# Patient Record
Sex: Female | Born: 1945
Health system: Southern US, Community
[De-identification: ages and names within clinical notes are randomized; demographics above are authoritative.]

## PROBLEM LIST (undated history)

## (undated) DIAGNOSIS — M109 Gout, unspecified: Secondary | ICD-10-CM

## (undated) DIAGNOSIS — G4730 Sleep apnea, unspecified: Secondary | ICD-10-CM

## (undated) DIAGNOSIS — R0600 Dyspnea, unspecified: Secondary | ICD-10-CM

## (undated) DIAGNOSIS — K5900 Constipation, unspecified: Secondary | ICD-10-CM

## (undated) DIAGNOSIS — M1990 Unspecified osteoarthritis, unspecified site: Secondary | ICD-10-CM

## (undated) DIAGNOSIS — I10 Essential (primary) hypertension: Secondary | ICD-10-CM

## (undated) DIAGNOSIS — K219 Gastro-esophageal reflux disease without esophagitis: Secondary | ICD-10-CM

## (undated) DIAGNOSIS — J3489 Other specified disorders of nose and nasal sinuses: Secondary | ICD-10-CM

## (undated) DIAGNOSIS — G709 Myoneural disorder, unspecified: Secondary | ICD-10-CM

## (undated) DIAGNOSIS — N189 Chronic kidney disease, unspecified: Secondary | ICD-10-CM

## (undated) HISTORY — DX: Chronic kidney disease, unspecified: N18.9

## (undated) HISTORY — PX: UVULOPALATOPHARYNGOPLASTY: SHX827

## (undated) HISTORY — PX: TONSILLECTOMY: SUR1361

## (undated) HISTORY — DX: Gastro-esophageal reflux disease without esophagitis: K21.9

## (undated) HISTORY — PX: CARPAL TUNNEL RELEASE: SHX101

## (undated) HISTORY — DX: Constipation, unspecified: K59.00

## (undated) HISTORY — PX: KNEE SURGERY: SHX244

## (undated) HISTORY — PX: ROTATOR CUFF REPAIR: SHX139

## (undated) HISTORY — PX: BACK SURGERY: SHX140

## (undated) HISTORY — PX: TRIGGER FINGER RELEASE: SHX641

---

## 1993-12-20 HISTORY — PX: ESOPHAGOGASTRODUODENOSCOPY: SHX1529

## 1993-12-20 HISTORY — PX: COLONOSCOPY: SHX174

## 2001-10-06 ENCOUNTER — Encounter: Payer: Self-pay | Admitting: Otolaryngology

## 2001-10-06 ENCOUNTER — Encounter (INDEPENDENT_AMBULATORY_CARE_PROVIDER_SITE_OTHER): Payer: Self-pay | Admitting: *Deleted

## 2001-10-06 ENCOUNTER — Ambulatory Visit (HOSPITAL_COMMUNITY): Admission: RE | Admit: 2001-10-06 | Discharge: 2001-10-07 | Payer: Self-pay | Admitting: Otolaryngology

## 2002-01-05 ENCOUNTER — Ambulatory Visit (HOSPITAL_BASED_OUTPATIENT_CLINIC_OR_DEPARTMENT_OTHER): Admission: RE | Admit: 2002-01-05 | Discharge: 2002-01-05 | Payer: Self-pay | Admitting: Otolaryngology

## 2002-06-11 ENCOUNTER — Other Ambulatory Visit: Admission: RE | Admit: 2002-06-11 | Discharge: 2002-06-11 | Payer: Self-pay | Admitting: *Deleted

## 2002-06-21 ENCOUNTER — Other Ambulatory Visit: Admission: RE | Admit: 2002-06-21 | Discharge: 2002-06-21 | Payer: Self-pay | Admitting: *Deleted

## 2002-10-24 ENCOUNTER — Other Ambulatory Visit: Admission: RE | Admit: 2002-10-24 | Discharge: 2002-10-24 | Payer: Self-pay | Admitting: *Deleted

## 2002-12-14 ENCOUNTER — Ambulatory Visit (HOSPITAL_COMMUNITY): Admission: RE | Admit: 2002-12-14 | Discharge: 2002-12-14 | Payer: Self-pay | Admitting: *Deleted

## 2002-12-14 ENCOUNTER — Encounter (INDEPENDENT_AMBULATORY_CARE_PROVIDER_SITE_OTHER): Payer: Self-pay

## 2003-06-14 ENCOUNTER — Other Ambulatory Visit: Admission: RE | Admit: 2003-06-14 | Discharge: 2003-06-14 | Payer: Self-pay | Admitting: *Deleted

## 2005-02-15 ENCOUNTER — Ambulatory Visit: Payer: Self-pay | Admitting: Internal Medicine

## 2005-02-15 ENCOUNTER — Ambulatory Visit (HOSPITAL_COMMUNITY): Admission: RE | Admit: 2005-02-15 | Discharge: 2005-02-15 | Payer: Self-pay | Admitting: Internal Medicine

## 2005-02-15 HISTORY — PX: COLONOSCOPY: SHX174

## 2005-11-10 ENCOUNTER — Other Ambulatory Visit: Admission: RE | Admit: 2005-11-10 | Discharge: 2005-11-10 | Payer: Self-pay | Admitting: Family Medicine

## 2006-02-27 ENCOUNTER — Emergency Department (HOSPITAL_COMMUNITY): Admission: EM | Admit: 2006-02-27 | Discharge: 2006-02-27 | Payer: Self-pay | Admitting: Family Medicine

## 2007-07-09 ENCOUNTER — Encounter: Admission: RE | Admit: 2007-07-09 | Discharge: 2007-07-09 | Payer: Self-pay | Admitting: Orthopaedic Surgery

## 2008-01-19 ENCOUNTER — Emergency Department (HOSPITAL_COMMUNITY): Admission: EM | Admit: 2008-01-19 | Discharge: 2008-01-19 | Payer: Self-pay | Admitting: Family Medicine

## 2008-07-23 ENCOUNTER — Ambulatory Visit (HOSPITAL_COMMUNITY): Admission: RE | Admit: 2008-07-23 | Discharge: 2008-07-23 | Payer: Self-pay | Admitting: Family Medicine

## 2008-09-10 ENCOUNTER — Encounter: Admission: RE | Admit: 2008-09-10 | Discharge: 2008-09-10 | Payer: Self-pay | Admitting: Orthopaedic Surgery

## 2009-07-02 ENCOUNTER — Ambulatory Visit (HOSPITAL_BASED_OUTPATIENT_CLINIC_OR_DEPARTMENT_OTHER): Admission: RE | Admit: 2009-07-02 | Discharge: 2009-07-02 | Payer: Self-pay | Admitting: *Deleted

## 2010-01-10 ENCOUNTER — Encounter: Admission: RE | Admit: 2010-01-10 | Discharge: 2010-01-10 | Payer: Self-pay | Admitting: Family Medicine

## 2010-01-29 ENCOUNTER — Encounter: Admission: RE | Admit: 2010-01-29 | Discharge: 2010-02-26 | Payer: Self-pay | Admitting: Neurosurgery

## 2010-06-24 DIAGNOSIS — D649 Anemia, unspecified: Secondary | ICD-10-CM | POA: Insufficient documentation

## 2010-06-25 ENCOUNTER — Ambulatory Visit: Payer: Self-pay | Admitting: Internal Medicine

## 2010-06-25 DIAGNOSIS — Z860100 Personal history of colon polyps, unspecified: Secondary | ICD-10-CM | POA: Insufficient documentation

## 2010-06-29 ENCOUNTER — Ambulatory Visit: Payer: Self-pay | Admitting: Internal Medicine

## 2010-06-29 ENCOUNTER — Encounter: Payer: Self-pay | Admitting: Gastroenterology

## 2010-07-08 ENCOUNTER — Encounter: Payer: Self-pay | Admitting: Internal Medicine

## 2010-07-10 ENCOUNTER — Telehealth (INDEPENDENT_AMBULATORY_CARE_PROVIDER_SITE_OTHER): Payer: Self-pay | Admitting: *Deleted

## 2010-07-10 ENCOUNTER — Ambulatory Visit: Payer: Self-pay | Admitting: Internal Medicine

## 2010-07-10 ENCOUNTER — Ambulatory Visit (HOSPITAL_COMMUNITY): Admission: RE | Admit: 2010-07-10 | Discharge: 2010-07-10 | Payer: Self-pay | Admitting: Internal Medicine

## 2010-07-10 HISTORY — PX: COLONOSCOPY: SHX174

## 2010-07-14 ENCOUNTER — Encounter: Payer: Self-pay | Admitting: Internal Medicine

## 2010-11-02 ENCOUNTER — Ambulatory Visit (HOSPITAL_COMMUNITY): Admission: RE | Admit: 2010-11-02 | Discharge: 2010-11-02 | Payer: Self-pay | Admitting: Family Medicine

## 2010-11-11 ENCOUNTER — Ambulatory Visit: Payer: Self-pay | Admitting: Internal Medicine

## 2010-11-11 ENCOUNTER — Encounter: Payer: Self-pay | Admitting: Gastroenterology

## 2010-11-11 DIAGNOSIS — R740 Nonspecific elevation of levels of transaminase and lactic acid dehydrogenase [LDH]: Secondary | ICD-10-CM

## 2010-11-11 DIAGNOSIS — R1011 Right upper quadrant pain: Secondary | ICD-10-CM | POA: Insufficient documentation

## 2010-11-11 DIAGNOSIS — K760 Fatty (change of) liver, not elsewhere classified: Secondary | ICD-10-CM | POA: Insufficient documentation

## 2010-11-11 DIAGNOSIS — R7401 Elevation of levels of liver transaminase levels: Secondary | ICD-10-CM | POA: Insufficient documentation

## 2010-11-11 DIAGNOSIS — R109 Unspecified abdominal pain: Secondary | ICD-10-CM | POA: Insufficient documentation

## 2010-11-13 ENCOUNTER — Ambulatory Visit (HOSPITAL_COMMUNITY): Admission: RE | Admit: 2010-11-13 | Discharge: 2010-11-13 | Payer: Self-pay | Admitting: Internal Medicine

## 2010-11-16 ENCOUNTER — Encounter: Payer: Self-pay | Admitting: Internal Medicine

## 2010-11-17 ENCOUNTER — Encounter: Payer: Self-pay | Admitting: Gastroenterology

## 2010-11-17 LAB — CONVERTED CEMR LAB
AST: 54 units/L — ABNORMAL HIGH (ref 0–37)
Albumin: 4.2 g/dL (ref 3.5–5.2)
Alkaline Phosphatase: 65 units/L (ref 39–117)
Eosinophils Relative: 4 % (ref 0–5)
HCT: 38.8 % (ref 36.0–46.0)
Hemoglobin: 12.6 g/dL (ref 12.0–15.0)
Indirect Bilirubin: 0.2 mg/dL (ref 0.0–0.9)
Lymphocytes Relative: 31 % (ref 12–46)
Lymphs Abs: 2 10*3/uL (ref 0.7–4.0)
Platelets: 397 10*3/uL (ref 150–400)
Saturation Ratios: 30 % (ref 20–55)
Total Bilirubin: 0.3 mg/dL (ref 0.3–1.2)
WBC: 6.4 10*3/uL (ref 4.0–10.5)

## 2010-11-18 ENCOUNTER — Encounter: Payer: Self-pay | Admitting: Internal Medicine

## 2010-11-24 ENCOUNTER — Encounter (HOSPITAL_COMMUNITY)
Admission: RE | Admit: 2010-11-24 | Discharge: 2010-12-24 | Payer: Self-pay | Source: Home / Self Care | Attending: Internal Medicine | Admitting: Internal Medicine

## 2011-01-08 ENCOUNTER — Encounter
Admission: RE | Admit: 2011-01-08 | Discharge: 2011-01-08 | Payer: Self-pay | Source: Home / Self Care | Attending: Orthopaedic Surgery | Admitting: Orthopaedic Surgery

## 2011-01-19 NOTE — Progress Notes (Signed)
Summary: call cell number  Phone Note From Other Clinic   Summary of Call: Pamala Hurry from Short Stay called to let us know to call pt on her cell number [phone] when her results come back. Initial call taken by: Zeb Comfort,  July 10, 2010 3:25 PM

## 2011-01-19 NOTE — Letter (Signed)
Summary: Patient Notice, Colon Biopsy Results  [facility]  [address]   [place], [place] [address]   Phone: [phone]  Fax: [phone]       July 14, 2010   [name] [id number] [name] [place], [place]  [address] [birth year]    Dear Ms. Cheryl [name],  I am pleased to inform you that the biopsies taken during your recent colonoscopy did not show any evidence of cancer upon pathologic examination.  Additional information/recommendations:  No further action is needed at this time.  Please follow-up with your primary care physician for your other healthcare needs.  You should have a repeat colonoscopy examination  in 5 years.  Please call us if you are having persistent problems or have questions about your condition that have not been fully answered at this time.  Sincerely,    R. Garfield Cornea MD, [facility] Ph: [phone]    Fax: [phone]   Appended Document: Patient Notice, Colon Biopsy Results Letter mailed to pt.  Appended Document: Patient Notice, Colon Biopsy Results reminder in computer

## 2011-01-19 NOTE — Letter (Signed)
Summary: RAD REPORT [place] ABD COM  RAD REPORT [place] ABD COM   Imported By: Hoy Morn 11/16/2010 16:03:10  _____________________________________________________________________  External Attachment:    Type:   Image     Comment:   External Document

## 2011-01-19 NOTE — Miscellaneous (Signed)
Summary: Orders Update  Clinical Lists Changes  Orders: Added new Test order of T-Hepatic Function ([id number]) - Signed 

## 2011-01-19 NOTE — Assessment & Plan Note (Signed)
Summary: DROPPED OFF STOOL/SS  pt returned ifobt, it was positive. She stated that she has not had a good bowel movement in a couple of days and her hemorroids are bleeding right now.  Allergies: 1)  ! Asa 2)  ! Celebrex 3)  ! * Prednisone /for Extended Period of Time  Appended Document: Orders Update    Clinical Lists Changes  Orders: Added new Service order of Immuno-chemical Fecal Occult [id number]) - Signed      Appended Document: DROPPED OFF STOOL/SS schedule tcs/hemocult positive  Appended Document: DROPPED OFF STOOL/SS TCS scheduled for 07/10/10- pt aware - cdg

## 2011-01-19 NOTE — Letter (Signed)
Summary: TCS Orders   TCS Orders   Imported By: Sherry Ruffing 07/08/2010 11:58:12  _____________________________________________________________________  External Attachment:    Type:   Image     Comment:   External Document

## 2011-01-19 NOTE — Letter (Signed)
Summary: referral from dr Orson Ape  referral from dr Orson Ape   Imported By: Orma Flaming 06/29/2010 15:23:35  _____________________________________________________________________  External Attachment:    Type:   Image     Comment:   External Document

## 2011-01-19 NOTE — Assessment & Plan Note (Signed)
Summary: CONSULT FOR TCS,HX OF POLYPS,HEMORRHOIDS/SS   Visit Type:  Initial Consult Referring Provider:  Smitty Knudsen Primary Care Provider:  Smitty Knudsen  Chief Complaint:  hemmorhoids/ TCS.  History of Present Illness: Cheryl [name] is a pleasant 65 y/o AA female, patient of [employer], who presents for further f/u TCS. She was advised by PCP that it was time for f/u TCS due to her h/o polyps. Dr. Laural Golden performed last TCS in 2006. Patient had several hyperplastic polyps. F/U planned for 2016 based on pathology of the polyps.   Patient denies constipation, melena, brbpr, abd pain, n/v, weight loss, dysphagia. Heartburn well-controlled.   Recent Hgb 11.6. Heme negative X 3.   Current Medications (verified): 1)  Calcium .... Take 1 Tablet By Mouth Once A Day 2)  Evista .... Take 1 Tablet By Mouth Once A Day 3)  Omeprazole .... Take 1 Tablet By Mouth Once A Day 4)  Biotin .... Take 1 Tablet By Mouth Once A Day 5)  Hydrocodone .... As Needed  Allergies (verified): 1)  ! Asa 2)  ! Celebrex 3)  ! * Prednisone /for Extended Period of Time  Past History:  Past Medical History: H/O colonic polyps. TCS 3/06, Dr. Laural Golden small ext hemorrhoids, several hyperplastic polyps. Advised for TCS 2016. EGD/TCS 1995--> Three tiny polyps, path showed chronic colitis, gastritis Anemia, mild  Hypothyroidism GERD Hepatitis, in seventh grade. Subsequent test "okay" per patient.  Past Surgical History: Right carpel tunnel release and trigger finger.  Trigger finger, thumb Rotator cuff  Two knee surgeries Tubal Ligation  Family History: No FH of CRC, colon polyps.  Social History: Married. Two children. 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   Review of Systems General:  Denies fever, chills, sweats, anorexia, fatigue, weakness, and weight loss. Eyes:  Denies vision loss. ENT:  Denies nasal congestion, sore throat, hoarseness, and difficulty swallowing. CV:  Denies  chest pains, angina, palpitations, dyspnea on exertion, and peripheral edema. Resp:  Denies dyspnea at rest, dyspnea with exercise, cough, sputum, and wheezing. GI:  See HPI. GU:  Denies urinary burning and blood in urine. MS:  right trigger finger and carpel tunnel surgery on tuesday. Derm:  Denies rash and itching. Neuro:  Denies weakness, frequent headaches, memory loss, and confusion. Psych:  Denies depression and anxiety. Endo:  Denies unusual weight change. Heme:  Denies bruising and bleeding. Allergy:  Denies hives and rash.  Vital Signs:  Patient profile:   65 year old female Height:      59 inches Weight:      238 pounds BMI:     48.24 Temp:     98.3 degrees F oral Pulse rate:   80 / minute BP sitting:   148 / 82  (left arm) Cuff size:   large  Vitals Entered By: Cheryl Merl LPN (July  7, 624THL QA348G PM)  Physical Exam  General:  Well developed, well nourished, no acute distress.obese.   Head:  Normocephalic and atraumatic. Eyes:  sclera nonicteric Mouth:  Oropharyngeal mucosa moist, pink.  No lesions, erythema or exudate.    Lungs:  Clear throughout to auscultation. Heart:  Regular rate and rhythm; no murmurs, rubs,  or bruits. Abdomen:  Bowel sounds normal.  Abdomen is soft, nontender, nondistended.  No rebound or guarding.  No hepatosplenomegaly, masses or hernias.  No abdominal bruits. obese.   Extremities:  No LE edema. Neurologic:  Alert and  oriented x4;  grossly normal neurologically. Skin:  Intact without  significant lesions or rashes. Cervical Nodes:  No significant cervical adenopathy. Psych:  Alert and cooperative. Normal mood and affect.  Impression & Recommendations:  Problem # 1:  COLONIC POLYPS, HYPERPLASTIC, HX OF (ICD-V12.72)  No need for five year f/u colonoscopy for hyperplastic polyps. No FH of CRC or colon polyps. She has mild anemia. Heme neg X 3. Patient in agreement if no colonoscopy needed right now.   Recommend ifobt. If positive, then  TCS now.  Will discuss above with Dr. Gala Romney.  Orders: Consultation Level III [id number]) I would like to thank [facility] for allowing [place] to take part in the care of this nice patient.

## 2011-01-19 NOTE — Letter (Signed)
Summary: RAD ORDER CT W/IV & ORAL CM  RAD ORDER CT W/IV & ORAL CM   Imported By: Hoy Morn 11/11/2010 11:41:03  _____________________________________________________________________  External Attachment:    Type:   Image     Comment:   External Document  Appended Document: RAD ORDER CT W/IV & ORAL CM [id number]

## 2011-01-19 NOTE — Letter (Signed)
Summary: REFERRAL FROM [place] FROM [facility]   Imported By: Hoy Morn 11/16/2010 16:01:43  _____________________________________________________________________  External Attachment:    Type:   Image     Comment:   External Document

## 2011-01-19 NOTE — Letter (Signed)
Summary: HIDA SCAN ORDER  HIDA SCAN ORDER   Imported By: Sofie Rower 11/18/2010 09:17:37  _____________________________________________________________________  External Attachment:    Type:   Image     Comment:   External Document

## 2011-01-20 ENCOUNTER — Encounter (INDEPENDENT_AMBULATORY_CARE_PROVIDER_SITE_OTHER): Payer: Self-pay | Admitting: *Deleted

## 2011-01-21 NOTE — Assessment & Plan Note (Signed)
Summary: abd pain,fatty liver/enlarged liver/ss   Visit Type:  Initial Consult Referring Provider:  Delman [name] Primary Care Provider:  Delman Cheadle, [name]  CC:  abd pain, fatty liver, and enlarged liver.  History of Present Illness: Cheryl [name] is here for further evaluation of RUQ pain, enlarged liver/fatty liver, abnormal LFTs at request of her PCP, Cheryl [name].   She was last seen over the summer for TCS. She had heme positive stool and mild anemia. See PMH for details of TCS.  C/O RUQ pain since 8/11 but worse in the last month. Pain over RUQ with U/S. Intermittent sharp pain and also dull pain at times. Not related to meals. Sometimes wakes up at night. BM regular. No melena, brbpr. No heartburn, n/v. Takes Hydrocodone once every two to three weeks. Takes for carpel tunnel. Gives h/o bulging disc in mid to low back.  Labs 10/24/10: BUN 14, Cre 0.92, T bili 0.3, AP 69, AST 49H, ALT 29, alb 4.6, lipase 27 Abd U/S 01/02/10: normal gb, CBD 29mm, Hepatomegaly at 19.9cm, increased echogenicity of the liver. Fatty liver.   Current Medications (verified): 1)  Calcium .... Take 1 Tablet By Mouth Once A Day 2)  Evista .... Take 1 Tablet By Mouth Once A Day 3)  Omeprazole .... Take 1 Tablet By Mouth Once A Day 4)  Biotin .... Take 1 Tablet By Mouth Once A Day 5)  Hydrocodone .... As Needed  Allergies (verified): 1)  ! Asa 2)  ! Celebrex 3)  ! * Prednisone /for Extended Period of Time  Past History:  Past Surgical History: Last updated: 06/25/2010 Right carpel tunnel release and trigger finger.  Trigger finger, thumb Rotator cuff  Two knee surgeries Tubal Ligation  Social History: Last updated: 06/25/2010 Married. Two children. 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   Past Medical History: H/O colonic polyps. TCS 3/06, Cheryl [name] small ext hemorrhoids, several hyperplastic polyps. Advised for TCS 2016. EGD/TCS 1995--> Three tiny polyps, path showed  chronic colitis, gastritis Anemia, mild  Hypothyroidism GERD Hepatitis, in seventh grade. Subsequent test "okay" per patient. TCS 7/11-->Normal rectum. Long redundant colon, polyps in the sigmoid, descending and hepatic flexure segments ablated, biopsied (adenomatous)/ removed or snare removed. Next TCS due 06/2015.   Family History: No FH of CRC, colon polyps. 2 sisters with DM No FH of liver disease.   Review of Systems General:  Denies fever, chills, sweats, anorexia, fatigue, weakness, and weight loss. Eyes:  Denies vision loss. ENT:  Denies nasal congestion, sore throat, hoarseness, and difficulty swallowing. CV:  Denies chest pains, angina, palpitations, dyspnea on exertion, and peripheral edema. Resp:  Denies dyspnea at rest, dyspnea with exercise, cough, and sputum. GI:  See HPI. GU:  Denies urinary burning and blood in urine. MS:  Complains of low back pain; denies joint pain / LOM. Derm:  Denies rash and itching. Neuro:  Denies weakness, frequent headaches, memory loss, and confusion. Psych:  Denies depression and anxiety. Endo:  Denies unusual weight change. Heme:  Denies bruising and bleeding. Allergy:  Denies hives and rash.  Vital Signs:  Patient profile:   65 year old female Height:      59 inches Weight:      239 pounds BMI:     48.45 Temp:     98.8 degrees F oral Pulse rate:   64 / minute BP sitting:   138 / 90  (left arm) Cuff size:   large  Vitals Entered By: Cheryl Merl  LPN (November 23, 624THL 10:44 AM)  Physical Exam  General:  Well developed, well nourished, no acute distress.obese.   Head:  Normocephalic and atraumatic. Eyes:  sclera nonicteric Mouth:  op moist Neck:  Supple; no masses or thyromegaly. Lungs:  Clear throughout to auscultation. Heart:  Regular rate and rhythm; no murmurs, rubs,  or bruits. Abdomen:  Obese. Soft. Positive BS. Mild tenderness in RUQ extending to right mid-abd and right flank. Also with pain on palpation of right  rib cage. No CVA tenderness. No rash.  Extremities:  No clubbing, cyanosis, edema or deformities noted. Neurologic:  Alert and  oriented x4;  grossly normal neurologically. Skin:  Intact without significant lesions or rashes. Cervical Nodes:  No significant cervical adenopathy. Psych:  Alert and cooperative. Normal mood and affect.  Impression & Recommendations:  Problem # 1:  RUQ PAIN (ICD-789.01) RUQ pain unlikely related to hepatomegaly or elevated LFTs. Really do not get postprandial component making biliary etiology unlikely as well. I suspect this will be more musculoskeletal in nature. Having said that, will check labs and CT A/P with IV/oral contrast. If CT neg, consider HIDA prior to w/u of musculoskeletal cause.  Orders: T-CBC w/Diff [id number]) T-Hepatic Function [phone]) T-Ferritin [phone]) T-Hepatitis B Surface Antigen [id number]) T-Hepatitis C Antibody [id number]) Consultation Level III [id number])  Problem # 2:  FATTY LIVER DISEASE (ICD-571.8)  Fatty liver and hepatomegaly with mildly elevated AST. Discussed need for slow gradual weight loss and anaerobic exercise. Further w/u as in #1. If all negative, then will provide patient with more information regarding fatty liver and recheck her lfts in 6 months.  Orders: Consultation Level III [phone])  Other Orders: T-Iron [phone]) T-Iron Binding Capacity (TIBC) ([id number]) I would like to thank Dr. Hilma Favors for allowing [place] to take part in the care of this nice patient.

## 2011-01-27 ENCOUNTER — Ambulatory Visit: Payer: BC Managed Care – PPO | Attending: Orthopaedic Surgery | Admitting: Physical Therapy

## 2011-01-27 DIAGNOSIS — M25519 Pain in unspecified shoulder: Secondary | ICD-10-CM | POA: Insufficient documentation

## 2011-01-27 DIAGNOSIS — M25619 Stiffness of unspecified shoulder, not elsewhere classified: Secondary | ICD-10-CM | POA: Insufficient documentation

## 2011-01-27 DIAGNOSIS — IMO0001 Reserved for inherently not codable concepts without codable children: Secondary | ICD-10-CM | POA: Insufficient documentation

## 2011-01-27 NOTE — Letter (Signed)
Summary: Recall Office Visit  [facility]  [address]   [place], [place] [address]   Phone: [phone]  Fax: [phone]      January 20, 2011   Cheryl [name] [id number] [name] [place], [place]  [address] Jan 24, 1946   Dear [name],   According to our records, it is time for you to schedule a follow-up office visit with [place].   At your convenience, please call [phone] to schedule an office visit. If you have any questions, concerns, or feel that this letter is in error, we would appreciate your call.   Sincerely,    [facility] Ph: [phone]   Fax: [phone]

## 2011-02-01 ENCOUNTER — Ambulatory Visit: Payer: BC Managed Care – PPO | Admitting: Physical Therapy

## 2011-02-03 ENCOUNTER — Ambulatory Visit: Payer: BC Managed Care – PPO | Admitting: Physical Therapy

## 2011-02-08 ENCOUNTER — Ambulatory Visit: Payer: BC Managed Care – PPO | Admitting: Physical Therapy

## 2011-02-10 ENCOUNTER — Ambulatory Visit: Payer: BC Managed Care – PPO | Admitting: Physical Therapy

## 2011-02-15 ENCOUNTER — Ambulatory Visit: Payer: BC Managed Care – PPO | Admitting: Physical Therapy

## 2011-02-17 ENCOUNTER — Ambulatory Visit: Payer: BC Managed Care – PPO | Admitting: Physical Therapy

## 2011-02-22 ENCOUNTER — Ambulatory Visit: Payer: BC Managed Care – PPO | Attending: Family Medicine | Admitting: Physical Therapy

## 2011-02-22 DIAGNOSIS — IMO0001 Reserved for inherently not codable concepts without codable children: Secondary | ICD-10-CM | POA: Insufficient documentation

## 2011-02-22 DIAGNOSIS — M25619 Stiffness of unspecified shoulder, not elsewhere classified: Secondary | ICD-10-CM | POA: Insufficient documentation

## 2011-02-22 DIAGNOSIS — M25519 Pain in unspecified shoulder: Secondary | ICD-10-CM | POA: Insufficient documentation

## 2011-02-25 ENCOUNTER — Ambulatory Visit: Payer: BC Managed Care – PPO | Admitting: Physical Therapy

## 2011-03-01 ENCOUNTER — Ambulatory Visit: Payer: BC Managed Care – PPO | Admitting: Physical Therapy

## 2011-03-03 ENCOUNTER — Ambulatory Visit: Payer: BC Managed Care – PPO | Admitting: Physical Therapy

## 2011-03-08 ENCOUNTER — Encounter: Payer: BC Managed Care – PPO | Admitting: Physical Therapy

## 2011-03-08 ENCOUNTER — Ambulatory Visit: Payer: BC Managed Care – PPO | Admitting: Physical Therapy

## 2011-03-10 ENCOUNTER — Ambulatory Visit: Payer: BC Managed Care – PPO | Admitting: Physical Therapy

## 2011-03-15 ENCOUNTER — Encounter: Payer: BC Managed Care – PPO | Admitting: Physical Therapy

## 2011-03-15 ENCOUNTER — Ambulatory Visit: Payer: BC Managed Care – PPO | Admitting: Physical Therapy

## 2011-03-17 ENCOUNTER — Ambulatory Visit: Payer: BC Managed Care – PPO | Admitting: Physical Therapy

## 2011-03-22 ENCOUNTER — Ambulatory Visit: Payer: BC Managed Care – PPO | Attending: Orthopaedic Surgery | Admitting: Physical Therapy

## 2011-03-22 DIAGNOSIS — M25619 Stiffness of unspecified shoulder, not elsewhere classified: Secondary | ICD-10-CM | POA: Insufficient documentation

## 2011-03-22 DIAGNOSIS — IMO0001 Reserved for inherently not codable concepts without codable children: Secondary | ICD-10-CM | POA: Insufficient documentation

## 2011-03-22 DIAGNOSIS — M25519 Pain in unspecified shoulder: Secondary | ICD-10-CM | POA: Insufficient documentation

## 2011-03-24 ENCOUNTER — Ambulatory Visit: Payer: BC Managed Care – PPO | Admitting: Physical Therapy

## 2011-03-28 LAB — POCT HEMOGLOBIN-HEMACUE: Hemoglobin: 12.6 g/dL (ref 12.0–15.0)

## 2011-03-29 ENCOUNTER — Ambulatory Visit: Payer: BC Managed Care – PPO | Admitting: Physical Therapy

## 2011-03-31 ENCOUNTER — Ambulatory Visit: Payer: BC Managed Care – PPO | Admitting: Physical Therapy

## 2011-04-05 ENCOUNTER — Encounter: Payer: BC Managed Care – PPO | Admitting: Physical Therapy

## 2011-04-05 ENCOUNTER — Ambulatory Visit: Payer: BC Managed Care – PPO | Admitting: Physical Therapy

## 2011-04-07 ENCOUNTER — Ambulatory Visit: Payer: BC Managed Care – PPO | Admitting: Physical Therapy

## 2011-04-12 ENCOUNTER — Encounter: Payer: BC Managed Care – PPO | Admitting: Physical Therapy

## 2011-04-13 ENCOUNTER — Ambulatory Visit: Payer: BC Managed Care – PPO | Admitting: Physical Therapy

## 2011-04-14 ENCOUNTER — Encounter: Payer: BC Managed Care – PPO | Admitting: Physical Therapy

## 2011-04-14 ENCOUNTER — Ambulatory Visit: Payer: BC Managed Care – PPO | Admitting: Physical Therapy

## 2011-04-20 ENCOUNTER — Ambulatory Visit: Payer: BC Managed Care – PPO | Attending: Orthopaedic Surgery | Admitting: Physical Therapy

## 2011-04-20 DIAGNOSIS — M25619 Stiffness of unspecified shoulder, not elsewhere classified: Secondary | ICD-10-CM | POA: Insufficient documentation

## 2011-04-20 DIAGNOSIS — IMO0001 Reserved for inherently not codable concepts without codable children: Secondary | ICD-10-CM | POA: Insufficient documentation

## 2011-04-20 DIAGNOSIS — M25519 Pain in unspecified shoulder: Secondary | ICD-10-CM | POA: Insufficient documentation

## 2011-04-21 ENCOUNTER — Ambulatory Visit: Payer: BC Managed Care – PPO | Admitting: Physical Therapy

## 2011-04-26 ENCOUNTER — Ambulatory Visit: Payer: BC Managed Care – PPO | Admitting: Physical Therapy

## 2011-04-28 ENCOUNTER — Ambulatory Visit: Payer: BC Managed Care – PPO | Admitting: Physical Therapy

## 2011-05-03 ENCOUNTER — Ambulatory Visit: Payer: BC Managed Care – PPO | Admitting: Physical Therapy

## 2011-05-04 NOTE — Op Note (Signed)
NAMEHASET, MONACHINO NO.:  [id number]   MEDICAL RECORD NO.:  [id number]          PATIENT TYPE:  AMB   LOCATION:  [place]                          FACILITY:  [facility]   PHYSICIAN:  Daryl Eastern, M.D.DATE OF BIRTH:  1946-08-27   DATE OF PROCEDURE:  07/02/2009  DATE OF DISCHARGE:                               OPERATIVE REPORT   PREOPERATIVE DIAGNOSIS:  Right trigger thumb.   POSTOPERATIVE DIAGNOSIS:  Right trigger thumb.   PROCEDURE:  Release of A1 pulley, right thumb.   SURGEON:  Daryl Eastern, MD   ANESTHESIA:  Marcaine 0.5% local with sedation.   OPERATIVE FINDINGS:  The patient had a nodule in the FPL tendon beneath  the A1 pulley.   PROCEDURE IN DETAIL:  Under 0.5% Marcaine local anesthesia with the  tourniquet on the right arm, the right hand was prepped and draped in  usual fashion.  After exsanguinating the limb, the tourniquet was  inflated to 250 mmHg.  A transverse incision was made over the volar  aspect of the MP joint of the right thumb and carried down through the  subcutaneous tissues.  Blunt dissection was carried down in the midline  to the flexor tendon sheath.  Care was taken to protect the digital  nerves and they were retracted radially and ulnarly.  The A1 pulley was  then incised sharply with a knife and completely released with the  scissors.  The thumb was flexed and there was no further triggering.  There did appear to be nodular formation in the tendon.  The wound was  irrigated copiously with saline.  The skin was closed with 4-0 nylon  sutures.  Sterile dressings were applied.  The patient had the  tourniquet released with good circulation of the hand.  She went to the  recovery room awake in stable and good condition.      Daryl Eastern, M.D.  Electronically Signed     EMM/MEDQ  D:  07/02/2009  T:  07/03/2009  Job:  [id number]

## 2011-05-05 ENCOUNTER — Ambulatory Visit: Payer: BC Managed Care – PPO | Admitting: Physical Therapy

## 2011-05-07 NOTE — Op Note (Signed)
[place]  Patient:    GEOFFREY, NEDVED Visit Number: [id number] MRN: [id number]          Service Type: DSU Location: [place] [facility] Attending Physician:  Silvestre Moment Dictated by:   Silvestre Moment, M.D. Proc. Date: 10/06/01 Admit Date:  10/06/2001   CC:         Zella Richer. Alva Garnet, M.D. [facility]   Operative Report  PREOPERATIVE DIAGNOSIS:  Obstructive sleep apnea.  POSTOPERATIVE DIAGNOSIS:  Obstructive sleep apnea.  OPERATION PERFORMED:  Tonsillectomy, uvulopalatopharyngoplasty.  SURGEON:  Silvestre Moment, M.D.  ANESTHESIA:  General endotracheal.  ESTIMATED BLOOD LOSS:  20 cc.  SPECIMENS:  None.  COMPLICATIONS:  None.  INDICATIONS FOR PROCEDURE:  This patient is a 65 year old female who has obstructive sleep apnea with an RDI of 24.3.  She has had difficulty tolerating C-PAP machine.  For this reason, the initial approach to treatment is performed today.  The patient has been noted to have a prominent tongue base which will be addressed at another time.  FINDINGS:  The patient was noted to have redundant soft palate with 2+ bilateral palatine tonsils.  DESCRIPTION OF PROCEDURE:  The patient was taken to the operating room on the table in supine position.  She was then placed under general endotracheal anesthesia and the table rotated counterclockwise 90 degrees.  Bacitracin ointment was placed on the lips.  Eyes were taped shut and head and body draped in the usual fashion.  A Crowe-Davis mouth gag with a #4 tongue blade was then placed intraorally, opened and suspended on the Mayo stand. Palpation in the soft palate revealed the area of the levator dimple and a mark was made using a Harmonic scalpel.  Both the palatine tonsils were then removed using a Harmonic scalpel in a variable mode on a setting of level 3/5. Point hemostasis was achieved in the right tonsillar fossa with suction cautery.  At this point the notches were made in the anterior  tonsillar pillars, laterally.  The uvula was grasped with forceps and superior to the base of the insertion of the uvula approximately 5 mm, this portion of the soft palate and uvula were then resected using the Harmonic scalpel.  The anterior and posterior mucosal surfaces were then reapproximated in a simple interrupted fashion using 4-0 Vicryl.  Then the anterior and posterior tonsillar pillars were also approximated in an identical fashion.  An NG tube was placed down the esophagus for suctioning of the gastric contents.  The mouth gag was removed noting no damage to the teeth or soft tissues.  The patient was awakened from anesthesia and taken to the post anesthesia care unit in stable condition.  No complications. Dictated by:   Silvestre Moment, M.D. Attending Physician:  Silvestre Moment DD:  10/06/01 TD:  10/09/01 Job: [id number]

## 2011-05-07 NOTE — Op Note (Signed)
NAMEJASA, BUCEK                 ACCOUNT NO.:  [id number]   MEDICAL RECORD NO.:  [id number]          PATIENT TYPE:  AMB   LOCATION:  [place]                           FACILITY:  [facility]   PHYSICIAN:  Hildred Laser, M.D.    DATE OF BIRTH:  [birth year]   DATE OF PROCEDURE:  02/15/2005  DATE OF DISCHARGE:                                 OPERATIVE REPORT   PROCEDURE:  Colonoscopy.   INDICATIONS:  Cheryl [name] is a 65 year old Afro-American female who is here for  screening colonoscopy.  Her family history is negative for colorectal  carcinoma.  Procedure risks were reviewed with the patient and informed  consent was obtained.   PREMEDICATION:  Demerol 25 mg IV, Versed 4 mg IV.   FINDINGS:  Procedure formed in endoscopy suite.  The patient's vital signs  and O2 saturation were monitored during procedure and remained stable.  The  patient was placed in the left lateral recumbent position.  Rectal examination performed.  No abnormality noted, external or digital  exam.  Olympus video scope was placed in the rectum and advanced under  vision into sigmoid colon and beyond.  Preparation was satisfactory.  Redundant colon.  Scope was passed cecum, which was identified by  appendiceal orifice and ileocecal valve.  Pictures taken for the record.  As  the scope was withdrawn, colonic mucosa was carefully examined.  There was a  small polyp at distal transverse colon, which was ablated via cold biopsy.  Mucosa of the rest of the colon was normal.  There were two small polyps at  rectum that were ablated via cold biopsy.  Scope was retroflexed to examine  anorectal junction and small hemorrhoids were noted below the dentate line.  Endoscope was straightened and withdrawn.  The patient tolerated the  procedure well.   FINAL DIAGNOSES:  1.  Examination performed to cecum.  2.  Three small polyps ablated via cold biopsy, one from transverse colon      and two from the rectum.  3.  Small external  hemorrhoids.   RECOMMENDATIONS:  Standard instructions given. I will be contacting the  patient with biopsy results and further recommendations.      NR/MEDQ  D:  02/15/2005  T:  02/15/2005  Job:  [id number]   cc:   Halford Chessman, M.D.  Fax: [phone]

## 2011-05-07 NOTE — H&P (Signed)
NAME:  Cheryl [name], Cheryl [name]                           ACCOUNT NO.:  [id number]   MEDICAL RECORD NO.:  [id number]                   PATIENT TYPE:  AMB   LOCATION:  [place]                                  FACILITY:  [facility]   PHYSICIAN:  [place] B. Rosana Hoes, M.D.               DATE OF BIRTH:  1946/09/11   DATE OF ADMISSION:  12/14/2002  DATE OF DISCHARGE:                                HISTORY & PHYSICAL   PREOPERATIVE DIAGNOSES:  Cervical dysplasia, CIN III on endocervical  curettage.   INTENDED PROCEDURE:  Cold knife conization of the cervix.   HISTORY OF PRESENT ILLNESS:  A 65 year old African-American female gravida  2, para 2 with a history of abnormal Pap smear.  This began in June 2003  with low grade SIL Pap.  Subsequent colposcopically directed biopsies showed  condyloma and CIN I.  The patient returned in November 2003 for repeat Pap  which again showed low grade SIL.  Repeat colposcopy and biopsy showed high  grade squamous dysplasia on the endocervical curettage and cervical biopsy  showing CIN I.  The patient therefore presents for cold knife conization.   PAST MEDICAL HISTORY:  1. Reflux.  2. Arthritis.   PAST SURGICAL HISTORY:  1. Knee surgery.  2. Right ankle surgery.   MEDICATIONS:  1. Xenical.  2. Nexium.  3. Prilosec.  4. Multivitamin.   ALLERGIES:  ASPIRIN causes GI upset.   SOCIAL HISTORY:  Five to six cigarettes per day smoker.  No alcohol or other  drugs.   FAMILY HISTORY:  Diabetes.   REVIEW OF SYSTEMS:  Otherwise noncontributory.   PHYSICAL EXAMINATION:  VITAL SIGNS:  Blood pressure 118/74, pulse 72, weight  188.  GENERAL:  Alert, oriented, in no acute distress.  SKIN:  Warm without lesions.  HEART:  Regular rate and rhythm.  LUNGS:  Clear to auscultation.  ABDOMEN:  Liver, spleen normal.  No hernia.  PELVIC:  Normal external genitalia.  Vagina normal.  Cervix has multiple  nabothian cysts and previous colposcopic findings as outlined above.  Uterus  is  normal size, nontender.  No adnexal masses or tenderness.  Also noted to  have perianal condyloma.  Rectal examination shows no abnormalities.   ASSESSMENT:  Cervical dysplasia, CIN I on colposcopically directed biopsy.  However, high grade dysplasia on endocervical curettage.  Therefore,  presents for cold knife conization of the cervix.  Operative risks discussed  including infection and bleeding, damage to surrounding organs.  All  questions answered.  The patient wished to proceed.                                               [place] B. Rosana Hoes, M.D.    WBD/MEDQ  D:  12/11/2002  T:  12/11/2002  Job:  [id number]  

## 2011-05-07 NOTE — Op Note (Signed)
NAME:  Cheryl [name], Cheryl [name]                           ACCOUNT NO.:  [id number]   MEDICAL RECORD NO.:  [id number]                   PATIENT TYPE:  AMB   LOCATION:  [facility]                                  FACILITY:  [facility]   PHYSICIAN:  [place] B. Rosana Hoes, M.D.               DATE OF BIRTH:  May 27, 1946   DATE OF PROCEDURE:  12/14/2002  DATE OF DISCHARGE:                                 OPERATIVE REPORT   PREOPERATIVE DIAGNOSES:  1. Severe dysplasia.  2. Vulvar lesion.   POSTOPERATIVE DIAGNOSES:  1. Severe dysplasia.  2. Vulvar lesion.   PROCEDURE:  1. Cold knife conization of the cervix.  2. Endocervical curettage.  3. Vulvar biopsy.   SURGEON:  Blair Dolphin. Rosana Hoes, M.D.   ANESTHESIA:  MAC and 20 cc 1% lidocaine with epinephrine paracervical block,  5 cc 1% lidocaine local in the perineum for vulvar biopsy.   ESTIMATED BLOOD LOSS:  50 cc.   SPECIMENS:  Cervical cone biopsy, endocervical curettage, vulvar biopsy.   COMPLICATIONS:  None.   INDICATIONS:  The patient with history of low grade Pap, CIN I on biopsy,  high grade SIL on endocervical curettage, pruritic vulvar lesion.   PROCEDURE:  The patient taken to the operating room and MAC anesthesia  obtained.  She was placed in the dorsal lithotomy position and the perineum  prepped and draped in a standard fashion.  The vagina was gently prepped  with Betadine.  The bladder was emptied with a red rubber catheter.  A  weighted speculum was inserted and Deaver retractor placed for the bladder.  Cervix was painted with Lugol solution and washed with acetic acid.  An area  in the central portion previously noted on colposcopy did not pick up the  Lugol stain.  Paracervical block placed in a standard fashion.  Angle  sutures placed at 3 and 9 o'clock at the lateral aspect of the cervix on  each side.  A cone shaped specimen was then excised from the cervix with the  number 11 blade, the base transected with heavy scissors.  Endocervical  curettage done above the cone bed.  Hemostasis obtained with ball cautery,  Monsel's, and Gelfoam insert.   Attention was then turned to the perineum.  A 4 mm lesion was noted in the  midline of the perineum which patient had described as an itchy area.  This  was infiltrated with 5 cc 1% lidocaine and excised sharply with the knife.  The skin was reapproximated with running 4-0 Vicryl.   The patient tolerated procedure well.  There were no complications.  She was  taken to recovery room awake, alert, in stable condition.  [place] B. Rosana Hoes, M.D.    WBD/MEDQ  D:  12/14/2002  T:  12/14/2002  Job:  [id number]

## 2011-05-10 ENCOUNTER — Ambulatory Visit: Payer: BC Managed Care – PPO | Admitting: Physical Therapy

## 2011-05-11 ENCOUNTER — Encounter: Payer: BC Managed Care – PPO | Admitting: Physical Therapy

## 2011-05-12 ENCOUNTER — Encounter: Payer: BC Managed Care – PPO | Admitting: Physical Therapy

## 2011-05-12 ENCOUNTER — Ambulatory Visit: Payer: BC Managed Care – PPO | Admitting: Physical Therapy

## 2012-02-18 ENCOUNTER — Emergency Department (HOSPITAL_COMMUNITY)
Admission: EM | Admit: 2012-02-18 | Discharge: 2012-02-19 | Disposition: A | Discharge status: Home / Self Care | Payer: BC Managed Care – PPO | Attending: Emergency Medicine | Admitting: Emergency Medicine

## 2012-02-18 ENCOUNTER — Encounter (HOSPITAL_COMMUNITY): Payer: Self-pay | Admitting: *Deleted

## 2012-02-18 DIAGNOSIS — M549 Dorsalgia, unspecified: Secondary | ICD-10-CM | POA: Insufficient documentation

## 2012-02-18 DIAGNOSIS — R109 Unspecified abdominal pain: Secondary | ICD-10-CM | POA: Insufficient documentation

## 2012-02-18 DIAGNOSIS — R350 Frequency of micturition: Secondary | ICD-10-CM | POA: Insufficient documentation

## 2012-02-18 DIAGNOSIS — K219 Gastro-esophageal reflux disease without esophagitis: Secondary | ICD-10-CM | POA: Insufficient documentation

## 2012-02-18 HISTORY — DX: Gastro-esophageal reflux disease without esophagitis: K21.9

## 2012-02-18 LAB — URINE MICROSCOPIC-ADD ON

## 2012-02-18 LAB — URINALYSIS, ROUTINE W REFLEX MICROSCOPIC
Bilirubin Urine: NEGATIVE
Nitrite: NEGATIVE
Specific Gravity, Urine: 1.022 (ref 1.005–1.030)
pH: 5 (ref 5.0–8.0)

## 2012-02-18 NOTE — ED Notes (Signed)
C/o abd pain, RLQ radiating to R lower back, states, "cannot urinate", "last void a few minutes ago, scant return", (denies: nvd, fever, bleeding, vaginal sx, dysuria or other urinary sx), "just retention & pain".

## 2012-02-18 NOTE — ED Notes (Signed)
Pt reports having a hard time urinating today.  Reports that she just remembered that she did have slight nausea last night.  Reports (R) flank and back pain that just started.  Pt ambulatory without difficulty.  Skin warm, dry and intact.  Neuro intact.

## 2012-02-19 ENCOUNTER — Emergency Department (HOSPITAL_COMMUNITY): Payer: BC Managed Care – PPO

## 2012-02-19 LAB — DIFFERENTIAL
Basophils Absolute: 0 10*3/uL (ref 0.0–0.1)
Basophils Relative: 0 % (ref 0–1)
Eosinophils Absolute: 0.3 10*3/uL (ref 0.0–0.7)
Eosinophils Relative: 3 % (ref 0–5)
Lymphocytes Relative: 23 % (ref 12–46)
Lymphs Abs: 2.2 10*3/uL (ref 0.7–4.0)
Monocytes Absolute: 0.7 10*3/uL (ref 0.1–1.0)
Monocytes Relative: 7 % (ref 3–12)
Neutro Abs: 6.4 10*3/uL (ref 1.7–7.7)
Neutrophils Relative %: 66 % (ref 43–77)

## 2012-02-19 LAB — CBC
HCT: 38.9 % (ref 36.0–46.0)
Hemoglobin: 12.6 g/dL (ref 12.0–15.0)
MCH: 29.9 pg (ref 26.0–34.0)
MCHC: 32.4 g/dL (ref 30.0–36.0)
MCV: 92.2 fL (ref 78.0–100.0)
Platelets: 375 10*3/uL (ref 150–400)
RBC: 4.22 MIL/uL (ref 3.87–5.11)
RDW: 13.8 % (ref 11.5–15.5)
WBC: 9.6 10*3/uL (ref 4.0–10.5)

## 2012-02-19 LAB — BASIC METABOLIC PANEL
BUN: 18 mg/dL (ref 6–23)
CO2: 27 mEq/L (ref 19–32)
Calcium: 10 mg/dL (ref 8.4–10.5)
Chloride: 104 mEq/L (ref 96–112)
Creatinine, Ser: 0.96 mg/dL (ref 0.50–1.10)
GFR calc Af Amer: 71 mL/min — ABNORMAL LOW (ref >90)
GFR calc non Af Amer: 62 mL/min — ABNORMAL LOW (ref >90)
Glucose, Bld: 103 mg/dL — ABNORMAL HIGH (ref 70–99)
Potassium: 3.8 mEq/L (ref 3.5–5.1)
Sodium: 141 mEq/L (ref 135–145)

## 2012-02-19 MED ORDER — HYDROCODONE-ACETAMINOPHEN 5-325 MG PO TABS: 1.0000 | ORAL_TABLET | ORAL | Status: AC | PRN

## 2012-02-19 NOTE — ED Provider Notes (Signed)
Medical screening examination/treatment/procedure(s) were performed by non-physician practitioner and as supervising physician I was immediately available for consultation/collaboration.  Threasa Beards, MD 02/19/12 [phone]

## 2012-02-19 NOTE — ED Notes (Signed)
Pt upset about the wait time.  Reassured.  No distress noted.

## 2012-02-19 NOTE — Discharge Instructions (Signed)
Please review the instructions below. As discussed, the CT of your abdomen and pelvis tonight was without acute findings. Your urine shows no infection however there was a small amount of blood noted. Your blood count and chemistry panel were completely normal. We have prescribed a short course of medication for pain. Arrange follow up with your primary care physician for sometime this week for recheck and to have your urine rechecked. And if symptoms persist you may want to consider follow up with your gynecologist earlier than usual. Return for worsening symptoms otherwise follow up as discussed.  Abdominal Pain Many things can cause belly (abdominal) pain. Most times, the belly pain is not dangerous. The amount of belly pain does not tell how serious the problem may be. Many cases of belly pain can be watched and treated at home. HOME CARE   Do not take medicines that help you go poop (laxatives) unless told to by your doctor.   Only take medicine as told by your doctor.   Eat or drink as told by your doctor. Your doctor will tell you if you should be on a special diet.  GET HELP RIGHT AWAY IF:   The pain does not go away.   You have a fever.   You keep throwing up (vomiting).   The pain changes and is only in the right or left part of the belly.   You have bloody or tarry looking poop.  MAKE SURE YOU:   Understand these instructions.   Will watch your condition.   Will get help right away if you are not doing well or get worse.  Document Released: 05/24/2008 Document Revised: 08/18/2011 Document Reviewed: 12/22/2009 [facility] Patient Information 2012 [place].  Pain of Unknown Etiology (Pain Without a Known Cause) You have come to your caregiver because of pain. Pain can occur in any part of the body. Often there is not a definite cause. If your laboratory (blood or urine) work was normal and x-rays or other studies were normal, your caregiver may treat you without knowing  the cause of the pain. An example of this is the headache. Most headaches are diagnosed by taking a history. This means your caregiver asks you questions about your headaches. Your caregiver determines a treatment based on your answers. Usually testing done for headaches is normal. Often testing is not done unless there is no response to medications. Regardless of where your pain is located today, you can be given medications to make you comfortable. If no physical cause of pain can be found, most cases of pain will gradually leave as suddenly as they came.  If you have a painful condition and no reason can be found for the pain, It is importantthat you follow up with your caregiver. If the pain becomes worse or does not go away, it may be necessary to repeat tests and look further for a possible cause.  Only take over-the-counter or prescription medicines for pain, discomfort, or fever as directed by your caregiver.   For the protection of your privacy, test results can not be given over the phone. Make sure you receive the results of your test. Ask as to how these results are to be obtained if you have not been informed. It is your responsibility to obtain your test results.   You may continue all activities unless the activities cause more pain. When the pain lessens, it is important to gradually resume normal activities. Resume activities by beginning slowly and gradually increasing  the intensity and duration of the activities or exercise. During periods of severe pain, bed-rest may be helpful. Lay or sit in any position that is comfortable.   Ice used for acute (sudden) conditions may be effective. Use a large plastic bag filled with ice and wrapped in a towel. This may provide pain relief.   See your caregiver for continued problems. They can help or refer you for exercises or physical therapy if necessary.  If you were given medications for your condition, do not drive, operate machinery or power  tools, or sign legal documents for 24 hours. Do not drink alcohol, take sleeping pills, or take other medications that may interfere with treatment. See your caregiver immediately if you have pain that is becoming worse and not relieved by medications. Document Released: 08/31/2001 Document Revised: 08/18/2011 Document Reviewed: 12/06/2005 [facility] Patient Information 2012 [place].  Urinary Frequency The number of times a normal person urinates depends upon how much liquid they take in and how much liquid they are losing. If the temperature is hot and there is high humidity then the person will sweat more and usually breathe a little more frequently. These factors decrease the amount of frequency of urination that would be considered normal. The amount you drink is easily determined, but the amount of fluid lost is sometimes more difficult to calculate.  Fluid is lost in two ways:  Sensible fluid loss is usually measured by the amount of urine that you get rid of. Losses of fluid can also occur with diarrhea.   Insensible fluid loss is more difficult to measure. It is caused by evaporation. Insensible loss of fluid occurs through breathing and sweating. It usually ranges from a little less than a quart to a little more than a quart of fluid a day.  In normal temperatures and activity levels the average person may urinate 4 to 7 times in a 24-hour period. Needing to urinate more often than that could indicate a problem. If one urinates 4 to 7 times in 24 hours and has large volumes each time, that could indicate a different problem from one who urinates 4 to 7 times a day and has small volumes. The time of urinating is also an important. Most urinating should be done during the waking hours. Getting up at night to urinate frequently can indicate some problems. CAUSES  The bladder is the organ in your lower abdomen that holds urine. Like a balloon, it swells some as it fills up. Your nerves  sense this and tell you it is time to head for the bathroom. There are a number of reasons that you might feel the need to urinate more often than usual. They include:  Urinary tract infection. This is usually associated with other signs such as burning when you urinate.   In men, problems with the prostate (a walnut-size gland that is located near the tube that carries urine out of your body). There are two reasons why the prostate can cause an increased frequency of urination:   An enlarged prostate that does not let the bladder empty well. If the bladder only half empties when you urinate then it only has half the capacity to fill before you have to urinate again.   The nerves in the bladder become more hypersensitive with an increased size of the prostate even if the bladder empties completely.   Pregnancy.   Obesity. Excess weight is more likely to cause a problem for women more than for men.  Bladder stones or other bladder problems.   Caffeine.   Alcohol.   Medications. For example, drugs that help the body get rid of extra fluid (diuretics) increase urine production. Some other medicines must be taken with lots of fluids.   Muscle or nerve weakness. This might be the result of a spinal cord injury, a stroke, multiple sclerosis or Parkinson's disease.   Long-standing diabetes can decrease the sensation of the bladder. This loss of sensation makes it harder to sense the bladder needs to be emptied. Over a period of years the bladder is stretched out by constant overfilling. This weakens the bladder muscles so that the bladder does not empty well and has less capacity to fill with new urine.   Interstitial cystitis (also called painful bladder syndrome). This condition develops because the tissues that line the insider of the bladder are inflamed (inflammation is the body's way of reacting to injury or infection). It causes pain and frequent urination. It occurs in women more often  than in men.  DIAGNOSIS   To decide what might be causing your urinary frequency, your healthcare provider will probably:   Ask about symptoms you have noticed.   Ask about your overall health. This will include questions about any medications you are taking.   Do a physical examination.   Order some tests. These might include:   A blood test to check for diabetes or other health issues that could be contributing to the problem.   Urine testing. This could measure the flow of urine and the pressure on the bladder.   A test of your neurological system (the brain, spinal cord and nerves). This is the system that senses the need to urinate.   A bladder test to check whether it is emptying completely when you urinate.   Cytoscopy. This test uses a thin tube with a tiny camera on it. It offers a look inside your urethra and bladder to see if there are problems.   Imaging tests. You might be given a contrast dye and then asked to urinate. X-rays are taken to see how your bladder is working.  TREATMENT  It is important for you to be evaluated to determine if the amount or frequency that you have is unusual or abnormal. If it is found to be abnormal the cause should be determined and this can usually be found out easily. Depending upon the cause treatment could include medication, stimulation of the nerves, or surgery. There are not too many things that you can do as an individual to change your urinary frequency. It is important that you balance the amount of fluid intake needed to compensate for your activity and the temperature. Medical problems will be diagnosed and taken care of by your physician. There is no particular bladder training such as Kegel's exercises that you can do to help urinary frequency. This is an exercise this is usually done for people who have leaking of urine when they laugh cough or sneeze. HOME CARE INSTRUCTIONS   Take any medications your healthcare provider  prescribed or suggested. Follow the directions carefully.   Practice any lifestyle changes that are recommended. These might include:   Drinking less fluid or drinking at different times of the day. If you need to urinate often during the night, for example, you may need to stop drinking fluids early in the evening.   Cutting down on caffeine or alcohol. They both can make you need to urinate more often than normal. Caffeine  is found in coffee, tea and sodas.   Losing weight, if that is recommended.   Keep a journal or a log. You might be asked to record how much you drink and when and when you feel the need to urinate. This will also help evaluate how well the treatment provided by your physician is working.  SEEK MEDICAL CARE IF:   Your need to urinate often gets worse.   You feel increased pain or irritation when you urinate.   You notice blood in your urine.   You have questions about any medications that your healthcare provider recommended.   You notice blood, pus or swelling at the site of any test or treatment procedure.   You develop a fever of more than 100.5 F (38.1 C).  SEEK IMMEDIATE MEDICAL CARE IF:  You develop a fever of more than 102.0 F (38.9 C). Document Released: 10/02/2009 Document Revised: 08/18/2011 Document Reviewed: 10/02/2009 [facility] Patient Information 2012 [place].

## 2012-02-19 NOTE — ED Provider Notes (Signed)
History     CSN: [id number]  Arrival date & time 02/18/12  2031   First MD Initiated Contact with Patient 02/19/12 0023      Chief Complaint  Patient presents with  . Urinary Retention  . Abdominal Pain  . Back Pain    HPI: Patient is a 66 y.o. female presenting with abdominal pain and back pain. The history is provided by the patient.  Abdominal Pain The primary symptoms of the illness include abdominal pain. The primary symptoms of the illness do not include fever, shortness of breath, nausea, vomiting, diarrhea, hematemesis, hematochezia or dysuria. The current episode started 3 to 5 hours ago. The onset of the illness was sudden. The problem has been rapidly improving.  Additional symptoms associated with the illness include back pain.  Back Pain  Associated symptoms include abdominal pain. Pertinent negatives include no fever and no dysuria.  Patient reports that approximately 9 clock tonight she began to have the suprapubic area pain that radiated to her flank and right lower back. She has noted frequency of urination intermittently throughout the day. And just prior to arrival she states she was having difficulty urinating though she states since arriving in the emergency department she has been able to void without difficulty and symptoms have improved. Patient has had similar symptoms in the past and has discussed with her primary care physician. Has had testing done without definitive diagnosis. Admits that she gets her annual GYN screening and has had no GYN-related issues. Denies recent strenuous activity or heavy lifting or injury of any sort.  Past Medical History  Diagnosis Date  . Acid reflux     Past Surgical History  Procedure Date  . Knee surgery     x2  . Rotator cuff repair     x2  . Trigger finger release   . Carpal tunnel release     Family History  Problem Relation Age of Onset  . Cancer Mother   . Cancer Father   . Diabetes Sister     History    Substance Use Topics  . Smoking status: Never Smoker   . Smokeless tobacco: Not on file  . Alcohol Use: No    OB History    Grav Para Term Preterm Abortions TAB SAB Ect Mult Living                  Review of Systems  Constitutional: Negative.  Negative for fever.  HENT: Negative.   Eyes: Negative.   Respiratory: Negative.  Negative for shortness of breath.   Cardiovascular: Negative.   Gastrointestinal: Positive for abdominal pain. Negative for nausea, vomiting, diarrhea, hematochezia and hematemesis.  Genitourinary: Negative.  Negative for dysuria.  Musculoskeletal: Positive for back pain.  Skin: Negative.   Neurological: Negative.   Hematological: Negative.   Psychiatric/Behavioral: Negative.     Allergies  Aspirin and Celecoxib  Home Medications   Current Outpatient Rx  Name Route Sig Dispense Refill  . BIOTIN 1000 MCG PO TABS Oral Take 1,000 mcg by mouth daily.    Marland Kitchen ESOMEPRAZOLE MAGNESIUM 40 MG PO CPDR Oral Take 40 mg by mouth daily before breakfast.    . CHOICE DM FIBER-BURST PO Oral Take 2 tablets by mouth daily.    Marland Kitchen RALOXIFENE HCL 60 MG PO TABS Oral Take 60 mg by mouth daily.      BP 139/83  Pulse 91  Temp 98.1 F (36.7 C)  Resp 20  SpO2 98%  Physical Exam  Constitutional: She is oriented to person, place, and time. She appears well-developed and well-nourished.  HENT:  Head: Normocephalic and atraumatic.  Eyes: Conjunctivae are normal.  Neck: Neck supple.  Cardiovascular: Normal rate and regular rhythm.   Pulmonary/Chest: Effort normal and breath sounds normal.  Abdominal: Soft. Bowel sounds are normal.         Mils SP TTP  Musculoskeletal: Normal range of motion.       Back:       Mild (R) low back TTP  Neurological: She is alert and oriented to person, place, and time.  Skin: Skin is warm and dry. No erythema.  Psychiatric: She has a normal mood and affect.    ED Course  Procedures   Patient reports symptoms have much improved.  Findings and clinical impression discussed with patient. Will plan for discharge home with short course of medication for pain and encourage patient to arrange follow up with her primary care physician to have her urine rechecked in approximately one week. Patient instructed to return for worsening symptoms. Patient agreeable with plan.  Labs Reviewed  URINALYSIS, ROUTINE W REFLEX MICROSCOPIC - Abnormal; Notable for the following:    Hgb urine dipstick LARGE (*)    Leukocytes, UA SMALL (*)    All other components within normal limits  BASIC METABOLIC PANEL - Abnormal; Notable for the following:    Glucose, Bld 103 (*)    GFR calc non Af Amer 62 (*)    GFR calc Af Amer 71 (*)    All other components within normal limits  URINE MICROSCOPIC-ADD ON  CBC  DIFFERENTIAL   Ct Abdomen Pelvis Wo Contrast  02/19/2012  *RADIOLOGY REPORT*  Clinical Data: Right flank pain; difficulty urinating.  Hematuria.  CT ABDOMEN AND PELVIS WITHOUT CONTRAST  Technique:  Multidetector CT imaging of the abdomen and pelvis was performed following the standard protocol without intravenous contrast.  Comparison: CT of the abdomen and pelvis performed 11/13/2010, and abdominal ultrasound performed 11/02/2010  Findings: Minimal left basilar atelectasis is noted.  Scattered small blebs are seen within both lungs.  The liver and spleen are unremarkable in appearance.  The gallbladder is within normal limits.  The pancreas and adrenal glands are unremarkable.  Scattered bilateral renal cysts are again seen, measuring up to 2.2 cm in size.  The kidneys are otherwise unremarkable in appearance. There is no evidence of hydronephrosis.  No renal or ureteral stones are seen.  No perinephric stranding is appreciated.  No free fluid is identified.  The small bowel is unremarkable in appearance.  The stomach is within normal limits.  No acute vascular abnormalities are seen.  Mild scattered calcification is noted along the abdominal aorta and  its branches.  The appendix is normal in caliber and contains air, without evidence for appendicitis.  The colon is partially filled with stool and residual contrast; it is unremarkable in appearance.  The bladder is decompressed and not well assessed.  The uterus is unremarkable in appearance.  The ovaries are relatively symmetric; no suspicious adnexal masses are seen.  No inguinal lymphadenopathy is seen; visualized inguinal nodes are borderline normal in size.  No acute osseous abnormalities are identified.  IMPRESSION:  1.  No evidence of hydronephrosis; no renal or ureteral stones seen. 2.  Scattered bilateral renal cysts seen. 3.  Scattered small blebs noted at both lung bases. 4.  Mild scattered calcification along the abdominal aorta and its branches.  Original Report Authenticated By: Santa Lighter, M.D.  No diagnosis found.    MDM  *HPI/PE and clinical findings c/w 1. Urinary frequency (source unknown, afebrile, U/A unremarkable except for small amount blood, Ct abd pelvis w/o acute findings, CBC and B-Met normal) 2. SP and (R) flank pain (see above) Has had before, PCP aware and has investigated w/o definitive findings.       Jeryl Columbia, NP 02/19/12 [phone]

## 2012-02-20 LAB — URINE CULTURE: Culture  Setup Time: 201303021122

## 2013-03-26 ENCOUNTER — Other Ambulatory Visit (HOSPITAL_COMMUNITY): Payer: Self-pay | Admitting: Internal Medicine

## 2013-03-26 DIAGNOSIS — R945 Abnormal results of liver function studies: Secondary | ICD-10-CM

## 2013-03-26 DIAGNOSIS — R109 Unspecified abdominal pain: Secondary | ICD-10-CM

## 2013-03-27 ENCOUNTER — Other Ambulatory Visit: Payer: Self-pay | Admitting: Internal Medicine

## 2013-03-27 DIAGNOSIS — R945 Abnormal results of liver function studies: Secondary | ICD-10-CM

## 2013-03-30 ENCOUNTER — Ambulatory Visit
Admission: RE | Admit: 2013-03-30 | Discharge: 2013-03-30 | Disposition: A | Discharge status: Home / Self Care | Payer: BC Managed Care – PPO | Source: Ambulatory Visit | Attending: Internal Medicine | Admitting: Internal Medicine

## 2013-03-30 ENCOUNTER — Other Ambulatory Visit (HOSPITAL_COMMUNITY): Payer: BC Managed Care – PPO

## 2013-03-30 DIAGNOSIS — R945 Abnormal results of liver function studies: Secondary | ICD-10-CM

## 2013-04-04 ENCOUNTER — Encounter (HOSPITAL_COMMUNITY): Payer: Self-pay | Admitting: Pharmacy Technician

## 2013-04-04 ENCOUNTER — Encounter: Payer: Self-pay | Admitting: Internal Medicine

## 2013-04-04 ENCOUNTER — Ambulatory Visit (INDEPENDENT_AMBULATORY_CARE_PROVIDER_SITE_OTHER): Payer: BC Managed Care – PPO | Admitting: Gastroenterology

## 2013-04-04 VITALS — BP 132/77 | HR 74 | Temp 97.5°F | Ht 59.0 in | Wt 239.0 lb

## 2013-04-04 DIAGNOSIS — R1011 Right upper quadrant pain: Secondary | ICD-10-CM

## 2013-04-04 DIAGNOSIS — Z860100 Personal history of colon polyps, unspecified: Secondary | ICD-10-CM

## 2013-04-04 DIAGNOSIS — R110 Nausea: Secondary | ICD-10-CM

## 2013-04-04 NOTE — Progress Notes (Signed)
Primary Care Physician:  Purvis Kilts, MD  Primary Gastroenterologist:  Garfield Cornea, MD   Chief Complaint  Patient presents with  . Abdominal Pain  . Nausea    HPI:  Cheryl [name] is a 67 y.o. female here at the request of Dr. Hilma Favors for further evaluation of abdominal pain and nausea. She was last seen in November 2011 for further evaluation right upper quadrant pain, AST of 49 at that time. CT in 2011 showed hepatic steatosis and Riedel's lobe configuration liver. HIDA scan showed a gallbladder function of 29% with CCK just below the normal of 30%. She had a noncontrast CT of the abdomen and pelvis in March of 2013 which showed bilateral renal cyst, mild scattered calcification along the abdominal aorta and its branches. Lliver, pancreas, gallbladder are within normal limits.  RUQ pain subsided after last OV in 2011. Started back 2 weeks ago. Woke up in the middle of night with RUQ pain and nausea. No vomiting. One loose yellow stool. No fever. Constant pain. Not worse with meals. Appetite intermittent. Lost 8 pounds in two weeks. No heartburn on Nexium. No dysphagia. BM increased up to 3 times per day, solid stool. No melena, brbpr. Ibuprofen only rarely. Aleve for arthritis occasionally, several times per week.  Current Outpatient Prescriptions  Medication Sig Dispense Refill  . esomeprazole (NEXIUM) 40 MG capsule Take 40 mg by mouth daily before breakfast.      . naproxen sodium (ANAPROX) 220 MG tablet Take 220 mg by mouth as needed.      . Nutritional Supplements (CHOICE DM FIBER-BURST PO) Take 2 tablets by mouth daily.      . raloxifene (EVISTA) 60 MG tablet Take 60 mg by mouth daily.      . traMADol (ULTRAM) 50 MG tablet Take 50 mg by mouth every 6 (six) hours as needed for pain.      . Biotin 1000 MCG tablet Take 1,000 mcg by mouth daily.       No current facility-administered medications for this visit.    Allergies as of 04/04/2013 - Review Complete 04/04/2013   Allergen Reaction Noted  . Aspirin Nausea And Vomiting   . Celecoxib Rash     Past Medical History  Diagnosis Date  . Acid reflux     Past Surgical History  Procedure Laterality Date  . Knee surgery      x2  . Rotator cuff repair      x2  . Trigger finger release    . Carpal tunnel release    . Colonoscopy  02/15/2005    [id number] small polyps ablated via cold biopsy, one from transverse colon and two from the rectum/Small external hemorrhoids  . Colonoscopy  07/10/2010    [id number] rectum/long redundant colon, polyps in the sigmoid, descending, hepatic flexure/ADENOMATOUS POLYPS. next TCS due 06/2015  . Esophagogastroduodenoscopy  1995    Gastritis  . Colonoscopy  1995    3 polyps, path revealed chronic colitis    Family History  Problem Relation Age of Onset  . Cancer Mother   . Cancer Father   . Diabetes Sister     History   Social History  . Marital Status: Married    Spouse Name: N/A    Number of Children: 2  . Years of Education: N/A   Occupational History  . BUS DRIVER    Social History Main Topics  . Smoking status: Never Smoker   . Smokeless tobacco: Not on file  . Alcohol  Use: No  . Drug Use: No  . Sexually Active:    Other Topics Concern  . Not on file   Social History Narrative  . No narrative on file      ROS:  General: Negative for anorexia, weight loss, fever, chills, fatigue, weakness. Eyes: Negative for vision changes.  ENT: Negative for hoarseness, difficulty swallowing , nasal congestion. CV: Negative for chest pain, angina, palpitations, dyspnea on exertion, peripheral edema.  Respiratory: Negative for dyspnea at rest, dyspnea on exertion, cough, sputum, wheezing.  GI: See history of present illness. GU:  Negative for dysuria, hematuria, urinary incontinence, urinary frequency, nocturnal urination.  MS: Complains of joint pain. Negative for low back pain.  Derm: Negative for rash or itching.  Neuro: Negative for weakness,  abnormal sensation, seizure, frequent headaches, memory loss, confusion.  Psych: Negative for anxiety, depression, suicidal ideation, hallucinations.  Endo: Negative for unusual weight change.  Heme: Negative for bruising or bleeding. Allergy: Negative for rash or hives.    Physical Examination:  BP 132/77  Pulse 74  Temp(Src) 97.5 F (36.4 C) (Oral)  Ht 4\' 11"  (1.499 m)  Wt 239 lb (108.41 kg)  BMI 48.25 kg/m2   General: Well-nourished, well-developed in no acute distress. Obese. Head: Normocephalic, atraumatic.   Eyes: Conjunctiva pink, no icterus. Mouth: Oropharyngeal mucosa moist and pink , no lesions erythema or exudate. Neck: Supple without thyromegaly, masses, or lymphadenopathy.  Lungs: Clear to auscultation bilaterally.  Heart: Regular rate and rhythm, no murmurs rubs or gallops.  Abdomen: Bowel sounds are normal, nondistended, no hepatosplenomegaly or masses, no abdominal bruits or    hernia , no rebound or guarding.  Tender in the right upper quadrant but also over the right rib cage margin. Rectal: Not performed Extremities: No lower extremity edema. No clubbing or deformities.  Neuro: Alert and oriented x 4 , grossly normal neurologically.  Skin: Warm and dry, no rash or jaundice.   Psych: Alert and cooperative, normal mood and affect.    Imaging Studies: US Abdomen Complete  03/30/2013  *RADIOLOGY REPORT*  Clinical Data:  No abnormal liver function studies,  COMPLETE ABDOMINAL ULTRASOUND  Comparison:  CT 02/19/2012  Findings:  Gallbladder:  No gallbladder wall thickening.  No pericholecystic fluid.  No echogenic gallstones.  The patient does have right upper quadrant tenderness but no definitive sonographic Murphy's sign.  Common bile duct:  Normal in diameter at 5 mm.  Liver:  Heterogeneous echotexture.  No ductal dilatation.  IVC:  Appears normal.  Pancreas:  No focal abnormality seen.  Spleen:  Normal in size and echogenicity.  Of  Right Kidney:  Normal size at 10.2  cm.  No hydronephrosis.  Left Kidney:  Normal in size and not benign centers.  No hydronephrosis.  There are small cysts is similar to comparison CT.  Abdominal aorta:  No aneurysm identified.  IMPRESSION:  1.  Patient has right upper quadrant tenderness no sonographic evidence of acute cholecystitis. 2.  Bilateral simple appearing renal cysts.  3.  Echogenic liver commonly represents hepatic steatosis.  Cannot exclude hepatocellular disease.   Original Report Authenticated By: Suzy Bouchard, M.D.

## 2013-04-04 NOTE — Patient Instructions (Addendum)
We have scheduled you for an upper endoscopy with Dr. Gala Romney. Please see separate instructions.  Please avoid Aleve and Ibuprofen until after your procedure.

## 2013-04-05 ENCOUNTER — Encounter: Payer: Self-pay | Admitting: Gastroenterology

## 2013-04-05 NOTE — Progress Notes (Signed)
Cc PCP 

## 2013-04-05 NOTE — Assessment & Plan Note (Signed)
Due for next colonoscopy in July 2016.

## 2013-04-05 NOTE — Assessment & Plan Note (Addendum)
67 year old lady with recurrent right upper quadrant abdominal pain with previous extensive workup back in 2011. Symptoms settled down up until recently. She has associated nausea without vomiting. Increased number of stools daily but no diarrhea. Complains of anorexia and 8 pound weight loss.  Abdominal ultrasound unremarkable. Gallbladder looks normal. She takes NSAIDs for arthritis. On exam, she has evidence of musculoskeletal pain with palpation of the rib cage margin. However, clinically based on her symptoms would also recommend upper endoscopy for further evaluation/rule out peptic ulcer disease.  I have discussed the risks, alternatives, benefits with regards to but not limited to the risk of reaction to medication, bleeding, infection, perforation and the patient is agreeable to proceed. Written consent to be obtained.

## 2013-04-16 ENCOUNTER — Encounter (HOSPITAL_COMMUNITY)
Admission: RE | Disposition: A | Discharge status: Home / Self Care | Payer: Self-pay | Source: Ambulatory Visit | Attending: Internal Medicine

## 2013-04-16 ENCOUNTER — Encounter (HOSPITAL_COMMUNITY): Payer: Self-pay | Admitting: *Deleted

## 2013-04-16 ENCOUNTER — Ambulatory Visit (HOSPITAL_COMMUNITY)
Admission: RE | Admit: 2013-04-16 | Discharge: 2013-04-16 | Disposition: A | Discharge status: Home / Self Care | Payer: BC Managed Care – PPO | Source: Ambulatory Visit | Attending: Internal Medicine | Admitting: Internal Medicine

## 2013-04-16 DIAGNOSIS — K219 Gastro-esophageal reflux disease without esophagitis: Secondary | ICD-10-CM | POA: Insufficient documentation

## 2013-04-16 DIAGNOSIS — M129 Arthropathy, unspecified: Secondary | ICD-10-CM | POA: Insufficient documentation

## 2013-04-16 DIAGNOSIS — R1011 Right upper quadrant pain: Secondary | ICD-10-CM

## 2013-04-16 DIAGNOSIS — Z888 Allergy status to other drugs, medicaments and biological substances status: Secondary | ICD-10-CM | POA: Insufficient documentation

## 2013-04-16 DIAGNOSIS — R110 Nausea: Secondary | ICD-10-CM

## 2013-04-16 DIAGNOSIS — K7689 Other specified diseases of liver: Secondary | ICD-10-CM | POA: Insufficient documentation

## 2013-04-16 DIAGNOSIS — K449 Diaphragmatic hernia without obstruction or gangrene: Secondary | ICD-10-CM

## 2013-04-16 DIAGNOSIS — Z886 Allergy status to analgesic agent status: Secondary | ICD-10-CM | POA: Insufficient documentation

## 2013-04-16 DIAGNOSIS — Z79899 Other long term (current) drug therapy: Secondary | ICD-10-CM | POA: Insufficient documentation

## 2013-04-16 DIAGNOSIS — E669 Obesity, unspecified: Secondary | ICD-10-CM | POA: Insufficient documentation

## 2013-04-16 DIAGNOSIS — Z6841 Body Mass Index (BMI) 40.0 and over, adult: Secondary | ICD-10-CM | POA: Insufficient documentation

## 2013-04-16 HISTORY — PX: ESOPHAGOGASTRODUODENOSCOPY: SHX5428

## 2013-04-16 SURGERY — EGD (ESOPHAGOGASTRODUODENOSCOPY)
Anesthesia: Moderate Sedation

## 2013-04-16 MED ORDER — SODIUM CHLORIDE 0.9 % IV SOLN
INTRAVENOUS | Status: DC
  Administered 2013-04-16: 15:00:00 via INTRAVENOUS

## 2013-04-16 MED ORDER — MIDAZOLAM HCL 5 MG/5ML IJ SOLN
INTRAMUSCULAR | Status: AC
  Filled 2013-04-16: qty 10

## 2013-04-16 MED ORDER — ONDANSETRON HCL 4 MG/2ML IJ SOLN
INTRAMUSCULAR | Status: DC | PRN
  Administered 2013-04-16: 4 mg via INTRAVENOUS

## 2013-04-16 MED ORDER — MEPERIDINE HCL 100 MG/ML IJ SOLN
INTRAMUSCULAR | Status: DC | PRN
  Administered 2013-04-16: 50 mg via INTRAVENOUS
  Administered 2013-04-16: 25 mg via INTRAVENOUS

## 2013-04-16 MED ORDER — BUTAMBEN-TETRACAINE-BENZOCAINE 2-2-14 % EX AERO
INHALATION_SPRAY | CUTANEOUS | Status: DC | PRN
  Administered 2013-04-16: 2 via TOPICAL

## 2013-04-16 MED ORDER — ONDANSETRON HCL 4 MG/2ML IJ SOLN
INTRAMUSCULAR | Status: AC
  Filled 2013-04-16: qty 2

## 2013-04-16 MED ORDER — STERILE WATER FOR IRRIGATION IR SOLN
Status: DC | PRN
  Administered 2013-04-16: 15:00:00

## 2013-04-16 MED ORDER — MEPERIDINE HCL 100 MG/ML IJ SOLN
INTRAMUSCULAR | Status: AC
  Filled 2013-04-16: qty 1

## 2013-04-16 MED ORDER — MIDAZOLAM HCL 5 MG/5ML IJ SOLN
INTRAMUSCULAR | Status: DC | PRN
  Administered 2013-04-16: 1 mg via INTRAVENOUS
  Administered 2013-04-16: 2 mg via INTRAVENOUS

## 2013-04-16 NOTE — Op Note (Signed)
[name], [id number]   ENDOSCOPY PROCEDURE REPORT  PATIENT: Cheryl [name], Cheryl [name].  MR#: [id number] BIRTHDATE: 1946/03/22 , 66  yrs. old GENDER: Female ENDOSCOPIST: R.  Garfield Cornea, MD FACP FACG REFERRED BY:  Sharilyn Sites, M.D. PROCEDURE DATE:  04/16/2013 PROCEDURE:     diagnostic EGD  INDICATIONS:     right upper quadrant abdominal pain  INFORMED CONSENT:   The risks, benefits, limitations, alternatives and imponderables have been discussed.  The potential for biopsy, esophogeal dilation, etc. have also been reviewed.  Questions have been answered.  All parties agreeable.  Please see the history and physical in the medical record for more information.  MEDICATIONS:  Versed 3 mg IV and Demerol 75 mg IV in divided doses.  DESCRIPTION OF PROCEDURE:   The [id number]  endoscope was introduced through the mouth and advanced to the second portion of the duodenum without difficulty or limitations.  The mucosal surfaces were surveyed very carefully during advancement of the scope and upon withdrawal.  Retroflexion view of the proximal stomach and esophagogastric junction was performed.      FINDINGS: Normal-appearing esophagus. Stomach empty. Small hiatal hernia. Normal gastric mucosa. Patent pylorus. Normal first and second portion of the duodenum  THERAPEUTIC / DIAGNOSTIC MANEUVERS PERFORMED:  None   COMPLICATIONS:  None  IMPRESSION:   Hiatal hernia. No endoscopic explanation for patient's abdominal pain. At least  a portion of her pain may be musculoskeletal in origin. Likewise, fatty liver can produce some degree of pain in the right upper quadrant. Nexium may cause abdominal pain in about 5% of patients on this medication.  RECOMMENDATIONS:  Stop Nexium for now. Trial of Protonix 40 mg daily. Follow previous recommendations regarding management of fatty liver including increasing aerobic exercise and weight  loss.    _______________________________ R. Garfield Cornea, MD FACP [facility] eSigned:  R. Garfield Cornea, MD FACP [facility] 04/16/2013 3:59 PM     CC:  PATIENT NAME:  [name] MR#: [id number]

## 2013-04-16 NOTE — Progress Notes (Signed)
Received outside labs done on 03/23/2013. Sodium 142, potassium 4.3, BUN 17, creatinine 0.8, total bilirubin 0.2, alkaline phosphatase 76, AST 57, ALT 44, albumin 4.1, white blood cell count 7300, hemoglobin 12, platelets 402,000, amylase 56, lipase 28, H. pylori IgG less than 0.4 which is negative.  Previously Hep B and C serologies were negative. Recent abd u/s showed hepatic steatosis. BMI 48.25.   Please let patient know she likely has abnormal LFTs related to fatty liver.  Instructions for fatty liver: Recommend 1-2# weight loss per week until ideal body weight through exercise & diet. Low fat/cholesterol diet.   Avoid sweets, sodas, fruit juices, sweetened beverages like tea, etc. Gradually increase exercise from 15 min daily up to 1 hr per day 5 days/week. Limit alcohol use.  Recheck LFTs in 3 months.

## 2013-04-16 NOTE — Interval H&P Note (Signed)
History and Physical Interval Note:  04/16/2013 3:22 PM  Cheryl [name]  has presented today for surgery, with the diagnosis of RUQ Pain and Nausea  The various methods of treatment have been discussed with the patient and family. After consideration of risks, benefits and other options for treatment, the patient has consented to  Procedure(s) with comments: ESOPHAGOGASTRODUODENOSCOPY (EGD) (N/A) - 3:30 as a surgical intervention .  The patient's history has been reviewed, patient examined, no change in status, stable for surgery.  I have reviewed the patient's chart and labs.  Questions were answered to the patient's satisfaction.     EGD per plan.The risks, benefits, limitations, alternatives and imponderables have been reviewed with the patient. Potential for esophageal dilation, biopsy, etc. have also been reviewed.  Questions have been answered. All parties agreeable.   Manus Rudd

## 2013-04-16 NOTE — H&P (View-Only) (Signed)
Primary Care Physician:  Purvis Kilts, MD  Primary Gastroenterologist:  Garfield Cornea, MD   Chief Complaint  Patient presents with  . Abdominal Pain  . Nausea    HPI:  Cheryl [name] is a 67 y.o. female here at the request of Dr. Hilma Favors for further evaluation of abdominal pain and nausea. She was last seen in November 2011 for further evaluation right upper quadrant pain, AST of 49 at that time. CT in 2011 showed hepatic steatosis and Riedel's lobe configuration liver. HIDA scan showed a gallbladder function of 29% with CCK just below the normal of 30%. She had a noncontrast CT of the abdomen and pelvis in March of 2013 which showed bilateral renal cyst, mild scattered calcification along the abdominal aorta and its branches. Lliver, pancreas, gallbladder are within normal limits.  RUQ pain subsided after last OV in 2011. Started back 2 weeks ago. Woke up in the middle of night with RUQ pain and nausea. No vomiting. One loose yellow stool. No fever. Constant pain. Not worse with meals. Appetite intermittent. Lost 8 pounds in two weeks. No heartburn on Nexium. No dysphagia. BM increased up to 3 times per day, solid stool. No melena, brbpr. Ibuprofen only rarely. Aleve for arthritis occasionally, several times per week.  Current Outpatient Prescriptions  Medication Sig Dispense Refill  . esomeprazole (NEXIUM) 40 MG capsule Take 40 mg by mouth daily before breakfast.      . naproxen sodium (ANAPROX) 220 MG tablet Take 220 mg by mouth as needed.      . Nutritional Supplements (CHOICE DM FIBER-BURST PO) Take 2 tablets by mouth daily.      . raloxifene (EVISTA) 60 MG tablet Take 60 mg by mouth daily.      . traMADol (ULTRAM) 50 MG tablet Take 50 mg by mouth every 6 (six) hours as needed for pain.      . Biotin 1000 MCG tablet Take 1,000 mcg by mouth daily.       No current facility-administered medications for this visit.    Allergies as of 04/04/2013 - Review Complete 04/04/2013   Allergen Reaction Noted  . Aspirin Nausea And Vomiting   . Celecoxib Rash     Past Medical History  Diagnosis Date  . Acid reflux     Past Surgical History  Procedure Laterality Date  . Knee surgery      x2  . Rotator cuff repair      x2  . Trigger finger release    . Carpal tunnel release    . Colonoscopy  02/15/2005    [id number] small polyps ablated via cold biopsy, one from transverse colon and two from the rectum/Small external hemorrhoids  . Colonoscopy  07/10/2010    [id number] rectum/long redundant colon, polyps in the sigmoid, descending, hepatic flexure/ADENOMATOUS POLYPS. next TCS due 06/2015  . Esophagogastroduodenoscopy  1995    Gastritis  . Colonoscopy  1995    3 polyps, path revealed chronic colitis    Family History  Problem Relation Age of Onset  . Cancer Mother   . Cancer Father   . Diabetes Sister     History   Social History  . Marital Status: Married    Spouse Name: N/A    Number of Children: 2  . Years of Education: N/A   Occupational History  . BUS DRIVER    Social History Main Topics  . Smoking status: Never Smoker   . Smokeless tobacco: Not on file  . Alcohol  Use: No  . Drug Use: No  . Sexually Active:    Other Topics Concern  . Not on file   Social History Narrative  . No narrative on file      ROS:  General: Negative for anorexia, weight loss, fever, chills, fatigue, weakness. Eyes: Negative for vision changes.  ENT: Negative for hoarseness, difficulty swallowing , nasal congestion. CV: Negative for chest pain, angina, palpitations, dyspnea on exertion, peripheral edema.  Respiratory: Negative for dyspnea at rest, dyspnea on exertion, cough, sputum, wheezing.  GI: See history of present illness. GU:  Negative for dysuria, hematuria, urinary incontinence, urinary frequency, nocturnal urination.  MS: Complains of joint pain. Negative for low back pain.  Derm: Negative for rash or itching.  Neuro: Negative for weakness,  abnormal sensation, seizure, frequent headaches, memory loss, confusion.  Psych: Negative for anxiety, depression, suicidal ideation, hallucinations.  Endo: Negative for unusual weight change.  Heme: Negative for bruising or bleeding. Allergy: Negative for rash or hives.    Physical Examination:  BP 132/77  Pulse 74  Temp(Src) 97.5 F (36.4 C) (Oral)  Ht 4\' 11"  (1.499 m)  Wt 239 lb (108.41 kg)  BMI 48.25 kg/m2   General: Well-nourished, well-developed in no acute distress. Obese. Head: Normocephalic, atraumatic.   Eyes: Conjunctiva pink, no icterus. Mouth: Oropharyngeal mucosa moist and pink , no lesions erythema or exudate. Neck: Supple without thyromegaly, masses, or lymphadenopathy.  Lungs: Clear to auscultation bilaterally.  Heart: Regular rate and rhythm, no murmurs rubs or gallops.  Abdomen: Bowel sounds are normal, nondistended, no hepatosplenomegaly or masses, no abdominal bruits or    hernia , no rebound or guarding.  Tender in the right upper quadrant but also over the right rib cage margin. Rectal: Not performed Extremities: No lower extremity edema. No clubbing or deformities.  Neuro: Alert and oriented x 4 , grossly normal neurologically.  Skin: Warm and dry, no rash or jaundice.   Psych: Alert and cooperative, normal mood and affect.    Imaging Studies: US Abdomen Complete  03/30/2013  *RADIOLOGY REPORT*  Clinical Data:  No abnormal liver function studies,  COMPLETE ABDOMINAL ULTRASOUND  Comparison:  CT 02/19/2012  Findings:  Gallbladder:  No gallbladder wall thickening.  No pericholecystic fluid.  No echogenic gallstones.  The patient does have right upper quadrant tenderness but no definitive sonographic Murphy's sign.  Common bile duct:  Normal in diameter at 5 mm.  Liver:  Heterogeneous echotexture.  No ductal dilatation.  IVC:  Appears normal.  Pancreas:  No focal abnormality seen.  Spleen:  Normal in size and echogenicity.  Of  Right Kidney:  Normal size at 10.2  cm.  No hydronephrosis.  Left Kidney:  Normal in size and not benign centers.  No hydronephrosis.  There are small cysts is similar to comparison CT.  Abdominal aorta:  No aneurysm identified.  IMPRESSION:  1.  Patient has right upper quadrant tenderness no sonographic evidence of acute cholecystitis. 2.  Bilateral simple appearing renal cysts.  3.  Echogenic liver commonly represents hepatic steatosis.  Cannot exclude hepatocellular disease.   Original Report Authenticated By: Suzy Bouchard, M.D.

## 2013-04-17 LAB — COMPREHENSIVE METABOLIC PANEL
ALT: 44 U/L — AB (ref 7–35)
AST: 57 U/L
Alkaline Phosphatase: 76 U/L
BUN: 17 mg/dL (ref 4–21)
Creat: 0.98

## 2013-04-17 LAB — CBC
Amylase: 56 units/L (ref 25–110)
Lipase: 28 units/L (ref 0–53)
MCV: 91.1 fL
platelet count: 402

## 2013-04-18 ENCOUNTER — Other Ambulatory Visit: Payer: Self-pay | Admitting: Internal Medicine

## 2013-04-18 DIAGNOSIS — K7689 Other specified diseases of liver: Secondary | ICD-10-CM

## 2013-04-18 NOTE — Progress Notes (Signed)
Pt is aware. Lab order on file.

## 2013-04-19 ENCOUNTER — Encounter (HOSPITAL_COMMUNITY): Payer: Self-pay | Admitting: Internal Medicine

## 2013-06-12 ENCOUNTER — Other Ambulatory Visit: Payer: Self-pay

## 2013-06-12 DIAGNOSIS — K7689 Other specified diseases of liver: Secondary | ICD-10-CM

## 2013-06-21 LAB — HEPATIC FUNCTION PANEL
Albumin: 4 g/dL (ref 3.5–5.2)
Bilirubin, Direct: <0.1 mg/dL (ref 0.0–0.3)
Total Bilirubin: 0.3 mg/dL (ref 0.3–1.2)

## 2013-06-28 NOTE — Progress Notes (Signed)
Quick Note:  LFTs are better! Continue instructions for NASH as below. Instructions for fatty liver: Recommend 1-2# weight loss per week until ideal body weight through exercise & diet. Low fat/cholesterol diet.  Avoid sweets, sodas, fruit juices, sweetened beverages like tea, etc. Gradually increase exercise from 15 min daily up to 1 hr per day 5 days/week. Limit alcohol use.  OV in one year.  ______

## 2013-07-04 NOTE — Progress Notes (Signed)
REVIEWED.  

## 2013-07-04 NOTE — Progress Notes (Signed)
Quick Note:  1 YEAR OV REMINDER MADE ______

## 2013-08-15 ENCOUNTER — Other Ambulatory Visit: Payer: Self-pay | Admitting: Internal Medicine

## 2013-09-21 DIAGNOSIS — Z23 Encounter for immunization: Secondary | ICD-10-CM | POA: Diagnosis not present

## 2013-10-03 DIAGNOSIS — Z6841 Body Mass Index (BMI) 40.0 and over, adult: Secondary | ICD-10-CM | POA: Diagnosis not present

## 2013-10-03 DIAGNOSIS — M25519 Pain in unspecified shoulder: Secondary | ICD-10-CM | POA: Diagnosis not present

## 2013-10-03 DIAGNOSIS — Z23 Encounter for immunization: Secondary | ICD-10-CM | POA: Diagnosis not present

## 2013-10-13 ENCOUNTER — Emergency Department (HOSPITAL_COMMUNITY): Payer: BC Managed Care – PPO

## 2013-10-13 ENCOUNTER — Emergency Department (HOSPITAL_COMMUNITY)
Admission: EM | Admit: 2013-10-13 | Discharge: 2013-10-13 | Disposition: A | Discharge status: Home / Self Care | Payer: BC Managed Care – PPO | Attending: Emergency Medicine | Admitting: Emergency Medicine

## 2013-10-13 DIAGNOSIS — S8000XA Contusion of unspecified knee, initial encounter: Secondary | ICD-10-CM | POA: Insufficient documentation

## 2013-10-13 DIAGNOSIS — R0789 Other chest pain: Secondary | ICD-10-CM

## 2013-10-13 DIAGNOSIS — R079 Chest pain, unspecified: Secondary | ICD-10-CM | POA: Diagnosis not present

## 2013-10-13 DIAGNOSIS — R071 Chest pain on breathing: Secondary | ICD-10-CM | POA: Insufficient documentation

## 2013-10-13 DIAGNOSIS — S99929A Unspecified injury of unspecified foot, initial encounter: Secondary | ICD-10-CM | POA: Diagnosis not present

## 2013-10-13 DIAGNOSIS — S8002XA Contusion of left knee, initial encounter: Secondary | ICD-10-CM

## 2013-10-13 DIAGNOSIS — Y92410 Unspecified street and highway as the place of occurrence of the external cause: Secondary | ICD-10-CM | POA: Insufficient documentation

## 2013-10-13 DIAGNOSIS — Y9389 Activity, other specified: Secondary | ICD-10-CM | POA: Insufficient documentation

## 2013-10-13 DIAGNOSIS — S8990XA Unspecified injury of unspecified lower leg, initial encounter: Secondary | ICD-10-CM | POA: Diagnosis not present

## 2013-10-13 DIAGNOSIS — Z87891 Personal history of nicotine dependence: Secondary | ICD-10-CM | POA: Insufficient documentation

## 2013-10-13 DIAGNOSIS — K219 Gastro-esophageal reflux disease without esophagitis: Secondary | ICD-10-CM | POA: Insufficient documentation

## 2013-10-13 DIAGNOSIS — M25569 Pain in unspecified knee: Secondary | ICD-10-CM | POA: Diagnosis not present

## 2013-10-13 DIAGNOSIS — Z79899 Other long term (current) drug therapy: Secondary | ICD-10-CM | POA: Insufficient documentation

## 2013-10-13 DIAGNOSIS — S298XXA Other specified injuries of thorax, initial encounter: Secondary | ICD-10-CM | POA: Diagnosis not present

## 2013-10-13 MED ORDER — HYDROCODONE-ACETAMINOPHEN 5-325 MG PO TABS
1.0000 | ORAL_TABLET | Freq: Once | ORAL | Status: AC
  Administered 2013-10-13: 1 via ORAL
  Filled 2013-10-13: qty 1

## 2013-10-13 MED ORDER — HYDROCODONE-ACETAMINOPHEN 5-325 MG PO TABS: 1.0000 | ORAL_TABLET | ORAL | Status: DC | PRN

## 2013-10-13 NOTE — ED Provider Notes (Signed)
Medical screening examination/treatment/procedure(s) were performed by non-physician practitioner and as supervising physician I was immediately available for consultation/collaboration.      Blanchie Dessert, MD 10/13/13 2209
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
## 2013-10-13 NOTE — ED Notes (Signed)
Patient transported to X-ray 
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
## 2013-10-29 DIAGNOSIS — Z1231 Encounter for screening mammogram for malignant neoplasm of breast: Secondary | ICD-10-CM | POA: Diagnosis not present

## 2013-11-19 DIAGNOSIS — Z124 Encounter for screening for malignant neoplasm of cervix: Secondary | ICD-10-CM | POA: Diagnosis not present

## 2013-11-19 DIAGNOSIS — Z01419 Encounter for gynecological examination (general) (routine) without abnormal findings: Secondary | ICD-10-CM | POA: Diagnosis not present

## 2014-01-23 DIAGNOSIS — L940 Localized scleroderma [morphea]: Secondary | ICD-10-CM | POA: Diagnosis not present

## 2014-03-06 ENCOUNTER — Other Ambulatory Visit: Payer: Self-pay | Admitting: Internal Medicine

## 2014-03-06 ENCOUNTER — Other Ambulatory Visit: Payer: Self-pay | Admitting: Gastroenterology

## 2014-03-20 DIAGNOSIS — M545 Low back pain, unspecified: Secondary | ICD-10-CM | POA: Diagnosis not present

## 2014-03-20 DIAGNOSIS — M47817 Spondylosis without myelopathy or radiculopathy, lumbosacral region: Secondary | ICD-10-CM | POA: Diagnosis not present

## 2014-03-20 DIAGNOSIS — G894 Chronic pain syndrome: Secondary | ICD-10-CM | POA: Diagnosis not present

## 2014-04-04 DIAGNOSIS — M545 Low back pain, unspecified: Secondary | ICD-10-CM | POA: Diagnosis not present

## 2014-04-04 DIAGNOSIS — M47817 Spondylosis without myelopathy or radiculopathy, lumbosacral region: Secondary | ICD-10-CM | POA: Diagnosis not present

## 2014-04-04 DIAGNOSIS — G894 Chronic pain syndrome: Secondary | ICD-10-CM | POA: Diagnosis not present

## 2014-05-06 DIAGNOSIS — Z6841 Body Mass Index (BMI) 40.0 and over, adult: Secondary | ICD-10-CM | POA: Diagnosis not present

## 2014-05-06 DIAGNOSIS — R252 Cramp and spasm: Secondary | ICD-10-CM | POA: Diagnosis not present

## 2014-05-06 DIAGNOSIS — L659 Nonscarring hair loss, unspecified: Secondary | ICD-10-CM | POA: Diagnosis not present

## 2014-05-06 DIAGNOSIS — E782 Mixed hyperlipidemia: Secondary | ICD-10-CM | POA: Diagnosis not present

## 2014-05-06 DIAGNOSIS — E559 Vitamin D deficiency, unspecified: Secondary | ICD-10-CM | POA: Diagnosis not present

## 2014-06-26 ENCOUNTER — Encounter: Payer: Self-pay | Admitting: Internal Medicine

## 2014-07-01 ENCOUNTER — Telehealth: Payer: Self-pay | Admitting: *Deleted

## 2014-07-01 NOTE — Telephone Encounter (Signed)
Pt called not understanding why she got a follow up letter in the mail, pt said she is not due for for a colonoscopy I made pt aware that she will need one in 2016 and per leslie lewis this was a one year follow up, pt wanted me to ask Dr. Roseanne Kaufman nurse about it, pt said she is not having any problems and she is not sure why she needs to come in. Please advise

## 2014-07-03 NOTE — Telephone Encounter (Signed)
Its just a follow up visit to make sure she is ok and to refill any prescriptions that we have her on. If pt does not want to come and doesn't need any refills then that is her choice. Please let her know.

## 2014-07-04 NOTE — Telephone Encounter (Signed)
I called patient and made her aware and she does not have any Rx and she doing fine, and she will call if she needs [place]

## 2014-07-26 DIAGNOSIS — Z6841 Body Mass Index (BMI) 40.0 and over, adult: Secondary | ICD-10-CM | POA: Diagnosis not present

## 2014-07-26 DIAGNOSIS — J309 Allergic rhinitis, unspecified: Secondary | ICD-10-CM | POA: Diagnosis not present

## 2014-07-26 DIAGNOSIS — R05 Cough: Secondary | ICD-10-CM | POA: Diagnosis not present

## 2014-07-26 DIAGNOSIS — R059 Cough, unspecified: Secondary | ICD-10-CM | POA: Diagnosis not present

## 2014-09-20 DIAGNOSIS — Z23 Encounter for immunization: Secondary | ICD-10-CM | POA: Diagnosis not present

## 2014-09-20 DIAGNOSIS — B369 Superficial mycosis, unspecified: Secondary | ICD-10-CM | POA: Diagnosis not present

## 2014-09-20 DIAGNOSIS — R7989 Other specified abnormal findings of blood chemistry: Secondary | ICD-10-CM | POA: Diagnosis not present

## 2014-09-20 DIAGNOSIS — Z6841 Body Mass Index (BMI) 40.0 and over, adult: Secondary | ICD-10-CM | POA: Diagnosis not present

## 2014-09-20 DIAGNOSIS — L918 Other hypertrophic disorders of the skin: Secondary | ICD-10-CM | POA: Diagnosis not present

## 2014-09-25 DIAGNOSIS — L659 Nonscarring hair loss, unspecified: Secondary | ICD-10-CM | POA: Diagnosis not present

## 2014-09-25 DIAGNOSIS — R21 Rash and other nonspecific skin eruption: Secondary | ICD-10-CM | POA: Diagnosis not present

## 2014-09-25 DIAGNOSIS — B372 Candidiasis of skin and nail: Secondary | ICD-10-CM | POA: Diagnosis not present

## 2014-10-16 DIAGNOSIS — H2513 Age-related nuclear cataract, bilateral: Secondary | ICD-10-CM | POA: Diagnosis not present

## 2014-10-16 DIAGNOSIS — H40013 Open angle with borderline findings, low risk, bilateral: Secondary | ICD-10-CM | POA: Diagnosis not present

## 2014-10-31 DIAGNOSIS — Z803 Family history of malignant neoplasm of breast: Secondary | ICD-10-CM | POA: Diagnosis not present

## 2014-10-31 DIAGNOSIS — Z1231 Encounter for screening mammogram for malignant neoplasm of breast: Secondary | ICD-10-CM | POA: Diagnosis not present

## 2014-10-31 DIAGNOSIS — M8580 Other specified disorders of bone density and structure, unspecified site: Secondary | ICD-10-CM | POA: Diagnosis not present

## 2014-11-07 DIAGNOSIS — M25511 Pain in right shoulder: Secondary | ICD-10-CM | POA: Diagnosis not present

## 2014-11-25 DIAGNOSIS — Z124 Encounter for screening for malignant neoplasm of cervix: Secondary | ICD-10-CM | POA: Diagnosis not present

## 2014-11-25 DIAGNOSIS — R87610 Atypical squamous cells of undetermined significance on cytologic smear of cervix (ASC-US): Secondary | ICD-10-CM | POA: Diagnosis not present

## 2014-11-25 DIAGNOSIS — Z01419 Encounter for gynecological examination (general) (routine) without abnormal findings: Secondary | ICD-10-CM | POA: Diagnosis not present

## 2014-12-18 ENCOUNTER — Other Ambulatory Visit: Payer: Self-pay | Admitting: Orthopaedic Surgery

## 2014-12-18 DIAGNOSIS — M25511 Pain in right shoulder: Secondary | ICD-10-CM

## 2014-12-31 ENCOUNTER — Ambulatory Visit
Admission: RE | Admit: 2014-12-31 | Discharge: 2014-12-31 | Disposition: A | Discharge status: Home / Self Care | Payer: Medicare Other | Source: Ambulatory Visit | Attending: Orthopaedic Surgery | Admitting: Orthopaedic Surgery

## 2014-12-31 DIAGNOSIS — M25511 Pain in right shoulder: Secondary | ICD-10-CM

## 2015-01-01 DIAGNOSIS — L820 Inflamed seborrheic keratosis: Secondary | ICD-10-CM | POA: Diagnosis not present

## 2015-01-01 DIAGNOSIS — L668 Other cicatricial alopecia: Secondary | ICD-10-CM | POA: Diagnosis not present

## 2015-01-03 DIAGNOSIS — M7551 Bursitis of right shoulder: Secondary | ICD-10-CM | POA: Diagnosis not present

## 2015-01-03 DIAGNOSIS — M19011 Primary osteoarthritis, right shoulder: Secondary | ICD-10-CM | POA: Diagnosis not present

## 2015-01-03 DIAGNOSIS — M25511 Pain in right shoulder: Secondary | ICD-10-CM | POA: Diagnosis not present

## 2015-01-03 DIAGNOSIS — M75121 Complete rotator cuff tear or rupture of right shoulder, not specified as traumatic: Secondary | ICD-10-CM | POA: Diagnosis not present

## 2015-01-06 DIAGNOSIS — Z79899 Other long term (current) drug therapy: Secondary | ICD-10-CM | POA: Diagnosis not present

## 2015-01-06 DIAGNOSIS — E782 Mixed hyperlipidemia: Secondary | ICD-10-CM | POA: Diagnosis not present

## 2015-01-07 NOTE — H&P (Signed)
Joni Fears, MD   Biagio Borg, PA-C [address], [place], [place]  [address]                             [phone]   ORTHOPAEDIC HISTORY & PHYSICAL  NARUMI FEWELL MRN:  [id number] DOB/SEX:  Aug 16, 1946/female  CHIEF COMPLAINT:  Painful right shoulder  HISTORY: Ms. Magana is 69 years old and is here for evaluation of right shoulder pain.  4 years ago she had a distal clavicle resection and arthroscopic SAD and then a mini-open rotator cuff tear repair.  She notes that she did well up until about a year ago and had a few aches and pains without a history of injury or trauma, but in the last month or 2 she has had pretty much "persistent pain."  Again, no history of injury or trauma.  The pain is along the anterior and lateral subacromial region.  She had some trouble raising her arm overhead and sleeping on her shoulder.  She has tried some meloxicam from her primary physician and notes it has made a little bit of a difference.  The MRI scan reveals that she has a new 3 mm tear of the supraspinatus.  It is somewhat thin.  She has some thinning and a little bit of atrophy of the subscapularis but the biceps tendon is fine.  She does have some chondromalacia of the glenoid.    PAST MEDICAL HISTORY: Patient Active Problem List   Diagnosis Date Noted  . Nausea alone 04/04/2013  . H/O adenomatous polyp of colon 04/04/2013  . FATTY LIVER DISEASE 11/11/2010  . RUQ PAIN 11/11/2010  . NONSPEC ELEVATION OF LEVELS OF TRANSAMINASE/LDH 11/11/2010  . COLONIC POLYPS, HYPERPLASTIC, HX OF 06/25/2010  . HYPOTHYROIDISM 06/24/2010  . ANEMIA-NOS 06/24/2010   Past Medical History  Diagnosis Date  . Acid reflux    Past Surgical History  Procedure Laterality Date  . Knee surgery      x2  . Rotator cuff repair      x2  . Trigger finger release    . Carpal tunnel release    . Colonoscopy  02/15/2005    [id number] small polyps ablated via cold biopsy, one from transverse colon and two  from the rectum/Small external hemorrhoids  . Colonoscopy  07/10/2010    [id number] rectum/long redundant colon, polyps in the sigmoid, descending, hepatic flexure/ADENOMATOUS POLYPS. next TCS due 06/2015  . Esophagogastroduodenoscopy  1995    Gastritis  . Colonoscopy  1995    3 polyps, path revealed chronic colitis  . Esophagogastroduodenoscopy N/A 04/16/2013    Procedure: ESOPHAGOGASTRODUODENOSCOPY (EGD);  Surgeon: Daneil Dolin, MD;  Location: [facility];  Service: Endoscopy;  Laterality: N/A;  3:30     MEDICATIONS:   No prescriptions prior to admission    ALLERGIES:   Allergies  Allergen Reactions  . Prednisone Other (See Comments)    Hallucinations   . Aspirin Nausea And Vomiting  . Celecoxib Rash    REVIEW OF SYSTEMS:  Gastrointestinal: positive for fatty liver   FAMILY HISTORY:   Family History  Problem Relation Age of Onset  . Cancer Mother   . Cancer Father   . Diabetes Sister   . Colon cancer Neg Hx   . Liver disease Neg Hx     SOCIAL HISTORY:   History  Substance Use Topics  . Smoking status: Former Smoker -- 56 years  Types: Cigarettes  . Smokeless tobacco: Former Systems developer    Quit date: 12/20/2004     Comment: 1.5 pack of cigarettes weekly  . Alcohol Use: No      EXAMINATION: Vital signs in last 24 hours:    Head is normocephalic.   Eyes:  Pupils equal, round and reactive to light and accommodation.  Extraocular intact. ENT: Ears, nose, and throat were benign.   Neck: supple, no bruits were noted.   Chest: good expansion.   Lungs: essentially clear.   Cardiac: regular rhythm and rate, normal S1, S2.  No murmurs appreciated. Pulses :  2+ bilateral and symmetric in upper extremities. Abdomen is scaphoid, soft, nontender, no masses palpable, normal bowel sounds  present. CNS:  He is oriented x3 and cranial nerves II-XII grossly intact. Breast, rectal, and genital exams: not performed and not indicated for an orthopedic  evaluation. Musculoskeletal:positive impingement.  She could raise her arm overhead but had a circuitous position.    Did not really have any local tenderness at the [facility] joint.Positive impingement with pain along the anterior and lateral subacromial region.  There is no evidence of instability.  No evidence of adhesive capsulitis.  She did have some areas of tenderness in the subacromial region both laterally as well as anteriorly.  There is no pain at the [facility] joint.    Imaging Review The MRI scan reveals that she has a new 3 mm tear of the supraspinatus.  It is somewhat thin.  She has some thinning and a little bit of atrophy of the subscapularis but the biceps tendon is fine.  She does have some chondromalacia of the glenoid.    ASSESSMENT: left recurrent rotator cuff tear  Past Medical History  Diagnosis Date  . Acid reflux     PLAN: Plan for left shoulder arthroscopy repeat the SAD, evaluate the joint, consider debriding any synovitis or any loose articular cartilage from either the humeral head or the glenoid, and then doing a mini-open rotator cuff tear repair.  She probably would require a patch   The procedure,  risks, and benefits of surgery were presented and reviewed. The risks including but not limited to infection, blood clots, vascular and nerve injury, stiffness,  among others were discussed. The patient acknowledged the explanation, agreed to proceed.   Mike Craze [place], [place] [phone]  01/07/2015 1:53 PM

## 2015-01-13 NOTE — Pre-Procedure Instructions (Signed)
AMILLIANA PASCHE  01/13/2015   Your procedure is scheduled on:  Tuesday, February 2nd  Report to [facility] ([place] Admitting at 530 AM.  Call this number if you have problems the morning of surgery: [phone]   Remember:   Do not eat food or drink liquids after midnight.   Take these medicines the morning of surgery with A SIP OF WATER: protonix, flonase if needed  Stop taking aspirin, OTC vitamins/herbal medications, NSAIDS (ibuprofen, advil, motrin) 7 days prior to surgery.    Do not wear jewelry, make-up or nail polish.  Do not wear lotions, powders, or perfume,deodorant.  Do not shave 48 hours prior to surgery. Men may shave face and neck.  Do not bring valuables to the hospital.  [facility] is not responsible  for any belongings or valuables.               Contacts, dentures or bridgework may not be worn into surgery.  Leave suitcase in the car. After surgery it may be brought to your room.  For patients admitted to the hospital, discharge time is determined by your treatment team.               Patients discharged the day of surgery will not be allowed to drive home.  Please read over the following fact sheets that you were given: Pain Booklet, Coughing and Deep Breathing and Surgical Site Infection Prevention  [place] - Preparing for Surgery  Before surgery, you can play an important role.  Because skin is not sterile, your skin needs to be as free of germs as possible.  You can reduce the number of germs on you skin by washing with CHG (chlorahexidine gluconate) soap before surgery.  CHG is an antiseptic cleaner which kills germs and bonds with the skin to continue killing germs even after washing.  Please DO NOT use if you have an allergy to CHG or antibacterial soaps.  If your skin becomes reddened/irritated stop using the CHG and inform your nurse when you arrive at Short Stay.  Do not shave (including legs and underarms) for at least 48 hours prior to the first  CHG shower.  You may shave your face.  Please follow these instructions carefully:   1.  Shower with CHG Soap the night before surgery and the morning of Surgery.  2.  If you choose to wash your hair, wash your hair first as usual with your normal shampoo.  3.  After you shampoo, rinse your hair and body thoroughly to remove the shampoo.  4.  Use CHG as you would any other liquid soap.  You can apply CHG directly to the skin and wash gently with scrungie or a clean washcloth.  5.  Apply the CHG Soap to your body ONLY FROM THE NECK DOWN.  Do not use on open wounds or open sores.  Avoid contact with your eyes, ears, mouth and genitals (private parts).  Wash genitals (private parts) with your normal soap.  6.  Wash thoroughly, paying special attention to the area where your surgery will be performed.  7.  Thoroughly rinse your body with warm water from the neck down.  8.  DO NOT shower/wash with your normal soap after using and rinsing off the CHG Soap.  9.  Pat yourself dry with a clean towel.            10.  Wear clean pajamas.  11.  Place clean sheets on your bed the night of your first shower and do not sleep with pets.  Day of Surgery  Do not apply any lotions/deoderants the morning of surgery.  Please wear clean clothes to the hospital/surgery center.

## 2015-01-14 ENCOUNTER — Encounter (HOSPITAL_COMMUNITY)
Admission: RE | Admit: 2015-01-14 | Discharge: 2015-01-14 | Disposition: A | Discharge status: Home / Self Care | Payer: Medicare Other | Source: Ambulatory Visit | Attending: Orthopaedic Surgery | Admitting: Orthopaedic Surgery

## 2015-01-14 ENCOUNTER — Encounter (HOSPITAL_COMMUNITY): Payer: Self-pay

## 2015-01-14 DIAGNOSIS — M75102 Unspecified rotator cuff tear or rupture of left shoulder, not specified as traumatic: Secondary | ICD-10-CM | POA: Diagnosis not present

## 2015-01-14 DIAGNOSIS — E039 Hypothyroidism, unspecified: Secondary | ICD-10-CM | POA: Insufficient documentation

## 2015-01-14 DIAGNOSIS — K760 Fatty (change of) liver, not elsewhere classified: Secondary | ICD-10-CM | POA: Insufficient documentation

## 2015-01-14 DIAGNOSIS — Z01812 Encounter for preprocedural laboratory examination: Secondary | ICD-10-CM | POA: Diagnosis not present

## 2015-01-14 DIAGNOSIS — Z87891 Personal history of nicotine dependence: Secondary | ICD-10-CM | POA: Diagnosis not present

## 2015-01-14 HISTORY — DX: Other specified disorders of nose and nasal sinuses: J34.89

## 2015-01-14 HISTORY — DX: Sleep apnea, unspecified: G47.30

## 2015-01-14 HISTORY — DX: Unspecified osteoarthritis, unspecified site: M19.90

## 2015-01-14 LAB — COMPREHENSIVE METABOLIC PANEL
ALBUMIN: 3.8 g/dL (ref 3.5–5.2)
ALK PHOS: 73 U/L (ref 39–117)
ALT: 21 U/L (ref 0–35)
AST: 32 U/L (ref 0–37)
Anion gap: 9 (ref 5–15)
BILIRUBIN TOTAL: 0.5 mg/dL (ref 0.3–1.2)
BUN: 13 mg/dL (ref 6–23)
CO2: 24 mmol/L (ref 19–32)
Calcium: 9.3 mg/dL (ref 8.4–10.5)
Chloride: 108 mmol/L (ref 96–112)
Creatinine, Ser: 1.18 mg/dL — ABNORMAL HIGH (ref 0.50–1.10)
GFR calc Af Amer: 54 mL/min — ABNORMAL LOW (ref >90)
GFR, EST NON AFRICAN AMERICAN: 47 mL/min — AB (ref >90)
Glucose, Bld: 89 mg/dL (ref 70–99)
Potassium: 3.9 mmol/L (ref 3.5–5.1)
SODIUM: 141 mmol/L (ref 135–145)
TOTAL PROTEIN: 7.5 g/dL (ref 6.0–8.3)

## 2015-01-14 LAB — CBC
HEMATOCRIT: 40.2 % (ref 36.0–46.0)
Hemoglobin: 12.6 g/dL (ref 12.0–15.0)
MCH: 30.1 pg (ref 26.0–34.0)
MCHC: 31.3 g/dL (ref 30.0–36.0)
MCV: 95.9 fL (ref 78.0–100.0)
PLATELETS: 370 10*3/uL (ref 150–400)
RBC: 4.19 MIL/uL (ref 3.87–5.11)
RDW: 13.8 % (ref 11.5–15.5)
WBC: 8.5 10*3/uL (ref 4.0–10.5)

## 2015-01-14 NOTE — Progress Notes (Signed)
Primary - [facility] - [place] No cardiologist No recent ekg or cardiac testing

## 2015-01-16 DIAGNOSIS — R739 Hyperglycemia, unspecified: Secondary | ICD-10-CM | POA: Diagnosis not present

## 2015-01-20 ENCOUNTER — Other Ambulatory Visit: Payer: Self-pay

## 2015-01-20 MED ORDER — CEFAZOLIN SODIUM-DEXTROSE 2-3 GM-% IV SOLR
2.0000 g | INTRAVENOUS | Status: AC
  Administered 2015-01-21: 2 g via INTRAVENOUS
  Filled 2015-01-20: qty 50

## 2015-01-20 MED ORDER — ACETAMINOPHEN 10 MG/ML IV SOLN
1000.0000 mg | Freq: Four times a day (QID) | INTRAVENOUS | Status: DC
  Administered 2015-01-21: 1000 mg via INTRAVENOUS

## 2015-01-20 MED ORDER — SODIUM CHLORIDE 0.9 % IV SOLN: INTRAVENOUS | Status: DC

## 2015-01-21 ENCOUNTER — Ambulatory Visit (HOSPITAL_COMMUNITY): Payer: Medicare Other | Admitting: Certified Registered Nurse Anesthetist

## 2015-01-21 ENCOUNTER — Encounter (HOSPITAL_COMMUNITY)
Admission: RE | Disposition: A | Discharge status: Home / Self Care | Payer: Self-pay | Source: Ambulatory Visit | Attending: Orthopaedic Surgery

## 2015-01-21 ENCOUNTER — Ambulatory Visit (HOSPITAL_COMMUNITY)
Admission: RE | Admit: 2015-01-21 | Discharge: 2015-01-21 | Disposition: A | Discharge status: Home / Self Care | Payer: Medicare Other | Source: Ambulatory Visit | Attending: Orthopaedic Surgery | Admitting: Orthopaedic Surgery

## 2015-01-21 DIAGNOSIS — M7541 Impingement syndrome of right shoulder: Secondary | ICD-10-CM | POA: Diagnosis not present

## 2015-01-21 DIAGNOSIS — G4730 Sleep apnea, unspecified: Secondary | ICD-10-CM | POA: Diagnosis not present

## 2015-01-21 DIAGNOSIS — K760 Fatty (change of) liver, not elsewhere classified: Secondary | ICD-10-CM | POA: Insufficient documentation

## 2015-01-21 DIAGNOSIS — Z87891 Personal history of nicotine dependence: Secondary | ICD-10-CM | POA: Insufficient documentation

## 2015-01-21 DIAGNOSIS — E039 Hypothyroidism, unspecified: Secondary | ICD-10-CM | POA: Diagnosis not present

## 2015-01-21 DIAGNOSIS — M1990 Unspecified osteoarthritis, unspecified site: Secondary | ICD-10-CM | POA: Diagnosis not present

## 2015-01-21 DIAGNOSIS — R110 Nausea: Secondary | ICD-10-CM | POA: Insufficient documentation

## 2015-01-21 DIAGNOSIS — K219 Gastro-esophageal reflux disease without esophagitis: Secondary | ICD-10-CM | POA: Insufficient documentation

## 2015-01-21 DIAGNOSIS — M75101 Unspecified rotator cuff tear or rupture of right shoulder, not specified as traumatic: Secondary | ICD-10-CM | POA: Diagnosis not present

## 2015-01-21 DIAGNOSIS — M24011 Loose body in right shoulder: Secondary | ICD-10-CM | POA: Diagnosis not present

## 2015-01-21 DIAGNOSIS — M75121 Complete rotator cuff tear or rupture of right shoulder, not specified as traumatic: Secondary | ICD-10-CM | POA: Diagnosis present

## 2015-01-21 HISTORY — PX: SHOULDER ARTHROSCOPY WITH ROTATOR CUFF REPAIR AND SUBACROMIAL DECOMPRESSION: SHX5686

## 2015-01-21 SURGERY — SHOULDER ARTHROSCOPY WITH ROTATOR CUFF REPAIR AND SUBACROMIAL DECOMPRESSION
Anesthesia: General | Site: Shoulder | Laterality: Right

## 2015-01-21 MED ORDER — PHENYLEPHRINE HCL 10 MG/ML IJ SOLN
10.0000 mg | INTRAVENOUS | Status: DC | PRN
  Administered 2015-01-21: 20 ug/min via INTRAVENOUS

## 2015-01-21 MED ORDER — PROPOFOL 10 MG/ML IV BOLUS
INTRAVENOUS | Status: AC
  Filled 2015-01-21: qty 20

## 2015-01-21 MED ORDER — LACTATED RINGERS IV SOLN
INTRAVENOUS | Status: DC | PRN
  Administered 2015-01-21 (×2): via INTRAVENOUS

## 2015-01-21 MED ORDER — LIDOCAINE-EPINEPHRINE (PF) 1 %-1:200000 IJ SOLN
INTRAMUSCULAR | Status: AC
  Filled 2015-01-21: qty 10

## 2015-01-21 MED ORDER — ONDANSETRON HCL 4 MG/2ML IJ SOLN
INTRAMUSCULAR | Status: DC | PRN
  Administered 2015-01-21: 4 mg via INTRAVENOUS

## 2015-01-21 MED ORDER — SODIUM CHLORIDE 0.9 % IJ SOLN
INTRAMUSCULAR | Status: AC
  Filled 2015-01-21: qty 10

## 2015-01-21 MED ORDER — OXYCODONE HCL 5 MG PO TABS: 5.0000 mg | ORAL_TABLET | Freq: Once | ORAL | Status: DC | PRN

## 2015-01-21 MED ORDER — GLYCOPYRROLATE 0.2 MG/ML IJ SOLN
INTRAMUSCULAR | Status: DC | PRN
  Administered 2015-01-21: 0.4 mg via INTRAVENOUS

## 2015-01-21 MED ORDER — FENTANYL CITRATE 0.05 MG/ML IJ SOLN
INTRAMUSCULAR | Status: AC
  Filled 2015-01-21: qty 5

## 2015-01-21 MED ORDER — MIDAZOLAM HCL 2 MG/2ML IJ SOLN
INTRAMUSCULAR | Status: DC | PRN
  Administered 2015-01-21: 2 mg via INTRAVENOUS

## 2015-01-21 MED ORDER — BUPIVACAINE HCL (PF) 0.25 % IJ SOLN
INTRAMUSCULAR | Status: AC
  Filled 2015-01-21: qty 30

## 2015-01-21 MED ORDER — FENTANYL CITRATE 0.05 MG/ML IJ SOLN
INTRAMUSCULAR | Status: DC | PRN
  Administered 2015-01-21: 100 ug via INTRAVENOUS
  Administered 2015-01-21: 50 ug via INTRAVENOUS

## 2015-01-21 MED ORDER — DIPHENHYDRAMINE HCL 50 MG/ML IJ SOLN
INTRAMUSCULAR | Status: AC
  Filled 2015-01-21: qty 1

## 2015-01-21 MED ORDER — PROMETHAZINE HCL 25 MG/ML IJ SOLN: 6.2500 mg | INTRAMUSCULAR | Status: DC | PRN

## 2015-01-21 MED ORDER — SODIUM CHLORIDE 0.9 % IR SOLN
Status: DC | PRN
  Administered 2015-01-21: 3000 mL

## 2015-01-21 MED ORDER — ACETAMINOPHEN 10 MG/ML IV SOLN
INTRAVENOUS | Status: AC
  Filled 2015-01-21: qty 100

## 2015-01-21 MED ORDER — NEOSTIGMINE METHYLSULFATE 10 MG/10ML IV SOLN
INTRAVENOUS | Status: DC | PRN
  Administered 2015-01-21: 3 mg via INTRAVENOUS

## 2015-01-21 MED ORDER — OXYCODONE HCL 5 MG/5ML PO SOLN: 5.0000 mg | Freq: Once | ORAL | Status: DC | PRN

## 2015-01-21 MED ORDER — MIDAZOLAM HCL 2 MG/2ML IJ SOLN
INTRAMUSCULAR | Status: AC
  Filled 2015-01-21: qty 2

## 2015-01-21 MED ORDER — EPHEDRINE SULFATE 50 MG/ML IJ SOLN
INTRAMUSCULAR | Status: AC
  Filled 2015-01-21: qty 1

## 2015-01-21 MED ORDER — ROCURONIUM BROMIDE 100 MG/10ML IV SOLN
INTRAVENOUS | Status: DC | PRN
Start: 2015-01-21 — End: 2015-01-21
  Administered 2015-01-21: 30 mg via INTRAVENOUS

## 2015-01-21 MED ORDER — DEXAMETHASONE SODIUM PHOSPHATE 4 MG/ML IJ SOLN
INTRAMUSCULAR | Status: DC | PRN
  Administered 2015-01-21: 4 mg via INTRAVENOUS

## 2015-01-21 MED ORDER — SUCCINYLCHOLINE CHLORIDE 20 MG/ML IJ SOLN
INTRAMUSCULAR | Status: AC
  Filled 2015-01-21: qty 1

## 2015-01-21 MED ORDER — ROCURONIUM BROMIDE 50 MG/5ML IV SOLN
INTRAVENOUS | Status: AC
  Filled 2015-01-21: qty 1

## 2015-01-21 MED ORDER — OXYCODONE-ACETAMINOPHEN 5-325 MG PO TABS: 1.0000 | ORAL_TABLET | ORAL | Status: DC | PRN

## 2015-01-21 MED ORDER — DIPHENHYDRAMINE HCL 50 MG/ML IJ SOLN
INTRAMUSCULAR | Status: DC | PRN
  Administered 2015-01-21: 6 mg via INTRAVENOUS

## 2015-01-21 MED ORDER — HYDROMORPHONE HCL 1 MG/ML IJ SOLN: 0.2500 mg | INTRAMUSCULAR | Status: DC | PRN

## 2015-01-21 MED ORDER — SODIUM CHLORIDE 0.9 % IR SOLN
Status: DC | PRN
  Administered 2015-01-21: 1000 mL

## 2015-01-21 MED ORDER — PROPOFOL 10 MG/ML IV BOLUS
INTRAVENOUS | Status: DC | PRN
  Administered 2015-01-21: 150 mg via INTRAVENOUS

## 2015-01-21 MED ORDER — SUCCINYLCHOLINE CHLORIDE 20 MG/ML IJ SOLN
INTRAMUSCULAR | Status: DC | PRN
  Administered 2015-01-21: 100 mg via INTRAVENOUS

## 2015-01-21 MED ORDER — SCOPOLAMINE 1 MG/3DAYS TD PT72
MEDICATED_PATCH | TRANSDERMAL | Status: DC | PRN
  Administered 2015-01-21: 1 via TRANSDERMAL

## 2015-01-21 MED ORDER — CHLORHEXIDINE GLUCONATE 4 % EX LIQD
60.0000 mL | Freq: Once | CUTANEOUS | Status: DC
  Filled 2015-01-21: qty 60

## 2015-01-21 SURGICAL SUPPLY — 50 items
ANCHOR PEEK ALL THREAD (Anchor) ×2 IMPLANT
BENZOIN TINCTURE PRP APPL 2/3 (GAUZE/BANDAGES/DRESSINGS) ×4 IMPLANT
BLADE GREAT WHITE 4.2 (BLADE) ×2 IMPLANT
BLADE SURG 11 STRL SS (BLADE) ×2 IMPLANT
BNDG COHESIVE 4X5 TAN STRL (GAUZE/BANDAGES/DRESSINGS) ×2 IMPLANT
BUR OVAL 6.0 (BURR) ×2 IMPLANT
CANNULA ACUFLEX KIT 5X76 (CANNULA) ×2 IMPLANT
COVER SURGICAL LIGHT HANDLE (MISCELLANEOUS) ×2 IMPLANT
DERMASPAN .5-.9MM 4X4CM SHOU (Miscellaneous) ×2 IMPLANT
DRAPE SHOULDER BEACH CHAIR (DRAPES) ×2 IMPLANT
DRSG PAD ABDOMINAL 8X10 ST (GAUZE/BANDAGES/DRESSINGS) ×4 IMPLANT
DURAPREP 26ML APPLICATOR (WOUND CARE) ×2 IMPLANT
ELECT NEEDLE TIP 2.8 STRL (NEEDLE) ×2 IMPLANT
ELECT REM PT RETURN 9FT ADLT (ELECTROSURGICAL) ×2
ELECTRODE REM PT RTRN 9FT ADLT (ELECTROSURGICAL) ×1 IMPLANT
GAUZE SPONGE 4X4 12PLY STRL (GAUZE/BANDAGES/DRESSINGS) ×2 IMPLANT
GLOVE BIOGEL PI IND STRL 8 (GLOVE) ×1 IMPLANT
GLOVE BIOGEL PI IND STRL 8.5 (GLOVE) ×1 IMPLANT
GLOVE BIOGEL PI INDICATOR 8 (GLOVE) ×1
GLOVE BIOGEL PI INDICATOR 8.5 (GLOVE) ×1
GLOVE ECLIPSE 8.0 STRL XLNG CF (GLOVE) ×2 IMPLANT
GLOVE SURG ORTHO 8.5 STRL (GLOVE) ×4 IMPLANT
GOWN STRL REUS W/ TWL LRG LVL3 (GOWN DISPOSABLE) ×2 IMPLANT
GOWN STRL REUS W/TWL 2XL LVL3 (GOWN DISPOSABLE) ×2 IMPLANT
GOWN STRL REUS W/TWL LRG LVL3 (GOWN DISPOSABLE) ×2
KIT ROOM TURNOVER OR (KITS) ×2 IMPLANT
MANIFOLD NEPTUNE II (INSTRUMENTS) ×2 IMPLANT
NEEDLE 22X1 1/2 (OR ONLY) (NEEDLE) IMPLANT
NEEDLE SPNL 18GX3.5 QUINCKE PK (NEEDLE) ×2 IMPLANT
NS IRRIG 1000ML POUR BTL (IV SOLUTION) ×2 IMPLANT
PACK SHOULDER (CUSTOM PROCEDURE TRAY) ×2 IMPLANT
PAD ARMBOARD 7.5X6 YLW CONV (MISCELLANEOUS) ×4 IMPLANT
SET ARTHROSCOPY TUBING (MISCELLANEOUS) ×1
SET ARTHROSCOPY TUBING LN (MISCELLANEOUS) ×1 IMPLANT
SLING ARM IMMOBILIZER LRG (SOFTGOODS) ×2 IMPLANT
STRIP CLOSURE SKIN 1/2X4 (GAUZE/BANDAGES/DRESSINGS) ×2 IMPLANT
SUT ETHIBOND 2 0 SH (SUTURE) IMPLANT
SUT ETHIBOND 2 OS 4 DA (SUTURE) ×2 IMPLANT
SUT ETHIBOND X763 2 0 SH 1 (SUTURE) ×4 IMPLANT
SUT ETHILON 4 0 PS 2 18 (SUTURE) IMPLANT
SUT MNCRL AB 3-0 PS2 18 (SUTURE) ×2 IMPLANT
SUT VIC AB 0 CT1 27 (SUTURE) ×1
SUT VIC AB 0 CT1 27XBRD ANBCTR (SUTURE) ×1 IMPLANT
SUT VICRYL 0 UR6 27IN ABS (SUTURE) ×2 IMPLANT
SYR CONTROL 10ML LL (SYRINGE) IMPLANT
TOWEL OR 17X24 6PK STRL BLUE (TOWEL DISPOSABLE) ×2 IMPLANT
TOWEL OR 17X26 10 PK STRL BLUE (TOWEL DISPOSABLE) ×2 IMPLANT
TUBE CONNECTING 12X1/4 (SUCTIONS) ×4 IMPLANT
WAND HAND CNTRL MULTIVAC 90 (MISCELLANEOUS) ×2 IMPLANT
WATER STERILE IRR 1000ML POUR (IV SOLUTION) ×2 IMPLANT

## 2015-01-21 NOTE — Addendum Note (Signed)
Addendum  created 01/21/15 1229 by Leonor Liv, CRNA   Modules edited: Charges VN

## 2015-01-21 NOTE — Discharge Instructions (Signed)
General Anesthesia, Adult, Care After  Refer to this sheet in the next few weeks. These instructions provide you with information on caring for yourself after your procedure. Your health care provider may also give you more specific instructions. Your treatment has been planned according to current medical practices, but problems sometimes occur. Call your health care provider if you have any problems or questions after your procedure.  WHAT TO EXPECT AFTER THE PROCEDURE  After the procedure, it is typical to experience:  Sleepiness.  Nausea and vomiting. HOME CARE INSTRUCTIONS  For the first 24 hours after general anesthesia:  Have a responsible person with you.  Do not drive a car. If you are alone, do not take public transportation.  Do not drink alcohol.  Do not take medicine that has not been prescribed by your health care provider.  Do not sign important papers or make important decisions.  You may resume a normal diet and activities as directed by your health care provider.  Change bandages (dressings) as directed.  If you have questions or problems that seem related to general anesthesia, call the hospital and ask for the anesthetist or anesthesiologist on call. SEEK MEDICAL CARE IF:  You have nausea and vomiting that continue the day after anesthesia.  You develop a rash. SEEK IMMEDIATE MEDICAL CARE IF:  You have difficulty breathing.  You have chest pain.  You have any allergic problems. Document Released: 03/14/2001 Document Revised: 08/08/2013 Document Reviewed: 06/21/2013  [facility] Patient Information 2014 [place], [place].   What to eat:  For your first meals, you should eat lightly; only small meals initially.  If you do not have nausea, you may eat larger meals.  Avoid spicy, greasy and heavy food.

## 2015-01-21 NOTE — Transfer of Care (Signed)
Immediate Anesthesia Transfer of Care Note  Patient: Cheryl [name]  Procedure(s) Performed: Procedure(s): RIGHT SHOULDER ARTHROSCOPIC DEBRIDEMNT OF G-H JOINT AND REMOVAL OF LOOSE BODIES,ARTHROSCOPIC SUBACROMIAL DECOMPRESSION,MINI OPEN RCT REPAIR WITH SUPPLEMENTAL DERMASPAN PATCH (Right)  Patient Location: PACU  Anesthesia Type:General  Level of Consciousness: sedated  Airway & Oxygen Therapy: Patient Spontanous Breathing and Patient connected to nasal cannula oxygen  Post-op Assessment: Report given to RN, Post -op Vital signs reviewed and stable and Patient moving all extremities  Post vital signs: Reviewed and stable  Last Vitals:  Filed Vitals:   01/21/15 0927  BP:   Pulse: 74  Temp: 36.7 C  Resp: 11    Complications: No apparent anesthesia complications

## 2015-01-21 NOTE — Anesthesia Postprocedure Evaluation (Signed)
Anesthesia Post Note  Patient: Cheryl [name]  Procedure(s) Performed: Procedure(s) (LRB): RIGHT SHOULDER ARTHROSCOPIC DEBRIDEMNT OF G-H JOINT AND REMOVAL OF LOOSE BODIES,ARTHROSCOPIC SUBACROMIAL DECOMPRESSION,MINI OPEN RCT REPAIR WITH SUPPLEMENTAL DERMASPAN PATCH (Right)  Anesthesia type: general  Patient location: PACU  Post pain: Pain level controlled  Post assessment: Patient's Cardiovascular Status Stable  Last Vitals:  Filed Vitals:   01/21/15 1015  BP: 143/82  Pulse: 70  Temp:   Resp: 15    Post vital signs: Reviewed and stable  Level of consciousness: sedated  Complications: No apparent anesthesia complications

## 2015-01-21 NOTE — Anesthesia Preprocedure Evaluation (Addendum)
Anesthesia Evaluation  Patient identified by MRN, date of birth, ID band Patient awake    Reviewed: Allergy & Precautions, NPO status , Patient's Chart, lab work & pertinent test results, Unable to perform ROS - Chart review only  History of Anesthesia Complications Negative for: history of anesthetic complications  Airway Mallampati: III  TM Distance: >3 FB Neck ROM: Full    Dental  (+) Teeth Intact, Dental Advisory Given   Pulmonary sleep apnea , former smoker,    Pulmonary exam normal       Cardiovascular negative cardio ROS      Neuro/Psych negative neurological ROS  negative psych ROS   GI/Hepatic Neg liver ROS, GERD-  Medicated,  Endo/Other  Hypothyroidism   Renal/GU negative Renal ROS     Musculoskeletal  (+) Arthritis -,   Abdominal   Peds  Hematology   Anesthesia Other Findings   Reproductive/Obstetrics                            Anesthesia Physical Anesthesia Plan  ASA: III  Anesthesia Plan: General   Post-op Pain Management:    Induction: Intravenous and Rapid sequence  Airway Management Planned: Oral ETT  Additional Equipment:   Intra-op Plan:   Post-operative Plan: Extubation in OR  Informed Consent: I have reviewed the patients History and Physical, chart, labs and discussed the procedure including the risks, benefits and alternatives for the proposed anesthesia with the patient or authorized representative who has indicated his/her understanding and acceptance.   Dental advisory given  Plan Discussed with: CRNA, Anesthesiologist and Surgeon  Anesthesia Plan Comments: (Potential for difficult intubation discussed and video laryngoscopy available if needed.)       Anesthesia Quick Evaluation

## 2015-01-21 NOTE — Op Note (Signed)
PATIENT ID:      DARLISA WALKENHORST  MRN:     [id number] DOB/AGE:    February 22, 1946 / 69 y.o.       OPERATIVE REPORT    DATE OF PROCEDURE:  01/21/2015       PREOPERATIVE DIAGNOSIS:   RIGHT SHOULDER RECURRENT ROTATOR CUFF TEAR WITH IMPINGEMENT,DJD G-H JOINT                                                       Estimated body mass index is 48.45 kg/(m^2) as calculated from the following:   Height as of 01/14/15: 4\' 11"  (1.499 m).   Weight as of 10/13/13: 108.863 kg (240 lb).     POSTOPERATIVE DIAGNOSIS:   RIGHT SHOULDER ROTATOR CUFF TEAR WITH IMPINGEMENT,DJD G-H JOINT WITH NUMEROUS CARTILAGINOUS LOOSE BODIES ,ADHESIVE CAPSULITIS                                                                    Estimated body mass index is 48.45 kg/(m^2) as calculated from the following:   Height as of 01/14/15: 4\' 11"  (1.499 m).   Weight as of 10/13/13: 108.863 kg (240 lb).     PROCEDURE EUA RIGHT SHOULDER WITH MANIPULATION,ARTHROSCOPIC DEBRIDEMENT OF, SAD G-H JOINT AND REMOVAL OF LOOSE BODIES, SAD,MINI OPEN RCT REPAIR WITH SUPPLEMENTAL DERMASPAN PATCH     SURGEON:  Joni Fears, MD    ASSISTANT:   Biagio Borg, PA-C   (Present and scrubbed throughout the case, critical for assistance with exposure, retraction, instrumentation, and closure.)          ANESTHESIA: regional and general     DRAINS: none :      TOURNIQUET TIME: * No tourniquets in log *    COMPLICATIONS:  None   CONDITION:  stable  PROCEDURE IN DETAIL: [id number]   Sonoita, Coralynn Gaona W 01/21/2015, 9:05 AM

## 2015-01-21 NOTE — Anesthesia Procedure Notes (Signed)
Procedure Name: Intubation Date/Time: 01/21/2015 7:27 AM Performed by: Trixie Deis A Pre-anesthesia Checklist: Patient identified, Timeout performed, Emergency Drugs available, Suction available and Patient being monitored Patient Re-evaluated:Patient Re-evaluated prior to inductionOxygen Delivery Method: Circle system utilized Preoxygenation: Pre-oxygenation with 100% oxygen Intubation Type: IV induction Ventilation: Mask ventilation without difficulty and Oral airway inserted - appropriate to patient size Grade View: Grade II Tube type: Oral Tube size: 7.0 mm Number of attempts: 3 Airway Equipment and Method: Rigid stylet and Video-laryngoscopy Placement Confirmation: ETT inserted through vocal cords under direct vision,  breath sounds checked- equal and bilateral and positive ETCO2 Secured at: 21 cm Tube secured with: Tape Dental Injury: Teeth and Oropharynx as per pre-operative assessment  Difficulty Due To: Difficulty was anticipated and Difficult Airway- due to anterior larynx Comments: Difficult to intubate due to small neck, anterior airway, obesity. DLx1 MAC 3 by CRNA revealed arytenoids but unable to pass OETT. DLx1 by MDA Mil 2 revealed grade II view unable to pass OETT. Glidescope used x1 grade I view, noted small glottic opening. Able to pass 7.0 OETT without issue. Recommend Glidescope in the future.

## 2015-01-21 NOTE — H&P (Signed)
  The recent History & Physical has been reviewed. I have personally examined the patient today. There is no interval change to the documented History & Physical. The patient would like to proceed with the procedure.  Cheryl [name] W 01/21/2015,  7:05 AM

## 2015-01-22 MED ORDER — PANTOPRAZOLE SODIUM 40 MG PO TBEC: 40.0000 mg | DELAYED_RELEASE_TABLET | Freq: Every day | ORAL | Status: DC

## 2015-01-22 NOTE — Op Note (Signed)
NAMEARLETHA, MARSCHKE Cheryl.:  [id number]  MEDICAL RECORD Cheryl.:  [id number]  LOCATION:  [facility]                         FACILITY:  [facility]  PHYSICIAN:  Vonna Kotyk. Whitfield, M.D.DATE OF BIRTH:  Aug 09, 1946  DATE OF PROCEDURE:  01/21/2015 DATE OF DISCHARGE:  01/21/2015                              OPERATIVE REPORT   PREOPERATIVE DIAGNOSES: 1. Recurrent rotator cuff tear, right shoulder with impingement. 2. Degenerative joint disease, glenohumeral joint.  POSTOPERATIVE DIAGNOSES: 1. Recurrent rotator cuff tear, right shoulder with impingement. 2. Degenerative joint disease, glenohumeral joint. 3. Adhesive capsulitis.  PROCEDURES: 1. Examination of right shoulder under anesthesia with manipulation. 2. Diagnostic arthroscopy, glenohumeral joint with debridement of     synovitis and removal of numerous loose bodies. 3. Arthroscopic subacromial decompression. 4. Mini open rotator cuff tear repair with supplemental DermaSpan     patch.  SURGEON:  Vonna Kotyk. Durward Fortes, M.D.  ASSISTANT:  Avelina Laine, P.A.-C.  ANESTHESIA:  General with supplemental interscalene nerve block.  COMPLICATIONS:  None.  INDICATIONS:  A 69 year old female is approximately 4 years status post rotator cuff tear repair of right shoulder with an arthroscopic distal clavicle resection and subacromial decompression.  She actually has done well up until the last several months when she has had some recurrent discomfort despite conservative treatment and cortisone injection, she has not responded.  Accordingly, she had an MRI scan revealing a recurrent rotator cuff tear.  She also has evidence of degenerative changes within the glenohumeral joint, which certainly at least partly responsible for pain, but she is now to have a repeat arthroscopic evaluation and mini open rotator cuff tear repair with probable application of DermaSpan patch.  DESCRIPTION OF PROCEDURE:  Cheryl [name] was met in the holding  area, identified the right shoulder as appropriate operative site and marked it accordingly.  Anesthesia performed an interscalene nerve block.  The patient was then transported to room #14 and placed under general anesthesia.  She was then placed in a semi-sitting position with the shoulder frame.  Time-out was called.  Examination of the right shoulder was then performed with evidence of adhesive capsulitis and gentle manipulation was performed with complete overhead, flexion and abduction.  The right shoulder was then prepped with DuraPrep from the base of the neck circumferentially to below the elbow.  Sterile draping was performed.  Marking pen was used to outline the [facility] joint, the coracoid and the acromion, at a point, a fingerbreadth posterior and medial to the posterior angle acromion and in the same area as previously formed a small puncture, wound was then obtained with an 11-blade knife. Arthroscope was then easily placed into the shoulder joint.  There was a hemarthrosis from the manipulation.  This was evacuated with a cannula placed along the anterior aspect of the joint through the old arthroscopic portal.  The joint was then explored.  There was considerable degenerative change particularly along the glenoid where there were areas of grade 4 changes in numerous small articular loose bodies.  The Cuda shaver was introduced and the loose bodies were removed.  There was considerable loose cartilage along and fraying of articular cartilage along the humeral head representing what  appeared to be at least grade 4 changes on the glenoid.  There was abundant synovitis, which I debrided and there was an obvious recurrent rotator cuff tear as I could visualize the subacromial space.  The biceps tendon appeared to be intact as did the subscapularis.  The arthroscope was then placed to subacromial space posteriorly, the cannula in the subacromial space anteriorly and a third  portal was established in the lateral subacromial space.  An arthroscopic subacromial decompression was performed.  There was a moderate amount of inflammatory bursal tissue that was resected with the ArthroCare wand. There was some recurrent spur formation beneath the acromion and this was shaved with a 6-mm bur, noted nice resection.  AC joint had been adequately resected from previous surgery and was left alone and I could visualize the cuff tear through the bursal surface.  A mini open rotator cuff tear repair was then performed.  The old incision was then utilized, size down to subcutaneous tissue with an 11- blade knife.  By further blunt dissection, the raphe and the deltoid were identified and incised.  The subacromial space was entered.  A Chung retractor was inserted.  The cuff tear was identified.  There was some old FiberWire that was removed.  The edges were debrided.  I obtained nice bleeding bone along the leading edge and then I sutured the cuff tear from its apex to the roughened area over the humeral head. I inserted a single Biomet PEEK anchor with attached FiberWire to further secure the cuff tear.  Because it looked atrophic and because it was a recurrent tear, I elected to apply a DermaSpan patch.  This was reconstituted in saline for 10 minutes and then sutured in place with 2-0 Ethibond suture under tension.  Had very nice repair, covered the entire tear and prior defect.  Wound was irrigated with saline solution.  The deltoid fascia was closed with a running 0 Vicryl, subcu with 3-0 Monocryl.  Skin was closed with Steri-Strips over benzoin.  Sterile bulky dressing was applied by sling.  PLAN:  Hydrocodone for pain and office in 1 week.     Vonna Kotyk. Durward Fortes, M.D.    PWW/MEDQ  D:  01/21/2015  T:  01/22/2015  Job:  [id number]

## 2015-01-23 ENCOUNTER — Encounter (HOSPITAL_COMMUNITY): Payer: Self-pay | Admitting: Orthopaedic Surgery

## 2015-02-20 ENCOUNTER — Other Ambulatory Visit: Payer: Self-pay

## 2015-02-20 ENCOUNTER — Ambulatory Visit: Payer: Medicare Other | Attending: Orthopaedic Surgery | Admitting: Physical Therapy

## 2015-02-20 DIAGNOSIS — R531 Weakness: Secondary | ICD-10-CM

## 2015-02-20 DIAGNOSIS — M6281 Muscle weakness (generalized): Secondary | ICD-10-CM | POA: Diagnosis not present

## 2015-02-20 DIAGNOSIS — Z9889 Other specified postprocedural states: Secondary | ICD-10-CM | POA: Diagnosis not present

## 2015-02-20 DIAGNOSIS — M25511 Pain in right shoulder: Secondary | ICD-10-CM | POA: Diagnosis not present

## 2015-02-20 DIAGNOSIS — M25611 Stiffness of right shoulder, not elsewhere classified: Secondary | ICD-10-CM

## 2015-02-20 NOTE — Therapy (Addendum)
[place], [place], [address] Phone: [phone]   Fax:  [phone]  Physical Therapy Evaluation  Patient Details  Name: Cheryl [name] MRN: [id number] Date of Birth: 1946-11-14 Referring Provider:  Garald Balding, MD  Encounter Date: 02/20/2015      PT End of Session - 02/20/15 1128    Visit Number 1   Number of Visits 16   Date for PT Re-Evaluation 04/17/15   PT Start Time 1016   PT Stop Time 1110   PT Time Calculation (min) 54 min   Activity Tolerance Patient tolerated treatment well   Behavior During Therapy [facility] for tasks assessed/performed      Past Medical History  Diagnosis Date  . Acid reflux   . Sleep apnea     had surgery to correct  . Sinus drainage   . Arthritis     Past Surgical History  Procedure Laterality Date  . Knee surgery      x2  . Rotator cuff repair      x2  . Trigger finger release    . Carpal tunnel release    . Colonoscopy  02/15/2005    [id number] small polyps ablated via cold biopsy, one from transverse colon and two from the rectum/Small external hemorrhoids  . Colonoscopy  07/10/2010    [id number] rectum/long redundant colon, polyps in the sigmoid, descending, hepatic flexure/ADENOMATOUS POLYPS. next TCS due 06/2015  . Esophagogastroduodenoscopy  1995    Gastritis  . Colonoscopy  1995    3 polyps, path revealed chronic colitis  . Esophagogastroduodenoscopy N/A 04/16/2013    Procedure: ESOPHAGOGASTRODUODENOSCOPY (EGD);  Surgeon: Daneil Dolin, MD;  [facility];  Service: Endoscopy;  Laterality: N/A;  3:30  . Uvulopalatopharyngoplasty    . Shoulder arthroscopy with rotator cuff repair and subacromial decompression Right 01/21/2015    Procedure: RIGHT SHOULDER ARTHROSCOPIC DEBRIDEMNT OF G-H JOINT AND REMOVAL OF LOOSE BODIES,ARTHROSCOPIC SUBACROMIAL DECOMPRESSION,MINI OPEN RCT REPAIR WITH SUPPLEMENTAL [facility] PATCH;  Surgeon: Garald Balding, MD;   Location: [facility];  Service: Orthopedics;  Laterality: Right;    There were no vitals taken for this visit.  Visit Diagnosis:  Pain in joint, shoulder region, right - Plan: PT plan of care cert/re-cert  Decreased ROM of right shoulder - Plan: PT plan of care cert/re-cert  Decreased strength - Plan: PT plan of care cert/re-cert  S/P arthroscopy of shoulder - Plan: PT plan of care cert/re-cert  S/P rotator cuff repair - Plan: PT plan of care cert/re-cert      Subjective Assessment - 02/20/15 1024    Symptoms Pt having discomfort in Right shoulder.  Pt wearing sling and accompanied by goddaughter to be instructed with PROM for Right Arthroscopy with miniRCT/SAD with supplemental dermascan patch   Pertinent History Pt has sleep apnea, acid reflux    How long can you sit comfortably? unlimited   Patient Stated Goals be able to use my arm for ADLs and household chores   Currently in Pain? Yes   Pain Score 5    Pain Location Shoulder   Pain Orientation Right   Pain Descriptors / Indicators Aching;Sore   Pain Type Surgical pain;Acute pain   Pain Onset 1 to 4 weeks ago   Aggravating Factors  sleeping, coldness, unable to move because of sling   Pain Relieving Factors ice          [facility] PT Assessment - 02/20/15 0001    Assessment  Medical Diagnosis Right RTC with SAD and complete RCT   Onset Date 01/21/15   Next MD Visit 02-24-15   Prior Therapy yes   1 on Left,  2 on the Right   Precautions   Precautions Shoulder   Type of Shoulder Precautions only PROM   Precaution Comments No internal rotation until cleared by MD   Required Braces or Orthoses Sling   Balance Screen   Has the patient fallen in the past 6 months Yes  Pt slipped on water on board ,1 x missing step into house   How many times? 2   Has the patient had a decrease in activity level because of a fear of falling?  No   Is the patient reluctant to leave their home because of a fear of falling?  No   Home Environment    Living Enviornment Private residence   Living Arrangements Spouse/significant other   Home Access Stairs to enter   Entrance Stairs-Number of Steps 2   Entrance Stairs-Rails None   Home Layout One level   Prior Function   Level of Independence Independent with basic ADLs;Independent with homemaking with ambulation;Independent with gait;Independent with homemaking with wheelchair   Vocation Retired   Associate Professor   Overall Cognitive Status Within Functional Limits for tasks assessed   Observation/Other Assessments   Observations Pt sleeping on couch with R arm supported by pillow   Focus on Therapeutic Outcomes (FOTO)  intake 45%  liimitation 55% predicted 37%   Posture/Postural Control   Posture/Postural Control Postural limitations   Postural Limitations Rounded Shoulders;Forward head  obese   PROM   Right Shoulder Flexion 165 Degrees   Right Shoulder ABduction 158 Degrees   Right Shoulder Internal Rotation 45 Degrees   Right Shoulder External Rotation 90 Degrees   Left Shoulder Flexion 125 Degrees  ERP   Left Shoulder ABduction 120115 Degrees  ERP   Left Shoulder Internal Rotation 10 Degrees   Left Shoulder External Rotation 45 Degrees   Strength   Overall Strength Unable to assess   Overall Strength Comments Left shouder grossly 4to 4+/5                  [facility] Adult PT Treatment/Exercise - 02/20/15 0001    Posture/Postural Control   Posture Comments Pt educated on sleeping and sitting support for comfort   Manual Therapy   Manual Therapy Passive ROM   Passive ROM flexion/abduction/ER with elbow extended and flexed.    Caregiver instructed and return demo                PT Education - 02/20/15 1125    Education provided Yes   Education Details Pt and caregiver instructed in how to perform PROM in Right shoulder flex, abduction and ER with elbow bend and straight.  Kim( caregiver) able to return demo.  Pt also instructed in positioning for comfort for  sleeping and sitting up.  Pt using ice at home but given additional information for Cryotherapy to reinforce from surgery instructions   Person(s) Educated Patient;Caregiver(s)   Methods Explanation;Demonstration;Tactile cues;Verbal cues;Handout   Comprehension Verbalized understanding;Returned demonstration;Need further instruction;Verbal cues required          PT Short Term Goals - 02/20/15 1356    PT SHORT TERM GOAL #1   Title "Independent with initial HEP   Time 4   Period Weeks   Status New   PT SHORT TERM GOAL #2   Title "Report pain decrease from  5  /  10 to   3 /10.   Time 4   Period Weeks   Status New   PT SHORT TERM GOAL #3   Title "Demonstrate and verbalize understanding of condition management including RICE, positioning, HEP.    Time 4   Period Weeks   Status New   PT SHORT TERM GOAL #4   Title Pt caregiver will be able to correctly execute PROM for Right shoulder for pt    Time 2   Period Weeks   Status New           PT Long Term Goals - 03/18/15 1359    PT LONG TERM GOAL #1   Title "Pt will be independent with advanced HEP.    Time 8   Period Weeks   Status New   PT LONG TERM GOAL #2   Title "Pain will decrease to 1/10 with all functional activities   Time 8   Period Weeks   Status New   PT LONG TERM GOAL #3   Title "R shoulder AROM scaption will improve to 0-150 degrees for improved overhead reaching.    Time 8   Period Weeks   Status New   PT LONG TERM GOAL #4   Title "R shoulder AROM will return to [facility] to return to pain-free ADLs such as dressing and grooming   Time 8   Period Weeks   Status New   PT LONG TERM GOAL #5   Title "FOTO will improve from  55%  to  37%   indicating improved functional mobility .                Plan - 2015-03-18 1138    Clinical Impression Statement Pt accompanied by goddaughter as caregiver underwent on 2-216 RIGHT SHOULDER ARTHROSCOPIC DEBRIDEMNT OF G-H JOINT AND REMOVAL OF LOOSE BODIES,ARTHROSCOPIC  SUBACROMIAL [place] OPEN RCT REPAIR WITH SUPPLEMENTAL [place] by Dr. Joni Fears.  PA was called and pt will continue PROM for 3 weeks until  MD progresses after next MD visit.  Pt  with 1/10 pain at rest and 5/10 pain at night.  Pt currently sleeping on couch in upright postiion for comfort.   Pt  PROM is limited to 110 in Abd and Flexion.  Pt caregiver demonstrated good technique and will perform PROM 3 times a day for comfort.   Pt will benefit from skilled PT  to maximize function of R shoulder.   Pt will benefit from skilled therapeutic intervention in order to improve on the following deficits Decreased activity tolerance;Decreased scar mobility;Decreased range of motion;Increased edema;Increased fascial restricitons;Obesity;Pain;Postural dysfunction;Improper body mechanics   Rehab Potential Good   PT Frequency 2x / week  1-2 x a week for first weeks   PT Duration 8 weeks   PT Treatment/Interventions ADLs/Self Care Home Management;Cryotherapy;Electrical Stimulation;Ultrasound;Moist Heat;Functional mobility training;Neuromuscular re-education;Therapeutic exercise;Therapeutic activities;Patient/family education;Manual techniques;Dry needling;Passive range of motion;Scar mobilization  iontophoresis   PT Next Visit Plan  Pt continue PROM only until MD clears for Active assist /Active ROM/ strengthening.   Dr. Durward Fortes  PA wishes to go very slowly since pt is using Dermaspan patch and does not want to overstretch during initial healing.  PROM for comfort initially and will progress as Dr Durward Fortes  advises.   Consulted and Agree with Plan of Care Patient;Family member/caregiver          G-Codes - 18-Mar-2015 1136    Functional Assessment Tool Used FOTO   Functional Limitation Carrying, moving and handling objects  Carrying, Moving and Handling Objects Current Status [phone]) At least 40 percent but less than 60 percent impaired, limited or restricted   Carrying, Moving and  Handling Objects Goal Status [id number]) At least 20 percent but less than 40 percent impaired, limited or restricted       Problem List Patient Active Problem List   Diagnosis Date Noted  . Complete tear of right rotator cuff 01/21/2015  . Severe obesity (BMI >= 40) 01/21/2015  . Nausea alone 04/04/2013  . H/O adenomatous polyp of colon 04/04/2013  . FATTY LIVER DISEASE 11/11/2010  . RUQ PAIN 11/11/2010  . NONSPEC ELEVATION OF LEVELS OF TRANSAMINASE/LDH 11/11/2010  . COLONIC POLYPS, HYPERPLASTIC, HX OF 06/25/2010  . HYPOTHYROIDISM 06/24/2010  . ANEMIA-NOS 06/24/2010   Voncille Lo, PT 02/20/2015 2:43 PM Phone: [phone] Fax: [phone]  By signing I understand that I am ordering/authorizing the use of Iontophoresis using 4 mg/mL of dexamethasone as a component of this plan of care.  [place], [place], [address] Phone: [phone]   Fax:  [phone]

## 2015-02-20 NOTE — Patient Instructions (Addendum)
Cryotherapy Cryotherapy means treatment with cold. Ice or gel packs can be used to reduce both pain and swelling. Ice is the most helpful within the first 24 to 48 hours after an injury or flare-up from overusing a muscle or joint. Sprains, strains, spasms, burning pain, shooting pain, and aches can all be eased with ice. Ice can also be used when recovering from surgery. Ice is effective, has very few side effects, and is safe for most people to use. PRECAUTIONS  Ice is not a safe treatment option for people with:  Raynaud phenomenon. This is a condition affecting small blood vessels in the extremities. Exposure to cold may cause your problems to return.  Cold hypersensitivity. There are many forms of cold hypersensitivity, including:  Cold urticaria. Red, itchy hives appear on the skin when the tissues begin to warm after being iced.  Cold erythema. This is a red, itchy rash caused by exposure to cold.  Cold hemoglobinuria. Red blood cells break down when the tissues begin to warm after being iced. The hemoglobin that carry oxygen are passed into the urine because they cannot combine with blood proteins fast enough.  Numbness or altered sensitivity in the area being iced. If you have any of the following conditions, do not use ice until you have discussed cryotherapy with your caregiver:  Heart conditions, such as arrhythmia, angina, or chronic heart disease.  High blood pressure.  Healing wounds or open skin in the area being iced.  Current infections.  Rheumatoid arthritis.  Poor circulation.  Diabetes. Ice slows the blood flow in the region it is applied. This is beneficial when trying to stop inflamed tissues from spreading irritating chemicals to surrounding tissues. However, if you expose your skin to cold temperatures for too long or without the proper protection, you can damage your skin or nerves. Watch for signs of skin damage due to cold. HOME CARE INSTRUCTIONS Follow  these tips to use ice and cold packs safely.  Place a dry or damp towel between the ice and skin. A damp towel will cool the skin more quickly, so you may need to shorten the time that the ice is used.  For a more rapid response, add gentle compression to the ice.  Ice for no more than 10 to 20 minutes at a time. The bonier the area you are icing, the less time it will take to get the benefits of ice.  Check your skin after 5 minutes to make sure there are no signs of a poor response to cold or skin damage.  Rest 20 minutes or more between uses.  Once your skin is numb, you can end your treatment. You can test numbness by very lightly touching your skin. The touch should be so light that you do not see the skin dimple from the pressure of your fingertip. When using ice, most people will feel these normal sensations in this order: cold, burning, aching, and numbness.  Do not use ice on someone who cannot communicate their responses to pain, such as small children or people with dementia. HOW TO MAKE AN ICE PACK Ice packs are the most common way to use ice therapy. Other methods include ice massage, ice baths, and cryosprays. Muscle creams that cause a cold, tingly feeling do not offer the same benefits that ice offers and should not be used as a substitute unless recommended by your caregiver. To make an ice pack, do one of the following:  Place crushed ice or a  bag of frozen vegetables in a sealable plastic bag. Squeeze out the excess air. Place this bag inside another plastic bag. Slide the bag into a pillowcase or place a damp towel between your skin and the bag.  Mix 3 parts water with 1 part rubbing alcohol. Freeze the mixture in a sealable plastic bag. When you remove the mixture from the freezer, it will be slushy. Squeeze out the excess air. Place this bag inside another plastic bag. Slide the bag into a pillowcase or place a damp towel between your skin and the bag. SEEK MEDICAL CARE  IF:  You develop white spots on your skin. This may give the skin a blotchy (mottled) appearance.  Your skin turns blue or pale.  Your skin becomes waxy or hard.  Your swelling gets worse. MAKE SURE YOU:   Understand these instructions.  Will watch your condition.  Will get help right away if you are not doing well or get worse. Document Released: 08/02/2011 Document Revised: 04/22/2014 Document Reviewed: 08/02/2011 [facility] Patient Information 2015 [place], [place]. This information is not intended to replace advice given to you by your health care provider. Make sure you discuss any questions you have with your health care provider   Pt given  Drawn handout for initial Passive ROM of Right shoulder flex, abduction and External rotation with elbow bend and elbow straight.  Kim, caregiver demonstrated proper ability to perform PROM for patient.  Pt will be sent HEP 2 go exercise at home later today to reinforce proper technique  Please ask Dr. Kem Kays at next visit for another prescription for Active Assist Exercise or AROM exercise and send  protocol so we can progress you as he would like for you to progress.   You are now 4 weeks post op and doing PROM exercise.  Thanks   Voncille Lo, PT 02/20/2015 11:12 AM Phone: [phone] Fax: [phone] Flexion: ROM (Supine)   Position (A) Helper: Hold left arm close to side of trunk. Motion (B) - Lift arm over head in line with trunk, palm turned inward. CAUTION: Do not push into shoulder joint. Do not force movement if painful. Repeat __10_ times. Repeat with other arm. Do __3_ sessions per day.  Patient is passive. Do not force joint take to comfortable pain free Range of motion.   Copyright  VHI. All rights reserved.  Abduction: ROM (Supine)   Position (A) Helper: Hold left arm at elbow and wrist. Elbow may be bent or straight. Motion (B) -Glide arm out to side. -Do not allow arm to go beyond shoulder level. CAUTION: Do  not push into shoulder joint. Repeat 10___ times. Repeat with other arm. Do _3__ sessions per day.  Patient is passive.  Pain free motion only.    Copyright  VHI. All rights reserved.  External Rotation: ROM (Supine / Side-Lying)   Position (A) Patient: Rest left arm along side of trunk, elbow bent to 90. Helper: Stabilize at elbow, support arm at wrist. Motion (B) - Move hand up and away from stomach. -Elbow remains at side, wrist straight. CAUTION: Do not force movement. Repeat _10__ times.  Do __3_ sessions per day. Do only pain free motion.  Remember to have thumb pointed up and out.   When lying on your back, have pt arm straight by her side. At elbow rotate outward 10 times.  This should feel good.  As shown in clinic.   Pt is already doing Pendulum exercises given by MD after surgery.  Copyright  VHI. All rights reserved.

## 2015-02-21 ENCOUNTER — Ambulatory Visit: Payer: Medicare Other

## 2015-02-21 DIAGNOSIS — M25611 Stiffness of right shoulder, not elsewhere classified: Secondary | ICD-10-CM

## 2015-02-21 DIAGNOSIS — M25511 Pain in right shoulder: Secondary | ICD-10-CM | POA: Diagnosis not present

## 2015-02-21 DIAGNOSIS — M6281 Muscle weakness (generalized): Secondary | ICD-10-CM | POA: Diagnosis not present

## 2015-02-21 DIAGNOSIS — Z9889 Other specified postprocedural states: Secondary | ICD-10-CM | POA: Diagnosis not present

## 2015-02-21 MED ORDER — PANTOPRAZOLE SODIUM 40 MG PO TBEC
40.0000 mg | DELAYED_RELEASE_TABLET | Freq: Every day | ORAL | Status: DC
Start: 2015-02-21 — End: 2015-07-09

## 2015-02-21 NOTE — Therapy (Signed)
[place], [place], [address] Phone: [phone]   Fax:  [phone]  Physical Therapy Treatment  Patient Details  Name: Cheryl [name] MRN: [id number] Date of Birth: August 01, 1946 Referring Provider:  Sharilyn Sites, MD  Encounter Date: 02/21/2015      PT End of Session - 02/21/15 1045    Visit Number 2   Number of Visits 16   Date for PT Re-Evaluation 04/17/15   PT Start Time 1017   PT Stop Time 1045   PT Time Calculation (min) 28 min   Activity Tolerance Patient tolerated treatment well   Behavior During Therapy [facility] for tasks assessed/performed      Past Medical History  Diagnosis Date  . Acid reflux   . Sleep apnea     had surgery to correct  . Sinus drainage   . Arthritis     Past Surgical History  Procedure Laterality Date  . Knee surgery      x2  . Rotator cuff repair      x2  . Trigger finger release    . Carpal tunnel release    . Colonoscopy  02/15/2005    [id number] small polyps ablated via cold biopsy, one from transverse colon and two from the rectum/Small external hemorrhoids  . Colonoscopy  07/10/2010    [id number] rectum/long redundant colon, polyps in the sigmoid, descending, hepatic flexure/ADENOMATOUS POLYPS. next TCS due 06/2015  . Esophagogastroduodenoscopy  1995    Gastritis  . Colonoscopy  1995    3 polyps, path revealed chronic colitis  . Esophagogastroduodenoscopy N/A 04/16/2013    Procedure: ESOPHAGOGASTRODUODENOSCOPY (EGD);  Surgeon: Daneil Dolin, MD;  [facility];  Service: Endoscopy;  Laterality: N/A;  3:30  . Uvulopalatopharyngoplasty    . Shoulder arthroscopy with rotator cuff repair and subacromial decompression Right 01/21/2015    Procedure: RIGHT SHOULDER ARTHROSCOPIC DEBRIDEMNT OF G-H JOINT AND REMOVAL OF LOOSE BODIES,ARTHROSCOPIC SUBACROMIAL DECOMPRESSION,MINI OPEN RCT REPAIR WITH SUPPLEMENTAL [facility] PATCH;  Surgeon: Garald Balding, MD;   Location: [place];  Service: Orthopedics;  Laterality: Right;    There were no vitals taken for this visit.  Visit Diagnosis:  Decreased ROM of right shoulder      Subjective Assessment - 02/21/15 1018    Symptoms Continue with discomfort.    Currently in Pain? Yes   Pain Score 5    Multiple Pain Sites No          [facility] PT Assessment - 02/21/15 1031    PROM   Right Shoulder Flexion 150 Degrees  supine   Right Shoulder ABduction 150 Degrees  supine   Right Shoulder Internal Rotation 60 Degrees  supine   Right Shoulder External Rotation 63 Degrees  supine                  [facility] Adult PT Treatment/Exercise - 02/21/15 1030    Manual Therapy   Manual Therapy Passive ROM   Passive ROM flexion/abduction/ER with elbow extended and flexed.                  PT Education - 02/20/15 1125    Education provided Yes   Education Details Pt and caregiver instructed in how to perform PROM in Right shoulder flex, abduction and ER with elbow bend and straight.  Kim( caregiver) able to return demo.  Pt also instructed in positioning for comfort for sleeping and sitting up.  Pt using ice at home but given  additional information for Cryotherapy to reinforce from surgery instructions   Person(s) Educated Patient;Caregiver(s)   Methods Explanation;Demonstration;Tactile cues;Verbal cues;Handout   Comprehension Verbalized understanding;Returned demonstration;Need further instruction;Verbal cues required          PT Short Term Goals - 02-25-2015 1356    PT SHORT TERM GOAL #1   Title "Independent with initial HEP   Time 4   Period Weeks   Status New   PT SHORT TERM GOAL #2   Title "Report pain decrease from  5  /10 to   3 /10.   Time 4   Period Weeks   Status New   PT SHORT TERM GOAL #3   Title "Demonstrate and verbalize understanding of condition management including RICE, positioning, HEP.    Time 4   Period Weeks   Status New   PT SHORT TERM GOAL #4   Title Pt  caregiver will be able to correctly execute PROM for Right shoulder for pt    Time 2   Period Weeks   Status New           PT Long Term Goals - February 25, 2015 1359    PT LONG TERM GOAL #1   Title "Pt will be independent with advanced HEP.    Time 8   Period Weeks   Status New   PT LONG TERM GOAL #2   Title "Pain will decrease to 1/10 with all functional activities   Time 8   Period Weeks   Status New   PT LONG TERM GOAL #3   Title "R shoulder AROM scaption will improve to 0-150 degrees for improved overhead reaching.    Time 8   Period Weeks   Status New   PT LONG TERM GOAL #4   Title "R shoulder AROM will return to [facility] to return to pain-free ADLs such as dressing and grooming   Time 8   Period Weeks   Status New   PT LONG TERM GOAL #5   Title "FOTO will improve from  55%  to  37%   indicating improved functional mobility .                Plan - 02/21/15 1046    Clinical Impression Statement Continue with PROM only until cleared by MD to advance. Range increasecd today   PT Next Visit Plan PROM   Consulted and Agree with Plan of Care Patient          G-Codes - 02-25-15 1136    Functional Assessment Tool Used FOTO   Functional Limitation Carrying, moving and handling objects   Carrying, Moving and Handling Objects Current Status [id number]) At least 40 percent but less than 60 percent impaired, limited or restricted   Carrying, Moving and Handling Objects Goal Status [id number]) At least 20 percent but less than 40 percent impaired, limited or restricted      Problem List Patient Active Problem List   Diagnosis Date Noted  . Complete tear of right rotator cuff 01/21/2015  . Severe obesity (BMI >= 40) 01/21/2015  . Nausea alone 04/04/2013  . H/O adenomatous polyp of colon 04/04/2013  . FATTY LIVER DISEASE 11/11/2010  . RUQ PAIN 11/11/2010  . NONSPEC ELEVATION OF LEVELS OF TRANSAMINASE/LDH 11/11/2010  . COLONIC POLYPS, HYPERPLASTIC, HX OF 06/25/2010  .  HYPOTHYROIDISM 06/24/2010  . ANEMIA-NOS 06/24/2010    Darrel Hoover PT 02/21/2015, 10:48 AM  [facility] [address] [place], [place], [address] Phone:  [phone]   Fax:  [phone]

## 2015-02-25 ENCOUNTER — Ambulatory Visit: Payer: Medicare Other | Admitting: Physical Therapy

## 2015-02-25 DIAGNOSIS — M6281 Muscle weakness (generalized): Secondary | ICD-10-CM | POA: Diagnosis not present

## 2015-02-25 DIAGNOSIS — M25611 Stiffness of right shoulder, not elsewhere classified: Secondary | ICD-10-CM

## 2015-02-25 DIAGNOSIS — M25511 Pain in right shoulder: Secondary | ICD-10-CM | POA: Diagnosis not present

## 2015-02-25 DIAGNOSIS — Z9889 Other specified postprocedural states: Secondary | ICD-10-CM | POA: Diagnosis not present

## 2015-02-25 NOTE — Therapy (Signed)
[place], [place], [address] Phone: [phone]   Fax:  [phone]  Physical Therapy Treatment  Patient Details  Name: Cheryl [name] MRN: [id number] Date of Birth: 01-11-1946 Referring Provider:  Sharilyn Sites, MD  Encounter Date: 02/25/2015      PT End of Session - 02/25/15 1210    Visit Number 3   Number of Visits 16   Date for PT Re-Evaluation 04/17/15   PT Start Time 0935   PT Stop Time 1025   PT Time Calculation (min) 50 min   Activity Tolerance Patient tolerated treatment well;Patient limited by pain      Past Medical History  Diagnosis Date  . Acid reflux   . Sleep apnea     had surgery to correct  . Sinus drainage   . Arthritis     Past Surgical History  Procedure Laterality Date  . Knee surgery      x2  . Rotator cuff repair      x2  . Trigger finger release    . Carpal tunnel release    . Colonoscopy  02/15/2005    [id number] small polyps ablated via cold biopsy, one from transverse colon and two from the rectum/Small external hemorrhoids  . Colonoscopy  07/10/2010    [id number] rectum/long redundant colon, polyps in the sigmoid, descending, hepatic flexure/ADENOMATOUS POLYPS. next TCS due 06/2015  . Esophagogastroduodenoscopy  1995    Gastritis  . Colonoscopy  1995    3 polyps, path revealed chronic colitis  . Esophagogastroduodenoscopy N/A 04/16/2013    Procedure: ESOPHAGOGASTRODUODENOSCOPY (EGD);  Surgeon: Daneil Dolin, MD;  [facility];  Service: Endoscopy;  Laterality: N/A;  3:30  . Uvulopalatopharyngoplasty    . Shoulder arthroscopy with rotator cuff repair and subacromial decompression Right 01/21/2015    Procedure: RIGHT SHOULDER ARTHROSCOPIC DEBRIDEMNT OF G-H JOINT AND REMOVAL OF LOOSE BODIES,ARTHROSCOPIC SUBACROMIAL DECOMPRESSION,MINI OPEN RCT REPAIR WITH SUPPLEMENTAL [facility] PATCH;  Surgeon: Garald Balding, MD;  Location: [place];  Service: Orthopedics;   Laterality: Right;    There were no vitals taken for this visit.  Visit Diagnosis:  Decreased ROM of right shoulder  Pain in joint, shoulder region, right      Subjective Assessment - 02/25/15 0936    Symptoms Wearing sling, Sleeping OK.  Cannot do exercises regularyl , no help at home.  No pain feels stiff.     Pain Score 7    Pain Location Shoulder   Pain Orientation Right   Pain Descriptors / Indicators Aching  stiff   Aggravating Factors  coldness,   Pain Relieving Factors ice   Multiple Pain Sites No                    [facility] Adult PT Treatment/Exercise - 02/25/15 1002    Cryotherapy   Number Minutes Cryotherapy 10 Minutes   Cryotherapy Location Shoulder   Type of Cryotherapy --  cold pack   Manual Therapy   Manual Therapy --  65 ER,, 132 abduction, flexion 150                  PT Short Term Goals - 02/25/15 1213    PT SHORT TERM GOAL #1   Title "Independent with initial HEP   Baseline family inconsistant with being able to help   Time 4   Period Weeks   Status On-going   PT SHORT TERM GOAL #2   Title "Report pain  decrease from  5  /10 to   3 /10.   Time 4   Period Weeks   Status On-going   PT SHORT TERM GOAL #3   Title "Demonstrate and verbalize understanding of condition management including RICE, positioning, HEP.    Time 4   Period Weeks   Status On-going   PT SHORT TERM GOAL #4   Title Pt caregiver will be able to correctly execute PROM for Right shoulder for pt    Baseline not here today   Time 2   Period Weeks   Status On-going           PT Long Term Goals - 02/20/15 1359    PT LONG TERM GOAL #1   Title "Pt will be independent with advanced HEP.    Time 8   Period Weeks   Status New   PT LONG TERM GOAL #2   Title "Pain will decrease to 1/10 with all functional activities   Time 8   Period Weeks   Status New   PT LONG TERM GOAL #3   Title "R shoulder AROM scaption will improve to 0-150 degrees for improved  overhead reaching.    Time 8   Period Weeks   Status New   PT LONG TERM GOAL #4   Title "R shoulder AROM will return to [facility] to return to pain-free ADLs such as dressing and grooming   Time 8   Period Weeks   Status New   PT LONG TERM GOAL #5   Title "FOTO will improve from  55%  to  37%   indicating improved functional mobility .                Plan - 02/25/15 1211    Clinical Impression Statement Soft tissue work needed to relax patient enough for PROM.  Pain 5-6/10 after manual today, prior to cold pack.   PT Next Visit Plan PROM        Problem List Patient Active Problem List   Diagnosis Date Noted  . Complete tear of right rotator cuff 01/21/2015  . Severe obesity (BMI >= 40) 01/21/2015  . Nausea alone 04/04/2013  . H/O adenomatous polyp of colon 04/04/2013  . FATTY LIVER DISEASE 11/11/2010  . RUQ PAIN 11/11/2010  . NONSPEC ELEVATION OF LEVELS OF TRANSAMINASE/LDH 11/11/2010  . COLONIC POLYPS, HYPERPLASTIC, HX OF 06/25/2010  . HYPOTHYROIDISM 06/24/2010  . ANEMIA-NOS 06/24/2010   Melvenia Needles, PTA 02/25/2015 12:18 PM Phone: [phone] Fax: [phone]   [facility] 02/25/2015, 12:18 PM  [facility] [address] [place], [place], [address] Phone: [phone]   Fax:  [phone]

## 2015-03-04 ENCOUNTER — Ambulatory Visit: Payer: Medicare Other | Admitting: Physical Therapy

## 2015-03-04 DIAGNOSIS — M25611 Stiffness of right shoulder, not elsewhere classified: Secondary | ICD-10-CM

## 2015-03-04 DIAGNOSIS — M25511 Pain in right shoulder: Secondary | ICD-10-CM | POA: Diagnosis not present

## 2015-03-04 DIAGNOSIS — Z9889 Other specified postprocedural states: Secondary | ICD-10-CM | POA: Diagnosis not present

## 2015-03-04 DIAGNOSIS — M6281 Muscle weakness (generalized): Secondary | ICD-10-CM | POA: Diagnosis not present

## 2015-03-04 NOTE — Therapy (Signed)
[place], [place], [address] Phone: [phone]   Fax:  [phone]  Physical Therapy Treatment  Patient Details  Name: Cheryl [name] MRN: [id number] Date of Birth: August 22, 1946 Referring Provider:  Sharilyn Sites, MD  Encounter Date: 03/04/2015      PT End of Session - 03/04/15 1245    Visit Number 4   Number of Visits 16   Date for PT Re-Evaluation 04/17/15   PT Start Time [id number]   PT Stop Time 1130   PT Time Calculation (min) 45 min   Activity Tolerance Patient tolerated treatment well      Past Medical History  Diagnosis Date  . Acid reflux   . Sleep apnea     had surgery to correct  . Sinus drainage   . Arthritis     Past Surgical History  Procedure Laterality Date  . Knee surgery      x2  . Rotator cuff repair      x2  . Trigger finger release    . Carpal tunnel release    . Colonoscopy  02/15/2005    [id number] small polyps ablated via cold biopsy, one from transverse colon and two from the rectum/Small external hemorrhoids  . Colonoscopy  07/10/2010    [id number] rectum/long redundant colon, polyps in the sigmoid, descending, hepatic flexure/ADENOMATOUS POLYPS. next TCS due 06/2015  . Esophagogastroduodenoscopy  1995    Gastritis  . Colonoscopy  1995    3 polyps, path revealed chronic colitis  . Esophagogastroduodenoscopy N/A 04/16/2013    Procedure: ESOPHAGOGASTRODUODENOSCOPY (EGD);  Surgeon: Daneil Dolin, MD;  [facility];  Service: Endoscopy;  Laterality: N/A;  3:30  . Uvulopalatopharyngoplasty    . Shoulder arthroscopy with rotator cuff repair and subacromial decompression Right 01/21/2015    Procedure: RIGHT SHOULDER ARTHROSCOPIC DEBRIDEMNT OF G-H JOINT AND REMOVAL OF LOOSE BODIES,ARTHROSCOPIC SUBACROMIAL DECOMPRESSION,MINI OPEN RCT REPAIR WITH SUPPLEMENTAL [facility] PATCH;  Surgeon: Garald Balding, MD;  Location: [place];  Service: Orthopedics;  Laterality: Right;     There were no vitals filed for this visit.  Visit Diagnosis:  Decreased ROM of right shoulder  Pain in joint, shoulder region, right      Subjective Assessment - 03/04/15 1052    Symptoms Doing exercises, last pain Sunday.  got cold and has a stabbing pain.  Better with getting  warm.  Still has to do do home exercises by herself.                       [facility] Adult PT Treatment/Exercise - 03/04/15 1110    Cryotherapy   Number Minutes Cryotherapy 10 Minutes   Cryotherapy Location Shoulder   Type of Cryotherapy --  cold pack   Manual Therapy   Manual Therapy --  PROM Flexion, abduction, ER  Also soft tissue work prior to    Passive ROM 150 flex, 120 abd, er 50                  PT Short Term Goals - 03/04/15 1253    PT SHORT TERM GOAL #1   Title "Independent with initial HEP   Baseline family inconsistant with being able to help   Time 4   Status On-going   PT SHORT TERM GOAL #2   Title "Report pain decrease from  5  /10 to   3 /10.   Time 4   Period Weeks   Status On-going  PT SHORT TERM GOAL #3   Title "Demonstrate and verbalize understanding of condition management including RICE, positioning, HEP.    Time 4   Period Weeks   Status Achieved   PT SHORT TERM GOAL #4   Title Pt caregiver will be able to correctly execute PROM for Right shoulder for pt    Baseline not here today   Time 2   Period Weeks   Status On-going           PT Long Term Goals - 02/20/15 1359    PT LONG TERM GOAL #1   Title "Pt will be independent with advanced HEP.    Time 8   Period Weeks   Status New   PT LONG TERM GOAL #2   Title "Pain will decrease to 1/10 with all functional activities   Time 8   Period Weeks   Status New   PT LONG TERM GOAL #3   Title "R shoulder AROM scaption will improve to 0-150 degrees for improved overhead reaching.    Time 8   Period Weeks   Status New   PT LONG TERM GOAL #4   Title "R shoulder AROM will return to [facility]  to return to pain-free ADLs such as dressing and grooming   Time 8   Period Weeks   Status New   PT LONG TERM GOAL #5   Title "FOTO will improve from  55%  to  37%   indicating improved functional mobility .                Plan - 03/04/15 1249    Clinical Impression Statement Range similar to last visit   PT Next Visit Plan protocol, PROM   Consulted and Agree with Plan of Care Patient        Problem List Patient Active Problem List   Diagnosis Date Noted  . Complete tear of right rotator cuff 01/21/2015  . Severe obesity (BMI >= 40) 01/21/2015  . Nausea alone 04/04/2013  . H/O adenomatous polyp of colon 04/04/2013  . FATTY LIVER DISEASE 11/11/2010  . RUQ PAIN 11/11/2010  . NONSPEC ELEVATION OF LEVELS OF TRANSAMINASE/LDH 11/11/2010  . COLONIC POLYPS, HYPERPLASTIC, HX OF 06/25/2010  . HYPOTHYROIDISM 06/24/2010  . ANEMIA-NOS 06/24/2010   Melvenia Needles, PTA 03/04/2015 12:55 PM Phone: [phone] Fax: [phone]  [facility] 03/04/2015, 12:55 PM  [facility] [address] [place], [place], [address] Phone: [phone]   Fax:  [phone]

## 2015-03-06 ENCOUNTER — Ambulatory Visit: Payer: Medicare Other | Admitting: Physical Therapy

## 2015-03-06 DIAGNOSIS — M6281 Muscle weakness (generalized): Secondary | ICD-10-CM | POA: Diagnosis not present

## 2015-03-06 DIAGNOSIS — M25611 Stiffness of right shoulder, not elsewhere classified: Secondary | ICD-10-CM

## 2015-03-06 DIAGNOSIS — M25511 Pain in right shoulder: Secondary | ICD-10-CM

## 2015-03-06 DIAGNOSIS — Z9889 Other specified postprocedural states: Secondary | ICD-10-CM | POA: Diagnosis not present

## 2015-03-06 NOTE — Therapy (Signed)
[place], [place], [address] Phone: [phone]   Fax:  [phone]  Physical Therapy Treatment  Patient Details  Name: Cheryl [name] MRN: [id number] Date of Birth: 01/19/1946 Referring Provider:  Sharilyn Sites, MD  Encounter Date: 03/06/2015      PT End of Session - 03/06/15 1243    Visit Number 5   Number of Visits 16   Date for PT Re-Evaluation 04/17/15   PT Start Time 1140   PT Stop Time 1244   PT Time Calculation (min) 64 min   Activity Tolerance Patient tolerated treatment well      Past Medical History  Diagnosis Date  . Acid reflux   . Sleep apnea     had surgery to correct  . Sinus drainage   . Arthritis     Past Surgical History  Procedure Laterality Date  . Knee surgery      x2  . Rotator cuff repair      x2  . Trigger finger release    . Carpal tunnel release    . Colonoscopy  02/15/2005    [id number] small polyps ablated via cold biopsy, one from transverse colon and two from the rectum/Small external hemorrhoids  . Colonoscopy  07/10/2010    [id number] rectum/long redundant colon, polyps in the sigmoid, descending, hepatic flexure/ADENOMATOUS POLYPS. next TCS due 06/2015  . Esophagogastroduodenoscopy  1995    Gastritis  . Colonoscopy  1995    3 polyps, path revealed chronic colitis  . Esophagogastroduodenoscopy N/A 04/16/2013    Procedure: ESOPHAGOGASTRODUODENOSCOPY (EGD);  Surgeon: Daneil Dolin, MD;  [facility];  Service: Endoscopy;  Laterality: N/A;  3:30  . Uvulopalatopharyngoplasty    . Shoulder arthroscopy with rotator cuff repair and subacromial decompression Right 01/21/2015    Procedure: RIGHT SHOULDER ARTHROSCOPIC DEBRIDEMNT OF G-H JOINT AND REMOVAL OF LOOSE BODIES,ARTHROSCOPIC SUBACROMIAL DECOMPRESSION,MINI OPEN RCT REPAIR WITH SUPPLEMENTAL [facility] PATCH;  Surgeon: Garald Balding, MD;  Location: [place];  Service: Orthopedics;  Laterality: Right;     There were no vitals filed for this visit.  Visit Diagnosis:  Decreased ROM of right shoulder  Pain in joint, shoulder region, right      Subjective Assessment - 03/06/15 1241    Symptoms Family has not been able to help with Range.   Hard to not to use arm.                       [place] Adult PT Treatment/Exercise - 03/06/15 1150    Cryotherapy   Number Minutes Cryotherapy 10 Minutes   Cryotherapy Location Shoulder   Type of Cryotherapy --  cold pack   Manual Therapy   Manual Therapy --  Passive flexion, abduction, ER, soft tissue work    Passive ROM --  Rt Shoulder                  PT Short Term Goals - 03/06/15 1248    PT SHORT TERM GOAL #1   Title "Independent with initial HEP   Baseline patient doing pendulums   Time 4   Period Weeks   Status Achieved   PT SHORT TERM GOAL #2   Title "Report pain decrease from  5  /10 to   3 /10.   Time 4   Period Weeks   Status On-going   PT SHORT TERM GOAL #4   Title Pt caregiver will be able to correctly execute  PROM for Right shoulder for pt    Baseline Patient confedded person who drives her  does not work with her shoulder   Time 2   Period Weeks   Status On-going           PT Long Term Goals - 03/06/15 1251    PT LONG TERM GOAL #1   Title "Pt will be independent with advanced HEP.    Status On-going   PT LONG TERM GOAL #2   Title "Pain will decrease to 1/10 with all functional activities   Status Unable to assess   PT LONG TERM GOAL #3   Title "R shoulder AROM scaption will improve to 0-150 degrees for improved overhead reaching.    Baseline limited by protocol   Status Unable to assess   PT LONG TERM GOAL #4   Baseline limited by protocol.   Status Unable to assess               Plan - 03/06/15 1244    Clinical Impression Statement Doing ok despite no one to range her at home.  Precautions rewiewed.  Protocol limited due to protocol.   PT Next Visit Plan protocol,  PROM   Consulted and Agree with Plan of Care Patient        Problem List Patient Active Problem List   Diagnosis Date Noted  . Complete tear of right rotator cuff 01/21/2015  . Severe obesity (BMI >= 40) 01/21/2015  . Nausea alone 04/04/2013  . H/O adenomatous polyp of colon 04/04/2013  . FATTY LIVER DISEASE 11/11/2010  . RUQ PAIN 11/11/2010  . NONSPEC ELEVATION OF LEVELS OF TRANSAMINASE/LDH 11/11/2010  . COLONIC POLYPS, HYPERPLASTIC, HX OF 06/25/2010  . HYPOTHYROIDISM 06/24/2010  . ANEMIA-NOS 06/24/2010    HARRIS,KAREN 03/06/2015, 12:58 PM  [facility] [address] [place], [place], [address] Phone: [phone]   Fax:  [phone]

## 2015-03-06 NOTE — Patient Instructions (Signed)
Do not use her arm.

## 2015-03-11 ENCOUNTER — Ambulatory Visit: Payer: Medicare Other | Admitting: Physical Therapy

## 2015-03-11 DIAGNOSIS — M25511 Pain in right shoulder: Secondary | ICD-10-CM | POA: Diagnosis not present

## 2015-03-11 DIAGNOSIS — M25611 Stiffness of right shoulder, not elsewhere classified: Secondary | ICD-10-CM

## 2015-03-11 DIAGNOSIS — M6281 Muscle weakness (generalized): Secondary | ICD-10-CM | POA: Diagnosis not present

## 2015-03-11 DIAGNOSIS — Z9889 Other specified postprocedural states: Secondary | ICD-10-CM | POA: Diagnosis not present

## 2015-03-11 NOTE — Patient Instructions (Signed)
Precautions again reviewed.

## 2015-03-11 NOTE — Therapy (Signed)
[place], [place], [address] Phone: [phone]   Fax:  [phone]  Physical Therapy Treatment  Patient Details  Name: Cheryl [name] MRN: [id number] Date of Birth: 22-Apr-1946 Referring Provider:  Sharilyn Sites, MD  Encounter Date: 03/11/2015      PT End of Session - 03/11/15 1305    Visit Number 6   Number of Visits 16   Date for PT Re-Evaluation 04/17/15   PT Start Time 1100   PT Stop Time 1155   PT Time Calculation (min) 55 min   Activity Tolerance Patient tolerated treatment well      Past Medical History  Diagnosis Date  . Acid reflux   . Sleep apnea     had surgery to correct  . Sinus drainage   . Arthritis     Past Surgical History  Procedure Laterality Date  . Knee surgery      x2  . Rotator cuff repair      x2  . Trigger finger release    . Carpal tunnel release    . Colonoscopy  02/15/2005    [id number] small polyps ablated via cold biopsy, one from transverse colon and two from the rectum/Small external hemorrhoids  . Colonoscopy  07/10/2010    [id number] rectum/long redundant colon, polyps in the sigmoid, descending, hepatic flexure/ADENOMATOUS POLYPS. next TCS due 06/2015  . Esophagogastroduodenoscopy  1995    Gastritis  . Colonoscopy  1995    3 polyps, path revealed chronic colitis  . Esophagogastroduodenoscopy N/A 04/16/2013    Procedure: ESOPHAGOGASTRODUODENOSCOPY (EGD);  Surgeon: Daneil Dolin, MD;  [facility];  Service: Endoscopy;  Laterality: N/A;  3:30  . Uvulopalatopharyngoplasty    . Shoulder arthroscopy with rotator cuff repair and subacromial decompression Right 01/21/2015    Procedure: RIGHT SHOULDER ARTHROSCOPIC DEBRIDEMNT OF G-H JOINT AND REMOVAL OF LOOSE BODIES,ARTHROSCOPIC SUBACROMIAL DECOMPRESSION,MINI OPEN RCT REPAIR WITH SUPPLEMENTAL [facility] PATCH;  Surgeon: Garald Balding, MD;  Location: [place];  Service: Orthopedics;  Laterality: Right;     There were no vitals filed for this visit.  Visit Diagnosis:  Decreased ROM of right shoulder      Subjective Assessment - 03/11/15 1106    Symptoms Saw MD, back in i month.  Good motion.  Not yet driving, no pain, No sling, no lifting.   Currently in Pain? No/denies                       [facility] Adult PT Treatment/Exercise - 03/11/15 1125    Shoulder Exercises: Supine   External Rotation --  ER 10 reps passive   Other Supine Exercises Isometric ER, extension, flexion, abduction,10 reps, 5 second holds   Shoulder Exercises: Seated   External Rotation Limitations Cane ER 10 reps, cued for technique   Other Seated Exercises isometric extension 5 reps 5 second holds for posture correction   Cryotherapy   Number Minutes Cryotherapy 10 Minutes   Cryotherapy Location Shoulder   Type of Cryotherapy --  cold pack   Manual Therapy   Passive ROM Flexion  Also ER, abduction, multiple reps                  PT Short Term Goals - 03/11/15 1307    PT SHORT TERM GOAL #1   Title "Independent with initial HEP   Status Achieved   PT SHORT TERM GOAL #2   Title "Report pain decrease from  5  /  10 to   3 /10.   Time 4   Status Achieved   PT SHORT TERM GOAL #3   Title "Demonstrate and verbalize understanding of condition management including RICE, positioning, HEP.    Time 4   Period Weeks   Status Achieved   PT SHORT TERM GOAL #4   Title Pt caregiver will be able to correctly execute PROM for Right shoulder for pt    Period Weeks   Status On-going           PT Long Term Goals - 03/11/15 1307    PT LONG TERM GOAL #1   Title "Pt will be independent with advanced HEP.    Status On-going   PT LONG TERM GOAL #2   Status On-going   PT LONG TERM GOAL #3   Title "R shoulder AROM scaption will improve to 0-150 degrees for improved overhead reaching.    Status On-going   PT LONG TERM GOAL #4   Title "R shoulder AROM will return to [facility] to return to pain-free  ADLs such as dressing and grooming   Status On-going               Plan - 03/11/15 1306    Clinical Impression Statement Able to wean from sling, Md happy with Range.  Protocol.   PT Next Visit Plan protocol, PROM        Problem List Patient Active Problem List   Diagnosis Date Noted  . Complete tear of right rotator cuff 01/21/2015  . Severe obesity (BMI >= 40) 01/21/2015  . Nausea alone 04/04/2013  . H/O adenomatous polyp of colon 04/04/2013  . FATTY LIVER DISEASE 11/11/2010  . RUQ PAIN 11/11/2010  . NONSPEC ELEVATION OF LEVELS OF TRANSAMINASE/LDH 11/11/2010  . COLONIC POLYPS, HYPERPLASTIC, HX OF 06/25/2010  . HYPOTHYROIDISM 06/24/2010  . ANEMIA-NOS 06/24/2010   Melvenia Needles, PTA 03/11/2015 1:24 PM Phone: [phone] Fax: [phone]  Melvenia Needles 03/11/2015, 1:24 PM  [facility] [address] [place], [place], [address] Phone: [phone]   Fax:  [phone]

## 2015-03-13 ENCOUNTER — Ambulatory Visit: Payer: Medicare Other | Admitting: Physical Therapy

## 2015-03-13 DIAGNOSIS — M6281 Muscle weakness (generalized): Secondary | ICD-10-CM | POA: Diagnosis not present

## 2015-03-13 DIAGNOSIS — Z9889 Other specified postprocedural states: Secondary | ICD-10-CM | POA: Diagnosis not present

## 2015-03-13 DIAGNOSIS — R531 Weakness: Secondary | ICD-10-CM

## 2015-03-13 DIAGNOSIS — M25611 Stiffness of right shoulder, not elsewhere classified: Secondary | ICD-10-CM

## 2015-03-13 DIAGNOSIS — M25511 Pain in right shoulder: Secondary | ICD-10-CM | POA: Diagnosis not present

## 2015-03-13 NOTE — Therapy (Signed)
[place], [place], [address] Phone: [phone]   Fax:  [phone]  Physical Therapy Treatment  Patient Details  Name: Cheryl [name] MRN: [id number] Date of Birth: [birth year] Referring Provider:  Sharilyn Sites, MD  Encounter Date: 03/13/2015      PT End of Session - 03/13/15 1200    Visit Number 7   Number of Visits 16   Date for PT Re-Evaluation 04/17/15   PT Start Time 1109   PT Stop Time 1208   PT Time Calculation (min) 59 min   Activity Tolerance Patient tolerated treatment well      Past Medical History  Diagnosis Date  . Acid reflux   . Sleep apnea     had surgery to correct  . Sinus drainage   . Arthritis     Past Surgical History  Procedure Laterality Date  . Knee surgery      x2  . Rotator cuff repair      x2  . Trigger finger release    . Carpal tunnel release    . Colonoscopy  02/15/2005    [id number] small polyps ablated via cold biopsy, one from transverse colon and two from the rectum/Small external hemorrhoids  . Colonoscopy  07/10/2010    [id number] rectum/long redundant colon, polyps in the sigmoid, descending, hepatic flexure/ADENOMATOUS POLYPS. next TCS due 06/2015  . Esophagogastroduodenoscopy  1995    Gastritis  . Colonoscopy  1995    3 polyps, path revealed chronic colitis  . Esophagogastroduodenoscopy N/A 04/16/2013    Procedure: ESOPHAGOGASTRODUODENOSCOPY (EGD);  Surgeon: Daneil Dolin, MD;  [facility];  Service: Endoscopy;  Laterality: N/A;  3:30  . Uvulopalatopharyngoplasty    . Shoulder arthroscopy with rotator cuff repair and subacromial decompression Right 01/21/2015    Procedure: RIGHT SHOULDER ARTHROSCOPIC DEBRIDEMNT OF G-H JOINT AND REMOVAL OF LOOSE BODIES,ARTHROSCOPIC SUBACROMIAL DECOMPRESSION,MINI OPEN RCT REPAIR WITH SUPPLEMENTAL [facility] PATCH;  Surgeon: Garald Balding, MD;  Location: [place];  Service: Orthopedics;  Laterality: Right;     There were no vitals filed for this visit.  Visit Diagnosis:  Decreased ROM of right shoulder  Decreased strength      Subjective Assessment - 03/13/15 1113    Symptoms No pain at rest .. It feels less stiff.  Cold weather makes it feel like it is tingling.     Pain Score 0-No pain   Pain Descriptors / Indicators Tingling   Aggravating Factors  coldness   Pain Relieving Factors ice   Multiple Pain Sites No                       [facility] Adult PT Treatment/Exercise - 03/13/15 1121    Shoulder Exercises: Supine   Other Supine Exercises serratus punch 10 reps   Wrist Exercises   Wrist Flexion Limitations --  2 Lbs !0 reps   Wrist Extension --  2 Lbs 10 reps   Wrist Radial Deviation --  2 LBS 10 reps   Cryotherapy   Number Minutes Cryotherapy 10 Minutes   Cryotherapy Location Shoulder   Type of Cryotherapy --  cold pack   Manual Therapy   Manual Therapy Passive ROM   Passive ROM --  Rt shoulder flexion, abduction, ER,  soft tissue workto shou    Grip Rt 35,31,30 LBS,  Lt 39, 34, 35 LBS              PT Short Term  Goals - 03/11/15 1307    PT SHORT TERM GOAL #1   Title "Independent with initial HEP   Status Achieved   PT SHORT TERM GOAL #2   Title "Report pain decrease from  5  /10 to   3 /10.   Time 4   Status Achieved   PT SHORT TERM GOAL #3   Title "Demonstrate and verbalize understanding of condition management including RICE, positioning, HEP.    Time 4   Period Weeks   Status Achieved   PT SHORT TERM GOAL #4   Title Pt caregiver will be able to correctly execute PROM for Right shoulder for pt    Period Weeks   Status On-going           PT Long Term Goals - 03/11/15 1307    PT LONG TERM GOAL #1   Title "Pt will be independent with advanced HEP.    Status On-going   PT LONG TERM GOAL #2   Status On-going   PT LONG TERM GOAL #3   Title "R shoulder AROM scaption will improve to 0-150 degrees for improved overhead reaching.     Status On-going   PT LONG TERM GOAL #4   Title "R shoulder AROM will return to [facility] to return to pain-free ADLs such as dressing and grooming   Status On-going               Plan - 03/13/15 1201    Clinical Impression Statement Able to progress protocol to pulleys and light weights for wrist, elbow  .  &  weeks post OP  may be ready for more exercises.   PT Next Visit Plan bands for IR/ER, rows        Problem List Patient Active Problem List   Diagnosis Date Noted  . Complete tear of right rotator cuff 01/21/2015  . Severe obesity (BMI >= 40) 01/21/2015  . Nausea alone 04/04/2013  . H/O adenomatous polyp of colon 04/04/2013  . FATTY LIVER DISEASE 11/11/2010  . RUQ PAIN 11/11/2010  . NONSPEC ELEVATION OF LEVELS OF TRANSAMINASE/LDH 11/11/2010  . COLONIC POLYPS, HYPERPLASTIC, HX OF 06/25/2010  . HYPOTHYROIDISM 06/24/2010  . ANEMIA-NOS 06/24/2010    HARRIS,KAREN 03/13/2015, 12:08 PM  [facility] [address] [place], [place], [address] Phone: [phone]   Fax:  [phone]

## 2015-03-18 ENCOUNTER — Ambulatory Visit: Payer: Medicare Other | Admitting: Physical Therapy

## 2015-03-18 DIAGNOSIS — M25511 Pain in right shoulder: Secondary | ICD-10-CM | POA: Diagnosis not present

## 2015-03-18 DIAGNOSIS — M25611 Stiffness of right shoulder, not elsewhere classified: Secondary | ICD-10-CM

## 2015-03-18 DIAGNOSIS — Z9889 Other specified postprocedural states: Secondary | ICD-10-CM | POA: Diagnosis not present

## 2015-03-18 DIAGNOSIS — M6281 Muscle weakness (generalized): Secondary | ICD-10-CM | POA: Diagnosis not present

## 2015-03-18 NOTE — Therapy (Signed)
[place], [place], [address] Phone: [phone]   Fax:  [phone]  Physical Therapy Treatment  Patient Details  Name: Cheryl [name] MRN: [id number] Date of Birth: 12/12/1946 Referring Provider:  Sharilyn Sites, MD  Encounter Date: 03/18/2015      PT End of Session - 03/18/15 1337    Visit Number 8   Number of Visits 16   Date for PT Re-Evaluation 04/17/15   PT Start Time 1101   PT Stop Time 1155   PT Time Calculation (min) 54 min   Activity Tolerance Patient tolerated treatment well      Past Medical History  Diagnosis Date  . Acid reflux   . Sleep apnea     had surgery to correct  . Sinus drainage   . Arthritis     Past Surgical History  Procedure Laterality Date  . Knee surgery      x2  . Rotator cuff repair      x2  . Trigger finger release    . Carpal tunnel release    . Colonoscopy  02/15/2005    [id number] small polyps ablated via cold biopsy, one from transverse colon and two from the rectum/Small external hemorrhoids  . Colonoscopy  07/10/2010    [id number] rectum/long redundant colon, polyps in the sigmoid, descending, hepatic flexure/ADENOMATOUS POLYPS. next TCS due 06/2015  . Esophagogastroduodenoscopy  1995    Gastritis  . Colonoscopy  1995    3 polyps, path revealed chronic colitis  . Esophagogastroduodenoscopy N/A 04/16/2013    Procedure: ESOPHAGOGASTRODUODENOSCOPY (EGD);  Surgeon: Daneil Dolin, MD;  [facility];  Service: Endoscopy;  Laterality: N/A;  3:30  . Uvulopalatopharyngoplasty    . Shoulder arthroscopy with rotator cuff repair and subacromial decompression Right 01/21/2015    Procedure: RIGHT SHOULDER ARTHROSCOPIC DEBRIDEMNT OF G-H JOINT AND REMOVAL OF LOOSE BODIES,ARTHROSCOPIC SUBACROMIAL DECOMPRESSION,MINI OPEN RCT REPAIR WITH SUPPLEMENTAL [facility] PATCH;  Surgeon: Garald Balding, MD;  Location: [place];  Service: Orthopedics;  Laterality: Right;     There were no vitals filed for this visit.  Visit Diagnosis:  Decreased ROM of right shoulder      Subjective Assessment - 03/18/15 1327    Symptoms no pain, found myself scratching my back with my arm, then I stopped.  Neck uncomfortable at night.   Currently in Pain? No/denies   Pain Score 0-No pain   Pain Location Neck   Pain Descriptors / Indicators Tightness  upper  trap Rt, intermittant at night   Aggravating Factors  being cold   Pain Relieving Factors resting, keeping warm   Multiple Pain Sites No                       [facility] Adult PT Treatment/Exercise - 03/18/15 1110    Cryotherapy   Number Minutes Cryotherapy 10 Minutes   Cryotherapy Location Shoulder   Type of Cryotherapy --  cold pack   Manual Therapy   Manual Therapy Passive ROM  flexion, abduction, ER   Passive ROM --  Rt shoulder, gentle soft tissue work upper trap, neck roll                   PT Short Term Goals - 03/18/15 1344    PT SHORT TERM GOAL #4   Title Pt caregiver will be able to correctly execute PROM for Right shoulder for pt    Time 2   Period Weeks  Status On-going           PT Long Term Goals - 03/18/15 1345    PT LONG TERM GOAL #1   Title "Pt will be independent with advanced HEP.    Time 8   Period Weeks   Status On-going   PT LONG TERM GOAL #2   Title "Pain will decrease to 1/10 with all functional activities   Baseline limited by protocol   Time 8   Period Weeks   Status On-going   PT LONG TERM GOAL #3   Title "R shoulder AROM scaption will improve to 0-150 degrees for improved overhead reaching.    Baseline limited by protocol   Time 8   Period Weeks   Status On-going   PT LONG TERM GOAL #4   Title "R shoulder AROM will return to [facility] to return to pain-free ADLs such as dressing and grooming   Baseline limited by protocol   Time 8   Period Weeks   Status On-going   PT LONG TERM GOAL #5   Title "FOTO will improve from  55%  to  37%    indicating improved functional mobility .    Time 8   Period Weeks   Status Unable to assess               Plan - 03/18/15 1340    Clinical Impression Statement Md notes from last appointment said to continue with PT, no new details.  Passive ROM continued   PT Next Visit Plan Passive until MD says different.   Consulted and Agree with Plan of Care Patient        Problem List Patient Active Problem List   Diagnosis Date Noted  . Complete tear of right rotator cuff 01/21/2015  . Severe obesity (BMI >= 40) 01/21/2015  . Nausea alone 04/04/2013  . H/O adenomatous polyp of colon 04/04/2013  . FATTY LIVER DISEASE 11/11/2010  . RUQ PAIN 11/11/2010  . NONSPEC ELEVATION OF LEVELS OF TRANSAMINASE/LDH 11/11/2010  . COLONIC POLYPS, HYPERPLASTIC, HX OF 06/25/2010  . HYPOTHYROIDISM 06/24/2010  . ANEMIA-NOS 06/24/2010   Melvenia Needles, PTA 03/18/2015 1:48 PM Phone: [phone] Fax: [phone]  [facility] 03/18/2015, 1:48 PM  [place], [place], [address] Phone: [phone]   Fax:  [phone]

## 2015-03-20 ENCOUNTER — Ambulatory Visit: Payer: Medicare Other | Admitting: Physical Therapy

## 2015-03-20 ENCOUNTER — Telehealth: Payer: Self-pay | Admitting: Physical Therapy

## 2015-03-20 DIAGNOSIS — M25611 Stiffness of right shoulder, not elsewhere classified: Secondary | ICD-10-CM

## 2015-03-20 DIAGNOSIS — Z9889 Other specified postprocedural states: Secondary | ICD-10-CM

## 2015-03-20 DIAGNOSIS — M25511 Pain in right shoulder: Secondary | ICD-10-CM | POA: Diagnosis not present

## 2015-03-20 DIAGNOSIS — M6281 Muscle weakness (generalized): Secondary | ICD-10-CM | POA: Diagnosis not present

## 2015-03-20 DIAGNOSIS — R531 Weakness: Secondary | ICD-10-CM

## 2015-03-20 NOTE — Therapy (Signed)
[place], [place], [address] Phone: [phone]   Fax:  [phone]  Physical Therapy Treatment  Patient Details  Name: Cheryl [name] MRN: [id number] Date of Birth: [birth year] Referring Provider:  Sharilyn Sites, MD  Encounter Date: 03/20/2015      PT End of Session - 03/20/15 1227    PT Start Time 0847   PT Stop Time 0945   PT Time Calculation (min) 58 min   Activity Tolerance Patient tolerated treatment well   Behavior During Therapy [facility] for tasks assessed/performed      Past Medical History  Diagnosis Date  . Acid reflux   . Sleep apnea     had surgery to correct  . Sinus drainage   . Arthritis     Past Surgical History  Procedure Laterality Date  . Knee surgery      x2  . Rotator cuff repair      x2  . Trigger finger release    . Carpal tunnel release    . Colonoscopy  02/15/2005    [id number] small polyps ablated via cold biopsy, one from transverse colon and two from the rectum/Small external hemorrhoids  . Colonoscopy  07/10/2010    [id number] rectum/long redundant colon, polyps in the sigmoid, descending, hepatic flexure/ADENOMATOUS POLYPS. next TCS due 06/2015  . Esophagogastroduodenoscopy  1995    Gastritis  . Colonoscopy  1995    3 polyps, path revealed chronic colitis  . Esophagogastroduodenoscopy N/A 04/16/2013    Procedure: ESOPHAGOGASTRODUODENOSCOPY (EGD);  Surgeon: Daneil Dolin, MD;  [facility];  Service: Endoscopy;  Laterality: N/A;  3:30  . Uvulopalatopharyngoplasty    . Shoulder arthroscopy with rotator cuff repair and subacromial decompression Right 01/21/2015    Procedure: RIGHT SHOULDER ARTHROSCOPIC DEBRIDEMNT OF G-H JOINT AND REMOVAL OF LOOSE BODIES,ARTHROSCOPIC SUBACROMIAL DECOMPRESSION,MINI OPEN RCT REPAIR WITH SUPPLEMENTAL [facility] PATCH;  Surgeon: Garald Balding, MD;  Location: [place];  Service: Orthopedics;  Laterality: Right;    There were no  vitals filed for this visit.  Visit Diagnosis:  Decreased strength  S/P arthroscopy of shoulder  S/P rotator cuff repair  Decreased ROM of right shoulder  Pain in joint, shoulder region, right      Subjective Assessment - 03/20/15 0851    Symptoms no pain except for end ranges of motion that my PT helps with.  I can touch my shoulder with my hand but i dont try any further   Pertinent History Pt has sleep apnea, acid reflux    How long can you sit comfortably? unlimited   Patient Stated Goals be able to use my arm for ADLs and household chores   Currently in Pain? No/denies            [facility] PT Assessment - 03/20/15 0001    PROM   Right Shoulder Flexion 150 Degrees  ERP all measurements in supine     Right Shoulder ABduction 132 Degrees  to tightness and discomfort   Right Shoulder Internal Rotation 40 Degrees  to tightnes   Right Shoulder External Rotation 45 Degrees   Left Shoulder Flexion 160 Degrees   Left Shoulder ABduction 160 Degrees   Left Shoulder Internal Rotation 80 Degrees   Left Shoulder External Rotation 58 Degrees   Strength   Overall Strength Unable to assess                   [facility] Adult PT Treatment/Exercise - 03/20/15 [id number]  Cryotherapy   Cryotherapy Location Shoulder   Type of Cryotherapy Ice pack   Manual Therapy   Manual Therapy Passive ROM  flexion, abduction, ER   Scapular Mobilization Left sidelying, scapular mobilzation and soft tissue sub scapularis   Passive ROM --  Rt shoulder, gentle soft tissue work upper trap/levator/supr                  PT Short Term Goals - 03/20/15 0936    PT SHORT TERM GOAL #1   Title "Independent with initial HEP   Baseline patient doing pendulums   Time 4   Period Weeks   Status Achieved   PT SHORT TERM GOAL #2   Title "Report pain decrease from  5  /10 to   3 /10.   Time 4   Period Weeks   Status Achieved   PT SHORT TERM GOAL #3   Title "Demonstrate and verbalize  understanding of condition management including RICE, positioning, HEP.    Time 4   Period Weeks   Status Achieved   PT SHORT TERM GOAL #4   Title Pt caregiver will be able to correctly execute PROM for Right shoulder for pt    Time 2   Period Weeks   Status On-going           PT Long Term Goals - 03/20/15 0936    PT LONG TERM GOAL #1   Title "Pt will be independent with advanced HEP.    Time 8   Period Weeks   Status On-going   PT LONG TERM GOAL #2   Title "Pain will decrease to 1/10 with all functional activities   Baseline limited by protocol   Time 8   Period Weeks   Status On-going   PT LONG TERM GOAL #3   Title "R shoulder AROM scaption will improve to 0-150 degrees for improved overhead reaching.    Baseline limited by protocol   Time 8   Period Weeks   PT LONG TERM GOAL #4   Title "R shoulder AROM will return to [place] to return to pain-free ADLs such as dressing and grooming   Baseline limited by protocol   Time 8   Period Weeks   Status On-going   PT LONG TERM GOAL #5   Title "FOTO will improve from  55%  to  37%   indicating improved functional mobility .    Time 8   Period Weeks   Status Unable to assess               Plan - 03/20/15 0935    Clinical Impression Statement Pt doing well with no pain.   Remeasured for PROM and called Dr. Durward Fortes office to progress R shoulder to AAROM/AROM is MD deems appropriate. Dr. Durward Fortes to send via fax.  Pt with increased PROM and has end range discomfort and  is conscientious about not overdoing.  Pt is 8 weeks post surgery and  needs MD approval before progressing to AAROM    Pt will benefit from skilled therapeutic intervention in order to improve on the following deficits Decreased activity tolerance;Decreased scar mobility;Decreased range of motion;Increased edema;Increased fascial restricitons;Obesity;Pain;Postural dysfunction;Improper body mechanics   Rehab Potential Good   PT Frequency 2x / week  seeing  1 x a week for PROM and will progress when MD approves   PT Treatment/Interventions ADLs/Self Care Home Management;Cryotherapy;Electrical Stimulation;Ultrasound;Moist Heat;Functional mobility training;Neuromuscular re-education;Therapeutic exercise;Therapeutic activities;Patient/family education;Manual techniques;Dry needling;Passive range of motion;Scar mobilization  PT Next Visit Plan FOTO and assess goals. check for FAX for progressing to AAROM/AROM   Consulted and Agree with Plan of Care Patient        Problem List Patient Active Problem List   Diagnosis Date Noted  . Complete tear of right rotator cuff 01/21/2015  . Severe obesity (BMI >= 40) 01/21/2015  . Nausea alone 04/04/2013  . H/O adenomatous polyp of colon 04/04/2013  . FATTY LIVER DISEASE 11/11/2010  . RUQ PAIN 11/11/2010  . NONSPEC ELEVATION OF LEVELS OF TRANSAMINASE/LDH 11/11/2010  . COLONIC POLYPS, HYPERPLASTIC, HX OF 06/25/2010  . HYPOTHYROIDISM 06/24/2010  . ANEMIA-NOS 06/24/2010    Voncille Lo, PT 03/20/2015 12:39 PM Phone: [phone] Fax: [facility] [address] [place], [place], [address] Phone: [phone]   Fax:  [phone]

## 2015-03-25 ENCOUNTER — Ambulatory Visit: Payer: Medicare Other | Attending: Orthopaedic Surgery | Admitting: Physical Therapy

## 2015-03-25 DIAGNOSIS — Z9889 Other specified postprocedural states: Secondary | ICD-10-CM | POA: Insufficient documentation

## 2015-03-25 DIAGNOSIS — M25611 Stiffness of right shoulder, not elsewhere classified: Secondary | ICD-10-CM

## 2015-03-25 DIAGNOSIS — M25511 Pain in right shoulder: Secondary | ICD-10-CM | POA: Diagnosis not present

## 2015-03-25 DIAGNOSIS — M6281 Muscle weakness (generalized): Secondary | ICD-10-CM | POA: Insufficient documentation

## 2015-03-25 DIAGNOSIS — R531 Weakness: Secondary | ICD-10-CM

## 2015-03-25 NOTE — Patient Instructions (Signed)
SHOULDER: External Rotation - Supine (Cane)   Hold cane with both hands. Rotate arm away from body. Keep elbow on floor and next to body. 10___ reps per set, _1-2__ sets per day, _7__ days per week Add towel to keep elbow at side.  Copyright  VHI. All rights reserved.  SUPINE: Shoulder Flexion Bilateral (Cane)   Lie on back with knees bent. Hold cane with both hands. Raise both arms overhead, keep elbows straight. 10___ reps per set, _1-2__ sets per day, _7_ days per week   Copyright  VHI. All rights reserved.  Cane Horizontal - Supine   With straight arms holding cane above shoulders, bring cane out to right, center, out to left, and back to above head. Repeat _10__ times. Do _1-2__ times per day.  Also cane at chest , press cane toward ceiling until elbows are straight. 10 reps 1-2 times a day. Copyright  VHI. All rights reserved.

## 2015-03-25 NOTE — Therapy (Signed)
[place], [place], [address] Phone: [phone]   Fax:  [phone]  Physical Therapy Treatment  Patient Details  Name: Cheryl [name] MRN: [id number] Date of Birth: 05/16/1946 Referring Provider:  Sharilyn Sites, MD  Encounter Date: 03/25/2015      PT End of Session - 03/25/15 1213    Visit Number 10   Number of Visits 16   Date for PT Re-Evaluation 04/17/15   PT Start Time 1112   PT Stop Time [id number]   PT Time Calculation (min) 52 min   Activity Tolerance Patient tolerated treatment well;No increased pain      Past Medical History  Diagnosis Date  . Acid reflux   . Sleep apnea     had surgery to correct  . Sinus drainage   . Arthritis     Past Surgical History  Procedure Laterality Date  . Knee surgery      x2  . Rotator cuff repair      x2  . Trigger finger release    . Carpal tunnel release    . Colonoscopy  02/15/2005    [id number] small polyps ablated via cold biopsy, one from transverse colon and two from the rectum/Small external hemorrhoids  . Colonoscopy  07/10/2010    [id number] rectum/long redundant colon, polyps in the sigmoid, descending, hepatic flexure/ADENOMATOUS POLYPS. next TCS due 06/2015  . Esophagogastroduodenoscopy  1995    Gastritis  . Colonoscopy  1995    3 polyps, path revealed chronic colitis  . Esophagogastroduodenoscopy N/A 04/16/2013    Procedure: ESOPHAGOGASTRODUODENOSCOPY (EGD);  Surgeon: Daneil Dolin, MD;  [facility];  Service: Endoscopy;  Laterality: N/A;  3:30  . Uvulopalatopharyngoplasty    . Shoulder arthroscopy with rotator cuff repair and subacromial decompression Right 01/21/2015    Procedure: RIGHT SHOULDER ARTHROSCOPIC DEBRIDEMNT OF G-H JOINT AND REMOVAL OF LOOSE BODIES,ARTHROSCOPIC SUBACROMIAL DECOMPRESSION,MINI OPEN RCT REPAIR WITH SUPPLEMENTAL [facility] PATCH;  Surgeon: Garald Balding, MD;  Location: [place];  Service: Orthopedics;   Laterality: Right;    There were no vitals filed for this visit.  Visit Diagnosis:  Decreased strength  Decreased ROM of right shoulder  Pain in joint, shoulder region, right      Subjective Assessment - 03/25/15 1110    Subjective Feels better than it di yeaterday.  Upper trap area wanted to hike  up to her ear all day woke up that way.0 pain now,  Sore after last visit.  Not painful.  New order for AA ROM   Currently in Pain? No/denies   Aggravating Factors  cold weather    Pain Relieving Factors resting   Multiple Pain Sites No                       [facility] Adult PT Treatment/Exercise - 03/25/15 1130    Shoulder Exercises: Supine   Flexion Limitations 10 cane   Shoulder Exercises: Standing   Flexion 10 reps   Flexion Limitations table slide standing   Other Standing Exercises wall ladder 4 reps   Cryotherapy   Number Minutes Cryotherapy 10 Minutes   Cryotherapy Location Shoulder   Type of Cryotherapy --  cold pack   Manual Therapy   Manual Therapy --  AA ROM, Rt shoulder Flexion, abduction, ER 10 reps, supine                PT Education - 03/25/15 1210    Education provided  Yes   Education Details home supine cane.  What AA exercises are and how to progress.   Person(s) Educated Patient   Methods Explanation;Demonstration;Tactile cues;Verbal cues;Handout   Comprehension Verbalized understanding;Returned demonstration          PT Short Term Goals - 03/20/15 0936    PT SHORT TERM GOAL #1   Title "Independent with initial HEP   Baseline patient doing pendulums   Time 4   Period Weeks   Status Achieved   PT SHORT TERM GOAL #2   Title "Report pain decrease from  5  /10 to   3 /10.   Time 4   Period Weeks   Status Achieved   PT SHORT TERM GOAL #3   Title "Demonstrate and verbalize understanding of condition management including RICE, positioning, HEP.    Time 4   Period Weeks   Status Achieved   PT SHORT TERM GOAL #4   Title Pt  caregiver will be able to correctly execute PROM for Right shoulder for pt    Time 2   Period Weeks   Status On-going           PT Long Term Goals - 03/25/15 1221    PT LONG TERM GOAL #1   Title "Pt will be independent with advanced HEP.    Time 8   Period Weeks   Status On-going   PT LONG TERM GOAL #2   Title "Pain will decrease to 1/10 with all functional activities   Baseline limited by protocol   Time 8   Period Weeks   Status On-going   PT LONG TERM GOAL #3   Title "R shoulder AROM scaption will improve to 0-150 degrees for improved overhead reaching.    Baseline limited by protocol   Time 8   Period Weeks   Status On-going   PT LONG TERM GOAL #4   Baseline limited by protocol   Time 8   Period Weeks   Status On-going   PT LONG TERM GOAL #5   Title "FOTO will improve from  55%  to  37%   indicating improved functional mobility .    Baseline 51% limitation   Time 8   Period Weeks   Status On-going               Plan - 03/25/15 1220    PT Next Visit Plan review supine cane exercises.  Continue AA.  AA also cleared with PT Voncille Lo.   Consulted and Agree with Plan of Care Patient        Problem List Patient Active Problem List   Diagnosis Date Noted  . Complete tear of right rotator cuff 01/21/2015  . Severe obesity (BMI >= 40) 01/21/2015  . Nausea alone 04/04/2013  . H/O adenomatous polyp of colon 04/04/2013  . FATTY LIVER DISEASE 11/11/2010  . RUQ PAIN 11/11/2010  . NONSPEC ELEVATION OF LEVELS OF TRANSAMINASE/LDH 11/11/2010  . COLONIC POLYPS, HYPERPLASTIC, HX OF 06/25/2010  . HYPOTHYROIDISM 06/24/2010  . ANEMIA-NOS 06/24/2010    HARRIS,KAREN 03/25/2015, 12:28 PM  [facility] [address] [place], [place], [address] Phone: [phone]   Fax:  [phone]

## 2015-03-27 ENCOUNTER — Ambulatory Visit: Payer: Medicare Other | Admitting: Physical Therapy

## 2015-03-27 DIAGNOSIS — Z9889 Other specified postprocedural states: Secondary | ICD-10-CM | POA: Diagnosis not present

## 2015-03-27 DIAGNOSIS — M25511 Pain in right shoulder: Secondary | ICD-10-CM

## 2015-03-27 DIAGNOSIS — M25611 Stiffness of right shoulder, not elsewhere classified: Secondary | ICD-10-CM

## 2015-03-27 DIAGNOSIS — M6281 Muscle weakness (generalized): Secondary | ICD-10-CM | POA: Diagnosis not present

## 2015-03-27 DIAGNOSIS — R531 Weakness: Secondary | ICD-10-CM

## 2015-03-27 NOTE — Therapy (Signed)
[place], [place], [address] Phone: [phone]   Fax:  [phone]  Physical Therapy Treatment  Patient Details  Name: Cheryl [name] MRN: [id number] Date of Birth: [birth year] Referring Provider:  Sharilyn Sites, MD  Encounter Date: 03/27/2015      PT End of Session - 03/27/15 1313    Visit Number 11   Number of Visits 16   Date for PT Re-Evaluation 04/17/15   PT Start Time 1102   PT Stop Time 1203   PT Time Calculation (min) 61 min   Activity Tolerance Patient tolerated treatment well      Past Medical History  Diagnosis Date  . Acid reflux   . Sleep apnea     had surgery to correct  . Sinus drainage   . Arthritis     Past Surgical History  Procedure Laterality Date  . Knee surgery      x2  . Rotator cuff repair      x2  . Trigger finger release    . Carpal tunnel release    . Colonoscopy  02/15/2005    [id number] small polyps ablated via cold biopsy, one from transverse colon and two from the rectum/Small external hemorrhoids  . Colonoscopy  07/10/2010    [id number] rectum/long redundant colon, polyps in the sigmoid, descending, hepatic flexure/ADENOMATOUS POLYPS. next TCS due 06/2015  . Esophagogastroduodenoscopy  1995    Gastritis  . Colonoscopy  1995    3 polyps, path revealed chronic colitis  . Esophagogastroduodenoscopy N/A 04/16/2013    Procedure: ESOPHAGOGASTRODUODENOSCOPY (EGD);  Surgeon: Daneil Dolin, MD;  [facility];  Service: Endoscopy;  Laterality: N/A;  3:30  . Uvulopalatopharyngoplasty    . Shoulder arthroscopy with rotator cuff repair and subacromial decompression Right 01/21/2015    Procedure: RIGHT SHOULDER ARTHROSCOPIC DEBRIDEMNT OF G-H JOINT AND REMOVAL OF LOOSE BODIES,ARTHROSCOPIC SUBACROMIAL DECOMPRESSION,MINI OPEN RCT REPAIR WITH SUPPLEMENTAL [facility] PATCH;  Surgeon: Garald Balding, MD;  Location: [place];  Service: Orthopedics;  Laterality: Right;     There were no vitals filed for this visit.  Visit Diagnosis:  Decreased strength  Decreased ROM of right shoulder  Pain in joint, shoulder region, right      Subjective Assessment - 03/27/15 1108    Subjective Did her home exercises yesterday.  No shoulder pain, just stiff   Pain Score 0-No pain   Multiple Pain Sites No                       [facility] Adult PT Treatment/Exercise - 03/27/15 1118    Shoulder Exercises: Supine   External Rotation AAROM;Both;10 reps;Other (comment)   Theraband Level (Shoulder External Rotation) --  cane   Flexion Limitations 10 cane   ABduction AAROM;Both;10 reps;Other (comment)   Theraband Level (Shoulder ABduction) --  cane   Other Supine Exercises supine cane, flex, ER, abd horizontal 10   Cryotherapy   Number Minutes Cryotherapy 10 Minutes   Cryotherapy Location Shoulder   Type of Cryotherapy --  cold pack, sitting   Manual Therapy   Manual Therapy --  153degrees, Flexion, ABD 127 degrees   Passive ROM Rt shoulder flexion, abduction, ER                  PT Short Term Goals - 03/27/15 1315    PT SHORT TERM GOAL #1   Title "Independent with initial HEP   Time 4   Period Weeks  Status Achieved   PT SHORT TERM GOAL #2   Title "Report pain decrease from  5  /10 to   3 /10.   Time 4   Period Weeks   Status Achieved   PT SHORT TERM GOAL #3   Title "Demonstrate and verbalize understanding of condition management including RICE, positioning, HEP.    Time 4   Period Weeks   Status Achieved   PT SHORT TERM GOAL #4   Title Pt caregiver will be able to correctly execute PROM for Right shoulder for pt    Baseline Patient confided person who drives her  does not work with her shoulder   Time 2   Period Weeks   Status Deferred           PT Long Term Goals - 03/27/15 1316    PT LONG TERM GOAL #1   Title "Pt will be independent with advanced HEP.    Time 8   Period Weeks   Status On-going   PT LONG  TERM GOAL #2   Title "Pain will decrease to 1/10 with all functional activities   Baseline limited by protocol   Time 8   Period Weeks   Status On-going   PT LONG TERM GOAL #3   Title "R shoulder AROM scaption will improve to 0-150 degrees for improved overhead reaching.    Baseline limited by protocol   Time 8   Period Weeks   Status On-going   PT LONG TERM GOAL #4   Title "R shoulder AROM will return to [facility] to return to pain-free ADLs such as dressing and grooming   Time 8   Period Weeks   Status On-going   PT LONG TERM GOAL #5   Title "FOTO will improve from  55%  to  37%   indicating improved functional mobility .    Time 8   Period Weeks   Status Unable to assess               Plan - 03/27/15 1314    Clinical Impression Statement continue with AA PROM.  Rom gradually improving   PT Next Visit Plan continue AA/PROM for now.        Problem List Patient Active Problem List   Diagnosis Date Noted  . Complete tear of right rotator cuff 01/21/2015  . Severe obesity (BMI >= 40) 01/21/2015  . Nausea alone 04/04/2013  . H/O adenomatous polyp of colon 04/04/2013  . FATTY LIVER DISEASE 11/11/2010  . RUQ PAIN 11/11/2010  . NONSPEC ELEVATION OF LEVELS OF TRANSAMINASE/LDH 11/11/2010  . COLONIC POLYPS, HYPERPLASTIC, HX OF 06/25/2010  . HYPOTHYROIDISM 06/24/2010  . ANEMIA-NOS 06/24/2010    HARRIS,KAREN 03/27/2015, 1:18 PM Melvenia Needles, PTA 03/27/2015 1:18 PM Phone: [phone] Fax: [facility] [address] [place], [place], [address] Phone: [phone]   Fax:  [phone]

## 2015-03-31 ENCOUNTER — Ambulatory Visit: Payer: Medicare Other | Admitting: Physical Therapy

## 2015-03-31 DIAGNOSIS — M25611 Stiffness of right shoulder, not elsewhere classified: Secondary | ICD-10-CM

## 2015-03-31 DIAGNOSIS — Z9889 Other specified postprocedural states: Secondary | ICD-10-CM | POA: Diagnosis not present

## 2015-03-31 DIAGNOSIS — M6281 Muscle weakness (generalized): Secondary | ICD-10-CM | POA: Diagnosis not present

## 2015-03-31 DIAGNOSIS — M25511 Pain in right shoulder: Secondary | ICD-10-CM | POA: Diagnosis not present

## 2015-03-31 NOTE — Therapy (Signed)
[place], [place], [address] Phone: [phone]   Fax:  [phone]  Physical Therapy Treatment  Patient Details  Name: Cheryl [name] MRN: [id number] Date of Birth: 09/16/1946 Referring Provider:  Sharilyn Sites, MD  Encounter Date: 03/31/2015      PT End of Session - 03/31/15 1727    Visit Number 12   Number of Visits 16   Date for PT Re-Evaluation 04/17/15   PT Start Time 1632   PT Stop Time 1710   PT Time Calculation (min) 38 min   Activity Tolerance Patient tolerated treatment well      Past Medical History  Diagnosis Date  . Acid reflux   . Sleep apnea     had surgery to correct  . Sinus drainage   . Arthritis     Past Surgical History  Procedure Laterality Date  . Knee surgery      x2  . Rotator cuff repair      x2  . Trigger finger release    . Carpal tunnel release    . Colonoscopy  02/15/2005    [id number] small polyps ablated via cold biopsy, one from transverse colon and two from the rectum/Small external hemorrhoids  . Colonoscopy  07/10/2010    [id number] rectum/long redundant colon, polyps in the sigmoid, descending, hepatic flexure/ADENOMATOUS POLYPS. next TCS due 06/2015  . Esophagogastroduodenoscopy  1995    Gastritis  . Colonoscopy  1995    3 polyps, path revealed chronic colitis  . Esophagogastroduodenoscopy N/A 04/16/2013    Procedure: ESOPHAGOGASTRODUODENOSCOPY (EGD);  Surgeon: Daneil Dolin, MD;  [facility];  Service: Endoscopy;  Laterality: N/A;  3:30  . Uvulopalatopharyngoplasty    . Shoulder arthroscopy with rotator cuff repair and subacromial decompression Right 01/21/2015    Procedure: RIGHT SHOULDER ARTHROSCOPIC DEBRIDEMNT OF G-H JOINT AND REMOVAL OF LOOSE BODIES,ARTHROSCOPIC SUBACROMIAL DECOMPRESSION,MINI OPEN RCT REPAIR WITH SUPPLEMENTAL [facility] PATCH;  Surgeon: Garald Balding, MD;  Location: [place];  Service: Orthopedics;  Laterality: Right;     There were no vitals filed for this visit.  Visit Diagnosis:  Decreased ROM of right shoulder      Subjective Assessment - 03/31/15 1630    Subjective Getting more motion with cane exercises at home.   Pain Score 0-No pain   Multiple Pain Sites No                       [facility] Adult PT Treatment/Exercise - 03/31/15 1632    Shoulder Exercises: Supine   External Rotation AAROM   Flexion Limitations 165 degrees with cane   Shoulder Exercises: Seated   External Rotation AAROM  using cane and using PTA multiple sets of reps.   External Rotation Limitations 55 degrees   Flexion --  AA Flexion, multiple sets of reps with PTA. Tried cane, but    Other Seated Exercises rolling ball on thigh   Other Seated Exercises Elbow flexion/extension AA with PTA   Shoulder Exercises: Standing   Flexion 5 reps  rolling yellow ball up wall.  compensation even with cues so   Wrist Exercises   Other wrist exercises AA wrist flexion/extension  2 sets of 10   Manual Therapy   Manual Therapy Passive ROM  stronger                  PT Short Term Goals - 03/27/15 1315    PT SHORT TERM GOAL #1  Title "Independent with initial HEP   Time 4   Period Weeks   Status Achieved   PT SHORT TERM GOAL #2   Title "Report pain decrease from  5  /10 to   3 /10.   Time 4   Period Weeks   Status Achieved   PT SHORT TERM GOAL #3   Title "Demonstrate and verbalize understanding of condition management including RICE, positioning, HEP.    Time 4   Period Weeks   Status Achieved   PT SHORT TERM GOAL #4   Title Pt caregiver will be able to correctly execute PROM for Right shoulder for pt    Baseline Patient confided person who drives her  does not work with her shoulder   Time 2   Period Weeks   Status Deferred           PT Long Term Goals - 03/31/15 1730    PT LONG TERM GOAL #1   Title "Pt will be independent with advanced HEP.    Time 8   Period Weeks   Status  On-going   PT LONG TERM GOAL #2   Title "Pain will decrease to 1/10 with all functional activities   Baseline limited by protocol   Time 8   Period Weeks   Status On-going   PT LONG TERM GOAL #3   Title "R shoulder AROM scaption will improve to 0-150 degrees for improved overhead reaching.    Baseline limited by protocol   Time 8   Period Weeks   Status On-going   PT LONG TERM GOAL #4   Title "R shoulder AROM will return to [facility] to return to pain-free ADLs such as dressing and grooming   Baseline limited by protocol   Time 8   Period Weeks   Status On-going   PT LONG TERM GOAL #5   Title "FOTO will improve from  55%  to  37%   indicating improved functional mobility .    Time 8   Period Weeks   Status Unable to assess               Plan - 03/31/15 1729    Clinical Impression Statement ROM Greatly improved flexion supine.  Still limited by MD orders AA for now.   PT Next Visit Plan AA send MD note.        Problem List Patient Active Problem List   Diagnosis Date Noted  . Complete tear of right rotator cuff 01/21/2015  . Severe obesity (BMI >= 40) 01/21/2015  . Nausea alone 04/04/2013  . H/O adenomatous polyp of colon 04/04/2013  . FATTY LIVER DISEASE 11/11/2010  . RUQ PAIN 11/11/2010  . NONSPEC ELEVATION OF LEVELS OF TRANSAMINASE/LDH 11/11/2010  . COLONIC POLYPS, HYPERPLASTIC, HX OF 06/25/2010  . HYPOTHYROIDISM 06/24/2010  . ANEMIA-NOS 06/24/2010    Alene Bergerson 03/31/2015, 5:32 PM Melvenia Needles, PTA 03/31/2015 5:32 PM Phone: [phone] Fax: [facility] [address] [place], [place], [address] Phone: [phone]   Fax:  [phone]

## 2015-04-01 ENCOUNTER — Ambulatory Visit: Payer: Medicare Other | Admitting: Physical Therapy

## 2015-04-01 DIAGNOSIS — R531 Weakness: Secondary | ICD-10-CM

## 2015-04-01 DIAGNOSIS — Z9889 Other specified postprocedural states: Secondary | ICD-10-CM | POA: Diagnosis not present

## 2015-04-01 DIAGNOSIS — M6281 Muscle weakness (generalized): Secondary | ICD-10-CM | POA: Diagnosis not present

## 2015-04-01 DIAGNOSIS — M25611 Stiffness of right shoulder, not elsewhere classified: Secondary | ICD-10-CM

## 2015-04-01 DIAGNOSIS — M25511 Pain in right shoulder: Secondary | ICD-10-CM | POA: Diagnosis not present

## 2015-04-01 NOTE — Therapy (Signed)
[place], [place], [address] Phone: [phone]   Fax:  [phone]  Physical Therapy Treatment  Patient Details  Name: Cheryl [name] MRN: [id number] Date of Birth: 1946-05-06 Referring Provider:  Sharilyn Sites, MD  Encounter Date: 04/01/2015      PT End of Session - 04/01/15 1638    Activity Tolerance Patient tolerated treatment well;Other (comment)  2/10      Past Medical History  Diagnosis Date  . Acid reflux   . Sleep apnea     had surgery to correct  . Sinus drainage   . Arthritis     Past Surgical History  Procedure Laterality Date  . Knee surgery      x2  . Rotator cuff repair      x2  . Trigger finger release    . Carpal tunnel release    . Colonoscopy  02/15/2005    GYK:[id number] small polyps ablated via cold biopsy, one from transverse colon and two from the rectum/Small external hemorrhoids  . Colonoscopy  07/10/2010    [id number] rectum/long redundant colon, polyps in the sigmoid, descending, hepatic flexure/ADENOMATOUS POLYPS. next TCS due 06/2015  . Esophagogastroduodenoscopy  1995    Gastritis  . Colonoscopy  1995    3 polyps, path revealed chronic colitis  . Esophagogastroduodenoscopy N/A 04/16/2013    Procedure: ESOPHAGOGASTRODUODENOSCOPY (EGD);  Surgeon: Daneil Dolin, MD;  [facility];  Service: Endoscopy;  Laterality: N/A;  3:30  . Uvulopalatopharyngoplasty    . Shoulder arthroscopy with rotator cuff repair and subacromial decompression Right 01/21/2015    Procedure: RIGHT SHOULDER ARTHROSCOPIC DEBRIDEMNT OF G-H JOINT AND REMOVAL OF LOOSE BODIES,ARTHROSCOPIC SUBACROMIAL DECOMPRESSION,MINI OPEN RCT REPAIR WITH SUPPLEMENTAL [facility] PATCH;  Surgeon: Garald Balding, MD;  Location: [place];  Service: Orthopedics;  Laterality: Right;    There were no vitals filed for this visit.  Visit Diagnosis:  No diagnosis found.      Subjective Assessment - 04/01/15 0851    Subjective (p) About the same as yesterday.   Currently in Pain? (p) No/denies   Pain Score (p) 0-No pain                       [facility] Adult PT Treatment/Exercise - 04/01/15 0917    Shoulder Exercises: Supine   External Rotation AAROM  cane   Theraband Level (Shoulder Flexion) --  AAROM, cane   ABduction AAROM   Cryotherapy   Number Minutes Cryotherapy 10 Minutes   Cryotherapy Location Shoulder   Manual Therapy   Manual Therapy --  AA and passive Range, Rt soft tissue Rt neck and upper trap.   Passive ROM Rt Shoulder                  PT Short Term Goals - 04/01/15 1028    PT SHORT TERM GOAL #1   Title "Independent with initial HEP   Time 4   Period Weeks   Status Achieved   PT SHORT TERM GOAL #2   Title "Report pain decrease from  5  /10 to   3 /10.   Time 4   Period Weeks   Status Achieved   PT SHORT TERM GOAL #3   Title "Demonstrate and verbalize understanding of condition management including RICE, positioning, HEP.    Time 4   Period Weeks   Status Achieved   PT SHORT TERM GOAL #4   Title Pt caregiver will  be able to correctly execute PROM for Right shoulder for pt    Status Deferred           PT Long Term Goals - 03/31/15 1730    PT LONG TERM GOAL #1   Title "Pt will be independent with advanced HEP.    Time 8   Period Weeks   Status On-going   PT LONG TERM GOAL #2   Title "Pain will decrease to 1/10 with all functional activities   Baseline limited by protocol   Time 8   Period Weeks   Status On-going   PT LONG TERM GOAL #3   Title "R shoulder AROM scaption will improve to 0-150 degrees for improved overhead reaching.    Baseline limited by protocol   Time 8   Period Weeks   Status On-going   PT LONG TERM GOAL #4   Title "R shoulder AROM will return to [facility] to return to pain-free ADLs such as dressing and grooming   Baseline limited by protocol   Time 8   Period Weeks   Status On-going   PT LONG TERM GOAL #5   Title  "FOTO will improve from  55%  to  37%   indicating improved functional mobility .    Time 8   Period Weeks   Status Unable to assess               Plan - 04/01/15 0930    Clinical Impression Statement Continued AAROM and passive stretchs. Npo new goals met.   PT Next Visit Plan AA,         Problem List Patient Active Problem List   Diagnosis Date Noted  . Complete tear of right rotator cuff 01/21/2015  . Severe obesity (BMI >= 40) 01/21/2015  . Nausea alone 04/04/2013  . H/O adenomatous polyp of colon 04/04/2013  . FATTY LIVER DISEASE 11/11/2010  . RUQ PAIN 11/11/2010  . NONSPEC ELEVATION OF LEVELS OF TRANSAMINASE/LDH 11/11/2010  . COLONIC POLYPS, HYPERPLASTIC, HX OF 06/25/2010  . HYPOTHYROIDISM 06/24/2010  . ANEMIA-NOS 06/24/2010    HARRIS,KAREN 04/01/2015, 10:31 AM  [facility] [address], [place], [address] Phone: [phone]   Fax:  [phone]

## 2015-04-07 ENCOUNTER — Ambulatory Visit: Payer: Medicare Other | Admitting: Physical Therapy

## 2015-04-07 DIAGNOSIS — M25611 Stiffness of right shoulder, not elsewhere classified: Secondary | ICD-10-CM

## 2015-04-07 DIAGNOSIS — M6281 Muscle weakness (generalized): Secondary | ICD-10-CM | POA: Diagnosis not present

## 2015-04-07 DIAGNOSIS — M25511 Pain in right shoulder: Secondary | ICD-10-CM | POA: Diagnosis not present

## 2015-04-07 DIAGNOSIS — R531 Weakness: Secondary | ICD-10-CM

## 2015-04-07 DIAGNOSIS — Z9889 Other specified postprocedural states: Secondary | ICD-10-CM | POA: Diagnosis not present

## 2015-04-07 NOTE — Therapy (Signed)
[place], [place], [address] Phone: [phone]   Fax:  [phone]  Physical Therapy Treatment  Patient Details  Name: Cheryl [name] MRN: [id number] Date of Birth: Dec 11, 1946 Referring Provider:  Sharilyn Sites, MD  Encounter Date: 04/07/2015      PT End of Session - 04/07/15 1733    Visit Number 14   Number of Visits 16   Date for PT Re-Evaluation 04/17/15   PT Start Time 8676   PT Stop Time 1720   PT Time Calculation (min) 45 min   Activity Tolerance Patient tolerated treatment well      Past Medical History  Diagnosis Date  . Acid reflux   . Sleep apnea     had surgery to correct  . Sinus drainage   . Arthritis     Past Surgical History  Procedure Laterality Date  . Knee surgery      x2  . Rotator cuff repair      x2  . Trigger finger release    . Carpal tunnel release    . Colonoscopy  02/15/2005    [id number]:[id number] small polyps ablated via cold biopsy, one from transverse colon and two from the rectum/Small external hemorrhoids  . Colonoscopy  07/10/2010    [id number] rectum/long redundant colon, polyps in the sigmoid, descending, hepatic flexure/ADENOMATOUS POLYPS. next TCS due 06/2015  . Esophagogastroduodenoscopy  1995    Gastritis  . Colonoscopy  1995    3 polyps, path revealed chronic colitis  . Esophagogastroduodenoscopy N/A 04/16/2013    Procedure: ESOPHAGOGASTRODUODENOSCOPY (EGD);  Surgeon: Daneil Dolin, MD;  [facility];  Service: Endoscopy;  Laterality: N/A;  3:30  . Uvulopalatopharyngoplasty    . Shoulder arthroscopy with rotator cuff repair and subacromial decompression Right 01/21/2015    Procedure: RIGHT SHOULDER ARTHROSCOPIC DEBRIDEMNT OF G-H JOINT AND REMOVAL OF LOOSE BODIES,ARTHROSCOPIC SUBACROMIAL DECOMPRESSION,MINI OPEN RCT REPAIR WITH SUPPLEMENTAL [facility] PATCH;  Surgeon: Garald Balding, MD;  Location: [place];  Service: Orthopedics;  Laterality: Right;     There were no vitals filed for this visit.  Visit Diagnosis:  Decreased ROM of right shoulder  Decreased strength      Subjective Assessment - 04/07/15 1640    Subjective doing her home exercises   Currently in Pain? No/denies   Multiple Pain Sites No                         [facility] Adult PT Treatment/Exercise - 04/07/15 1645    Shoulder Exercises: Seated   External Rotation --  Cane flexion, abduction, ER seated   Shoulder Exercises: Standing   Other Standing Exercises wall ladder flexion 5 reps, SBA   Other Standing Exercises ball rolling on wall 5 reps 2 sets   Cryotherapy   Number Minutes Cryotherapy 7 Minutes   Cryotherapy Location Shoulder   Manual Therapy   Manual Therapy --  AA, Passive Rt shoulder, Flexion, abduction, ER Multiple X                  PT Short Term Goals - 04/01/15 1028    PT SHORT TERM GOAL #1   Title "Independent with initial HEP   Time 4   Period Weeks   Status Achieved   PT SHORT TERM GOAL #2   Title "Report pain decrease from  5  /10 to   3 /10.   Time 4   Period Weeks  Status Achieved   PT SHORT TERM GOAL #3   Title "Demonstrate and verbalize understanding of condition management including RICE, positioning, HEP.    Time 4   Period Weeks   Status Achieved   PT SHORT TERM GOAL #4   Title Pt caregiver will be able to correctly execute PROM for Right shoulder for pt    Status Deferred           PT Long Term Goals - 04/07/15 1737    PT LONG TERM GOAL #1   Title "Pt will be independent with advanced HEP.    Time 8   Status On-going   PT LONG TERM GOAL #2   Title "Pain will decrease to 1/10 with all functional activities   Baseline limited by protocol   Time 8   Period Weeks   Status On-going   PT LONG TERM GOAL #3   Title "R shoulder AROM scaption will improve to 0-150 degrees for improved overhead reaching.    Baseline limited by protocol   Time 8   Period Weeks   Status On-going   PT LONG  TERM GOAL #4   Title "R shoulder AROM will return to [facility] to return to pain-free ADLs such as dressing and grooming   Baseline limited by protocol   Time 8   Status On-going   PT LONG TERM GOAL #5   Title "FOTO will improve from  55%  to  37%   indicating improved functional mobility .    Time 8   Period Weeks   Status Unable to assess               Plan - 04/07/15 1736    Clinical Impression Statement Able to do cane sitting , flexion vs supine.  AA allowed, no new goals met.   PT Next Visit Plan AA, measure   Consulted and Agree with Plan of Care Patient        Problem List Patient Active Problem List   Diagnosis Date Noted  . Complete tear of right rotator cuff 01/21/2015  . Severe obesity (BMI >= 40) 01/21/2015  . Nausea alone 04/04/2013  . H/O adenomatous polyp of colon 04/04/2013  . FATTY LIVER DISEASE 11/11/2010  . RUQ PAIN 11/11/2010  . NONSPEC ELEVATION OF LEVELS OF TRANSAMINASE/LDH 11/11/2010  . COLONIC POLYPS, HYPERPLASTIC, HX OF 06/25/2010  . HYPOTHYROIDISM 06/24/2010  . ANEMIA-NOS 06/24/2010    HARRIS,KAREN 04/07/2015, 5:40 PM  [facility] [address] [place], [place], [address] Phone: [phone]   Fax:  [phone]    Melvenia Needles, PTA 04/07/2015 5:40 PM Phone: [phone] Fax: [phone]

## 2015-04-08 ENCOUNTER — Ambulatory Visit: Payer: Medicare Other | Admitting: Physical Therapy

## 2015-04-08 DIAGNOSIS — M6281 Muscle weakness (generalized): Secondary | ICD-10-CM | POA: Diagnosis not present

## 2015-04-08 DIAGNOSIS — M25511 Pain in right shoulder: Secondary | ICD-10-CM | POA: Diagnosis not present

## 2015-04-08 DIAGNOSIS — M25611 Stiffness of right shoulder, not elsewhere classified: Secondary | ICD-10-CM

## 2015-04-08 DIAGNOSIS — Z9889 Other specified postprocedural states: Secondary | ICD-10-CM | POA: Diagnosis not present

## 2015-04-08 DIAGNOSIS — R531 Weakness: Secondary | ICD-10-CM

## 2015-04-08 NOTE — Therapy (Signed)
[place], [place], [address] Phone: [phone]   Fax:  [phone]  Physical Therapy Treatment  Patient Details  Name: Cheryl [name] MRN: [id number] Date of Birth: 07/08/1946 Referring Provider:  Sharilyn Sites, MD  Encounter Date: 04/08/2015      PT End of Session - 04/08/15 1025    Visit Number 15   Number of Visits 16   Date for PT Re-Evaluation 04/17/15   PT Start Time 0850   PT Stop Time 0945   PT Time Calculation (min) 55 min   Activity Tolerance Patient tolerated treatment well      Past Medical History  Diagnosis Date  . Acid reflux   . Sleep apnea     had surgery to correct  . Sinus drainage   . Arthritis     Past Surgical History  Procedure Laterality Date  . Knee surgery      x2  . Rotator cuff repair      x2  . Trigger finger release    . Carpal tunnel release    . Colonoscopy  02/15/2005    [id number] small polyps ablated via cold biopsy, one from transverse colon and two from the rectum/Small external hemorrhoids  . Colonoscopy  07/10/2010    [id number] rectum/long redundant colon, polyps in the sigmoid, descending, hepatic flexure/ADENOMATOUS POLYPS. next TCS due 06/2015  . Esophagogastroduodenoscopy  1995    Gastritis  . Colonoscopy  1995    3 polyps, path revealed chronic colitis  . Esophagogastroduodenoscopy N/A 04/16/2013    Procedure: ESOPHAGOGASTRODUODENOSCOPY (EGD);  Surgeon: Daneil Dolin, MD;  [facility];  Service: Endoscopy;  Laterality: N/A;  3:30  . Uvulopalatopharyngoplasty    . Shoulder arthroscopy with rotator cuff repair and subacromial decompression Right 01/21/2015    Procedure: RIGHT SHOULDER ARTHROSCOPIC DEBRIDEMNT OF G-H JOINT AND REMOVAL OF LOOSE BODIES,ARTHROSCOPIC SUBACROMIAL DECOMPRESSION,MINI OPEN RCT REPAIR WITH SUPPLEMENTAL [facility] PATCH;  Surgeon: Garald Balding, MD;  Location: [facility];  Service: Orthopedics;  Laterality: Right;     There were no vitals filed for this visit.  Visit Diagnosis:  Decreased ROM of right shoulder  Decreased strength      Subjective Assessment - 04/07/15 1640    Subjective doing her home exercises   Currently in Pain? No/denies   Multiple Pain Sites No                         [facility] Adult PT Treatment/Exercise - 04/08/15 0917    Shoulder Exercises: Supine   ABduction --  165 PROM   Shoulder Exercises: Seated   External Rotation Limitations AA ROM 55 degrees   Flexion Limitations 147 AA, Passive152   ABduction Limitations AAROM 76 degrees,, PROM 76  Lt   Other Seated Exercises cane, flexion, abduction ER multiple reps.   Cryotherapy   Number Minutes Cryotherapy 10 Minutes   Cryotherapy Location Shoulder   Type of Cryotherapy --  cold pack   Manual Therapy   Manual Therapy Passive ROM  Also AA ROM RT   Passive ROM Rt shoulder                  PT Short Term Goals - 04/01/15 1028    PT SHORT TERM GOAL #1   Title "Independent with initial HEP   Time 4   Period Weeks   Status Achieved   PT SHORT TERM GOAL #2   Title "Report pain  decrease from  5  /10 to   3 /10.   Time 4   Period Weeks   Status Achieved   PT SHORT TERM GOAL #3   Title "Demonstrate and verbalize understanding of condition management including RICE, positioning, HEP.    Time 4   Period Weeks   Status Achieved   PT SHORT TERM GOAL #4   Title Pt caregiver will be able to correctly execute PROM for Right shoulder for pt    Status Deferred           PT Long Term Goals - 04/07/15 1737    PT LONG TERM GOAL #1   Title "Pt will be independent with advanced HEP.    Time 8   Status On-going   PT LONG TERM GOAL #2   Title "Pain will decrease to 1/10 with all functional activities   Baseline limited by protocol   Time 8   Period Weeks   Status On-going   PT LONG TERM GOAL #3   Title "R shoulder AROM scaption will improve to 0-150 degrees for improved overhead  reaching.    Baseline limited by protocol   Time 8   Period Weeks   Status On-going   PT LONG TERM GOAL #4   Title "R shoulder AROM will return to [facility] to return to pain-free ADLs such as dressing and grooming   Baseline limited by protocol   Time 8   Status On-going   PT LONG TERM GOAL #5   Title "FOTO will improve from  55%  to  37%   indicating improved functional mobility .    Time 8   Period Weeks   Status Unable to assess               Plan - 04/08/15 1026    Clinical Impression Statement AA and passive stretches continue.  Goals limited by protocol.   PT Next Visit Plan AA ROM   Consulted and Agree with Plan of Care Patient        Problem List Patient Active Problem List   Diagnosis Date Noted  . Complete tear of right rotator cuff 01/21/2015  . Severe obesity (BMI >= 40) 01/21/2015  . Nausea alone 04/04/2013  . H/O adenomatous polyp of colon 04/04/2013  . FATTY LIVER DISEASE 11/11/2010  . RUQ PAIN 11/11/2010  . NONSPEC ELEVATION OF LEVELS OF TRANSAMINASE/LDH 11/11/2010  . COLONIC POLYPS, HYPERPLASTIC, HX OF 06/25/2010  . HYPOTHYROIDISM 06/24/2010  . ANEMIA-NOS 06/24/2010    Gennette Shadix 04/08/2015, 10:32 AM  [facility] [address] [place], [place], [address] Phone: [phone]   Fax:  [phone]    Melvenia Needles, PTA 04/08/2015 10:32 AM Phone: [phone] Fax: [phone]

## 2015-04-15 ENCOUNTER — Ambulatory Visit: Payer: Medicare Other | Admitting: Physical Therapy

## 2015-04-17 ENCOUNTER — Ambulatory Visit: Payer: Medicare Other | Admitting: Physical Therapy

## 2015-04-17 DIAGNOSIS — R531 Weakness: Secondary | ICD-10-CM

## 2015-04-17 DIAGNOSIS — Z9889 Other specified postprocedural states: Secondary | ICD-10-CM | POA: Diagnosis not present

## 2015-04-17 DIAGNOSIS — M25511 Pain in right shoulder: Secondary | ICD-10-CM | POA: Diagnosis not present

## 2015-04-17 DIAGNOSIS — M6281 Muscle weakness (generalized): Secondary | ICD-10-CM | POA: Diagnosis not present

## 2015-04-17 NOTE — Therapy (Signed)
[place], [place], [address] Phone: [phone]   Fax:  [phone]  Physical Therapy Treatment  Patient Details  Name: Cheryl [name] MRN: [id number] Date of Birth: 03/21/1946 Referring Provider:  Sharilyn Sites, MD  Encounter Date: 04/17/2015      PT End of Session - 04/17/15 1326    PT Start Time 1145   PT Stop Time 1240   PT Time Calculation (min) 55 min   Activity Tolerance Patient tolerated treatment well      Past Medical History  Diagnosis Date  . Acid reflux   . Sleep apnea     had surgery to correct  . Sinus drainage   . Arthritis     Past Surgical History  Procedure Laterality Date  . Knee surgery      x2  . Rotator cuff repair      x2  . Trigger finger release    . Carpal tunnel release    . Colonoscopy  02/15/2005    [id number] small polyps ablated via cold biopsy, one from transverse colon and two from the rectum/Small external hemorrhoids  . Colonoscopy  07/10/2010    [id number] rectum/long redundant colon, polyps in the sigmoid, descending, hepatic flexure/ADENOMATOUS POLYPS. next TCS due 06/2015  . Esophagogastroduodenoscopy  1995    Gastritis  . Colonoscopy  1995    3 polyps, path revealed chronic colitis  . Esophagogastroduodenoscopy N/A 04/16/2013    Procedure: ESOPHAGOGASTRODUODENOSCOPY (EGD);  Surgeon: Daneil Dolin, MD;  [facility];  Service: Endoscopy;  Laterality: N/A;  3:30  . Uvulopalatopharyngoplasty    . Shoulder arthroscopy with rotator cuff repair and subacromial decompression Right 01/21/2015    Procedure: RIGHT SHOULDER ARTHROSCOPIC DEBRIDEMNT OF G-H JOINT AND REMOVAL OF LOOSE BODIES,ARTHROSCOPIC SUBACROMIAL DECOMPRESSION,MINI OPEN RCT REPAIR WITH SUPPLEMENTAL [facility] PATCH;  Surgeon: Garald Balding, MD;  Location: [place];  Service: Orthopedics;  Laterality: Right;    There were no vitals filed for this visit.  Visit Diagnosis:  Decreased  strength  Pain in joint, shoulder region, right      Subjective Assessment - 04/17/15 1148    Subjective Doing too much at home.  Last week   Kevin Fenton MD he was happy.  New instructions for aggrressive strengthening Rt shoulder as tolerated . Needs HEP with with bands, light weights.   Currently in Pain? No/denies   Multiple Pain Sites No                         [facility] Adult PT Treatment/Exercise - 04/17/15 1151    Shoulder Exercises: Supine   Theraband Level (Shoulder External Rotation) Level 1 (Yellow)   Flexion 10 reps   Theraband Level (Shoulder Flexion) Level 1 (Yellow)  10, 2 arms pullin apart    Other Supine Exercises scaption 10 reps   Other Supine Exercises D1 active 6/10 pain   Shoulder Exercises: Seated   Flexion --  2 reps sitting very difficult   Other Seated Exercises Nu strp Level 5 6 minutes   Shoulder Exercises: Standing   Theraband Level (Shoulder External Rotation) Level 1 (Yellow)   Theraband Level (Shoulder Internal Rotation) Level 1 (Yellow)   Flexion --  flexion 7 reps, abduction 3 reps wall ladder, 120 degrees [place]   Flexion Limitations --  punch, 10 reps yellow band   Theraband Level (Shoulder Extension) Level 1 (Yellow)  row and elbows straight 10 reps each added to  home exercises   Cryotherapy   Number Minutes Cryotherapy 10 Minutes   Cryotherapy Location Shoulder   Type of Cryotherapy --  cold pack                PT Education - 04/17/15 1239    Education provided Yes   Education Details shoulder bands   Person(s) Educated Patient   Methods Explanation;Demonstration;Tactile cues;Verbal cues;Handout   Comprehension Verbalized understanding;Returned demonstration;Need further instruction          PT Short Term Goals - 04/01/15 1028    PT SHORT TERM GOAL #1   Title "Independent with initial HEP   Time 4   Period Weeks   Status Achieved   PT SHORT TERM GOAL #2   Title "Report pain decrease from  5  /10 to   3  /10.   Time 4   Period Weeks   Status Achieved   PT SHORT TERM GOAL #3   Title "Demonstrate and verbalize understanding of condition management including RICE, positioning, HEP.    Time 4   Period Weeks   Status Achieved   PT SHORT TERM GOAL #4   Title Pt caregiver will be able to correctly execute PROM for Right shoulder for pt    Status Deferred           PT Long Term Goals - 04/17/15 1329    PT LONG TERM GOAL #1   Title "Pt will be independent with advanced HEP.    Time 8   Period Weeks   Status On-going   PT LONG TERM GOAL #2   Title "Pain will decrease to 1/10 with all functional activities   Time 8   Period Weeks   Status On-going   PT LONG TERM GOAL #3   Title "R shoulder AROM scaption will improve to 0-150 degrees for improved overhead reaching.    Time 8   Status On-going   PT LONG TERM GOAL #4   Title "R shoulder AROM will return to [facility] to return to pain-free ADLs such as dressing and grooming   Time 8   Period Weeks   Status On-going   PT LONG TERM GOAL #5   Title "FOTO will improve from  55%  to  37%   indicating improved functional mobility .    Time 8   Status Unable to assess               Plan - 04/17/15 1326    Clinical Impression Statement OK to work on strengthening aggressive.  Able to begin bands for home.  2/10 pain post exercise prior to cold pack Rt shoulder.   PT Next Visit Plan review bands, gradually increase strengthening.  PT Voncille Lo instructed verbally plan for strengthening.   Consulted and Agree with Plan of Care Patient        Problem List Patient Active Problem List   Diagnosis Date Noted  . Complete tear of right rotator cuff 01/21/2015  . Severe obesity (BMI >= 40) 01/21/2015  . Nausea alone 04/04/2013  . H/O adenomatous polyp of colon 04/04/2013  . FATTY LIVER DISEASE 11/11/2010  . RUQ PAIN 11/11/2010  . NONSPEC ELEVATION OF LEVELS OF TRANSAMINASE/LDH 11/11/2010  . COLONIC POLYPS, HYPERPLASTIC, HX OF  06/25/2010  . HYPOTHYROIDISM 06/24/2010  . ANEMIA-NOS 06/24/2010    HARRIS,KAREN 04/17/2015, 1:30 PM  [facility] [address] [place], [place], [address] Phone: [phone]   Fax:  [phone]  Melvenia Needles, PTA 04/17/2015 1:30 PM Phone: [phone] Fax: [phone]

## 2015-04-17 NOTE — Patient Instructions (Signed)
Strengthening: Resisted Internal Rotation   Hold tubing in left hand, elbow at side and forearm out. Rotate forearm in across body. Repeat __101__ times per set. Do 1-2____ sets per session. Do 1____ sessions per day.  Use a small roll under arm.  http://orth.exer.us/830   Copyright  VHI. All rights reserved.  Strengthening: Resisted External Rotation   Hold tubing in right hand, elbow at side and forearm across body. Rotate forearm out. Repeat _10___ times per set. Do _1-2___ sets per session. Do _1___ sessions per day. Use a small roll under arm. http://orth.exer.us/828   Copyright  VHI. All rights reserved.  Hold tubing with Rightt arm at side.bow bentEl Punch forward . Move shoulder through pain-free range of motion. Repeat _10___ times per set. Do _1-2___ sets per session. Do _1___ sessions per day.  http://orth.exer.us/824   Copyright  VHI. All rights reserved.  Strengthening: Resisted Extension   Hold tubing in right  And Left hands, arm forward. Pull arm back, elbow straight.  Squeeze shoulder blades together.  Keep shoulders down. Repeat __10__ times per set. Do __1-2__ sets per session. Do 1____ sessions per day.  http://orth.exer.us/832   Copyright  VHI. All rights reserved.

## 2015-04-22 ENCOUNTER — Ambulatory Visit: Payer: Medicare Other | Attending: Orthopaedic Surgery | Admitting: Physical Therapy

## 2015-04-22 DIAGNOSIS — Z9889 Other specified postprocedural states: Secondary | ICD-10-CM | POA: Insufficient documentation

## 2015-04-22 DIAGNOSIS — M25511 Pain in right shoulder: Secondary | ICD-10-CM | POA: Diagnosis not present

## 2015-04-22 DIAGNOSIS — M25611 Stiffness of right shoulder, not elsewhere classified: Secondary | ICD-10-CM

## 2015-04-22 DIAGNOSIS — R531 Weakness: Secondary | ICD-10-CM

## 2015-04-22 DIAGNOSIS — M6281 Muscle weakness (generalized): Secondary | ICD-10-CM | POA: Diagnosis not present

## 2015-04-22 NOTE — Therapy (Signed)
[place], [place], [address] Phone: [phone]   Fax:  [phone]  Physical Therapy Treatment  Patient Details  Name: Cheryl [name] MRN: [id number] Date of Birth: 08/09/1946 Referring Provider:  Sharilyn Sites, MD  Encounter Date: 04/22/2015      PT End of Session - 04/22/15 1357    Visit Number 17   Date for PT Re-Evaluation 04/17/15   PT Start Time 1107   PT Stop Time 1202   PT Time Calculation (min) 55 min   Activity Tolerance Patient tolerated treatment well;Patient limited by fatigue      Past Medical History  Diagnosis Date  . Acid reflux   . Sleep apnea     had surgery to correct  . Sinus drainage   . Arthritis     Past Surgical History  Procedure Laterality Date  . Knee surgery      x2  . Rotator cuff repair      x2  . Trigger finger release    . Carpal tunnel release    . Colonoscopy  02/15/2005    [id number] small polyps ablated via cold biopsy, one from transverse colon and two from the rectum/Small external hemorrhoids  . Colonoscopy  07/10/2010    [id number] rectum/long redundant colon, polyps in the sigmoid, descending, hepatic flexure/ADENOMATOUS POLYPS. next TCS due 06/2015  . Esophagogastroduodenoscopy  1995    Gastritis  . Colonoscopy  1995    3 polyps, path revealed chronic colitis  . Esophagogastroduodenoscopy N/A 04/16/2013    Procedure: ESOPHAGOGASTRODUODENOSCOPY (EGD);  Surgeon: Daneil Dolin, MD;  [facility];  Service: Endoscopy;  Laterality: N/A;  3:30  . Uvulopalatopharyngoplasty    . Shoulder arthroscopy with rotator cuff repair and subacromial decompression Right 01/21/2015    Procedure: RIGHT SHOULDER ARTHROSCOPIC DEBRIDEMNT OF G-H JOINT AND REMOVAL OF LOOSE BODIES,ARTHROSCOPIC SUBACROMIAL DECOMPRESSION,MINI OPEN RCT REPAIR WITH SUPPLEMENTAL [facility] PATCH;  Surgeon: Garald Balding, MD;  Location: [facility];  Service: Orthopedics;  Laterality: Right;     There were no vitals filed for this visit.  Visit Diagnosis:  Decreased strength  Pain in joint, shoulder region, right  Decreased ROM of right shoulder      Subjective Assessment - 04/22/15 1132    Subjective Did things around the house vs exercise.   Currently in Pain? No/denies                         [facility] Adult PT Treatment/Exercise - 04/22/15 1133    Shoulder Exercises: Supine   Flexion 10 reps   Other Supine Exercises active bilateral arm circles both ways, horizontal adduction/abduction and flexion extension alternating.  10 reps each, some required brief rests   Shoulder Exercises: Seated   External Rotation 10 reps  yellow band small motions   Flexion --  , substitution mirror used for education   Other Seated Exercises isometric series 10 reps 5 second holds 5 directions.   Shoulder Exercises: Sidelying   External Rotation --  10 reps, ) LBS.   Shoulder Exercises: Standing   Flexion --  AROM, AAROM     Flexion Limitations 70 degrees AROM   Other Standing Exercises wall push ups 10 reps    Other Standing Exercises reach to shelf, try to lower shoulder, tandem tried, but did not decrease compensation.   Cryotherapy   Number Minutes Cryotherapy 10 Minutes   Cryotherapy Location Shoulder   Type of Cryotherapy --  cold pack                  PT Short Term Goals - 04/01/15 1028    PT SHORT TERM GOAL #1   Title "Independent with initial HEP   Time 4   Period Weeks   Status Achieved   PT SHORT TERM GOAL #2   Title "Report pain decrease from  5  /10 to   3 /10.   Time 4   Period Weeks   Status Achieved   PT SHORT TERM GOAL #3   Title "Demonstrate and verbalize understanding of condition management including RICE, positioning, HEP.    Time 4   Period Weeks   Status Achieved   PT SHORT TERM GOAL #4   Title Pt caregiver will be able to correctly execute PROM for Right shoulder for pt    Status Deferred           PT  Long Term Goals - 04/22/15 1401    PT LONG TERM GOAL #1   Title "Pt will be independent with advanced HEP.    Time 8   Period Weeks   Status On-going   PT LONG TERM GOAL #2   Title "Pain will decrease to 1/10 with all functional activities   Time 8   Period Weeks   Status On-going   PT LONG TERM GOAL #3   Title "R shoulder AROM scaption will improve to 0-150 degrees for improved overhead reaching.    Baseline 70 degrees   Time 8   Period Weeks   Status On-going   PT LONG TERM GOAL #4   Title "R shoulder AROM will return to [facility] to return to pain-free ADLs such as dressing and grooming   Baseline very limited AROM   Time 8   Period Weeks   Status On-going   PT LONG TERM GOAL #5   Title "FOTO will improve from  55%  to  37%   indicating improved functional mobility .    Time 8   Period Weeks   Status Unable to assess               Plan - 04/22/15 1358    Clinical Impression Statement Pain post exercise relieves with trigger point manual, brief to levator scap.  using compensation to move shoulder, not altered with instructions yet.   PT Next Visit Plan gradually strengthen, review bands.        Problem List Patient Active Problem List   Diagnosis Date Noted  . Complete tear of right rotator cuff 01/21/2015  . Severe obesity (BMI >= 40) 01/21/2015  . Nausea alone 04/04/2013  . H/O adenomatous polyp of colon 04/04/2013  . FATTY LIVER DISEASE 11/11/2010  . RUQ PAIN 11/11/2010  . NONSPEC ELEVATION OF LEVELS OF TRANSAMINASE/LDH 11/11/2010  . COLONIC POLYPS, HYPERPLASTIC, HX OF 06/25/2010  . HYPOTHYROIDISM 06/24/2010  . ANEMIA-NOS 06/24/2010    Shylie Polo 04/22/2015, 2:03 PM  [facility] [address] [place], [place], [address] Phone: [phone]   Fax:  [phone]  Melvenia Needles, PTA 04/22/2015 2:03 PM Phone: [phone] Fax: [phone]

## 2015-04-24 ENCOUNTER — Ambulatory Visit: Payer: Medicare Other | Admitting: Physical Therapy

## 2015-04-24 DIAGNOSIS — M25511 Pain in right shoulder: Secondary | ICD-10-CM

## 2015-04-24 DIAGNOSIS — Z9889 Other specified postprocedural states: Secondary | ICD-10-CM | POA: Diagnosis not present

## 2015-04-24 DIAGNOSIS — R531 Weakness: Secondary | ICD-10-CM

## 2015-04-24 DIAGNOSIS — M25611 Stiffness of right shoulder, not elsewhere classified: Secondary | ICD-10-CM

## 2015-04-24 DIAGNOSIS — M6281 Muscle weakness (generalized): Secondary | ICD-10-CM | POA: Diagnosis not present

## 2015-04-24 NOTE — Therapy (Signed)
[place], [place], [address] Phone: [phone]   Fax:  [phone]  Physical Therapy Treatment/Reevaluation  Patient Details  Name: Cheryl [name] MRN: [id number] Date of Birth: [birth year] Referring Provider:  Sharilyn Sites, MD  Encounter Date: 04/24/2015      PT End of Session - 04/24/15 1215    Visit Number 18   Number of Visits 32   Date for PT Re-Evaluation 06/19/15   PT Start Time 1016   PT Stop Time 1120   PT Time Calculation (min) 64 min   Activity Tolerance Patient tolerated treatment well   Behavior During Therapy [facility] for tasks assessed/performed      Past Medical History  Diagnosis Date  . Acid reflux   . Sleep apnea     had surgery to correct  . Sinus drainage   . Arthritis     Past Surgical History  Procedure Laterality Date  . Knee surgery      x2  . Rotator cuff repair      x2  . Trigger finger release    . Carpal tunnel release    . Colonoscopy  02/15/2005    [id number] small polyps ablated via cold biopsy, one from transverse colon and two from the rectum/Small external hemorrhoids  . Colonoscopy  07/10/2010    [id number] rectum/long redundant colon, polyps in the sigmoid, descending, hepatic flexure/ADENOMATOUS POLYPS. next TCS due 06/2015  . Esophagogastroduodenoscopy  1995    Gastritis  . Colonoscopy  1995    3 polyps, path revealed chronic colitis  . Esophagogastroduodenoscopy N/A 04/16/2013    Procedure: ESOPHAGOGASTRODUODENOSCOPY (EGD);  Surgeon: Daneil Dolin, MD;  [facility];  Service: Endoscopy;  Laterality: N/A;  3:30  . Uvulopalatopharyngoplasty    . Shoulder arthroscopy with rotator cuff repair and subacromial decompression Right 01/21/2015    Procedure: RIGHT SHOULDER ARTHROSCOPIC DEBRIDEMNT OF G-H JOINT AND REMOVAL OF LOOSE BODIES,ARTHROSCOPIC SUBACROMIAL DECOMPRESSION,MINI OPEN RCT REPAIR WITH SUPPLEMENTAL [facility] PATCH;  Surgeon: Garald Balding, MD;  Location: [facility];  Service: Orthopedics;  Laterality: Right;    There were no vitals filed for this visit.  Visit Diagnosis:  Decreased strength  Pain in joint, shoulder region, right  Decreased ROM of right shoulder  S/P arthroscopy of shoulder  S/P rotator cuff repair      Subjective Assessment - 04/24/15 1217    Subjective I am about a 2 /10 today.  I am able to move my arm better, but I am hurting today.  Maybe the weather   Pertinent History Pt has sleep apnea, acid reflux    How long can you sit comfortably? unlimited   Patient Stated Goals be able to use my arm for ADLs and household chores   Currently in Pain? Yes   Pain Score 2    Pain Location Neck   Pain Orientation Right   Pain Descriptors / Indicators Sore;Spasm;Tightness   Pain Type Chronic pain   Pain Onset More than a month ago   Aggravating Factors  cold weather   Multiple Pain Sites Yes   Pain Score 2   Pain Type Chronic pain   Pain Location Shoulder   Pain Orientation Right   Pain Descriptors / Indicators Aching;Sore;Tightness   Pain Frequency Intermittent            [facility] PT Assessment - 04/24/15 1022    Observation/Other Assessments   Focus on Therapeutic Outcomes (FOTO)  intake 52%, limitation 48%  predicted 37%   AROM   Right Shoulder Flexion 85 Degrees  pt starts compensating with elevated trap   Right Shoulder ABduction 68 Degrees  max AROM without upper trap elevation   Right Shoulder Internal Rotation 46 Degrees  end range discomfort    Right Shoulder External Rotation 50 Degrees  end range discomfort.   PROM   Right Shoulder Flexion 150 Degrees  ERP all measurements in supine     Right Shoulder ABduction 132 Degrees   Right Shoulder Internal Rotation 55 Degrees   Right Shoulder External Rotation 60 Degrees   Left Shoulder Flexion 160 Degrees   Left Shoulder ABduction 160 Degrees   Left Shoulder Internal Rotation 80 Degrees   Left Shoulder External Rotation 58  Degrees   Strength   Right Shoulder Flexion 3-/5   Right Shoulder ABduction 3-/5   Right Shoulder Internal Rotation 3-/5   Right Shoulder External Rotation 3-/5   Palpation   Palpation Pt with stiff Right scapular/decreased mobiltity and tenderness over Right upper trap/levator and supraspinatus                     [facility] Adult PT Treatment/Exercise - 04/24/15 1015    Shoulder Exercises: Supine   External Rotation Both;15 reps;10 reps  x2 first rep 15   Theraband Level (Shoulder External Rotation) Level 1 (Yellow)   External Rotation Limitations pt given towel roll for more effective exercise   Flexion 10 reps   Other Supine Exercises active bilateral arm circles both ways, horizontal adduction/abduction and flexion extension alternating.  10 reps each, some required brief rests   Shoulder Exercises: Seated   External Rotation 10 reps  1st 15 x    Flexion --  , substitution mirror used for education   Other Seated Exercises isometric series 10 reps 5 second holds 5 directions.   Shoulder Exercises: Sidelying   External Rotation --  10 reps, ) LBS.   Shoulder Exercises: Standing   Horizontal ABduction Strengthening;10 reps;Theraband  x2 with VC   Theraband Level (Shoulder Horizontal ABduction) Level 1 (Yellow)   External Rotation 10 reps  x2   Theraband Level (Shoulder External Rotation) Level 1 (Yellow)   Internal Rotation Strengthening;10 reps  x2 with complete rest between   Theraband Level (Shoulder Internal Rotation) Level 1 (Yellow)   Flexion --  AROM, AAROM     Theraband Level (Shoulder Flexion) Level 1 (Yellow)   Flexion Limitations 70 degrees AROM   Row Both;10 reps  x2   Other Standing Exercises D1/D2 wit yellow tband and PT holding and VC 10 x   Other Standing Exercises --   Cryotherapy   Number Minutes Cryotherapy 12 Minutes   Cryotherapy Location Shoulder   Type of Cryotherapy --  cold pack   Manual Therapy   Manual Therapy Scapular  mobilization;Joint mobilization   Joint Mobilization Grade 2 oscillation for IR and long arm distraction   Scapular Mobilization Grade 2 left sidelying, scapular mob   Other Manual Therapy isometric for shoulder ext, flex abd and add in various ranges                  PT Short Term Goals - 04/01/15 1028    PT SHORT TERM GOAL #1   Title "Independent with initial HEP   Time 4   Period Weeks   Status Achieved   PT SHORT TERM GOAL #2   Title "Report pain decrease from  5  /10 to   3 /  10.   Time 4   Period Weeks   Status Achieved   PT SHORT TERM GOAL #3   Title "Demonstrate and verbalize understanding of condition management including RICE, positioning, HEP.    Time 4   Period Weeks   Status Achieved   PT SHORT TERM GOAL #4   Title Pt caregiver will be able to correctly execute PROM for Right shoulder for pt    Status Deferred           PT Long Term Goals - 04/24/15 1020    PT LONG TERM GOAL #1   Title "Pt will be independent with advanced HEP.    Time 16   Period Weeks   Status On-going   PT LONG TERM GOAL #2   Title "Pain will decrease to 1/10 with all functional activities  pt now able to do active exercise per protocol   Time 16   Period Weeks   Status On-going   PT LONG TERM GOAL #3   Title "R shoulder AROM scaption will improve to 0-120  degrees for improved overhead reaching to reach dishes and retrieve items from freezer.   Time 16   Period Weeks   Status Revised   PT LONG TERM GOAL #4   Title "R shoulder AROM will return to [facility] to return to pain-free ADLs such as dressing and grooming  Pt able to dress self and decreased pain, some difficulty with bathing in shower and reaching under opposite arm   Time 16   Period Weeks   Status On-going   PT LONG TERM GOAL #5   Title "FOTO will improve from  55%  to  37%   indicating improved functional mobility .    Baseline limitation 48%   Time 16   Period Weeks   Status On-going   Additional Long Term  Goals   Additional Long Term Goals Yes   PT LONG TERM GOAL #6   Title Pt will be able to drive without exacerbating pain and turn steering wheel for turns   Time 16   Period Weeks   Status Revised   PT LONG TERM GOAL #7   Title Pt will increase AROM of Right shoulder in order to style hair independently   Time 16   Period Weeks   Status New               Plan - 04/24/15 1216    Clinical Impression Statement Pt with extensive RTC on 02-04-15 with PROM/AAROMfor most of treatment due to protectiion of surgery.  Pt now able to participate with AROM exercise.  Pt with 2/10 pain today after beginning AROM.  Pt now assessed for AROM measurements.  See AROM assessment in note.  Pt  with 3-/5  strength in R shoulder and tends to compensate with upper trap/levator involvement.  Will continue protocol  towards maximizing function.  Pt still not able to reach for opposite underarm for ADL's and not able to style hair nor reach above shoulder level to retrieve kitchen items.  Pt will beniefit from continued skilled PT to maximized function.  Pt may also benefit from trigger point dry needling to upper trap and levator for muscle lengthening and relaxation as well as decreased in pain..   Pt will benefit from skilled therapeutic intervention in order to improve on the following deficits Decreased activity tolerance;Decreased scar mobility;Decreased range of motion;Increased edema;Increased fascial restricitons;Obesity;Pain;Postural dysfunction;Improper body mechanics;Impaired flexibility   Rehab Potential Good   PT  Frequency 2x / week   PT Duration 8 weeks   PT Treatment/Interventions ADLs/Self Care Home Management;Cryotherapy;Electrical Stimulation;Ultrasound;Moist Heat;Functional mobility training;Neuromuscular re-education;Therapeutic exercise;Therapeutic activities;Patient/family education;Manual techniques;Dry needling;Passive range of motion;Scar mobilization   PT Next Visit Plan continue  protocol for maximizing function, scapular mobiilzation and RANGER    Consulted and Agree with Plan of Care Patient          G-Codes - 2015/05/05 03-24-28    Functional Assessment Tool Used FOTO   Functional Limitation Carrying, moving and handling objects   Carrying, Moving and Handling Objects Current Status [phone]) At least 40 percent but less than 60 percent impaired, limited or restricted  48%   Carrying, Moving and Handling Objects Goal Status [id number]) At least 20 percent but less than 40 percent impaired, limited or restricted      Problem List Patient Active Problem List   Diagnosis Date Noted  . Complete tear of right rotator cuff 01/21/2015  . Severe obesity (BMI >= 40) 01/21/2015  . Nausea alone 04/04/2013  . H/O adenomatous polyp of colon 04/04/2013  . FATTY LIVER DISEASE 11/11/2010  . RUQ PAIN 11/11/2010  . NONSPEC ELEVATION OF LEVELS OF TRANSAMINASE/LDH 11/11/2010  . COLONIC POLYPS, HYPERPLASTIC, HX OF 06/25/2010  . HYPOTHYROIDISM 06/24/2010  . ANEMIA-NOS 06/24/2010   Voncille Lo, PT May 05, 2015 12:35 PM Phone: [phone] Fax: [facility] [place], [place], [address] Phone: [phone]   Fax:  [phone]

## 2015-04-28 ENCOUNTER — Ambulatory Visit: Payer: Medicare Other | Admitting: Physical Therapy

## 2015-04-28 DIAGNOSIS — M25611 Stiffness of right shoulder, not elsewhere classified: Secondary | ICD-10-CM

## 2015-04-28 DIAGNOSIS — M6281 Muscle weakness (generalized): Secondary | ICD-10-CM | POA: Diagnosis not present

## 2015-04-28 DIAGNOSIS — R531 Weakness: Secondary | ICD-10-CM

## 2015-04-28 DIAGNOSIS — M25511 Pain in right shoulder: Secondary | ICD-10-CM | POA: Diagnosis not present

## 2015-04-28 DIAGNOSIS — Z9889 Other specified postprocedural states: Secondary | ICD-10-CM | POA: Diagnosis not present

## 2015-04-28 NOTE — Therapy (Signed)
[place], [place], [address] Phone: [phone]   Fax:  [phone]  Physical Therapy Treatment  Patient Details  Name: Cheryl [name] MRN: [id number] Date of Birth: 06-06-1946 Referring Provider:  Sharilyn Sites, MD  Encounter Date: 04/28/2015      PT End of Session - 04/28/15 1215    PT Start Time 0732   PT Stop Time 0814   PT Time Calculation (min) 42 min   Activity Tolerance Patient tolerated treatment well      Past Medical History  Diagnosis Date  . Acid reflux   . Sleep apnea     had surgery to correct  . Sinus drainage   . Arthritis     Past Surgical History  Procedure Laterality Date  . Knee surgery      x2  . Rotator cuff repair      x2  . Trigger finger release    . Carpal tunnel release    . Colonoscopy  02/15/2005    [id number] small polyps ablated via cold biopsy, one from transverse colon and two from the rectum/Small external hemorrhoids  . Colonoscopy  07/10/2010    [id number] rectum/long redundant colon, polyps in the sigmoid, descending, hepatic flexure/ADENOMATOUS POLYPS. next TCS due 06/2015  . Esophagogastroduodenoscopy  1995    Gastritis  . Colonoscopy  1995    3 polyps, path revealed chronic colitis  . Esophagogastroduodenoscopy N/A 04/16/2013    Procedure: ESOPHAGOGASTRODUODENOSCOPY (EGD);  Surgeon: Daneil Dolin, MD;  [facility];  Service: Endoscopy;  Laterality: N/A;  3:30  . Uvulopalatopharyngoplasty    . Shoulder arthroscopy with rotator cuff repair and subacromial decompression Right 01/21/2015    Procedure: RIGHT SHOULDER ARTHROSCOPIC DEBRIDEMNT OF G-H JOINT AND REMOVAL OF LOOSE BODIES,ARTHROSCOPIC SUBACROMIAL DECOMPRESSION,MINI OPEN RCT REPAIR WITH SUPPLEMENTAL [facility] PATCH;  Surgeon: Garald Balding, MD;  Location: [facility];  Service: Orthopedics;  Laterality: Right;    There were no vitals filed for this visit.  Visit Diagnosis:  Decreased  strength  Pain in joint, shoulder region, right  Decreased ROM of right shoulder      Subjective Assessment - 04/28/15 0744    Subjective not pain  just discomfort   Currently in Pain? No/denies   Pain Location Shoulder   Pain Orientation Right   Pain Descriptors / Indicators --  Feels like it needs WD40   Aggravating Factors  cold   Multiple Pain Sites No                         [facility] Adult PT Treatment/Exercise - 04/28/15 0745    Shoulder Exercises: Supine   Theraband Level (Shoulder Flexion) Level 1 (Yellow)  both pulling band 10 reps   Flexion Limitations 150 degrees   Shoulder Exercises: Standing   Horizontal ABduction --  RANGER.10 reps horizontal, cued for technique   Flexion --  RANGER used helpful, manual during motion   Row Both;10 reps  x2   Other Standing Exercises Many exercises UE Ranger   Cryotherapy   Number Minutes Cryotherapy 10 Minutes   Cryotherapy Location Shoulder   Type of Cryotherapy --  cold pack   Manual Therapy   Manual Therapy Scapular mobilization  soft tissue peri scapula and upper trap, tender, softened.   Other Manual Therapy --  soft tissue, upper trap                PT Education - 04/28/15  1214    Education provided Yes   Education Details DN info   Person(s) Educated Patient   Methods Handout;Explanation          PT Short Term Goals - 04/28/15 1218    PT SHORT TERM GOAL #1   Title "Independent with initial HEP   Status Achieved   PT SHORT TERM GOAL #2   Title "Report pain decrease from  5  /10 to   3 /10.   Status Achieved   PT SHORT TERM GOAL #3   Title "Demonstrate and verbalize understanding of condition management including RICE, positioning, HEP.    Status Achieved   PT SHORT TERM GOAL #4   Status Deferred           PT Long Term Goals - 04/28/15 1218    PT LONG TERM GOAL #1   Title "Pt will be independent with advanced HEP.    Time 16   Period Weeks   Status On-going   PT  LONG TERM GOAL #2   Title "Pain will decrease to 1/10 with all functional activities   Time 16   Period Weeks   Status On-going   PT LONG TERM GOAL #3   Time 16   Period Weeks   Status On-going   PT LONG TERM GOAL #4   Title "R shoulder AROM will return to [facility] to return to pain-free ADLs such as dressing and grooming   Time 16   Status On-going   PT LONG TERM GOAL #5   Title "FOTO will improve from  55%  to  37%   indicating improved functional mobility .    Baseline limitation 48%   Time 16   Period Weeks   Status On-going   PT LONG TERM GOAL #6   Time 16   Period Weeks   Status On-going   PT LONG TERM GOAL #7   Title Pt will increase AROM of Right shoulder in order to style hair independently   Time 16   Period Weeks   Status On-going               Plan - 04/28/15 1216    Clinical Impression Statement Mauual helpful, RANGER helpful for supported exercise with less substitution.  Strengthening continued.   PT Next Visit Plan continue protocol for maximizing function, scapular mobiilzation and RANGER    Consulted and Agree with Plan of Care Patient        Problem List Patient Active Problem List   Diagnosis Date Noted  . Complete tear of right rotator cuff 01/21/2015  . Severe obesity (BMI >= 40) 01/21/2015  . Nausea alone 04/04/2013  . H/O adenomatous polyp of colon 04/04/2013  . FATTY LIVER DISEASE 11/11/2010  . RUQ PAIN 11/11/2010  . NONSPEC ELEVATION OF LEVELS OF TRANSAMINASE/LDH 11/11/2010  . COLONIC POLYPS, HYPERPLASTIC, HX OF 06/25/2010  . HYPOTHYROIDISM 06/24/2010  . ANEMIA-NOS 06/24/2010    HARRIS,KAREN 04/28/2015, 12:21 PM  [facility] [address] [place], [place], [address] Phone: [phone]   Fax:  [phone]  Melvenia Needles, PTA 04/28/2015 12:21 PM Phone: [phone] Fax: [phone]

## 2015-04-29 ENCOUNTER — Ambulatory Visit: Payer: Medicare Other | Admitting: Physical Therapy

## 2015-04-29 DIAGNOSIS — Z9889 Other specified postprocedural states: Secondary | ICD-10-CM | POA: Diagnosis not present

## 2015-04-29 DIAGNOSIS — M25511 Pain in right shoulder: Secondary | ICD-10-CM

## 2015-04-29 DIAGNOSIS — M6281 Muscle weakness (generalized): Secondary | ICD-10-CM | POA: Diagnosis not present

## 2015-04-29 DIAGNOSIS — M25611 Stiffness of right shoulder, not elsewhere classified: Secondary | ICD-10-CM

## 2015-04-29 DIAGNOSIS — R531 Weakness: Secondary | ICD-10-CM

## 2015-04-29 NOTE — Therapy (Signed)
[place], [place], [address] Phone: [phone]   Fax:  [phone]  Physical Therapy Treatment  Patient Details  Name: Cheryl [name] MRN: [id number] Date of Birth: 13-Apr-1946 Referring Provider:  Sharilyn Sites, MD  Encounter Date: 04/29/2015      PT End of Session - 04/29/15 1820    Visit Number 20   Number of Visits 32   Date for PT Re-Evaluation 06/19/15   PT Start Time 0731   PT Stop Time 0817   PT Time Calculation (min) 46 min   Activity Tolerance Patient tolerated treatment well      Past Medical History  Diagnosis Date  . Acid reflux   . Sleep apnea     had surgery to correct  . Sinus drainage   . Arthritis     Past Surgical History  Procedure Laterality Date  . Knee surgery      x2  . Rotator cuff repair      x2  . Trigger finger release    . Carpal tunnel release    . Colonoscopy  02/15/2005    [id number] small polyps ablated via cold biopsy, one from transverse colon and two from the rectum/Small external hemorrhoids  . Colonoscopy  07/10/2010    [id number] rectum/long redundant colon, polyps in the sigmoid, descending, hepatic flexure/ADENOMATOUS POLYPS. next TCS due 06/2015  . Esophagogastroduodenoscopy  1995    Gastritis  . Colonoscopy  1995    3 polyps, path revealed chronic colitis  . Esophagogastroduodenoscopy N/A 04/16/2013    Procedure: ESOPHAGOGASTRODUODENOSCOPY (EGD);  Surgeon: Daneil Dolin, MD;  [facility];  Service: Endoscopy;  Laterality: N/A;  3:30  . Uvulopalatopharyngoplasty    . Shoulder arthroscopy with rotator cuff repair and subacromial decompression Right 01/21/2015    Procedure: RIGHT SHOULDER ARTHROSCOPIC DEBRIDEMNT OF G-H JOINT AND REMOVAL OF LOOSE BODIES,ARTHROSCOPIC SUBACROMIAL DECOMPRESSION,MINI OPEN RCT REPAIR WITH SUPPLEMENTAL [facility] PATCH;  Surgeon: Garald Balding, MD;  Location: [place];  Service: Orthopedics;  Laterality: Right;     There were no vitals filed for this visit.  Visit Diagnosis:  Decreased strength  Pain in joint, shoulder region, right  Decreased ROM of right shoulder                       [facility] Adult PT Treatment/Exercise - 04/29/15 0735    Shoulder Exercises: Standing   External Rotation 10 reps  both   Theraband Level (Shoulder External Rotation) Level 2 (Red)   Flexion --  RANGER used helpful, manual during motion   Row Both;10 reps  x2   Other Standing Exercises Many exercises UE Ranger   Cryotherapy   Number Minutes Cryotherapy 20 Minutes   Cryotherapy Location Shoulder   Type of Cryotherapy --  cold pack   Electrical Stimulation   Electrical Stimulation Location neck, upper trap   Electrical Stimulation Action IFC   Electrical Stimulation Parameters to tolerance   Electrical Stimulation Goals Pain   Manual Therapy   Manual Therapy Scapular mobilization  soft tissue peri scapula and upper trap, tender, softened.                PT Education - 04/28/15 1214    Education provided Yes   Education Details DN info   Person(s) Educated Patient   Methods Handout;Explanation          PT Short Term Goals - 04/28/15 1218    PT SHORT TERM  GOAL #1   Title "Independent with initial HEP   Status Achieved   PT SHORT TERM GOAL #2   Title "Report pain decrease from  5  /10 to   3 /10.   Status Achieved   PT SHORT TERM GOAL #3   Title "Demonstrate and verbalize understanding of condition management including RICE, positioning, HEP.    Status Achieved   PT SHORT TERM GOAL #4   Status Deferred           PT Long Term Goals - 04/28/15 1218    PT LONG TERM GOAL #1   Title "Pt will be independent with advanced HEP.    Time 16   Period Weeks   Status On-going   PT LONG TERM GOAL #2   Title "Pain will decrease to 1/10 with all functional activities   Time 16   Period Weeks   Status On-going   PT LONG TERM GOAL #3   Time 16   Period Weeks    Status On-going   PT LONG TERM GOAL #4   Title "R shoulder AROM will return to [facility] to return to pain-free ADLs such as dressing and grooming   Time 16   Status On-going   PT LONG TERM GOAL #5   Title "FOTO will improve from  55%  to  37%   indicating improved functional mobility .    Baseline limitation 48%   Time 16   Period Weeks   Status On-going   PT LONG TERM GOAL #6   Time 16   Period Weeks   Status On-going   PT LONG TERM GOAL #7   Title Pt will increase AROM of Right shoulder in order to style hair independently   Time 16   Period Weeks   Status On-going               Plan - 04/29/15 1821    Clinical Impression Statement Continue scapular stabilization for increased function,  Upper trap . levator areas sore tight, modalities helpful.  Patient has decided DN will be helpful.   PT Next Visit Plan continue protocol for maximizing function, scapular mobiilzation and RANGER , DN soon as can be set up.   Consulted and Agree with Plan of Care Patient        Problem List Patient Active Problem List   Diagnosis Date Noted  . Complete tear of right rotator cuff 01/21/2015  . Severe obesity (BMI >= 40) 01/21/2015  . Nausea alone 04/04/2013  . H/O adenomatous polyp of colon 04/04/2013  . FATTY LIVER DISEASE 11/11/2010  . RUQ PAIN 11/11/2010  . NONSPEC ELEVATION OF LEVELS OF TRANSAMINASE/LDH 11/11/2010  . COLONIC POLYPS, HYPERPLASTIC, HX OF 06/25/2010  . HYPOTHYROIDISM 06/24/2010  . ANEMIA-NOS 06/24/2010    HARRIS,KAREN 04/29/2015, 6:24 PM  [facility] [address] [place], [place], [address] Phone: [phone]   Fax:  [phone]  Melvenia Needles, PTA 04/29/2015 6:24 PM Phone: [phone] Fax: [phone]

## 2015-05-04 ENCOUNTER — Emergency Department (HOSPITAL_COMMUNITY)
Admission: EM | Admit: 2015-05-04 | Discharge: 2015-05-04 | Disposition: A | Discharge status: Home / Self Care | Payer: Medicare Other | Attending: Emergency Medicine | Admitting: Emergency Medicine

## 2015-05-04 ENCOUNTER — Emergency Department (HOSPITAL_COMMUNITY): Payer: Medicare Other

## 2015-05-04 ENCOUNTER — Encounter (HOSPITAL_COMMUNITY): Payer: Self-pay | Admitting: Emergency Medicine

## 2015-05-04 DIAGNOSIS — Z8669 Personal history of other diseases of the nervous system and sense organs: Secondary | ICD-10-CM | POA: Diagnosis not present

## 2015-05-04 DIAGNOSIS — Z79899 Other long term (current) drug therapy: Secondary | ICD-10-CM | POA: Insufficient documentation

## 2015-05-04 DIAGNOSIS — K219 Gastro-esophageal reflux disease without esophagitis: Secondary | ICD-10-CM | POA: Insufficient documentation

## 2015-05-04 DIAGNOSIS — M25474 Effusion, right foot: Secondary | ICD-10-CM | POA: Diagnosis not present

## 2015-05-04 DIAGNOSIS — M1990 Unspecified osteoarthritis, unspecified site: Secondary | ICD-10-CM | POA: Insufficient documentation

## 2015-05-04 DIAGNOSIS — M79671 Pain in right foot: Secondary | ICD-10-CM | POA: Diagnosis not present

## 2015-05-04 DIAGNOSIS — Z87891 Personal history of nicotine dependence: Secondary | ICD-10-CM | POA: Insufficient documentation

## 2015-05-04 DIAGNOSIS — R609 Edema, unspecified: Secondary | ICD-10-CM

## 2015-05-04 DIAGNOSIS — M7989 Other specified soft tissue disorders: Secondary | ICD-10-CM | POA: Diagnosis not present

## 2015-05-04 DIAGNOSIS — M25571 Pain in right ankle and joints of right foot: Secondary | ICD-10-CM | POA: Diagnosis not present

## 2015-05-04 DIAGNOSIS — Z8709 Personal history of other diseases of the respiratory system: Secondary | ICD-10-CM | POA: Insufficient documentation

## 2015-05-04 DIAGNOSIS — Z7951 Long term (current) use of inhaled steroids: Secondary | ICD-10-CM | POA: Diagnosis not present

## 2015-05-04 MED ORDER — TRAMADOL HCL 50 MG PO TABS: 50.0000 mg | ORAL_TABLET | Freq: Four times a day (QID) | ORAL | Status: DC | PRN

## 2015-05-04 MED ORDER — ONDANSETRON 4 MG PO TBDP: 4.0000 mg | ORAL_TABLET | Freq: Three times a day (TID) | ORAL | Status: DC | PRN

## 2015-05-04 MED ORDER — OXYCODONE-ACETAMINOPHEN 5-325 MG PO TABS: 1.0000 | ORAL_TABLET | ORAL | Status: DC | PRN

## 2015-05-04 MED ORDER — AMOXICILLIN-POT CLAVULANATE 875-125 MG PO TABS: 1.0000 | ORAL_TABLET | Freq: Two times a day (BID) | ORAL | Status: DC

## 2015-05-04 NOTE — Discharge Instructions (Signed)
Your xray is normal - this may be an early infection in your foot - please take Augmentin twice daily   Please call your doctor for a followup appointment within 24-48 hours. When you talk to your doctor please let them know that you were seen in the emergency department and have them acquire all of your records so that they can discuss the findings with you and formulate a treatment plan to fully care for your new and ongoing problems.

## 2015-05-04 NOTE — ED Notes (Signed)
Pt. Stated, Foot pain since yesterday, no injury

## 2015-05-04 NOTE — ED Provider Notes (Signed)
CSN: [id number]     Arrival date & time 05/04/15  0750 History   First MD Initiated Contact with Patient 05/04/15 [phone]     Chief Complaint  Patient presents with  . Foot Pain     (Consider location/radiation/quality/duration/timing/severity/associated sxs/prior Treatment) HPI Comments: The pt has had pain in the R foot that started yesterday - awoke with pain at that time - this has been constant, she has mild swelling with it but no fevers, no vomiting and no SOB / CP / HA or weakness.  The pain is constant, worse with walking, no known injuries, no hx of gout or diabetes.  No meds pta for pain.  Patient is a 69 y.o. female presenting with lower extremity pain. The history is provided by the patient.  Foot Pain    Past Medical History  Diagnosis Date  . Acid reflux   . Sleep apnea     had surgery to correct  . Sinus drainage   . Arthritis    Past Surgical History  Procedure Laterality Date  . Knee surgery      x2  . Rotator cuff repair      x2  . Trigger finger release    . Carpal tunnel release    . Colonoscopy  02/15/2005    [id number] small polyps ablated via cold biopsy, one from transverse colon and two from the rectum/Small external hemorrhoids  . Colonoscopy  07/10/2010    [id number] rectum/long redundant colon, polyps in the sigmoid, descending, hepatic flexure/ADENOMATOUS POLYPS. next TCS due 06/2015  . Esophagogastroduodenoscopy  1995    Gastritis  . Colonoscopy  1995    3 polyps, path revealed chronic colitis  . Esophagogastroduodenoscopy N/A 04/16/2013    Procedure: ESOPHAGOGASTRODUODENOSCOPY (EGD);  Surgeon: Daneil Dolin, MD;  [facility];  Service: Endoscopy;  Laterality: N/A;  3:30  . Uvulopalatopharyngoplasty    . Shoulder arthroscopy with rotator cuff repair and subacromial decompression Right 01/21/2015    Procedure: RIGHT SHOULDER ARTHROSCOPIC DEBRIDEMNT OF G-H JOINT AND REMOVAL OF LOOSE BODIES,ARTHROSCOPIC SUBACROMIAL DECOMPRESSION,MINI OPEN  RCT REPAIR WITH SUPPLEMENTAL [facility] PATCH;  Surgeon: Garald Balding, MD;  Location: [place];  Service: Orthopedics;  Laterality: Right;   Family History  Problem Relation Age of Onset  . Cancer Mother   . Cancer Father   . Diabetes Sister   . Colon cancer Neg Hx   . Liver disease Neg Hx    History  Substance Use Topics  . Smoking status: Former Smoker -- 48 years    Types: Cigarettes  . Smokeless tobacco: Former Systems developer    Quit date: 12/20/2004  . Alcohol Use: No   OB History    No data available     Review of Systems  Constitutional: Negative for fever.  Musculoskeletal: Positive for joint swelling.      Allergies  Prednisone; Tramadol; Aspirin; and Celecoxib  Home Medications   Prior to Admission medications   Medication Sig Start Date End Date Taking? Authorizing Provider  albuterol (PROVENTIL HFA;VENTOLIN HFA) 108 (90 BASE) MCG/ACT inhaler Inhale 1 puff into the lungs every 6 (six) hours as needed for wheezing or shortness of breath.   Yes Historical Provider, MD  Biotin 1000 MCG tablet Take 1,000 mcg by mouth every morning.    Yes Historical Provider, MD  CALCIUM-VITAMIN D PO Take 1 tablet by mouth every morning.   Yes Historical Provider, MD  Cyanocobalamin (VITAMIN B-12 PO) Take 1 tablet by mouth every morning.  Yes Historical Provider, MD  fluticasone (FLONASE) 50 MCG/ACT nasal spray Place 1 spray into both nostrils daily as needed for allergies (sinuses).   Yes Historical Provider, MD  montelukast (SINGULAIR) 10 MG tablet Take 10 mg by mouth every morning.   Yes Historical Provider, MD  naproxen sodium (ALEVE) 220 MG tablet Take 440 mg by mouth 2 (two) times daily as needed (pain).    Yes Historical Provider, MD  oxyCODONE-acetaminophen (ROXICET) 5-325 MG per tablet Take 1-2 tablets by mouth every 4 (four) hours as needed for moderate pain or severe pain. 01/21/15  Yes Mike Craze Petrarca, PA-C  pantoprazole (PROTONIX) 40 MG tablet Take 1 tablet (40 mg total) by  mouth daily. 01/22/15  Yes Carlis Stable, NP  raloxifene (EVISTA) 60 MG tablet Take 60 mg by mouth daily.   Yes Historical Provider, MD  amoxicillin-clavulanate (AUGMENTIN) 875-125 MG per tablet Take 1 tablet by mouth every 12 (twelve) hours. 05/04/15   Noemi Chapel, MD  ondansetron (ZOFRAN ODT) 4 MG disintegrating tablet Take 1 tablet (4 mg total) by mouth every 8 (eight) hours as needed for nausea. 05/04/15   Noemi Chapel, MD  pantoprazole (PROTONIX) 40 MG tablet Take 1 tablet (40 mg total) by mouth daily. 02/21/15   Orvil Feil, NP  traMADol (ULTRAM) 50 MG tablet Take 1 tablet (50 mg total) by mouth every 6 (six) hours as needed. 05/04/15   Noemi Chapel, MD   BP 117/51 mmHg  Pulse 75  Temp(Src) 98 F (36.7 C) (Oral)  Resp 18  Ht 4\' 11"  (1.499 m)  Wt 235 lb (106.595 kg)  BMI 47.44 kg/m2  SpO2 95% Physical Exam  Constitutional: She appears well-developed and well-nourished.  HENT:  Head: Normocephalic and atraumatic.  Eyes: Conjunctivae are normal. Right eye exhibits no discharge. Left eye exhibits no discharge.  Pulmonary/Chest: Effort normal. No respiratory distress.  Musculoskeletal: She exhibits tenderness ( mild ttp over the plantar aspect of hte R foot laterally - mild redness, no ttp over the dorsum of the foot, normal ROM of the ankle.).  Neurological: She is alert. Coordination normal.  Skin: Skin is warm and dry. No rash noted. She is not diaphoretic. No erythema.  Psychiatric: She has a normal mood and affect.  Nursing note and vitals reviewed.   ED Course  Procedures (including critical care time) Labs Review Labs Reviewed - No data to display  Imaging Review Dg Foot Complete Right  05/04/2015   CLINICAL DATA:  Right foot swelling for 2 days  EXAM: RIGHT FOOT COMPLETE - 3+ VIEW  COMPARISON:  09/10/2008  FINDINGS: No acute fracture. No dislocation. Soft tissue swelling about the foot is noted.  IMPRESSION: No acute bony pathology.   Electronically Signed   By: Marybelle Killings M.D.    On: 05/04/2015 08:50      MDM   Final diagnoses:  Swelling  Foot pain, right    Mild redness, and ttp on the bottom of the R foot - possible early infection / FB / frx, xray.  Meds given in ED:  Medications - No data to display  New Prescriptions   AMOXICILLIN-CLAVULANATE (AUGMENTIN) 875-125 MG PER TABLET    Take 1 tablet by mouth every 12 (twelve) hours.   ONDANSETRON (ZOFRAN ODT) 4 MG DISINTEGRATING TABLET    Take 1 tablet (4 mg total) by mouth every 8 (eight) hours as needed for nausea.   TRAMADOL (ULTRAM) 50 MG TABLET    Take 1 tablet (50 mg total) by  mouth every 6 (six) hours as needed.      Noemi Chapel, MD 05/04/15 [phone]

## 2015-05-06 ENCOUNTER — Ambulatory Visit: Payer: Medicare Other | Admitting: Physical Therapy

## 2015-05-06 DIAGNOSIS — Z9889 Other specified postprocedural states: Secondary | ICD-10-CM

## 2015-05-06 DIAGNOSIS — M25511 Pain in right shoulder: Secondary | ICD-10-CM | POA: Diagnosis not present

## 2015-05-06 DIAGNOSIS — R531 Weakness: Secondary | ICD-10-CM

## 2015-05-06 DIAGNOSIS — M25611 Stiffness of right shoulder, not elsewhere classified: Secondary | ICD-10-CM

## 2015-05-06 DIAGNOSIS — M6281 Muscle weakness (generalized): Secondary | ICD-10-CM | POA: Diagnosis not present

## 2015-05-06 NOTE — Patient Instructions (Signed)

## 2015-05-06 NOTE — Therapy (Signed)
[place], [place], [address] Phone: [phone]   Fax:  [phone]  Physical Therapy Treatment  Patient Details  Name: MALAY DAVIDOV MRN: [id number] Date of Birth: [birth year] Referring Provider:  Sharilyn Sites, MD  Encounter Date: 05/06/2015      PT End of Session - 05/06/15 1524    Visit Number 21   Number of Visits 32   Date for PT Re-Evaluation 06/19/15   PT Start Time [id number]   PT Stop Time [id number]   PT Time Calculation (min) 53 min   Activity Tolerance Patient tolerated treatment well      Past Medical History  Diagnosis Date  . Acid reflux   . Sleep apnea     had surgery to correct  . Sinus drainage   . Arthritis     Past Surgical History  Procedure Laterality Date  . Knee surgery      x2  . Rotator cuff repair      x2  . Trigger finger release    . Carpal tunnel release    . Colonoscopy  02/15/2005    [id number] small polyps ablated via cold biopsy, one from transverse colon and two from the rectum/Small external hemorrhoids  . Colonoscopy  07/10/2010    [id number] rectum/long redundant colon, polyps in the sigmoid, descending, hepatic flexure/ADENOMATOUS POLYPS. next TCS due 06/2015  . Esophagogastroduodenoscopy  1995    Gastritis  . Colonoscopy  1995    3 polyps, path revealed chronic colitis  . Esophagogastroduodenoscopy N/A 04/16/2013    Procedure: ESOPHAGOGASTRODUODENOSCOPY (EGD);  Surgeon: Daneil Dolin, MD;  [facility];  Service: Endoscopy;  Laterality: N/A;  3:30  . Uvulopalatopharyngoplasty    . Shoulder arthroscopy with rotator cuff repair and subacromial decompression Right 01/21/2015    Procedure: RIGHT SHOULDER ARTHROSCOPIC DEBRIDEMNT OF G-H JOINT AND REMOVAL OF LOOSE BODIES,ARTHROSCOPIC SUBACROMIAL DECOMPRESSION,MINI OPEN RCT REPAIR WITH SUPPLEMENTAL [facility] PATCH;  Surgeon: Garald Balding, MD;  Location: [place];  Service: Orthopedics;  Laterality: Right;     There were no vitals filed for this visit.  Visit Diagnosis:  Decreased strength  Pain in joint, shoulder region, right  Decreased ROM of right shoulder  S/P arthroscopy of shoulder  S/P rotator cuff repair      Subjective Assessment - 05/06/15 1420    Subjective My foot is flared up today and went to the ER on Sunday.  Had to stop exercises for shoulder because it hurt to stand up on it.   Points to pain in right upper trap muscle and tired feeling in right shoulder blade.     Currently in Pain? No/denies   Pain Score 0-No pain   Pain Location Shoulder   Pain Orientation Right   Aggravating Factors  reaching overhead weakness and pain   Pain Relieving Factors let it relax and massaging                         [facility] Adult PT Treatment/Exercise - 05/06/15 1518    Exercises   Exercises Shoulder   Shoulder Exercises: Supine   Flexion 10 reps   Shoulder Exercises: Seated   Other Seated Exercises upper trap right stretch 2x 30 sec with left hand overpressure   Other Seated Exercises scapular retraction/depresssion 10x   Shoulder Exercises: Prone   Extension AROM;Right;10 reps   Shoulder Exercises: Sidelying   Other Sidelying Exercises scapular retraction /depression 8x   Modalities  Modalities Moist Heat   Moist Heat Therapy   Number Minutes Moist Heat 10 Minutes   Moist Heat Location Shoulder  seated   Manual Therapy   Manual Therapy Myofascial release   Myofascial Release right upper trap and levator scap soft tissue work   Scapular Mobilization Grade 2 left sidelying, scapular mob   Passive ROM upper trap stretch          Trigger Point Dry Needling - 05/06/15 1521    Consent Given? Yes   Education Handout Provided Yes  including risk of pneumothorax signs/symptoms   Muscles Treated Upper Body Upper trapezius;Levator scapulae   Upper Trapezius Response Twitch reponse elicited;Palpable increased muscle length   Levator Scapulae Response  Twitch response elicited;Palpable increased muscle length              PT Education - 05/06/15 1523    Education provided Yes   Education Details dry needling info including signs and symptoms of pneumothorax; upper trap stretch; scapular retraction/depression; prone shoulder extension   Person(s) Educated Patient   Methods Explanation;Demonstration   Comprehension Verbalized understanding;Returned demonstration          PT Short Term Goals - 05/06/15 1533    PT SHORT TERM GOAL #1   Title "Independent with initial HEP   Status Achieved   PT SHORT TERM GOAL #2   Title "Report pain decrease from  5  /10 to   3 /10.   Status Achieved   PT SHORT TERM GOAL #3   Title "Demonstrate and verbalize understanding of condition management including RICE, positioning, HEP.    Status Achieved   PT SHORT TERM GOAL #4   Title Pt caregiver will be able to correctly execute PROM for Right shoulder for pt    Status Deferred           PT Long Term Goals - 05/06/15 1533    PT LONG TERM GOAL #1   Title "Pt will be independent with advanced HEP.    Time 16   Period Weeks   Status On-going   PT LONG TERM GOAL #2   Title "Pain will decrease to 1/10 with all functional activities   Time 16   Period Weeks   Status On-going   PT LONG TERM GOAL #3   Title "R shoulder AROM scaption will improve to 0-120  degrees for improved overhead reaching to reach dishes and retrieve items from freezer.   Time 16   Period Weeks   Status On-going   PT LONG TERM GOAL #4   Title "R shoulder AROM will return to [facility] to return to pain-free ADLs such as dressing and grooming   Time 16   Period Weeks   Status On-going   PT LONG TERM GOAL #5   Title "FOTO will improve from  55%  to  37%   indicating improved functional mobility .    Time 16   Period Weeks   Status On-going   PT LONG TERM GOAL #6   Title Pt will be able to drive without exacerbating pain and turn steering wheel for turns   Time 16    Period Weeks   Status On-going   PT LONG TERM GOAL #7   Title Pt will increase AROM of Right shoulder in order to style hair independently   Time 16   Period Weeks   Status On-going               Plan - 05/06/15 1525  Clinical Impression Statement Patient with significant shoulder hike with lifting right arm against gravity.  Multiple trigger points in right upper trap at insertion as well as origin.  Improved muscle length following manual interventions and dry needling.  Therapist closely monitoring response throughout treatment session.  Recommend continuation of periscapular and [place] strengthening with cueing to decrease shoulder hike.  Additionial dry needling as needed.     PT Next Visit Plan assess response to dry needling; strengthen in range of motion up to point of shoulder hike; scapular strengthening;  assess progress toward goals        Problem List Patient Active Problem List   Diagnosis Date Noted  . Complete tear of right rotator cuff 01/21/2015  . Severe obesity (BMI >= 40) 01/21/2015  . Nausea alone 04/04/2013  . H/O adenomatous polyp of colon 04/04/2013  . FATTY LIVER DISEASE 11/11/2010  . RUQ PAIN 11/11/2010  . NONSPEC ELEVATION OF LEVELS OF TRANSAMINASE/LDH 11/11/2010  . COLONIC POLYPS, HYPERPLASTIC, HX OF 06/25/2010  . HYPOTHYROIDISM 06/24/2010  . ANEMIA-NOS 06/24/2010    Alvera Singh 05/06/2015, 3:37 PM  [facility] [address] [place], [place], [address] Phone: [phone]   Fax:  [phone]  Ruben Im, PT 05/06/2015 3:37 PM Phone: [phone] Fax: [phone]

## 2015-05-07 DIAGNOSIS — L669 Cicatricial alopecia, unspecified: Secondary | ICD-10-CM | POA: Diagnosis not present

## 2015-05-08 ENCOUNTER — Ambulatory Visit: Payer: Medicare Other | Admitting: Physical Therapy

## 2015-05-08 DIAGNOSIS — R531 Weakness: Secondary | ICD-10-CM

## 2015-05-08 DIAGNOSIS — Z9889 Other specified postprocedural states: Secondary | ICD-10-CM | POA: Diagnosis not present

## 2015-05-08 DIAGNOSIS — M6281 Muscle weakness (generalized): Secondary | ICD-10-CM | POA: Diagnosis not present

## 2015-05-08 DIAGNOSIS — M25511 Pain in right shoulder: Secondary | ICD-10-CM | POA: Diagnosis not present

## 2015-05-08 DIAGNOSIS — M25611 Stiffness of right shoulder, not elsewhere classified: Secondary | ICD-10-CM

## 2015-05-08 NOTE — Therapy (Signed)
[place], [place], [address] Phone: [phone]   Fax:  [phone]  Physical Therapy Treatment  Patient Details  Name: Cheryl [name] MRN: [id number] Date of Birth: 05/24/1946 Referring Provider:  Sharilyn Sites, MD  Encounter Date: 05/08/2015      PT End of Session - 05/08/15 1336    Visit Number 22   Number of Visits 32   Date for PT Re-Evaluation 06/19/15   PT Start Time 1150   PT Stop Time 1240   PT Time Calculation (min) 50 min   Activity Tolerance Patient tolerated treatment well   Behavior During Therapy [facility] for tasks assessed/performed      Past Medical History  Diagnosis Date  . Acid reflux   . Sleep apnea     had surgery to correct  . Sinus drainage   . Arthritis     Past Surgical History  Procedure Laterality Date  . Knee surgery      x2  . Rotator cuff repair      x2  . Trigger finger release    . Carpal tunnel release    . Colonoscopy  02/15/2005    [id number] small polyps ablated via cold biopsy, one from transverse colon and two from the rectum/Small external hemorrhoids  . Colonoscopy  07/10/2010    [id number] rectum/long redundant colon, polyps in the sigmoid, descending, hepatic flexure/ADENOMATOUS POLYPS. next TCS due 06/2015  . Esophagogastroduodenoscopy  1995    Gastritis  . Colonoscopy  1995    3 polyps, path revealed chronic colitis  . Esophagogastroduodenoscopy N/A 04/16/2013    Procedure: ESOPHAGOGASTRODUODENOSCOPY (EGD);  Surgeon: Daneil Dolin, MD;  [facility];  Service: Endoscopy;  Laterality: N/A;  3:30  . Uvulopalatopharyngoplasty    . Shoulder arthroscopy with rotator cuff repair and subacromial decompression Right 01/21/2015    Procedure: RIGHT SHOULDER ARTHROSCOPIC DEBRIDEMNT OF G-H JOINT AND REMOVAL OF LOOSE BODIES,ARTHROSCOPIC SUBACROMIAL DECOMPRESSION,MINI OPEN RCT REPAIR WITH SUPPLEMENTAL [facility] PATCH;  Surgeon: Garald Balding, MD;   Location: [place];  Service: Orthopedics;  Laterality: Right;    There were no vitals filed for this visit.  Visit Diagnosis:  Decreased strength  Decreased ROM of right shoulder      Subjective Assessment - 05/08/15 1214    Subjective DN                          [facility] Adult PT Treatment/Exercise - 05/08/15 1150    Shoulder Exercises: Seated   Other Seated Exercises NUSTEP 7 minutes L5 pushes, pulls no problem.     Shoulder Exercises: Standing   Flexion Limitations Active assist, arm heavy, compensation if not assisted. 5 reps.   ABduction Limitations active at wall to support scapular.   Other Standing Exercises Ranger flexion, circles tried 1 LB weight but removed due to increased muscural compensation.   Other Standing Exercises arm slide on counter.  flexion, 1, 3 lbs ,, also circles simulation wipes counter, no pain , good shoulder position.     Shoulder Exercises: Isometric Strengthening   Flexion --  10 reps 5 second   Extension Limitations --  10 reps, 5 second   External Rotation Limitations --  10 reps, 5 seconds   ABduction --  10 seconds, 5 second holds.   Shoulder Exercises: Stretch   Corner Stretch --  doorway stretch, 5 reps 10 second holds, cued gentle. felt g   Cryotherapy  Number Minutes Cryotherapy 10 Minutes   Cryotherapy Location Shoulder   Type of Cryotherapy --  cold pack                PT Education - 05/08/15 1245    Education provided Yes   Education Details isometric shoulder   Person(s) Educated Patient   Methods Explanation;Demonstration;Verbal cues;Handout   Comprehension Verbalized understanding;Returned demonstration          PT Short Term Goals - 05/06/15 1533    PT SHORT TERM GOAL #1   Title "Independent with initial HEP   Status Achieved   PT SHORT TERM GOAL #2   Title "Report pain decrease from  5  /10 to   3 /10.   Status Achieved   PT SHORT TERM GOAL #3   Title "Demonstrate and verbalize  understanding of condition management including RICE, positioning, HEP.    Status Achieved   PT SHORT TERM GOAL #4   Title Pt caregiver will be able to correctly execute PROM for Right shoulder for pt    Status Deferred           PT Long Term Goals - 05/08/15 1338    PT LONG TERM GOAL #1   Title "Pt will be independent with advanced HEP.    Baseline independent with exercises given so far.   Time 16   Period Weeks   Status On-going   PT LONG TERM GOAL #2   Title "Pain will decrease to 1/10 with all functional activities   Baseline little pain with her limited use   Time 16   Period Weeks   Status On-going   PT LONG TERM GOAL #3   Title "R shoulder AROM scaption will improve to 0-120  degrees for improved overhead reaching to reach dishes and retrieve items from freezer.   Baseline 70 degrees   Time 16   Period Weeks   Status On-going   PT LONG TERM GOAL #4   Title "R shoulder AROM will return to [facility] to return to pain-free ADLs such as dressing and grooming   Baseline very limited AROM   Time 16   Period Weeks   Status On-going   PT LONG TERM GOAL #5   Title "FOTO will improve from  55%  to  37%   indicating improved functional mobility .    Baseline limitation 48%   Time 16   Period Weeks   Status On-going   PT LONG TERM GOAL #6   Title Pt will be able to drive without exacerbating pain and turn steering wheel for turns   Baseline not yet driving   Time 16   Period Weeks   Status On-going   PT LONG TERM GOAL #7   Title Pt will increase AROM of Right shoulder in order to style hair independently   Baseline unable to style   Time 16   Period Weeks   Status On-going               Plan - 05/08/15 1337    Clinical Impression Statement DN helpful.  Arm range active continue to be limited.  Compensation continues.     No pain at end of session.     Problem List Patient Active Problem List   Diagnosis Date Noted  . Complete tear of right rotator cuff  01/21/2015  . Severe obesity (BMI >= 40) 01/21/2015  . Nausea alone 04/04/2013  . H/O adenomatous polyp of colon 04/04/2013  .  FATTY LIVER DISEASE 11/11/2010  . RUQ PAIN 11/11/2010  . NONSPEC ELEVATION OF LEVELS OF TRANSAMINASE/LDH 11/11/2010  . COLONIC POLYPS, HYPERPLASTIC, HX OF 06/25/2010  . HYPOTHYROIDISM 06/24/2010  . ANEMIA-NOS 06/24/2010    HARRIS,KAREN 05/08/2015, 1:43 PM  [facility] [address] [place], [place], [address] Phone: [phone]   Fax:  [phone]  Melvenia Needles, PTA 05/08/2015 1:43 PM Phone: [phone] Fax: [phone]

## 2015-05-08 NOTE — Patient Instructions (Signed)
External Rotation (Isometric)   With elbow bent and held at side, use other hand to apply resistance to outward motion of arm. Hold 5-10__ seconds. Repeat _10__ times. Do __1__ sessions per day.  Copyright  VHI. All rights reserved.  Extension (Isometric)   Press elbow into the padded back of seat. Hold 5-10____ seconds. Relax. Repeat 10____ times. Do _1___ sessions per day.  Copyright  VHI. All rights reserved.  Abduction (Isometric)   Resist upward motion to the side with other hand on upper arm. Hold _5-10___ seconds. Relax. Repeat __10__ times. Do __Strengthening: Isometric Flexion   Using wall for resistance, press right fist into ball using light pressure. Hold __5-10__ seconds. Repeat _10___ times per set. Do 1-2____ sets per session. Do 1____ sessions per day.  http://orth.exer.us/801   Copyright  VHI. All rights reserved.  __ sessions per day. Activity: Push arm out to side against padded furniture.*  Copyright  VHI. All rights reserved.

## 2015-05-12 DIAGNOSIS — M24011 Loose body in right shoulder: Secondary | ICD-10-CM | POA: Diagnosis not present

## 2015-05-12 DIAGNOSIS — M19011 Primary osteoarthritis, right shoulder: Secondary | ICD-10-CM | POA: Diagnosis not present

## 2015-05-12 DIAGNOSIS — M7541 Impingement syndrome of right shoulder: Secondary | ICD-10-CM | POA: Diagnosis not present

## 2015-05-12 DIAGNOSIS — M75121 Complete rotator cuff tear or rupture of right shoulder, not specified as traumatic: Secondary | ICD-10-CM | POA: Diagnosis not present

## 2015-05-13 ENCOUNTER — Ambulatory Visit: Payer: Medicare Other | Admitting: Physical Therapy

## 2015-05-13 DIAGNOSIS — M6281 Muscle weakness (generalized): Secondary | ICD-10-CM | POA: Diagnosis not present

## 2015-05-13 DIAGNOSIS — M25511 Pain in right shoulder: Secondary | ICD-10-CM | POA: Diagnosis not present

## 2015-05-13 DIAGNOSIS — Z9889 Other specified postprocedural states: Secondary | ICD-10-CM | POA: Diagnosis not present

## 2015-05-13 DIAGNOSIS — R531 Weakness: Secondary | ICD-10-CM

## 2015-05-13 DIAGNOSIS — M25611 Stiffness of right shoulder, not elsewhere classified: Secondary | ICD-10-CM

## 2015-05-13 NOTE — Therapy (Signed)
[place], [place], [address] Phone: [phone]   Fax:  [phone]  Physical Therapy Treatment  Patient Details  Name: Cheryl [name] MRN: [id number] Date of Birth: March 27, 1946 Referring Provider:  Sharilyn Sites, MD  Encounter Date: 05/13/2015      PT End of Session - 05/13/15 1239    Visit Number 23   Number of Visits 32   Date for PT Re-Evaluation 06/19/15   PT Start Time 1146   PT Stop Time 1245   PT Time Calculation (min) 59 min   Activity Tolerance Patient tolerated treatment well      Past Medical History  Diagnosis Date  . Acid reflux   . Sleep apnea     had surgery to correct  . Sinus drainage   . Arthritis     Past Surgical History  Procedure Laterality Date  . Knee surgery      x2  . Rotator cuff repair      x2  . Trigger finger release    . Carpal tunnel release    . Colonoscopy  02/15/2005    [id number] small polyps ablated via cold biopsy, one from transverse colon and two from the rectum/Small external hemorrhoids  . Colonoscopy  07/10/2010    [id number] rectum/long redundant colon, polyps in the sigmoid, descending, hepatic flexure/ADENOMATOUS POLYPS. next TCS due 06/2015  . Esophagogastroduodenoscopy  1995    Gastritis  . Colonoscopy  1995    3 polyps, path revealed chronic colitis  . Esophagogastroduodenoscopy N/A 04/16/2013    Procedure: ESOPHAGOGASTRODUODENOSCOPY (EGD);  Surgeon: Daneil Dolin, MD;  [facility];  Service: Endoscopy;  Laterality: N/A;  3:30  . Uvulopalatopharyngoplasty    . Shoulder arthroscopy with rotator cuff repair and subacromial decompression Right 01/21/2015    Procedure: RIGHT SHOULDER ARTHROSCOPIC DEBRIDEMNT OF G-H JOINT AND REMOVAL OF LOOSE BODIES,ARTHROSCOPIC SUBACROMIAL DECOMPRESSION,MINI OPEN RCT REPAIR WITH SUPPLEMENTAL [facility] PATCH;  Surgeon: Garald Balding, MD;  Location: [place];  Service: Orthopedics;  Laterality: Right;     There were no vitals filed for this visit.  Visit Diagnosis:  Decreased strength  Decreased ROM of right shoulder  Pain in joint, shoulder region, right      Subjective Assessment - 05/13/15 1152    Subjective New order for aggressive strengthening and ROM,  Back to MD in 1 month.  No pain now, has some yesterday,  7/10   Currently in Pain? No/denies                         [facility] Adult PT Treatment/Exercise - 05/13/15 1153    Bed Mobility   Bed Mobility --  practiced pushing up from sidelying using RT shoulder , no p   Shoulder Exercises: Seated   Flexion 5 reps  3 sets, AA with brief hold at the top.   Other Seated Exercises arm bike forward 0 resistance  5 minutes   Other Seated Exercises Isometrics, 4 way 10 reps    Shoulder Exercises: Standing   ABduction Limitations Leaning over table 10 reps to 90 degrees   Theraband Level (Shoulder Extension) Level 3 (Green)  10 reps, tactile   Extension Weight (lbs) 2 LBS   Extension Limitations 10 reps leaning over bed   Theraband Level (Shoulder Row) Level 3 (Green)  10 reps good technique   Row Weight (lbs) 2 LBS   Row Limitations Leaning over table, cues guided scapula initially  Other Standing Exercises Lat pull down 1.5 plates   Shoulder Exercises: Pulleys   Flexion --  20 reps 5 second holds flexion RT   Shoulder Exercises: ROM/Strengthening   Other ROM/Strengthening Exercises wall ladder   Cryotherapy   Number Minutes Cryotherapy 10 Minutes   Cryotherapy Location Shoulder   Type of Cryotherapy --  cold pack                  PT Short Term Goals - 05/06/15 1533    PT SHORT TERM GOAL #1   Title "Independent with initial HEP   Status Achieved   PT SHORT TERM GOAL #2   Title "Report pain decrease from  5  /10 to   3 /10.   Status Achieved   PT SHORT TERM GOAL #3   Title "Demonstrate and verbalize understanding of condition management including RICE, positioning, HEP.    Status  Achieved   PT SHORT TERM GOAL #4   Title Pt caregiver will be able to correctly execute PROM for Right shoulder for pt    Status Deferred           PT Long Term Goals - 05/13/15 1252    PT LONG TERM GOAL #1   Title "Pt will be independent with advanced HEP.    Status On-going   PT LONG TERM GOAL #2   Title "Pain will decrease to 1/10 with all functional activities   Baseline 7/10 post MD apointment   Time 16   Period Weeks   Status On-going   PT LONG TERM GOAL #3   Title "R shoulder AROM scaption will improve to 0-120  degrees for improved overhead reaching to reach dishes and retrieve items from freezer.   Baseline 75 degrees   Time 16   Period Weeks   Status On-going   PT LONG TERM GOAL #4   Title "R shoulder AROM will return to [facility] to return to pain-free ADLs such as dressing and grooming   Time 16   Period Weeks   Status On-going   PT LONG TERM GOAL #5   Title "FOTO will improve from  55%  to  37%   indicating improved functional mobility .    Time 16   Period Weeks   Status Unable to assess   PT LONG TERM GOAL #6   Baseline not yet driving   Time 16   Period Weeks   Status On-going   PT LONG TERM GOAL #7   Title Pt will increase AROM of Right shoulder in order to style hair independently   Baseline unable to style   Time 16   Period Weeks   Status On-going               Plan - 05/13/15 1240    Clinical Impression Statement Patient can hold arm into flexion briefly.  Able to tolerate more exercise today,  No new goals met.  No pain post session.   PT Next Visit Plan continue strengthening, try body blade.   PT Home Exercise Plan continue   Consulted and Agree with Plan of Care Patient     Simulate driving. Reformer    Problem List Patient Active Problem List   Diagnosis Date Noted  . Complete tear of right rotator cuff 01/21/2015  . Severe obesity (BMI >= 40) 01/21/2015  . Nausea alone 04/04/2013  . H/O adenomatous polyp of colon  04/04/2013  . FATTY LIVER DISEASE 11/11/2010  . RUQ PAIN 11/11/2010  .  NONSPEC ELEVATION OF LEVELS OF TRANSAMINASE/LDH 11/11/2010  . COLONIC POLYPS, HYPERPLASTIC, HX OF 06/25/2010  . HYPOTHYROIDISM 06/24/2010  . ANEMIA-NOS 06/24/2010    Lanore Renderos 05/13/2015, 12:57 PM  [facility] [address] [place], [place], [address] Phone: [phone]   Fax:  [phone]  Melvenia Needles, PTA 05/13/2015 12:57 PM Phone: [phone] Fax: [phone]

## 2015-05-15 ENCOUNTER — Ambulatory Visit: Payer: Medicare Other | Admitting: Physical Therapy

## 2015-05-15 DIAGNOSIS — R531 Weakness: Secondary | ICD-10-CM

## 2015-05-15 DIAGNOSIS — M25611 Stiffness of right shoulder, not elsewhere classified: Secondary | ICD-10-CM

## 2015-05-15 DIAGNOSIS — M25511 Pain in right shoulder: Secondary | ICD-10-CM | POA: Diagnosis not present

## 2015-05-15 DIAGNOSIS — Z9889 Other specified postprocedural states: Secondary | ICD-10-CM | POA: Diagnosis not present

## 2015-05-15 DIAGNOSIS — M6281 Muscle weakness (generalized): Secondary | ICD-10-CM | POA: Diagnosis not present

## 2015-05-15 NOTE — Therapy (Signed)
[place], [place], [address] Phone: [phone]   Fax:  [phone]  Physical Therapy Treatment  Patient Details  Name: Cheryl [name] MRN: [id number] Date of Birth: 21-Oct-1946 Referring Provider:  Garald Balding, MD  Encounter Date: 05/15/2015      PT End of Session - 05/15/15 1304    Visit Number 24   Number of Visits 32   Date for PT Re-Evaluation 06/19/15   PT Start Time 1146   PT Stop Time 1245   PT Time Calculation (min) 59 min   Activity Tolerance Patient tolerated treatment well   Behavior During Therapy [facility] for tasks assessed/performed      Past Medical History  Diagnosis Date  . Acid reflux   . Sleep apnea     had surgery to correct  . Sinus drainage   . Arthritis     Past Surgical History  Procedure Laterality Date  . Knee surgery      x2  . Rotator cuff repair      x2  . Trigger finger release    . Carpal tunnel release    . Colonoscopy  02/15/2005    [id number] small polyps ablated via cold biopsy, one from transverse colon and two from the rectum/Small external hemorrhoids  . Colonoscopy  07/10/2010    [id number] rectum/long redundant colon, polyps in the sigmoid, descending, hepatic flexure/ADENOMATOUS POLYPS. next TCS due 06/2015  . Esophagogastroduodenoscopy  1995    Gastritis  . Colonoscopy  1995    3 polyps, path revealed chronic colitis  . Esophagogastroduodenoscopy N/A 04/16/2013    Procedure: ESOPHAGOGASTRODUODENOSCOPY (EGD);  Surgeon: Daneil Dolin, MD;  [facility];  Service: Endoscopy;  Laterality: N/A;  3:30  . Uvulopalatopharyngoplasty    . Shoulder arthroscopy with rotator cuff repair and subacromial decompression Right 01/21/2015    Procedure: RIGHT SHOULDER ARTHROSCOPIC DEBRIDEMNT OF G-H JOINT AND REMOVAL OF LOOSE BODIES,ARTHROSCOPIC SUBACROMIAL DECOMPRESSION,MINI OPEN RCT REPAIR WITH SUPPLEMENTAL [facility] PATCH;  Surgeon: Garald Balding, MD;   Location: [place];  Service: Orthopedics;  Laterality: Right;    There were no vitals filed for this visit.  Visit Diagnosis:  Decreased ROM of right shoulder  Decreased strength      Subjective Assessment - 05/15/15 1152    Subjective A littile sore after last session the next day.   No pain right now.  Lifting arm  still hard.                         [facility] Adult PT Treatment/Exercise - 05/15/15 1154    Shoulder Exercises: Seated   Other Seated Exercises arm bike forward 0 resistance  5 minutes  also standing to simulate driving 2 minutes each direction,    Shoulder Exercises: Prone   Flexion 10 reps  Y, single arm   Extension Weight (lbs) 3,4 LBS 10 reps each   Horizontal ABduction 1 10 reps   Shoulder Exercises: Standing   Theraband Level (Shoulder External Rotation) Level 4 (Blue)  very hard   Theraband Level (Shoulder Internal Rotation) Level 4 (Blue)   Flexion AROM;AAROM;Right;10 reps   ABduction Limitations Leaning over table 10 reps to 90 degrees   Extension Weight (lbs) 3LBS, 4LBS   10 reps each.   Row Right;10 reps;Theraband   Row Weight (lbs) 3LBS, 4 LBS 10 reps each   Row Limitations Leaning over table, cues guided scapula initially   Other Standing  Exercises Lat pull down   Other Standing Exercises wall push up   Shoulder Exercises: Pulleys   Flexion --  20 reps 5 second holds flexion RT   Shoulder Exercises: Stretch   Other Shoulder Stretches wall pushup with stretch , arms fully extended.   Shoulder Exercises: Body Blade   Other Body Blade Exercises push/pull/presses cues small oscillations   Cryotherapy   Number Minutes Cryotherapy 10 Minutes   Cryotherapy Location Shoulder   Type of Cryotherapy --  cold pack   Manual Therapy   Other Manual Therapy triggerpoint release upper trap                  PT Short Term Goals - 05/06/15 1533    PT SHORT TERM GOAL #1   Title "Independent with initial HEP   Status Achieved   PT  SHORT TERM GOAL #2   Title "Report pain decrease from  5  /10 to   3 /10.   Status Achieved   PT SHORT TERM GOAL #3   Title "Demonstrate and verbalize understanding of condition management including RICE, positioning, HEP.    Status Achieved   PT SHORT TERM GOAL #4   Title Pt caregiver will be able to correctly execute PROM for Right shoulder for pt    Status Deferred           PT Long Term Goals - 05/13/15 1252    PT LONG TERM GOAL #1   Title "Pt will be independent with advanced HEP.    Status On-going   PT LONG TERM GOAL #2   Title "Pain will decrease to 1/10 with all functional activities   Baseline 7/10 post MD apointment   Time 16   Period Weeks   Status On-going   PT LONG TERM GOAL #3   Title "R shoulder AROM scaption will improve to 0-120  degrees for improved overhead reaching to reach dishes and retrieve items from freezer.   Baseline 75 degrees   Time 16   Period Weeks   Status On-going   PT LONG TERM GOAL #4   Title "R shoulder AROM will return to [facility] to return to pain-free ADLs such as dressing and grooming   Time 16   Period Weeks   Status On-going   PT LONG TERM GOAL #5   Title "FOTO will improve from  55%  to  37%   indicating improved functional mobility .    Time 16   Period Weeks   Status Unable to assess   PT LONG TERM GOAL #6   Baseline not yet driving   Time 16   Period Weeks   Status On-going   PT LONG TERM GOAL #7   Title Pt will increase AROM of Right shoulder in order to style hair independently   Baseline unable to style   Time 16   Period Weeks   Status On-going               Plan - 05/15/15 1305    Clinical Impression Statement Strengthening. no new goals met   PT Next Visit Plan strengthening   Consulted and Agree with Plan of Care Patient        Problem List Patient Active Problem List   Diagnosis Date Noted  . Complete tear of right rotator cuff 01/21/2015  . Severe obesity (BMI >= 40) 01/21/2015  . Nausea  alone 04/04/2013  . H/O adenomatous polyp of colon 04/04/2013  . FATTY LIVER DISEASE  11/11/2010  . RUQ PAIN 11/11/2010  . NONSPEC ELEVATION OF LEVELS OF TRANSAMINASE/LDH 11/11/2010  . COLONIC POLYPS, HYPERPLASTIC, HX OF 06/25/2010  . HYPOTHYROIDISM 06/24/2010  . ANEMIA-NOS 06/24/2010    HARRIS,KAREN 05/15/2015, 1:07 PM  [facility] [address] [place], [place], [address] Phone: [phone]   Fax:  [phone] Melvenia Needles, PTA 05/15/2015 1:07 PM Phone: [phone] Fax: [phone]

## 2015-05-20 ENCOUNTER — Ambulatory Visit: Payer: Medicare Other | Admitting: Physical Therapy

## 2015-05-20 DIAGNOSIS — R531 Weakness: Secondary | ICD-10-CM

## 2015-05-20 DIAGNOSIS — M6281 Muscle weakness (generalized): Secondary | ICD-10-CM | POA: Diagnosis not present

## 2015-05-20 DIAGNOSIS — Z9889 Other specified postprocedural states: Secondary | ICD-10-CM | POA: Diagnosis not present

## 2015-05-20 DIAGNOSIS — M25611 Stiffness of right shoulder, not elsewhere classified: Secondary | ICD-10-CM

## 2015-05-20 DIAGNOSIS — M25511 Pain in right shoulder: Secondary | ICD-10-CM | POA: Diagnosis not present

## 2015-05-20 NOTE — Therapy (Signed)
[place], [place], [address] Phone: [phone]   Fax:  [phone]  Physical Therapy Treatment  Patient Details  Name: Cheryl [name] MRN: [id number] Date of Birth: [birth year] Referring Provider:  Sharilyn Sites, MD  Encounter Date: 05/20/2015      PT End of Session - 05/20/15 1307    Visit Number 25   Number of Visits 32   Date for PT Re-Evaluation 06/19/15   PT Start Time 1147   PT Stop Time 1245   PT Time Calculation (min) 58 min   Activity Tolerance Patient tolerated treatment well      Past Medical History  Diagnosis Date  . Acid reflux   . Sleep apnea     had surgery to correct  . Sinus drainage   . Arthritis     Past Surgical History  Procedure Laterality Date  . Knee surgery      x2  . Rotator cuff repair      x2  . Trigger finger release    . Carpal tunnel release    . Colonoscopy  02/15/2005    [id number] small polyps ablated via cold biopsy, one from transverse colon and two from the rectum/Small external hemorrhoids  . Colonoscopy  07/10/2010    [id number] rectum/long redundant colon, polyps in the sigmoid, descending, hepatic flexure/ADENOMATOUS POLYPS. next TCS due 06/2015  . Esophagogastroduodenoscopy  1995    Gastritis  . Colonoscopy  1995    3 polyps, path revealed chronic colitis  . Esophagogastroduodenoscopy N/A 04/16/2013    Procedure: ESOPHAGOGASTRODUODENOSCOPY (EGD);  Surgeon: Daneil Dolin, MD;  [facility];  Service: Endoscopy;  Laterality: N/A;  3:30  . Uvulopalatopharyngoplasty    . Shoulder arthroscopy with rotator cuff repair and subacromial decompression Right 01/21/2015    Procedure: RIGHT SHOULDER ARTHROSCOPIC DEBRIDEMNT OF G-H JOINT AND REMOVAL OF LOOSE BODIES,ARTHROSCOPIC SUBACROMIAL DECOMPRESSION,MINI OPEN RCT REPAIR WITH SUPPLEMENTAL [facility] PATCH;  Surgeon: Garald Balding, MD;  Location: [place];  Service: Orthopedics;  Laterality: Right;     There were no vitals filed for this visit.  Visit Diagnosis:  Decreased ROM of right shoulder  Decreased strength      Subjective Assessment - 05/20/15 1153    Subjective Sore Friday.   After work out Thursday.  Thursday afternoon she felt good.    Able to lift arm more.   Currently in Pain? No/denies   Multiple Pain Sites No   Pain Score 0                         [facility] Adult PT Treatment/Exercise - 05/20/15 1204    Shoulder Exercises: Prone   Flexion 10 reps  AROM   Extension Weight (lbs) 3,4,5 LBS 10 reps   Horizontal ABduction 1 10 reps   Shoulder Exercises: Pulleys   Flexion --  20 reps   Shoulder Exercises: ROM/Strengthening   UBE (Upper Arm Bike) L2 6 minutes  Also arm bike , L6 8 minutes   Other ROM/Strengthening Exercises wall ladder 5 reps, 2 reps Trigger point to supraspinatus tightnes.    SBA    Shoulder Exercises: Power Development worker, community Limitations 2 sets red cued   Retraction Limitations 2 sets, red cued   Cryotherapy   Number Minutes Cryotherapy 10 Minutes   Cryotherapy Location Shoulder   Type of Cryotherapy --  cold pack  PT Short Term Goals - 05/06/15 1533    PT SHORT TERM GOAL #1   Title "Independent with initial HEP   Status Achieved   PT SHORT TERM GOAL #2   Title "Report pain decrease from  5  /10 to   3 /10.   Status Achieved   PT SHORT TERM GOAL #3   Title "Demonstrate and verbalize understanding of condition management including RICE, positioning, HEP.    Status Achieved   PT SHORT TERM GOAL #4   Title Pt caregiver will be able to correctly execute PROM for Right shoulder for pt    Status Deferred           PT Long Term Goals - 05/20/15 1311    PT LONG TERM GOAL #1   Time 16   Period Weeks   Status On-going   PT LONG TERM GOAL #2   Title "Pain will decrease to 1/10 with all functional activities   Time 16   Period Weeks   Status On-going   PT LONG TERM GOAL #3   Title "R  shoulder AROM scaption will improve to 0-120  degrees for improved overhead reaching to reach dishes and retrieve items from freezer.   Baseline 115   Time 16   Period Weeks   Status On-going   PT LONG TERM GOAL #4   Title "R shoulder AROM will return to [facility] to return to pain-free ADLs such as dressing and grooming   Time 16   Period Weeks   Status On-going   PT LONG TERM GOAL #5   Title "FOTO will improve from  55%  to  37%   indicating improved functional mobility .    Time 16   Period Weeks   Status Unable to assess   PT LONG TERM GOAL #6   Title Pt will be able to drive without exacerbating pain and turn steering wheel for turns   Baseline 1/10 of a mile.  pain increased.     Time 16   Period Weeks   Status On-going   PT LONG TERM GOAL #7   Title Pt will increase AROM of Right shoulder in order to style hair independently   Time 16   Period Weeks   Status On-going               Plan - 05/20/15 1308    Clinical Impression Statement 115 AROM.  Function, strength improving.  She did not like the reformer For shoulder extension, rows today.   PT Next Visit Plan Strengthening   Consulted and Agree with Plan of Care Patient        Problem List Patient Active Problem List   Diagnosis Date Noted  . Complete tear of right rotator cuff 01/21/2015  . Severe obesity (BMI >= 40) 01/21/2015  . Nausea alone 04/04/2013  . H/O adenomatous polyp of colon 04/04/2013  . FATTY LIVER DISEASE 11/11/2010  . RUQ PAIN 11/11/2010  . NONSPEC ELEVATION OF LEVELS OF TRANSAMINASE/LDH 11/11/2010  . COLONIC POLYPS, HYPERPLASTIC, HX OF 06/25/2010  . HYPOTHYROIDISM 06/24/2010  . ANEMIA-NOS 06/24/2010    HARRIS,KAREN 05/20/2015, 1:15 PM  [facility] [address] [place], [place], [address] Phone: [phone]   Fax:  [phone] Melvenia Needles, PTA 05/20/2015 1:15 PM Phone: [phone] Fax: [phone]

## 2015-05-22 ENCOUNTER — Ambulatory Visit: Payer: Medicare Other | Attending: Orthopaedic Surgery | Admitting: Physical Therapy

## 2015-05-22 DIAGNOSIS — M25511 Pain in right shoulder: Secondary | ICD-10-CM | POA: Insufficient documentation

## 2015-05-22 DIAGNOSIS — M25611 Stiffness of right shoulder, not elsewhere classified: Secondary | ICD-10-CM

## 2015-05-22 DIAGNOSIS — Z9889 Other specified postprocedural states: Secondary | ICD-10-CM | POA: Diagnosis not present

## 2015-05-22 DIAGNOSIS — M6281 Muscle weakness (generalized): Secondary | ICD-10-CM | POA: Insufficient documentation

## 2015-05-22 DIAGNOSIS — R531 Weakness: Secondary | ICD-10-CM

## 2015-05-22 NOTE — Therapy (Signed)
[place], [place], [address] Phone: [phone]   Fax:  [phone]  Physical Therapy Treatment  Patient Details  Name: Cheryl [name] MRN: [id number] Date of Birth: 1946-01-27 Referring Provider:  Garald Balding, MD  Encounter Date: 05/22/2015      PT End of Session - 05/22/15 1322    Visit Number 26   Number of Visits 32   Date for PT Re-Evaluation 06/19/15   PT Start Time 1101   PT Stop Time 1157   PT Time Calculation (min) 56 min   Activity Tolerance Patient tolerated treatment well   Behavior During Therapy [facility] for tasks assessed/performed      Past Medical History  Diagnosis Date  . Acid reflux   . Sleep apnea     had surgery to correct  . Sinus drainage   . Arthritis     Past Surgical History  Procedure Laterality Date  . Knee surgery      x2  . Rotator cuff repair      x2  . Trigger finger release    . Carpal tunnel release    . Colonoscopy  02/15/2005    [id number] small polyps ablated via cold biopsy, one from transverse colon and two from the rectum/Small external hemorrhoids  . Colonoscopy  07/10/2010    [id number] rectum/long redundant colon, polyps in the sigmoid, descending, hepatic flexure/ADENOMATOUS POLYPS. next TCS due 06/2015  . Esophagogastroduodenoscopy  1995    Gastritis  . Colonoscopy  1995    3 polyps, path revealed chronic colitis  . Esophagogastroduodenoscopy N/A 04/16/2013    Procedure: ESOPHAGOGASTRODUODENOSCOPY (EGD);  Surgeon: Daneil Dolin, MD;  [facility];  Service: Endoscopy;  Laterality: N/A;  3:30  . Uvulopalatopharyngoplasty    . Shoulder arthroscopy with rotator cuff repair and subacromial decompression Right 01/21/2015    Procedure: RIGHT SHOULDER ARTHROSCOPIC DEBRIDEMNT OF G-H JOINT AND REMOVAL OF LOOSE BODIES,ARTHROSCOPIC SUBACROMIAL DECOMPRESSION,MINI OPEN RCT REPAIR WITH SUPPLEMENTAL [facility] PATCH;  Surgeon: Garald Balding, MD;   Location: [place];  Service: Orthopedics;  Laterality: Right;    There were no vitals filed for this visit.  Visit Diagnosis:  Decreased ROM of right shoulder  Decreased strength      Subjective Assessment - 05/22/15 1103    Subjective Stiff only.  Able to vacume her living room for the first time.   Currently in Pain? No/denies   Multiple Pain Sites No                         [facility] Adult PT Treatment/Exercise - 05/22/15 1104    Shoulder Exercises: Seated   Theraband Level (Shoulder External Rotation) Level 1 (Yellow)   Flexion 10 reps  narrowed grip 10 reps   Theraband Level (Shoulder Flexion) Level 1 (Yellow)   Shoulder Exercises: Prone   Extension Weight (lbs) 4 LBS 10 reps   Shoulder Exercises: Standing   Internal Rotation Weight (lbs) 2 LBS  10 reps, weight on cane, both    Flexion 10 reps  2 LBS on cane, both steps   Extension Weight (lbs) 2 LBS  10 reps, weight on cane, both   Other Standing Exercises soft balls thrown 9 reps, no pain   Shoulder Exercises: Pulleys   Flexion --  30 reps   Shoulder Exercises: ROM/Strengthening   UBE (Upper Arm Bike) Nustep L6, 15 minutes   Other ROM/Strengthening Exercises Lat pull down, progressed to  3 sets 2 plates.   Wrist Exercises   Bar Weights/Barbell (Forearm Supination) 3 lbs  10   Bar Weights/Barbell (Forearm Pronation) 3 lbs  10   Other wrist exercises 4 LBS wrist curls 3 way  Biceps 4 LBS, 10   Cryotherapy   Number Minutes Cryotherapy 10 Minutes   Cryotherapy Location Shoulder   Type of Cryotherapy --  cold pack                  PT Short Term Goals - 05/06/15 1533    PT SHORT TERM GOAL #1   Title "Independent with initial HEP   Status Achieved   PT SHORT TERM GOAL #2   Title "Report pain decrease from  5  /10 to   3 /10.   Status Achieved   PT SHORT TERM GOAL #3   Title "Demonstrate and verbalize understanding of condition management including RICE, positioning, HEP.    Status  Achieved   PT SHORT TERM GOAL #4   Title Pt caregiver will be able to correctly execute PROM for Right shoulder for pt    Status Deferred           PT Long Term Goals - 05/22/15 1123    PT LONG TERM GOAL #1   Title "Pt will be independent with advanced HEP.    Time 16   Period Weeks   Status On-going   PT LONG TERM GOAL #2   Title "Pain will decrease to 1/10 with all functional activities   Baseline No pain with cooking and vacume.  No yet back to all ADL's   Time 16   Period Weeks   Status On-going   PT LONG TERM GOAL #3   Title "R shoulder AROM scaption will improve to 0-120  degrees for improved overhead reaching to reach dishes and retrieve items from freezer.   Baseline 115   Time 16   Period Weeks   Status On-going   PT LONG TERM GOAL #4   Title "R shoulder AROM will return to [facility] to return to pain-free ADLs such as dressing and grooming   Baseline limited a little   Time 16   Period Weeks   Status On-going   PT LONG TERM GOAL #5   Title "FOTO will improve from  55%  to  37%   indicating improved functional mobility .    Time 16   Period Weeks   Status Unable to assess   PT LONG TERM GOAL #6   Title Pt will be able to drive without exacerbating pain and turn steering wheel for turns   Time 16   Period Weeks   Status On-going   PT LONG TERM GOAL #7   Title Pt will increase AROM of Right shoulder in order to style hair independently   Baseline limitede some   Time 16   Period Weeks   Status On-going               Plan - 05/22/15 1322    Clinical Impression Statement Function improving,  Pain goals partially met.   PT Next Visit Plan Strengthening   Consulted and Agree with Plan of Care Patient        Problem List Patient Active Problem List   Diagnosis Date Noted  . Complete tear of right rotator cuff 01/21/2015  . Severe obesity (BMI >= 40) 01/21/2015  . Nausea alone 04/04/2013  . H/O adenomatous polyp of colon 04/04/2013  . FATTY  LIVER  DISEASE 11/11/2010  . RUQ PAIN 11/11/2010  . NONSPEC ELEVATION OF LEVELS OF TRANSAMINASE/LDH 11/11/2010  . COLONIC POLYPS, HYPERPLASTIC, HX OF 06/25/2010  . HYPOTHYROIDISM 06/24/2010  . ANEMIA-NOS 06/24/2010    Maris Bena 05/22/2015, 1:24 PM  [place], [place], [address] Phone: [phone]   Fax:  [phone]  Melvenia Needles, PTA 05/22/2015 1:24 PM Phone: [phone] Fax: [phone]

## 2015-05-27 ENCOUNTER — Ambulatory Visit: Payer: Medicare Other | Admitting: Physical Therapy

## 2015-05-27 DIAGNOSIS — M25511 Pain in right shoulder: Secondary | ICD-10-CM

## 2015-05-27 DIAGNOSIS — Z9889 Other specified postprocedural states: Secondary | ICD-10-CM | POA: Diagnosis not present

## 2015-05-27 DIAGNOSIS — R531 Weakness: Secondary | ICD-10-CM

## 2015-05-27 DIAGNOSIS — M6281 Muscle weakness (generalized): Secondary | ICD-10-CM | POA: Diagnosis not present

## 2015-05-27 DIAGNOSIS — M25611 Stiffness of right shoulder, not elsewhere classified: Secondary | ICD-10-CM

## 2015-05-27 NOTE — Therapy (Signed)
[place], [place], [address] Phone: [phone]   Fax:  [phone]  Physical Therapy Treatment  Patient Details  Name: Cheryl [name] MRN: [id number] Date of Birth: 14-Mar-1946 Referring Provider:  Sharilyn Sites, MD  Encounter Date: 05/27/2015      PT End of Session - 05/27/15 1308    Visit Number 27   Number of Visits 32   Date for PT Re-Evaluation 06/19/15   PT Start Time 1100   PT Stop Time 1257   PT Time Calculation (min) 117 min      Past Medical History  Diagnosis Date  . Acid reflux   . Sleep apnea     had surgery to correct  . Sinus drainage   . Arthritis     Past Surgical History  Procedure Laterality Date  . Knee surgery      x2  . Rotator cuff repair      x2  . Trigger finger release    . Carpal tunnel release    . Colonoscopy  02/15/2005    [id number] small polyps ablated via cold biopsy, one from transverse colon and two from the rectum/Small external hemorrhoids  . Colonoscopy  07/10/2010    [id number] rectum/long redundant colon, polyps in the sigmoid, descending, hepatic flexure/ADENOMATOUS POLYPS. next TCS due 06/2015  . Esophagogastroduodenoscopy  1995    Gastritis  . Colonoscopy  1995    3 polyps, path revealed chronic colitis  . Esophagogastroduodenoscopy N/A 04/16/2013    Procedure: ESOPHAGOGASTRODUODENOSCOPY (EGD);  Surgeon: Daneil Dolin, MD;  [facility];  Service: Endoscopy;  Laterality: N/A;  3:30  . Uvulopalatopharyngoplasty    . Shoulder arthroscopy with rotator cuff repair and subacromial decompression Right 01/21/2015    Procedure: RIGHT SHOULDER ARTHROSCOPIC DEBRIDEMNT OF G-H JOINT AND REMOVAL OF LOOSE BODIES,ARTHROSCOPIC SUBACROMIAL DECOMPRESSION,MINI OPEN RCT REPAIR WITH SUPPLEMENTAL [facility] PATCH;  Surgeon: Garald Balding, MD;  Location: [place];  Service: Orthopedics;  Laterality: Right;    There were no vitals filed for this visit.  Visit  Diagnosis:  Decreased ROM of right shoulder  Decreased strength  Pain in joint, shoulder region, right      Subjective Assessment - 05/27/15 1300    Subjective No pain today, able to pull a chain overhead with are without thinking,  Has pain flare Sun and Monday better pain meds and scapular squeeses.  Pain peri scapular, not sure what increased pain,  spasms.    Currently in Pain? No/denies  see comments above                         [facility] Adult PT Treatment/Exercise - 05/27/15 1118    Shoulder Exercises: Supine   Theraband Level (Shoulder ABduction) Level 2 (Red)   ABduction Limitations 10 reps   Other Supine Exercises Red band Diagonals 10 reps each PTA guiding initially, cued intermittantly.     Shoulder Exercises: Seated   Flexion --  PROM, 5 reps   Abduction --  PROM, 5 reps   Other Seated Exercises scaption AROM 5 reps, cues   Shoulder Exercises: Prone   Extension Weight (lbs) 4 LBS  ,2 sets  Also row 5 LBS 10 reps 2 sets   Shoulder Exercises: Standing   Horizontal ABduction 10 reps  red band, Shoulder hike, unable to correct so progressed sup   Flexion 5 reps   Shoulder Exercises: ROM/Strengthening   UBE (Upper Arm Bike) Arm  Bile L3, !0 minutes  L6  Nu step   Other ROM/Strengthening Exercises Lat pull down, progressed to 3 sets 2 plates.   Other ROM/Strengthening Exercises wall press up, 10 reps   Cryotherapy   Number Minutes Cryotherapy 10 Minutes   Cryotherapy Location Shoulder   Type of Cryotherapy --  cold pack                  PT Short Term Goals - 05/06/15 1533    PT SHORT TERM GOAL #1   Title "Independent with initial HEP   Status Achieved   PT SHORT TERM GOAL #2   Title "Report pain decrease from  5  /10 to   3 /10.   Status Achieved   PT SHORT TERM GOAL #3   Title "Demonstrate and verbalize understanding of condition management including RICE, positioning, HEP.    Status Achieved   PT SHORT TERM GOAL #4   Title Pt  caregiver will be able to correctly execute PROM for Right shoulder for pt    Status Deferred           PT Long Term Goals - 05/22/15 1123    PT LONG TERM GOAL #1   Title "Pt will be independent with advanced HEP.    Time 16   Period Weeks   Status On-going   PT LONG TERM GOAL #2   Title "Pain will decrease to 1/10 with all functional activities   Baseline No pain with cooking and vacume.  No yet back to all ADL's   Time 16   Period Weeks   Status On-going   PT LONG TERM GOAL #3   Title "R shoulder AROM scaption will improve to 0-120  degrees for improved overhead reaching to reach dishes and retrieve items from freezer.   Baseline 115   Time 16   Period Weeks   Status On-going   PT LONG TERM GOAL #4   Title "R shoulder AROM will return to [facility] to return to pain-free ADLs such as dressing and grooming   Baseline limited a little   Time 16   Period Weeks   Status On-going   PT LONG TERM GOAL #5   Title "FOTO will improve from  55%  to  37%   indicating improved functional mobility .    Time 16   Period Weeks   Status Unable to assess   PT LONG TERM GOAL #6   Title Pt will be able to drive without exacerbating pain and turn steering wheel for turns   Time 16   Period Weeks   Status On-going   PT LONG TERM GOAL #7   Title Pt will increase AROM of Right shoulder in order to style hair independently   Baseline limitede some   Time 16   Period Weeks   Status On-going               Plan - 05/27/15 1309    Clinical Impression Statement function continues to improve, endurance improving.     PT Next Visit Plan strength and improving   Consulted and Agree with Plan of Care Patient        Problem List Patient Active Problem List   Diagnosis Date Noted  . Complete tear of right rotator cuff 01/21/2015  . Severe obesity (BMI >= 40) 01/21/2015  . Nausea alone 04/04/2013  . H/O adenomatous polyp of colon 04/04/2013  . FATTY LIVER DISEASE 11/11/2010  . RUQ  PAIN  11/11/2010  . NONSPEC ELEVATION OF LEVELS OF TRANSAMINASE/LDH 11/11/2010  . COLONIC POLYPS, HYPERPLASTIC, HX OF 06/25/2010  . HYPOTHYROIDISM 06/24/2010  . ANEMIA-NOS 06/24/2010    Arlynn Stare 05/27/2015, 1:12 PM  [facility] [address] [place], [place], [address] Phone: [phone]   Fax:  [phone]  Melvenia Needles, PTA 05/27/2015 1:12 PM Phone: [phone] Fax: [phone]

## 2015-05-29 ENCOUNTER — Ambulatory Visit: Payer: Medicare Other | Admitting: Physical Therapy

## 2015-05-29 DIAGNOSIS — E782 Mixed hyperlipidemia: Secondary | ICD-10-CM | POA: Diagnosis not present

## 2015-05-29 DIAGNOSIS — M25611 Stiffness of right shoulder, not elsewhere classified: Secondary | ICD-10-CM

## 2015-05-29 DIAGNOSIS — M79671 Pain in right foot: Secondary | ICD-10-CM | POA: Diagnosis not present

## 2015-05-29 DIAGNOSIS — Z6841 Body Mass Index (BMI) 40.0 and over, adult: Secondary | ICD-10-CM | POA: Diagnosis not present

## 2015-05-29 DIAGNOSIS — M25511 Pain in right shoulder: Secondary | ICD-10-CM | POA: Diagnosis not present

## 2015-05-29 DIAGNOSIS — M6281 Muscle weakness (generalized): Secondary | ICD-10-CM | POA: Diagnosis not present

## 2015-05-29 DIAGNOSIS — R531 Weakness: Secondary | ICD-10-CM

## 2015-05-29 DIAGNOSIS — Z9889 Other specified postprocedural states: Secondary | ICD-10-CM | POA: Diagnosis not present

## 2015-05-29 DIAGNOSIS — Z0000 Encounter for general adult medical examination without abnormal findings: Secondary | ICD-10-CM | POA: Diagnosis not present

## 2015-05-29 DIAGNOSIS — R739 Hyperglycemia, unspecified: Secondary | ICD-10-CM | POA: Diagnosis not present

## 2015-05-29 NOTE — Therapy (Signed)
[place], [place], [address] Phone: [phone]   Fax:  [phone]  Physical Therapy Treatment  Patient Details  Name: Cheryl [name] MRN: [id number] Date of Birth: February 26, 1946 Referring Provider:  Sharilyn Sites, MD  Encounter Date: 05/29/2015      PT End of Session - 05/29/15 1348    Visit Number 28   Number of Visits 32   Date for PT Re-Evaluation 06/19/15   PT Start Time 1104   PT Stop Time 1200   PT Time Calculation (min) 56 min   Activity Tolerance Patient tolerated treatment well      Past Medical History  Diagnosis Date  . Acid reflux   . Sleep apnea     had surgery to correct  . Sinus drainage   . Arthritis     Past Surgical History  Procedure Laterality Date  . Knee surgery      x2  . Rotator cuff repair      x2  . Trigger finger release    . Carpal tunnel release    . Colonoscopy  02/15/2005    [id number] small polyps ablated via cold biopsy, one from transverse colon and two from the rectum/Small external hemorrhoids  . Colonoscopy  07/10/2010    [id number] rectum/long redundant colon, polyps in the sigmoid, descending, hepatic flexure/ADENOMATOUS POLYPS. next TCS due 06/2015  . Esophagogastroduodenoscopy  1995    Gastritis  . Colonoscopy  1995    3 polyps, path revealed chronic colitis  . Esophagogastroduodenoscopy N/A 04/16/2013    Procedure: ESOPHAGOGASTRODUODENOSCOPY (EGD);  Surgeon: Daneil Dolin, MD;  [facility];  Service: Endoscopy;  Laterality: N/A;  3:30  . Uvulopalatopharyngoplasty    . Shoulder arthroscopy with rotator cuff repair and subacromial decompression Right 01/21/2015    Procedure: RIGHT SHOULDER ARTHROSCOPIC DEBRIDEMNT OF G-H JOINT AND REMOVAL OF LOOSE BODIES,ARTHROSCOPIC SUBACROMIAL DECOMPRESSION,MINI OPEN RCT REPAIR WITH SUPPLEMENTAL [facility]: [facility] Rhonda W PATCH;  Surgeon: Garald Balding, MD;  Location: [place];  Service: Orthopedics;  Laterality: Right;     There were no vitals filed for this visit.  Visit Diagnosis:  Decreased ROM of right shoulder  Decreased strength      Subjective Assessment - 05/29/15 1122    Subjective No pain .  Doing her home exercise.  was sore but was able to work it out, deltiod.   No questions   Currently in Pain? No/denies                         [facility] Adult PT Treatment/Exercise - 05/29/15 1105    Elbow Exercises   Elbow Flexion 10 reps  5 lbs.    Shoulder Exercises: Supine   Horizontal ABduction 10 reps  yellow band noted improved arm control   Theraband Level (Shoulder External Rotation) Level 1 (Yellow)   Theraband Level (Shoulder Flexion) Level 1 (Yellow)  scaption,improved arm control   Other Supine Exercises triceps 3 LBS 2 sets   Other Supine Exercises yellow bands d1-2 guided arm initially   Shoulder Exercises: Seated   Theraband Level (Shoulder External Rotation) Level 1 (Yellow)  10 reps   ABduction Limitations AROM 10 reps   Shoulder Exercises: Prone   Extension --  4 ,5 lbs 10 reps each, guidance of scapula   Other Prone Exercises 5 LBS row leaning over patient.     Shoulder Exercises: Standing   Flexion 10 reps  yellow band , improved technique  Flexion Limitations 120 active, 140 passive   Shoulder Exercises: Pulleys   Flexion --  20 reps   Shoulder Exercises: ROM/Strengthening   UBE (Upper Arm Bike) Arm bike, L3, ,3 minutes each direction   Other ROM/Strengthening Exercises Lat pull down, progressed to 3 sets 2 plates.   Other ROM/Strengthening Exercises abd to 90, then add to clap 5 reps, 2 sets   Cryotherapy   Number Minutes Cryotherapy 10 Minutes   Cryotherapy Location Shoulder   Type of Cryotherapy --  cold pack                  PT Short Term Goals - 05/06/15 1533    PT SHORT TERM GOAL #1   Title "Independent with initial HEP   Status Achieved   PT SHORT TERM GOAL #2   Title "Report pain decrease from  5  /10 to   3 /10.   Status  Achieved   PT SHORT TERM GOAL #3   Title "Demonstrate and verbalize understanding of condition management including RICE, positioning, HEP.    Status Achieved   PT SHORT TERM GOAL #4   Title Pt caregiver will be able to correctly execute PROM for Right shoulder for pt    Status Deferred           PT Long Term Goals - 05/29/15 1351    PT LONG TERM GOAL #1   Title "Pt will be independent with advanced HEP.    Time 16   Period Weeks   Status On-going   PT LONG TERM GOAL #2   Title "Pain will decrease to 1/10 with all functional activities   Baseline pain varies    Time 16   Period Weeks   Status On-going   PT LONG TERM GOAL #3   Title "R shoulder AROM scaption will improve to 0-120  degrees for improved overhead reaching to reach dishes and retrieve items from freezer.   Baseline 120   Time 16   Period Weeks   Status Achieved   PT LONG TERM GOAL #4   Title "R shoulder AROM will return to [facility] to return to pain-free ADLs such as dressing and grooming   Baseline limited a little   Time 16   Status On-going   PT LONG TERM GOAL #5   Title "FOTO will improve from  55%  to  37%   indicating improved functional mobility .    Time 16   Period Weeks   Status Unable to assess   PT LONG TERM GOAL #6   Title Pt will be able to drive without exacerbating pain and turn steering wheel for turns   Baseline not driving   Time 16   Status On-going   PT LONG TERM GOAL #7   Title Pt will increase AROM of Right shoulder in order to style hair independently   Baseline 30 degress less flexion active.  Passive flexion equal   Time 16   Period Weeks   Status On-going               Plan - 05/29/15 1349    Clinical Impression Statement Compensations noted with fatigue.  Diagonals supine required less guidance.cues.   PT Next Visit Plan strength,endurance, supraspinatus UE ranger        Problem List Patient Active Problem List   Diagnosis Date Noted  . Complete tear of right  rotator cuff 01/21/2015  . Severe obesity (BMI >= 40) 01/21/2015  . Nausea  alone 04/04/2013  . H/O adenomatous polyp of colon 04/04/2013  . FATTY LIVER DISEASE 11/11/2010  . RUQ PAIN 11/11/2010  . NONSPEC ELEVATION OF LEVELS OF TRANSAMINASE/LDH 11/11/2010  . COLONIC POLYPS, HYPERPLASTIC, HX OF 06/25/2010  . HYPOTHYROIDISM 06/24/2010  . ANEMIA-NOS 06/24/2010    Jailan Trimm 05/29/2015, 1:54 PM  [facility] [address] [place], [place], [address] Phone: [phone]   Fax:  [phone]  Melvenia Needles, PTA 05/29/2015 1:54 PM Phone: [phone] Fax: [phone]

## 2015-06-03 ENCOUNTER — Ambulatory Visit: Payer: Medicare Other | Admitting: Physical Therapy

## 2015-06-03 ENCOUNTER — Encounter: Payer: Self-pay | Admitting: Internal Medicine

## 2015-06-03 DIAGNOSIS — M25511 Pain in right shoulder: Secondary | ICD-10-CM

## 2015-06-03 DIAGNOSIS — M25611 Stiffness of right shoulder, not elsewhere classified: Secondary | ICD-10-CM

## 2015-06-03 DIAGNOSIS — Z9889 Other specified postprocedural states: Secondary | ICD-10-CM | POA: Diagnosis not present

## 2015-06-03 DIAGNOSIS — R531 Weakness: Secondary | ICD-10-CM

## 2015-06-03 DIAGNOSIS — M6281 Muscle weakness (generalized): Secondary | ICD-10-CM | POA: Diagnosis not present

## 2015-06-03 NOTE — Therapy (Signed)
[place], [place], [address] Phone: [phone]   Fax:  [phone]  Physical Therapy Treatment  Patient Details  Name: Cheryl [name] MRN: [id number] Date of Birth: [birth year] Referring Provider:  Sharilyn Sites, MD  Encounter Date: 06/03/2015      PT End of Session - 06/03/15 1158    Activity Tolerance Patient tolerated treatment well   Behavior During Therapy [facility] for tasks assessed/performed      Past Medical History  Diagnosis Date  . Acid reflux   . Sleep apnea     had surgery to correct  . Sinus drainage   . Arthritis     Past Surgical History  Procedure Laterality Date  . Knee surgery      x2  . Rotator cuff repair      x2  . Trigger finger release    . Carpal tunnel release    . Colonoscopy  02/15/2005    [id number] small polyps ablated via cold biopsy, one from transverse colon and two from the rectum/Small external hemorrhoids  . Colonoscopy  07/10/2010    [id number] rectum/long redundant colon, polyps in the sigmoid, descending, hepatic flexure/ADENOMATOUS POLYPS. next TCS due 06/2015  . Esophagogastroduodenoscopy  1995    Gastritis  . Colonoscopy  1995    3 polyps, path revealed chronic colitis  . Esophagogastroduodenoscopy N/A 04/16/2013    Procedure: ESOPHAGOGASTRODUODENOSCOPY (EGD);  Surgeon: Daneil Dolin, MD;  [facility];  Service: Endoscopy;  Laterality: N/A;  3:30  . Uvulopalatopharyngoplasty    . Shoulder arthroscopy with rotator cuff repair and subacromial decompression Right 01/21/2015    Procedure: RIGHT SHOULDER ARTHROSCOPIC DEBRIDEMNT OF G-H JOINT AND REMOVAL OF LOOSE BODIES,ARTHROSCOPIC SUBACROMIAL DECOMPRESSION,MINI OPEN RCT REPAIR WITH SUPPLEMENTAL [facility] PATCH;  Surgeon: Garald Balding, MD;  Location: [place];  Service: Orthopedics;  Laterality: Right;    There were no vitals filed for this visit.  Visit Diagnosis:  Decreased ROM of right  shoulder  Decreased strength  Pain in joint, shoulder region, right      Subjective Assessment - 06/03/15 1112    Subjective pain with ADL's 3/10 on a day to day basis.     Currently in Pain? Yes   Pain Score 3   5 at the most   Pain Location Shoulder   Pain Orientation Right   Pain Descriptors / Indicators Aching   Pain Radiating Towards deltiod    Pain Frequency Intermittent   Aggravating Factors  sleeping on shoulder   Pain Relieving Factors rubbing   Multiple Pain Sites No                         [facility] Adult PT Treatment/Exercise - 06/03/15 1116    Shoulder Exercises: Standing   Theraband Level (Shoulder Horizontal ABduction) Level 1 (Yellow)  20 reps, back to wall   Theraband Level (Shoulder External Rotation) Level 1 (Yellow)  20 reps   Flexion 10 reps  to 90 degrees,back at wall, compensation, hiking continue   ABduction 10 reps  to 90   Other Standing Exercises resisted reach to eyelevel 10 reps 2/10 pain   Shoulder Exercises: Pulleys   Flexion --  20   Shoulder Exercises: ROM/Strengthening   Other ROM/Strengthening Exercises Lat pull down, progressed to 3 sets 2 plates.   Shoulder Exercises: Stretch   Other Shoulder Stretches supination and elbow extension stretches  stiff initially   Cryotherapy   Number  Minutes Cryotherapy 10 Minutes   Cryotherapy Location Shoulder   Type of Cryotherapy --  cold pack   Manual Therapy   Other Manual Therapy soft tisssue bork with ROC blade. neck clavicle portion of pecs scaleens deltoid                  PT Short Term Goals - 05/06/15 1533    PT SHORT TERM GOAL #1   Title "Independent with initial HEP   Status Achieved   PT SHORT TERM GOAL #2   Title "Report pain decrease from  5  /10 to   3 /10.   Status Achieved   PT SHORT TERM GOAL #3   Title "Demonstrate and verbalize understanding of condition management including RICE, positioning, HEP.    Status Achieved   PT SHORT TERM GOAL #4    Title Pt caregiver will be able to correctly execute PROM for Right shoulder for pt    Status Deferred           PT Long Term Goals - 06/03/15 1201    PT LONG TERM GOAL #1   Title "Pt will be independent with advanced HEP.    Time 16   Period Weeks   Status On-going   PT LONG TERM GOAL #2   Title "Pain will decrease to 1/10 with all functional activities   Baseline 3/10   Time 16   Period Weeks   Status On-going   PT LONG TERM GOAL #3   Title "R shoulder AROM scaption will improve to 0-120  degrees for improved overhead reaching to reach dishes and retrieve items from freezer.   Status Achieved   PT LONG TERM GOAL #4   Title "R shoulder AROM will return to [facility] to return to pain-free ADLs such as dressing and grooming   Baseline limited a little   Time 16   Period Weeks   Status On-going   PT LONG TERM GOAL #5   Title "FOTO will improve from  55%  to  37%   indicating improved functional mobility .    Time 16   Period Weeks   Status Unable to assess   PT LONG TERM GOAL #6   Title Pt will be able to drive without exacerbating pain and turn steering wheel for turns   Baseline has started driving uses Lt arm more   Time 16   Period Weeks   Status On-going   PT LONG TERM GOAL #7   Title Pt will increase AROM of Right shoulder in order to style hair independently   Time 16   Period Weeks   Status On-going               Plan - 06/03/15 1159    Clinical Impression Statement 2/10 pain post resisted reach, compensation frequent.  Able to use ranger for supra supinatus correctly.     PT Next Visit Plan strength,endurance, supraspinatus UE ranger, try sidelying   Consulted and Agree with Plan of Care Patient    FOTO next visit    Problem List Patient Active Problem List   Diagnosis Date Noted  . Complete tear of right rotator cuff 01/21/2015  . Severe obesity (BMI >= 40) 01/21/2015  . Nausea alone 04/04/2013  . H/O adenomatous polyp of colon 04/04/2013  .  FATTY LIVER DISEASE 11/11/2010  . RUQ PAIN 11/11/2010  . NONSPEC ELEVATION OF LEVELS OF TRANSAMINASE/LDH 11/11/2010  . COLONIC POLYPS, HYPERPLASTIC, HX OF 06/25/2010  . HYPOTHYROIDISM  06/24/2010  . ANEMIA-NOS 06/24/2010    HARRIS,KAREN 06/03/2015, 12:04 PM  [facility] [address] [place], [place], [address] Phone: [phone]   Fax:  [phone]  Melvenia Needles, PTA 06/03/2015 12:04 PM Phone: [phone] Fax: [phone]

## 2015-06-05 ENCOUNTER — Ambulatory Visit: Payer: Medicare Other | Admitting: Physical Therapy

## 2015-06-05 DIAGNOSIS — M25511 Pain in right shoulder: Secondary | ICD-10-CM

## 2015-06-05 DIAGNOSIS — M6281 Muscle weakness (generalized): Secondary | ICD-10-CM | POA: Diagnosis not present

## 2015-06-05 DIAGNOSIS — R531 Weakness: Secondary | ICD-10-CM

## 2015-06-05 DIAGNOSIS — M25611 Stiffness of right shoulder, not elsewhere classified: Secondary | ICD-10-CM

## 2015-06-05 DIAGNOSIS — Z9889 Other specified postprocedural states: Secondary | ICD-10-CM | POA: Diagnosis not present

## 2015-06-05 NOTE — Therapy (Signed)
[place], [place], [address] Phone: [phone]   Fax:  [phone]  Physical Therapy Treatment  Patient Details  Name: Cheryl [name] MRN: [id number] Date of Birth: 1946/09/06 Referring Provider:  Sharilyn Sites, MD  Encounter Date: 06/05/2015      PT End of Session - 06/05/15 1311    Visit Number 30   Number of Visits 32   Date for PT Re-Evaluation 06/19/15   PT Start Time 1105   PT Stop Time 1204   PT Time Calculation (min) 59 min   Activity Tolerance Patient tolerated treatment well   Behavior During Therapy [facility] for tasks assessed/performed      Past Medical History  Diagnosis Date  . Acid reflux   . Sleep apnea     had surgery to correct  . Sinus drainage   . Arthritis     Past Surgical History  Procedure Laterality Date  . Knee surgery      x2  . Rotator cuff repair      x2  . Trigger finger release    . Carpal tunnel release    . Colonoscopy  02/15/2005    [id number] small polyps ablated via cold biopsy, one from transverse colon and two from the rectum/Small external hemorrhoids  . Colonoscopy  07/10/2010    [id number] rectum/long redundant colon, polyps in the sigmoid, descending, hepatic flexure/ADENOMATOUS POLYPS. next TCS due 06/2015  . Esophagogastroduodenoscopy  1995    Gastritis  . Colonoscopy  1995    3 polyps, path revealed chronic colitis  . Esophagogastroduodenoscopy N/A 04/16/2013    Procedure: ESOPHAGOGASTRODUODENOSCOPY (EGD);  Surgeon: Daneil Dolin, MD;  [facility];  Service: Endoscopy;  Laterality: N/A;  3:30  . Uvulopalatopharyngoplasty    . Shoulder arthroscopy with rotator cuff repair and subacromial decompression Right 01/21/2015    Procedure: RIGHT SHOULDER ARTHROSCOPIC DEBRIDEMNT OF G-H JOINT AND REMOVAL OF LOOSE BODIES,ARTHROSCOPIC SUBACROMIAL DECOMPRESSION,MINI OPEN RCT REPAIR WITH SUPPLEMENTAL [facility] PATCH;  Surgeon: Garald Balding, MD;   Location: [place];  Service: Orthopedics;  Laterality: Right;    There were no vitals filed for this visit.  Visit Diagnosis:  Decreased ROM of right shoulder  Decreased strength  Pain in joint, shoulder region, right      Subjective Assessment - 06/05/15 1119    Subjective feels tired no pain   Pain Relieving Factors stretching with laying down                         [facility] Adult PT Treatment/Exercise - 06/05/15 1120    Shoulder Exercises: Supine   Horizontal ABduction 10 reps  2 sets L!   Theraband Level (Shoulder External Rotation) Level 1 (Yellow)  2 sets   Theraband Level (Shoulder Flexion) Level 1 (Yellow)  narrow grip L1  10 reps , wide wide grip 10 reps   Other Supine Exercises triceps 4 lbs 2 sets   Other Supine Exercises serratus punch 4 LBS 2 sets 10 X   Shoulder Exercises: Sidelying   External Rotation --  2 LBS 10 reps , 2 sets   Theraband Level (Shoulder External Rotation) --   ABduction 10 reps  2 sets with 2LBS   Other Sidelying Exercises ranger several circles reaching with supervision   Shoulder Exercises: Standing   Other Standing Exercises ranger serratus anterior work 20 reps guided initially.   Shoulder Exercises: Pulleys   Flexion --  20 minutes  to stretch into flexion   Cryotherapy   Number Minutes Cryotherapy 10 Minutes   Cryotherapy Location Shoulder   Type of Cryotherapy --  cold pack   Manual Therapy   Other Manual Therapy brief to supraspinatus/ upper trap                  PT Short Term Goals - 05/06/15 1533    PT SHORT TERM GOAL #1   Title "Independent with initial HEP   Status Achieved   PT SHORT TERM GOAL #2   Title "Report pain decrease from  5  /10 to   3 /10.   Status Achieved   PT SHORT TERM GOAL #3   Title "Demonstrate and verbalize understanding of condition management including RICE, positioning, HEP.    Status Achieved   PT SHORT TERM GOAL #4   Title Pt caregiver will be able to correctly  execute PROM for Right shoulder for pt    Status Deferred           PT Long Term Goals - 06/05/15 1718    PT LONG TERM GOAL #1   Title "Pt will be independent with advanced HEP.    Baseline independent with exercises given so far.   Period Weeks   Status On-going   PT LONG TERM GOAL #2   Title "Pain will decrease to 1/10 with all functional activities   Baseline 3/10   Time 16   Period Weeks   Status On-going   PT LONG TERM GOAL #3   Title "R shoulder AROM scaption will improve to 0-120  degrees for improved overhead reaching to reach dishes and retrieve items from freezer.   Baseline 110-120, varies   Time 16   Period Weeks   Status Achieved   PT LONG TERM GOAL #4   Title "R shoulder AROM will return to [facility] to return to pain-free ADLs such as dressing and grooming   Baseline limited a little   Time 16   Period Weeks   Status On-going   PT LONG TERM GOAL #5   Title "FOTO will improve from  55%  to  37%   indicating improved functional mobility .    Baseline 31% limitation   Time 16   Period Weeks   Status Achieved   PT LONG TERM GOAL #6   Title Pt will be able to drive without exacerbating pain and turn steering wheel for turns   Baseline fatigue vs pain   Time 16   Period Weeks   Status Achieved   PT LONG TERM GOAL #7   Title Pt will increase AROM of Right shoulder in order to style hair independently   Baseline 30 degress less flexion active.  Passive flexion equal   Period Weeks   Status On-going               Plan - 06/05/15 1721    Clinical Impression Statement --  FOTO redone with correct responce and score improved to 31% limitation        Problem List Patient Active Problem List   Diagnosis Date Noted  . Complete tear of right rotator cuff 01/21/2015  . Severe obesity (BMI >= 40) 01/21/2015  . Nausea alone 04/04/2013  . H/O adenomatous polyp of colon 04/04/2013  . FATTY LIVER DISEASE 11/11/2010  . RUQ PAIN 11/11/2010  . NONSPEC  ELEVATION OF LEVELS OF TRANSAMINASE/LDH 11/11/2010  . COLONIC POLYPS, HYPERPLASTIC, HX OF 06/25/2010  . HYPOTHYROIDISM 06/24/2010  .  ANEMIA-NOS 06/24/2010    Neli Fofana 06/05/2015, 5:23 PM  [facility] [address] [place], [place], [address] Phone: [phone]   Fax:  [phone]   Melvenia Needles, PTA 06/05/2015 5:23 PM Phone: [phone] Fax: [phone]

## 2015-06-09 NOTE — Telephone Encounter (Signed)
ERror

## 2015-06-10 ENCOUNTER — Ambulatory Visit: Payer: Medicare Other | Admitting: Physical Therapy

## 2015-06-10 DIAGNOSIS — M6281 Muscle weakness (generalized): Secondary | ICD-10-CM | POA: Diagnosis not present

## 2015-06-10 DIAGNOSIS — R531 Weakness: Secondary | ICD-10-CM

## 2015-06-10 DIAGNOSIS — Z9889 Other specified postprocedural states: Secondary | ICD-10-CM | POA: Diagnosis not present

## 2015-06-10 DIAGNOSIS — M25511 Pain in right shoulder: Secondary | ICD-10-CM

## 2015-06-10 DIAGNOSIS — M25611 Stiffness of right shoulder, not elsewhere classified: Secondary | ICD-10-CM

## 2015-06-10 NOTE — Therapy (Signed)
[place], [place], [address] Phone: [phone]   Fax:  [phone]  Physical Therapy Treatment  Patient Details  Name: Cheryl [name] MRN: [id number] Date of Birth: August 17, 1946 Referring Provider:  Sharilyn Sites, MD  Encounter Date: 06/10/2015  Visit number 31 Number of visits 32 Date of re-evaluation 06/19/2015      PT End of Session - 06/10/15 1324    PT Start Time 3435   PT Stop Time 1205   PT Time Calculation (min) 60 min   Activity Tolerance Patient tolerated treatment well      Past Medical History  Diagnosis Date  . Acid reflux   . Sleep apnea     had surgery to correct  . Sinus drainage   . Arthritis     Past Surgical History  Procedure Laterality Date  . Knee surgery      x2  . Rotator cuff repair      x2  . Trigger finger release    . Carpal tunnel release    . Colonoscopy  02/15/2005    WYS:HUOHF small polyps ablated via cold biopsy, one from transverse colon and two from the rectum/Small external hemorrhoids  . Colonoscopy  07/10/2010    GBM:SXJDBZ rectum/long redundant colon, polyps in the sigmoid, descending, hepatic flexure/ADENOMATOUS POLYPS. next TCS due 06/2015  . Esophagogastroduodenoscopy  1995    Gastritis  . Colonoscopy  1995    3 polyps, path revealed chronic colitis  . Esophagogastroduodenoscopy N/A 04/16/2013    Procedure: ESOPHAGOGASTRODUODENOSCOPY (EGD);  Surgeon: Daneil Dolin, MD;  [facility];  Service: Endoscopy;  Laterality: N/A;  3:30  . Uvulopalatopharyngoplasty    . Shoulder arthroscopy with rotator cuff repair and subacromial decompression Right 01/21/2015    Procedure: RIGHT SHOULDER ARTHROSCOPIC DEBRIDEMNT OF G-H JOINT AND REMOVAL OF LOOSE BODIES,ARTHROSCOPIC SUBACROMIAL DECOMPRESSION,MINI OPEN RCT REPAIR WITH SUPPLEMENTAL [facility] PATCH;  Surgeon: Garald Balding, MD;  Location: [place];  Service: Orthopedics;  Laterality: Right;    There  were no vitals filed for this visit.  Visit Diagnosis:  Decreased ROM of right shoulder  Decreased strength  Pain in joint, shoulder region, right      Subjective Assessment - 06/10/15 1105    Subjective Ached all day yesterday.   Into arm.  Not pain. Woke up that way. Today is ok. Drove here today.   Currently in Pain? No/denies                         [facility] Adult PT Treatment/Exercise - 06/10/15 1107    Shoulder Exercises: Standing   External Rotation 10 reps   Internal Rotation Weight (lbs) 4   Flexion --  1o nreps   Theraband Level (Shoulder Extension) --  4 LBS with cane 10 reps   Shoulder Exercises: Pulleys   Flexion --  20 reps   Shoulder Exercises: ROM/Strengthening   UBE (Upper Arm Bike) Arm Bike L4 3 minutes each way   Other ROM/Strengthening Exercises Lat pull down 2 plates 20 reps, SBA   Other ROM/Strengthening Exercises reach behind to take and give ball. 5 reps difficult   Cryotherapy   Number Minutes Cryotherapy 10 Minutes   Cryotherapy Location Shoulder   Type of Cryotherapy --  cold pack   Manual Therapy   Other Manual Therapy trigger point release supraspinatus, upper trap tissue softened.  PT Short Term Goals - 05/06/15 1533    PT SHORT TERM GOAL #1   Title "Independent with initial HEP   Status Achieved   PT SHORT TERM GOAL #2   Title "Report pain decrease from  5  /10 to   3 /10.   Status Achieved   PT SHORT TERM GOAL #3   Title "Demonstrate and verbalize understanding of condition management including RICE, positioning, HEP.    Status Achieved   PT SHORT TERM GOAL #4   Title Pt caregiver will be able to correctly execute PROM for Right shoulder for pt    Status Deferred           PT Long Term Goals - 06/10/15 1327    PT LONG TERM GOAL #1   Title "Pt will be independent with advanced HEP.    Baseline independent with exercises given so far.   Time 16   Period Weeks   Status On-going   PT  LONG TERM GOAL #2   Title "Pain will decrease to 1/10 with all functional activities   Baseline 3-4/10   Time 16   Status On-going   PT LONG TERM GOAL #3   Title "R shoulder AROM scaption will improve to 0-120  degrees for improved overhead reaching to reach dishes and retrieve items from freezer.   Baseline 110-120, varies   Time 16   Period Weeks   Status Achieved   PT LONG TERM GOAL #4   Title "R shoulder AROM will return to [facility] to return to pain-free ADLs such as dressing and grooming   Baseline No dressing issues , no grooming issues. cannot put barrett in back of hair   Time 16   Period Weeks   Status Partially Met   PT LONG TERM GOAL #5   Title "FOTO will improve from  55%  to  37%   indicating improved functional mobility .    Baseline 31% limitation   Status Achieved   PT LONG TERM GOAL #6   Baseline fatigue vs pain   Time 16   Period Weeks   Status Achieved   PT LONG TERM GOAL #7   Title Pt will increase AROM of Right shoulder in order to style hair independently   Baseline Cannot style hair independently, hard to place a barrett in back of head.    Time 16   Period Weeks   Status On-going               Plan - 06/10/15 1325    Clinical Impression Statement Patient wants another DN, She see's MD next week and she has a complete home exercise program.  Limited with hair care behind her head, and has pain intermittant, 3 to 4/10 with ADL.  She will continue to strengthen overtime.    PT Next Visit Plan ERO.  Finalize home exercise?  1 more visit in POC        Problem List Patient Active Problem List   Diagnosis Date Noted  . Complete tear of right rotator cuff 01/21/2015  . Severe obesity (BMI >= 40) 01/21/2015  . Nausea alone 04/04/2013  . H/O adenomatous polyp of colon 04/04/2013  . FATTY LIVER DISEASE 11/11/2010  . RUQ PAIN 11/11/2010  . NONSPEC ELEVATION OF LEVELS OF TRANSAMINASE/LDH 11/11/2010  . COLONIC POLYPS, HYPERPLASTIC, HX OF 06/25/2010   . HYPOTHYROIDISM 06/24/2010  . ANEMIA-NOS 06/24/2010    HARRIS,KAREN 06/10/2015, 1:31 PM  [place] [facility]  [place], [place], [address] Phone: [phone]   Fax:  [phone]    Melvenia Needles, PTA 06/10/2015 1:31 PM Phone: [phone] Fax: [phone]

## 2015-06-12 ENCOUNTER — Ambulatory Visit: Payer: Medicare Other | Admitting: Physical Therapy

## 2015-06-12 DIAGNOSIS — Z9889 Other specified postprocedural states: Secondary | ICD-10-CM

## 2015-06-12 DIAGNOSIS — M25511 Pain in right shoulder: Secondary | ICD-10-CM

## 2015-06-12 DIAGNOSIS — M6281 Muscle weakness (generalized): Secondary | ICD-10-CM | POA: Diagnosis not present

## 2015-06-12 DIAGNOSIS — M25611 Stiffness of right shoulder, not elsewhere classified: Secondary | ICD-10-CM

## 2015-06-12 DIAGNOSIS — R531 Weakness: Secondary | ICD-10-CM

## 2015-06-12 NOTE — Therapy (Signed)
[place], [place], [address] Phone: [phone]   Fax:  [phone]  Physical Therapy Treatment/Recertification  Patient Details  Name: DELENE MORAIS MRN: [id number] Date of Birth: 09-04-1946 Referring Provider:  Sharilyn Sites, MD  Encounter Date: 06/12/2015      PT End of Session - 06/12/15 1230    Visit Number 32   Number of Visits 42   Date for PT Re-Evaluation 07/17/15   Authorization Type Medicare G codes-needs KX modifiers   PT Start Time 1100   PT Stop Time 1205   PT Time Calculation (min) 65 min   Activity Tolerance Patient tolerated treatment well      Past Medical History  Diagnosis Date  . Acid reflux   . Sleep apnea     had surgery to correct  . Sinus drainage   . Arthritis     Past Surgical History  Procedure Laterality Date  . Knee surgery      x2  . Rotator cuff repair      x2  . Trigger finger release    . Carpal tunnel release    . Colonoscopy  02/15/2005    GGE:ZMOQH small polyps ablated via cold biopsy, one from transverse colon and two from the rectum/Small external hemorrhoids  . Colonoscopy  07/10/2010    UTM:LYYTKP rectum/long redundant colon, polyps in the sigmoid, descending, hepatic flexure/ADENOMATOUS POLYPS. next TCS due 06/2015  . Esophagogastroduodenoscopy  1995    Gastritis  . Colonoscopy  1995    3 polyps, path revealed chronic colitis  . Esophagogastroduodenoscopy N/A 04/16/2013    Procedure: ESOPHAGOGASTRODUODENOSCOPY (EGD);  Surgeon: Daneil Dolin, MD;  [facility];  Service: Endoscopy;  Laterality: N/A;  3:30  . Uvulopalatopharyngoplasty    . Shoulder arthroscopy with rotator cuff repair and subacromial decompression Right 01/21/2015    Procedure: RIGHT SHOULDER ARTHROSCOPIC DEBRIDEMNT OF G-H JOINT AND REMOVAL OF LOOSE BODIES,ARTHROSCOPIC SUBACROMIAL DECOMPRESSION,MINI OPEN RCT REPAIR WITH SUPPLEMENTAL [facility] PATCH;  Surgeon: Garald Balding, MD;  Location: [place];  Service: Orthopedics;  Laterality: Right;    There were no vitals filed for this visit.  Visit Diagnosis:  Decreased ROM of right shoulder - Plan: PT plan of care cert/re-cert  Decreased strength - Plan: PT plan of care cert/re-cert  Pain in joint, shoulder region, right - Plan: PT plan of care cert/re-cert  S/P arthroscopy of shoulder - Plan: PT plan of care cert/re-cert  S/P rotator cuff repair - Plan: PT plan of care cert/re-cert      Subjective Assessment - 06/12/15 1104    Subjective Going to see Dr. Durward Fortes on Monday.  It's been a month since he saw me.  My goal is to finish up with PT this month.  I still make gains every day.  Difficulty overhead and behind the head.  I can't wash my head yet.     Currently in Pain? No/denies   Pain Location --  right upper trap and shoulder blade   Pain Orientation Right            [facility] PT Assessment - 06/12/15 0001    AROM   Right Shoulder Flexion 144 Degrees  with some compensation   Right Shoulder ABduction 106 Degrees   Right Shoulder Internal Rotation 54 Degrees   Right Shoulder External Rotation --  right iliac crest   Strength   Right Shoulder Flexion 3/5   Right Shoulder ABduction 3/5   Right Shoulder  Internal Rotation 3+/5   Right Shoulder External Rotation 3+/5                     [facility] Adult PT Treatment/Exercise - 06/12/15 0001    Shoulder Exercises: Standing   Flexion AROM  right UE on high surface small arc lifts (place/holds) 5x 5    Other Standing Exercises UE Ranger on floor and wall mount flexion L12,14,16 10x each and combo IR/scaption 10x on L16;  IR with UE Ranger 10x   Other Standing Exercises UE Ranger supraspinatus neuromuscular 10x   Moist Heat Therapy   Number Minutes Moist Heat 10 Minutes   Moist Heat Location Shoulder   Manual Therapy   Manual Therapy Soft tissue mobilization   Soft tissue mobilization right upper traps, levator scap,  infraspinatus, rhomboids          Trigger Point Dry Needling - 06/12/15 1229    Consent Given? Yes   Education Handout Provided --  reviewed risk of pneumothorax, signs/symptoms   Muscles Treated Upper Body Upper trapezius;Levator scapulae;Rhomboids;Infraspinatus   Upper Trapezius Response Twitch reponse elicited;Palpable increased muscle length   Levator Scapulae Response Twitch response elicited;Palpable increased muscle length   Rhomboids Response Palpable increased muscle length   Infraspinatus Response Palpable increased muscle length     Right side only.           PT Short Term Goals - 06/12/15 1250    PT SHORT TERM GOAL #1   Title "Independent with initial HEP   Status Achieved   PT SHORT TERM GOAL #2   Title "Report pain decrease from  5  /10 to   3 /10.   Status Achieved   PT SHORT TERM GOAL #3   Title "Demonstrate and verbalize understanding of condition management including RICE, positioning, HEP.    Status Achieved   PT SHORT TERM GOAL #4   Title Pt caregiver will be able to correctly execute PROM for Right shoulder for pt    Status Deferred           PT Long Term Goals - 06/12/15 1250    PT LONG TERM GOAL #1   Title "Pt will be independent with advanced HEP.    Time 6   Period Weeks   Status On-going   PT LONG TERM GOAL #2   Title "Pain will decrease to 1/10 with all functional activities   Time 6   Period Weeks   Status Partially Met   PT LONG TERM GOAL #3   Title "R shoulder AROM scaption will improve to 0-120  degrees for improved overhead reaching to reach dishes and retrieve items from freezer.   Status Achieved   PT LONG TERM GOAL #4   Title "R shoulder AROM will return to [facility] to return to pain-free ADLs such as dressing and grooming   Time 6   Period Weeks   Status Partially Met   PT LONG TERM GOAL #5   Title "FOTO will improve from  55%  to  37%   indicating improved functional mobility .    Status Achieved   PT LONG TERM GOAL #6    Title Pt will be able to drive without exacerbating pain and turn steering wheel for turns   Status Achieved   PT LONG TERM GOAL #7   Title Pt will increase AROM of Right shoulder in order to style hair independently   Time 6   Period Weeks   Status On-going  Plan - 06/12/15 1231    Clinical Impression Statement The patient has made good improvements in shoulder AROM.  In supine with gravity assisting, she has full flexion.  Against gravity she has improved to:  flex 144 degrees, abd 106, ER to 54, IR to right iliac crest with some compensatory upper trap hike with overhead reaching.  She is still considerably weak, with shoulder flex and abd strength 3/5, IR/ER 3+/5.  Although she has functional improvements with driving and many other ADLs, she cannot wash her head or put in a hair clip yet.  She would benefit from PT with focus on strengthening .  She reports benefit from dry needling for myofascial tender points in upper traps, levators, rhomboids and infraspinatus.  May decrease frequency to 1x/week in July as Ms. Hy becomes more independent with HEP and safe self progression.     Pt will benefit from skilled therapeutic intervention in order to improve on the following deficits Decreased activity tolerance;Decreased range of motion;Decreased strength;Pain;Increased muscle spasms   Rehab Potential Good   PT Frequency 2x / week   PT Duration 6 weeks  5 weeks   PT Treatment/Interventions ADLs/Self Care Home Management;Cryotherapy;Electrical Stimulation;Ultrasound;Moist Heat;Functional mobility training;Neuromuscular re-education;Therapeutic exercise;Therapeutic activities;Patient/family education;Manual techniques;Dry needling;Passive range of motion;Scar mobilization   PT Next Visit Plan Continue with strengthening > 90 degrees, functional strengthening, dry needling PRN; promote independence with HEP with planned discharge in 4-6 visits.  KX modifiers needed.         Problem List Patient Active Problem List   Diagnosis Date Noted  . Complete tear of right rotator cuff 01/21/2015  . Severe obesity (BMI >= 40) 01/21/2015  . Nausea alone 04/04/2013  . H/O adenomatous polyp of colon 04/04/2013  . FATTY LIVER DISEASE 11/11/2010  . RUQ PAIN 11/11/2010  . NONSPEC ELEVATION OF LEVELS OF TRANSAMINASE/LDH 11/11/2010  . COLONIC POLYPS, HYPERPLASTIC, HX OF 06/25/2010  . HYPOTHYROIDISM 06/24/2010  . ANEMIA-NOS 06/24/2010    Alvera Singh 06/12/2015, 12:58 PM  [facility] [address] [place], [place], [address] Phone: [phone]   Fax:  [phone]   Ruben Im, PT 06/12/2015 12:59 PM Phone: [phone] Fax: [phone]

## 2015-06-16 DIAGNOSIS — M75121 Complete rotator cuff tear or rupture of right shoulder, not specified as traumatic: Secondary | ICD-10-CM | POA: Diagnosis not present

## 2015-06-16 DIAGNOSIS — M24011 Loose body in right shoulder: Secondary | ICD-10-CM | POA: Diagnosis not present

## 2015-06-16 DIAGNOSIS — M19011 Primary osteoarthritis, right shoulder: Secondary | ICD-10-CM | POA: Diagnosis not present

## 2015-06-16 DIAGNOSIS — M7541 Impingement syndrome of right shoulder: Secondary | ICD-10-CM | POA: Diagnosis not present

## 2015-06-17 ENCOUNTER — Ambulatory Visit: Payer: Medicare Other | Admitting: Physical Therapy

## 2015-06-17 DIAGNOSIS — M6281 Muscle weakness (generalized): Secondary | ICD-10-CM | POA: Diagnosis not present

## 2015-06-17 DIAGNOSIS — M25511 Pain in right shoulder: Secondary | ICD-10-CM | POA: Diagnosis not present

## 2015-06-17 DIAGNOSIS — M25611 Stiffness of right shoulder, not elsewhere classified: Secondary | ICD-10-CM

## 2015-06-17 DIAGNOSIS — Z9889 Other specified postprocedural states: Secondary | ICD-10-CM | POA: Diagnosis not present

## 2015-06-17 DIAGNOSIS — R531 Weakness: Secondary | ICD-10-CM

## 2015-06-17 NOTE — Therapy (Signed)
[place], [place], [address] Phone: [phone]   Fax:  [phone]  Physical Therapy Treatment  Patient Details  Name: Cheryl [name] MRN: [id number] Date of Birth: 05-Sep-1946 Referring Provider:  Sharilyn Sites, MD  Encounter Date: 06/17/2015      PT End of Session - 06/17/15 1537    Visit Number 33   Number of Visits 42   Date for PT Re-Evaluation 07/17/15   Authorization Type Medicare G codes-needs KX modifiers   PT Start Time 1100   PT Stop Time 1200   PT Time Calculation (min) 60 min   Activity Tolerance Patient tolerated treatment well      Past Medical History  Diagnosis Date  . Acid reflux   . Sleep apnea     had surgery to correct  . Sinus drainage   . Arthritis     Past Surgical History  Procedure Laterality Date  . Knee surgery      x2  . Rotator cuff repair      x2  . Trigger finger release    . Carpal tunnel release    . Colonoscopy  02/15/2005    QKM:MNOTR small polyps ablated via cold biopsy, one from transverse colon and two from the rectum/Small external hemorrhoids  . Colonoscopy  07/10/2010    RNH:AFBXUX rectum/long redundant colon, polyps in the sigmoid, descending, hepatic flexure/ADENOMATOUS POLYPS. next TCS due 06/2015  . Esophagogastroduodenoscopy  1995    Gastritis  . Colonoscopy  1995    3 polyps, path revealed chronic colitis  . Esophagogastroduodenoscopy N/A 04/16/2013    Procedure: ESOPHAGOGASTRODUODENOSCOPY (EGD);  Surgeon: Daneil Dolin, MD;  [facility];  Service: Endoscopy;  Laterality: N/A;  3:30  . Uvulopalatopharyngoplasty    . Shoulder arthroscopy with rotator cuff repair and subacromial decompression Right 01/21/2015    Procedure: RIGHT SHOULDER ARTHROSCOPIC DEBRIDEMNT OF G-H JOINT AND REMOVAL OF LOOSE BODIES,ARTHROSCOPIC SUBACROMIAL DECOMPRESSION,MINI OPEN RCT REPAIR WITH SUPPLEMENTAL [facility] PATCH;  Surgeon: Garald Balding, MD;   Location: [place];  Service: Orthopedics;  Laterality: Right;    There were no vitals filed for this visit.  Visit Diagnosis:  Decreased ROM of right shoulder  Decreased strength  Pain in joint, shoulder region, right  S/P arthroscopy of shoulder  S/P rotator cuff repair      Subjective Assessment - 06/17/15 1111    Subjective Reports the doctor was pleased.  He was able to lift it and move it all the way back.  He approved finishing up in July and follow up with him in 2 months.  Able to drive now.  Had some spasms up my neck for 1 day after following last treatment.     Currently in Pain? No/denies   Pain Score 0-No pain   Pain Orientation Right                         [facility] Adult PT Treatment/Exercise - 06/17/15 1131    Shoulder Exercises: Supine   Flexion Right;10 reps;Weights  Alphabet writing on ceiling; small arc flex and HABD/HADD   Shoulder Flexion Weight (lbs) 2   ABduction Strengthening;Right;10 reps;Weights   Shoulder ABduction Weight (lbs) 2   Shoulder Exercises: Sidelying   External Rotation Right;Strengthening;15 reps;Weights   External Rotation Weight (lbs) 2   Shoulder Exercises: Standing   Protraction Strengthening;Right;15 reps;Weights  scaption on wall 1# weight   Flexion AAROM;Right;10 reps;Other (comment)  Other Standing Exercises UE Ranger on floor and wall mount flexion L12,14,16 10x each and combo IR/scaption 10x on L16;  IR with UE Ranger 10x   Other Standing Exercises UE Ranger supraspinatus neuromuscular 10x   Moist Heat Therapy   Number Minutes Moist Heat 10 Minutes  seated   Moist Heat Location Shoulder   Manual Therapy   Soft tissue mobilization right upper traps, levator scap, infraspinatus, rhomboids          Trigger Point Dry Needling - 06/17/15 1536    Consent Given? Yes   Muscles Treated Upper Body Upper trapezius;Levator scapulae;Rhomboids;Subscapularis;Infraspinatus   Upper Trapezius Response Twitch reponse  elicited;Palpable increased muscle length   Levator Scapulae Response Twitch response elicited;Palpable increased muscle length   Rhomboids Response Palpable increased muscle length   Infraspinatus Response Palpable increased muscle length   Subscapularis Response Palpable increased muscle length       Right side only           PT Short Term Goals - 06/17/15 1540    PT SHORT TERM GOAL #1   Title "Independent with initial HEP   Status Achieved   PT SHORT TERM GOAL #2   Title "Report pain decrease from  5  /10 to   3 /10.   Status Achieved   PT SHORT TERM GOAL #3   Title "Demonstrate and verbalize understanding of condition management including RICE, positioning, HEP.    Status Achieved   PT SHORT TERM GOAL #4   Title Pt caregiver will be able to correctly execute PROM for Right shoulder for pt    Status Deferred           PT Long Term Goals - 06/17/15 1540    PT LONG TERM GOAL #1   Title "Pt will be independent with advanced HEP.    Time 6   Period Weeks   Status On-going   PT LONG TERM GOAL #2   Title "Pain will decrease to 1/10 with all functional activities   Time 6   Period Weeks   Status Partially Met   PT LONG TERM GOAL #3   Title "R shoulder AROM scaption will improve to 0-120  degrees for improved overhead reaching to reach dishes and retrieve items from freezer.   Status Achieved   PT LONG TERM GOAL #4   Title "R shoulder AROM will return to [facility] to return to pain-free ADLs such as dressing and grooming   Time 6   Period Weeks   Status Partially Met   PT LONG TERM GOAL #5   Title "FOTO will improve from  55%  to  37%   indicating improved functional mobility .    Status Achieved   PT LONG TERM GOAL #6   Title Pt will be able to drive without exacerbating pain and turn steering wheel for turns   Status Achieved   PT LONG TERM GOAL #7   Title Pt will increase AROM of Right shoulder in order to style hair independently   Time 6   Period Weeks    Status On-going               Plan - 06/17/15 1537    Clinical Impression Statement The patient reports Dr. Durward Fortes was pleased with her progress.  She reports she is doing better functionally and is able to drive herself to church but lacks strength to fix her hair and with overhead activities.  Decreased trigger point size and number, patient  states , "I always feel better after the needling."  Continue with strengthening especially > 90 degrees.  Verbal and tactile cues to decrease upper trap compensation.    PT Next Visit Plan Continue with strengthening > 90 degrees, functional strengthening, dry needling PRN; promote independence with HEP with planned discharge in 4-6 visits.  KX modifiers needed.        Problem List Patient Active Problem List   Diagnosis Date Noted  . Complete tear of right rotator cuff 01/21/2015  . Severe obesity (BMI >= 40) 01/21/2015  . Nausea alone 04/04/2013  . H/O adenomatous polyp of colon 04/04/2013  . FATTY LIVER DISEASE 11/11/2010  . RUQ PAIN 11/11/2010  . NONSPEC ELEVATION OF LEVELS OF TRANSAMINASE/LDH 11/11/2010  . COLONIC POLYPS, HYPERPLASTIC, HX OF 06/25/2010  . HYPOTHYROIDISM 06/24/2010  . ANEMIA-NOS 06/24/2010    Alvera Singh 06/17/2015, 3:43 PM  [facility] [address] [place], [place], [address] Phone: [phone]   Fax:  [phone]   Ruben Im, PT 06/17/2015 3:44 PM Phone: [phone] Fax: [phone]

## 2015-06-18 DIAGNOSIS — M84374A Stress fracture, right foot, initial encounter for fracture: Secondary | ICD-10-CM | POA: Diagnosis not present

## 2015-06-19 ENCOUNTER — Ambulatory Visit: Payer: Medicare Other | Admitting: Physical Therapy

## 2015-06-19 DIAGNOSIS — M25511 Pain in right shoulder: Secondary | ICD-10-CM | POA: Diagnosis not present

## 2015-06-19 DIAGNOSIS — Z9889 Other specified postprocedural states: Secondary | ICD-10-CM | POA: Diagnosis not present

## 2015-06-19 DIAGNOSIS — M6281 Muscle weakness (generalized): Secondary | ICD-10-CM | POA: Diagnosis not present

## 2015-06-19 DIAGNOSIS — M25611 Stiffness of right shoulder, not elsewhere classified: Secondary | ICD-10-CM

## 2015-06-19 NOTE — Therapy (Signed)
[place], [place], [address] Phone: [phone]   Fax:  [phone]  Physical Therapy Treatment  Patient Details  Name: Cheryl [name] MRN: [id number] Date of Birth: 1946-08-30 Referring Provider:  Sharilyn Sites, MD  Encounter Date: 06/19/2015      PT End of Session - 06/19/15 1309    Visit Number 34   Number of Visits 42   Date for PT Re-Evaluation 07/17/15   PT Start Time 1103   PT Stop Time 1200   PT Time Calculation (min) 57 min   Activity Tolerance Patient tolerated treatment well   Behavior During Therapy [facility] for tasks assessed/performed      Past Medical History  Diagnosis Date  . Acid reflux   . Sleep apnea     had surgery to correct  . Sinus drainage   . Arthritis     Past Surgical History  Procedure Laterality Date  . Knee surgery      x2  . Rotator cuff repair      x2  . Trigger finger release    . Carpal tunnel release    . Colonoscopy  02/15/2005    TXH:FSFSE small polyps ablated via cold biopsy, one from transverse colon and two from the rectum/Small external hemorrhoids  . Colonoscopy  07/10/2010    LTR:VUYEBX rectum/long redundant colon, polyps in the sigmoid, descending, hepatic flexure/ADENOMATOUS POLYPS. next TCS due 06/2015  . Esophagogastroduodenoscopy  1995    Gastritis  . Colonoscopy  1995    3 polyps, path revealed chronic colitis  . Esophagogastroduodenoscopy N/A 04/16/2013    Procedure: ESOPHAGOGASTRODUODENOSCOPY (EGD);  Surgeon: Daneil Dolin, MD;  [facility];  Service: Endoscopy;  Laterality: N/A;  3:30  . Uvulopalatopharyngoplasty    . Shoulder arthroscopy with rotator cuff repair and subacromial decompression Right 01/21/2015    Procedure: RIGHT SHOULDER ARTHROSCOPIC DEBRIDEMNT OF G-H JOINT AND REMOVAL OF LOOSE BODIES,ARTHROSCOPIC SUBACROMIAL DECOMPRESSION,MINI OPEN RCT REPAIR WITH SUPPLEMENTAL [facility] PATCH;  Surgeon: Garald Balding, MD;   Location: [place];  Service: Orthopedics;  Laterality: Right;    There were no vitals filed for this visit.  Visit Diagnosis:  Decreased ROM of right shoulder  Pain in joint, shoulder region, right      Subjective Assessment - 06/19/15 1258    Subjective Shoulder painful this morning, less now.  Wearing a boot due to having a "Stressed bone" in her foot .  If it breaks she will need surgery.  Shoulder ER reaching behind her head still difficult  DN last visit helped a lot.     Pain Score --  mild, no number given   Pain Location Shoulder   Pain Orientation Right   Pain Descriptors / Indicators Aching;Sore   Pain Radiating Towards neck   Aggravating Factors  reaching behind head   Pain Relieving Factors cold, stretching   Multiple Pain Sites No                         [facility] Adult PT Treatment/Exercise - 06/19/15 1120    Shoulder Exercises: Seated   Theraband Level (Shoulder Extension) --  black band 10 X   Row 10 reps  black band 1 arm   Theraband Level (Shoulder Protraction) --  black band 10 X   External Rotation --  black band 10 X   Internal Rotation --  black band 10 X   Flexion Right;10 reps  Abduction AAROM;Right;10 reps   Other Seated Exercises red band pull down from overhead 10 reps   Shoulder Exercises: Standing   Theraband Level (Shoulder Internal Rotation) --  10   Internal Rotation Weight (lbs) 5   Extension 10 reps   Extension Weight (lbs) 5   Shoulder Exercises: ROM/Strengthening   UBE (Upper Arm Bike) 3 minutes each  L 4   Cryotherapy   Number Minutes Cryotherapy 10 Minutes   Cryotherapy Location Shoulder   Type of Cryotherapy --  cold pack   Manual Therapy   Manual Therapy Joint mobilization;Soft tissue mobilization;Passive ROM   Passive ROM ER   Other Manual Therapy soft tissue work Rt shoulder musculature                  PT Short Term Goals - 06/17/15 1540    PT SHORT TERM GOAL #1   Title "Independent with  initial HEP   Status Achieved   PT SHORT TERM GOAL #2   Title "Report pain decrease from  5  /10 to   3 /10.   Status Achieved   PT SHORT TERM GOAL #3   Title "Demonstrate and verbalize understanding of condition management including RICE, positioning, HEP.    Status Achieved   PT SHORT TERM GOAL #4   Title Pt caregiver will be able to correctly execute PROM for Right shoulder for pt    Status Deferred           PT Long Term Goals - 06/17/15 1540    PT LONG TERM GOAL #1   Title "Pt will be independent with advanced HEP.    Time 6   Period Weeks   Status On-going   PT LONG TERM GOAL #2   Title "Pain will decrease to 1/10 with all functional activities   Time 6   Period Weeks   Status Partially Met   PT LONG TERM GOAL #3   Title "R shoulder AROM scaption will improve to 0-120  degrees for improved overhead reaching to reach dishes and retrieve items from freezer.   Status Achieved   PT LONG TERM GOAL #4   Title "R shoulder AROM will return to [facility] to return to pain-free ADLs such as dressing and grooming   Time 6   Period Weeks   Status Partially Met   PT LONG TERM GOAL #5   Title "FOTO will improve from  55%  to  37%   indicating improved functional mobility .    Status Achieved   PT LONG TERM GOAL #6   Title Pt will be able to drive without exacerbating pain and turn steering wheel for turns   Status Achieved   PT LONG TERM GOAL #7   Title Pt will increase AROM of Right shoulder in order to style hair independently   Time 6   Period Weeks   Status On-going               Plan - 06/19/15 1309    Clinical Impression Statement limited exercise standing due to new foot issues.  Will continue to strengthen >90 . 120 degrees flexion with less hike, AA.   PT Next Visit Plan Continue with strengthening > 90 degrees, functional strengthening, dry needling PRN; promote independence with HEP with planned discharge in 4-6 visits.  KX modifiers needed.   Consulted and  Agree with Plan of Care Patient        Problem List Patient Active Problem List  Diagnosis Date Noted  . Complete tear of right rotator cuff 01/21/2015  . Severe obesity (BMI >= 40) 01/21/2015  . Nausea alone 04/04/2013  . H/O adenomatous polyp of colon 04/04/2013  . FATTY LIVER DISEASE 11/11/2010  . RUQ PAIN 11/11/2010  . NONSPEC ELEVATION OF LEVELS OF TRANSAMINASE/LDH 11/11/2010  . COLONIC POLYPS, HYPERPLASTIC, HX OF 06/25/2010  . HYPOTHYROIDISM 06/24/2010  . ANEMIA-NOS 06/24/2010    Deshon Hsiao 06/19/2015, 1:21 PM  [facility] [address] [place], [place], [address] Phone: [phone]   Fax:  [phone]     Melvenia Needles, PTA 06/19/2015 1:21 PM Phone: [phone] Fax: [phone]

## 2015-06-25 ENCOUNTER — Ambulatory Visit: Payer: Medicare Other | Attending: Orthopaedic Surgery | Admitting: Physical Therapy

## 2015-06-25 DIAGNOSIS — M25511 Pain in right shoulder: Secondary | ICD-10-CM | POA: Insufficient documentation

## 2015-06-25 DIAGNOSIS — M6281 Muscle weakness (generalized): Secondary | ICD-10-CM | POA: Insufficient documentation

## 2015-07-01 ENCOUNTER — Ambulatory Visit: Payer: Medicare Other | Admitting: Physical Therapy

## 2015-07-01 DIAGNOSIS — R531 Weakness: Secondary | ICD-10-CM

## 2015-07-01 DIAGNOSIS — M25511 Pain in right shoulder: Secondary | ICD-10-CM | POA: Diagnosis not present

## 2015-07-01 DIAGNOSIS — M25611 Stiffness of right shoulder, not elsewhere classified: Secondary | ICD-10-CM

## 2015-07-01 DIAGNOSIS — M6281 Muscle weakness (generalized): Secondary | ICD-10-CM | POA: Diagnosis not present

## 2015-07-01 NOTE — Therapy (Signed)
[place], [place], [address] Phone: [phone]   Fax:  [phone]  Physical Therapy Treatment  Patient Details  Name: Cheryl [name] MRN: [id number] Date of Birth: 01/30/1946 Referring Provider:  Sharilyn Sites, MD  Encounter Date: 07/01/2015      PT End of Session - 07/01/15 1145    Visit Number 35   Number of Visits 42   Date for PT Re-Evaluation 07/17/15   PT Start Time 1110   PT Stop Time 1155   PT Time Calculation (min) 45 min   Activity Tolerance Patient tolerated treatment well   Behavior During Therapy [facility] for tasks assessed/performed      Past Medical History  Diagnosis Date  . Acid reflux   . Sleep apnea     had surgery to correct  . Sinus drainage   . Arthritis     Past Surgical History  Procedure Laterality Date  . Knee surgery      x2  . Rotator cuff repair      x2  . Trigger finger release    . Carpal tunnel release    . Colonoscopy  02/15/2005    KDX:IPJAS small polyps ablated via cold biopsy, one from transverse colon and two from the rectum/Small external hemorrhoids  . Colonoscopy  07/10/2010    NKN:[id number] rectum/long redundant colon, polyps in the sigmoid, descending, hepatic flexure/ADENOMATOUS POLYPS. next TCS due 06/2015  . Esophagogastroduodenoscopy  1995    Gastritis  . Colonoscopy  1995    3 polyps, path revealed chronic colitis  . Esophagogastroduodenoscopy N/A 04/16/2013    Procedure: ESOPHAGOGASTRODUODENOSCOPY (EGD);  Surgeon: Daneil Dolin, MD;  [facility];  Service: Endoscopy;  Laterality: N/A;  3:30  . Uvulopalatopharyngoplasty    . Shoulder arthroscopy with rotator cuff repair and subacromial decompression Right 01/21/2015    Procedure: RIGHT SHOULDER ARTHROSCOPIC DEBRIDEMNT OF G-H JOINT AND REMOVAL OF LOOSE BODIES,ARTHROSCOPIC SUBACROMIAL DECOMPRESSION,MINI OPEN RCT REPAIR WITH SUPPLEMENTAL [place] PATCH;  Surgeon: Garald Balding, MD;   Location: [place];  Service: Orthopedics;  Laterality: Right;    There were no vitals filed for this visit.  Visit Diagnosis:  Decreased ROM of right shoulder  Decreased strength      Subjective Assessment - 07/01/15 1118    Subjective No pain   Currently in Pain? No/denies                         [facility] Adult PT Treatment/Exercise - 07/01/15 1120    Shoulder Exercises: Seated   Flexion 10 reps   Flexion Weight (lbs) 1 Lb   Abduction 10 reps  2 sets1 o lbs, 1 set 1 LB   Other Seated Exercises AA reach hold 10 seconds eccentric scaption/ Flexion10 reps   Shoulder Exercises: Pulleys   Flexion --  20 reps   Other Pulley Exercises 3 LBS IR/ER 10 reps,, extension 10 reps 3 LBS, , extension and diagonal pulls from overhead, triceps  3 Lbs 10 reps, tactile intermittantly as needed   Cryotherapy   Number Minutes Cryotherapy 10 Minutes   Cryotherapy Location Shoulder   Type of Cryotherapy --  cold pack     Overhead exercise while sitting as above.             PT Short Term Goals - 06/17/15 1540    PT SHORT TERM GOAL #1   Title "Independent with initial HEP   Status Achieved  PT SHORT TERM GOAL #2   Title "Report pain decrease from  5  /10 to   3 /10.   Status Achieved   PT SHORT TERM GOAL #3   Title "Demonstrate and verbalize understanding of condition management including RICE, positioning, HEP.    Status Achieved   PT SHORT TERM GOAL #4   Title Pt caregiver will be able to correctly execute PROM for Right shoulder for pt    Status Deferred           PT Long Term Goals - 06/17/15 1540    PT LONG TERM GOAL #1   Title "Pt will be independent with advanced HEP.    Time 6   Period Weeks   Status On-going   PT LONG TERM GOAL #2   Title "Pain will decrease to 1/10 with all functional activities   Time 6   Period Weeks   Status Partially Met   PT LONG TERM GOAL #3   Title "R shoulder AROM scaption will improve to 0-120  degrees for improved  overhead reaching to reach dishes and retrieve items from freezer.   Status Achieved   PT LONG TERM GOAL #4   Title "R shoulder AROM will return to [facility] to return to pain-free ADLs such as dressing and grooming   Time 6   Period Weeks   Status Partially Met   PT LONG TERM GOAL #5   Title "FOTO will improve from  55%  to  37%   indicating improved functional mobility .    Status Achieved   PT LONG TERM GOAL #6   Title Pt will be able to drive without exacerbating pain and turn steering wheel for turns   Status Achieved   PT LONG TERM GOAL #7   Title Pt will increase AROM of Right shoulder in order to style hair independently   Time 6   Period Weeks   Status On-going               Plan - 07/01/15 1146    Clinical Impression Statement strengthening focus. able to exercise without pain.  strength 4-/5 within available range flexion.  No goals met.   PT Next Visit Plan strengthening   Consulted and Agree with Plan of Care Patient        Problem List Patient Active Problem List   Diagnosis Date Noted  . Complete tear of right rotator cuff 01/21/2015  . Severe obesity (BMI >= 40) 01/21/2015  . Nausea alone 04/04/2013  . H/O adenomatous polyp of colon 04/04/2013  . FATTY LIVER DISEASE 11/11/2010  . RUQ PAIN 11/11/2010  . NONSPEC ELEVATION OF LEVELS OF TRANSAMINASE/LDH 11/11/2010  . COLONIC POLYPS, HYPERPLASTIC, HX OF 06/25/2010  . HYPOTHYROIDISM 06/24/2010  . ANEMIA-NOS 06/24/2010    HARRIS,KAREN 07/01/2015, 11:49 AM  [facility] [address] [place], [place], [address] Phone: [phone]   Fax:  [phone]     Melvenia Needles, PTA 07/01/2015 11:49 AM Phone: [phone] Fax: [phone]

## 2015-07-02 DIAGNOSIS — S9031XD Contusion of right foot, subsequent encounter: Secondary | ICD-10-CM | POA: Diagnosis not present

## 2015-07-03 ENCOUNTER — Ambulatory Visit: Payer: Medicare Other | Admitting: Physical Therapy

## 2015-07-03 DIAGNOSIS — R531 Weakness: Secondary | ICD-10-CM

## 2015-07-03 DIAGNOSIS — M25511 Pain in right shoulder: Secondary | ICD-10-CM

## 2015-07-03 DIAGNOSIS — M6281 Muscle weakness (generalized): Secondary | ICD-10-CM | POA: Diagnosis not present

## 2015-07-03 DIAGNOSIS — M25611 Stiffness of right shoulder, not elsewhere classified: Secondary | ICD-10-CM

## 2015-07-03 NOTE — Therapy (Signed)
[place], [place], [address] Phone: [phone]   Fax:  [phone]  Physical Therapy Treatment  Patient Details  Name: Cheryl [name] MRN: [id number] Date of Birth: September 06, 1946 Referring Provider:  Sharilyn Sites, MD  Encounter Date: 07/03/2015      PT End of Session - 07/03/15 1208    Visit Number 36   Number of Visits 42   Date for PT Re-Evaluation 07/17/15   PT Start Time 1105   PT Stop Time 1200   PT Time Calculation (min) 55 min   Activity Tolerance Patient tolerated treatment well   Behavior During Therapy [facility] for tasks assessed/performed      Past Medical History  Diagnosis Date  . Acid reflux   . Sleep apnea     had surgery to correct  . Sinus drainage   . Arthritis     Past Surgical History  Procedure Laterality Date  . Knee surgery      x2  . Rotator cuff repair      x2  . Trigger finger release    . Carpal tunnel release    . Colonoscopy  02/15/2005    [id number] small polyps ablated via cold biopsy, one from transverse colon and two from the rectum/Small external hemorrhoids  . Colonoscopy  07/10/2010    [id number] rectum/long redundant colon, polyps in the sigmoid, descending, hepatic flexure/ADENOMATOUS POLYPS. next TCS due 06/2015  . Esophagogastroduodenoscopy  1995    Gastritis  . Colonoscopy  1995    3 polyps, path revealed chronic colitis  . Esophagogastroduodenoscopy N/A 04/16/2013    Procedure: ESOPHAGOGASTRODUODENOSCOPY (EGD);  Surgeon: Daneil Dolin, MD;  [facility];  Service: Endoscopy;  Laterality: N/A;  3:30  . Uvulopalatopharyngoplasty    . Shoulder arthroscopy with rotator cuff repair and subacromial decompression Right 01/21/2015    Procedure: RIGHT SHOULDER ARTHROSCOPIC DEBRIDEMNT OF G-H JOINT AND REMOVAL OF LOOSE BODIES,ARTHROSCOPIC SUBACROMIAL DECOMPRESSION,MINI OPEN RCT REPAIR WITH SUPPLEMENTAL [facility] PATCH;  Surgeon: Garald Balding, MD;   Location: [place];  Service: Orthopedics;  Laterality: Right;    There were no vitals filed for this visit.  Visit Diagnosis:  Decreased ROM of right shoulder  Decreased strength  Pain in joint, shoulder region, right      Subjective Assessment - 07/03/15 1107    Subjective No pain , She does her exercises .   Currently in Pain? No/denies                         [facility] Adult PT Treatment/Exercise - 07/03/15 1108    Shoulder Exercises: Seated   Theraband Level (Shoulder External Rotation) Level 2 (Red)  10 reps 2 sets   External Rotation Limitations can now reach base of shull.   Flexion 10 reps   Flexion Limitations 130 Sitting AROM RT   Abduction 10 reps  110 abduction  AROM Sitting   Shoulder Exercises: Sidelying   External Rotation 20 reps   External Rotation Weight (lbs) 2   External Rotation Limitations 15 reps   Flexion 10 reps  cues   ABduction 10 reps   Shoulder Exercises: Standing   Internal Rotation Weight (lbs) 3   Internal Rotation Limitations cane used 10 X   Flexion 10 reps  cues   Extension Weight (lbs) 3   Extension Limitations 10  ROM WNL  cane used   Other Standing Exercises shelf reach overhead 1 LB flex/ scaption  10 X each  cued guided scapula initially   Shoulder Exercises: Pulleys   Flexion --  20 reps.   Shoulder Exercises: Stretch   Corner Stretch Limitations doorway overhead reach 10 reps   Cryotherapy   Number Minutes Cryotherapy 10 Minutes   Cryotherapy Location Shoulder   Type of Cryotherapy --  cold pack                  PT Short Term Goals - 06/17/15 1540    PT SHORT TERM GOAL #1   Title "Independent with initial HEP   Status Achieved   PT SHORT TERM GOAL #2   Title "Report pain decrease from  5  /10 to   3 /10.   Status Achieved   PT SHORT TERM GOAL #3   Title "Demonstrate and verbalize understanding of condition management including RICE, positioning, HEP.    Status Achieved   PT SHORT TERM GOAL  #4   Title Pt caregiver will be able to correctly execute PROM for Right shoulder for pt    Status Deferred           PT Long Term Goals - 07/03/15 1211    PT LONG TERM GOAL #3   Title "R shoulder AROM scaption will improve to 0-120  degrees for improved overhead reaching to reach dishes and retrieve items from freezer.   Baseline 130   Time 16   Period Weeks   Status Achieved               Plan - 07/03/15 1208    Clinical Impression Statement Continued to be able to tolerate increased reps with exercise.    Can now reach base of head VS her ear.   PT Next Visit Plan strengthening   Consulted and Agree with Plan of Care Patient        Problem List Patient Active Problem List   Diagnosis Date Noted  . Complete tear of right rotator cuff 01/21/2015  . Severe obesity (BMI >= 40) 01/21/2015  . Nausea alone 04/04/2013  . H/O adenomatous polyp of colon 04/04/2013  . FATTY LIVER DISEASE 11/11/2010  . RUQ PAIN 11/11/2010  . NONSPEC ELEVATION OF LEVELS OF TRANSAMINASE/LDH 11/11/2010  . COLONIC POLYPS, HYPERPLASTIC, HX OF 06/25/2010  . HYPOTHYROIDISM 06/24/2010  . ANEMIA-NOS 06/24/2010    HARRIS,KAREN 07/03/2015, 12:17 PM  [facility] [address] [place], [place], [address] Phone: [phone]   Fax:  [phone]

## 2015-07-08 ENCOUNTER — Ambulatory Visit: Payer: Medicare Other | Admitting: Physical Therapy

## 2015-07-08 DIAGNOSIS — M25511 Pain in right shoulder: Secondary | ICD-10-CM | POA: Diagnosis not present

## 2015-07-08 DIAGNOSIS — M25611 Stiffness of right shoulder, not elsewhere classified: Secondary | ICD-10-CM

## 2015-07-08 DIAGNOSIS — M6281 Muscle weakness (generalized): Secondary | ICD-10-CM | POA: Diagnosis not present

## 2015-07-08 DIAGNOSIS — R531 Weakness: Secondary | ICD-10-CM

## 2015-07-08 NOTE — Therapy (Signed)
[place], [place], [address] Phone: [phone]   Fax:  [phone]  Physical Therapy Treatment  Patient Details  Name: Cheryl [name] MRN: [id number] Date of Birth: 09-27-1946 Referring Provider:  Sharilyn Sites, MD  Encounter Date: 07/08/2015      PT End of Session - 07/08/15 1311    Visit Number 37   Number of Visits 42   Date for PT Re-Evaluation 07/17/15   PT Start Time 1104   PT Stop Time 6629   PT Time Calculation (min) 52 min   Activity Tolerance Patient tolerated treatment well   Behavior During Therapy [facility] for tasks assessed/performed      Past Medical History  Diagnosis Date  . Acid reflux   . Sleep apnea     had surgery to correct  . Sinus drainage   . Arthritis     Past Surgical History  Procedure Laterality Date  . Knee surgery      x2  . Rotator cuff repair      x2  . Trigger finger release    . Carpal tunnel release    . Colonoscopy  02/15/2005    UTM:[id number] small polyps ablated via cold biopsy, one from transverse colon and two from the rectum/Small external hemorrhoids  . Colonoscopy  07/10/2010    PTW:SFKCLE rectum/long redundant colon, polyps in the sigmoid, descending, hepatic flexure/ADENOMATOUS POLYPS. next TCS due 06/2015  . Esophagogastroduodenoscopy  1995    Gastritis  . Colonoscopy  1995    3 polyps, path revealed chronic colitis  . Esophagogastroduodenoscopy N/A 04/16/2013    RMR: HH  . Uvulopalatopharyngoplasty    . Shoulder arthroscopy with rotator cuff repair and subacromial decompression Right 01/21/2015    Procedure: RIGHT SHOULDER ARTHROSCOPIC DEBRIDEMNT OF G-H JOINT AND REMOVAL OF LOOSE BODIES,ARTHROSCOPIC SUBACROMIAL DECOMPRESSION,MINI OPEN RCT REPAIR WITH SUPPLEMENTAL [facility] PATCH;  Surgeon: Garald Balding, MD;  Location: [place];  Service: Orthopedics;  Laterality: Right;    There were no vitals filed for this visit.  Visit Diagnosis:  Decreased ROM  of right shoulder  Decreased strength      Subjective Assessment - 07/08/15 1105    Subjective No shoulder pain .  Back pain was there this morning,  I'm going to have to get the shot in the back.  Shoulder is doing fine.   Pain Score 0-No pain   Multiple Pain Sites Yes  10/10 back pain                         [facility] Adult PT Treatment/Exercise - 07/08/15 1107    Shoulder Exercises: Supine   ABduction Strengthening   Shoulder ABduction Weight (lbs) 1   Other Supine Exercises triceps 4 LBS 10 reps   Shoulder Exercises: Seated   Extension 10 reps   Extension Weight (lbs) 6  LBS   Row 10 reps  , 2 sets,   Other Seated Exercises X to V arms 10 reps   Shoulder Exercises: Prone   Retraction 10 reps  6 lbs   Extension 10 reps  6 LBS    Shoulder Exercises: Sidelying   External Rotation 10 reps   External Rotation Weight (lbs) 3 Lbs   ABduction 10 reps   ABduction Weight (lbs) 1   Shoulder Exercises: Standing   Other Standing Exercises lowering 1/2 full water jug from 2 to 1 shelf. 10 reps   Other Standing Exercises body blade  abd, flexion, punch toward floor, dynamic, static intervals   Shoulder Exercises: Pulleys   Flexion --  30   Shoulder Exercises: ROM/Strengthening   UBE (Upper Arm Bike) 6 minutes, L4.5   Shoulder Exercises: Stretch   Star Gazer Stretch --  10   Cryotherapy   Number Minutes Cryotherapy 10 Minutes   Cryotherapy Location Shoulder   Type of Cryotherapy --  cold pack   Manual Therapy   Passive ROM ER, resisted RE 10 reps ,, IR shoulder 80 able to reach mat.                  PT Short Term Goals - 06/17/15 1540    PT SHORT TERM GOAL #1   Title "Independent with initial HEP   Status Achieved   PT SHORT TERM GOAL #2   Title "Report pain decrease from  5  /10 to   3 /10.   Status Achieved   PT SHORT TERM GOAL #3   Title "Demonstrate and verbalize understanding of condition management including RICE, positioning, HEP.     Status Achieved   PT SHORT TERM GOAL #4   Title Pt caregiver will be able to correctly execute PROM for Right shoulder for pt    Status Deferred           PT Long Term Goals - 07/08/15 1314    PT LONG TERM GOAL #1   Title "Pt will be independent with advanced HEP.    Baseline independent with exercises given so far.   Time 6   Period Weeks   Status On-going   PT LONG TERM GOAL #2   Title "Pain will decrease to 1/10 with all functional activities   Baseline varies   Time 6   Period Weeks   Status Partially Met   PT LONG TERM GOAL #3   Title "R shoulder AROM scaption will improve to 0-120  degrees for improved overhead reaching to reach dishes and retrieve items from freezer.   Baseline 130   Time 16   Period Weeks   Status Achieved   PT LONG TERM GOAL #4   Title "R shoulder AROM will return to [facility] to return to pain-free ADLs such as dressing and grooming   Baseline hair getting easier, still not back to baseline behind head.   Time 6   Period Weeks   Status Partially Met   PT LONG TERM GOAL #5   Title "FOTO will improve from  55%  to  37%   indicating improved functional mobility .    Period Weeks   Status Unable to assess   PT LONG TERM GOAL #6   Title Pt will be able to drive without exacerbating pain and turn steering wheel for turns   Status Achieved   PT LONG TERM GOAL #7   Title Pt will increase AROM of Right shoulder in order to style hair independently   Baseline Cannot style hair independently, hard to place a barrett in back of head.    Time 6   Period Weeks   Status On-going               Plan - 07/08/15 1312    Clinical Impression Statement Tightness intermittantly during session, no pain today.  Patient continues to notice small gains daily.  Able to increase weight to 6 LBS today.   PT Next Visit Plan strengthening. measure ROM   Consulted and Agree with Plan of Care Patient          Problem List Patient Active Problem List   Diagnosis  Date Noted  . Complete tear of right rotator cuff 01/21/2015  . Severe obesity (BMI >= 40) 01/21/2015  . Nausea alone 04/04/2013  . H/O adenomatous polyp of colon 04/04/2013  . FATTY LIVER DISEASE 11/11/2010  . RUQ PAIN 11/11/2010  . NONSPEC ELEVATION OF LEVELS OF TRANSAMINASE/LDH 11/11/2010  . COLONIC POLYPS, HYPERPLASTIC, HX OF 06/25/2010  . HYPOTHYROIDISM 06/24/2010  . ANEMIA-NOS 06/24/2010    HARRIS,KAREN 07/08/2015, 1:18 PM  [facility] [address] [place], , [address] Phone: [phone]   Fax:  [phone]     Karen Harris, PTA 07/08/2015 1:18 PM Phone: [phone] Fax: [phone]  

## 2015-07-09 ENCOUNTER — Ambulatory Visit (INDEPENDENT_AMBULATORY_CARE_PROVIDER_SITE_OTHER): Payer: Medicare Other | Admitting: Gastroenterology

## 2015-07-09 ENCOUNTER — Encounter: Payer: Self-pay | Admitting: Gastroenterology

## 2015-07-09 ENCOUNTER — Other Ambulatory Visit: Payer: Self-pay

## 2015-07-09 VITALS — BP 147/81 | HR 80 | Temp 97.3°F | Ht 59.0 in | Wt 242.4 lb

## 2015-07-09 DIAGNOSIS — K760 Fatty (change of) liver, not elsewhere classified: Secondary | ICD-10-CM

## 2015-07-09 DIAGNOSIS — Z86010 Personal history of colonic polyps: Secondary | ICD-10-CM

## 2015-07-09 MED ORDER — PEG 3350-KCL-NA BICARB-NACL 420 G PO SOLR
4000.0000 mL | ORAL | Status: DC
Start: 2015-07-09 — End: 2016-02-20

## 2015-07-09 NOTE — Progress Notes (Signed)
Primary Care Physician:  Purvis Kilts, MD  Primary Gastroenterologist:  Garfield Cornea, MD   Chief Complaint  Patient presents with  . set up TCS./ EGD    HPI:  Cheryl [name] is a 69 y.o. female here to schedule surveillance colonoscopy. Her last one was done in July 2011, adenomatous polyp removed. Also with history of hepatic steatosis and Riedel's lobe configuration of liver. LFTs are normal in January 2016. Last imaging of the liver in April 2014, fatty liver but otherwise unremarkable.  BM regular with stool softeners. No melena, brbpr. No heartburn, dysphagia, abdominal pain. No vomiting. Taking aleve as needed only. Tries to limit. Has back, feet, shoulder issues.     Current Outpatient Prescriptions  Medication Sig Dispense Refill  . albuterol (PROVENTIL HFA;VENTOLIN HFA) 108 (90 BASE) MCG/ACT inhaler Inhale 1 puff into the lungs every 6 (six) hours as needed for wheezing or shortness of breath.    . Biotin 1000 MCG tablet Take 1,000 mcg by mouth every morning.     Marland Kitchen CALCIUM-VITAMIN D PO Take 1 tablet by mouth every morning.    . Cyanocobalamin (VITAMIN B-12 PO) Take 1 tablet by mouth every morning.     . fluticasone (FLONASE) 50 MCG/ACT nasal spray Place 1 spray into both nostrils daily as needed for allergies (sinuses).    . montelukast (SINGULAIR) 10 MG tablet Take 10 mg by mouth every morning.    . naproxen sodium (ALEVE) 220 MG tablet Take 440 mg by mouth 2 (two) times daily as needed (pain).     . ondansetron (ZOFRAN ODT) 4 MG disintegrating tablet Take 1 tablet (4 mg total) by mouth every 8 (eight) hours as needed for nausea. 10 tablet 0  . pantoprazole (PROTONIX) 40 MG tablet Take 1 tablet (40 mg total) by mouth daily. 30 tablet 2  . raloxifene (EVISTA) 60 MG tablet Take 60 mg by mouth daily.     No current facility-administered medications for this visit.    Allergies as of 07/09/2015 - Review Complete 07/09/2015  Allergen Reaction Noted  . Prednisone Other  (See Comments) 04/04/2013  . Tramadol Nausea Only 01/21/2015  . Aspirin Nausea And Vomiting   . Celecoxib Rash     Past Medical History  Diagnosis Date  . Acid reflux   . Sleep apnea     had surgery to correct  . Sinus drainage   . Arthritis     Past Surgical History  Procedure Laterality Date  . Knee surgery      x2  . Rotator cuff repair      x2  . Trigger finger release    . Carpal tunnel release    . Colonoscopy  02/15/2005    [id number] small polyps ablated via cold biopsy, one from transverse colon and two from the rectum/Small external hemorrhoids  . Colonoscopy  07/10/2010    [id number] rectum/long redundant colon, polyps in the sigmoid, descending, hepatic flexure/ADENOMATOUS POLYPS. next TCS due 06/2015  . Esophagogastroduodenoscopy  1995    Gastritis  . Colonoscopy  1995    3 polyps, path revealed chronic colitis  . Esophagogastroduodenoscopy N/A 04/16/2013    RMR: HH  . Uvulopalatopharyngoplasty    . Shoulder arthroscopy with rotator cuff repair and subacromial decompression Right 01/21/2015    Procedure: RIGHT SHOULDER ARTHROSCOPIC DEBRIDEMNT OF G-H JOINT AND REMOVAL OF LOOSE BODIES,ARTHROSCOPIC SUBACROMIAL DECOMPRESSION,MINI OPEN RCT REPAIR WITH SUPPLEMENTAL [facility] PATCH;  Surgeon: Garald Balding, MD;  Location: [place];  Service: Orthopedics;  Laterality: Right;    Family History  Problem Relation Age of Onset  . Cancer Mother   . Cancer Father   . Diabetes Sister   . Colon cancer Neg Hx   . Liver disease Neg Hx     History   Social History  . Marital Status: Married    Spouse Name: N/A  . Number of Children: 2  . Years of Education: N/A   Occupational History  . BUS DRIVER    Social History Main Topics  . Smoking status: Former Smoker -- 48 years    Types: Cigarettes  . Smokeless tobacco: Former Systems developer    Quit date: 12/20/2004  . Alcohol Use: No  . Drug Use: No  . Sexual Activity: Not on file   Other Topics Concern  . Not on file    Social History Narrative      ROS:  General: Negative for anorexia, weight loss, fever, chills, fatigue, weakness. Eyes: Negative for vision changes.  ENT: Negative for hoarseness, difficulty swallowing , nasal congestion. CV: Negative for chest pain, angina, palpitations, dyspnea on exertion, peripheral edema.  Respiratory: Negative for dyspnea at rest, dyspnea on exertion, cough, sputum, wheezing.  GI: See history of present illness. GU:  Negative for dysuria, hematuria, urinary incontinence, urinary frequency, nocturnal urination.  MS: Negative for joint pain, low back pain.  Derm: Negative for rash or itching.  Neuro: Negative for weakness, abnormal sensation, seizure, frequent headaches, memory loss, confusion.  Psych: Negative for anxiety, depression, suicidal ideation, hallucinations.  Endo: Negative for unusual weight change.  Heme: Negative for bruising or bleeding. Allergy: Negative for rash or hives.    Physical Examination:  BP 147/81 mmHg  Pulse 80  Temp(Src) 97.3 F (36.3 C)  Ht 4\' 11"  (1.499 m)  Wt 242 lb 6.4 oz (109.952 kg)  BMI 48.93 kg/m2   General: Well-nourished, well-developed in no acute distress.  Head: Normocephalic, atraumatic.   Eyes: Conjunctiva pink, no icterus. Mouth: Oropharyngeal mucosa moist and pink , no lesions erythema or exudate. Neck: Supple without thyromegaly, masses, or lymphadenopathy.  Lungs: Clear to auscultation bilaterally.  Heart: Regular rate and rhythm, no murmurs rubs or gallops.  Abdomen: Bowel sounds are normal, nontender, nondistended, no hepatosplenomegaly or masses, no abdominal bruits or    hernia , no rebound or guarding.   Rectal: not performed Extremities: No lower extremity edema. No clubbing or deformities.  Neuro: Alert and oriented x 4 , grossly normal neurologically.  Skin: Warm and dry, no rash or jaundice.   Psych: Alert and cooperative, normal mood and affect.  Labs: Lab Results  Component Value Date    WBC 8.5 01/14/2015   HGB 12.6 01/14/2015   HCT 40.2 01/14/2015   MCV 95.9 01/14/2015   PLT 370 01/14/2015   Lab Results  Component Value Date   CREATININE 1.18* 01/14/2015   BUN 13 01/14/2015   NA 141 01/14/2015   K 3.9 01/14/2015   CL 108 01/14/2015   CO2 24 01/14/2015   Lab Results  Component Value Date   ALT 21 01/14/2015   AST 32 01/14/2015   ALKPHOS 73 01/14/2015   BILITOT 0.5 01/14/2015     Imaging Studies: No results found.

## 2015-07-09 NOTE — Assessment & Plan Note (Signed)
Due surveillance colonoscopy at this time.  I have discussed the risks, alternatives, benefits with regards to but not limited to the risk of reaction to medication, bleeding, infection, perforation and the patient is agreeable to proceed. Written consent to be obtained.

## 2015-07-09 NOTE — Assessment & Plan Note (Signed)
LFTs normal. Recommend checking at least yearly with PCP.  Instructions for fatty liver: Recommend 1-2# weight loss per week until ideal body weight through exercise & diet. Low fat/cholesterol diet.   Avoid sweets, sodas, fruit juices, sweetened beverages like tea, etc. Gradually increase exercise from 15 min daily up to 1 hr per day 5 days/week. Limit alcohol use.

## 2015-07-09 NOTE — Patient Instructions (Signed)
Colonoscopy as scheduled. See separate instructions.   Instructions for fatty liver: Recommend 1-2# weight loss per week until ideal body weight through exercise & diet. Low fat/cholesterol diet.   Avoid sweets, sodas, fruit juices, sweetened beverages like tea, etc. Gradually increase exercise from 15 min daily up to 1 hr per day 5 days/week. Limit alcohol use.

## 2015-07-09 NOTE — Progress Notes (Signed)
CC'ED TO PCP 

## 2015-07-10 ENCOUNTER — Ambulatory Visit: Payer: Medicare Other | Admitting: Physical Therapy

## 2015-07-10 DIAGNOSIS — M6281 Muscle weakness (generalized): Secondary | ICD-10-CM | POA: Diagnosis not present

## 2015-07-10 DIAGNOSIS — M25611 Stiffness of right shoulder, not elsewhere classified: Secondary | ICD-10-CM

## 2015-07-10 DIAGNOSIS — M545 Low back pain: Secondary | ICD-10-CM | POA: Diagnosis not present

## 2015-07-10 DIAGNOSIS — G894 Chronic pain syndrome: Secondary | ICD-10-CM | POA: Diagnosis not present

## 2015-07-10 DIAGNOSIS — R531 Weakness: Secondary | ICD-10-CM

## 2015-07-10 DIAGNOSIS — M47817 Spondylosis without myelopathy or radiculopathy, lumbosacral region: Secondary | ICD-10-CM | POA: Diagnosis not present

## 2015-07-10 DIAGNOSIS — M25511 Pain in right shoulder: Secondary | ICD-10-CM | POA: Diagnosis not present

## 2015-07-10 NOTE — Therapy (Signed)
[place], [place], [address] Phone: [phone]   Fax:  [phone]  Physical Therapy Treatment  Patient Details  Name: Cheryl [name] MRN: [id number] Date of Birth: [birth year] Referring Provider:  Sharilyn Sites, MD  Encounter Date: 07/10/2015      PT End of Session - 07/10/15 1236    Visit Number 38   Number of Visits 42   Date for PT Re-Evaluation 07/17/15   PT Start Time 2563   PT Stop Time 1239   PT Time Calculation (min) 54 min   Activity Tolerance Patient tolerated treatment well   Behavior During Therapy [facility] for tasks assessed/performed      Past Medical History  Diagnosis Date  . Acid reflux   . Sleep apnea     had surgery to correct  . Sinus drainage   . Arthritis     Past Surgical History  Procedure Laterality Date  . Knee surgery      x2  . Rotator cuff repair      x2  . Trigger finger release    . Carpal tunnel release    . Colonoscopy  02/15/2005    [id number] small polyps ablated via cold biopsy, one from transverse colon and two from the rectum/Small external hemorrhoids  . Colonoscopy  07/10/2010    [id number] rectum/long redundant colon, polyps in the sigmoid, descending, hepatic flexure/ADENOMATOUS POLYPS. next TCS due 06/2015  . Esophagogastroduodenoscopy  1995    Gastritis  . Colonoscopy  1995    3 polyps, path revealed chronic colitis  . Esophagogastroduodenoscopy N/A 04/16/2013    RMR: HH  . Uvulopalatopharyngoplasty    . Shoulder arthroscopy with rotator cuff repair and subacromial decompression Right 01/21/2015    Procedure: RIGHT SHOULDER ARTHROSCOPIC DEBRIDEMNT OF G-H JOINT AND REMOVAL OF LOOSE BODIES,ARTHROSCOPIC SUBACROMIAL DECOMPRESSION,MINI OPEN RCT REPAIR WITH SUPPLEMENTAL [facility] PATCH;  Surgeon: Garald Balding, MD;  Location: [place];  Service: Orthopedics;  Laterality: Right;    There were no vitals filed for this visit.  Visit Diagnosis:  Decreased  strength  Decreased ROM of right shoulder                       [facility] Adult PT Treatment/Exercise - 07/10/15 1157    Elbow Exercises   Elbow Flexion 10 reps  10 LBS, using 2 hands   Shoulder Exercises: Standing   Other Standing Exercises lowering water from overhead container of water 10 reps   Other Standing Exercises wall push up 15   Shoulder Exercises: Pulleys   Flexion --  30   Shoulder Exercises: ROM/Strengthening   UBE (Upper Arm Bike) 6 minutes   Other ROM/Strengthening Exercises cable cross IR/ ER  ER 3 LBS, IR 7 LBS,, biceps 3 LBS. X15 1/10 pain, punch 15 3 LBS   Other ROM/Strengthening Exercises wall push ups.   Cryotherapy   Number Minutes Cryotherapy 10 Minutes   Cryotherapy Location Shoulder   Type of Cryotherapy --  cold pack                  PT Short Term Goals - 06/17/15 1540    PT SHORT TERM GOAL #1   Title "Independent with initial HEP   Status Achieved   PT SHORT TERM GOAL #2   Title "Report pain decrease from  5  /10 to   3 /10.   Status Achieved   PT SHORT TERM GOAL #3   Title "  Demonstrate and verbalize understanding of condition management including RICE, positioning, HEP.    Status Achieved   PT SHORT TERM GOAL #4   Title Pt caregiver will be able to correctly execute PROM for Right shoulder for pt    Status Deferred           PT Long Term Goals - 07/08/15 1314    PT LONG TERM GOAL #1   Title "Pt will be independent with advanced HEP.    Baseline independent with exercises given so far.   Time 6   Period Weeks   Status On-going   PT LONG TERM GOAL #2   Title "Pain will decrease to 1/10 with all functional activities   Baseline varies   Time 6   Period Weeks   Status Partially Met   PT LONG TERM GOAL #3   Title "R shoulder AROM scaption will improve to 0-120  degrees for improved overhead reaching to reach dishes and retrieve items from freezer.   Baseline 130   Time 16   Period Weeks   Status Achieved    PT LONG TERM GOAL #4   Title "R shoulder AROM will return to [facility] to return to pain-free ADLs such as dressing and grooming   Baseline hair getting easier, still not back to baseline behind head.   Time 6   Period Weeks   Status Partially Met   PT LONG TERM GOAL #5   Title "FOTO will improve from  55%  to  37%   indicating improved functional mobility .    Period Weeks   Status Unable to assess   PT LONG TERM GOAL #6   Title Pt will be able to drive without exacerbating pain and turn steering wheel for turns   Status Achieved   PT LONG TERM GOAL #7   Title Pt will increase AROM of Right shoulder in order to style hair independently   Baseline Cannot style hair independently, hard to place a barrett in back of head.    Time 6   Period Weeks   Status On-going               Plan - 07/10/15 1243    PT Next Visit Plan strengthening. measure ROM   Consulted and Agree with Plan of Care Patient        Problem List Patient Active Problem List   Diagnosis Date Noted  . Complete tear of right rotator cuff 01/21/2015  . Severe obesity (BMI >= 40) 01/21/2015  . Nausea alone 04/04/2013  . H/O adenomatous polyp of colon 04/04/2013  . Fatty liver 11/11/2010  . RUQ PAIN 11/11/2010  . NONSPEC ELEVATION OF LEVELS OF TRANSAMINASE/LDH 11/11/2010  . COLONIC POLYPS, HYPERPLASTIC, HX OF 06/25/2010  . HYPOTHYROIDISM 06/24/2010  . ANEMIA-NOS 06/24/2010    Kalieb Freeland 07/10/2015, 12:46 PM  [facility] [address] [place], [place], [address] Phone: [phone]   Fax:  [phone]     Melvenia Needles, PTA 07/10/2015 12:46 PM Phone: [phone] Fax: [phone]

## 2015-07-15 ENCOUNTER — Ambulatory Visit: Payer: Medicare Other | Admitting: Physical Therapy

## 2015-07-15 DIAGNOSIS — M6281 Muscle weakness (generalized): Secondary | ICD-10-CM | POA: Diagnosis not present

## 2015-07-15 DIAGNOSIS — M25511 Pain in right shoulder: Secondary | ICD-10-CM | POA: Diagnosis not present

## 2015-07-15 DIAGNOSIS — R531 Weakness: Secondary | ICD-10-CM

## 2015-07-15 DIAGNOSIS — M25611 Stiffness of right shoulder, not elsewhere classified: Secondary | ICD-10-CM

## 2015-07-15 NOTE — Therapy (Signed)
[place], [place], [address] Phone: [phone]   Fax:  [phone]  Physical Therapy Treatment  Patient Details  Name: Cheryl [name] MRN: [id number] Date of Birth: [birth year] Referring Provider:  Sharilyn Sites, MD  Encounter Date: 07/15/2015      PT End of Session - 07/15/15 1157    Visit Number 39   Number of Visits 42   Date for PT Re-Evaluation 07/17/15   PT Start Time 1102   PT Stop Time 1202   PT Time Calculation (min) 60 min   Activity Tolerance Patient tolerated treatment well   Behavior During Therapy [facility] for tasks assessed/performed      Past Medical History  Diagnosis Date  . Acid reflux   . Sleep apnea     had surgery to correct  . Sinus drainage   . Arthritis     Past Surgical History  Procedure Laterality Date  . Knee surgery      x2  . Rotator cuff repair      x2  . Trigger finger release    . Carpal tunnel release    . Colonoscopy  02/15/2005    [id number] small polyps ablated via cold biopsy, one from transverse colon and two from the rectum/Small external hemorrhoids  . Colonoscopy  07/10/2010    [id number] rectum/long redundant colon, polyps in the sigmoid, descending, hepatic flexure/ADENOMATOUS POLYPS. next TCS due 06/2015  . Esophagogastroduodenoscopy  1995    Gastritis  . Colonoscopy  1995    3 polyps, path revealed chronic colitis  . Esophagogastroduodenoscopy N/A 04/16/2013    RMR: HH  . Uvulopalatopharyngoplasty    . Shoulder arthroscopy with rotator cuff repair and subacromial decompression Right 01/21/2015    Procedure: RIGHT SHOULDER ARTHROSCOPIC DEBRIDEMNT OF G-H JOINT AND REMOVAL OF LOOSE BODIES,ARTHROSCOPIC SUBACROMIAL DECOMPRESSION,MINI OPEN RCT REPAIR WITH SUPPLEMENTAL [facility] PATCH;  Surgeon: Garald Balding, MD;  Location: [place];  Service: Orthopedics;  Laterality: Right;    There were no vitals filed for this visit.  Visit Diagnosis:  Decreased  strength  Decreased ROM of right shoulder      Subjective Assessment - 07/15/15 1113    Subjective No pain.  Worried she will loose ground if she stops PT.  has been working on ER.    Currently in Pain? No/denies                         [facility] Adult PT Treatment/Exercise - 07/15/15 1121    Shoulder Exercises: Seated   Theraband Level (Shoulder External Rotation) Level 3 (Green)  10 reps   Shoulder Exercises: Standing   Internal Rotation Limitations cane with 5 LBS 10 reps   Flexion 10 reps   Shoulder Flexion Weight (lbs) 3   Flexion Limitations scaption to 90 degrees, some compensation   ABduction Limitations Horizontl abduction green band 10 reps, diagonal, green band 10 reps band extension 10 reps.   Extension Weight (lbs) 5   Extension Limitations 10 reps   Other Standing Exercises ball on wall circles both ways, push, horizontal and vertical moves 30 seconds each.   Other Standing Exercises rockwood green band 4 way, 10 reps each   Shoulder Exercises: Pulleys   Flexion --  30+ reps   Shoulder Exercises: ROM/Strengthening   UBE (Upper Arm Bike) 6 minutes 3 each direction L 4.5   Shoulder Exercises: Stretch   External Rotation Stretch --  2 reps, 60 seconds,  30 seconds each   Other Shoulder Stretches Hands behind head, moving elbows apart stretch 10 second hold 1 rep   Cryotherapy   Number Minutes Cryotherapy 10 Minutes   Cryotherapy Location Shoulder   Type of Cryotherapy --  cold pack RT shoulder                PT Education - 07/15/15 1156    Education provided Yes   Education Details condensed home exercise with strengthening.     Person(s) Educated Patient   Methods Explanation;Demonstration;Tactile cues;Handout   Comprehension Verbalized understanding;Returned demonstration          PT Short Term Goals - 06/17/15 1540    PT SHORT TERM GOAL #1   Title "Independent with initial HEP   Status Achieved   PT SHORT TERM GOAL #2   Title  "Report pain decrease from  5  /10 to   3 /10.   Status Achieved   PT SHORT TERM GOAL #3   Title "Demonstrate and verbalize understanding of condition management including RICE, positioning, HEP.    Status Achieved   PT SHORT TERM GOAL #4   Title Pt caregiver will be able to correctly execute PROM for Right shoulder for pt    Status Deferred           PT Long Term Goals - 07/08/15 1314    PT LONG TERM GOAL #1   Title "Pt will be independent with advanced HEP.    Baseline independent with exercises given so far.   Time 6   Period Weeks   Status On-going   PT LONG TERM GOAL #2   Title "Pain will decrease to 1/10 with all functional activities   Baseline varies   Time 6   Period Weeks   Status Partially Met   PT LONG TERM GOAL #3   Title "R shoulder AROM scaption will improve to 0-120  degrees for improved overhead reaching to reach dishes and retrieve items from freezer.   Baseline 130   Time 16   Period Weeks   Status Achieved   PT LONG TERM GOAL #4   Title "R shoulder AROM will return to [facility] to return to pain-free ADLs such as dressing and grooming   Baseline hair getting easier, still not back to baseline behind head.   Time 6   Period Weeks   Status Partially Met   PT LONG TERM GOAL #5   Title "FOTO will improve from  55%  to  37%   indicating improved functional mobility .    Period Weeks   Status Unable to assess   PT LONG TERM GOAL #6   Title Pt will be able to drive without exacerbating pain and turn steering wheel for turns   Status Achieved   PT LONG TERM GOAL #7   Title Pt will increase AROM of Right shoulder in order to style hair independently   Baseline Cannot style hair independently, hard to place a barrett in back of head.    Time 6   Period Weeks   Status On-going               Plan - 07/15/15 1202    Clinical Impression Statement able to condense final home exercise program .  She had no pain post exercise.          Problem  List Patient Active Problem List   Diagnosis Date Noted  . Complete tear of right rotator cuff 01/21/2015  .  Severe obesity (BMI >= 40) 01/21/2015  . Nausea alone 04/04/2013  . H/O adenomatous polyp of colon 04/04/2013  . Fatty liver 11/11/2010  . RUQ PAIN 11/11/2010  . NONSPEC ELEVATION OF LEVELS OF TRANSAMINASE/LDH 11/11/2010  . COLONIC POLYPS, HYPERPLASTIC, HX OF 06/25/2010  . HYPOTHYROIDISM 06/24/2010  . ANEMIA-NOS 06/24/2010    HARRIS,KAREN 07/15/2015, 12:15 PM  [facility] [address] [place], [place], [address] Phone: [phone]   Fax:  [phone]     Melvenia Needles, PTA 07/15/2015 12:15 PM Phone: [phone] Fax: [phone]

## 2015-07-17 ENCOUNTER — Ambulatory Visit: Payer: Medicare Other | Admitting: Physical Therapy

## 2015-07-17 DIAGNOSIS — M6281 Muscle weakness (generalized): Secondary | ICD-10-CM | POA: Diagnosis not present

## 2015-07-17 DIAGNOSIS — M25611 Stiffness of right shoulder, not elsewhere classified: Secondary | ICD-10-CM

## 2015-07-17 DIAGNOSIS — R531 Weakness: Secondary | ICD-10-CM

## 2015-07-17 DIAGNOSIS — M25511 Pain in right shoulder: Secondary | ICD-10-CM | POA: Diagnosis not present

## 2015-07-17 NOTE — Therapy (Signed)
[place], [place], [address] Phone: [phone]   Fax:  [phone]  Physical Therapy Treatment/Discharge Summary  Patient Details  Name: Cheryl [name] MRN: [id number] Date of Birth: Aug 10, 1946 Referring Provider:  Sharilyn Sites, MD  Encounter Date: 07/17/2015      PT End of Session - 07/17/15 1258    Visit Number 40   Number of Visits 42   Date for PT Re-Evaluation 07/17/15   PT Start Time 1105   PT Stop Time 1155   PT Time Calculation (min) 50 min   Activity Tolerance Patient tolerated treatment well;No increased pain   Behavior During Therapy [facility] for tasks assessed/performed      Past Medical History  Diagnosis Date  . Acid reflux   . Sleep apnea     had surgery to correct  . Sinus drainage   . Arthritis     Past Surgical History  Procedure Laterality Date  . Knee surgery      x2  . Rotator cuff repair      x2  . Trigger finger release    . Carpal tunnel release    . Colonoscopy  02/15/2005    WTU:UEKCM small polyps ablated via cold biopsy, one from transverse colon and two from the rectum/Small external hemorrhoids  . Colonoscopy  07/10/2010    KLK:JZPHXT rectum/long redundant colon, polyps in the sigmoid, descending, hepatic flexure/ADENOMATOUS POLYPS. next TCS due 06/2015  . Esophagogastroduodenoscopy  1995    Gastritis  . Colonoscopy  1995    3 polyps, path revealed chronic colitis  . Esophagogastroduodenoscopy N/A 04/16/2013    RMR: HH  . Uvulopalatopharyngoplasty    . Shoulder arthroscopy with rotator cuff repair and subacromial decompression Right 01/21/2015    Procedure: RIGHT SHOULDER ARTHROSCOPIC DEBRIDEMNT OF G-H JOINT AND REMOVAL OF LOOSE BODIES,ARTHROSCOPIC SUBACROMIAL DECOMPRESSION,MINI OPEN RCT REPAIR WITH SUPPLEMENTAL [facility] PATCH;  Surgeon: Garald Balding, MD;  Location: [place];  Service: Orthopedics;  Laterality: Right;    There were no vitals filed for this  visit.  Visit Diagnosis:  Decreased strength  Decreased ROM of right shoulder      Subjective Assessment - 07/17/15 1107    Subjective No pain.  Feels she will continue to exercise at [employer] 2 times a week during her normal exercise times .  No pain   Multiple Pain Sites No                         [facility] Adult PT Treatment/Exercise - 07/17/15 0001    Shoulder Exercises: Supine   Other Supine Exercises circles, horizontal ablduction, flexion extension  10 reps each   Shoulder Exercises: Sidelying   External Rotation 10 reps   Flexion 10 reps   ABduction 10 reps  2 sets.LBS and 3,4,LBS   Shoulder Exercises: Standing   Other Standing Exercises AROM RT 135 flexion, 105 abduction,, ER 60 humerus neutral., extension 75,    Other Standing Exercises cable cross, [place], biceps, triceps 7 LBS                  PT Short Term Goals - 06/17/15 1540    PT SHORT TERM GOAL #1   Title "Independent with initial HEP   Status Achieved   PT SHORT TERM GOAL #2   Title "Report pain decrease from  5  /10 to   3 /10.   Status Achieved   PT SHORT TERM GOAL #  3   Title "Demonstrate and verbalize understanding of condition management including RICE, positioning, HEP.    Status Achieved   PT SHORT TERM GOAL #4   Title Pt caregiver will be able to correctly execute PROM for Right shoulder for pt    Status Deferred           PT Long Term Goals - 01-Aug-2015 1302    PT LONG TERM GOAL #1   Title "Pt will be independent with advanced HEP.    Baseline independent   Time 6   Period Weeks   Status Achieved   PT LONG TERM GOAL #2   Title "Pain will decrease to 1/10 with all functional activities   Baseline 1/10   Time 6   Period Weeks   Status Achieved   PT LONG TERM GOAL #3   Baseline 120 today   Time 16   Period Weeks   Status Achieved   PT LONG TERM GOAL #4   Title "R shoulder AROM will return to [facility] to return to pain-free ADLs such as dressing and  grooming   Baseline WFL lacks just 1/2 inch different from LT in Flexion only. all other ROM=LT   Time 6   Period Weeks   Status Achieved   PT LONG TERM GOAL #5   Title "FOTO will improve from  55%  to  37%   indicating improved functional mobility .    Baseline 35 % limitation    Time 16   Period Weeks   Status Achieved   PT LONG TERM GOAL #6   Title Pt will be able to drive without exacerbating pain and turn steering wheel for turns   Time 16   Period Weeks   Status Achieved   PT LONG TERM GOAL #7   Title Pt will increase AROM of Right shoulder in order to style hair independently   Baseline can be independent with hair   Time 6   Period Weeks   Status Achieved               Plan - 08-01-15 1259    Clinical Impression Statement Ready for discharge .  END of POC.  Patient agrees she is ready for discharge and plans to continue xercising at home and at gym upon D/C.          G-Codes - 2015-08-01 1519    Functional Assessment Tool Used FOTO   Functional Limitation Carrying, moving and handling objects   Carrying, Moving and Handling Objects Goal Status [phone]) At least 20 percent but less than 40 percent impaired, limited or restricted   Carrying, Moving and Handling Objects Discharge Status [phone]) At least 20 percent but less than 40 percent impaired, limited or restricted      Problem List Patient Active Problem List   Diagnosis Date Noted  . Complete tear of right rotator cuff 01/21/2015  . Severe obesity (BMI >= 40) 01/21/2015  . Nausea alone 04/04/2013  . H/O adenomatous polyp of colon 04/04/2013  . Fatty liver 11/11/2010  . RUQ PAIN 11/11/2010  . NONSPEC ELEVATION OF LEVELS OF TRANSAMINASE/LDH 11/11/2010  . COLONIC POLYPS, HYPERPLASTIC, HX OF 06/25/2010  . HYPOTHYROIDISM 06/24/2010  . ANEMIA-NOS 06/24/2010    Alvera Singh 08/01/15, 3:29 PM  [facility] [address] [place], [place],  [address] Phone: [phone]   Fax:  [phone]  PHYSICAL THERAPY DISCHARGE SUMMARY  Visits from Start of Care: 40  Current functional level  related to goals / functional outcomes: See clinical summary above.  Patient has met max potential with PT but should make further gains in ROM and strength as she continues with her HEP.  Patient has met all goals and functional outcome score has improved significantly.  Remaining deficits  Decreased endrange of motion and full strength limitations Education / Equipment: Progressive HEP Plan: Patient agrees to discharge.  Patient goals were met. Patient is being discharged due to meeting the stated rehab goals.  ?????       Ruben Im, PT 07/17/2015 3:34 PM Phone: [phone] Fax: [phone] Melvenia Needles, PTA 07/17/2015 3:29 PM Phone: [phone] Fax: [phone]

## 2015-07-17 NOTE — Patient Instructions (Signed)
Continue exercises. Call if any questions.

## 2015-07-24 DIAGNOSIS — G894 Chronic pain syndrome: Secondary | ICD-10-CM | POA: Diagnosis not present

## 2015-07-24 DIAGNOSIS — M47817 Spondylosis without myelopathy or radiculopathy, lumbosacral region: Secondary | ICD-10-CM | POA: Diagnosis not present

## 2015-07-24 DIAGNOSIS — M545 Low back pain: Secondary | ICD-10-CM | POA: Diagnosis not present

## 2015-08-04 DIAGNOSIS — M10071 Idiopathic gout, right ankle and foot: Secondary | ICD-10-CM | POA: Diagnosis not present

## 2015-08-04 DIAGNOSIS — M109 Gout, unspecified: Secondary | ICD-10-CM | POA: Diagnosis not present

## 2015-08-05 ENCOUNTER — Ambulatory Visit (HOSPITAL_COMMUNITY)
Admission: RE | Admit: 2015-08-05 | Discharge: 2015-08-05 | Disposition: A | Discharge status: Home / Self Care | Payer: Medicare Other | Source: Ambulatory Visit | Attending: Internal Medicine | Admitting: Internal Medicine

## 2015-08-05 ENCOUNTER — Encounter (HOSPITAL_COMMUNITY)
Admission: RE | Disposition: A | Discharge status: Home / Self Care | Payer: Self-pay | Source: Ambulatory Visit | Attending: Internal Medicine

## 2015-08-05 ENCOUNTER — Encounter (HOSPITAL_COMMUNITY): Payer: Self-pay | Admitting: *Deleted

## 2015-08-05 DIAGNOSIS — Z860100 Personal history of colon polyps, unspecified: Secondary | ICD-10-CM | POA: Insufficient documentation

## 2015-08-05 DIAGNOSIS — Z1211 Encounter for screening for malignant neoplasm of colon: Secondary | ICD-10-CM | POA: Diagnosis not present

## 2015-08-05 DIAGNOSIS — K5730 Diverticulosis of large intestine without perforation or abscess without bleeding: Secondary | ICD-10-CM | POA: Diagnosis not present

## 2015-08-05 DIAGNOSIS — K219 Gastro-esophageal reflux disease without esophagitis: Secondary | ICD-10-CM | POA: Insufficient documentation

## 2015-08-05 DIAGNOSIS — Z87891 Personal history of nicotine dependence: Secondary | ICD-10-CM | POA: Insufficient documentation

## 2015-08-05 DIAGNOSIS — K6389 Other specified diseases of intestine: Secondary | ICD-10-CM | POA: Diagnosis not present

## 2015-08-05 DIAGNOSIS — Z79899 Other long term (current) drug therapy: Secondary | ICD-10-CM | POA: Insufficient documentation

## 2015-08-05 HISTORY — PX: COLONOSCOPY: SHX5424

## 2015-08-05 SURGERY — COLONOSCOPY
Anesthesia: Moderate Sedation

## 2015-08-05 MED ORDER — SIMETHICONE 40 MG/0.6ML PO SUSP
ORAL | Status: DC | PRN
  Administered 2015-08-05: 11:00:00

## 2015-08-05 MED ORDER — ONDANSETRON HCL 4 MG/2ML IJ SOLN
INTRAMUSCULAR | Status: DC | PRN
  Administered 2015-08-05: 4 mg via INTRAVENOUS

## 2015-08-05 MED ORDER — SODIUM CHLORIDE 0.9 % IV SOLN: INTRAVENOUS | Status: DC

## 2015-08-05 MED ORDER — ONDANSETRON HCL 4 MG/2ML IJ SOLN
INTRAMUSCULAR | Status: AC
  Filled 2015-08-05: qty 2

## 2015-08-05 MED ORDER — MIDAZOLAM HCL 5 MG/5ML IJ SOLN
INTRAMUSCULAR | Status: DC | PRN
  Administered 2015-08-05: 2 mg via INTRAVENOUS
  Administered 2015-08-05: 1 mg via INTRAVENOUS

## 2015-08-05 MED ORDER — MEPERIDINE HCL 100 MG/ML IJ SOLN
INTRAMUSCULAR | Status: DC | PRN
  Administered 2015-08-05: 25 mg via INTRAVENOUS
  Administered 2015-08-05: 50 mg via INTRAVENOUS

## 2015-08-05 MED ORDER — MIDAZOLAM HCL 5 MG/5ML IJ SOLN
INTRAMUSCULAR | Status: AC
  Filled 2015-08-05: qty 10

## 2015-08-05 MED ORDER — MEPERIDINE HCL 100 MG/ML IJ SOLN
INTRAMUSCULAR | Status: AC
  Filled 2015-08-05: qty 2

## 2015-08-05 NOTE — Interval H&P Note (Signed)
History and Physical Interval Note:  08/05/2015 10:35 AM  Cheryl [name]  has presented today for surgery, with the diagnosis of HX OF COLON POLYPS  The various methods of treatment have been discussed with the patient and family. After consideration of risks, benefits and other options for treatment, the patient has consented to  Procedure(s) with comments: COLONOSCOPY (N/A) - 1000 as a surgical intervention .  The patient's history has been reviewed, patient examined, no change in status, stable for surgery.  I have reviewed the patient's chart and labs.  Questions were answered to the patient's satisfaction.     Cheryl [name]  No change. Surveillance colonoscopy per plan.The risks, benefits, limitations, alternatives and imponderables have been reviewed with the patient. Questions have been answered. All parties are agreeable.

## 2015-08-05 NOTE — H&P (View-Only) (Signed)
Primary Care Physician:  Purvis Kilts, MD  Primary Gastroenterologist:  Garfield Cornea, MD   Chief Complaint  Patient presents with  . set up TCS./ EGD    HPI:  Cheryl [name] is a 69 y.o. female here to schedule surveillance colonoscopy. Her last one was done in July 2011, adenomatous polyp removed. Also with history of hepatic steatosis and Riedel's lobe configuration of liver. LFTs are normal in January 2016. Last imaging of the liver in April 2014, fatty liver but otherwise unremarkable.  BM regular with stool softeners. No melena, brbpr. No heartburn, dysphagia, abdominal pain. No vomiting. Taking aleve as needed only. Tries to limit. Has back, feet, shoulder issues.     Current Outpatient Prescriptions  Medication Sig Dispense Refill  . albuterol (PROVENTIL HFA;VENTOLIN HFA) 108 (90 BASE) MCG/ACT inhaler Inhale 1 puff into the lungs every 6 (six) hours as needed for wheezing or shortness of breath.    . Biotin 1000 MCG tablet Take 1,000 mcg by mouth every morning.     Marland Kitchen CALCIUM-VITAMIN D PO Take 1 tablet by mouth every morning.    . Cyanocobalamin (VITAMIN B-12 PO) Take 1 tablet by mouth every morning.     . fluticasone (FLONASE) 50 MCG/ACT nasal spray Place 1 spray into both nostrils daily as needed for allergies (sinuses).    . montelukast (SINGULAIR) 10 MG tablet Take 10 mg by mouth every morning.    . naproxen sodium (ALEVE) 220 MG tablet Take 440 mg by mouth 2 (two) times daily as needed (pain).     . ondansetron (ZOFRAN ODT) 4 MG disintegrating tablet Take 1 tablet (4 mg total) by mouth every 8 (eight) hours as needed for nausea. 10 tablet 0  . pantoprazole (PROTONIX) 40 MG tablet Take 1 tablet (40 mg total) by mouth daily. 30 tablet 2  . raloxifene (EVISTA) 60 MG tablet Take 60 mg by mouth daily.     No current facility-administered medications for this visit.    Allergies as of 07/09/2015 - Review Complete 07/09/2015  Allergen Reaction Noted  . Prednisone Other  (See Comments) 04/04/2013  . Tramadol Nausea Only 01/21/2015  . Aspirin Nausea And Vomiting   . Celecoxib Rash     Past Medical History  Diagnosis Date  . Acid reflux   . Sleep apnea     had surgery to correct  . Sinus drainage   . Arthritis     Past Surgical History  Procedure Laterality Date  . Knee surgery      x2  . Rotator cuff repair      x2  . Trigger finger release    . Carpal tunnel release    . Colonoscopy  02/15/2005    [id number] small polyps ablated via cold biopsy, one from transverse colon and two from the rectum/Small external hemorrhoids  . Colonoscopy  07/10/2010    [id number] rectum/long redundant colon, polyps in the sigmoid, descending, hepatic flexure/ADENOMATOUS POLYPS. next TCS due 06/2015  . Esophagogastroduodenoscopy  1995    Gastritis  . Colonoscopy  1995    3 polyps, path revealed chronic colitis  . Esophagogastroduodenoscopy N/A 04/16/2013    RMR: HH  . Uvulopalatopharyngoplasty    . Shoulder arthroscopy with rotator cuff repair and subacromial decompression Right 01/21/2015    Procedure: RIGHT SHOULDER ARTHROSCOPIC DEBRIDEMNT OF G-H JOINT AND REMOVAL OF LOOSE BODIES,ARTHROSCOPIC SUBACROMIAL DECOMPRESSION,MINI OPEN RCT REPAIR WITH SUPPLEMENTAL [facility] PATCH;  Surgeon: Garald Balding, MD;  Location: [place];  Service: Orthopedics;  Laterality: Right;    Family History  Problem Relation Age of Onset  . Cancer Mother   . Cancer Father   . Diabetes Sister   . Colon cancer Neg Hx   . Liver disease Neg Hx     History   Social History  . Marital Status: Married    Spouse Name: N/A  . Number of Children: 2  . Years of Education: N/A   Occupational History  . BUS DRIVER    Social History Main Topics  . Smoking status: Former Smoker -- 48 years    Types: Cigarettes  . Smokeless tobacco: Former Systems developer    Quit date: 12/20/2004  . Alcohol Use: No  . Drug Use: No  . Sexual Activity: Not on file   Other Topics Concern  . Not on file    Social History Narrative      ROS:  General: Negative for anorexia, weight loss, fever, chills, fatigue, weakness. Eyes: Negative for vision changes.  ENT: Negative for hoarseness, difficulty swallowing , nasal congestion. CV: Negative for chest pain, angina, palpitations, dyspnea on exertion, peripheral edema.  Respiratory: Negative for dyspnea at rest, dyspnea on exertion, cough, sputum, wheezing.  GI: See history of present illness. GU:  Negative for dysuria, hematuria, urinary incontinence, urinary frequency, nocturnal urination.  MS: Negative for joint pain, low back pain.  Derm: Negative for rash or itching.  Neuro: Negative for weakness, abnormal sensation, seizure, frequent headaches, memory loss, confusion.  Psych: Negative for anxiety, depression, suicidal ideation, hallucinations.  Endo: Negative for unusual weight change.  Heme: Negative for bruising or bleeding. Allergy: Negative for rash or hives.    Physical Examination:  BP 147/81 mmHg  Pulse 80  Temp(Src) 97.3 F (36.3 C)  Ht 4\' 11"  (1.499 m)  Wt 242 lb 6.4 oz (109.952 kg)  BMI 48.93 kg/m2   General: Well-nourished, well-developed in no acute distress.  Head: Normocephalic, atraumatic.   Eyes: Conjunctiva pink, no icterus. Mouth: Oropharyngeal mucosa moist and pink , no lesions erythema or exudate. Neck: Supple without thyromegaly, masses, or lymphadenopathy.  Lungs: Clear to auscultation bilaterally.  Heart: Regular rate and rhythm, no murmurs rubs or gallops.  Abdomen: Bowel sounds are normal, nontender, nondistended, no hepatosplenomegaly or masses, no abdominal bruits or    hernia , no rebound or guarding.   Rectal: not performed Extremities: No lower extremity edema. No clubbing or deformities.  Neuro: Alert and oriented x 4 , grossly normal neurologically.  Skin: Warm and dry, no rash or jaundice.   Psych: Alert and cooperative, normal mood and affect.  Labs: Lab Results  Component Value Date    WBC 8.5 01/14/2015   HGB 12.6 01/14/2015   HCT 40.2 01/14/2015   MCV 95.9 01/14/2015   PLT 370 01/14/2015   Lab Results  Component Value Date   CREATININE 1.18* 01/14/2015   BUN 13 01/14/2015   NA 141 01/14/2015   K 3.9 01/14/2015   CL 108 01/14/2015   CO2 24 01/14/2015   Lab Results  Component Value Date   ALT 21 01/14/2015   AST 32 01/14/2015   ALKPHOS 73 01/14/2015   BILITOT 0.5 01/14/2015     Imaging Studies: No results found.

## 2015-08-05 NOTE — Discharge Instructions (Signed)
Colonoscopy Discharge Instructions  Read the instructions outlined below and refer to this sheet in the next few weeks. These discharge instructions provide you with general information on caring for yourself after you leave the hospital. Your doctor may also give you specific instructions. While your treatment has been planned according to the most current medical practices available, unavoidable complications occasionally occur. If you have any problems or questions after discharge, call Dr. Gala Romney at [phone]. ACTIVITY  You may resume your regular activity, but move at a slower pace for the next 24 hours.   Take frequent rest periods for the next 24 hours.   Walking will help get rid of the air and reduce the bloated feeling in your belly (abdomen).   No driving for 24 hours (because of the medicine (anesthesia) used during the test).    Do not sign any important legal documents or operate any machinery for 24 hours (because of the anesthesia used during the test).  NUTRITION  Drink plenty of fluids.   You may resume your normal diet as instructed by your doctor.   Begin with a light meal and progress to your normal diet. Heavy or fried foods are harder to digest and may make you feel sick to your stomach (nauseated).   Avoid alcoholic beverages for 24 hours or as instructed.  MEDICATIONS  You may resume your normal medications unless your doctor tells you otherwise.  WHAT YOU CAN EXPECT TODAY  Some feelings of bloating in the abdomen.   Passage of more gas than usual.   Spotting of blood in your stool or on the toilet paper.  IF YOU HAD POLYPS REMOVED DURING THE COLONOSCOPY:  No aspirin products for 7 days or as instructed.   No alcohol for 7 days or as instructed.   Eat a soft diet for the next 24 hours.  FINDING OUT THE RESULTS OF YOUR TEST Not all test results are available during your visit. If your test results are not back during the visit, make an appointment  with your caregiver to find out the results. Do not assume everything is normal if you have not heard from your caregiver or the medical facility. It is important for you to follow up on all of your test results.  SEEK IMMEDIATE MEDICAL ATTENTION IF:  You have more than a spotting of blood in your stool.   Your belly is swollen (abdominal distention).   You are nauseated or vomiting.   You have a temperature over 101.   You have abdominal pain or discomfort that is severe or gets worse throughout the day.   Diverticulosis information provided  Recommend repeat surveillance colonoscopy in 5 years   Diverticulosis Diverticulosis is the condition that develops when small pouches (diverticula) form in the wall of your colon. Your colon, or large intestine, is where water is absorbed and stool is formed. The pouches form when the inside layer of your colon pushes through weak spots in the outer layers of your colon. CAUSES  No one knows exactly what causes diverticulosis. RISK FACTORS  Being older than 22. Your risk for this condition increases with age. Diverticulosis is rare in people younger than 40 years. By age 36, almost everyone has it.  Eating a low-fiber diet.  Being frequently constipated.  Being overweight.  Not getting enough exercise.  Smoking.  Taking over-the-counter pain medicines, like aspirin and ibuprofen. SYMPTOMS  Most people with diverticulosis do not have symptoms. DIAGNOSIS  Because diverticulosis often has no  symptoms, health care providers often discover the condition during an exam for other colon problems. In many cases, a health care provider will diagnose diverticulosis while using a flexible scope to examine the colon (colonoscopy). TREATMENT  If you have never developed an infection related to diverticulosis, you may not need treatment. If you have had an infection before, treatment may include:  Eating more fruits, vegetables, and  grains.  Taking a fiber supplement.  Taking a live bacteria supplement (probiotic).  Taking medicine to relax your colon. HOME CARE INSTRUCTIONS   Drink at least 6-8 glasses of water each day to prevent constipation.  Try not to strain when you have a bowel movement.  Keep all follow-up appointments. If you have had an infection before:  Increase the fiber in your diet as directed by your health care provider or dietitian.  Take a dietary fiber supplement if your health care provider approves.  Only take medicines as directed by your health care provider. SEEK MEDICAL CARE IF:   You have abdominal pain.  You have bloating.  You have cramps.  You have not gone to the bathroom in 3 days. SEEK IMMEDIATE MEDICAL CARE IF:   Your pain gets worse.  Yourbloating becomes very bad.  You have a fever or chills, and your symptoms suddenly get worse.  You begin vomiting.  You have bowel movements that are bloody or black. MAKE SURE YOU:  Understand these instructions.  Will watch your condition.  Will get help right away if you are not doing well or get worse. Document Released: 09/02/2004 Document Revised: 12/11/2013 Document Reviewed: 10/31/2013 [facility] Patient Information 2015 [place], [place]. This information is not intended to replace advice given to you by your health care provider. Make sure you discuss any questions you have with your health care provider.

## 2015-08-05 NOTE — Op Note (Signed)
[facility] [address] [place], [address]   COLONOSCOPY PROCEDURE REPORT  PATIENT: Cheryl, [name]  MR#: [id number] BIRTHDATE: [birth year] , 26  yrs. old GENDER: female ENDOSCOPIST: R.  Garfield Cornea, MD FACP [facility] REFERRED [id number] Hilma Favors, M.D. PROCEDURE DATE:  08-29-2015 PROCEDURE:   Colonoscopy, surveillance INDICATIONS:History of colonic adenoma. MEDICATIONS: Versed 3 mg IV and Demerol 75 mg IV in divided doses. Zofran 4 mg IV. ASA CLASS:       Class II  CONSENT: The risks, benefits, alternatives and imponderables including but not limited to bleeding, perforation as well as the possibility of a missed lesion have been reviewed.  The potential for biopsy, lesion removal, etc. have also been discussed. Questions have been answered.  All parties agreeable.  Please see the history and physical in the medical record for more information.  DESCRIPTION OF PROCEDURE:   After the risks benefits and alternatives of the procedure were thoroughly explained, informed consent was obtained.  The digital rectal exam revealed no abnormalities of the rectum.   The [id number]  endoscope was introduced through the anus and advanced to the cecum, which was identified by both the appendix and ileocecal valve. No adverse events experienced.   The quality of the prep was adequate  The instrument was then slowly withdrawn as the colon was fully examined. Estimated blood loss is zero unless otherwise noted in this procedure report.      COLON FINDINGS: Normal-appearing rectal mucosa.  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  A few scattered left-sided diverticula; the remainder of the colonic mucosa appeared normal.  Retroflexed views revealed no abnormalities. .  Withdrawal time=7 minutes 0 seconds.  The scope was withdrawn and the procedure completed. COMPLICATIONS: There were no immediate complications.  ENDOSCOPIC  IMPRESSION: Melanosis coli. Colonic diverticulosis.  RECOMMENDATIONS: Repeat colonoscopy in 5 years for surveillance purposes.  eSigned:  R. Garfield Cornea, MD Rosalita Chessman [facility] 08-29-15 11:05 AM   cc:  CPT CODES: ICD CODES:  The ICD and CPT codes recommended by this software are interpretations from the data that the clinical staff has captured with the software.  The verification of the translation of this report to the ICD and CPT codes and modifiers is the sole responsibility of the health care institution and practicing physician where this report was generated.  [employer] will not be held responsible for the validity of the ICD and CPT codes included on this report.  AMA assumes no liability for data contained or not contained herein. CPT is a Designer, television/film set of the [employer].

## 2015-08-06 ENCOUNTER — Encounter (HOSPITAL_COMMUNITY): Payer: Self-pay | Admitting: Internal Medicine

## 2015-08-15 ENCOUNTER — Other Ambulatory Visit: Payer: Self-pay

## 2015-08-18 DIAGNOSIS — M19011 Primary osteoarthritis, right shoulder: Secondary | ICD-10-CM | POA: Diagnosis not present

## 2015-08-18 DIAGNOSIS — M75121 Complete rotator cuff tear or rupture of right shoulder, not specified as traumatic: Secondary | ICD-10-CM | POA: Diagnosis not present

## 2015-08-18 DIAGNOSIS — M24011 Loose body in right shoulder: Secondary | ICD-10-CM | POA: Diagnosis not present

## 2015-08-18 DIAGNOSIS — M10071 Idiopathic gout, right ankle and foot: Secondary | ICD-10-CM | POA: Diagnosis not present

## 2015-08-18 MED ORDER — PANTOPRAZOLE SODIUM 40 MG PO TBEC: 40.0000 mg | DELAYED_RELEASE_TABLET | Freq: Every day | ORAL | Status: DC

## 2015-09-10 DIAGNOSIS — L668 Other cicatricial alopecia: Secondary | ICD-10-CM | POA: Diagnosis not present

## 2015-09-26 DIAGNOSIS — Z6841 Body Mass Index (BMI) 40.0 and over, adult: Secondary | ICD-10-CM | POA: Diagnosis not present

## 2015-09-26 DIAGNOSIS — D649 Anemia, unspecified: Secondary | ICD-10-CM | POA: Diagnosis not present

## 2015-09-26 DIAGNOSIS — E611 Iron deficiency: Secondary | ICD-10-CM | POA: Diagnosis not present

## 2015-09-26 DIAGNOSIS — Z1389 Encounter for screening for other disorder: Secondary | ICD-10-CM | POA: Diagnosis not present

## 2015-09-26 DIAGNOSIS — Z23 Encounter for immunization: Secondary | ICD-10-CM | POA: Diagnosis not present

## 2015-09-26 DIAGNOSIS — M109 Gout, unspecified: Secondary | ICD-10-CM | POA: Diagnosis not present

## 2015-10-21 DIAGNOSIS — M545 Low back pain: Secondary | ICD-10-CM | POA: Diagnosis not present

## 2015-10-21 DIAGNOSIS — E0789 Other specified disorders of thyroid: Secondary | ICD-10-CM | POA: Diagnosis not present

## 2015-10-21 DIAGNOSIS — E349 Endocrine disorder, unspecified: Secondary | ICD-10-CM | POA: Diagnosis not present

## 2015-10-21 DIAGNOSIS — N951 Menopausal and female climacteric states: Secondary | ICD-10-CM | POA: Diagnosis not present

## 2015-10-21 DIAGNOSIS — M47817 Spondylosis without myelopathy or radiculopathy, lumbosacral region: Secondary | ICD-10-CM | POA: Diagnosis not present

## 2015-10-21 DIAGNOSIS — G894 Chronic pain syndrome: Secondary | ICD-10-CM | POA: Diagnosis not present

## 2015-10-23 DIAGNOSIS — H2513 Age-related nuclear cataract, bilateral: Secondary | ICD-10-CM | POA: Diagnosis not present

## 2015-10-23 DIAGNOSIS — H40013 Open angle with borderline findings, low risk, bilateral: Secondary | ICD-10-CM | POA: Diagnosis not present

## 2015-10-24 DIAGNOSIS — Z1389 Encounter for screening for other disorder: Secondary | ICD-10-CM | POA: Diagnosis not present

## 2015-10-24 DIAGNOSIS — M109 Gout, unspecified: Secondary | ICD-10-CM | POA: Diagnosis not present

## 2015-10-24 DIAGNOSIS — D649 Anemia, unspecified: Secondary | ICD-10-CM | POA: Diagnosis not present

## 2015-10-24 DIAGNOSIS — Z23 Encounter for immunization: Secondary | ICD-10-CM | POA: Diagnosis not present

## 2015-11-04 DIAGNOSIS — Z1231 Encounter for screening mammogram for malignant neoplasm of breast: Secondary | ICD-10-CM | POA: Diagnosis not present

## 2015-11-04 DIAGNOSIS — Z803 Family history of malignant neoplasm of breast: Secondary | ICD-10-CM | POA: Diagnosis not present

## 2015-12-21 DIAGNOSIS — K279 Peptic ulcer, site unspecified, unspecified as acute or chronic, without hemorrhage or perforation: Secondary | ICD-10-CM

## 2015-12-21 HISTORY — PX: CATARACT EXTRACTION, BILATERAL: SHX1313

## 2015-12-21 HISTORY — DX: Peptic ulcer, site unspecified, unspecified as acute or chronic, without hemorrhage or perforation: K27.9

## 2016-02-17 DIAGNOSIS — M47817 Spondylosis without myelopathy or radiculopathy, lumbosacral region: Secondary | ICD-10-CM | POA: Diagnosis not present

## 2016-02-17 DIAGNOSIS — M545 Low back pain: Secondary | ICD-10-CM | POA: Diagnosis not present

## 2016-02-17 DIAGNOSIS — G894 Chronic pain syndrome: Secondary | ICD-10-CM | POA: Diagnosis not present

## 2016-02-19 ENCOUNTER — Encounter: Payer: Self-pay | Admitting: Internal Medicine

## 2016-02-20 ENCOUNTER — Ambulatory Visit (INDEPENDENT_AMBULATORY_CARE_PROVIDER_SITE_OTHER): Payer: Medicare Other | Admitting: Internal Medicine

## 2016-02-20 ENCOUNTER — Encounter: Payer: Self-pay | Admitting: Internal Medicine

## 2016-02-20 VITALS — BP 118/68 | HR 85 | Temp 97.9°F | Resp 12 | Ht 59.0 in | Wt 253.0 lb

## 2016-02-20 DIAGNOSIS — E063 Autoimmune thyroiditis: Secondary | ICD-10-CM | POA: Diagnosis not present

## 2016-02-20 NOTE — Patient Instructions (Signed)
You have Hashimoto's thyroiditis. This is an autoimmune condition affecting your thyroid. In time, it may cause hypothyroidism (underactive thyroid). There is no specific treatment for it, but you can try a Selenium supplement 2x a day.   Let's schedule a lab appointment in 1 week. Stay off Biotin and Vivical until then.   Come back for another visit in 1 year.   Hypothyroidism Hypothyroidism is a disorder of the thyroid. The thyroid is a large gland that is located in the lower front of the neck. The thyroid releases hormones that control how the body works. With hypothyroidism, the thyroid does not make enough of these hormones. CAUSES Causes of hypothyroidism may include:  Viral infections.  Pregnancy.  Your own defense system (immune system) attacking your thyroid.  Certain medicines.  Birth defects.  Past radiation treatments to your head or neck.  Past treatment with radioactive iodine.  Past surgical removal of part or all of your thyroid.  Problems with the gland that is located in the center of your brain (pituitary). SIGNS AND SYMPTOMS Signs and symptoms of hypothyroidism may include:  Feeling as though you have no energy (lethargy).  Inability to tolerate cold.  Weight gain that is not explained by a change in diet or exercise habits.  Dry skin.  Coarse hair.  Menstrual irregularity.  Slowing of thought processes.  Constipation.  Sadness or depression. DIAGNOSIS  Your health care provider may diagnose hypothyroidism with blood tests and ultrasound tests. TREATMENT Hypothyroidism is treated with medicine that replaces the hormones that your body does not make. After you begin treatment, it may take several weeks for symptoms to go away. HOME CARE INSTRUCTIONS   Take medicines only as directed by your health care provider.  If you start taking any new medicines, tell your health care provider.  Keep all follow-up visits as directed by your health  care provider. This is important. As your condition improves, your dosage needs may change. You will need to have blood tests regularly so that your health care provider can watch your condition. SEEK MEDICAL CARE IF:  Your symptoms do not get better with treatment.  You are taking thyroid replacement medicine and:  You sweat excessively.  You have tremors.  You feel anxious.  You lose weight rapidly.  You cannot tolerate heat.  You have emotional swings.  You have diarrhea.  You feel weak. SEEK IMMEDIATE MEDICAL CARE IF:   You develop chest pain.  You develop an irregular heartbeat.  You develop a rapid heartbeat.   This information is not intended to replace advice given to you by your health care provider. Make sure you discuss any questions you have with your health care provider.   Document Released: 12/06/2005 Document Revised: 12/27/2014 Document Reviewed: 04/23/2014 Elsevier Interactive Patient Education [employer].

## 2016-02-20 NOTE — Progress Notes (Signed)
Patient ID: Cheryl [name], female   DOB: Mar 15, 1946, 70 y.o.   MRN: [id number]   HPI  Cheryl [name] is a 70 y.o.-year-old female, referred by her PCP,  Dr. Micheline Rough, for management of euthyroid Hashimoto's thyroiditis.  Pt. has been dx with Hashimoto's thyroiditis (without hypothyroidism) in 10/2015; she is not on Levothyroxine.  I reviewed pt's thyroid tests - per records from PCP: 10/21/2015:  - TSH 1.020, free T3 2.0 (2-4.4) - TPO antibodies 159 (0-34)  Pt describes: - + weight gain - + fatigue - no cold intolerance,, + hot flashes - no depression - + constipation - + dry skin - + hair loss - sees dermatology  Pt denies feeling nodules in neck, hoarseness, dysphagia/odynophagia, SOB with lying down.  She has + FH of thyroid disorders in: sisters, mother. No FH of thyroid cancer. No FH of autoimmune ds. No h/o radiation tx to head or neck. No recent use of iodine supplements.  She is on Biotin 5000 mcg daily. Last dose: this am.  I reviewed her chart and she also has a history of OSA >> had surgery for it, osteopenia (on Raloxifene), gout, hair loss.  ROS: Constitutional: + see history of present illness, + excessive urination, + poor sleep Eyes: + blurry vision, no xerophthalmia ENT: no sore throat, no nodules palpated in throat, no dysphagia/odynophagia, no hoarseness Cardiovascular: no CP/+ SOB/no palpitations/+ leg swelling Respiratory: no cough/+ SOB Gastrointestinal: no N/V/D/+C/+ acid reflux Musculoskeletal: + both: muscle/joint aches Skin: no rashes, + hair loss, + itching, + rash Neurological: no tremors/numbness/tingling/dizziness, + headache Psychiatric: no depression/anxiety  Past Medical History  Diagnosis Date  . Acid reflux   . Sleep apnea     had surgery to correct  . Sinus drainage   . Arthritis    Past Surgical History  Procedure Laterality Date  . Knee surgery      x2  . Rotator cuff repair      x2  . Trigger finger release    .  Carpal tunnel release    . Colonoscopy  02/15/2005    [id number] small polyps ablated via cold biopsy, one from transverse colon and two from the rectum/Small external hemorrhoids  . Colonoscopy  07/10/2010    [id number] rectum/long redundant colon, polyps in the sigmoid, descending, hepatic flexure/ADENOMATOUS POLYPS. next TCS due 06/2015  . Esophagogastroduodenoscopy  1995    Gastritis  . Colonoscopy  1995    3 polyps, path revealed chronic colitis  . Esophagogastroduodenoscopy N/A 04/16/2013    RMR: HH  . Uvulopalatopharyngoplasty    . Shoulder arthroscopy with rotator cuff repair and subacromial decompression Right 01/21/2015    Procedure: RIGHT SHOULDER ARTHROSCOPIC DEBRIDEMNT OF G-H JOINT AND REMOVAL OF LOOSE BODIES,ARTHROSCOPIC SUBACROMIAL DECOMPRESSION,MINI OPEN RCT REPAIR WITH SUPPLEMENTAL [facility] PATCH;  Surgeon: Garald Balding, MD;  Location: [place];  Service: Orthopedics;  Laterality: Right;  . Colonoscopy N/A 08/05/2015    Procedure: COLONOSCOPY;  Surgeon: Daneil Dolin, MD;  [facility];  Service: Endoscopy;  Laterality: N/A;  1000   Social History   Social History  . Marital Status: Married    Spouse Name: N/A  . Number of Children: 2   Occupational History  . BUS DRIVER - retired    Social History Main Topics  . Smoking status: Former Smoker -- 48 years    Types: Cigarettes  . Smokeless tobacco: Former Systems developer    Quit date: 12/20/2004  . Alcohol Use: No  . Drug  Use: No   Current Outpatient Prescriptions on File Prior to Visit  Medication Sig Dispense Refill  . albuterol (PROVENTIL HFA;VENTOLIN HFA) 108 (90 BASE) MCG/ACT inhaler Inhale 1 puff into the lungs every 6 (six) hours as needed for wheezing or shortness of breath.    . allopurinol (ZYLOPRIM) 100 MG tablet Take 100 mg by mouth 3 (three) times daily with meals.    . Biotin 1000 MCG tablet Take 1,000 mcg by mouth every morning.     Marland Kitchen CALCIUM-VITAMIN D PO Take 1 tablet by mouth every morning.    .  Cyanocobalamin (VITAMIN B-12 PO) Take 1 tablet by mouth every morning.     Marland Kitchen esomeprazole (NEXIUM) 40 MG capsule Take 40 mg by mouth daily at 12 noon.    . febuxostat (ULORIC) 40 MG tablet Take 40 mg by mouth daily.    . fluticasone (FLONASE) 50 MCG/ACT nasal spray Place 1 spray into both nostrils daily as needed for allergies (sinuses).    . montelukast (SINGULAIR) 10 MG tablet Take 10 mg by mouth every morning.    . naproxen sodium (ALEVE) 220 MG tablet Take 440 mg by mouth 2 (two) times daily as needed (pain).     . raloxifene (EVISTA) 60 MG tablet Take 60 mg by mouth daily.    Marland Kitchen tolterodine (DETROL) 1 MG tablet Take 1 mg by mouth 2 (two) times daily.     No current facility-administered medications on file prior to visit.   Allergies  Allergen Reactions  . Prednisone Other (See Comments)    Hallucinations   . Tramadol Nausea Only  . Aspirin Nausea And Vomiting  . Celecoxib Rash   Family History  Problem Relation Age of Onset  . Cancer Mother   . Cancer Father   . Diabetes Sister   . Colon cancer Neg Hx   . Liver disease Neg Hx    PE: BP 118/68 mmHg  Pulse 85  Temp(Src) 97.9 F (36.6 C) (Oral)  Resp 12  Ht 4\' 11"  (1.499 m)  Wt 253 lb (114.76 kg)  BMI 51.07 kg/m2  SpO2 93% Wt Readings from Last 3 Encounters:  02/20/16 253 lb (114.76 kg)  08/05/15 232 lb (105.235 kg)  07/09/15 242 lb 6.4 oz (109.952 kg)   Constitutional: obesity class III, in NAD Eyes: PERRLA, EOMI, no exophthalmos ENT: moist mucous membranes, slight symmetric thyromegaly, no cervical lymphadenopathy Cardiovascular: RRR, No MRG Respiratory: CTA B Gastrointestinal: abdomen soft, NT, ND, BS+ Musculoskeletal: no deformities, strength intact in all 4 Skin: moist, warm, no rashes Neurological: no tremor with outstretched hands, DTR normal in all 4  ASSESSMENT: 1. Hashimoto thyroiditis  PLAN: 1. Hashimoto thyroiditis - we had a long discussion about her Hashimoto thyroiditis diagnosis. I explained  that this is an autoimmune disorder, in which she develops antibodies against her own thyroid. The antibodies bind to the thyroid tissue and cause inflammation, and, eventually, destruction of the gland and hypothyroidism. We don't know how long this process [name] be, it [name] last from months to years. As of now, based on the last results that I have, her thyroid tests are normal.  - I also explained that thyroid enlargement especially at the beginning of her Hashimoto thyroiditis course is not uncommon, and it has a waxing and waning character. She denies neck compression symptoms. - We discussed about treatment for Hashimoto thyroiditis, which is actually limited to thyroid hormones in case her TFTs are abnormal. Supplements like selenium has been tried with various results,  some showing improvement in the TPO antibodies. However, there are no randomized controlled trials of this are consistent results between trials.  - we need to repeat her thyroid tests 5 days after she stops biotin, since this [name] interact with the TFTs assay. She will come back for these labs. - patient was given the [facility] handout about Hashimoto thyroiditis - I advised the patient to return in a year for a visit and repeat labs. However, she should let me know if she develops sxs of hypothyroidism or neck compression symptoms, in that case, we will need to repeat the TFTs - I advised pt to join my chart and I will send her the results through there. She refuses, prefers to be called with the results.  CC: Dr Ronita Hipps

## 2016-02-27 ENCOUNTER — Other Ambulatory Visit (INDEPENDENT_AMBULATORY_CARE_PROVIDER_SITE_OTHER): Payer: Medicare Other

## 2016-02-27 DIAGNOSIS — E063 Autoimmune thyroiditis: Secondary | ICD-10-CM

## 2016-02-27 LAB — TSH: TSH: 2.82 u[IU]/mL (ref 0.35–4.50)

## 2016-02-27 LAB — T3, FREE: T3, Free: 4.2 pg/mL (ref 2.3–4.2)

## 2016-02-27 LAB — T4, FREE: Free T4: 1.03 ng/dL (ref 0.60–1.60)

## 2016-03-10 DIAGNOSIS — L668 Other cicatricial alopecia: Secondary | ICD-10-CM | POA: Diagnosis not present

## 2016-03-31 ENCOUNTER — Encounter (HOSPITAL_COMMUNITY): Payer: Self-pay | Admitting: Emergency Medicine

## 2016-03-31 ENCOUNTER — Emergency Department (HOSPITAL_COMMUNITY)
Admission: EM | Admit: 2016-03-31 | Discharge: 2016-03-31 | Disposition: A | Discharge status: Home / Self Care | Payer: Medicare Other | Attending: Emergency Medicine | Admitting: Emergency Medicine

## 2016-03-31 ENCOUNTER — Emergency Department (HOSPITAL_COMMUNITY): Payer: Medicare Other

## 2016-03-31 DIAGNOSIS — K219 Gastro-esophageal reflux disease without esophagitis: Secondary | ICD-10-CM | POA: Diagnosis not present

## 2016-03-31 DIAGNOSIS — S39012A Strain of muscle, fascia and tendon of lower back, initial encounter: Secondary | ICD-10-CM | POA: Diagnosis not present

## 2016-03-31 DIAGNOSIS — Z8709 Personal history of other diseases of the respiratory system: Secondary | ICD-10-CM | POA: Insufficient documentation

## 2016-03-31 DIAGNOSIS — X58XXXA Exposure to other specified factors, initial encounter: Secondary | ICD-10-CM | POA: Insufficient documentation

## 2016-03-31 DIAGNOSIS — M545 Low back pain, unspecified: Secondary | ICD-10-CM

## 2016-03-31 DIAGNOSIS — M546 Pain in thoracic spine: Secondary | ICD-10-CM | POA: Diagnosis not present

## 2016-03-31 DIAGNOSIS — Z7951 Long term (current) use of inhaled steroids: Secondary | ICD-10-CM | POA: Diagnosis not present

## 2016-03-31 DIAGNOSIS — T148 Other injury of unspecified body region: Secondary | ICD-10-CM | POA: Diagnosis not present

## 2016-03-31 DIAGNOSIS — Z87891 Personal history of nicotine dependence: Secondary | ICD-10-CM | POA: Insufficient documentation

## 2016-03-31 DIAGNOSIS — T148XXA Other injury of unspecified body region, initial encounter: Secondary | ICD-10-CM

## 2016-03-31 DIAGNOSIS — Z8669 Personal history of other diseases of the nervous system and sense organs: Secondary | ICD-10-CM | POA: Insufficient documentation

## 2016-03-31 DIAGNOSIS — M1990 Unspecified osteoarthritis, unspecified site: Secondary | ICD-10-CM | POA: Diagnosis not present

## 2016-03-31 DIAGNOSIS — M47814 Spondylosis without myelopathy or radiculopathy, thoracic region: Secondary | ICD-10-CM | POA: Diagnosis not present

## 2016-03-31 DIAGNOSIS — Z79899 Other long term (current) drug therapy: Secondary | ICD-10-CM | POA: Diagnosis not present

## 2016-03-31 MED ORDER — IBUPROFEN 400 MG PO TABS
800.0000 mg | ORAL_TABLET | Freq: Once | ORAL | Status: AC
  Administered 2016-03-31: 800 mg via ORAL
  Filled 2016-03-31: qty 2

## 2016-03-31 MED ORDER — CYCLOBENZAPRINE HCL 5 MG PO TABS: 5.0000 mg | ORAL_TABLET | Freq: Two times a day (BID) | ORAL | Status: DC | PRN

## 2016-03-31 MED ORDER — CYCLOBENZAPRINE HCL 10 MG PO TABS
5.0000 mg | ORAL_TABLET | Freq: Once | ORAL | Status: AC
  Administered 2016-03-31: 5 mg via ORAL
  Filled 2016-03-31: qty 1

## 2016-03-31 MED ORDER — HYDROCODONE-ACETAMINOPHEN 5-325 MG PO TABS: 1.0000 | ORAL_TABLET | Freq: Four times a day (QID) | ORAL | Status: DC | PRN

## 2016-03-31 NOTE — Discharge Instructions (Signed)
Imaging of your back today does not reveal any acute abnormalities. There is no indication for further imaging at this time. This is likely a muscle strain. You may take Norco every 6 hours as needed for pain. You may also take Flexeril twice daily as needed for pain. Please follow-up with your primary care doctor in 1 week for reevaluation.  Back Pain, Adult Back pain is very common in adults.The cause of back pain is rarely dangerous and the pain often gets better over time.The cause of your back pain may not be known. Some common causes of back pain include:  Strain of the muscles or ligaments supporting the spine.  Wear and tear (degeneration) of the spinal disks.  Arthritis.  Direct injury to the back. For many people, back pain may return. Since back pain is rarely dangerous, most people can learn to manage this condition on their own. HOME CARE INSTRUCTIONS Watch your back pain for any changes. The following actions may help to lessen any discomfort you are feeling:  Remain active. It is stressful on your back to sit or stand in one place for long periods of time. Do not sit, drive, or stand in one place for more than 30 minutes at a time. Take short walks on even surfaces as soon as you are able.Try to increase the length of time you walk each day.  Exercise regularly as directed by your health care provider. Exercise helps your back heal faster. It also helps avoid future injury by keeping your muscles strong and flexible.  Do not stay in bed.Resting more than 1-2 days can delay your recovery.  Pay attention to your body when you bend and lift. The most comfortable positions are those that put less stress on your recovering back. Always use proper lifting techniques, including:  Bending your knees.  Keeping the load close to your body.  Avoiding twisting.  Find a comfortable position to sleep. Use a firm mattress and lie on your side with your knees slightly bent. If you  lie on your back, put a pillow under your knees.  Avoid feeling anxious or stressed.Stress increases muscle tension and can worsen back pain.It is important to recognize when you are anxious or stressed and learn ways to manage it, such as with exercise.  Take medicines only as directed by your health care provider. Over-the-counter medicines to reduce pain and inflammation are often the most helpful.Your health care provider may prescribe muscle relaxant drugs.These medicines help dull your pain so you can more quickly return to your normal activities and healthy exercise.  Apply ice to the injured area:  Put ice in a plastic bag.  Place a towel between your skin and the bag.  Leave the ice on for 20 minutes, 2-3 times a day for the first 2-3 days. After that, ice and heat may be alternated to reduce pain and spasms.  Maintain a healthy weight. Excess weight puts extra stress on your back and makes it difficult to maintain good posture. SEEK MEDICAL CARE IF:  You have pain that is not relieved with rest or medicine.  You have increasing pain going down into the legs or buttocks.  You have pain that does not improve in one week.  You have night pain.  You lose weight.  You have a fever or chills. SEEK IMMEDIATE MEDICAL CARE IF:   You develop new bowel or bladder control problems.  You have unusual weakness or numbness in your arms or legs.  You develop nausea or vomiting.  You develop abdominal pain.  You feel faint.   This information is not intended to replace advice given to you by your health care provider. Make sure you discuss any questions you have with your health care provider.   Document Released: 12/06/2005 Document Revised: 12/27/2014 Document Reviewed: 04/09/2014 Elsevier Interactive Patient Education [employer].

## 2016-03-31 NOTE — ED Notes (Signed)
The patient stepped down on her right leg coming down some steps and she thinks she injured her back.  She denies any urinary symptoms.  She says she has taken ibuprofen and it has not helped.

## 2016-03-31 NOTE — ED Provider Notes (Signed)
CSN: [id number]     Arrival date & time 03/31/16  1850 History  By signing my name below, I, Randa Evens, attest that this documentation has been prepared under the direction and in the presence of [employer], PA-C. Electronically Signed: Randa Evens, ED Scribe. 03/31/2016. 10:02 PM.      Chief Complaint  Patient presents with  . Back Pain    The patient stepped down on her right leg coming down some steps and she thinks she injured her back.  She denies any urinary symptoms.  She says she has taken ibuprofen and it has not helped.    Patient is a 70 y.o. female presenting with back pain. The history is provided by the patient. No language interpreter was used.  Back Pain Associated symptoms: no chest pain, no dysuria, no numbness and no weakness    HPI Comments: BILINDA CUBBERLEY is a 70 y.o. female who presents to the Emergency Department complaining of constant, shooting, right sided upper back pain onset 1 day prior. Pt states that the pain is radiating down towards her lower back into her right thigh. Pt states that the onset of pain began while stepping off a step. She states that ambulating make the pain worse. Pt states she has tried ibuprofen with no relief. Pt denies injury or fall. Denies numbness, weakness,CP, SOB, dysuria, hematuria or bowel/bladder incontinence. Pt reports HX of chronic back pain and osteoporosis. Denies Hx of IV drug use or personal of cancer.    Past Medical History  Diagnosis Date  . Acid reflux   . Sleep apnea     had surgery to correct  . Sinus drainage   . Arthritis    Past Surgical History  Procedure Laterality Date  . Knee surgery      x2  . Rotator cuff repair      x2  . Trigger finger release    . Carpal tunnel release    . Colonoscopy  02/15/2005    [id number] small polyps ablated via cold biopsy, one from transverse colon and two from the rectum/Small external hemorrhoids  . Colonoscopy  07/10/2010    [id number] rectum/long redundant  colon, polyps in the sigmoid, descending, hepatic flexure/ADENOMATOUS POLYPS. next TCS due 06/2015  . Esophagogastroduodenoscopy  1995    Gastritis  . Colonoscopy  1995    3 polyps, path revealed chronic colitis  . Esophagogastroduodenoscopy N/A 04/16/2013    RMR: HH  . Uvulopalatopharyngoplasty    . Shoulder arthroscopy with rotator cuff repair and subacromial decompression Right 01/21/2015    Procedure: RIGHT SHOULDER ARTHROSCOPIC DEBRIDEMNT OF G-H JOINT AND REMOVAL OF LOOSE BODIES,ARTHROSCOPIC SUBACROMIAL DECOMPRESSION,MINI OPEN RCT REPAIR WITH SUPPLEMENTAL [facility] PATCH;  Surgeon: Garald Balding, MD;  Location: [place];  Service: Orthopedics;  Laterality: Right;  . Colonoscopy N/A 08/05/2015    Procedure: COLONOSCOPY;  Surgeon: Daneil Dolin, MD;  [facility];  Service: Endoscopy;  Laterality: N/A;  1000   Family History  Problem Relation Age of Onset  . Cancer Mother   . Cancer Father   . Diabetes Sister   . Colon cancer Neg Hx   . Liver disease Neg Hx    Social History  Substance Use Topics  . Smoking status: Former Smoker -- 48 years    Types: Cigarettes  . Smokeless tobacco: Former Systems developer    Quit date: 12/20/2004  . Alcohol Use: No   OB History    No data available  Review of Systems  Respiratory: Negative for shortness of breath.   Cardiovascular: Negative for chest pain.  Genitourinary: Negative for dysuria and hematuria.  Musculoskeletal: Positive for back pain.  Neurological: Negative for weakness and numbness.  All other systems reviewed and are negative.     Allergies  Prednisone; Tramadol; Aspirin; and Celecoxib  Home Medications   Prior to Admission medications   Medication Sig Start Date End Date Taking? Authorizing Provider  albuterol (PROVENTIL HFA;VENTOLIN HFA) 108 (90 BASE) MCG/ACT inhaler Inhale 1 puff into the lungs every 6 (six) hours as needed for wheezing or shortness of breath.    Historical Provider, MD  allopurinol  (ZYLOPRIM) 100 MG tablet Take 100 mg by mouth 3 (three) times daily with meals.    Historical Provider, MD  Biotin 1000 MCG tablet Take 1,000 mcg by mouth every morning.     Historical Provider, MD  CALCIUM-VITAMIN D PO Take 1 tablet by mouth every morning.    Historical Provider, MD  Cyanocobalamin (VITAMIN B-12 PO) Take 1 tablet by mouth every morning.     Historical Provider, MD  esomeprazole (NEXIUM) 40 MG capsule Take 40 mg by mouth daily at 12 noon.    Historical Provider, MD  febuxostat (ULORIC) 40 MG tablet Take 40 mg by mouth daily.    Historical Provider, MD  fluticasone (FLONASE) 50 MCG/ACT nasal spray Place 1 spray into both nostrils daily as needed for allergies (sinuses).    Historical Provider, MD  montelukast (SINGULAIR) 10 MG tablet Take 10 mg by mouth every morning.    Historical Provider, MD  naproxen sodium (ALEVE) 220 MG tablet Take 440 mg by mouth 2 (two) times daily as needed (pain).     Historical Provider, MD  pantoprazole (PROTONIX) 40 MG tablet Take 40 mg by mouth daily.    Historical Provider, MD  raloxifene (EVISTA) 60 MG tablet Take 60 mg by mouth daily.    Historical Provider, MD  tolterodine (DETROL) 1 MG tablet Take 1 mg by mouth 2 (two) times daily.    Historical Provider, MD   BP 157/95 mmHg  Pulse 81  Temp(Src) 98.5 F (36.9 C) (Oral)  Resp 18  SpO2 97%   Physical Exam  Constitutional: She is oriented to person, place, and time. She appears well-developed and well-nourished.  HENT:  Head: Atraumatic.  Eyes: Conjunctivae are normal.  Cardiovascular: Normal rate, regular rhythm, normal heart sounds and intact distal pulses.   Pulses:      Dorsalis pedis pulses are 2+ on the right side, and 2+ on the left side.  Pulmonary/Chest: Effort normal and breath sounds normal.  Abdominal: Soft. Bowel sounds are normal. She exhibits no distension. There is no tenderness.  Musculoskeletal: She exhibits tenderness.       Back:  No cervical, thoracic, lumbar, or  sacral midline tenderness. No spinous process tenderness.  No step offs. No crepitus. No tenderness over iliac spines bilaterally.   Neurological: She is alert and oriented to person, place, and time.  No saddle anesthesia. Strength and sensation intact bilaterally throughout lower extremities.  Skin: Skin is warm and dry.  Psychiatric: She has a normal mood and affect. Her behavior is normal.    ED Course  Procedures (including critical care time) DIAGNOSTIC STUDIES: Oxygen Saturation is 97% on RA, normal by my interpretation.    COORDINATION OF CARE: 8:46 PM-Discussed treatment plan with pt at bedside and pt agreed to plan.     Labs Review Labs Reviewed - No data  to display  Imaging Review Dg Thoracic Spine 2 View  03/31/2016  CLINICAL DATA:  Thoracic pain on right side under scapula radiating to right lower back. EXAM: THORACIC SPINE 2 VIEWS COMPARISON:  None. FINDINGS: There is no evidence of thoracic spine fracture. Alignment is normal. No other significant bone abnormalities are identified. IMPRESSION: Negative. Electronically Signed   By: Rolm Baptise M.D.   On: 03/31/2016 21:39   Dg Lumbar Spine Complete  03/31/2016  CLINICAL DATA:  Right thoracic pain radiating to right lower back. No known injury. EXAM: LUMBAR SPINE - COMPLETE 4+ VIEW COMPARISON:  None. FINDINGS: Degenerative facet disease in the lower lumbar spine. Slight anterolisthesis of L5 on S1. No fracture. Disc spaces are maintained. SI joints are symmetric and unremarkable. IMPRESSION: Degenerative facet disease in the lower lumbar spine. No acute findings. Electronically Signed   By: Rolm Baptise M.D.   On: 03/31/2016 21:39      EKG Interpretation None      MDM   Final diagnoses:  Right-sided thoracic back pain  Right-sided low back pain without sciatica  Muscle strain   Patient presents with right sided thoracic and right sided low back pain after stepping down from a step.  No b/b incontinence, abdominal  pain, CP, SOB.  Normal neuro exam.  TTP along thoracic and lumbar musculature.  No midline tenderness.  No step offs or crepitus.  Patient given ibuprofen and flexeril in ED.  Given age of 27 and acute back pain, will obtain imaging of thoracic and lumbar spine to evaluate for fracture or malignancy.  Plain films negative for acute abnormalities, there is degenerative facet disease in lower lumbar spine.  Plan to discharge home with short course norco and flexeril.  Follow up PCP in 1 week.  Dicussed return precautions.  Patient agrees and acknowledges the above plan for discharge.   I personally performed the services described in this documentation, which was scribed in my presence. The recorded information has been reviewed and is accurate.      Gloriann Loan, PA-C 03/31/16 2205  Merrily Pew, MD 04/03/16 1236

## 2016-04-05 DIAGNOSIS — Z0000 Encounter for general adult medical examination without abnormal findings: Secondary | ICD-10-CM | POA: Diagnosis not present

## 2016-04-05 DIAGNOSIS — Z6841 Body Mass Index (BMI) 40.0 and over, adult: Secondary | ICD-10-CM | POA: Diagnosis not present

## 2016-04-05 DIAGNOSIS — Z23 Encounter for immunization: Secondary | ICD-10-CM | POA: Diagnosis not present

## 2016-04-05 DIAGNOSIS — K219 Gastro-esophageal reflux disease without esophagitis: Secondary | ICD-10-CM | POA: Diagnosis not present

## 2016-04-20 ENCOUNTER — Encounter: Payer: Self-pay | Admitting: Internal Medicine

## 2016-05-10 DIAGNOSIS — M5030 Other cervical disc degeneration, unspecified cervical region: Secondary | ICD-10-CM | POA: Diagnosis not present

## 2016-05-10 DIAGNOSIS — M542 Cervicalgia: Secondary | ICD-10-CM | POA: Diagnosis not present

## 2016-05-11 ENCOUNTER — Other Ambulatory Visit: Payer: Self-pay

## 2016-05-11 ENCOUNTER — Ambulatory Visit (INDEPENDENT_AMBULATORY_CARE_PROVIDER_SITE_OTHER): Payer: Medicare Other | Admitting: Nurse Practitioner

## 2016-05-11 ENCOUNTER — Encounter: Payer: Self-pay | Admitting: Nurse Practitioner

## 2016-05-11 VITALS — BP 154/75 | HR 86 | Temp 97.6°F | Ht 59.0 in | Wt 251.4 lb

## 2016-05-11 DIAGNOSIS — K219 Gastro-esophageal reflux disease without esophagitis: Secondary | ICD-10-CM | POA: Diagnosis not present

## 2016-05-11 DIAGNOSIS — K5900 Constipation, unspecified: Secondary | ICD-10-CM | POA: Diagnosis not present

## 2016-05-11 NOTE — Progress Notes (Signed)
Referring Provider: Sharilyn Sites, MD Primary Care Physician:  Purvis Kilts, MD Primary GI:  Dr. Gala Romney  Chief Complaint  Patient presents with  . set up EGD  . Gastroesophageal Reflux  . Constipation    HPI:   Cheryl [name] is a 70 y.o. female who presents on referral from primary care for evaluation of persistent GERD. Patient last seen by primary care on 04/05/2016 for annual wellness visit and for persistent reflux. Patient described burning epigastric pain with sour taste in the posterior pharynx already on Protonix. They increased her Protonix to twice daily and referred back to GI for consideration of upper endoscopy. Colonoscopy is currently up-to-date. Last endoscopy completed 2014 which found hiatal hernia, no endoscopic explanation for right upper quadrant abdominal pain. At that time recommended stop Nexium, trial Protonix 40 mg daily. Also with a history of fatty liver, last seen for this by our office on 07/09/2015 with noted normal LFTs, recommendation for 1-2 pound weight loss until ideal body weight, low fat/cholesterol diet, avoid sugary substances, limit alcohol intake.  Today she states she began having worsening GERD symptoms, bloating, constipation. GERD started getting worse a couple weeks ago. Was having symptoms about once or twice a week on Protonix daily. Protonix bid is helping some, but still having breakthrough about once a week. Some flares are very significant. Symptoms include epigastric burning, bitter taste, has to lay down. Also with constipation and bloating, will only have a bowel movement every 2-3 days. Currently taking daily stool softeners with a good bowel movement in the morning and maybe 1-2 other small ones later in the day. Has sensation of incomplete emptying and persistent bloating. Denies hematochezia, melena, N/V, fever, chills, unintentional weight loss. Denies chest pain, dyspnea, dizziness, lightheadedness, syncope, near syncope. Denies  any other upper or lower GI symptoms.  Rare use of Norco, "doesn't even keep on hand." Takes occasional NSAIDs (Ibuprofen or Aleve) about twice a month, a little more these past couple weeks due to arthritis pain.  Past Medical History  Diagnosis Date  . Acid reflux   . Sleep apnea     had surgery to correct  . Sinus drainage   . Arthritis     Past Surgical History  Procedure Laterality Date  . Knee surgery      x2  . Rotator cuff repair      x2  . Trigger finger release    . Carpal tunnel release    . Colonoscopy  02/15/2005    [id number] small polyps ablated via cold biopsy, one from transverse colon and two from the rectum/Small external hemorrhoids  . Colonoscopy  07/10/2010    [id number] rectum/long redundant colon, polyps in the sigmoid, descending, hepatic flexure/ADENOMATOUS POLYPS. next TCS due 06/2015  . Esophagogastroduodenoscopy  1995    Gastritis  . Colonoscopy  1995    3 polyps, path revealed chronic colitis  . Esophagogastroduodenoscopy N/A 04/16/2013    RMR: [facility]  . Uvulopalatopharyngoplasty    . Shoulder arthroscopy with rotator cuff repair and subacromial decompression Right 01/21/2015    Procedure: RIGHT SHOULDER ARTHROSCOPIC DEBRIDEMNT OF G-H JOINT AND REMOVAL OF LOOSE BODIES,ARTHROSCOPIC SUBACROMIAL DECOMPRESSION,MINI OPEN RCT REPAIR WITH SUPPLEMENTAL [facility] PATCH;  Surgeon: Garald Balding, MD;  Location: [facility];  Service: Orthopedics;  Laterality: Right;  . Colonoscopy N/A 08/05/2015    Procedure: COLONOSCOPY;  Surgeon: Daneil Dolin, MD;  Location[facility];  Service: Endoscopy;  Laterality: N/A;  1000    Current  Outpatient Prescriptions  Medication Sig Dispense Refill  . albuterol (PROVENTIL HFA;VENTOLIN HFA) 108 (90 BASE) MCG/ACT inhaler Inhale 1 puff into the lungs every 6 (six) hours as needed for wheezing or shortness of breath.    . allopurinol (ZYLOPRIM) 100 MG tablet Take 100 mg by mouth 3 (three) times daily with meals.    . Biotin 1000 MCG  tablet Take 1,000 mcg by mouth every morning.     Marland Kitchen CALCIUM-VITAMIN D PO Take 1 tablet by mouth every morning.    . Cyanocobalamin (VITAMIN B-12 PO) Take 1 tablet by mouth every morning.     . cyclobenzaprine (FLEXERIL) 5 MG tablet Take 1 tablet (5 mg total) by mouth 2 (two) times daily as needed for muscle spasms. 8 tablet 0  . febuxostat (ULORIC) 40 MG tablet Take 40 mg by mouth daily.    . fluticasone (FLONASE) 50 MCG/ACT nasal spray Place 1 spray into both nostrils daily as needed for allergies (sinuses).    Marland Kitchen HYDROcodone-acetaminophen (NORCO/VICODIN) 5-325 MG tablet Take 1 tablet by mouth every 6 (six) hours as needed. 6 tablet 0  . montelukast (SINGULAIR) 10 MG tablet Take 10 mg by mouth every morning.    . naproxen sodium (ALEVE) 220 MG tablet Take 440 mg by mouth 2 (two) times daily as needed (pain).     . pantoprazole (PROTONIX) 40 MG tablet Take 40 mg by mouth daily.    . raloxifene (EVISTA) 60 MG tablet Take 60 mg by mouth daily.    Marland Kitchen tolterodine (DETROL) 1 MG tablet Take 1 mg by mouth 2 (two) times daily.     No current facility-administered medications for this visit.    Allergies as of 05/11/2016 - Review Complete 05/11/2016  Allergen Reaction Noted  . Prednisone Other (See Comments) 04/04/2013  . Tramadol Nausea Only 01/21/2015  . Aspirin Nausea And Vomiting   . Celecoxib Rash     Family History  Problem Relation Age of Onset  . Cancer Mother   . Cancer Father   . Diabetes Sister   . Colon cancer Neg Hx   . Liver disease Neg Hx     Social History   Social History  . Marital Status: Married    Spouse Name: N/A  . Number of Children: 2  . Years of Education: N/A   Occupational History  . BUS DRIVER    Social History Main Topics  . Smoking status: Former Smoker -- 48 years    Types: Cigarettes    Quit date: 05/11/2005  . Smokeless tobacco: None  . Alcohol Use: No  . Drug Use: No  . Sexual Activity: Not Asked   Other Topics Concern  . None   Social  History Narrative    Review of Systems: General: Negative for anorexia, weight loss, fever, chills, fatigue, weakness. ENT: Negative for hoarseness, difficulty swallowing. CV: Negative for chest pain, angina, palpitations, peripheral edema.  Respiratory: Negative for dyspnea at rest, cough, sputum, wheezing.  GI: See history of present illness. MS: Admits chronic shoulder pain from arthritis.  Endo: Negative for unusual weight change.  Heme: Negative for bruising or bleeding.   Physical Exam: BP 154/75 mmHg  Pulse 86  Temp(Src) 97.6 F (36.4 C)  Ht 4\' 11"  (1.499 m)  Wt 251 lb 6.4 oz (114.034 kg)  BMI 50.75 kg/m2 General:   Alert and oriented. Pleasant and cooperative. Well-nourished and well-developed.  Head:  Normocephalic and atraumatic. Eyes:  Without icterus, sclera clear and conjunctiva pink.  Ears:  Normal auditory acuity. Cardiovascular:  S1, S2 present without murmurs appreciated. Normal biolateral DP pulses noted. Extremities without clubbing or edema. Respiratory:  Clear to auscultation bilaterally. No wheezes, rales, or rhonchi. No distress.  Gastrointestinal:  +BS, soft, and non-distended. Noted mild LLQ and LUQ abdominal TTP.No HSM noted. No guarding or rebound. No masses appreciated.  Rectal:  Deferred  Musculoskalatal:  Symmetrical without gross deformities. Neurologic:  Alert and oriented x4;  grossly normal neurologically. Psych:  Alert and cooperative. Normal mood and affect. Heme/Lymph/Immune: No excessive bruising noted.    05/11/2016 11:37 AM   Disclaimer: This note was dictated with voice recognition software. Similar sounding words can inadvertently be transcribed and may not be corrected upon review.

## 2016-05-11 NOTE — Assessment & Plan Note (Signed)
Complaints of constipation. Takes daily stool softeners and does have 1-2 bowel movements today, although is having sensation of incomplete emptying and bloating. Possible retained stool/constipation as etiology. At this point I will have her stop stool softeners and start her on Linzess 75 g once a day on an empty stomach. She is instructed to call us in 2 weeks with a progress report. Return for follow-up in 3 months.

## 2016-05-11 NOTE — Progress Notes (Signed)
cc'ed to pcp °

## 2016-05-11 NOTE — Patient Instructions (Signed)
1. Keep taking Protonix twice daily as you have been. 2. Stop taking stool softeners. 3. I will give you samples of Linzess. Take 1 a day, on an empty stomach. 4. We will schedule your procedure for you. 5. Return for follow-up in 3 months.

## 2016-05-11 NOTE — Assessment & Plan Note (Signed)
Long-standing history of GERD previously well-controlled on once daily Protonix. Within the past month or so patient began having worsening breakthrough symptoms. PCP increased Protonix to twice a day which is helped some but is not resolved her symptoms. At this point we will plan for upper endoscopy to further evaluate for etiology. She does take NSAIDs about twice a month for arthritis type pain, however has taken more recently in the past couple few weeks due to worsening arthritis pain. Return for follow-up in 3 months.  Proceed with EGD with Dr. Gala Romney in near future: the risks, benefits, and alternatives have been discussed with the patient in detail. The patient states understanding and desires to proceed.  The patient is not on any anticoagulants, anxiolytics, antidepressants. She does have hydrocodone and her med list but states she doesn't keep on hand is only very rarely that she takes it. Last procedure was completed on conscious sedation without complications and this should likely be adequate for this procedure as well.

## 2016-05-13 ENCOUNTER — Ambulatory Visit (HOSPITAL_COMMUNITY)
Admission: RE | Admit: 2016-05-13 | Discharge: 2016-05-13 | Disposition: A | Discharge status: Home / Self Care | Payer: Medicare Other | Source: Ambulatory Visit | Attending: Internal Medicine | Admitting: Internal Medicine

## 2016-05-13 ENCOUNTER — Encounter (HOSPITAL_COMMUNITY): Payer: Self-pay

## 2016-05-13 ENCOUNTER — Encounter (HOSPITAL_COMMUNITY)
Admission: RE | Disposition: A | Discharge status: Home / Self Care | Payer: Self-pay | Source: Ambulatory Visit | Attending: Internal Medicine

## 2016-05-13 DIAGNOSIS — K5900 Constipation, unspecified: Secondary | ICD-10-CM | POA: Diagnosis not present

## 2016-05-13 DIAGNOSIS — K259 Gastric ulcer, unspecified as acute or chronic, without hemorrhage or perforation: Secondary | ICD-10-CM | POA: Insufficient documentation

## 2016-05-13 DIAGNOSIS — R140 Abdominal distension (gaseous): Secondary | ICD-10-CM | POA: Insufficient documentation

## 2016-05-13 DIAGNOSIS — Z7951 Long term (current) use of inhaled steroids: Secondary | ICD-10-CM | POA: Diagnosis not present

## 2016-05-13 DIAGNOSIS — K449 Diaphragmatic hernia without obstruction or gangrene: Secondary | ICD-10-CM | POA: Insufficient documentation

## 2016-05-13 DIAGNOSIS — Z87891 Personal history of nicotine dependence: Secondary | ICD-10-CM | POA: Diagnosis not present

## 2016-05-13 DIAGNOSIS — Z886 Allergy status to analgesic agent status: Secondary | ICD-10-CM | POA: Diagnosis not present

## 2016-05-13 DIAGNOSIS — K319 Disease of stomach and duodenum, unspecified: Secondary | ICD-10-CM | POA: Insufficient documentation

## 2016-05-13 DIAGNOSIS — Z79899 Other long term (current) drug therapy: Secondary | ICD-10-CM | POA: Diagnosis not present

## 2016-05-13 DIAGNOSIS — M1990 Unspecified osteoarthritis, unspecified site: Secondary | ICD-10-CM | POA: Insufficient documentation

## 2016-05-13 DIAGNOSIS — K219 Gastro-esophageal reflux disease without esophagitis: Secondary | ICD-10-CM

## 2016-05-13 DIAGNOSIS — G4730 Sleep apnea, unspecified: Secondary | ICD-10-CM | POA: Insufficient documentation

## 2016-05-13 DIAGNOSIS — Z888 Allergy status to other drugs, medicaments and biological substances status: Secondary | ICD-10-CM | POA: Diagnosis not present

## 2016-05-13 HISTORY — PX: ESOPHAGOGASTRODUODENOSCOPY: SHX5428

## 2016-05-13 SURGERY — EGD (ESOPHAGOGASTRODUODENOSCOPY)
Anesthesia: Moderate Sedation

## 2016-05-13 MED ORDER — MIDAZOLAM HCL 5 MG/5ML IJ SOLN
INTRAMUSCULAR | Status: DC | PRN
  Administered 2016-05-13: 2 mg via INTRAVENOUS
  Administered 2016-05-13 (×2): 1 mg via INTRAVENOUS

## 2016-05-13 MED ORDER — MEPERIDINE HCL 100 MG/ML IJ SOLN
INTRAMUSCULAR | Status: AC
  Filled 2016-05-13: qty 2

## 2016-05-13 MED ORDER — SODIUM CHLORIDE 0.9 % IV SOLN
INTRAVENOUS | Status: DC
  Administered 2016-05-13: 14:00:00 via INTRAVENOUS

## 2016-05-13 MED ORDER — STERILE WATER FOR IRRIGATION IR SOLN
Status: DC | PRN
  Administered 2016-05-13: 14:00:00

## 2016-05-13 MED ORDER — MIDAZOLAM HCL 5 MG/5ML IJ SOLN
INTRAMUSCULAR | Status: AC
  Filled 2016-05-13: qty 10

## 2016-05-13 MED ORDER — LIDOCAINE VISCOUS 2 % MT SOLN
OROMUCOSAL | Status: DC | PRN
  Administered 2016-05-13: 3 mL via OROMUCOSAL

## 2016-05-13 MED ORDER — ONDANSETRON HCL 4 MG/2ML IJ SOLN
INTRAMUSCULAR | Status: DC | PRN
Start: 2016-05-13 — End: 2016-05-13
  Administered 2016-05-13: 4 mg via INTRAVENOUS

## 2016-05-13 MED ORDER — MEPERIDINE HCL 100 MG/ML IJ SOLN
INTRAMUSCULAR | Status: DC | PRN
  Administered 2016-05-13: 25 mg via INTRAVENOUS
  Administered 2016-05-13: 50 mg via INTRAVENOUS

## 2016-05-13 MED ORDER — ONDANSETRON HCL 4 MG/2ML IJ SOLN
INTRAMUSCULAR | Status: AC
  Filled 2016-05-13: qty 2

## 2016-05-13 MED ORDER — LIDOCAINE VISCOUS 2 % MT SOLN
OROMUCOSAL | Status: AC
  Filled 2016-05-13: qty 15

## 2016-05-13 NOTE — Op Note (Signed)
[facility] Patient Name: Cheryl [name] Procedure Date: 05/13/2016 1:47 PM MRN: [id number] Date of Birth: 08/28/1946 Attending MD: Norvel Richards , MD CSN: [id number] Age: 70 Admit Type: Outpatient Procedure:                Upper GI endoscopy with gastric ulcer biopsy Indications:              Suspected esophageal reflux Providers:                Norvel Richards, MD, Gwenlyn Fudge, RN, Isabella Stalling, Technician Referring MD:              Medicines:                Midazolam 4 mg IV, Meperidine 75 mg IV, Ondansetron                            4 mg IV Complications:            No immediate complications. Estimated Blood Loss:     Estimated blood loss was minimal. Procedure:                Pre-Anesthesia Assessment:                           - Prior to the procedure, a History and Physical                            was performed, and patient medications and                            allergies were reviewed. The patient's tolerance of                            previous anesthesia was also reviewed. The risks                            and benefits of the procedure and the sedation                            options and risks were discussed with the patient.                            All questions were answered, and informed consent                            was obtained. Prior Anticoagulants: The patient has                            taken no previous anticoagulant or antiplatelet                            agents. ASA Grade Assessment: II - A patient with  mild systemic disease. After reviewing the risks                            and benefits, the patient was deemed in                            satisfactory condition to undergo the procedure.                           After obtaining informed consent, the endoscope was                            passed under direct vision. Throughout the                             procedure, the patient's blood pressure, pulse, and                            oxygen saturations were monitored continuously. The                            [id number] scope was introduced through the                            mouth, and advanced to the second part of duodenum.                            The patient tolerated the procedure well. The upper                            GI endoscopy was accomplished without difficulty.                            The patient tolerated the procedure well. Scope In: 2:10:21 PM Scope Out: 2:20:31 PM Total Procedure Duration: 0 hours 10 minutes 10 seconds  Findings:      Esophagus normal. Multiple 1-2 mm antral erosions present.      Multiple 3 mm erosions were found in the antrum. Appeared to be benign.       Small hiatal hernia. Otherwise, gastric mucosa appermal. Patent pylorus.       Normal-appearing first and second portion of the duodenum. Biopsies of       the gastric ulcer taken. Impression:               gastric ulcer and erosions?status post biopsy                           - Erosive gastropathy.                           - No specimens collected. Moderate Sedation:      Moderate (conscious) sedation was administered by the endoscopy nurse       and supervised by the endoscopist. The following parameters were       monitored: oxygen saturation, heart rate, blood pressure, respiratory  rate, EKG, adequacy of pulmonary ventilation, and response to care.       Total physician intraservice time was 15 minutes.      Moderate (conscious) sedation was administered by the endoscopy nurse       and supervised by the endoscopist. The following parameters were       monitored: oxygen saturation, heart rate, blood pressure, respiratory       rate, EKG, adequacy of pulmonary ventilation, and response to care.       Total physician intraservice time was 17 minutes. Recommendation:           - Patient has a contact number available for                             emergencies. The signs and symptoms of potential                            delayed complications were discussed with the                            patient. Return to normal activities tomorrow.                            Written discharge instructions were provided to the                            patient.                           - Advance diet as tolerated today. Stop Aleve and                            all nonsteroidal agents. Increase Protonix to 40 mg                            twice daily.                           - - Await pathology results.                           - Repeat upper endoscopy in 3 months for                            surveillance. Procedure Code(s):        --- Professional ---                           [phone], Esophagogastroduodenoscopy, flexible,                            transoral; diagnostic, including collection of                            specimen(s) by brushing or washing, when performed                            (  separate procedure)                           [id number], Moderate sedation services provided by the                            same physician or other qualified health care                            professional performing the diagnostic or                            therapeutic service that the sedation supports,                            requiring the presence of an independent trained                            observer to assist in the monitoring of the                            patient's level of consciousness and physiological                            status; initial 15 minutes of intraservice time,                            patient age 72 years or older                           [phone], Moderate sedation services provided by the                            same physician or other qualified health care                            professional performing the diagnostic or                            therapeutic  service that the sedation supports,                            requiring the presence of an independent trained                            observer to assist in the monitoring of the                            patient's level of consciousness and physiological                            status; initial 15 minutes of intraservice time,                            patient  age 52 years or older Diagnosis Code(s):        --- Professional ---                           K31.89, Other diseases of stomach and duodenum CPT copyright 2016 American Medical Association. All rights reserved. The codes documented in this report are preliminary and upon coder review may  be revised to meet current compliance requirements. Cristopher Estimable. Jeneal Vogl, MD Norvel Richards, MD 05/13/2016 2:37:07 PM This report has been signed electronically. Number of Addenda: 0

## 2016-05-13 NOTE — Discharge Instructions (Signed)
EGD Discharge instructions Please read the instructions outlined below and refer to this sheet in the next few weeks. These discharge instructions provide you with general information on caring for yourself after you leave the hospital. Your doctor may also give you specific instructions. While your treatment has been planned according to the most current medical practices available, unavoidable complications occasionally occur. If you have any problems or questions after discharge, please call your doctor. ACTIVITY  You may resume your regular activity but move at a slower pace for the next 24 hours.   Take frequent rest periods for the next 24 hours.   Walking will help expel (get rid of) the air and reduce the bloated feeling in your abdomen.   No driving for 24 hours (because of the anesthesia (medicine) used during the test).   You may shower.   Do not sign any important legal documents or operate any machinery for 24 hours (because of the anesthesia used during the test).  NUTRITION  Drink plenty of fluids.   You may resume your normal diet.   Begin with a light meal and progress to your normal diet.   Avoid alcoholic beverages for 24 hours or as instructed by your caregiver.  MEDICATIONS  You may resume your normal medications unless your caregiver tells you otherwise.  WHAT YOU CAN EXPECT TODAY  You may experience abdominal discomfort such as a feeling of fullness or gas pains.  FOLLOW-UP  Your doctor will discuss the results of your test with you.  SEEK IMMEDIATE MEDICAL ATTENTION IF ANY OF THE FOLLOWING OCCUR:  Excessive nausea (feeling sick to your stomach) and/or vomiting.   Severe abdominal pain and distention (swelling).   Trouble swallowing.   Temperature over 101 F (37.8 C).   Rectal bleeding or vomiting of blood.   Stop taking Aleve-it is damaging your stomach  GERD information provided  Increase Protonix to 40 mg twice daily for the time  being  Further recommendations to follow after pathology report is available for review  Gastroesophageal Reflux Disease, Adult Normally, food travels down the esophagus and stays in the stomach to be digested. However, when a person has gastroesophageal reflux disease (GERD), food and stomach acid move back up into the esophagus. When this happens, the esophagus becomes sore and inflamed. Over time, GERD can create small holes (ulcers) in the lining of the esophagus.  CAUSES This condition is caused by a problem with the muscle between the esophagus and the stomach (lower esophageal sphincter, or LES). Normally, the LES muscle closes after food passes through the esophagus to the stomach. When the LES is weakened or abnormal, it does not close properly, and that allows food and stomach acid to go back up into the esophagus. The LES can be weakened by certain dietary substances, medicines, and medical conditions, including:  Tobacco use.  Pregnancy.  Having a hiatal hernia.  Heavy alcohol use.  Certain foods and beverages, such as coffee, chocolate, onions, and peppermint. RISK FACTORS This condition is more likely to develop in:  People who have an increased body weight.  People who have connective tissue disorders.  People who use NSAID medicines. SYMPTOMS Symptoms of this condition include:  Heartburn.  Difficult or painful swallowing.  The feeling of having a lump in the throat.  Abitter taste in the mouth.  Bad breath.  Having a large amount of saliva.  Having an upset or bloated stomach.  Belching.  Chest pain.  Shortness of breath or wheezing.  Ongoing (chronic) cough or a night-time cough.  Wearing away of tooth enamel.  Weight loss. Different conditions can cause chest pain. Make sure to see your health care provider if you experience chest pain. DIAGNOSIS Your health care provider will take a medical history and perform a physical exam. To determine  if you have mild or severe GERD, your health care provider may also monitor how you respond to treatment. You may also have other tests, including:  An endoscopy toexamine your stomach and esophagus with a small camera.  A test thatmeasures the acidity level in your esophagus.  A test thatmeasures how much pressure is on your esophagus.  A barium swallow or modified barium swallow to show the shape, size, and functioning of your esophagus. TREATMENT The goal of treatment is to help relieve your symptoms and to prevent complications. Treatment for this condition may vary depending on how severe your symptoms are. Your health care provider may recommend:  Changes to your diet.  Medicine.  Surgery. HOME CARE INSTRUCTIONS Diet  Follow a diet as recommended by your health care provider. This may involve avoiding foods and drinks such as:  Coffee and tea (with or without caffeine).  Drinks that containalcohol.  Energy drinks and sports drinks.  Carbonated drinks or sodas.  Chocolate and cocoa.  Peppermint and mint flavorings.  Garlic and onions.  Horseradish.  Spicy and acidic foods, including peppers, chili powder, curry powder, vinegar, hot sauces, and barbecue sauce.  Citrus fruit juices and citrus fruits, such as oranges, lemons, and limes.  Tomato-based foods, such as red sauce, chili, salsa, and pizza with red sauce.  Fried and fatty foods, such as donuts, french fries, potato chips, and high-fat dressings.  High-fat meats, such as hot dogs and fatty cuts of red and white meats, such as rib eye steak, sausage, ham, and bacon.  High-fat dairy items, such as whole milk, butter, and cream cheese.  Eat small, frequent meals instead of large meals.  Avoid drinking large amounts of liquid with your meals.  Avoid eating meals during the 2-3 hours before bedtime.  Avoid lying down right after you eat.  Do not exercise right after you eat. General  Instructions  Pay attention to any changes in your symptoms.  Take over-the-counter and prescription medicines only as told by your health care provider. Do not take aspirin, ibuprofen, or other NSAIDs unless your health care provider told you to do so.  Do not use any tobacco products, including cigarettes, chewing tobacco, and e-cigarettes. If you need help quitting, ask your health care provider.  Wear loose-fitting clothing. Do not wear anything tight around your waist that causes pressure on your abdomen.  Raise (elevate) the head of your bed 6 inches (15cm).  Try to reduce your stress, such as with yoga or meditation. If you need help reducing stress, ask your health care provider.  If you are overweight, reduce your weight to an amount that is healthy for you. Ask your health care provider for guidance about a safe weight loss goal.  Keep all follow-up visits as told by your health care provider. This is important. SEEK MEDICAL CARE IF:  You have new symptoms.  You have unexplained weight loss.  You have difficulty swallowing, or it hurts to swallow.  You have wheezing or a persistent cough.  Your symptoms do not improve with treatment.  You have a hoarse voice. SEEK IMMEDIATE MEDICAL CARE IF:  You have pain in your arms, neck, jaw, teeth,  or back.  You feel sweaty, dizzy, or light-headed.  You have chest pain or shortness of breath.  You vomit and your vomit looks like blood or coffee grounds.  You faint.  Your stool is bloody or black.  You cannot swallow, drink, or eat.   This information is not intended to replace advice given to you by your health care provider. Make sure you discuss any questions you have with your health care provider.   Document Released: 09/15/2005 Document Revised: 08/27/2015 Document Reviewed: 04/02/2015 Elsevier Interactive Patient Education [employer].

## 2016-05-13 NOTE — Interval H&P Note (Signed)
History and Physical Interval Note:  05/13/2016 1:59 PM  Cheryl [name]  has presented today for surgery, with the diagnosis of GERD  The various methods of treatment have been discussed with the patient and family. After consideration of risks, benefits and other options for treatment, the patient has consented to  Procedure(s) with comments: ESOPHAGOGASTRODUODENOSCOPY (EGD) (N/A) - 145 as a surgical intervention .  The patient's history has been reviewed, patient examined, no change in status, stable for surgery.  I have reviewed the patient's chart and labs.  Questions were answered to the patient's satisfaction.     Amit Meloy  No change. Patient denies dysphagia. Diagnostic EGD per plan. The risks, benefits, limitations, alternatives and imponderables have been reviewed with the patient. Potential for esophageal dilation, biopsy, etc. have also been reviewed.  Questions have been answered. All parties agreeable.

## 2016-05-13 NOTE — H&P (View-Only) (Signed)
Referring Provider: Sharilyn Sites, MD Primary Care Physician:  Purvis Kilts, MD Primary GI:  Dr. Gala Romney  Chief Complaint  Patient presents with  . set up EGD  . Gastroesophageal Reflux  . Constipation    HPI:   Cheryl [name] is a 70 y.o. female who presents on referral from primary care for evaluation of persistent GERD. Patient last seen by primary care on 04/05/2016 for annual wellness visit and for persistent reflux. Patient described burning epigastric pain with sour taste in the posterior pharynx already on Protonix. They increased her Protonix to twice daily and referred back to GI for consideration of upper endoscopy. Colonoscopy is currently up-to-date. Last endoscopy completed 2014 which found hiatal hernia, no endoscopic explanation for right upper quadrant abdominal pain. At that time recommended stop Nexium, trial Protonix 40 mg daily. Also with a history of fatty liver, last seen for this by our office on 07/09/2015 with noted normal LFTs, recommendation for 1-2 pound weight loss until ideal body weight, low fat/cholesterol diet, avoid sugary substances, limit alcohol intake.  Today she states she began having worsening GERD symptoms, bloating, constipation. GERD started getting worse a couple weeks ago. Was having symptoms about once or twice a week on Protonix daily. Protonix bid is helping some, but still having breakthrough about once a week. Some flares are very significant. Symptoms include epigastric burning, bitter taste, has to lay down. Also with constipation and bloating, will only have a bowel movement every 2-3 days. Currently taking daily stool softeners with a good bowel movement in the morning and maybe 1-2 other small ones later in the day. Has sensation of incomplete emptying and persistent bloating. Denies hematochezia, melena, N/V, fever, chills, unintentional weight loss. Denies chest pain, dyspnea, dizziness, lightheadedness, syncope, near syncope. Denies  any other upper or lower GI symptoms.  Rare use of Norco, "doesn't even keep on hand." Takes occasional NSAIDs (Ibuprofen or Aleve) about twice a month, a little more these past couple weeks due to arthritis pain.  Past Medical History  Diagnosis Date  . Acid reflux   . Sleep apnea     had surgery to correct  . Sinus drainage   . Arthritis     Past Surgical History  Procedure Laterality Date  . Knee surgery      x2  . Rotator cuff repair      x2  . Trigger finger release    . Carpal tunnel release    . Colonoscopy  02/15/2005    [id number] small polyps ablated via cold biopsy, one from transverse colon and two from the rectum/Small external hemorrhoids  . Colonoscopy  07/10/2010    [id number] rectum/long redundant colon, polyps in the sigmoid, descending, hepatic flexure/ADENOMATOUS POLYPS. next TCS due 06/2015  . Esophagogastroduodenoscopy  1995    Gastritis  . Colonoscopy  1995    3 polyps, path revealed chronic colitis  . Esophagogastroduodenoscopy N/A 04/16/2013    RMR: HH  . Uvulopalatopharyngoplasty    . Shoulder arthroscopy with rotator cuff repair and subacromial decompression Right 01/21/2015    Procedure: RIGHT SHOULDER ARTHROSCOPIC DEBRIDEMNT OF G-H JOINT AND REMOVAL OF LOOSE BODIES,ARTHROSCOPIC SUBACROMIAL DECOMPRESSION,MINI OPEN RCT REPAIR WITH SUPPLEMENTAL [facility] PATCH;  Surgeon: Garald Balding, MD;  Location: [place];  Service: Orthopedics;  Laterality: Right;  . Colonoscopy N/A 08/05/2015    Procedure: COLONOSCOPY;  Surgeon: Daneil Dolin, MD;  [facility];  Service: Endoscopy;  Laterality: N/A;  1000    Current  Outpatient Prescriptions  Medication Sig Dispense Refill  . albuterol (PROVENTIL HFA;VENTOLIN HFA) 108 (90 BASE) MCG/ACT inhaler Inhale 1 puff into the lungs every 6 (six) hours as needed for wheezing or shortness of breath.    . allopurinol (ZYLOPRIM) 100 MG tablet Take 100 mg by mouth 3 (three) times daily with meals.    . Biotin 1000 MCG  tablet Take 1,000 mcg by mouth every morning.     Marland Kitchen CALCIUM-VITAMIN D PO Take 1 tablet by mouth every morning.    . Cyanocobalamin (VITAMIN B-12 PO) Take 1 tablet by mouth every morning.     . cyclobenzaprine (FLEXERIL) 5 MG tablet Take 1 tablet (5 mg total) by mouth 2 (two) times daily as needed for muscle spasms. 8 tablet 0  . febuxostat (ULORIC) 40 MG tablet Take 40 mg by mouth daily.    . fluticasone (FLONASE) 50 MCG/ACT nasal spray Place 1 spray into both nostrils daily as needed for allergies (sinuses).    Marland Kitchen HYDROcodone-acetaminophen (NORCO/VICODIN) 5-325 MG tablet Take 1 tablet by mouth every 6 (six) hours as needed. 6 tablet 0  . montelukast (SINGULAIR) 10 MG tablet Take 10 mg by mouth every morning.    . naproxen sodium (ALEVE) 220 MG tablet Take 440 mg by mouth 2 (two) times daily as needed (pain).     . pantoprazole (PROTONIX) 40 MG tablet Take 40 mg by mouth daily.    . raloxifene (EVISTA) 60 MG tablet Take 60 mg by mouth daily.    Marland Kitchen tolterodine (DETROL) 1 MG tablet Take 1 mg by mouth 2 (two) times daily.     No current facility-administered medications for this visit.    Allergies as of 05/11/2016 - Review Complete 05/11/2016  Allergen Reaction Noted  . Prednisone Other (See Comments) 04/04/2013  . Tramadol Nausea Only 01/21/2015  . Aspirin Nausea And Vomiting   . Celecoxib Rash     Family History  Problem Relation Age of Onset  . Cancer Mother   . Cancer Father   . Diabetes Sister   . Colon cancer Neg Hx   . Liver disease Neg Hx     Social History   Social History  . Marital Status: Married    Spouse Name: N/A  . Number of Children: 2  . Years of Education: N/A   Occupational History  . BUS DRIVER    Social History Main Topics  . Smoking status: Former Smoker -- 48 years    Types: Cigarettes    Quit date: 05/11/2005  . Smokeless tobacco: None  . Alcohol Use: No  . Drug Use: No  . Sexual Activity: Not Asked   Other Topics Concern  . None   Social  History Narrative    Review of Systems: General: Negative for anorexia, weight loss, fever, chills, fatigue, weakness. ENT: Negative for hoarseness, difficulty swallowing. CV: Negative for chest pain, angina, palpitations, peripheral edema.  Respiratory: Negative for dyspnea at rest, cough, sputum, wheezing.  GI: See history of present illness. MS: Admits chronic shoulder pain from arthritis.  Endo: Negative for unusual weight change.  Heme: Negative for bruising or bleeding.   Physical Exam: BP 154/75 mmHg  Pulse 86  Temp(Src) 97.6 F (36.4 C)  Ht 4\' 11"  (1.499 m)  Wt 251 lb 6.4 oz (114.034 kg)  BMI 50.75 kg/m2 General:   Alert and oriented. Pleasant and cooperative. Well-nourished and well-developed.  Head:  Normocephalic and atraumatic. Eyes:  Without icterus, sclera clear and conjunctiva pink.  Ears:  Normal auditory acuity. Cardiovascular:  S1, S2 present without murmurs appreciated. Normal biolateral DP pulses noted. Extremities without clubbing or edema. Respiratory:  Clear to auscultation bilaterally. No wheezes, rales, or rhonchi. No distress.  Gastrointestinal:  +BS, soft, and non-distended. Noted mild LLQ and LUQ abdominal TTP.No HSM noted. No guarding or rebound. No masses appreciated.  Rectal:  Deferred  Musculoskalatal:  Symmetrical without gross deformities. Neurologic:  Alert and oriented x4;  grossly normal neurologically. Psych:  Alert and cooperative. Normal mood and affect. Heme/Lymph/Immune: No excessive bruising noted.    05/11/2016 11:37 AM   Disclaimer: This note was dictated with voice recognition software. Similar sounding words can inadvertently be transcribed and may not be corrected upon review.

## 2016-05-20 ENCOUNTER — Encounter (HOSPITAL_COMMUNITY): Payer: Self-pay | Admitting: Internal Medicine

## 2016-05-21 ENCOUNTER — Ambulatory Visit: Payer: Medicare Other | Attending: Family Medicine

## 2016-05-21 DIAGNOSIS — M25511 Pain in right shoulder: Secondary | ICD-10-CM | POA: Diagnosis not present

## 2016-05-21 DIAGNOSIS — M6281 Muscle weakness (generalized): Secondary | ICD-10-CM | POA: Insufficient documentation

## 2016-05-21 DIAGNOSIS — R252 Cramp and spasm: Secondary | ICD-10-CM | POA: Insufficient documentation

## 2016-05-21 DIAGNOSIS — M542 Cervicalgia: Secondary | ICD-10-CM

## 2016-05-21 DIAGNOSIS — R293 Abnormal posture: Secondary | ICD-10-CM

## 2016-05-21 DIAGNOSIS — Z9889 Other specified postprocedural states: Secondary | ICD-10-CM | POA: Diagnosis not present

## 2016-05-21 NOTE — Therapy (Signed)
[place], [place], [address] Phone: [phone]   Fax:  [phone]  Physical Therapy Evaluation  Patient Details  Name: TATASHA MI MRN: [id number] Date of Birth: 1946/05/19 Referring Provider: Joni Fears, MD  Encounter Date: 05/21/2016      PT End of Session - 05/21/16 1216    Visit Number 1   Number of Visits 12   Date for PT Re-Evaluation 07/02/16   Authorization Type Medicare    Authorization Time Period KX at visit 15   PT Start Time 1100   PT Stop Time 1200   PT Time Calculation (min) 60 min   Activity Tolerance Patient tolerated treatment well   Behavior During Therapy [facility] for tasks assessed/performed      Past Medical History  Diagnosis Date  . Acid reflux   . Sleep apnea     had surgery to correct  . Sinus drainage   . Arthritis     Past Surgical History  Procedure Laterality Date  . Knee surgery      x2  . Rotator cuff repair      x2  . Trigger finger release    . Carpal tunnel release    . Colonoscopy  02/15/2005    [id number] small polyps ablated via cold biopsy, one from transverse colon and two from the rectum/Small external hemorrhoids  . Colonoscopy  07/10/2010    [id number] rectum/long redundant colon, polyps in the sigmoid, descending, hepatic flexure/ADENOMATOUS POLYPS. next TCS due 06/2015  . Esophagogastroduodenoscopy  1995    Gastritis  . Colonoscopy  1995    3 polyps, path revealed chronic colitis  . Esophagogastroduodenoscopy N/A 04/16/2013    RMR: HH  . Uvulopalatopharyngoplasty    . Shoulder arthroscopy with rotator cuff repair and subacromial decompression Right 01/21/2015    Procedure: RIGHT SHOULDER ARTHROSCOPIC DEBRIDEMNT OF G-H JOINT AND REMOVAL OF LOOSE BODIES,ARTHROSCOPIC SUBACROMIAL DECOMPRESSION,MINI OPEN RCT REPAIR WITH SUPPLEMENTAL [facility] PATCH;  Surgeon: Garald Balding, MD;  Location: [place];  Service: Orthopedics;  Laterality: Right;  .  Colonoscopy N/A 08/05/2015    Procedure: COLONOSCOPY;  Surgeon: Daneil Dolin, MD;  [facility];  Service: Endoscopy;  Laterality: N/A;  1000  . Esophagogastroduodenoscopy N/A 05/13/2016    Procedure: ESOPHAGOGASTRODUODENOSCOPY (EGD);  Surgeon: Daneil Dolin, MD;  [facility];  Service: Endoscopy;  Laterality: N/A;  145    There were no vitals filed for this visit.       Subjective Assessment - 05/21/16 1108    Subjective She reports OA of neck and pain into RT arm.  Pain started with surgery 01/22/16. She rports pain as constant starting 4 weeks ago in neck and into RT shoulder limting use of RT arm    Limitations Lifting  nw uses arm more as a helper using LT arm more   How long can you sit comfortably? NA   How long can you stand comfortably? Limited by back probelms   How long can you walk comfortably? Limited by back problems   Diagnostic tests Xrays: OA DDD   Patient Stated Goals To releive pain and get use of arm and get use of arm back   Currently in Pain? Yes   Pain Score 7    Pain Location Neck   Pain Orientation Right   Pain Descriptors / Indicators Stabbing   Pain Type Acute pain;Chronic pain   Pain Radiating Towards To RT shoulder and arm to elbow  Pain Onset More than a month ago   Pain Frequency Constant   Aggravating Factors  Usiing arm   Pain Relieving Factors Ice and tylemol   Multiple Pain Sites No  Back issues not addressed            [facility] PT Assessment - 05/21/16 0001    Assessment   Medical Diagnosis Cervical pain DDD   Referring Provider Joni Fears, MD   Onset Date/Surgical Date --  4 weeks ago neck pain became constant   Hand Dominance Right   Next MD Visit As needed   Prior Therapy For RT shoulder  last year   Precautions   Precautions None   Restrictions   Weight Bearing Restrictions No   Balance Screen   Has the patient fallen in the past 6 months No   Has the patient had a decrease in activity level because  of a fear of falling?  No   Prior Function   Level of Independence --  limits getting on chair/ ladder to hang curtains   Cognition   Overall Cognitive Status Within Functional Limits for tasks assessed   Posture/Postural Control   Posture Comments rounded shoulders forward head.    ROM / Strength   AROM / PROM / Strength AROM;Strength   AROM   AROM Assessment Site Cervical;Shoulder   Right/Left Shoulder Right   Right Shoulder Flexion 110 Degrees   Right Shoulder ABduction 115 Degrees   Right Shoulder Internal Rotation 10 Degrees   Right Shoulder External Rotation 35 Degrees   Cervical Flexion 30   Cervical Extension 45   Cervical - Right Side Bend 65   Cervical - Left Side Bend 65   Cervical - Right Rotation 30   Cervical - Left Rotation 30   Strength   Overall Strength Comments WNL both shoulders    Palpation   Palpation comment Tendr RT paraspinals    Ambulation/Gait   Gait Comments No device                   [facility] Adult PT Treatment/Exercise - 05/21/16 0001    Neuro Re-ed    Neuro Re-ed Details  Posture education with tactile  verbal and use of model for cues. and awareness of not extendiing neck with ROM   Modalities   Modalities Moist Heat;Electrical Stimulation   Moist Heat Therapy   Number Minutes Moist Heat 15 Minutes   Moist Heat Location Cervical                PT Education - 05/21/16 1215    Education provided Yes   Education Details POC, degenerative changes in neck and how this can cause pain with movement [place], USe of support and heat   Person(s) Educated Patient   Methods Explanation;Demonstration;Tactile cues;Verbal cues   Comprehension Returned demonstration;Verbalized understanding          PT Short Term Goals - 05/21/16 1220    PT SHORT TERM GOAL #1   Title "Independent with initial HEP   Time 3   Period Weeks   Status New   PT SHORT TERM GOAL #2   Title "Report pain decrease from  7 /10 to   4 /10.   Time 3    Period Weeks   Status New   PT SHORT TERM GOAL #3   Title "Demonstrate and verbalize understanding of condition management including heat, positioning/posture, HEP.    Time 3   Period Weeks   Status New  PT Long Term Goals - 05-26-16 1221    PT LONG TERM GOAL #1   Title "Pt will be independent with advanced HEP.    Time 6   Period Weeks   Status New   PT LONG TERM GOAL #2   Title "Pain will decrease to 1/10 with all functional activities   Time 6   Period Weeks   Status New   PT LONG TERM GOAL #4   Title She will be able to extend neck without shooting pain into RT shoulder or arm   Time 6   Period Weeks   Status New   PT LONG TERM GOAL #5   Title "FOTO will improve from  45%  to  30%   indicating improved functional mobility .    Time 9   Period Weeks   Status New               Plan - May 26, 2016 1217    Clinical Impression Statement Ms  Hogge came to clinic today for progressing neck pain for moderate complexity eval demonstrating decreased neck and RT shoulder motion and abnormal posture , muscle spasm, ,    Rehab Potential Good   PT Frequency 2x / week   PT Duration 6 weeks   PT Treatment/Interventions Cryotherapy;Electrical Stimulation;Iontophoresis 4mg /ml Dexamethasone;Moist Heat;Traction;Ultrasound;Therapeutic exercise;Patient/family education;Manual techniques;Passive range of motion;Taping;Dry needling   PT Next Visit Plan REview posture , modalities and manual  to RT neck /shoulder   PT Home Exercise Plan posture awareness   Consulted and Agree with Plan of Care Patient      Patient will benefit from skilled therapeutic intervention in order to improve the following deficits and impairments:  Pain, Impaired UE functional use, Decreased activity tolerance, Decreased range of motion, Increased muscle spasms, Postural dysfunction  Visit Diagnosis: Cervicalgia  Abnormal posture  Cramp and spasm      G-Codes - 2016/05/26 1225    Functional  Assessment Tool Used FOTO 47%   Functional Limitation Changing and maintaining body position   Changing and Maintaining Body Position Current Status [id number]) At least 40 percent but less than 60 percent impaired, limited or restricted   Changing and Maintaining Body Position Goal Status [id number]) At least 20 percent but less than 40 percent impaired, limited or restricted       Problem List Patient Active Problem List   Diagnosis Date Noted  . Gastric ulceration   . GERD (gastroesophageal reflux disease) 05/11/2016  . Constipation 05/11/2016  . Hashimoto's thyroiditis 02/20/2016  . History of colonic polyps   . Diverticulosis of colon without hemorrhage   . Complete tear of right rotator cuff 01/21/2015  . Severe obesity (BMI >= 40) ([place]) 01/21/2015  . Nausea alone 04/04/2013  . H/O adenomatous polyp of colon 04/04/2013  . Fatty liver 11/11/2010  . RUQ PAIN 11/11/2010  . NONSPEC ELEVATION OF LEVELS OF TRANSAMINASE/LDH 11/11/2010  . COLONIC POLYPS, HYPERPLASTIC, HX OF 06/25/2010  . ANEMIA-NOS 06/24/2010    Darrel Hoover  PT 2016/05/26, 12:27 PM  [facility] [address] [place], [place], [address] Phone: [phone]   Fax:  [phone]  Name: SHAKIRIA PERL MRN: [id number] Date of Birth: [birth year]

## 2016-05-23 ENCOUNTER — Encounter: Payer: Self-pay | Admitting: Internal Medicine

## 2016-05-24 ENCOUNTER — Telehealth: Payer: Self-pay | Admitting: Internal Medicine

## 2016-05-24 ENCOUNTER — Ambulatory Visit: Payer: Medicare Other | Admitting: Physical Therapy

## 2016-05-24 NOTE — Telephone Encounter (Signed)
Pt saw EG on 5/23 and was put on Linzess. She was to call back and let us know how it was working for her. She would like to speak with the nurse about the medicine to see if she should be experiencing the things she has been experiencing since taking the medicine. Please call her at [phone]

## 2016-05-24 NOTE — Telephone Encounter (Signed)
Spoke with the pt. She said when she first started taking the linzess 72mg , she was having a bm 2-3 times a day, now she is only going once a day and yesterday she didn't have a bm at all. She wanted to know if we needed to increase her dosage? And if so, could we give her samples?

## 2016-05-25 NOTE — Telephone Encounter (Signed)
Samples are at the front desk. Pt is aware.

## 2016-05-25 NOTE — Telephone Encounter (Signed)
Tell her thanks for calling. We can trial 145 mcg, provide samples for 2 weeks. Ask her to call back when she's been taking the higher dose at least a week and let us know how it's doing.

## 2016-06-01 DIAGNOSIS — Z01419 Encounter for gynecological examination (general) (routine) without abnormal findings: Secondary | ICD-10-CM | POA: Diagnosis not present

## 2016-06-01 DIAGNOSIS — Z124 Encounter for screening for malignant neoplasm of cervix: Secondary | ICD-10-CM | POA: Diagnosis not present

## 2016-06-02 ENCOUNTER — Ambulatory Visit: Payer: Medicare Other | Admitting: Physical Therapy

## 2016-06-02 DIAGNOSIS — M542 Cervicalgia: Secondary | ICD-10-CM

## 2016-06-02 DIAGNOSIS — R252 Cramp and spasm: Secondary | ICD-10-CM

## 2016-06-02 DIAGNOSIS — Z9889 Other specified postprocedural states: Secondary | ICD-10-CM | POA: Diagnosis not present

## 2016-06-02 DIAGNOSIS — M25511 Pain in right shoulder: Secondary | ICD-10-CM

## 2016-06-02 DIAGNOSIS — R293 Abnormal posture: Secondary | ICD-10-CM | POA: Diagnosis not present

## 2016-06-02 DIAGNOSIS — R531 Weakness: Secondary | ICD-10-CM

## 2016-06-02 DIAGNOSIS — M25611 Stiffness of right shoulder, not elsewhere classified: Secondary | ICD-10-CM

## 2016-06-02 DIAGNOSIS — M6281 Muscle weakness (generalized): Secondary | ICD-10-CM | POA: Diagnosis not present

## 2016-06-02 NOTE — Therapy (Signed)
[place], [place], [address] Phone: [phone]   Fax:  [phone]  Physical Therapy Treatment  Patient Details  Name: Cheryl [name] MRN: [id number] Date of Birth: December 12, 1946 Referring Provider: Joni Fears, MD  Encounter Date: 06/02/2016      PT End of Session - 06/02/16 1536    Visit Number 2   Number of Visits 12   Date for PT Re-Evaluation 07/02/16   PT Start Time 9622   PT Stop Time 1534   PT Time Calculation (min) 76 min   Activity Tolerance Patient tolerated treatment well   Behavior During Therapy [facility] for tasks assessed/performed      Past Medical History  Diagnosis Date  . Acid reflux   . Sleep apnea     had surgery to correct  . Sinus drainage   . Arthritis     Past Surgical History  Procedure Laterality Date  . Knee surgery      x2  . Rotator cuff repair      x2  . Trigger finger release    . Carpal tunnel release    . Colonoscopy  02/15/2005    [id number] small polyps ablated via cold biopsy, one from transverse colon and two from the rectum/Small external hemorrhoids  . Colonoscopy  07/10/2010    [id number] rectum/long redundant colon, polyps in the sigmoid, descending, hepatic flexure/ADENOMATOUS POLYPS. next TCS due 06/2015  . Esophagogastroduodenoscopy  1995    Gastritis  . Colonoscopy  1995    3 polyps, path revealed chronic colitis  . Esophagogastroduodenoscopy N/A 04/16/2013    RMR: HH  . Uvulopalatopharyngoplasty    . Shoulder arthroscopy with rotator cuff repair and subacromial decompression Right 01/21/2015    Procedure: RIGHT SHOULDER ARTHROSCOPIC DEBRIDEMNT OF G-H JOINT AND REMOVAL OF LOOSE BODIES,ARTHROSCOPIC SUBACROMIAL DECOMPRESSION,MINI OPEN RCT REPAIR WITH SUPPLEMENTAL [facility] [place] PATCH;  Surgeon: Garald Balding, MD;  Location: [place];  Service: Orthopedics;  Laterality: Right;  . Colonoscopy N/A 08/05/2015    Procedure: COLONOSCOPY;  Surgeon: Daneil Dolin,  MD;  [facility];  Service: Endoscopy;  Laterality: N/A;  1000  . Esophagogastroduodenoscopy N/A 05/13/2016    Procedure: ESOPHAGOGASTRODUODENOSCOPY (EGD);  Surgeon: Daneil Dolin, MD;  [facility];  Service: Endoscopy;  Laterality: N/A;  145    There were no vitals filed for this visit.      Subjective Assessment - 06/02/16 1423    Subjective One of her better days.  Not sure why it is better.  uses right arm as a helper, both arms heve pain.    Currently in Pain? Yes   Pain Score 7    Pain Location Neck   Pain Orientation Right;Left   Pain Descriptors / Indicators Stabbing   Pain Radiating Towards Right shoulder down arm,  Left shoulder to deltoid.     Pain Frequency Constant   Aggravating Factors  Sleeping on right,  Trying to lift    Pain Relieving Factors tylenol,  Ice heat                         [facility] Adult PT Treatment/Exercise - 06/02/16 0001    Self-Care   Self-Care Posture   Posture sleeping, sitting practiced.  handout issued for ADL's   Neck Exercises: Seated   Cervical Rotation --  practiced with focus on chin tuck.    Other Seated Exercise Waist up rotation (YOGA) 3 X RT/LT  cues   Modalities   Modalities Moist Heat   Moist Heat Therapy   Number Minutes Moist Heat 15 Minutes   Moist Heat Location Cervical   Electrical Stimulation   Electrical Stimulation Location upper back, neck/shoulder   Electrical Stimulation Action IFC   Electrical Stimulation Parameters 12   Electrical Stimulation Goals Pain   Manual Therapy   Manual Therapy Soft tissue mobilization   Manual therapy comments Tissue softer,  no arm paon at rest after   Soft tissue mobilization neuromuscular trigger point release to soften neck/upper back/shoulder RTmore than LT                PT Education - 06/02/16 1535    Education provided Yes   Education Details Posture   Person(s) Educated Patient   Methods Explanation;Demonstration;Verbal  cues;Handout   Comprehension Verbalized understanding;Returned demonstration;Need further instruction          PT Short Term Goals - 06/02/16 1539    PT SHORT TERM GOAL #1   Title "Independent with initial HEP   Baseline able to demonstrate 1 exercise correctly   Time 3   Period Weeks   Status On-going   PT SHORT TERM GOAL #2   Title "Report pain decrease from  7 /10 to   4 /10.   Time 3   Period Weeks   Status On-going   PT SHORT TERM GOAL #3   Title "Demonstrate and verbalize understanding of condition management including heat, positioning/posture, HEP.    Baseline education initiated   Time 3   Period Weeks   Status On-going           PT Long Term Goals - 05/21/16 1221    PT LONG TERM GOAL #1   Title "Pt will be independent with advanced HEP.    Time 6   Period Weeks   Status New   PT LONG TERM GOAL #2   Title "Pain will decrease to 1/10 with all functional activities   Time 6   Period Weeks   Status New   PT LONG TERM GOAL #4   Title She will be able to extend neck without shooting pain into RT shoulder or arm   Time 6   Period Weeks   Status New   PT LONG TERM GOAL #5   Title "FOTO will improve from  45%  to  30%   indicating improved functional mobility .    Time 9   Period Weeks   Status New               Plan - 06/02/16 1536    Clinical Impression Statement Able to decrease arm pain with manual.  Patient used good technique with neck rotations with keeping her chin down.  No now goals met.  Pain geratly improved post session.    PT Next Visit Plan  Check to see if she found her neck support for sllepin.  , modalities and manual  to RT neck /shoulder   PT Home Exercise Plan continue   Consulted and Agree with Plan of Care Patient      Patient will benefit from skilled therapeutic intervention in order to improve the following deficits and impairments:  Pain, Impaired UE functional use, Decreased activity tolerance, Decreased range of  motion, Increased muscle spasms, Postural dysfunction  Visit Diagnosis: Cervicalgia  Abnormal posture  Cramp and spasm  Decreased strength  Decreased ROM of right shoulder  Pain in joint, shoulder region, right  Problem List Patient Active Problem List   Diagnosis Date Noted  . Gastric ulceration   . GERD (gastroesophageal reflux disease) 05/11/2016  . Constipation 05/11/2016  . Hashimoto's thyroiditis 02/20/2016  . History of colonic polyps   . Diverticulosis of colon without hemorrhage   . Complete tear of right rotator cuff 01/21/2015  . Severe obesity (BMI >= 40) ([place]) 01/21/2015  . Nausea alone 04/04/2013  . H/O adenomatous polyp of colon 04/04/2013  . Fatty liver 11/11/2010  . RUQ PAIN 11/11/2010  . NONSPEC ELEVATION OF LEVELS OF TRANSAMINASE/LDH 11/11/2010  . COLONIC POLYPS, HYPERPLASTIC, HX OF 06/25/2010  . ANEMIA-NOS 06/24/2010    Jamiel Goncalves 06/02/2016, 3:42 PM  [facility] [address] [place], [place], [address] Phone: [phone]   Fax:  [phone]  Name: Cheryl [name] MRN: [id number] Date of Birth: [birth year]    Melvenia Needles, PTA 06/02/2016 3:42 PM Phone: [phone] Fax: [phone]

## 2016-06-02 NOTE — Patient Instructions (Signed)

## 2016-06-03 ENCOUNTER — Ambulatory Visit: Payer: Medicare Other | Admitting: Physical Therapy

## 2016-06-03 DIAGNOSIS — R531 Weakness: Secondary | ICD-10-CM

## 2016-06-03 DIAGNOSIS — R252 Cramp and spasm: Secondary | ICD-10-CM | POA: Diagnosis not present

## 2016-06-03 DIAGNOSIS — M542 Cervicalgia: Secondary | ICD-10-CM

## 2016-06-03 DIAGNOSIS — M25511 Pain in right shoulder: Secondary | ICD-10-CM | POA: Diagnosis not present

## 2016-06-03 DIAGNOSIS — Z9889 Other specified postprocedural states: Secondary | ICD-10-CM | POA: Diagnosis not present

## 2016-06-03 DIAGNOSIS — M25611 Stiffness of right shoulder, not elsewhere classified: Secondary | ICD-10-CM

## 2016-06-03 DIAGNOSIS — R293 Abnormal posture: Secondary | ICD-10-CM

## 2016-06-03 DIAGNOSIS — M6281 Muscle weakness (generalized): Secondary | ICD-10-CM | POA: Diagnosis not present

## 2016-06-03 NOTE — Patient Instructions (Addendum)
From exercise drawer: ER AROM with shoulder ER.  1-2 X a day,  10 X each.  Progress to band, yellow when able to do pain free.  Copyright  VHI. All rights reserved.

## 2016-06-03 NOTE — Therapy (Signed)
[place]her[name], [place], [address] Phone: [phone]   Fax:  [phone]  Physical Therapy Treatment  Patient Details  Name: Cheryl [name] MRN: [id number] Date of Birth: 1946-11-14 Referring Provider: Joni Fears, MD  Encounter Date: 06/03/2016      PT End of Session - 06/03/16 1248    Visit Number 3   Number of Visits 12   Date for PT Re-Evaluation 07/02/16   PT Start Time [id number]   PT Stop Time 1248   PT Time Calculation (min) 63 min   Activity Tolerance Patient tolerated treatment well   Behavior During Therapy [facility] for tasks assessed/performed      Past Medical History  Diagnosis Date  . Acid reflux   . Sleep apnea     had surgery to correct  . Sinus drainage   . Arthritis     Past Surgical History  Procedure Laterality Date  . Knee surgery      x2  . Rotator cuff repair      x2  . Trigger finger release    . Carpal tunnel release    . Colonoscopy  02/15/2005    [id number] small polyps ablated via cold biopsy, one from transverse colon and two from the rectum/Small external hemorrhoids  . Colonoscopy  07/10/2010    [id number] rectum/long redundant colon, polyps in the sigmoid, descending, hepatic flexure/ADENOMATOUS POLYPS. next TCS due 06/2015  . Esophagogastroduodenoscopy  1995    Gastritis  . Colonoscopy  1995    3 polyps, path revealed chronic colitis  . Esophagogastroduodenoscopy N/A 04/16/2013    RMR: HH  . Uvulopalatopharyngoplasty    . Shoulder arthroscopy with rotator cuff repair and subacromial decompression Right 01/21/2015    Procedure: RIGHT SHOULDER ARTHROSCOPIC DEBRIDEMNT OF G-H JOINT AND REMOVAL OF LOOSE BODIES,ARTHROSCOPIC SUBACROMIAL DECOMPRESSION,MINI OPEN RCT REPAIR WITH SUPPLEMENTAL [facility] PATCH;  Surgeon: Garald Balding, MD;  Location: [place];  Service: Orthopedics;  Laterality: Right;  . Colonoscopy N/A 08/05/2015    Procedure: COLONOSCOPY;  Surgeon: Daneil Dolin,  MD;  [facility];  Service: Endoscopy;  Laterality: N/A;  1000  . Esophagogastroduodenoscopy N/A 05/13/2016    Procedure: ESOPHAGOGASTRODUODENOSCOPY (EGD);  Surgeon: Daneil Dolin, MD;  [facility];  Service: Endoscopy;  Laterality: N/A;  145    There were no vitals filed for this visit.      Subjective Assessment - 06/03/16 1141    Subjective Arm pain less since th last session,  I sleept better with her neck support . Arm-6-7,  Neck  5-6/10   Currently in Pain? Yes   Pain Score 6    Pain Location Neck   Pain Orientation Right;Left                         [facility] Adult PT Treatment/Exercise - 06/03/16 0001    Self-Care   Self-Care Posture   Posture education about sore muscles may be related to posture, weakness vs something wrong with them.  They may relax then re-tighten if posture changes or if she tries to do something with compensations etc.    Neck Exercises: Seated   Other Seated Exercise scapular retraction 10 X with and without yellow band.  Band painful  unable to do with band and not move shoulder in extension   Other Seated Exercise deep neck flexor strengthening 10 X 5 seconds   Modalities   Modalities Moist Heat  Moist Heat Therapy   Number Minutes Moist Heat 15 Minutes   Moist Heat Location Cervical   Electrical Stimulation   Electrical Stimulation Location upper back, neck/shoulder   Electrical Stimulation Action IFC   Electrical Stimulation Parameters 12   Electrical Stimulation Goals Pain   Manual Therapy   Manual Therapy Soft tissue mobilization   Manual therapy comments instrument assist upper back and neck   Soft tissue mobilization neuromuscular trigger point release to soften neck/upper back/shoulder RTmore than LT                PT Education - 06/03/16 1248    Education provided Yes   Education Details scapular retraction.   Person(s) Educated Patient   Methods Explanation;Demonstration;Verbal  cues;Handout   Comprehension Verbalized understanding;Returned demonstration          PT Short Term Goals - 06/02/16 1539    PT SHORT TERM GOAL #1   Title "Independent with initial HEP   Baseline able to demonstrate 1 exercise correctly   Time 3   Period Weeks   Status On-going   PT SHORT TERM GOAL #2   Title "Report pain decrease from  7 /10 to   4 /10.   Time 3   Period Weeks   Status On-going   PT SHORT TERM GOAL #3   Title "Demonstrate and verbalize understanding of condition management including heat, positioning/posture, HEP.    Baseline education initiated   Time 3   Period Weeks   Status On-going           PT Long Term Goals - 05/21/16 1221    PT LONG TERM GOAL #1   Title "Pt will be independent with advanced HEP.    Time 6   Period Weeks   Status New   PT LONG TERM GOAL #2   Title "Pain will decrease to 1/10 with all functional activities   Time 6   Period Weeks   Status New   PT LONG TERM GOAL #4   Title She will be able to extend neck without shooting pain into RT shoulder or arm   Time 6   Period Weeks   Status New   PT LONG TERM GOAL #5   Title "FOTO will improve from  45%  to  30%   indicating improved functional mobility .    Time 9   Period Weeks   Status New               Plan - 06/03/16 1249    Clinical Impression Statement Reports less pain, sleeping improved.  Progress toward home exercise program.  Modalities and manual helpful.   PT Next Visit Plan cervical stabilization (reclined) upper trap stretch. manual and modalities as needed. Discuss use of posture techniques heat to help pain.   PT Home Exercise Plan scapular retraction and ER   Consulted and Agree with Plan of Care Patient      Patient will benefit from skilled therapeutic intervention in order to improve the following deficits and impairments:  Pain, Impaired UE functional use, Decreased activity tolerance, Decreased range of motion, Increased muscle spasms,  Postural dysfunction  Visit Diagnosis: Cervicalgia  Abnormal posture  Cramp and spasm  Decreased strength  Decreased ROM of right shoulder  Pain in joint, shoulder region, right  S/P arthroscopy of shoulder  S/P rotator cuff repair     Problem List Patient Active Problem List   Diagnosis Date Noted  . Gastric ulceration   .  GERD (gastroesophageal reflux disease) 05/11/2016  . Constipation 05/11/2016  . Hashimoto's thyroiditis 02/20/2016  . History of colonic polyps   . Diverticulosis of colon without hemorrhage   . Complete tear of right rotator cuff 01/21/2015  . Severe obesity (BMI >= 40) ([place]) 01/21/2015  . Nausea alone 04/04/2013  . H/O adenomatous polyp of colon 04/04/2013  . Fatty liver 11/11/2010  . RUQ PAIN 11/11/2010  . NONSPEC ELEVATION OF LEVELS OF TRANSAMINASE/LDH 11/11/2010  . COLONIC POLYPS, HYPERPLASTIC, HX OF 06/25/2010  . ANEMIA-NOS 06/24/2010    Cresencio Reesor 06/03/2016, 12:58 PM  [facility] [address] [place], [place], [address] Phone: [phone]   Fax:  [phone]  Name: TAISHA KNUCKLES MRN: [id number] Date of Birth: May 23, 1946    Melvenia Needles, PTA 06/03/2016 12:58 PM Phone: [phone] Fax: [phone]

## 2016-06-07 ENCOUNTER — Ambulatory Visit: Payer: Medicare Other | Admitting: Physical Therapy

## 2016-06-07 DIAGNOSIS — R252 Cramp and spasm: Secondary | ICD-10-CM | POA: Diagnosis not present

## 2016-06-07 DIAGNOSIS — M25511 Pain in right shoulder: Secondary | ICD-10-CM | POA: Diagnosis not present

## 2016-06-07 DIAGNOSIS — Z9889 Other specified postprocedural states: Secondary | ICD-10-CM | POA: Diagnosis not present

## 2016-06-07 DIAGNOSIS — R293 Abnormal posture: Secondary | ICD-10-CM

## 2016-06-07 DIAGNOSIS — M6281 Muscle weakness (generalized): Secondary | ICD-10-CM | POA: Diagnosis not present

## 2016-06-07 DIAGNOSIS — R531 Weakness: Secondary | ICD-10-CM

## 2016-06-07 DIAGNOSIS — M542 Cervicalgia: Secondary | ICD-10-CM | POA: Diagnosis not present

## 2016-06-07 NOTE — Patient Instructions (Signed)
Continue to work on posture

## 2016-06-07 NOTE — Therapy (Signed)
[place], [place], [address] Phone: [phone]   Fax:  [phone]  Physical Therapy Treatment  Patient Details  Name: Cheryl [name] MRN: [id number] Date of Birth: July 28, 1946 Referring Provider: Joni Fears, MD  Encounter Date: 06/07/2016      PT End of Session - 06/07/16 1159    Visit Number 4   Number of Visits 12   Date for PT Re-Evaluation 07/02/16   PT Start Time 1103   PT Stop Time 1206   PT Time Calculation (min) 63 min   Activity Tolerance Patient tolerated treatment well   Behavior During Therapy [facility] for tasks assessed/performed      Past Medical History  Diagnosis Date  . Acid reflux   . Sleep apnea     had surgery to correct  . Sinus drainage   . Arthritis     Past Surgical History  Procedure Laterality Date  . Knee surgery      x2  . Rotator cuff repair      x2  . Trigger finger release    . Carpal tunnel release    . Colonoscopy  02/15/2005    [id number] small polyps ablated via cold biopsy, one from transverse colon and two from the rectum/Small external hemorrhoids  . Colonoscopy  07/10/2010    [id number] rectum/long redundant colon, polyps in the sigmoid, descending, hepatic flexure/ADENOMATOUS POLYPS. next TCS due 06/2015  . Esophagogastroduodenoscopy  1995    Gastritis  . Colonoscopy  1995    3 polyps, path revealed chronic colitis  . Esophagogastroduodenoscopy N/A 04/16/2013    RMR: HH  . Uvulopalatopharyngoplasty    . Shoulder arthroscopy with rotator cuff repair and subacromial decompression Right 01/21/2015    Procedure: RIGHT SHOULDER ARTHROSCOPIC DEBRIDEMNT OF G-H JOINT AND REMOVAL OF LOOSE BODIES,ARTHROSCOPIC SUBACROMIAL DECOMPRESSION,MINI OPEN RCT REPAIR WITH SUPPLEMENTAL [facility] PATCH;  Surgeon: Garald Balding, MD;  Location: [place];  Service: Orthopedics;  Laterality: Right;  . Colonoscopy N/A 08/05/2015    Procedure: COLONOSCOPY;  Surgeon: Daneil Dolin,  MD;  [facility];  Service: Endoscopy;  Laterality: N/A;  1000  . Esophagogastroduodenoscopy N/A 05/13/2016    Procedure: ESOPHAGOGASTRODUODENOSCOPY (EGD);  Surgeon: Daneil Dolin, MD;  [facility];  Service: Endoscopy;  Laterality: N/A;  145    There were no vitals filed for this visit.      Subjective Assessment - 06/07/16 1106    Subjective Arm pain continue.  Neck and back pain are better.  I can hardly move my arm.  arm pain is 10 X 10   Currently in Pain? Yes   Pain Score 10-Worst pain ever   Pain Location Arm   Pain Orientation Right;Left  rt>lt   Pain Descriptors / Indicators Throbbing   Pain Radiating Towards TO RIGHT ELBOW,  LT DELTIOD   Pain Frequency Constant   Aggravating Factors  reaching,    Pain Relieving Factors tylenot, heat , ice,                           [facility] Adult PT Treatment/Exercise - 06/07/16 0001    Moist Heat Therapy   Number Minutes Moist Heat 15 Minutes   Moist Heat Location Cervical   Electrical Stimulation   Electrical Stimulation Location upper back, neck/shoulder   Electrical Stimulation Action IFC   Electrical Stimulation Parameters 12   Electrical Stimulation Goals Pain   Traction  Type of Traction Cervical  Has head pain top of her head.  post traction,  did not repo   Min (lbs) 5   Max (lbs) 15   Hold Time --  pre set holds,  2 steps ascending and descending each   Time 17  decreased pain to 8-9/10.  Radiating to dista arm(2" above e                  PT Short Term Goals - 06/07/16 1206    PT SHORT TERM GOAL #1   Title "Independent with initial HEP   Time 3   Period Weeks   Status On-going   PT SHORT TERM GOAL #2   Title "Report pain decrease from  7 /10 to   4 /10.   Baseline 8-10/10   Time 3   Period Weeks   Status On-going   PT SHORT TERM GOAL #3   Title "Demonstrate and verbalize understanding of condition management including heat, positioning/posture, HEP.     Baseline she understands.  She will try a new pillow tonight.  she says she is using good posture.    Time 3   Period Weeks   Status Partially Met           PT Long Term Goals - 05/21/16 1221    PT LONG TERM GOAL #1   Title "Pt will be independent with advanced HEP.    Time 6   Period Weeks   Status New   PT LONG TERM GOAL #2   Title "Pain will decrease to 1/10 with all functional activities   Time 6   Period Weeks   Status New   PT LONG TERM GOAL #4   Title She will be able to extend neck without shooting pain into RT shoulder or arm   Time 6   Period Weeks   Status New   PT LONG TERM GOAL #5   Title "FOTO will improve from  45%  to  30%   indicating improved functional mobility .    Time 9   Period Weeks   Status New               Plan - 06/07/16 1200    Clinical Impression Statement Sleeping has changed back to keeping her up at night.  She is going to get a "My pillow"  today to see if that works, (Her idea VS mine)  Pain reported top of head after traction and resolved after sitting a few minutes.  Pain RT arm less 8-9/10 post traction  vs 10/10 originally reported.  (No physical demonstrations of 10/10 pain,  sitting quietly,  face without grimace, etc)  Pain 2 inches less post traction.  .  While supine patient could not tell if she had arm pain.  No new goals met.    PT Next Visit Plan assess traction.  Cervical stabilization.  to home exercises.    PT Home Exercise Plan continue   Consulted and Agree with Plan of Care Patient      Patient will benefit from skilled therapeutic intervention in order to improve the following deficits and impairments:  Pain, Impaired UE functional use, Decreased activity tolerance, Decreased range of motion, Increased muscle spasms, Postural dysfunction  Visit Diagnosis: Cervicalgia  Abnormal posture  Cramp and spasm  Decreased strength     Problem List Patient Active Problem List   Diagnosis Date Noted  .  Gastric ulceration   . GERD (gastroesophageal reflux  disease) 05/11/2016  . Constipation 05/11/2016  . Hashimoto's thyroiditis 02/20/2016  . History of colonic polyps   . Diverticulosis of colon without hemorrhage   . Complete tear of right rotator cuff 01/21/2015  . Severe obesity (BMI >= 40) ([place]) 01/21/2015  . Nausea alone 04/04/2013  . H/O adenomatous polyp of colon 04/04/2013  . Fatty liver 11/11/2010  . RUQ PAIN 11/11/2010  . NONSPEC ELEVATION OF LEVELS OF TRANSAMINASE/LDH 11/11/2010  . COLONIC POLYPS, HYPERPLASTIC, HX OF 06/25/2010  . ANEMIA-NOS 06/24/2010    Elah Avellino 06/07/2016, 12:12 PM  [facility] [address] [place], [place], [address] Phone: [phone]   Fax:  [phone]  Name: RICKEYA MANUS MRN: [id number] Date of Birth: Apr 03, 1946    Melvenia Needles, PTA 06/07/2016 12:12 PM Phone: [phone] Fax: [phone]

## 2016-06-09 ENCOUNTER — Telehealth: Payer: Self-pay | Admitting: Internal Medicine

## 2016-06-09 ENCOUNTER — Ambulatory Visit: Payer: Medicare Other | Admitting: Physical Therapy

## 2016-06-09 DIAGNOSIS — M542 Cervicalgia: Secondary | ICD-10-CM | POA: Diagnosis not present

## 2016-06-09 DIAGNOSIS — R252 Cramp and spasm: Secondary | ICD-10-CM | POA: Diagnosis not present

## 2016-06-09 DIAGNOSIS — R293 Abnormal posture: Secondary | ICD-10-CM

## 2016-06-09 DIAGNOSIS — M6281 Muscle weakness (generalized): Secondary | ICD-10-CM | POA: Diagnosis not present

## 2016-06-09 DIAGNOSIS — Z9889 Other specified postprocedural states: Secondary | ICD-10-CM | POA: Diagnosis not present

## 2016-06-09 DIAGNOSIS — M25511 Pain in right shoulder: Secondary | ICD-10-CM | POA: Diagnosis not present

## 2016-06-09 NOTE — Telephone Encounter (Signed)
Pt said she is having one really good bm daily on linzess 145. Advised her that this is what we are hoping for. She would like to have an rx sent in to her pharmacy to see if she can afford it.

## 2016-06-09 NOTE — Patient Instructions (Addendum)
Strengthening: Rotation - Isometric (in Neutral)    Using light pressure from fingertips at right temple, resist turning head. Hold 5____ seconds. Repeat _10___ times per set. Do1 ____ sets per session. Do 1-2____ sessions per day.  http://orth.exer.us/305   Copyright  VHI. All rights reserved.  Strengthening: Extension - Isometric (in Neutral)    Using light pressure from fingertips at back of head, resist bending head backward. Hold ___5_ seconds. Repeat 10____ times per set. Do _1___ sets per session. Do1-2 ____ sessions per day.  http://orth.exer.us/309   Copyright  VHI. All rights reserved.  Neck: Lateral Tilt    Sit with back straight. Tilt head slightly to right while resisting with fingertips. Do not let head move. Do not allow trunk to lean sideways. Hold _5___ seconds. Repeat _10__ times. Do 1-2____ sessions per day. CAUTION: Use steady pressure with fingertips only.  Copyright  VHI. All rights reserved.  Neck Flexors    Sit with back straight. Tilt head slightly forward while resisting with fingertips. Do not let head move. Keep chin tucked. Do not allow trunk to lean forward. Hold ___5_ seconds. Repeat _10___ times. Do _1-2___ sessions per day. CAUTION: Use steady pressure with fingertips only.  Copyright  VHI. All rights reserved.

## 2016-06-09 NOTE — Therapy (Signed)
[place], [place], [address] Phone: [phone]   Fax:  [phone]  Physical Therapy Treatment  Patient Details  Name: Cheryl [name] MRN: [id number] Date of Birth: 1946-10-28 Referring Provider: Joni Fears, MD  Encounter Date: 06/09/2016      PT End of Session - 06/09/16 1214    Visit Number 5   Number of Visits 12   Date for PT Re-Evaluation 07/02/16   PT Start Time 1018   PT Stop Time 1122   PT Time Calculation (min) 64 min   Activity Tolerance Patient tolerated treatment well   Behavior During Therapy [facility] for tasks assessed/performed      Past Medical History  Diagnosis Date  . Acid reflux   . Sleep apnea     had surgery to correct  . Sinus drainage   . Arthritis     Past Surgical History  Procedure Laterality Date  . Knee surgery      x2  . Rotator cuff repair      x2  . Trigger finger release    . Carpal tunnel release    . Colonoscopy  02/15/2005    MBE:MLJQG small polyps ablated via cold biopsy, one from transverse colon and two from the rectum/Small external hemorrhoids  . Colonoscopy  07/10/2010    BEE:FEOFHQ rectum/long redundant colon, polyps in the sigmoid, descending, hepatic flexure/ADENOMATOUS POLYPS. next TCS due 06/2015  . Esophagogastroduodenoscopy  1995    Gastritis  . Colonoscopy  1995    3 polyps, path revealed chronic colitis  . Esophagogastroduodenoscopy N/A 04/16/2013    RMR: HH  . Uvulopalatopharyngoplasty    . Shoulder arthroscopy with rotator cuff repair and subacromial decompression Right 01/21/2015    Procedure: RIGHT SHOULDER ARTHROSCOPIC DEBRIDEMNT OF G-H JOINT AND REMOVAL OF LOOSE BODIES,ARTHROSCOPIC SUBACROMIAL DECOMPRESSION,MINI OPEN RCT REPAIR WITH SUPPLEMENTAL [facility] PATCH;  Surgeon: Garald Balding, MD;  Location: [place];  Service: Orthopedics;  Laterality: Right;  . Colonoscopy N/A 08/05/2015    Procedure: COLONOSCOPY;  Surgeon: Daneil Dolin,  MD;  [facility];  Service: Endoscopy;  Laterality: N/A;  1000  . Esophagogastroduodenoscopy N/A 05/13/2016    Procedure: ESOPHAGOGASTRODUODENOSCOPY (EGD);  Surgeon: Daneil Dolin, MD;  [facility];  Service: Endoscopy;  Laterality: N/A;  145    There were no vitals filed for this visit.      Subjective Assessment - 06/09/16 1019    Subjective Traction did not help.  7/10.  I can't use my arm like I want to.  Driving especilly backinging up.  I'm stim still safe to drive.  Having less pain ,, able to usr arm to pull herself up in the morning a little easier,   She needs to place her arm carefull to get to sleep.  She has been doing her stretching and scapular squeezes.     Currently in Pain? Yes   Pain Score 7    Pain Location Arm   Pain Orientation Right;Left                         [facility] Adult PT Treatment/Exercise - 06/09/16 0001    Neck Exercises: Seated   Cervical Isometrics Flexion;Extension;Right lateral flexion;Left lateral flexion;Right rotation;Left rotation;5 secs;10 reps;Limitations  Lateral rt painful,  No pain LT   Cervical Isometrics Limitations cues to avoid pain   Cervical Rotation 10 reps  5 seconds   Lateral Flexion 10 reps  5 seconds , HEP   Lateral Flexion Limitations Also flexion/extension resistance   Shoulder Exercises: Pulleys   Flexion --  5 minutes   Flexion Limitations felt good,  some arm pain with this.    Modalities   Modalities Moist Heat   Moist Heat Therapy   Number Minutes Moist Heat 15 Minutes   Moist Heat Location Cervical   Electrical Stimulation   Electrical Stimulation Location upper back, neck/shoulder   Electrical Stimulation Action IFC   Electrical Stimulation Parameters 13   Electrical Stimulation Goals Pain   Manual Therapy   Manual Therapy Soft tissue mobilization   Manual therapy comments noted less imtense pain and pain does not go down arm as far post manual.    Soft tissue  mobilization neuromuscular trigger point release to soften neck/upper back/shoulder RTmore than LT  stretching with tissue strumming to lengthen                PT Education - 06/09/16 1214    Education provided Yes   Education Details isometrics   Person(s) Educated Patient   Methods Explanation;Demonstration;Verbal cues;Handout   Comprehension Verbalized understanding;Returned demonstration          PT Short Term Goals - 06/07/16 1206    PT SHORT TERM GOAL #1   Title "Independent with initial HEP   Time 3   Period Weeks   Status On-going   PT SHORT TERM GOAL #2   Title "Report pain decrease from  7 /10 to   4 /10.   Baseline 8-10/10   Time 3   Period Weeks   Status On-going   PT SHORT TERM GOAL #3   Title "Demonstrate and verbalize understanding of condition management including heat, positioning/posture, HEP.    Baseline she understands.  She will try a new pillow tonight.  she says she is using good posture.    Time 3   Period Weeks   Status Partially Met           PT Long Term Goals - 05/21/16 1221    PT LONG TERM GOAL #1   Title "Pt will be independent with advanced HEP.    Time 6   Period Weeks   Status New   PT LONG TERM GOAL #2   Title "Pain will decrease to 1/10 with all functional activities   Time 6   Period Weeks   Status New   PT LONG TERM GOAL #4   Title She will be able to extend neck without shooting pain into RT shoulder or arm   Time 6   Period Weeks   Status New   PT LONG TERM GOAL #5   Title "FOTO will improve from  45%  to  30%   indicating improved functional mobility .    Time 9   Period Weeks   Status New               Plan - 06/09/16 1215    Clinical Impression Statement Able to add to her home exercises today.  Isometrics helped her neck/shoulder feel less tight.  Manual is helping to centralize pain and decrease pain.  Grip strength RT average 26.6,  LT 24.3 pounds. Arm pain continueswith less intensity and  covers less of her arm at times.     PT Next Visit Plan review isometrics ,  continue stabilization try bands for rows.  manual and modalitis as needed. Measure ROM   PT Home Exercise Plan isometrics.  She  will try to see if she can prevent pain from getting worse,  try to correct what ever activity is irritating.       Patient will benefit from skilled therapeutic intervention in order to improve the following deficits and impairments:  Pain, Impaired UE functional use, Decreased activity tolerance, Decreased range of motion, Increased muscle spasms, Postural dysfunction  Visit Diagnosis: Cervicalgia  Abnormal posture  Cramp and spasm     Problem List Patient Active Problem List   Diagnosis Date Noted  . Gastric ulceration   . GERD (gastroesophageal reflux disease) 05/11/2016  . Constipation 05/11/2016  . Hashimoto's thyroiditis 02/20/2016  . History of colonic polyps   . Diverticulosis of colon without hemorrhage   . Complete tear of right rotator cuff 01/21/2015  . Severe obesity (BMI >= 40) ([place]) 01/21/2015  . Nausea alone 04/04/2013  . H/O adenomatous polyp of colon 04/04/2013  . Fatty liver 11/11/2010  . RUQ PAIN 11/11/2010  . NONSPEC ELEVATION OF LEVELS OF TRANSAMINASE/LDH 11/11/2010  . COLONIC POLYPS, HYPERPLASTIC, HX OF 06/25/2010  . ANEMIA-NOS 06/24/2010    Dilon Lank 06/09/2016, 12:33 PM  [facility] [address] [place], [place], [address] Phone: [phone]   Fax:  [phone]  Name: TERIANN LIVINGOOD MRN: [id number] Date of Birth: 10-10-1946    Melvenia Needles, PTA 06/09/2016 12:33 PM Phone: [phone] Fax: [phone]

## 2016-06-09 NOTE — Telephone Encounter (Signed)
[phone]  PLEASE CALL PATIENT, MEDS ERIC GAVE HER, [employer]ZESS, ONLY WORKS ONCE A DAY.  NEEDS TO KNOW IF THAT IS THE WAY ITS SUPPOSED TO WORK,  SHE IS OUT OF SAMPLES.  PATIENT USES [facility] ON [address] IN [place]

## 2016-06-10 MED ORDER — LINACLOTIDE 145 MCG PO CAPS: 145.0000 ug | ORAL_CAPSULE | Freq: Every day | ORAL | Status: DC

## 2016-06-10 NOTE — Telephone Encounter (Signed)
rx done

## 2016-06-10 NOTE — Addendum Note (Signed)
Addended by: Mahala Menghini on: 06/10/2016 08:12 AM   Modules accepted: Orders

## 2016-06-14 ENCOUNTER — Ambulatory Visit: Payer: Medicare Other | Admitting: Physical Therapy

## 2016-06-14 DIAGNOSIS — R293 Abnormal posture: Secondary | ICD-10-CM | POA: Diagnosis not present

## 2016-06-14 DIAGNOSIS — Z9889 Other specified postprocedural states: Secondary | ICD-10-CM | POA: Diagnosis not present

## 2016-06-14 DIAGNOSIS — R252 Cramp and spasm: Secondary | ICD-10-CM

## 2016-06-14 DIAGNOSIS — M6281 Muscle weakness (generalized): Secondary | ICD-10-CM | POA: Diagnosis not present

## 2016-06-14 DIAGNOSIS — M542 Cervicalgia: Secondary | ICD-10-CM

## 2016-06-14 DIAGNOSIS — R531 Weakness: Secondary | ICD-10-CM

## 2016-06-14 DIAGNOSIS — M25611 Stiffness of right shoulder, not elsewhere classified: Secondary | ICD-10-CM

## 2016-06-14 DIAGNOSIS — M25511 Pain in right shoulder: Secondary | ICD-10-CM | POA: Diagnosis not present

## 2016-06-14 NOTE — Therapy (Signed)
[name], [place], [address] Phone: [phone]   Fax:  [phone]  Physical Therapy Treatment  Patient Details  Name: Cheryl [name] MRN: [id number] Date of Birth: Apr 23, 1946 Referring Provider: Joni Fears, MD  Encounter Date: 06/14/2016      PT End of Session - 06/14/16 1247    Visit Number 6   Number of Visits 12   Date for PT Re-Evaluation 07/02/16   PT Start Time 1150   PT Stop Time 1255   PT Time Calculation (min) 65 min   Activity Tolerance Patient tolerated treatment well   Behavior During Therapy [facility] for tasks assessed/performed      Past Medical History  Diagnosis Date  . Acid reflux   . Sleep apnea     had surgery to correct  . Sinus drainage   . Arthritis     Past Surgical History  Procedure Laterality Date  . Knee surgery      x2  . Rotator cuff repair      x2  . Trigger finger release    . Carpal tunnel release    . Colonoscopy  02/15/2005    JWL:KHVFM small polyps ablated via cold biopsy, one from transverse colon and two from the rectum/Small external hemorrhoids  . Colonoscopy  07/10/2010    BBU:YZJQDU rectum/long redundant colon, polyps in the sigmoid, descending, hepatic flexure/ADENOMATOUS POLYPS. next TCS due 06/2015  . Esophagogastroduodenoscopy  1995    Gastritis  . Colonoscopy  1995    3 polyps, path revealed chronic colitis  . Esophagogastroduodenoscopy N/A 04/16/2013    RMR: HH  . Uvulopalatopharyngoplasty    . Shoulder arthroscopy with rotator cuff repair and subacromial decompression Right 01/21/2015    Procedure: RIGHT SHOULDER ARTHROSCOPIC DEBRIDEMNT OF G-H JOINT AND REMOVAL OF LOOSE BODIES,ARTHROSCOPIC SUBACROMIAL DECOMPRESSION,MINI OPEN RCT REPAIR WITH SUPPLEMENTAL [facility] PATCH;  Surgeon: Garald Balding, MD;  Location: [place];  Service: Orthopedics;  Laterality: Right;  . Colonoscopy N/A 08/05/2015    Procedure: COLONOSCOPY;  Surgeon: Daneil Dolin,  MD;  [facility];  Service: Endoscopy;  Laterality: N/A;  1000  . Esophagogastroduodenoscopy N/A 05/13/2016    Procedure: ESOPHAGOGASTRODUODENOSCOPY (EGD);  Surgeon: Daneil Dolin, MD;  [facility];  Service: Endoscopy;  Laterality: N/A;  145    There were no vitals filed for this visit.      Subjective Assessment - 06/14/16 1150    Subjective Neck is doing pretty good.  Stretching  with pulleys helped my arm.  Left arm pain improving.  Able to reach briefly and bring ( 2 hands )  a cannister out of shelf overhead. .   Currently in Pain? Yes   Pain Score 6    Pain Location Arm   Pain Orientation Right   Pain Descriptors / Indicators Throbbing;Heaviness   Pain Radiating Towards To distal deltoiod.    Pain Frequency Constant   Aggravating Factors  using it irritated,  sleeping on it.    Pain Relieving Factors tylenol, heat. ice, stretching.             [facility] PT Assessment - 06/14/16 0001    AROM   Right Shoulder Flexion 130 Degrees  [id number]   Right Shoulder ABduction 105 Degrees  123 on pulleys   Right Shoulder External Rotation 12 Degrees   Cervical - Right Side Bend 47   Cervical - Left Side Bend 40   Cervical - Right Rotation 80  Cervical - Left Rotation 60                     [facility] Adult PT Treatment/Exercise - 06/14/16 0001    Neck Exercises: Seated   Cervical Isometrics --  4 way no pain today 5 X 5 seconds each   Cervical Isometrics Limitations 0   Shoulder Exercises: Pulleys   Flexion --  5 minutes,  felt good , but still had some arm pain.   Moist Heat Therapy   Number Minutes Moist Heat 15 Minutes   Moist Heat Location Cervical   Electrical Stimulation   Electrical Stimulation Location upper back, neck/shoulder   Electrical Stimulation Action IFC   Electrical Stimulation Parameters 13   Electrical Stimulation Goals Pain   Manual Therapy   Manual Therapy Soft tissue mobilization;Passive ROM   Manual therapy  comments Right shoulder ER,  flexion, Abduction.  ER improved post stretch.  Flexion and extinsion WNL with PROM.     Soft tissue mobilization soft tissue work neck, shoulder                  PT Short Term Goals - 06/14/16 1254    PT SHORT TERM GOAL #1   Title "Independent with initial HEP   Baseline Independent (06/14/2016)   Time 3   Period Weeks   Status Achieved   PT SHORT TERM GOAL #2   Title "Report pain decrease from  7 /10 to   4 /10.   Baseline 6-7/10   Time 3   Period Weeks   Status On-going   PT SHORT TERM GOAL #3   Title "Demonstrate and verbalize understanding of condition management including heat, positioning/posture, HEP.    Baseline Pain at night not helped with new pillow.  She continues to read at night, will discuss reading posture next visit.    Time 3   Period Weeks   Status Partially Met           PT Long Term Goals - 05/21/16 1221    PT LONG TERM GOAL #1   Title "Pt will be independent with advanced HEP.    Time 6   Period Weeks   Status New   PT LONG TERM GOAL #2   Title "Pain will decrease to 1/10 with all functional activities   Time 6   Period Weeks   Status New   PT LONG TERM GOAL #4   Title She will be able to extend neck without shooting pain into RT shoulder or arm   Time 6   Period Weeks   Status New   PT LONG TERM GOAL #5   Title "FOTO will improve from  45%  to  30%   indicating improved functional mobility .    Time 9   Period Weeks   Status New               Plan - 06/14/16 1251    Clinical Impression Statement Patient showing improvement with ROM in both neck and Right shoulder.  Pain into right arm not going to elbow, now going to distal spine.  STG#1 met.    PT Next Visit Plan Check neck extension ROM.  continue pulleys try bands for rows .  Modalities as needed.  Discuss reading posture at night.   Consulted and Agree with Plan of Care Patient      Patient will benefit from skilled therapeutic  intervention in order to improve the following deficits  and impairments:  Pain, Impaired UE functional use, Decreased activity tolerance, Decreased range of motion, Increased muscle spasms, Postural dysfunction  Visit Diagnosis: Cervicalgia  Abnormal posture  Cramp and spasm  Decreased strength  Decreased ROM of right shoulder     Problem List Patient Active Problem List   Diagnosis Date Noted  . Gastric ulceration   . GERD (gastroesophageal reflux disease) 05/11/2016  . Constipation 05/11/2016  . Hashimoto's thyroiditis 02/20/2016  . History of colonic polyps   . Diverticulosis of colon without hemorrhage   . Complete tear of right rotator cuff 01/21/2015  . Severe obesity (BMI >= 40) ([name]) 01/21/2015  . Nausea alone 04/04/2013  . H/O adenomatous polyp of colon 04/04/2013  . Fatty liver 11/11/2010  . RUQ PAIN 11/11/2010  . NONSPEC ELEVATION OF LEVELS OF TRANSAMINASE/LDH 11/11/2010  . COLONIC POLYPS, HYPERPLASTIC, HX OF 06/25/2010  . ANEMIA-NOS 06/24/2010    HARRIS,KAREN 06/14/2016, 1:06 PM  [facility] [address] [place], [place], [address] Phone: [phone]   Fax:  [phone]  Name: JAMILEE LAFOSSE MRN: [id number] Date of Birth: [birth year]    Melvenia Needles, PTA 06/14/2016 1:06 PM Phone: [phone] Fax: [phone]

## 2016-06-16 ENCOUNTER — Ambulatory Visit: Payer: Medicare Other

## 2016-06-16 DIAGNOSIS — Z9889 Other specified postprocedural states: Secondary | ICD-10-CM | POA: Diagnosis not present

## 2016-06-16 DIAGNOSIS — R252 Cramp and spasm: Secondary | ICD-10-CM | POA: Diagnosis not present

## 2016-06-16 DIAGNOSIS — M542 Cervicalgia: Secondary | ICD-10-CM

## 2016-06-16 DIAGNOSIS — R531 Weakness: Secondary | ICD-10-CM

## 2016-06-16 DIAGNOSIS — R293 Abnormal posture: Secondary | ICD-10-CM

## 2016-06-16 DIAGNOSIS — M25511 Pain in right shoulder: Secondary | ICD-10-CM | POA: Diagnosis not present

## 2016-06-16 DIAGNOSIS — M6281 Muscle weakness (generalized): Secondary | ICD-10-CM | POA: Diagnosis not present

## 2016-06-16 NOTE — Therapy (Signed)
[place], [place], [address] Phone: [phone]   Fax:  [phone]  Physical Therapy Treatment  Patient Details  Name: Cheryl [name] MRN: [id number] Date of Birth: [birth year] Referring Provider: Joni Fears, MD  Encounter Date: 06/16/2016      PT End of Session - 06/16/16 1327    Visit Number 7   Number of Visits 12   Date for PT Re-Evaluation 07/02/16   PT Start Time 1230   PT Stop Time 1333   PT Time Calculation (min) 63 min   Activity Tolerance Patient tolerated treatment well   Behavior During Therapy [facility] for tasks assessed/performed      Past Medical History  Diagnosis Date  . Acid reflux   . Sleep apnea     had surgery to correct  . Sinus drainage   . Arthritis     Past Surgical History  Procedure Laterality Date  . Knee surgery      x2  . Rotator cuff repair      x2  . Trigger finger release    . Carpal tunnel release    . Colonoscopy  02/15/2005    ONG:EXBMW small polyps ablated via cold biopsy, one from transverse colon and two from the rectum/Small external hemorrhoids  . Colonoscopy  07/10/2010    UXL:KGMWNU rectum/long redundant colon, polyps in the sigmoid, descending, hepatic flexure/ADENOMATOUS POLYPS. next TCS due 06/2015  . Esophagogastroduodenoscopy  1995    Gastritis  . Colonoscopy  1995    3 polyps, path revealed chronic colitis  . Esophagogastroduodenoscopy N/A 04/16/2013    RMR: HH  . Uvulopalatopharyngoplasty    . Shoulder arthroscopy with rotator cuff repair and subacromial decompression Right 01/21/2015    Procedure: RIGHT SHOULDER ARTHROSCOPIC DEBRIDEMNT OF G-H JOINT AND REMOVAL OF LOOSE BODIES,ARTHROSCOPIC SUBACROMIAL DECOMPRESSION,MINI OPEN RCT REPAIR WITH SUPPLEMENTAL [facility] PATCH;  Surgeon: Garald Balding, MD;  Location: [place];  Service: Orthopedics;  Laterality: Right;  . Colonoscopy N/A 08/05/2015    Procedure: COLONOSCOPY;  Surgeon: Daneil Dolin,  MD;  [facility];  Service: Endoscopy;  Laterality: N/A;  1000  . Esophagogastroduodenoscopy N/A 05/13/2016    Procedure: ESOPHAGOGASTRODUODENOSCOPY (EGD);  Surgeon: Daneil Dolin, MD;  [facility];  Service: Endoscopy;  Laterality: N/A;  145    There were no vitals filed for this visit.      Subjective Assessment - 06/16/16 1239    Subjective Neck pain comes and goes  Today a 6    Currently in Pain? Yes   Pain Score 6    Pain Location Shoulder   Pain Orientation Right   Pain Descriptors / Indicators Throbbing;Heaviness   Pain Type Chronic pain   Pain Radiating Towards distal to deltoid   Pain Onset More than a month ago   Pain Frequency Constant   Aggravating Factors  using arm   Pain Relieving Factors meds , heat, cold   Multiple Pain Sites No            [facility] PT Assessment - 06/16/16 0001    AROM   Right Shoulder Flexion 130 Degrees  more into scaption   Right Shoulder ABduction 134 Degrees   Right Shoulder Internal Rotation 40 Degrees   Right Shoulder External Rotation 42 Degrees   Cervical Flexion 30   Cervical Extension 40  shooting pain into RT arm   Cervical - Right Side Bend 37   Cervical - Left Side Bend 40  Cervical - Right Rotation 75   Cervical - Left Rotation 75                     [facility] Adult PT Treatment/Exercise - 06/16/16 1241    Shoulder Exercises: Pulleys   Flexion 2 minutes   ABduction 2 minutes   Moist Heat Therapy   Number Minutes Moist Heat 18 Minutes   Moist Heat Location Cervical  and RT shoulder   Electrical Stimulation   Electrical Stimulation Location RTR shoulder and neck   Electrical Stimulation Action IFC   Electrical Stimulation Parameters L18   Electrical Stimulation Goals Pain   Manual Therapy   Soft tissue mobilization soft tissue work neck, shoulder    with use of rock tool from deltoid to cervical spine.   Gentle stretching RT shoulder all planes  and to neck rotation and  sidebend            PT Short Term Goals - 06/14/16 1254    PT SHORT TERM GOAL #1   Title "Independent with initial HEP   Baseline Independent (06/14/2016)   Time 3   Period Weeks   Status Achieved   PT SHORT TERM GOAL #2   Title "Report pain decrease from  7 /10 to   4 /10.   Baseline 6-7/10   Time 3   Period Weeks   Status On-going   PT SHORT TERM GOAL #3   Title "Demonstrate and verbalize understanding of condition management including heat, positioning/posture, HEP.    Baseline Pain at night not helped with new pillow.  She continues to read at night, will discuss reading posture next visit.    Time 3   Period Weeks   Status Partially Met           PT Long Term Goals - 06/16/16 1332    PT LONG TERM GOAL #1   Title "Pt will be independent with advanced HEP.    Status On-going   PT LONG TERM GOAL #2   Title "Pain will decrease to 1/10 with all functional activities   Status On-going   PT LONG TERM GOAL #3   Title "R shoulder AROM scaption will improve to 0-120  degrees for improved overhead reaching to reach dishes and retrieve items from freezer.   Status Achieved   PT LONG TERM GOAL #4   Title She will be able to extend neck without shooting pain into RT shoulder or arm   Status On-going   PT LONG TERM GOAL #5   Title "FOTO will improve from  45%  to  30%   indicating improved functional mobility .    Status Unable to assess               Plan - 06/16/16 1327    Clinical Impression Statement ROM improved  but pain levels remain moderate to severe. Will complete plan of care but if pain not imroved with discharge at visit 12. We have used manual and modalities and this should have some signifiicant/lasting  decrease in pain.. We had discussed posture and suppport for arms in past to decrease strain to neck and shoulders   PT Treatment/Interventions Cryotherapy;Electrical Stimulation;Iontophoresis 49m/ml Dexamethasone;Moist  Heat;Traction;Ultrasound;Therapeutic exercise;Patient/family education;Manual techniques;Passive range of motion;Taping;Dry needling   PT Next Visit Plan Add band for shoulder , manual and modalities. check nec extension and flexion   Consulted and Agree with Plan of Care Patient      Patient will benefit from skilled  therapeutic intervention in order to improve the following deficits and impairments:  Pain, Impaired UE functional use, Decreased activity tolerance, Decreased range of motion, Increased muscle spasms, Postural dysfunction  Visit Diagnosis: Cervicalgia  Abnormal posture  Cramp and spasm  Decreased strength     Problem List Patient Active Problem List   Diagnosis Date Noted  . Gastric ulceration   . GERD (gastroesophageal reflux disease) 05/11/2016  . Constipation 05/11/2016  . Hashimoto's thyroiditis 02/20/2016  . History of colonic polyps   . Diverticulosis of colon without hemorrhage   . Complete tear of right rotator cuff 01/21/2015  . Severe obesity (BMI >= 40) ([place]) 01/21/2015  . Nausea alone 04/04/2013  . H/O adenomatous polyp of colon 04/04/2013  . Fatty liver 11/11/2010  . RUQ PAIN 11/11/2010  . NONSPEC ELEVATION OF LEVELS OF TRANSAMINASE/LDH 11/11/2010  . COLONIC POLYPS, HYPERPLASTIC, HX OF 06/25/2010  . ANEMIA-NOS 06/24/2010    Darrel Hoover  PT 06/16/2016, 1:46 PM  [facility] [address] [place], [place], [address] Phone: [phone]   Fax:  [phone]  Name: Cheryl [name] MRN: [id number] Date of Birth: 23-Feb-1946

## 2016-06-21 ENCOUNTER — Ambulatory Visit: Payer: Medicare Other | Attending: Family Medicine | Admitting: Physical Therapy

## 2016-06-21 DIAGNOSIS — M6281 Muscle weakness (generalized): Secondary | ICD-10-CM | POA: Insufficient documentation

## 2016-06-21 DIAGNOSIS — R252 Cramp and spasm: Secondary | ICD-10-CM | POA: Diagnosis not present

## 2016-06-21 DIAGNOSIS — R293 Abnormal posture: Secondary | ICD-10-CM | POA: Diagnosis not present

## 2016-06-21 DIAGNOSIS — M542 Cervicalgia: Secondary | ICD-10-CM | POA: Diagnosis not present

## 2016-06-21 DIAGNOSIS — M25511 Pain in right shoulder: Secondary | ICD-10-CM | POA: Diagnosis not present

## 2016-06-21 DIAGNOSIS — M25611 Stiffness of right shoulder, not elsewhere classified: Secondary | ICD-10-CM

## 2016-06-21 DIAGNOSIS — R531 Weakness: Secondary | ICD-10-CM

## 2016-06-21 NOTE — Therapy (Signed)
[place], [place], [address] Phone: [phone]   Fax:  [phone]  Physical Therapy Treatment  Patient Details  Name: Cheryl [name] MRN: [id number] Date of Birth: [birth year] Referring Provider: Joni Fears, MD  Encounter Date: 06/21/2016      PT End of Session - 06/21/16 1327    Visit Number 8   Number of Visits 12   Date for PT Re-Evaluation 07/02/16   PT Start Time 1100   PT Stop Time 1200   PT Time Calculation (min) 60 min   Activity Tolerance Patient tolerated treatment well   Behavior During Therapy [facility] for tasks assessed/performed      Past Medical History  Diagnosis Date  . Acid reflux   . Sleep apnea     had surgery to correct  . Sinus drainage   . Arthritis     Past Surgical History  Procedure Laterality Date  . Knee surgery      x2  . Rotator cuff repair      x2  . Trigger finger release    . Carpal tunnel release    . Colonoscopy  02/15/2005    YFV:CBSWH small polyps ablated via cold biopsy, one from transverse colon and two from the rectum/Small external hemorrhoids  . Colonoscopy  07/10/2010    QPR:FFMBWG rectum/long redundant colon, polyps in the sigmoid, descending, hepatic flexure/ADENOMATOUS POLYPS. next TCS due 06/2015  . Esophagogastroduodenoscopy  1995    Gastritis  . Colonoscopy  1995    3 polyps, path revealed chronic colitis  . Esophagogastroduodenoscopy N/A 04/16/2013    RMR: HH  . Uvulopalatopharyngoplasty    . Shoulder arthroscopy with rotator cuff repair and subacromial decompression Right 01/21/2015    Procedure: RIGHT SHOULDER ARTHROSCOPIC DEBRIDEMNT OF G-H JOINT AND REMOVAL OF LOOSE BODIES,ARTHROSCOPIC SUBACROMIAL DECOMPRESSION,MINI OPEN RCT REPAIR WITH SUPPLEMENTAL [facility] PATCH;  Surgeon: Garald Balding, MD;  Location: [place];  Service: Orthopedics;  Laterality: Right;  . Colonoscopy N/A 08/05/2015    Procedure: COLONOSCOPY;  Surgeon: Daneil Dolin,  MD;  [facility];  Service: Endoscopy;  Laterality: N/A;  1000  . Esophagogastroduodenoscopy N/A 05/13/2016    Procedure: ESOPHAGOGASTRODUODENOSCOPY (EGD);  Surgeon: Daneil Dolin, MD;  [facility];  Service: Endoscopy;  Laterality: N/A;  145    There were no vitals filed for this visit.      Subjective Assessment - 06/21/16 1102    Subjective I had tingling in right arm tingling to fingertips. Intermittaint.  Not sure why.  I ma stiff all over today.    Currently in Pain? Yes   Pain Score 6    Pain Location Shoulder   Pain Orientation Right   Pain Descriptors / Indicators Heaviness;Throbbing;Tingling   Pain Radiating Towards to hand    Pain Frequency Constant  arm intermittant   Aggravating Factors  does not know   Pain Relieving Factors does not know   Multiple Pain Sites No            [facility] PT Assessment - 06/21/16 0001    AROM   Cervical Extension 40  shooting pain into RT arm                     [facility] Adult PT Treatment/Exercise - 06/21/16 1105    Self-Care   Self-Care --  discussed dry needle,  pulleys for home have been ordered.   Neck Exercises: Seated   Other Seated  Exercise er 10 X, yellow band  Diagonals 10 x ,  horizontal abduction,  shoulder extension    Shoulder Exercises: Pulleys   Flexion --  5 minutes flexion and abduction.     Moist Heat Therapy   Number Minutes Moist Heat 15 Minutes   Moist Heat Location Cervical  RT shoulder   Electrical Stimulation   Electrical Stimulation Location RTR shoulder and neck   Electrical Stimulation Action IFC   Electrical Stimulation Parameters L15   Electrical Stimulation Goals Pain   Manual Therapy   Soft tissue mobilization soft tissue work neck, shoulder                PT Education - 06/21/16 1327    Education provided Yes   Education Details band shoulder   Person(s) Educated Patient   Methods Explanation;Demonstration;Verbal cues;Handout   Comprehension  Verbalized understanding;Returned demonstration          PT Short Term Goals - 06/21/16 1805    PT SHORT TERM GOAL #1   Title "Independent with initial HEP   Time 3   Period Weeks   Status Achieved   PT SHORT TERM GOAL #2   Title "Report pain decrease from  7 /10 to   4 /10.   Baseline Pain varies not yet 4/10   Time 3   Period Weeks   Status On-going   PT SHORT TERM GOAL #3   Title "Demonstrate and verbalize understanding of condition management including heat, positioning/posture, HEP.    Time 3   Period Weeks   Status Partially Met           PT Long Term Goals - 06/21/16 1806    PT LONG TERM GOAL #1   Title "Pt will be independent with advanced HEP.    Time 6   Period Weeks   Status On-going   PT LONG TERM GOAL #2   Title "Pain will decrease to 1/10 with all functional activities   Baseline varies    Time 6   Period Weeks   Status On-going   PT LONG TERM GOAL #4   Title She will be able to extend neck without shooting pain into RT shoulder or arm   Baseline shooting pain into arm continues   Time 6   Status On-going   PT LONG TERM GOAL #5   Title "FOTO will improve from  45%  to  30%   indicating improved functional mobility .    Time 9   Period Weeks   Status Unable to assess               Plan - 06/21/16 1328    Clinical Impression Statement Progress toward home exercise goals.  Arm tingling reproduced with neck extension.  She is interested in DN. [place] slow.  POC 07/02/2016.   PT Next Visit Plan review bands ,  DN at patient's request when available, work toward goals. FOTO next visit?   PT Home Exercise Plan unattached bands, yellow   Consulted and Agree with Plan of Care Patient      Patient will benefit from skilled therapeutic intervention in order to improve the following deficits and impairments:  Pain, Impaired UE functional use, Decreased activity tolerance, Decreased range of motion, Increased muscle spasms, Postural  dysfunction  Visit Diagnosis: Cervicalgia  Abnormal posture  Cramp and spasm  Decreased strength  Decreased ROM of right shoulder     Problem List Patient Active Problem List   Diagnosis Date  Noted  . Gastric ulceration   . GERD (gastroesophageal reflux disease) 05/11/2016  . Constipation 05/11/2016  . Hashimoto's thyroiditis 02/20/2016  . History of colonic polyps   . Diverticulosis of colon without hemorrhage   . Complete tear of right rotator cuff 01/21/2015  . Severe obesity (BMI >= 40) ([place]) 01/21/2015  . Nausea alone 04/04/2013  . H/O adenomatous polyp of colon 04/04/2013  . Fatty liver 11/11/2010  . RUQ PAIN 11/11/2010  . NONSPEC ELEVATION OF LEVELS OF TRANSAMINASE/LDH 11/11/2010  . COLONIC POLYPS, HYPERPLASTIC, HX OF 06/25/2010  . ANEMIA-NOS 06/24/2010    Arhan Mcmanamon 06/21/2016, 6:09 PM  [facility] [address] [place], [place], [address] Phone: [phone]   Fax:  [phone]  Name: Cheryl [name] MRN: [id number] Date of Birth: 01-Oct-1946    Melvenia Needles, PTA 06/21/2016 6:09 PM Phone: [phone] Fax: [phone]

## 2016-06-21 NOTE — Patient Instructions (Signed)
From exercise drawer.  Scapular unattached.  All issued 1 x a day 10 x each,  1 second hold,  Avoid pain. Yellow band.

## 2016-06-23 ENCOUNTER — Ambulatory Visit: Payer: Medicare Other | Admitting: Physical Therapy

## 2016-06-23 DIAGNOSIS — R531 Weakness: Secondary | ICD-10-CM

## 2016-06-23 DIAGNOSIS — M25611 Stiffness of right shoulder, not elsewhere classified: Secondary | ICD-10-CM

## 2016-06-23 DIAGNOSIS — M542 Cervicalgia: Secondary | ICD-10-CM

## 2016-06-23 DIAGNOSIS — R293 Abnormal posture: Secondary | ICD-10-CM

## 2016-06-23 DIAGNOSIS — R252 Cramp and spasm: Secondary | ICD-10-CM

## 2016-06-23 DIAGNOSIS — M25511 Pain in right shoulder: Secondary | ICD-10-CM | POA: Diagnosis not present

## 2016-06-23 DIAGNOSIS — M6281 Muscle weakness (generalized): Secondary | ICD-10-CM | POA: Diagnosis not present

## 2016-06-23 NOTE — Therapy (Addendum)
[place], [place], [address] Phone: [phone]   Fax:  [phone]  Physical Therapy Treatment  Patient Details  Name: MAJEL GIEL MRN: [id number] Date of Birth: 14-Jul-1946 Referring Provider: Joni Fears, MD  Encounter Date: 06/23/2016      PT End of Session - 06/23/16 1227    Visit Number 9   Number of Visits 12   Date for PT Re-Evaluation 07/02/16   PT Start Time 8466   PT Stop Time 1122   PT Time Calculation (min) 67 min   Activity Tolerance Patient tolerated treatment well   Behavior During Therapy [facility] for tasks assessed/performed      Past Medical History  Diagnosis Date  . Acid reflux   . Sleep apnea     had surgery to correct  . Sinus drainage   . Arthritis     Past Surgical History  Procedure Laterality Date  . Knee surgery      x2  . Rotator cuff repair      x2  . Trigger finger release    . Carpal tunnel release    . Colonoscopy  02/15/2005    ZLD:JTTSV small polyps ablated via cold biopsy, one from transverse colon and two from the rectum/Small external hemorrhoids  . Colonoscopy  07/10/2010    XBL:TJQZES rectum/long redundant colon, polyps in the sigmoid, descending, hepatic flexure/ADENOMATOUS POLYPS. next TCS due 06/2015  . Esophagogastroduodenoscopy  1995    Gastritis  . Colonoscopy  1995    3 polyps, path revealed chronic colitis  . Esophagogastroduodenoscopy N/A 04/16/2013    RMR: HH  . Uvulopalatopharyngoplasty    . Shoulder arthroscopy with rotator cuff repair and subacromial decompression Right 01/21/2015    Procedure: RIGHT SHOULDER ARTHROSCOPIC DEBRIDEMNT OF G-H JOINT AND REMOVAL OF LOOSE BODIES,ARTHROSCOPIC SUBACROMIAL DECOMPRESSION,MINI OPEN RCT REPAIR WITH SUPPLEMENTAL [facility] PATCH;  Surgeon: Garald Balding, MD;  Location: [place];  Service: Orthopedics;  Laterality: Right;  . Colonoscopy N/A 08/05/2015    Procedure: COLONOSCOPY;  Surgeon: Daneil Dolin,  MD;  [facility];  Service: Endoscopy;  Laterality: N/A;  1000  . Esophagogastroduodenoscopy N/A 05/13/2016    Procedure: ESOPHAGOGASTRODUODENOSCOPY (EGD);  Surgeon: Daneil Dolin, MD;  [facility];  Service: Endoscopy;  Laterality: N/A;  145    There were no vitals filed for this visit.      Subjective Assessment - 06/23/16 1017    Subjective Tingling happens more frequently.  Nothing in particular stirs it up.     Currently in Pain? Yes   Pain Score 5   Beetween 4-5   Pain Location Shoulder   Pain Orientation Right                         [facility] Adult PT Treatment/Exercise - 06/23/16 0001    Self-Care   Self-Care Posture;Other Self-Care Comments   Posture How posture can affect pain in arm,  how to take pressure off nerves sometimes with sitting up tall then stretching neck into flexion,  skeleton Model used   Neck Exercises: Seated   Other Seated Exercise bookopener 10 X  Stretch feels good.   Shoulder Exercises: Seated   Extension 10 reps   Theraband Level (Shoulder Extension) Level 1 (Yellow)   Horizontal ABduction 10 reps   Theraband Level (Shoulder Horizontal ABduction) Level 1 (Yellow)   External Rotation 10 reps   Theraband Level (Shoulder External Rotation) Level  1 (Yellow)  both   Other Seated Exercises yellow band with diagonals 10 X each,  cxues for thumb position   Shoulder Exercises: Sidelying   Other Sidelying Exercises book opener for trunk posture stretches. HEP .  1 side only practiced.    Shoulder Exercises: Pulleys   Flexion --  5 minutes flexion/ abduction, scaption   Moist Heat Therapy   Number Minutes Moist Heat 15 Minutes   Moist Heat Location Cervical  and shoulder upper back   Electrical Stimulation   Electrical Stimulation Location RTR shoulder and neck   Electrical Stimulation Action IFC   Electrical Stimulation Parameters L 15   Electrical Stimulation Goals Pain   Manual Therapy   Soft tissue  mobilization soft tissue work neck, shoulder  tisssue softened with manual.     Neck Exercises: Stretches   Other Neck Stretches chin to chest stretch 1 X 30  helped decrease arm tingling.                  PT Education - 06/23/16 1212    Education provided Yes   Education Details bookopener.   Person(s) Educated Patient   Methods Explanation;Demonstration;Verbal cues;Handout   Comprehension Verbalized understanding;Returned demonstration          PT Short Term Goals - 06/21/16 1805    PT SHORT TERM GOAL #1   Title "Independent with initial HEP   Time 3   Period Weeks   Status Achieved   PT SHORT TERM GOAL #2   Title "Report pain decrease from  7 /10 to   4 /10.   Baseline Pain varies not yet 4/10   Time 3   Period Weeks   Status On-going   PT SHORT TERM GOAL #3   Title "Demonstrate and verbalize understanding of condition management including heat, positioning/posture, HEP.    Time 3   Period Weeks   Status Partially Met           PT Long Term Goals - 06/21/16 1806    PT LONG TERM GOAL #1   Title "Pt will be independent with advanced HEP.    Time 6   Period Weeks   Status On-going   PT LONG TERM GOAL #2   Title "Pain will decrease to 1/10 with all functional activities   Baseline varies    Time 6   Period Weeks   Status On-going   PT LONG TERM GOAL #4   Title She will be able to extend neck without shooting pain into RT shoulder or arm   Baseline shooting pain into arm continues   Time 6   Status On-going   PT LONG TERM GOAL #5   Title "FOTO will improve from  45%  to  30%   indicating improved functional mobility .    Time 9   Period Weeks   Status Unable to assess     G-Code:   FOTO  Changing and mauntianing position of body:                    Intake 45% (CK) limited and today FOTO   44% (CK)      Goal : CJ          Plan - 06/23/16 1232    Clinical Impression Statement FOTO limitation 44%, at intake, 55%.  Patient reports tingling  happening more frequently.  It happened 3 X during session today.  Neck flexion stretches helped tingling Some. She had not  done her band exercises,  did not get her bands out.  A new band issued.  No new goals met.    PT Next Visit Plan continue bands , stretch upper traps, neck.  DN next visit.    PT Home Exercise Plan book opener.    Consulted and Agree with Plan of Care Patient      Patient will benefit from skilled therapeutic intervention in order to improve the following deficits and impairments:  Pain, Impaired UE functional use, Decreased activity tolerance, Decreased range of motion, Increased muscle spasms, Postural dysfunction  Visit Diagnosis: Cervicalgia  Abnormal posture  Cramp and spasm  Decreased strength  Decreased ROM of right shoulder     Problem List Patient Active Problem List   Diagnosis Date Noted  . Gastric ulceration   . GERD (gastroesophageal reflux disease) 05/11/2016  . Constipation 05/11/2016  . Hashimoto's thyroiditis 02/20/2016  . History of colonic polyps   . Diverticulosis of colon without hemorrhage   . Complete tear of right rotator cuff 01/21/2015  . Severe obesity (BMI >= 40) ([place]) 01/21/2015  . Nausea alone 04/04/2013  . H/O adenomatous polyp of colon 04/04/2013  . Fatty liver 11/11/2010  . RUQ PAIN 11/11/2010  . NONSPEC ELEVATION OF LEVELS OF TRANSAMINASE/LDH 11/11/2010  . COLONIC POLYPS, HYPERPLASTIC, HX OF 06/25/2010  . ANEMIA-NOS 06/24/2010    HARRIS,KAREN 06/23/2016, 12:41 PM  [facility] [address] [place], [place], [address] Phone: [phone]   Fax:  [phone]  Name: SORCHA ROTUNNO MRN: [id number] Date of Birth: 01/08/1946    Melvenia Needles, PTA 06/23/2016 12:41 PM Phone: [phone] Fax: [phone]

## 2016-06-23 NOTE — Patient Instructions (Signed)
Book opener.  Hand written instructions.  1-2 X a day, 5-10 X each,  5-10 seconds hold.  These should feel good, not painful.

## 2016-06-29 ENCOUNTER — Ambulatory Visit: Payer: Medicare Other | Admitting: Physical Therapy

## 2016-06-29 DIAGNOSIS — R293 Abnormal posture: Secondary | ICD-10-CM | POA: Diagnosis not present

## 2016-06-29 DIAGNOSIS — M25511 Pain in right shoulder: Secondary | ICD-10-CM

## 2016-06-29 DIAGNOSIS — M542 Cervicalgia: Secondary | ICD-10-CM

## 2016-06-29 DIAGNOSIS — R252 Cramp and spasm: Secondary | ICD-10-CM | POA: Diagnosis not present

## 2016-06-29 DIAGNOSIS — M6281 Muscle weakness (generalized): Secondary | ICD-10-CM | POA: Diagnosis not present

## 2016-06-29 NOTE — Therapy (Signed)
[place], [place], [address] Phone: [phone]   Fax:  [phone]  Physical Therapy Treatment  Patient Details  Name: Cheryl [name] MRN: [id number] Date of Birth: 09-06-1946 Referring Provider: Joni Fears, MD  Encounter Date: 06/29/2016      PT End of Session - 06/29/16 1111    Visit Number 10   Number of Visits 12   Date for PT Re-Evaluation 07/02/16   Authorization Type Medicare    Authorization Time Period KX at visit 15   PT Start Time 1105   PT Stop Time 1200   PT Time Calculation (min) 55 min   Activity Tolerance Patient tolerated treatment well   Behavior During Therapy [facility] for tasks assessed/performed      Past Medical History  Diagnosis Date  . Acid reflux   . Sleep apnea     had surgery to correct  . Sinus drainage   . Arthritis     Past Surgical History  Procedure Laterality Date  . Knee surgery      x2  . Rotator cuff repair      x2  . Trigger finger release    . Carpal tunnel release    . Colonoscopy  02/15/2005    KGY:JEHUD small polyps ablated via cold biopsy, one from transverse colon and two from the rectum/Small external hemorrhoids  . Colonoscopy  07/10/2010    JSH:FWYOVZ rectum/long redundant colon, polyps in the sigmoid, descending, hepatic flexure/ADENOMATOUS POLYPS. next TCS due 06/2015  . Esophagogastroduodenoscopy  1995    Gastritis  . Colonoscopy  1995    3 polyps, path revealed chronic colitis  . Esophagogastroduodenoscopy N/A 04/16/2013    RMR: HH  . Uvulopalatopharyngoplasty    . Shoulder arthroscopy with rotator cuff repair and subacromial decompression Right 01/21/2015    Procedure: RIGHT SHOULDER ARTHROSCOPIC DEBRIDEMNT OF G-H JOINT AND REMOVAL OF LOOSE BODIES,ARTHROSCOPIC SUBACROMIAL DECOMPRESSION,MINI OPEN RCT REPAIR WITH SUPPLEMENTAL [facility] PATCH;  Surgeon: Garald Balding, MD;  Location: [place];  Service: Orthopedics;  Laterality: Right;  .  Colonoscopy N/A 08/05/2015    Procedure: COLONOSCOPY;  Surgeon: Daneil Dolin, MD;  [facility];  Service: Endoscopy;  Laterality: N/A;  1000  . Esophagogastroduodenoscopy N/A 05/13/2016    Procedure: ESOPHAGOGASTRODUODENOSCOPY (EGD);  Surgeon: Daneil Dolin, MD;  [facility];  Service: Endoscopy;  Laterality: N/A;  145    There were no vitals filed for this visit.      Subjective Assessment - 06/29/16 1112    Subjective tingling still happening more frequently.  I am doing all my exercise that I can and my tingling really bothers my  fingers.  More C-6/7 distribution   Limitations Lifting  uses left arm to assist right   How long can you stand comfortably? Limited by back probelms   How long can you walk comfortably? Limited by back problems   Diagnostic tests Xrays: OA DDD   Patient Stated Goals To releive pain and get use of arm and get use of arm back   Currently in Pain? Yes   Pain Score 4    Pain Location Shoulder  upper trap   Pain Orientation Right   Pain Descriptors / Indicators Heaviness;Throbbing;Tingling  tingling getting worse   Pain Type Chronic pain   Pain Onset More than a month ago            [facility] PT Assessment - 06/29/16 1122    Observation/Other Assessments  Focus on Therapeutic Outcomes (FOTO)  FOTO 63 % limitation  44%, predicted 37%                     [facility] Adult PT Treatment/Exercise - 06/29/16 1122    Posture/Postural Control   Posture/Postural Control Postural limitations   Postural Limitations Rounded Shoulders;Forward head   Posture Comments Pt has bifocals and sits with hyperextended neck.  Pt educated on work Market researcher Work Passenger transport manager Therapy   Number Minutes Moist Heat 15 Minutes   Moist Heat Location Cervical  and shoulder upper back   Manual Therapy   Manual Therapy Soft tissue mobilization   Manual therapy  comments Right scalene and SCM, cervical distraction in supine   Soft tissue mobilization soft tissue work neck, shoulder right scalene, SCM, supraspnatus, sub scapularis,   tisssue softened with manual.     Neck Exercises: Stretches   Upper Trapezius Stretch 2 reps;30 seconds  right side   Levator Stretch 2 reps;30 seconds  right side   Neck Stretch 2 reps;30 seconds  scalene          Trigger Point Dry Needling - 06/29/16 1123    Consent Given? Yes   Education Handout Provided Yes   Muscles Treated Upper Body Upper trapezius;Levator right side only for all dry needling scapulae;Supraspinatus;Subscapularis  right only C 5-6 Right erector spinae   Upper Trapezius Response Twitch reponse elicited;Palpable increased muscle length   Levator Scapulae Response Palpable increased muscle length   Supraspinatus Response Twitch response elicited;Palpable increased muscle length   Subscapularis Response Palpable increased muscle length              PT Education - 06/29/16 1203    Education provided Yes   Education Details trigger point dry needling and posture work Scientist, water quality and review of upper trap, levator and scalene stretch.   Person(s) Educated Patient   Methods Explanation;Demonstration;Tactile cues;Handout   Comprehension Verbalized understanding;Returned demonstration          PT Short Term Goals - 06/21/16 1805    PT SHORT TERM GOAL #1   Title "Independent with initial HEP   Time 3   Period Weeks   Status Achieved   PT SHORT TERM GOAL #2   Title "Report pain decrease from  7 /10 to   4 /10.   Baseline Pain varies not yet 4/10   Time 3   Period Weeks   Status On-going   PT SHORT TERM GOAL #3   Title "Demonstrate and verbalize understanding of condition management including heat, positioning/posture, HEP.    Time 3   Period Weeks   Status Partially Met           PT Long Term Goals - 06/21/16 1806    PT LONG TERM GOAL #1   Title "Pt will be  independent with advanced HEP.    Time 6   Period Weeks   Status On-going   PT LONG TERM GOAL #2   Title "Pain will decrease to 1/10 with all functional activities   Baseline varies    Time 6   Period Weeks   Status On-going   PT LONG TERM GOAL #4   Title She will be able to extend neck without shooting pain into RT shoulder or arm   Baseline shooting pain into arm continues   Time 6   Status On-going  PT LONG TERM GOAL #5   Title "FOTO will improve from  45%  to  30%   indicating improved functional mobility .    Time 9   Period Weeks   Status Unable to assess               Plan - July 03, 2016 1204    Clinical Impression Statement Ms. Eisenstein still complains of tingling in entire right hand but mostly C -6/7 distribution.  Pt consented to trigger point dry needling and recieved handout for aftercare and precautions.  Pt had twitch response of Right scalenes and upper trap and supraspinatus.   Pt  received education on work station ergonmics to reduce hyperextension of neck as she utilizes her bifocals to see.. Pt  has 2 more visits remaining .  Will assess need to retrurn to MD if  tingling sensations remain. FOTO improvement from eval 44% from 45%  . only 1 % change   Rehab Potential Good   PT Frequency 2x / week   PT Duration 6 weeks   PT Treatment/Interventions Cryotherapy;Electrical Stimulation;Iontophoresis 92m/ml Dexamethasone;Moist Heat;Traction;Ultrasound;Therapeutic exercise;Patient/family education;Manual techniques;Passive range of motion;Taping;Dry needling   PT Next Visit Plan continue bands , stretch upper traps, neck.  assess DN, review ergonomics    PT Home Exercise Plan upper trap, levator and scalene stretch.  Work station eProgrammer, systemswith Plan of Care Patient      Patient will benefit from skilled therapeutic intervention in order to improve the following deficits and impairments:  Pain, Impaired UE functional use, Decreased activity  tolerance, Decreased range of motion, Increased muscle spasms, Postural dysfunction  Visit Diagnosis: Cervicalgia  Abnormal posture  Pain in joint, shoulder region, right       G-Codes - 02017-07-151February 28, 1219   Functional Assessment Tool Used FOTO  44%   Functional Limitation Changing and maintaining body position   Changing and Maintaining Body Position Current Status ((R0076 At least 40 percent but less than 60 percent impaired, limited or restricted   Changing and Maintaining Body Position Goal Status ((A2633 At least 20 percent but less than 40 percent impaired, limited or restricted      Problem List Patient Active Problem List   Diagnosis Date Noted  . Gastric ulceration   . GERD (gastroesophageal reflux disease) 05/11/2016  . Constipation 05/11/2016  . Hashimoto's thyroiditis 02/20/2016  . History of colonic polyps   . Diverticulosis of colon without hemorrhage   . Complete tear of right rotator cuff 01/21/2015  . [place] ([place] >= 40[place] 01/21/[place]  . Nausea alone 04/04/2013  . H/O adenomatous polyp of colon 04/04/2013  . Fatty liver 11/11/2010  . RUQ PAIN 11/11/2010  . NONSPEC ELEVATION OF LEVELS OF TRANSAMINASE/LDH 11/11/2010  . COLONIC POLYPS, HYPERPLASTIC, HX OF 06/25/2010  . ANEMIA-NOS 06/24/2010   LVoncille Lo PT 02017/06/1511:22 PM Phone: [phone]: [place] [address]Phone: [phone]  Fax:  [phone] Name: USAMINA WEEKESMRN: [id number] of Birth: [birth year]

## 2016-06-29 NOTE — Patient Instructions (Signed)
Trigger Point Dry Needling  . What is Trigger Point Dry Needling (DN)? o DN is a physical therapy technique used to treat muscle pain and dysfunction. Specifically, DN helps deactivate muscle trigger points (muscle knots).  o A thin filiform needle is used to penetrate the skin and stimulate the underlying trigger point. The goal is for a local twitch response (LTR) to occur and for the trigger point to relax. No medication of any kind is injected during the procedure.   . What Does Trigger Point Dry Needling Feel Like?  o The procedure feels different for each individual patient. Some patients report that they do not actually feel the needle enter the skin and overall the process is not painful. Very mild bleeding may occur. However, many patients feel a deep cramping in the muscle in which the needle was inserted. This is the local twitch response.   Marland Kitchen How Will I feel after the treatment? o Soreness is normal, and the onset of soreness may not occur for a few hours. Typically this soreness does not last longer than two days.  o Bruising is uncommon, however; ice can be used to decrease any possible bruising.  o In rare cases feeling tired or nauseous after the treatment is normal. In addition, your symptoms may get worse before they get better, this period will typically not last longer than 24 hours.   . What Can I do After My Treatment? o Increase your hydration by drinking more water for the next 24 hours. o You may place ice or heat on the areas treated that have become sore, however, do not use heat on inflamed or bruised areas. Heat often brings more relief post needling. o You can continue your regular activities, but vigorous activity is not recommended initially after the treatment for 24 hours. o DN is best combined with other physical therapy such as strengthening, stretching, and other therapies.  Voncille Lo, PT 06/29/2016 11:22 AM Phone: [phone] Fax: [phone]

## 2016-07-05 ENCOUNTER — Ambulatory Visit: Payer: Medicare Other

## 2016-07-05 DIAGNOSIS — M542 Cervicalgia: Secondary | ICD-10-CM | POA: Diagnosis not present

## 2016-07-05 DIAGNOSIS — R252 Cramp and spasm: Secondary | ICD-10-CM | POA: Diagnosis not present

## 2016-07-05 DIAGNOSIS — R293 Abnormal posture: Secondary | ICD-10-CM | POA: Diagnosis not present

## 2016-07-05 DIAGNOSIS — M25511 Pain in right shoulder: Secondary | ICD-10-CM | POA: Diagnosis not present

## 2016-07-05 DIAGNOSIS — M6281 Muscle weakness (generalized): Secondary | ICD-10-CM | POA: Diagnosis not present

## 2016-07-05 NOTE — Therapy (Addendum)
[place], [place], [address] Phone: [phone]   Fax:  [phone]  Physical Therapy Treatment/ Discharge  Patient Details  Name: Cheryl [name] MRN: [id number] Date of Birth: [birth year] Referring Provider: Joni Fears, MD  Encounter Date: 07/05/2016      PT End of Session - 07/05/16 1231    Visit Number 11   Number of Visits 16   Date for PT Re-Evaluation 07/23/16   Authorization Type Medicare    Authorization Time Period KX at visit 15   PT Start Time 1226   PT Stop Time 1322   PT Time Calculation (min) 56 min   Activity Tolerance Patient tolerated treatment well   Behavior During Therapy [facility] for tasks assessed/performed      Past Medical History  Diagnosis Date  . Acid reflux   . Sleep apnea     had surgery to correct  . Sinus drainage   . Arthritis     Past Surgical History  Procedure Laterality Date  . Knee surgery      x2  . Rotator cuff repair      x2  . Trigger finger release    . Carpal tunnel release    . Colonoscopy  02/15/2005    [id number] small polyps ablated via cold biopsy, one from transverse colon and two from the rectum/Small external hemorrhoids  . Colonoscopy  07/10/2010    [id number] rectum/long redundant colon, polyps in the sigmoid, descending, hepatic flexure/ADENOMATOUS POLYPS. next TCS due 06/2015  . Esophagogastroduodenoscopy  1995    Gastritis  . Colonoscopy  1995    3 polyps, path revealed chronic colitis  . Esophagogastroduodenoscopy N/A 04/16/2013    RMR: HH  . Uvulopalatopharyngoplasty    . Shoulder arthroscopy with rotator cuff repair and subacromial decompression Right 01/21/2015    Procedure: RIGHT SHOULDER ARTHROSCOPIC DEBRIDEMNT OF G-H JOINT AND REMOVAL OF LOOSE BODIES,ARTHROSCOPIC SUBACROMIAL DECOMPRESSION,MINI OPEN RCT REPAIR WITH SUPPLEMENTAL [facility] PATCH;  Surgeon: Garald Balding, MD;  Location: [place];  Service: Orthopedics;  Laterality:  Right;  . Colonoscopy N/A 08/05/2015    Procedure: COLONOSCOPY;  Surgeon: Daneil Dolin, MD;  [facility];  Service: Endoscopy;  Laterality: N/A;  1000  . Esophagogastroduodenoscopy N/A 05/13/2016    Procedure: ESOPHAGOGASTRODUODENOSCOPY (EGD);  Surgeon: Daneil Dolin, MD;  [facility];  Service: Endoscopy;  Laterality: N/A;  145    There were no vitals filed for this visit.      Subjective Assessment - 07/05/16 1232    Subjective Tingling worse now. [name] was 2x/day, now 4-5x/day or more . nothing specific.    Currently in Pain? No/denies   Multiple Pain Sites No            [facility] PT Assessment - 07/05/16 0001    AROM   Cervical Flexion 30   Cervical Extension 34  pull anterior RT deltoid and upper arm   Cervical - Right Side Bend 40   Cervical - Left Side Bend 40   Cervical - Right Rotation 75   Cervical - Left Rotation 73   Strength   Overall Strength Comments WNL both shoulders                      [facility] Adult PT Treatment/Exercise - 07/05/16 0001    Shoulder Exercises: Seated   Extension Both  12   Theraband Level (Shoulder Extension) Level 2 (Red)   Row Both;12 reps  Theraband Level (Shoulder Row) Level 2 (Red)   Horizontal ABduction Both;12 reps   Theraband Level (Shoulder Horizontal ABduction) Level 2 (Red)   External Rotation Both;12 reps   Theraband Level (Shoulder External Rotation) Level 2 (Red)   Other Seated Exercises Diagonals x 12 red band      Reviewed stretches for home and red band issued for home.   Dis cussed progress with pain and exercise and lack of progress with symptoms into her RT arm          PT Short Term Goals - 07/05/16 1324    PT SHORT TERM GOAL #1   Title "Independent with initial HEP   Status Achieved   PT SHORT TERM GOAL #2   Title "Report pain decrease from  7 /10 to   4 /10.   Status Achieved   PT SHORT TERM GOAL #3   Title "Demonstrate and verbalize understanding of condition  management including heat, positioning/posture, HEP.    Status Achieved           PT Long Term Goals - 07/05/16 1325    PT LONG TERM GOAL #1   Title "Pt will be independent with advanced HEP.    Status Achieved   PT LONG TERM GOAL #2   Title "Pain will decrease to 1/10 with all functional activities   Status Partially Met   PT LONG TERM GOAL #3   Title "R shoulder AROM scaption will improve to 0-120  degrees for improved overhead reaching to reach dishes and retrieve items from freezer.   Status Achieved   PT LONG TERM GOAL #4   Title She will be able to extend neck without shooting pain into RT shoulder or arm   Status On-going   PT LONG TERM GOAL #5   Title "FOTO will improve from  45%  to  30%   indicating improved functional mobility .    Status On-going               Plan - 07/05/16 1232    Clinical Impression Statement Continues with tingle into RT arm. Not improving. Tingle more into Rt hand but also into RT anterior arm. DN helped with pain but not tingle. She reports  less pain since DN and feels looser.   She may benefit from more DN as other interventions have not made any significant improvement. in tingling into her RT arm. She agreed to follow up with Dr Durward Fortes.  I will put her on hold until after MD visit. If Dr Mamie Nick want more sessions will do 6 and assess improvement and need to continue PT   PT Frequency 2x / week   PT Duration 3 weeks   PT Treatment/Interventions Cryotherapy;Electrical Stimulation;Iontophoresis 60m/ml Dexamethasone;Moist Heat;Traction;Ultrasound;Therapeutic exercise;Patient/family education;Manual techniques;Passive range of motion;Taping;Dry needling   PT Next Visit Plan continue bands , stretch upper traps, neck.  review ergonomics    Consulted and Agree with Plan of Care Patient      Patient will benefit from skilled therapeutic intervention in order to improve the following deficits and impairments:  Pain, Impaired UE  functional use, Decreased activity tolerance, Decreased range of motion, Increased muscle spasms, Postural dysfunction  Visit Diagnosis: Cervicalgia  Abnormal posture  Pain in joint, shoulder region, right  Cramp and spasm     Problem List Patient Active Problem List   Diagnosis Date Noted  . Gastric ulceration   . GERD (gastroesophageal reflux disease) 05/11/2016  . Constipation 05/11/2016  . Hashimoto's thyroiditis  02/20/2016  . History of colonic polyps   . Diverticulosis of colon without hemorrhage   . Complete tear of right rotator cuff 01/21/2015  . Severe obesity (BMI >= 40) ([place]) 01/21/2015  . Nausea alone 04/04/2013  . H/O adenomatous polyp of colon 04/04/2013  . Fatty liver 11/11/2010  . RUQ PAIN 11/11/2010  . NONSPEC ELEVATION OF LEVELS OF TRANSAMINASE/LDH 11/11/2010  . COLONIC POLYPS, HYPERPLASTIC, HX OF 06/25/2010  . ANEMIA-NOS 06/24/2010    Darrel Hoover  PT 07/05/2016, 1:27 PM  [place] [address] [place], [place], [address] Phone: [phone]   Fax:  [phone]  Name: REMMI ARMENTEROS MRN: [id number] Date of Birth: April 11, 1946    PHYSICAL THERAPY DISCHARGE SUMMARY  Visits from Start of Care: 11  Current functional level related to goals / functional outcomes: She did not return after this visit as she was to see Dr Durward Fortes for follow up   Remaining deficits: Unknown   Education / Equipment: HEP  Plan: Patient agrees to discharge.  Patient goals were partially met. Patient is being discharged due to not returning since the last visit.  ?????   Lillette Boxer Elliett Guarisco  PT   08/26/16   1:55 PM

## 2016-07-06 ENCOUNTER — Encounter: Payer: Medicare Other | Admitting: Physical Therapy

## 2016-07-07 DIAGNOSIS — J209 Acute bronchitis, unspecified: Secondary | ICD-10-CM | POA: Diagnosis not present

## 2016-07-07 DIAGNOSIS — Z6841 Body Mass Index (BMI) 40.0 and over, adult: Secondary | ICD-10-CM | POA: Diagnosis not present

## 2016-07-07 DIAGNOSIS — Z1389 Encounter for screening for other disorder: Secondary | ICD-10-CM | POA: Diagnosis not present

## 2016-07-07 DIAGNOSIS — J069 Acute upper respiratory infection, unspecified: Secondary | ICD-10-CM | POA: Diagnosis not present

## 2016-07-08 ENCOUNTER — Encounter: Payer: Medicare Other | Admitting: Physical Therapy

## 2016-07-12 DIAGNOSIS — M5412 Radiculopathy, cervical region: Secondary | ICD-10-CM | POA: Diagnosis not present

## 2016-07-12 DIAGNOSIS — M47812 Spondylosis without myelopathy or radiculopathy, cervical region: Secondary | ICD-10-CM | POA: Diagnosis not present

## 2016-07-12 DIAGNOSIS — M542 Cervicalgia: Secondary | ICD-10-CM | POA: Diagnosis not present

## 2016-07-14 ENCOUNTER — Other Ambulatory Visit: Payer: Self-pay | Admitting: Orthopaedic Surgery

## 2016-07-14 DIAGNOSIS — M542 Cervicalgia: Secondary | ICD-10-CM

## 2016-07-23 ENCOUNTER — Other Ambulatory Visit: Payer: Medicare Other

## 2016-07-23 ENCOUNTER — Ambulatory Visit
Admission: RE | Admit: 2016-07-23 | Discharge: 2016-07-23 | Disposition: A | Discharge status: Home / Self Care | Payer: Medicare Other | Source: Ambulatory Visit | Attending: Orthopaedic Surgery | Admitting: Orthopaedic Surgery

## 2016-07-23 DIAGNOSIS — M542 Cervicalgia: Secondary | ICD-10-CM

## 2016-07-23 DIAGNOSIS — M4802 Spinal stenosis, cervical region: Secondary | ICD-10-CM | POA: Diagnosis not present

## 2016-07-29 DIAGNOSIS — M47812 Spondylosis without myelopathy or radiculopathy, cervical region: Secondary | ICD-10-CM | POA: Diagnosis not present

## 2016-08-03 DIAGNOSIS — M5010 Cervical disc disorder with radiculopathy, unspecified cervical region: Secondary | ICD-10-CM | POA: Diagnosis not present

## 2016-08-03 DIAGNOSIS — M5412 Radiculopathy, cervical region: Secondary | ICD-10-CM | POA: Diagnosis not present

## 2016-08-03 DIAGNOSIS — M47816 Spondylosis without myelopathy or radiculopathy, lumbar region: Secondary | ICD-10-CM | POA: Diagnosis not present

## 2016-08-11 ENCOUNTER — Other Ambulatory Visit: Payer: Self-pay

## 2016-08-11 ENCOUNTER — Encounter: Payer: Self-pay | Admitting: Nurse Practitioner

## 2016-08-11 ENCOUNTER — Ambulatory Visit (INDEPENDENT_AMBULATORY_CARE_PROVIDER_SITE_OTHER): Payer: Medicare Other | Admitting: Nurse Practitioner

## 2016-08-11 VITALS — BP 140/85 | HR 84 | Temp 97.6°F | Ht 59.0 in | Wt 246.0 lb

## 2016-08-11 DIAGNOSIS — K259 Gastric ulcer, unspecified as acute or chronic, without hemorrhage or perforation: Secondary | ICD-10-CM

## 2016-08-11 DIAGNOSIS — K2960 Other gastritis without bleeding: Secondary | ICD-10-CM

## 2016-08-11 DIAGNOSIS — K5900 Constipation, unspecified: Secondary | ICD-10-CM

## 2016-08-11 DIAGNOSIS — M5010 Cervical disc disorder with radiculopathy, unspecified cervical region: Secondary | ICD-10-CM | POA: Diagnosis not present

## 2016-08-11 DIAGNOSIS — M5412 Radiculopathy, cervical region: Secondary | ICD-10-CM | POA: Diagnosis not present

## 2016-08-11 NOTE — Assessment & Plan Note (Addendum)
Noted multiple erosions status post biopsy. Recommended 3 month repeat endoscopy for surveillance. Clinically she is significantly improved on twice a day dosing of Protonix and I expect that her mucosal lining will be significantly improved as well. GERD symptoms very well controlled. Return for follow-up in 6 months. EGD as recommended.  Proceed with EGD with Dr. Gala Romney in near future: the risks, benefits, and alternatives have been discussed with the patient in detail. The patient states understanding and desires to proceed.  The patient is not on any anticoagulants or anxiolytics. Was on a brief course of Norco consisting of 6 tablets with no refills. No other chronic pain medications. Her previous procedure was completed with conscious sedation without issue and conscious sedation should be adequate for this procedure as well.

## 2016-08-11 NOTE — Assessment & Plan Note (Signed)
Symptoms essentially resolved on Linzess 145 g once daily. Recommend she continue this regimen, return for follow-up in 6 months. Call if any problems before then.

## 2016-08-11 NOTE — Progress Notes (Signed)
cc'ed to pcp °

## 2016-08-11 NOTE — Progress Notes (Signed)
Referring Provider: Sharilyn Sites, MD Primary Care Physician:  Purvis Kilts, MD Primary GI:  Dr. Gala Romney  Chief Complaint  [name] is a 70 y.o. female who presents for follow-up on GERD and constipation. The patient was last seen in our office 05/11/2016 at which point she was having worsening constipation, takes daily stool softeners with one to 2 bowel movements today although sensation of incomplete emptying. She started on Linzess 75 mg and requested progress report in 2 weeks. Also noted worsening of previously well-controlled GERD on Protonix. Protonix increased to twice a day which is helped some but not resolved her symptoms. Takes NSAIDs about twice a month but had been taking it more recently in the previous few weeks due to arthritis pain. She was scheduled for upper endoscopy and recommended follow-up in 3 months.  She called back saying Linzess 72 g initially effective but had lost effectiveness. She was started on 145 g dose was worked effectively and a prescription was sent into her pharmacy.  EGD completed 05/13/2016 and found normal esophagus, multiple 1-2 mm antral erosions, multiple 3 mm erosions as well appearing to be benign, small hiatal hernia, patent pylorus, normal-appearing duodenum. Gastric ulcer biopsies were taken and symptoms deemed likely due to erosive gastropathy. Surgical pathology of the stomach biopsies found gastric mucosa with reactive and regenerative changes without H. pylori. Recommended 3 month repeat EGD for surveillance.  Today she states her constipation is resolved, has regular bowel movements which are consistent with [facility] 4, no straining. Linzess 145 mcg working very well. GERD improved on PPI twice a day. Deneis abdominal pain, N/V, dysphagia, odynophagia. Denies hematochezia, melena. Deneis fever, chills, unintentional weight loss. She has managed to lose about 4 pounds  intentionally. Denies chest pain, dyspnea, dizziness, lightheadedness, syncope, near syncope. Denies any other upper or lower GI symptoms.  Past Medical History:  Diagnosis Date  . Acid reflux   . Arthritis   . Sinus drainage   . Sleep apnea    had surgery to correct    Past Surgical History:  Procedure Laterality Date  . CARPAL TUNNEL RELEASE    . COLONOSCOPY  02/15/2005   [id number] small polyps ablated via cold biopsy, one from transverse colon and two from the rectum/Small external hemorrhoids  . COLONOSCOPY  07/10/2010   [id number] rectum/long redundant colon, polyps in the sigmoid, descending, hepatic flexure/ADENOMATOUS POLYPS. next TCS due 06/2015  . COLONOSCOPY  1995   3 polyps, path revealed chronic colitis  . COLONOSCOPY N/A 08/05/2015   Procedure: COLONOSCOPY;  Surgeon: Daneil Dolin, MD;  [facility];  Service: Endoscopy;  Laterality: N/A;  1000  . ESOPHAGOGASTRODUODENOSCOPY  1995   Gastritis  . ESOPHAGOGASTRODUODENOSCOPY N/A 04/16/2013   RMR: HH  . ESOPHAGOGASTRODUODENOSCOPY N/A 05/13/2016   Procedure: ESOPHAGOGASTRODUODENOSCOPY (EGD);  Surgeon: Daneil Dolin, MD;  [facility];  Service: Endoscopy;  Laterality: N/A;  145  . KNEE SURGERY     x2  . ROTATOR CUFF REPAIR     x2  . SHOULDER ARTHROSCOPY WITH ROTATOR CUFF REPAIR AND SUBACROMIAL DECOMPRESSION Right 01/21/2015   Procedure: RIGHT SHOULDER ARTHROSCOPIC DEBRIDEMNT OF G-H JOINT AND REMOVAL OF LOOSE BODIES,ARTHROSCOPIC SUBACROMIAL DECOMPRESSION,MINI OPEN RCT REPAIR WITH SUPPLEMENTAL [facility] PATCH;  Surgeon: Garald Balding, MD;  Location: [place];  Service: Orthopedics;  Laterality: Right;  . TRIGGER FINGER RELEASE    . UVULOPALATOPHARYNGOPLASTY  Current Outpatient Prescriptions  Medication Sig Dispense Refill  . albuterol (PROVENTIL HFA;VENTOLIN HFA) 108 (90 BASE) MCG/ACT inhaler Inhale 1 puff into the lungs every 6 (six) hours as needed for wheezing or shortness of breath.    .  allopurinol (ZYLOPRIM) 100 MG tablet Take 100 mg by mouth 3 (three) times daily with meals.    . Biotin 1000 MCG tablet Take 1,000 mcg by mouth every morning.     Marland Kitchen CALCIUM-VITAMIN D PO Take 1 tablet by mouth every morning.    . Cyanocobalamin (VITAMIN B-12 PO) Take 1 tablet by mouth every morning.     . febuxostat (ULORIC) 40 MG tablet Take 40 mg by mouth daily.    . fluticasone (FLONASE) 50 MCG/ACT nasal spray Place 1 spray into both nostrils daily as needed for allergies (sinuses).    Marland Kitchen HYDROcodone-acetaminophen (NORCO/VICODIN) 5-325 MG tablet Take 1 tablet by mouth every 6 (six) hours as needed. 6 tablet 0  . linaclotide (LINZESS) 145 MCG CAPS capsule Take 1 capsule (145 mcg total) by mouth daily before breakfast. 30 capsule 11  . montelukast (SINGULAIR) 10 MG tablet Take 10 mg by mouth every morning.    . pantoprazole (PROTONIX) 40 MG tablet Take 40 mg by mouth 2 (two) times daily before a meal.     . raloxifene (EVISTA) 60 MG tablet Take 60 mg by mouth daily.    Marland Kitchen tolterodine (DETROL) 1 MG tablet Take 1 mg by mouth 2 (two) times daily.     No current facility-administered medications for this visit.     Allergies as of 08/11/2016 - Review Complete 08/11/2016  Allergen Reaction Noted  . Prednisone Other (See Comments) 04/04/2013  . Tramadol Nausea Only 01/21/2015  . Aspirin Nausea And Vomiting   . Celecoxib Rash     Family History  Problem Relation Age of Onset  . Cancer Mother   . Cancer Father   . Diabetes Sister   . Colon cancer Neg Hx   . Liver disease Neg Hx     Social History   Social History  . Marital status: Married    Spouse name: N/A  . Number of children: 2  . Years of education: N/A   Occupational History  . [place]   Social History Main Topics  . Smoking status: Former Smoker    Years: 48.00    Types: Cigarettes    Quit date: 05/11/2005  . Smokeless tobacco: Never Used  . Alcohol use No  . Drug use: No  . Sexual activity:  Not Asked   Other Topics Concern  . None   Social History Narrative  . None    Review of Systems: General: Negative for anorexia, weight loss, fever, chills, fatigue, weakness. ENT: Negative for hoarseness, difficulty swallowing. CV: Negative for chest pain, angina, palpitations, peripheral edema.  Respiratory: Negative for dyspnea at rest, cough, sputum, wheezing.  GI: See history of present illness. Endo: Negative for unusual weight change.  Heme: Negative for bruising or bleeding.  Physical Exam: BP 140/85   Pulse 84   Temp 97.6 F (36.4 C) (Oral)   Ht 4\' 11"  (1.499 m)   Wt 246 lb (111.6 kg)   BMI 49.69 kg/m  General:   Morbidly obese female. Alert and oriented. Pleasant and cooperative. Well-nourished and well-developed.  Ears:  Normal auditory acuity. Cardiovascular:  S1, S2 present without murmurs appreciated. Extremities without clubbing or edema. Respiratory:  Clear to auscultation bilaterally. No wheezes, rales, or rhonchi.  No distress.  Gastrointestinal:  +BS, obese but soft, non-tender and non-distended. No HSM noted. No guarding or rebound. No masses appreciated.  Rectal:  Deferred  Musculoskalatal:  Symmetrical without gross deformities. Neurologic:  Alert and oriented x4;  grossly normal neurologically. Psych:  Alert and cooperative. Normal mood and affect. Heme/Lymph/Immune:  No excessive bruising noted.    08/11/2016 10:56 AM   Disclaimer: This note was dictated with voice recognition software. Similar sounding words can inadvertently be transcribed and may not be corrected upon review.

## 2016-08-11 NOTE — Patient Instructions (Signed)
1. We will schedule your procedure for you. 2. Continue taking Linzess to help with her bowel movements. 3. Continue taking Protonix twice a day as you have been. 4. Return for follow-up in 6 months.

## 2016-08-18 ENCOUNTER — Ambulatory Visit (HOSPITAL_COMMUNITY)
Admission: RE | Admit: 2016-08-18 | Discharge: 2016-08-18 | Disposition: A | Discharge status: Home / Self Care | Payer: Medicare Other | Source: Ambulatory Visit | Attending: Internal Medicine | Admitting: Internal Medicine

## 2016-08-18 ENCOUNTER — Encounter (HOSPITAL_COMMUNITY): Payer: Self-pay | Admitting: *Deleted

## 2016-08-18 ENCOUNTER — Encounter (HOSPITAL_COMMUNITY)
Admission: RE | Disposition: A | Discharge status: Home / Self Care | Payer: Self-pay | Source: Ambulatory Visit | Attending: Internal Medicine

## 2016-08-18 DIAGNOSIS — K2900 Acute gastritis without bleeding: Secondary | ICD-10-CM | POA: Diagnosis not present

## 2016-08-18 DIAGNOSIS — Z87891 Personal history of nicotine dependence: Secondary | ICD-10-CM | POA: Diagnosis not present

## 2016-08-18 DIAGNOSIS — K219 Gastro-esophageal reflux disease without esophagitis: Secondary | ICD-10-CM | POA: Diagnosis not present

## 2016-08-18 DIAGNOSIS — M1990 Unspecified osteoarthritis, unspecified site: Secondary | ICD-10-CM | POA: Insufficient documentation

## 2016-08-18 DIAGNOSIS — Z8719 Personal history of other diseases of the digestive system: Secondary | ICD-10-CM | POA: Diagnosis not present

## 2016-08-18 DIAGNOSIS — Z8711 Personal history of peptic ulcer disease: Secondary | ICD-10-CM | POA: Diagnosis not present

## 2016-08-18 DIAGNOSIS — Z79899 Other long term (current) drug therapy: Secondary | ICD-10-CM | POA: Insufficient documentation

## 2016-08-18 DIAGNOSIS — K449 Diaphragmatic hernia without obstruction or gangrene: Secondary | ICD-10-CM | POA: Diagnosis not present

## 2016-08-18 DIAGNOSIS — Z13818 Encounter for screening for other digestive system disorders: Secondary | ICD-10-CM | POA: Insufficient documentation

## 2016-08-18 DIAGNOSIS — K5900 Constipation, unspecified: Secondary | ICD-10-CM | POA: Insufficient documentation

## 2016-08-18 DIAGNOSIS — K2960 Other gastritis without bleeding: Secondary | ICD-10-CM

## 2016-08-18 HISTORY — PX: ESOPHAGOGASTRODUODENOSCOPY: SHX5428

## 2016-08-18 SURGERY — EGD (ESOPHAGOGASTRODUODENOSCOPY)
Anesthesia: Moderate Sedation

## 2016-08-18 MED ORDER — SIMETHICONE 40 MG/0.6ML PO SUSP
ORAL | Status: AC
  Filled 2016-08-18: qty 30

## 2016-08-18 MED ORDER — MIDAZOLAM HCL 5 MG/5ML IJ SOLN
INTRAMUSCULAR | Status: DC | PRN
  Administered 2016-08-18 (×2): 2 mg via INTRAVENOUS

## 2016-08-18 MED ORDER — MIDAZOLAM HCL 5 MG/5ML IJ SOLN
INTRAMUSCULAR | Status: AC
  Filled 2016-08-18: qty 10

## 2016-08-18 MED ORDER — LIDOCAINE VISCOUS 2 % MT SOLN
OROMUCOSAL | Status: AC
  Filled 2016-08-18: qty 15

## 2016-08-18 MED ORDER — ONDANSETRON HCL 4 MG/2ML IJ SOLN
INTRAMUSCULAR | Status: AC
  Filled 2016-08-18: qty 2

## 2016-08-18 MED ORDER — ONDANSETRON HCL 4 MG/2ML IJ SOLN
INTRAMUSCULAR | Status: DC | PRN
  Administered 2016-08-18: 4 mg via INTRAVENOUS

## 2016-08-18 MED ORDER — SODIUM CHLORIDE 0.9 % IV SOLN
INTRAVENOUS | Status: DC
  Administered 2016-08-18: 07:00:00 via INTRAVENOUS

## 2016-08-18 MED ORDER — MEPERIDINE HCL 100 MG/ML IJ SOLN
INTRAMUSCULAR | Status: DC | PRN
  Administered 2016-08-18: 50 mg via INTRAVENOUS
  Administered 2016-08-18: 25 mg via INTRAVENOUS

## 2016-08-18 MED ORDER — MEPERIDINE HCL 100 MG/ML IJ SOLN
INTRAMUSCULAR | Status: AC
  Filled 2016-08-18: qty 2

## 2016-08-18 NOTE — H&P (View-Only) (Signed)
Referring Provider: Sharilyn Sites, MD Primary Care Physician:  Purvis Kilts, MD Primary GI:  Dr. Gala Romney  Chief Complaint  [name] is a 70 y.o. female who presents for follow-up on GERD and constipation. The patient was last seen in our office 05/11/2016 at which point she was having worsening constipation, takes daily stool softeners with one to 2 bowel movements today although sensation of incomplete emptying. She started on Linzess 75 mg and requested progress report in 2 weeks. Also noted worsening of previously well-controlled GERD on Protonix. Protonix increased to twice a day which is helped some but not resolved her symptoms. Takes NSAIDs about twice a month but had been taking it more recently in the previous few weeks due to arthritis pain. She was scheduled for upper endoscopy and recommended follow-up in 3 months.  She called back saying Linzess 72 g initially effective but had lost effectiveness. She was started on 145 g dose was worked effectively and a prescription was sent into her pharmacy.  EGD completed 05/13/2016 and found normal esophagus, multiple 1-2 mm antral erosions, multiple 3 mm erosions as well appearing to be benign, small hiatal hernia, patent pylorus, normal-appearing duodenum. Gastric ulcer biopsies were taken and symptoms deemed likely due to erosive gastropathy. Surgical pathology of the stomach biopsies found gastric mucosa with reactive and regenerative changes without H. pylori. Recommended 3 month repeat EGD for surveillance.  Today she states her constipation is resolved, has regular bowel movements which are consistent with [facility] 4, no straining. Linzess 145 mcg working very well. GERD improved on PPI twice a day. Deneis abdominal pain, N/V, dysphagia, odynophagia. Denies hematochezia, melena. Deneis fever, chills, unintentional weight loss. She has managed to lose about 4 pounds  intentionally. Denies chest pain, dyspnea, dizziness, lightheadedness, syncope, near syncope. Denies any other upper or lower GI symptoms.  Past Medical History:  Diagnosis Date  . Acid reflux   . Arthritis   . Sinus drainage   . Sleep apnea    had surgery to correct    Past Surgical History:  Procedure Laterality Date  . CARPAL TUNNEL RELEASE    . COLONOSCOPY  02/15/2005   [id number] small polyps ablated via cold biopsy, one from transverse colon and two from the rectum/Small external hemorrhoids  . COLONOSCOPY  07/10/2010   [id number] rectum/long redundant colon, polyps in the sigmoid, descending, hepatic flexure/ADENOMATOUS POLYPS. next TCS due 06/2015  . COLONOSCOPY  1995   3 polyps, path revealed chronic colitis  . COLONOSCOPY N/A 08/05/2015   Procedure: COLONOSCOPY;  Surgeon: Daneil Dolin, MD;  [facility];  Service: Endoscopy;  Laterality: N/A;  1000  . ESOPHAGOGASTRODUODENOSCOPY  1995   Gastritis  . ESOPHAGOGASTRODUODENOSCOPY N/A 04/16/2013   RMR: HH  . ESOPHAGOGASTRODUODENOSCOPY N/A 05/13/2016   Procedure: ESOPHAGOGASTRODUODENOSCOPY (EGD);  Surgeon: Daneil Dolin, MD;  [facility];  Service: Endoscopy;  Laterality: N/A;  145  . KNEE SURGERY     x2  . ROTATOR CUFF REPAIR     x2  . SHOULDER ARTHROSCOPY WITH ROTATOR CUFF REPAIR AND SUBACROMIAL DECOMPRESSION Right 01/21/2015   Procedure: RIGHT SHOULDER ARTHROSCOPIC DEBRIDEMNT OF G-H JOINT AND REMOVAL OF LOOSE BODIES,ARTHROSCOPIC SUBACROMIAL DECOMPRESSION,MINI OPEN RCT REPAIR WITH SUPPLEMENTAL [facility] PATCH;  Surgeon: Garald Balding, MD;  Location: [place];  Service: Orthopedics;  Laterality: Right;  . TRIGGER FINGER RELEASE    . UVULOPALATOPHARYNGOPLASTY  Current Outpatient Prescriptions  Medication Sig Dispense Refill  . albuterol (PROVENTIL HFA;VENTOLIN HFA) 108 (90 BASE) MCG/ACT inhaler Inhale 1 puff into the lungs every 6 (six) hours as needed for wheezing or shortness of breath.    .  allopurinol (ZYLOPRIM) 100 MG tablet Take 100 mg by mouth 3 (three) times daily with meals.    . Biotin 1000 MCG tablet Take 1,000 mcg by mouth every morning.     Marland Kitchen CALCIUM-VITAMIN D PO Take 1 tablet by mouth every morning.    . Cyanocobalamin (VITAMIN B-12 PO) Take 1 tablet by mouth every morning.     . febuxostat (ULORIC) 40 MG tablet Take 40 mg by mouth daily.    . fluticasone (FLONASE) 50 MCG/ACT nasal spray Place 1 spray into both nostrils daily as needed for allergies (sinuses).    Marland Kitchen HYDROcodone-acetaminophen (NORCO/VICODIN) 5-325 MG tablet Take 1 tablet by mouth every 6 (six) hours as needed. 6 tablet 0  . linaclotide (LINZESS) 145 MCG CAPS capsule Take 1 capsule (145 mcg total) by mouth daily before breakfast. 30 capsule 11  . montelukast (SINGULAIR) 10 MG tablet Take 10 mg by mouth every morning.    . pantoprazole (PROTONIX) 40 MG tablet Take 40 mg by mouth 2 (two) times daily before a meal.     . raloxifene (EVISTA) 60 MG tablet Take 60 mg by mouth daily.    Marland Kitchen tolterodine (DETROL) 1 MG tablet Take 1 mg by mouth 2 (two) times daily.     No current facility-administered medications for this visit.     Allergies as of 08/11/2016 - Review Complete 08/11/2016  Allergen Reaction Noted  . Prednisone Other (See Comments) 04/04/2013  . Tramadol Nausea Only 01/21/2015  . Aspirin Nausea And Vomiting   . Celecoxib Rash     Family History  Problem Relation Age of Onset  . Cancer Mother   . Cancer Father   . Diabetes Sister   . Colon cancer Neg Hx   . Liver disease Neg Hx     Social History   Social History  . Marital status: Married    Spouse name: N/A  . Number of children: 2  . Years of education: N/A   Occupational History  . [employer]   Social History Main Topics  . Smoking status: Former Smoker    Years: 48.00    Types: Cigarettes    Quit date: 05/11/2005  . Smokeless tobacco: Never Used  . Alcohol use No  . Drug use: No  . Sexual activity:  Not Asked   Other Topics Concern  . None   Social History Narrative  . None    Review of Systems: General: Negative for anorexia, weight loss, fever, chills, fatigue, weakness. ENT: Negative for hoarseness, difficulty swallowing. CV: Negative for chest pain, angina, palpitations, peripheral edema.  Respiratory: Negative for dyspnea at rest, cough, sputum, wheezing.  GI: See history of present illness. Endo: Negative for unusual weight change.  Heme: Negative for bruising or bleeding.  Physical Exam: BP 140/85   Pulse 84   Temp 97.6 F (36.4 C) (Oral)   Ht 4\' 11"  (1.499 m)   Wt 246 lb (111.6 kg)   BMI 49.69 kg/m  General:   Morbidly obese female. Alert and oriented. Pleasant and cooperative. Well-nourished and well-developed.  Ears:  Normal auditory acuity. Cardiovascular:  S1, S2 present without murmurs appreciated. Extremities without clubbing or edema. Respiratory:  Clear to auscultation bilaterally. No wheezes, rales, or rhonchi.  No distress.  Gastrointestinal:  +BS, obese but soft, non-tender and non-distended. No HSM noted. No guarding or rebound. No masses appreciated.  Rectal:  Deferred  Musculoskalatal:  Symmetrical without gross deformities. Neurologic:  Alert and oriented x4;  grossly normal neurologically. Psych:  Alert and cooperative. Normal mood and affect. Heme/Lymph/Immune:  No excessive bruising noted.    08/11/2016 10:56 AM   Disclaimer: This note was dictated with voice recognition software. Similar sounding words can inadvertently be transcribed and may not be corrected upon review.

## 2016-08-18 NOTE — Discharge Instructions (Addendum)
EGD Discharge instructions Please read the instructions outlined below and refer to this sheet in the next few weeks. These discharge instructions provide you with general information on caring for yourself after you leave the hospital. Your doctor may also give you specific instructions. While your treatment has been planned according to the most current medical practices available, unavoidable complications occasionally occur. If you have any problems or questions after discharge, please call your doctor. ACTIVITY  You may resume your regular activity but move at a slower pace for the next 24 hours.   Take frequent rest periods for the next 24 hours.   Walking will help expel (get rid of) the air and reduce the bloated feeling in your abdomen.   No driving for 24 hours (because of the anesthesia (medicine) used during the test).   You may shower.   Do not sign any important legal documents or operate any machinery for 24 hours (because of the anesthesia used during the test).  NUTRITION  Drink plenty of fluids.   You may resume your normal diet.   Begin with a light meal and progress to your normal diet.   Avoid alcoholic beverages for 24 hours or as instructed by your caregiver.  MEDICATIONS  You may resume your normal medications unless your caregiver tells you otherwise.  WHAT YOU CAN EXPECT TODAY  You may experience abdominal discomfort such as a feeling of fullness or gas pains.  FOLLOW-UP  Your doctor will discuss the results of your test with you.  SEEK IMMEDIATE MEDICAL ATTENTION IF ANY OF THE FOLLOWING OCCUR:  Excessive nausea (feeling sick to your stomach) and/or vomiting.   Severe abdominal pain and distention (swelling).   Trouble swallowing.   Temperature over 101 F (37.8 C).   Rectal bleeding or vomiting of blood.     Your ulcer has completely healed  Decrease Protonix to 40 mg once daily  Avoid nonsteroidals like aspirin Advil and  Aleve  Office visit with [place] as scheduled in 6 months.  Feb 27th 2016 @ 9:00 am with Walden Field

## 2016-08-18 NOTE — Interval H&P Note (Signed)
History and Physical Interval Note:  08/18/2016 7:52 AM  Cheryl [name]  has presented today for surgery, with the diagnosis of erosive gastritis  The various methods of treatment have been discussed with the patient and family. After consideration of risks, benefits and other options for treatment, the patient has consented to  Procedure(s) with comments: ESOPHAGOGASTRODUODENOSCOPY (EGD) (N/A) - 7:45 AM as a surgical intervention .  The patient's history has been reviewed, patient examined, no change in status, stable for surgery.  I have reviewed the patient's chart and labs.  Questions were answered to the patient's satisfaction.      No change; surveillance EGD per plan.  The risks, benefits, limitations, alternatives and imponderables have been reviewed with the patient. Potential for esophageal dilation, biopsy, etc. have also been reviewed.  Questions have been answered. All parties agreeable. Manus Rudd

## 2016-08-18 NOTE — Progress Notes (Signed)
Patient given instructions with web site and code to sign up for "My Chart"  

## 2016-08-18 NOTE — Op Note (Signed)
[facility] Patient Name: Cheryl [name] Procedure Date: 08/18/2016 7:47 AM MRN: [id number] Date of Birth: 1946-05-29 Attending MD: Norvel Richards , MD CSN: [id number] Age: 70 Admit Type: Outpatient Procedure:                Upper GI endoscopy - Surveillance gastric ulcer Indications:              Surveillance procedure Providers:                Norvel Richards, MD, Janeece Riggers, RN, Randa Spike, Technician Referring MD:              Medicines:                Midazolam 5 mg IV, Meperidine 75 mg IV, Ondansetron                            4 mg IV Complications:            No immediate complications. Estimated Blood Loss:     Estimated blood loss: none. Procedure:                Pre-Anesthesia Assessment:                           - Prior to the procedure, a History and Physical                            was performed, and patient medications and                            allergies were reviewed. The patient's tolerance of                            previous anesthesia was also reviewed. The risks                            and benefits of the procedure and the sedation                            options and risks were discussed with the patient.                            All questions were answered, and informed consent                            was obtained. Prior Anticoagulants: The patient has                            taken no previous anticoagulant or antiplatelet                            agents. ASA Grade Assessment: III - A patient with  severe systemic disease. After reviewing the risks                            and benefits, the patient was deemed in                            satisfactory condition to undergo the procedure.                           After obtaining informed consent, the endoscope was                            passed under direct vision. Throughout the   procedure, the patient's blood pressure, pulse, and                            oxygen saturations were monitored continuously. The                            [id number] scope was introduced through the                            mouth, and advanced to the second part of duodenum.                            The patient tolerated the procedure well. The upper                            GI endoscopy was accomplished without difficulty. Scope In: 8:06:19 AM Scope Out: 8:20:40 AM Total Procedure Duration: 0 hours 14 minutes 21 seconds  Findings:      The examined esophagus was normal. Stomach empty. Small hiatal hernia.       Previously noted gastric ulcer completely healed. Normal first and       second portion of the duodenum      The duodenal bulb and second portion of the duodenum were normal.      A small hiatal hernia was present. Impression:               - Normal esophagus.                           - Normal duodenal bulb and second portion of the                            duodenum.                           - Small hiatal hernia.                           - No specimens collected. Moderate Sedation:      Moderate (conscious) sedation was administered by the endoscopy nurse       and supervised by the endoscopist. The following parameters were       monitored: oxygen saturation, heart rate, blood pressure, respiratory       rate,  EKG, adequacy of pulmonary ventilation, and response to care.       Total physician intraservice time was 25 minutes. Recommendation:           - Patient has a contact number available for                            emergencies. The signs and symptoms of potential                            delayed complications were discussed with the                            patient. Return to normal activities tomorrow.                            Written discharge instructions were provided to the                            patient.                            - Advance diet as tolerated.                           - Continue present medications.                           - No repeat upper endoscopy.                           - Return to GI office in 6 months. Procedure Code(s):        --- Professional ---                           [phone], Esophagogastroduodenoscopy, flexible,                            transoral; diagnostic, including collection of                            specimen(s) by brushing or washing, when performed                            (separate procedure)                           [id number], Moderate sedation services provided by the                            same physician or other qualified health care                            professional performing the diagnostic or                            therapeutic service that the sedation supports,  requiring the presence of an independent trained                            observer to assist in the monitoring of the                            patient's level of consciousness and physiological                            status; initial 15 minutes of intraservice time,                            patient age 45 years or older                           [phone], Moderate sedation services; each additional                            15 minutes intraservice time Diagnosis Code(s):        --- Professional ---                           K44.9, Diaphragmatic hernia without obstruction or                            gangrene CPT copyright 2016 [employer]. All rights reserved. The codes documented in this report are preliminary and upon coder review may  be revised to meet current compliance requirements. Cristopher Estimable. Jadden Yim, MD Norvel Richards, MD 08/18/2016 8:34:09 AM This report has been signed electronically. Number of Addenda: 0

## 2016-08-24 ENCOUNTER — Encounter (HOSPITAL_COMMUNITY): Payer: Self-pay | Admitting: Internal Medicine

## 2016-08-30 DIAGNOSIS — M5010 Cervical disc disorder with radiculopathy, unspecified cervical region: Secondary | ICD-10-CM | POA: Diagnosis not present

## 2016-09-02 DIAGNOSIS — Z6841 Body Mass Index (BMI) 40.0 and over, adult: Secondary | ICD-10-CM | POA: Diagnosis not present

## 2016-09-02 DIAGNOSIS — J209 Acute bronchitis, unspecified: Secondary | ICD-10-CM | POA: Diagnosis not present

## 2016-09-07 DIAGNOSIS — R202 Paresthesia of skin: Secondary | ICD-10-CM | POA: Diagnosis not present

## 2016-09-13 DIAGNOSIS — M542 Cervicalgia: Secondary | ICD-10-CM | POA: Diagnosis not present

## 2016-09-13 DIAGNOSIS — M5412 Radiculopathy, cervical region: Secondary | ICD-10-CM | POA: Diagnosis not present

## 2016-09-14 DIAGNOSIS — L668 Other cicatricial alopecia: Secondary | ICD-10-CM | POA: Diagnosis not present

## 2016-09-15 DIAGNOSIS — M5020 Other cervical disc displacement, unspecified cervical region: Secondary | ICD-10-CM | POA: Diagnosis not present

## 2016-09-15 DIAGNOSIS — M5412 Radiculopathy, cervical region: Secondary | ICD-10-CM | POA: Diagnosis not present

## 2016-09-17 ENCOUNTER — Other Ambulatory Visit: Payer: Self-pay | Admitting: Gastroenterology

## 2016-09-22 DIAGNOSIS — R0602 Shortness of breath: Secondary | ICD-10-CM | POA: Diagnosis not present

## 2016-09-22 DIAGNOSIS — R062 Wheezing: Secondary | ICD-10-CM | POA: Diagnosis not present

## 2016-09-22 DIAGNOSIS — J209 Acute bronchitis, unspecified: Secondary | ICD-10-CM | POA: Diagnosis not present

## 2016-09-22 DIAGNOSIS — Z681 Body mass index (BMI) 19 or less, adult: Secondary | ICD-10-CM | POA: Diagnosis not present

## 2016-09-22 DIAGNOSIS — K219 Gastro-esophageal reflux disease without esophagitis: Secondary | ICD-10-CM | POA: Diagnosis not present

## 2016-09-28 DIAGNOSIS — Z23 Encounter for immunization: Secondary | ICD-10-CM | POA: Diagnosis not present

## 2016-10-21 ENCOUNTER — Other Ambulatory Visit: Payer: Self-pay

## 2016-10-21 ENCOUNTER — Encounter (INDEPENDENT_AMBULATORY_CARE_PROVIDER_SITE_OTHER): Payer: Self-pay | Admitting: Specialist

## 2016-10-21 ENCOUNTER — Ambulatory Visit (INDEPENDENT_AMBULATORY_CARE_PROVIDER_SITE_OTHER): Payer: Medicare Other | Admitting: Specialist

## 2016-10-21 VITALS — BP 147/99 | HR 90 | Ht 59.0 in | Wt 235.0 lb

## 2016-10-21 DIAGNOSIS — M50122 Cervical disc disorder at C5-C6 level with radiculopathy: Secondary | ICD-10-CM | POA: Insufficient documentation

## 2016-10-21 DIAGNOSIS — M5020 Other cervical disc displacement, unspecified cervical region: Secondary | ICD-10-CM

## 2016-10-21 MED ORDER — HYDROCODONE-ACETAMINOPHEN 5-325 MG PO TABS: 1.0000 | ORAL_TABLET | Freq: Four times a day (QID) | ORAL | 0 refills | Status: DC | PRN

## 2016-10-21 NOTE — Progress Notes (Signed)
Office Visit Note   Patient: Cheryl [name]           Date of Birth: 1946-09-13           MRN: [id number] Visit Date: 10/21/2016              Requested by: Sharilyn Sites, MD [address] [place], [place] [address] PCP: Purvis Kilts, MD   Assessment & Plan: Visit Diagnoses:  1. Herniated cervical intervertebral disc   2. Cervical disc disorder at C5-C6 level with radiculopathy     Plan: Avoid overhead use of arm and overhead lifting. Avoid lifting greater 5 lbs. If worsening weakness or numbness call our office. Our surgery scheduling secretary, Kandice Hams, will contact you to schedule surgery. Plan to schedule for a cervical spine C5-6 anterior cervical discectomy and fusion with Plate, screws and cage and allograft bone graft (donor bone graft). Hydrocodone for pain.   Follow-Up Instructions: Return in about 4 weeks (around 11/18/2016) for Post surgery return appointment..   Orders:  No orders of the defined types were placed in this encounter.  No orders of the defined types were placed in this encounter.     Procedures: No procedures performed   Clinical Data: Findings:  MRI Cervical spine GI, 07/2016 shows a central and right sided HNP C5-6 extending into the right C6 neuroforamen. Reversal of normal lordotic curve. Mild spondylosis at multiple levels with mild congenital shortening of the pedicles causing borderline canal narrowing at C4-5 and C6-7, central posterior disk osteophyte extending slightly to the left at C4-5 this is chronic and not related to her current pain and radicular pattern    Subjective: Chief Complaint  Patient presents with  . Neck - Follow-up, Pain    Patient returns today as a 3 week recheck on neck pain. Complains today that the pain is becoming worse. Pain radiating into bilat arms. Right greater than left. Numbness in the fingers. Weakness in right arm. States she cant hardly lift anything anymore. Occasional  headaches that occur maybe 1-2 times a week.Persisting left neck and left arm pain and weakness with pain unless she holds the right arm in a certain way. Sleeps with the right arm across then chest. Complains of increasing numbness and parethesias right thumb and index. Went to PT for 4 weeks and pain is still present. Pain is "10" of 10, pain meds are not helping. Underwent and ESI right C7-T1 in august without help. Pain is worsened with neck extension and right sided bending and turning. No bowel or bladder difficulty. No gait disturbance.No history of neck pain prior to onset of this pain 4 months ago. She desires to have intervention.    Review of Systems  Constitutional: Positive for appetite change and fatigue. Negative for activity change, chills, diaphoresis, fever and unexpected weight change.  HENT: Positive for congestion, ear pain, rhinorrhea, sinus pressure, sneezing and sore throat. Negative for dental problem, drooling, ear discharge, facial swelling, trouble swallowing and voice change.   Eyes: Positive for pain, discharge, redness, itching and visual disturbance.  Respiratory: Positive for apnea, cough and wheezing. Negative for shortness of breath and stridor.   Cardiovascular: Negative for chest pain, palpitations and leg swelling.  Gastrointestinal: Negative.  Negative for abdominal distention and abdominal pain.  Endocrine: Negative.   Genitourinary: Negative.  Negative for difficulty urinating and dyspareunia.  Musculoskeletal: Positive for neck pain and neck stiffness.  Skin: Negative.   Allergic/Immunologic: Negative for environmental allergies, food allergies and immunocompromised  state.  Neurological: Positive for weakness, numbness and headaches. Negative for dizziness, tremors, seizures, syncope, facial asymmetry, speech difficulty and light-headedness.  Hematological: Negative.   Psychiatric/Behavioral: Negative for agitation, behavioral problems, confusion, decreased  concentration, dysphoric mood, hallucinations, self-injury, sleep disturbance and suicidal ideas. The patient is not nervous/anxious and is not hyperactive.      Objective: Vital Signs: BP (!) 147/99   Pulse 90   Ht 4\' 11"  (1.499 m)   Wt 235 lb (106.6 kg)   BMI 47.46 kg/m   Physical Exam  Constitutional: She is oriented to person, place, and time. She appears well-developed and well-nourished.  HENT:  Head: Normocephalic and atraumatic.  Eyes: EOM are normal. Pupils are equal, round, and reactive to light.  Neck: Normal range of motion. Neck supple. No JVD present. No tracheal deviation present. No thyromegaly present.  Pulmonary/Chest: Effort normal and breath sounds normal.  Abdominal: Soft. Bowel sounds are normal.  Lymphadenopathy:    She has no cervical adenopathy.  Neurological: She is alert and oriented to person, place, and time. She displays abnormal reflex. She exhibits abnormal muscle tone.  Skin: Skin is warm and dry.  Psychiatric: She has a normal mood and affect. Her behavior is normal. Judgment and thought content normal.    Back Exam   Tenderness  The patient is experiencing tenderness in the cervical.  Range of Motion  Extension: abnormal  Flexion: normal  Lateral Bend Right: abnormal  Lateral Bend Left: normal  Rotation Right: abnormal  Rotation Left: normal   Muscle Strength  Right Quadriceps:  5/5  Left Quadriceps:  5/5  Right Hamstrings:  5/5  Left Hamstrings:  5/5   Tests  Straight leg raise right: negative Straight leg raise left: negative  Reflexes  Patellar: 2/4 Achilles: 2/4 Biceps:  Hyporeflexic abnormal Babinski's sign: normal   Other  Toe Walk: normal Heel Walk: normal Sensation: decreased Gait: normal   Comments:  Positive spurling sign with extension and right turning of the neck. Weak right Biceps 4/5, right wrist flexion 4/5, and right forearm pronation is 4/5 remainder is normal. Hoffman's sign is  negative.      Specialty Comments:  No specialty comments available.  Imaging: No results found.   PMFS History: Patient Active Problem List   Diagnosis Date Noted  . Cervical disc disorder at C5-C6 level with radiculopathy 10/21/2016  . Gastric ulceration   . GERD (gastroesophageal reflux disease) 05/11/2016  . Constipation 05/11/2016  . Hashimoto's thyroiditis 02/20/2016  . History of colonic polyps   . Diverticulosis of colon without hemorrhage   . Complete tear of right rotator cuff 01/21/2015  . Severe obesity (BMI >= 40) ([place]) 01/21/2015  . Nausea alone 04/04/2013  . H/O adenomatous polyp of colon 04/04/2013  . Fatty liver 11/11/2010  . RUQ PAIN 11/11/2010  . NONSPEC ELEVATION OF LEVELS OF TRANSAMINASE/LDH 11/11/2010  . COLONIC POLYPS, HYPERPLASTIC, HX OF 06/25/2010  . ANEMIA-NOS 06/24/2010   Past Medical History:  Diagnosis Date  . Acid reflux   . Arthritis   . Sinus drainage   . Sleep apnea    had surgery to correct    Family History  Problem Relation Age of Onset  . Cancer Mother   . Cancer Father   . Diabetes Sister   . Colon cancer Neg Hx   . Liver disease Neg Hx     Past Surgical History:  Procedure Laterality Date  . CARPAL TUNNEL RELEASE    . COLONOSCOPY  02/15/2005  FXJ:OITGP small polyps ablated via cold biopsy, one from transverse colon and two from the rectum/Small external hemorrhoids  . COLONOSCOPY  07/10/2010   QDI:YMEBRA rectum/long redundant colon, polyps in the sigmoid, descending, hepatic flexure/ADENOMATOUS POLYPS. next TCS due 06/2015  . COLONOSCOPY  1995   3 polyps, path revealed chronic colitis  . COLONOSCOPY N/A 08/05/2015   Procedure: COLONOSCOPY;  Surgeon: Daneil Dolin, MD;  [facility];  Service: Endoscopy;  Laterality: N/A;  1000  . ESOPHAGOGASTRODUODENOSCOPY  1995   Gastritis  . ESOPHAGOGASTRODUODENOSCOPY N/A 04/16/2013   RMR: HH  . ESOPHAGOGASTRODUODENOSCOPY N/A 05/13/2016   Procedure:  ESOPHAGOGASTRODUODENOSCOPY (EGD);  Surgeon: Daneil Dolin, MD;  [facility];  Service: Endoscopy;  Laterality: N/A;  145  . ESOPHAGOGASTRODUODENOSCOPY N/A 08/18/2016   Procedure: ESOPHAGOGASTRODUODENOSCOPY (EGD);  Surgeon: Daneil Dolin, MD;  [facility];  Service: Endoscopy;  Laterality: N/A;  7:45 AM  . KNEE SURGERY     x2  . ROTATOR CUFF REPAIR     x2  . SHOULDER ARTHROSCOPY WITH ROTATOR CUFF REPAIR AND SUBACROMIAL DECOMPRESSION Right 01/21/2015   Procedure: RIGHT SHOULDER ARTHROSCOPIC DEBRIDEMNT OF G-H JOINT AND REMOVAL OF LOOSE BODIES,ARTHROSCOPIC SUBACROMIAL DECOMPRESSION,MINI OPEN RCT REPAIR WITH SUPPLEMENTAL [facility] PATCH;  Surgeon: Garald Balding, MD;  Location: [place];  Service: Orthopedics;  Laterality: Right;  . TRIGGER FINGER RELEASE    . UVULOPALATOPHARYNGOPLASTY     Social History   Occupational History  . [employer]   Social History Main Topics  . Smoking status: Former Smoker    Years: 48.00    Types: Cigarettes    Quit date: 05/11/2005  . Smokeless tobacco: Never Used  . Alcohol use No  . Drug use: No  . Sexual activity: Not on file

## 2016-10-21 NOTE — Patient Instructions (Signed)
Avoid overhead use of arm and overhead lifting. Avoid lifting greater 5 lbs. If worsening weakness or numbness call our office. Our surgery scheduling secretary, Kandice Hams, will contact you to schedule surgery. Plan to schedule for a cervical spine C5-6 anterior cervical discectomy and fusion with Plate, screws and cage and allograft bone graft (donor bone graft). Hydrocodone for pain.

## 2016-10-21 NOTE — Telephone Encounter (Signed)
Pt is requesting 90 day supply of Pantoprazole.

## 2016-10-22 MED ORDER — PANTOPRAZOLE SODIUM 40 MG PO TBEC: 40.0000 mg | DELAYED_RELEASE_TABLET | Freq: Two times a day (BID) | ORAL | 3 refills | Status: DC

## 2016-10-26 ENCOUNTER — Other Ambulatory Visit: Payer: Self-pay

## 2016-10-26 MED ORDER — PANTOPRAZOLE SODIUM 40 MG PO TBEC: 40.0000 mg | DELAYED_RELEASE_TABLET | Freq: Two times a day (BID) | ORAL | 3 refills | Status: DC

## 2016-10-28 DIAGNOSIS — H25813 Combined forms of age-related cataract, bilateral: Secondary | ICD-10-CM | POA: Diagnosis not present

## 2016-10-28 DIAGNOSIS — H40013 Open angle with borderline findings, low risk, bilateral: Secondary | ICD-10-CM | POA: Diagnosis not present

## 2016-11-02 ENCOUNTER — Other Ambulatory Visit: Payer: Medicare Other

## 2016-11-02 ENCOUNTER — Other Ambulatory Visit: Payer: Self-pay

## 2016-11-02 ENCOUNTER — Other Ambulatory Visit: Payer: Self-pay | Admitting: Internal Medicine

## 2016-11-02 DIAGNOSIS — E063 Autoimmune thyroiditis: Secondary | ICD-10-CM

## 2016-11-03 ENCOUNTER — Other Ambulatory Visit: Payer: Medicare Other

## 2016-11-03 DIAGNOSIS — M810 Age-related osteoporosis without current pathological fracture: Secondary | ICD-10-CM | POA: Diagnosis not present

## 2016-11-03 LAB — T4, FREE: FREE T4: 1.08 ng/dL (ref 0.60–1.60)

## 2016-11-03 LAB — TSH: TSH: 1.69 u[IU]/mL (ref 0.35–4.50)

## 2016-11-03 LAB — T3, FREE: T3 FREE: 3.9 pg/mL (ref 2.3–4.2)

## 2016-11-04 DIAGNOSIS — Z1231 Encounter for screening mammogram for malignant neoplasm of breast: Secondary | ICD-10-CM | POA: Diagnosis not present

## 2016-11-17 ENCOUNTER — Encounter (HOSPITAL_COMMUNITY): Payer: Self-pay | Admitting: *Deleted

## 2016-11-19 ENCOUNTER — Ambulatory Visit (HOSPITAL_COMMUNITY): Payer: Medicare Other

## 2016-11-19 ENCOUNTER — Ambulatory Visit (HOSPITAL_COMMUNITY): Payer: Medicare Other | Admitting: Anesthesiology

## 2016-11-19 ENCOUNTER — Observation Stay (HOSPITAL_COMMUNITY)
Admission: RE | Admit: 2016-11-19 | Discharge: 2016-11-20 | Disposition: A | Discharge status: Home / Self Care | Payer: Medicare Other | Source: Ambulatory Visit | Attending: Specialist | Admitting: Specialist

## 2016-11-19 ENCOUNTER — Encounter (HOSPITAL_COMMUNITY)
Admission: RE | Disposition: A | Discharge status: Home / Self Care | Payer: Self-pay | Source: Ambulatory Visit | Attending: Specialist

## 2016-11-19 ENCOUNTER — Encounter (HOSPITAL_COMMUNITY): Payer: Self-pay | Admitting: Surgery

## 2016-11-19 DIAGNOSIS — R262 Difficulty in walking, not elsewhere classified: Secondary | ICD-10-CM

## 2016-11-19 DIAGNOSIS — Z886 Allergy status to analgesic agent status: Secondary | ICD-10-CM | POA: Insufficient documentation

## 2016-11-19 DIAGNOSIS — K279 Peptic ulcer, site unspecified, unspecified as acute or chronic, without hemorrhage or perforation: Secondary | ICD-10-CM | POA: Insufficient documentation

## 2016-11-19 DIAGNOSIS — D649 Anemia, unspecified: Secondary | ICD-10-CM | POA: Diagnosis not present

## 2016-11-19 DIAGNOSIS — M1990 Unspecified osteoarthritis, unspecified site: Secondary | ICD-10-CM | POA: Insufficient documentation

## 2016-11-19 DIAGNOSIS — M4802 Spinal stenosis, cervical region: Secondary | ICD-10-CM | POA: Insufficient documentation

## 2016-11-19 DIAGNOSIS — M5020 Other cervical disc displacement, unspecified cervical region: Secondary | ICD-10-CM | POA: Diagnosis present

## 2016-11-19 DIAGNOSIS — K219 Gastro-esophageal reflux disease without esophagitis: Secondary | ICD-10-CM | POA: Diagnosis not present

## 2016-11-19 DIAGNOSIS — Z888 Allergy status to other drugs, medicaments and biological substances status: Secondary | ICD-10-CM | POA: Diagnosis not present

## 2016-11-19 DIAGNOSIS — M50122 Cervical disc disorder at C5-C6 level with radiculopathy: Principal | ICD-10-CM | POA: Insufficient documentation

## 2016-11-19 DIAGNOSIS — Z01818 Encounter for other preprocedural examination: Secondary | ICD-10-CM

## 2016-11-19 DIAGNOSIS — Z87891 Personal history of nicotine dependence: Secondary | ICD-10-CM | POA: Insufficient documentation

## 2016-11-19 DIAGNOSIS — K5900 Constipation, unspecified: Secondary | ICD-10-CM | POA: Diagnosis not present

## 2016-11-19 DIAGNOSIS — Z419 Encounter for procedure for purposes other than remedying health state, unspecified: Secondary | ICD-10-CM

## 2016-11-19 DIAGNOSIS — Z885 Allergy status to narcotic agent status: Secondary | ICD-10-CM | POA: Diagnosis not present

## 2016-11-19 DIAGNOSIS — M4322 Fusion of spine, cervical region: Secondary | ICD-10-CM | POA: Diagnosis not present

## 2016-11-19 DIAGNOSIS — R05 Cough: Secondary | ICD-10-CM | POA: Diagnosis not present

## 2016-11-19 HISTORY — PX: ANTERIOR CERVICAL DECOMP/DISCECTOMY FUSION: SHX1161

## 2016-11-19 HISTORY — DX: Myoneural disorder, unspecified: G70.9

## 2016-11-19 LAB — COMPREHENSIVE METABOLIC PANEL
ALK PHOS: 78 U/L (ref 38–126)
ALT: 24 U/L (ref 14–54)
ANION GAP: 11 (ref 5–15)
AST: 39 U/L (ref 15–41)
Albumin: 3.7 g/dL (ref 3.5–5.0)
BUN: 15 mg/dL (ref 6–20)
CALCIUM: 9.6 mg/dL (ref 8.9–10.3)
CHLORIDE: 105 mmol/L (ref 101–111)
CO2: 24 mmol/L (ref 22–32)
Creatinine, Ser: 1.17 mg/dL — ABNORMAL HIGH (ref 0.44–1.00)
GFR calc non Af Amer: 47 mL/min — ABNORMAL LOW (ref >60)
GFR, EST AFRICAN AMERICAN: 54 mL/min — AB (ref >60)
Glucose, Bld: 83 mg/dL (ref 65–99)
Potassium: 4.1 mmol/L (ref 3.5–5.1)
SODIUM: 140 mmol/L (ref 135–145)
Total Bilirubin: 0.6 mg/dL (ref 0.3–1.2)
Total Protein: 8 g/dL (ref 6.5–8.1)

## 2016-11-19 LAB — PROTIME-INR
INR: 0.98
Prothrombin Time: 13 seconds (ref 11.4–15.2)

## 2016-11-19 LAB — URINE MICROSCOPIC-ADD ON

## 2016-11-19 LAB — CBC
HCT: 38.5 % (ref 36.0–46.0)
Hemoglobin: 11.8 g/dL — ABNORMAL LOW (ref 12.0–15.0)
MCH: 30.1 pg (ref 26.0–34.0)
MCHC: 30.6 g/dL (ref 30.0–36.0)
MCV: 98.2 fL (ref 78.0–100.0)
PLATELETS: 404 10*3/uL — AB (ref 150–400)
RBC: 3.92 MIL/uL (ref 3.87–5.11)
RDW: 15.2 % (ref 11.5–15.5)
WBC: 8.1 10*3/uL (ref 4.0–10.5)

## 2016-11-19 LAB — URINALYSIS, ROUTINE W REFLEX MICROSCOPIC
Bilirubin Urine: NEGATIVE
GLUCOSE, UA: NEGATIVE mg/dL
KETONES UR: NEGATIVE mg/dL
LEUKOCYTES UA: NEGATIVE
Nitrite: NEGATIVE
PROTEIN: NEGATIVE mg/dL
Specific Gravity, Urine: 1.02 (ref 1.005–1.030)
pH: 5.5 (ref 5.0–8.0)

## 2016-11-19 LAB — APTT: APTT: 41 s — AB (ref 24–36)

## 2016-11-19 SURGERY — ANTERIOR CERVICAL DECOMPRESSION/DISCECTOMY FUSION 1 LEVEL
Anesthesia: General | Site: Spine Cervical

## 2016-11-19 MED ORDER — SODIUM CHLORIDE 0.9 % IV SOLN: 250.0000 mL | INTRAVENOUS | Status: DC

## 2016-11-19 MED ORDER — MONTELUKAST SODIUM 10 MG PO TABS
10.0000 mg | ORAL_TABLET | Freq: Every morning | ORAL | Status: DC
  Administered 2016-11-20: 10 mg via ORAL
  Filled 2016-11-19: qty 1

## 2016-11-19 MED ORDER — FEBUXOSTAT 40 MG PO TABS
40.0000 mg | ORAL_TABLET | Freq: Every day | ORAL | Status: DC
  Administered 2016-11-19: 40 mg via ORAL
  Filled 2016-11-19: qty 1

## 2016-11-19 MED ORDER — PROMETHAZINE HCL 25 MG/ML IJ SOLN: 6.2500 mg | INTRAMUSCULAR | Status: DC | PRN

## 2016-11-19 MED ORDER — METHOCARBAMOL 500 MG PO TABS: 500.0000 mg | ORAL_TABLET | Freq: Three times a day (TID) | ORAL | 1 refills | Status: DC | PRN

## 2016-11-19 MED ORDER — FINASTERIDE 5 MG PO TABS: 1.0000 mg | ORAL_TABLET | Freq: Every day | ORAL | Status: DC

## 2016-11-19 MED ORDER — PROPOFOL 10 MG/ML IV BOLUS
INTRAVENOUS | Status: DC | PRN
  Administered 2016-11-19: 200 mg via INTRAVENOUS

## 2016-11-19 MED ORDER — SODIUM CHLORIDE 0.9% FLUSH: 3.0000 mL | INTRAVENOUS | Status: DC | PRN

## 2016-11-19 MED ORDER — FLEET ENEMA 7-19 GM/118ML RE ENEM: 1.0000 | ENEMA | Freq: Once | RECTAL | Status: DC | PRN

## 2016-11-19 MED ORDER — SUGAMMADEX SODIUM 500 MG/5ML IV SOLN
INTRAVENOUS | Status: AC
  Filled 2016-11-19: qty 5

## 2016-11-19 MED ORDER — CEFAZOLIN IN D5W 1 GM/50ML IV SOLN
1.0000 g | Freq: Three times a day (TID) | INTRAVENOUS | Status: AC
  Administered 2016-11-19 – 2016-11-20 (×2): 1 g via INTRAVENOUS
  Filled 2016-11-19 (×2): qty 50

## 2016-11-19 MED ORDER — CEFAZOLIN SODIUM-DEXTROSE 2-4 GM/100ML-% IV SOLN
2.0000 g | INTRAVENOUS | Status: AC
  Administered 2016-11-19: 2 g via INTRAVENOUS

## 2016-11-19 MED ORDER — THROMBIN 20000 UNITS EX KIT
PACK | CUTANEOUS | Status: DC | PRN
  Administered 2016-11-19: 20 mL via TOPICAL

## 2016-11-19 MED ORDER — SUGAMMADEX SODIUM 200 MG/2ML IV SOLN
INTRAVENOUS | Status: AC
  Filled 2016-11-19: qty 2

## 2016-11-19 MED ORDER — ZOLPIDEM TARTRATE 5 MG PO TABS: 5.0000 mg | ORAL_TABLET | Freq: Every evening | ORAL | Status: DC | PRN

## 2016-11-19 MED ORDER — 0.9 % SODIUM CHLORIDE (POUR BTL) OPTIME
TOPICAL | Status: DC | PRN
  Administered 2016-11-19: 1000 mL

## 2016-11-19 MED ORDER — MEPERIDINE HCL 25 MG/ML IJ SOLN: 6.2500 mg | INTRAMUSCULAR | Status: DC | PRN

## 2016-11-19 MED ORDER — FINASTERIDE 1 MG PO TABS: 1.0000 mg | ORAL_TABLET | Freq: Every day | ORAL | Status: DC

## 2016-11-19 MED ORDER — RALOXIFENE HCL 60 MG PO TABS
60.0000 mg | ORAL_TABLET | Freq: Every day | ORAL | Status: DC
  Administered 2016-11-20: 60 mg via ORAL
  Filled 2016-11-19: qty 1

## 2016-11-19 MED ORDER — BUPIVACAINE LIPOSOME 1.3 % IJ SUSP
INTRAMUSCULAR | Status: DC | PRN
  Administered 2016-11-19: 20 mL

## 2016-11-19 MED ORDER — CHLORHEXIDINE GLUCONATE 4 % EX LIQD: 60.0000 mL | Freq: Once | CUTANEOUS | Status: DC

## 2016-11-19 MED ORDER — ACETAMINOPHEN 325 MG PO TABS
650.0000 mg | ORAL_TABLET | ORAL | Status: DC | PRN
  Administered 2016-11-19: 650 mg via ORAL
  Filled 2016-11-19: qty 2

## 2016-11-19 MED ORDER — LIDOCAINE HCL (CARDIAC) 20 MG/ML IV SOLN
INTRAVENOUS | Status: DC | PRN
  Administered 2016-11-19: 100 mg via INTRAVENOUS

## 2016-11-19 MED ORDER — HYDROMORPHONE HCL 1 MG/ML IJ SOLN
INTRAMUSCULAR | Status: AC
  Filled 2016-11-19: qty 0.5

## 2016-11-19 MED ORDER — PHENOL 1.4 % MT LIQD: 1.0000 | OROMUCOSAL | Status: DC | PRN

## 2016-11-19 MED ORDER — METHOCARBAMOL 1000 MG/10ML IJ SOLN
500.0000 mg | Freq: Four times a day (QID) | INTRAVENOUS | Status: DC | PRN
  Filled 2016-11-19: qty 5

## 2016-11-19 MED ORDER — SODIUM CHLORIDE 0.45 % IV SOLN
INTRAVENOUS | Status: DC
  Administered 2016-11-19: via INTRAVENOUS

## 2016-11-19 MED ORDER — OXYBUTYNIN CHLORIDE ER 5 MG PO TB24: 5.0000 mg | ORAL_TABLET | Freq: Every day | ORAL | Status: DC

## 2016-11-19 MED ORDER — ROCURONIUM BROMIDE 100 MG/10ML IV SOLN
INTRAVENOUS | Status: DC | PRN
  Administered 2016-11-19: 60 mg via INTRAVENOUS

## 2016-11-19 MED ORDER — FENTANYL CITRATE (PF) 100 MCG/2ML IJ SOLN
INTRAMUSCULAR | Status: AC
  Filled 2016-11-19: qty 2

## 2016-11-19 MED ORDER — CEFAZOLIN SODIUM-DEXTROSE 2-4 GM/100ML-% IV SOLN
INTRAVENOUS | Status: AC
  Filled 2016-11-19: qty 100

## 2016-11-19 MED ORDER — DOCUSATE SODIUM 100 MG PO CAPS
100.0000 mg | ORAL_CAPSULE | Freq: Two times a day (BID) | ORAL | Status: DC
  Administered 2016-11-19 – 2016-11-20 (×2): 100 mg via ORAL
  Filled 2016-11-19 (×2): qty 1

## 2016-11-19 MED ORDER — THROMBIN 20000 UNITS EX SOLR
CUTANEOUS | Status: AC
  Filled 2016-11-19: qty 20000

## 2016-11-19 MED ORDER — FINASTERIDE 1 MG PO TABS: 1.0000 mg | ORAL_TABLET | Freq: Two times a day (BID) | ORAL | Status: DC

## 2016-11-19 MED ORDER — HYDROCODONE-ACETAMINOPHEN 5-325 MG PO TABS: 1.0000 | ORAL_TABLET | ORAL | 0 refills | Status: DC | PRN

## 2016-11-19 MED ORDER — METHOCARBAMOL 500 MG PO TABS: 500.0000 mg | ORAL_TABLET | Freq: Four times a day (QID) | ORAL | Status: DC | PRN

## 2016-11-19 MED ORDER — MENTHOL 3 MG MT LOZG: 1.0000 | LOZENGE | OROMUCOSAL | Status: DC | PRN

## 2016-11-19 MED ORDER — ACETAMINOPHEN 650 MG RE SUPP: 650.0000 mg | RECTAL | Status: DC | PRN

## 2016-11-19 MED ORDER — OXYCODONE-ACETAMINOPHEN 5-325 MG PO TABS: 1.0000 | ORAL_TABLET | ORAL | Status: DC | PRN

## 2016-11-19 MED ORDER — ALBUTEROL SULFATE (2.5 MG/3ML) 0.083% IN NEBU: 2.5000 mg | INHALATION_SOLUTION | Freq: Four times a day (QID) | RESPIRATORY_TRACT | Status: DC | PRN

## 2016-11-19 MED ORDER — BUPIVACAINE HCL (PF) 0.5 % IJ SOLN
INTRAMUSCULAR | Status: AC
  Filled 2016-11-19: qty 30

## 2016-11-19 MED ORDER — DEXAMETHASONE SODIUM PHOSPHATE 10 MG/ML IJ SOLN
INTRAMUSCULAR | Status: DC | PRN
  Administered 2016-11-19: 10 mg via INTRAVENOUS

## 2016-11-19 MED ORDER — MIDAZOLAM HCL 2 MG/2ML IJ SOLN
INTRAMUSCULAR | Status: AC
  Filled 2016-11-19: qty 2

## 2016-11-19 MED ORDER — HYDROCODONE-ACETAMINOPHEN 5-325 MG PO TABS
1.0000 | ORAL_TABLET | ORAL | Status: DC | PRN
  Administered 2016-11-20: 1 via ORAL
  Administered 2016-11-20: 2 via ORAL
  Filled 2016-11-19: qty 1
  Filled 2016-11-19: qty 2

## 2016-11-19 MED ORDER — PHENYLEPHRINE HCL 10 MG/ML IJ SOLN
INTRAVENOUS | Status: DC | PRN
  Administered 2016-11-19: 25 ug/min via INTRAVENOUS

## 2016-11-19 MED ORDER — PROPOFOL 10 MG/ML IV BOLUS
INTRAVENOUS | Status: AC
  Filled 2016-11-19: qty 20

## 2016-11-19 MED ORDER — ARTIFICIAL TEARS OP OINT
TOPICAL_OINTMENT | OPHTHALMIC | Status: DC | PRN
  Administered 2016-11-19: 1 via OPHTHALMIC

## 2016-11-19 MED ORDER — MIDAZOLAM HCL 5 MG/5ML IJ SOLN
INTRAMUSCULAR | Status: DC | PRN
  Administered 2016-11-19: 2 mg via INTRAVENOUS

## 2016-11-19 MED ORDER — GABAPENTIN 100 MG PO CAPS: 100.0000 mg | ORAL_CAPSULE | Freq: Every evening | ORAL | Status: DC | PRN

## 2016-11-19 MED ORDER — BUPIVACAINE LIPOSOME 1.3 % IJ SUSP
20.0000 mL | INTRAMUSCULAR | Status: DC
  Filled 2016-11-19: qty 20

## 2016-11-19 MED ORDER — ROCURONIUM BROMIDE 10 MG/ML (PF) SYRINGE
PREFILLED_SYRINGE | INTRAVENOUS | Status: AC
  Filled 2016-11-19: qty 30

## 2016-11-19 MED ORDER — FLUTICASONE PROPIONATE 50 MCG/ACT NA SUSP
1.0000 | Freq: Every day | NASAL | Status: DC | PRN
  Filled 2016-11-19: qty 16

## 2016-11-19 MED ORDER — ALLOPURINOL 300 MG PO TABS
300.0000 mg | ORAL_TABLET | Freq: Every day | ORAL | Status: DC
  Administered 2016-11-19: 300 mg via ORAL
  Filled 2016-11-19: qty 1

## 2016-11-19 MED ORDER — HYDROMORPHONE HCL 1 MG/ML IJ SOLN
0.2500 mg | INTRAMUSCULAR | Status: DC | PRN
  Administered 2016-11-19: 0.5 mg via INTRAVENOUS

## 2016-11-19 MED ORDER — LIDOCAINE 2% (20 MG/ML) 5 ML SYRINGE
INTRAMUSCULAR | Status: AC
  Filled 2016-11-19: qty 5

## 2016-11-19 MED ORDER — BISACODYL 5 MG PO TBEC: 5.0000 mg | DELAYED_RELEASE_TABLET | Freq: Every day | ORAL | Status: DC | PRN

## 2016-11-19 MED ORDER — LACTATED RINGERS IV SOLN
INTRAVENOUS | Status: DC
  Administered 2016-11-19 (×2): via INTRAVENOUS

## 2016-11-19 MED ORDER — LACTATED RINGERS IV SOLN: INTRAVENOUS | Status: DC

## 2016-11-19 MED ORDER — SUGAMMADEX SODIUM 500 MG/5ML IV SOLN
INTRAVENOUS | Status: DC | PRN
  Administered 2016-11-19: 400 mg via INTRAVENOUS

## 2016-11-19 MED ORDER — PANTOPRAZOLE SODIUM 40 MG PO TBEC
40.0000 mg | DELAYED_RELEASE_TABLET | Freq: Two times a day (BID) | ORAL | Status: DC
  Administered 2016-11-19 – 2016-11-20 (×2): 40 mg via ORAL
  Filled 2016-11-19 (×2): qty 1

## 2016-11-19 MED ORDER — BUPIVACAINE HCL 0.5 % IJ SOLN
INTRAMUSCULAR | Status: DC | PRN
  Administered 2016-11-19: 20 mL

## 2016-11-19 MED ORDER — MORPHINE SULFATE (PF) 2 MG/ML IV SOLN: 1.0000 mg | INTRAVENOUS | Status: DC | PRN

## 2016-11-19 MED ORDER — FENTANYL CITRATE (PF) 100 MCG/2ML IJ SOLN
INTRAMUSCULAR | Status: DC | PRN
  Administered 2016-11-19 (×6): 50 ug via INTRAVENOUS

## 2016-11-19 MED ORDER — POLYETHYLENE GLYCOL 3350 17 G PO PACK: 17.0000 g | PACK | Freq: Every day | ORAL | Status: DC | PRN

## 2016-11-19 MED ORDER — ONDANSETRON HCL 4 MG/2ML IJ SOLN: 4.0000 mg | INTRAMUSCULAR | Status: DC | PRN

## 2016-11-19 MED ORDER — VITAMIN B-12 1000 MCG PO TABS
1000.0000 ug | ORAL_TABLET | Freq: Every day | ORAL | Status: DC
  Administered 2016-11-19 – 2016-11-20 (×2): 1000 ug via ORAL
  Filled 2016-11-19 (×2): qty 1

## 2016-11-19 MED ORDER — SODIUM CHLORIDE 0.9% FLUSH
3.0000 mL | Freq: Two times a day (BID) | INTRAVENOUS | Status: DC
  Administered 2016-11-19: 3 mL via INTRAVENOUS

## 2016-11-19 SURGICAL SUPPLY — 57 items
BIT DRILL SKYLINE 12MM (BIT) ×1 IMPLANT
BLADE SURG ROTATE 9660 (MISCELLANEOUS) IMPLANT
BONE MATRIX VIVIGEN 1CC (Bone Implant) ×2 IMPLANT
BUR MATCHSTICK NEURO 3.0 LAGG (BURR) ×2 IMPLANT
BUR RND FLUTED 2.5 (BURR) IMPLANT
BUR SABER RD CUTTING 3.0 (BURR) IMPLANT
CANISTER SUCTION 2500CC (MISCELLANEOUS) ×2 IMPLANT
COLLAR CERV LO CONTOUR FIRM DE (SOFTGOODS) ×2 IMPLANT
COVER SURGICAL LIGHT HANDLE (MISCELLANEOUS) ×2 IMPLANT
DERMABOND ADVANCED (GAUZE/BANDAGES/DRESSINGS) ×1
DERMABOND ADVANCED .7 DNX12 (GAUZE/BANDAGES/DRESSINGS) ×1 IMPLANT
DRAIN TLS ROUND 10FR (DRAIN) IMPLANT
DRAPE C-ARM 42X72 X-RAY (DRAPES) IMPLANT
DRAPE MICROSCOPE LEICA (MISCELLANEOUS) ×2 IMPLANT
DRAPE POUCH INSTRU U-SHP 10X18 (DRAPES) ×2 IMPLANT
DRAPE SURG 17X23 STRL (DRAPES) ×6 IMPLANT
DRILL BIT SKYLINE 12MM (BIT) ×1
DRSG MEPILEX BORDER 4X4 (GAUZE/BANDAGES/DRESSINGS) IMPLANT
DURAPREP 6ML APPLICATOR 50/CS (WOUND CARE) ×2 IMPLANT
ELECT BLADE 4.0 EZ CLEAN MEGAD (MISCELLANEOUS) ×2
ELECT COATED BLADE 2.86 ST (ELECTRODE) ×2 IMPLANT
ELECT REM PT RETURN 9FT ADLT (ELECTROSURGICAL) ×2
ELECTRODE BLDE 4.0 EZ CLN MEGD (MISCELLANEOUS) ×1 IMPLANT
ELECTRODE REM PT RTRN 9FT ADLT (ELECTROSURGICAL) ×1 IMPLANT
ENDOSKELETON TC 14X12-9MM 6DEG (Cage) ×2 IMPLANT
GLOVE BIOGEL PI IND STRL 8 (GLOVE) ×1 IMPLANT
GLOVE BIOGEL PI INDICATOR 8 (GLOVE) ×1
GLOVE ECLIPSE 9.0 STRL (GLOVE) ×2 IMPLANT
GLOVE SURG 8.5 LATEX PF (GLOVE) ×2 IMPLANT
GLOVE SURG ORTHO 8.0 STRL STRW (GLOVE) ×2 IMPLANT
GOWN STRL REUS W/ TWL LRG LVL3 (GOWN DISPOSABLE) ×1 IMPLANT
GOWN STRL REUS W/TWL 2XL LVL3 (GOWN DISPOSABLE) ×4 IMPLANT
GOWN STRL REUS W/TWL LRG LVL3 (GOWN DISPOSABLE) ×1
HEAD HALTER (SOFTGOODS) ×2 IMPLANT
KIT BASIN OR (CUSTOM PROCEDURE TRAY) ×2 IMPLANT
KIT ROOM TURNOVER OR (KITS) ×2 IMPLANT
NEEDLE SPNL 20GX3.5 QUINCKE YW (NEEDLE) ×8 IMPLANT
NS IRRIG 1000ML POUR BTL (IV SOLUTION) ×2 IMPLANT
PACK ORTHO CERVICAL (CUSTOM PROCEDURE TRAY) ×2 IMPLANT
PAD ARMBOARD 7.5X6 YLW CONV (MISCELLANEOUS) ×4 IMPLANT
PATTIES SURGICAL .5 X.5 (GAUZE/BANDAGES/DRESSINGS) IMPLANT
PIN DISTRACTION 14MM (PIN) ×6 IMPLANT
PLATE SKYLINE 12MM (Plate) ×2 IMPLANT
SCREW VARIABLE SELF TAP 12MM (Screw) ×8 IMPLANT
SPONGE INTESTINAL PEANUT (DISPOSABLE) ×4 IMPLANT
SPONGE LAP 4X18 X RAY DECT (DISPOSABLE) ×2 IMPLANT
SPONGE SURGIFOAM ABS GEL 100 (HEMOSTASIS) ×2 IMPLANT
SUT ETHILON 4 0 PS 2 18 (SUTURE) IMPLANT
SUT VIC AB 2-0 CT1 27 (SUTURE) ×1
SUT VIC AB 2-0 CT1 TAPERPNT 27 (SUTURE) ×1 IMPLANT
SUT VIC AB 3-0 X1 27 (SUTURE) IMPLANT
SUT VICRYL 4-0 PS2 18IN ABS (SUTURE) ×2 IMPLANT
SYR 20CC LL (SYRINGE) ×2 IMPLANT
SYSTEM CHEST DRAIN TLS 7FR (DRAIN) IMPLANT
TOWEL OR 17X24 6PK STRL BLUE (TOWEL DISPOSABLE) ×2 IMPLANT
TOWEL OR 17X26 10 PK STRL BLUE (TOWEL DISPOSABLE) ×2 IMPLANT
WATER STERILE IRR 1000ML POUR (IV SOLUTION) IMPLANT

## 2016-11-19 NOTE — Anesthesia Postprocedure Evaluation (Signed)
Anesthesia Post Note  Patient: Cheryl [name]  Procedure(s) Performed: Procedure(s) (LRB): ANTERIOR CERVICAL DISCECTOMY AND FUSION C5-6 WITH PLATES, SCREWS, CAGE, VIVIGEN II (N/A)  Patient location during evaluation: PACU Anesthesia Type: General Level of consciousness: awake Pain management: pain level controlled Vital Signs Assessment: post-procedure vital signs reviewed and stable Respiratory status: spontaneous breathing Cardiovascular status: stable Anesthetic complications: no    Last Vitals:  Vitals:   11/19/16 1047 11/19/16 1557  BP: (!) 152/79 (!) 169/98  Pulse: 69 87  Resp: 20   Temp: 36.7 C 36.4 C    Last Pain:  Vitals:   11/19/16 1557  TempSrc:   PainSc: 0-No pain                 EDWARDS,Akyra Bouchie

## 2016-11-19 NOTE — Progress Notes (Signed)
Orthopedic Tech Progress Note Patient Details:  Cheryl [name] 1946/11/30 [id number] Patient has soft collar. Patient ID: YUVIA PLANT, female   DOB: 1946/01/15, 70 y.o.   MRN: [id number]   Braulio Bosch 11/19/2016, 6:59 PM

## 2016-11-19 NOTE — Transfer of Care (Signed)
Immediate Anesthesia Transfer of Care Note  Patient: Cheryl [name]  Procedure(s) Performed: Procedure(s): ANTERIOR CERVICAL DISCECTOMY AND FUSION C5-6 WITH PLATES, SCREWS, CAGE, VIVIGEN II (N/A)  Patient Location: PACU  Anesthesia Type:General  Level of Consciousness: awake, alert  and oriented  Airway & Oxygen Therapy: Patient Spontanous Breathing and Patient connected to nasal cannula oxygen  Post-op Assessment: Report given to RN, Post -op Vital signs reviewed and stable and Patient moving all extremities X 4  Post vital signs: Reviewed and stable  Last Vitals:  Vitals:   11/19/16 1047  BP: (!) 152/79  Pulse: 69  Resp: 20  Temp: 36.7 C    Last Pain:  Vitals:   11/19/16 1104  TempSrc:   PainSc: 7       Patients Stated Pain Goal: 7 (47/99/87 2158)  Complications: No apparent anesthesia complications

## 2016-11-19 NOTE — H&P (Signed)
Cheryl [name] is an 70 y.o. female.   Chief Complaint: neck pain and right UE radiculopathy  HPI:  Patient with hx of C5-6 stenosis presents with the above complaint.  Progressively worsening symptoms.  Failed conservative treatment.   Past Medical History:  Diagnosis Date  . Acid reflux   . Arthritis   . Neuromuscular disorder ([place])   . Sinus drainage   . Sleep apnea    had surgery to correct    Past Surgical History:  Procedure Laterality Date  . CARPAL TUNNEL RELEASE    . COLONOSCOPY  02/15/2005   IEP:PIRJJ small polyps ablated via cold biopsy, one from transverse colon and two from the rectum/Small external hemorrhoids  . COLONOSCOPY  07/10/2010   OAC:ZYSAYT rectum/long redundant colon, polyps in the sigmoid, descending, hepatic flexure/ADENOMATOUS POLYPS. next TCS due 06/2015  . COLONOSCOPY  1995   3 polyps, path revealed chronic colitis  . COLONOSCOPY N/A 08/05/2015   Procedure: COLONOSCOPY;  Surgeon: Daneil Dolin, MD;  [facility];  Service: Endoscopy;  Laterality: N/A;  1000  . ESOPHAGOGASTRODUODENOSCOPY  1995   Gastritis  . ESOPHAGOGASTRODUODENOSCOPY N/A 04/16/2013   RMR: HH  . ESOPHAGOGASTRODUODENOSCOPY N/A 05/13/2016   Procedure: ESOPHAGOGASTRODUODENOSCOPY (EGD);  Surgeon: Daneil Dolin, MD;  [facility];  Service: Endoscopy;  Laterality: N/A;  145  . ESOPHAGOGASTRODUODENOSCOPY N/A 08/18/2016   Procedure: ESOPHAGOGASTRODUODENOSCOPY (EGD);  Surgeon: Daneil Dolin, MD;  [facility];  Service: Endoscopy;  Laterality: N/A;  7:45 AM  . KNEE SURGERY     x2  . ROTATOR CUFF REPAIR     x2  . SHOULDER ARTHROSCOPY WITH ROTATOR CUFF REPAIR AND SUBACROMIAL DECOMPRESSION Right 01/21/2015   Procedure: RIGHT SHOULDER ARTHROSCOPIC DEBRIDEMNT OF G-H JOINT AND REMOVAL OF LOOSE BODIES,ARTHROSCOPIC SUBACROMIAL DECOMPRESSION,MINI OPEN RCT REPAIR WITH SUPPLEMENTAL [facility] PATCH;  Surgeon: Garald Balding, MD;  Location: [place];  Service: Orthopedics;   Laterality: Right;  . TONSILLECTOMY    . TRIGGER FINGER RELEASE    . UVULOPALATOPHARYNGOPLASTY      Family History  Problem Relation Age of Onset  . Cancer Mother   . Cancer Father   . Diabetes Sister   . Colon cancer Neg Hx   . Liver disease Neg Hx    Social History:  reports that she quit smoking about 11 years ago. Her smoking use included Cigarettes. She quit after 48.00 years of use. She has never used smokeless tobacco. She reports that she does not drink alcohol or use drugs.  Allergies:  Allergies  Allergen Reactions  . Prednisone Other (See Comments)    Hallucinations   . Tramadol Nausea Only  . Aspirin Nausea And Vomiting  . Celecoxib Rash    No prescriptions prior to admission.    No results found for this or any previous visit (from the past 48 hour(s)). No results found.  Review of Systems  Constitutional: Negative.   HENT: Negative.   Eyes: Negative.   Respiratory: Negative.   Cardiovascular: Negative.   Gastrointestinal: Negative.   Genitourinary: Negative.   Musculoskeletal: Positive for neck pain.  Neurological: Positive for tingling.  Psychiatric/Behavioral: Negative.     There were no vitals taken for this visit. Physical Exam  Constitutional: She is oriented to person, place, and time. She appears well-developed. No distress.  HENT:  Head: Normocephalic.  Eyes: EOM are normal. Pupils are equal, round, and reactive to light.  Respiratory: No respiratory distress.  GI: She exhibits no distension.  Musculoskeletal: She exhibits tenderness.  Neurological: She is alert and oriented to person, place, and time.  Skin: Skin is warm and dry.  Psychiatric: She has a normal mood and affect.    Study Result   CLINICAL DATA:  Neck pain radiating into the right arm with numbness and tingling extending to the right hand and fingers. Symptoms began 3 months ago.  EXAM: MRI CERVICAL SPINE WITHOUT CONTRAST  TECHNIQUE: Multiplanar, multisequence  MR imaging of the cervical spine was performed. No intravenous contrast was administered.  COMPARISON:  None.  FINDINGS: Alignment: Reversal of cervical lordosis without focal subluxation  Vertebrae: No fracture, evidence of discitis, or bone lesion.  Cord: Normal signal and morphology  Posterior Fossa, vertebral arteries, paraspinal tissues: At C5 and C6 there is a midline fluid collection that has an encysted appearance favoring incidental ganglion or other chronic process. No inflammatory appearing prevertebral effusion. Retropharyngeal carotids.  Disc levels:  C2-3: Left facet arthropathy with spurring. No herniation or impingement  C3-4: Posterior disc osteophyte complex. Uncovertebral ridging on the right. Negative facets. Mild canal stenosis with disc contacting and mildly deforming the ventral cord. Mild right foraminal narrowing  C4-5: Left paracentral disc protrusion which contacts and mildly flattens the left cord. Mild uncovertebral ridging. Patent foramina.  C5-6: Right paracentral to foraminal disc protrusion best seen on T2 weighted imaging. There is mild mass effect on the ventral cord and stenosis of the right foraminal entry which could affect the C6 nerve.  C6-7: Disc narrowing with mild bulging and ridging. Partial CSF effacement ventrally. No cord mass effect  C7-T1:Ventral spondylosis. Negative facets. No herniation or impingement  IMPRESSION: 1. In this patient with reported right radicular symptoms a C5-6 right paracentral to foraminal disc protrusion is most notable. Mild right foraminal narrowing at C3-4. 2. Mild canal stenosis with mild cord mass effect at C3-4, C4-5, and C5-6 from disc disease.      Assessment/Plan C5-6 stenosis/HNP, neck pain and right UE radiculopathy.  Will proceed with ANTERIOR CERVICAL DISCECTOMY AND FUSION C5-6 WITH PLATES, SCREWS, CAGE, VIVIGEN II as scheduled.  Surgical procedure along with  possible risks and complications discussed.  All questions answered and wishes to proceed.     Benjiman Core, PA-C 11/19/2016, 7:32 AM  Patient examined and lab reviewed with Ricard Dillon, PA-C.

## 2016-11-19 NOTE — Discharge Instructions (Signed)
° ° °  No lifting greater than 10 lbs. No overhead use of arms. °Avoid bending,and twisting neck. °Walk in house for first week them may start to get out slowly increasing distance up to one quarter mile by 3 weeks post op. °Keep incision dry for 3 days, may then bathe and wet incision using a [place] collar when showering. °Call if any fevers >101, chills, or increasing numbness or weakness or increased swelling or drainage. ° °

## 2016-11-19 NOTE — Anesthesia Preprocedure Evaluation (Addendum)
Anesthesia Evaluation  Patient identified by MRN, date of birth, ID band Patient awake    Reviewed: Allergy & Precautions, NPO status , Patient's Chart, lab work & pertinent test results  Airway Mallampati: II  TM Distance: >3 FB     Dental  (+) Teeth Intact, Dental Advidsory Given   Pulmonary sleep apnea , former smoker,    breath sounds clear to auscultation       Cardiovascular negative cardio ROS   Rhythm:Regular Rate:Normal     Neuro/Psych    GI/Hepatic Neg liver ROS, PUD, GERD  ,  Endo/Other  negative endocrine ROS  Renal/GU negative Renal ROS     Musculoskeletal  (+) Arthritis ,   Abdominal   Peds  Hematology  (+) anemia ,   Anesthesia Other Findings   Reproductive/Obstetrics                            Anesthesia Physical Anesthesia Plan  ASA: III  Anesthesia Plan: General   Post-op Pain Management:    Induction: Intravenous  Airway Management Planned: Oral ETT  Additional Equipment:   Intra-op Plan:   Post-operative Plan: Possible Post-op intubation/ventilation  Informed Consent: I have reviewed the patients History and Physical, chart, labs and discussed the procedure including the risks, benefits and alternatives for the proposed anesthesia with the patient or authorized representative who has indicated his/her understanding and acceptance.   Dental advisory given and Dental Advisory Given  Plan Discussed with: CRNA and Anesthesiologist  Anesthesia Plan Comments:        Anesthesia Quick Evaluation

## 2016-11-19 NOTE — Op Note (Signed)
11/19/2016  3:37 PM  PATIENT:  Cheryl [name]  70 y.o. female  MRN: [id number]  OPERATIVE REPORT  PRE-OPERATIVE DIAGNOSIS:  central and right C5-6 herniated nucleus pulposus  POST-OPERATIVE DIAGNOSIS: central and right C5-6 herniated nucleus pulposus  PROCEDURE:  Procedure(s): ANTERIOR CERVICAL DISCECTOMY AND FUSION C5-6 WITH PLATES, SCREWS, CAGE, VIVIGEN II    SURGEON:  Jessy Oto, MD     ASSISTANT:  Benjiman Core, PA-C  (Present throughout the entire procedure and necessary for completion of procedure in a timely manner)     ANESTHESIA:  General,supplemented with local marcaine0.5% 1:1 exparel 1.3%  total of 10 cc. Dr. Oletta Lamas.    COMPLICATIONS:  None.  DRAINS: None.     COMPONENTS:  Implant Name Type Inv. Item Serial No. Manufacturer Lot No. LRB No. Used  BONE MATRIX VIVIGEN 1CC - [id number] Bone Implant BONE MATRIX VIVIGEN 1CC [id number] [facility]  N/A 1  6 deg small (14x12) 58m endoskeleton implant Spacer   TITAN SPINE [id number]/A 1  PLATE SKYLINE 150NL- [id number] PLATE SKYLINE 179KW [employer]  N/A 1  SCREW VARIABLE SELF TAP 12MM - [id number] SCREW VARIABLE SELF TAP 12MM   [employer]   N/A 4    PROCEDURE:The patient was met in the holding area, and the appropriate  left C5.6 cervical level identified and marked with an "x" and my initials. The patient was then transported to OR and was placed on the operative table in a supine position head supported on the well padded [place] horseshoe. The patient was then placed under  general anesthesia without difficulty intubated atraumaticly.      Cervical spine was positioned with a [place] horseshoe and 5 pound cervical halter traction. All pressure points well padded and semi-beach chair position. Arm holder both arms.The patient had a short neck with large chest and shoulder, so the shoulder were taped downwards and the upper anterior chest skin placed on  traction to allow for the anterior neck to be exposed. Additionally the table was placed in mild reverse trendelenburg. Standard prep with DuraPrep solution the anterior cervical spine chest. Draped in the usual manner. Iodine vi drape was used. Standard timeout protocol was carried out identifying the patient procedure side of the procedure and level. The skin the left neck was infiltrated with marcaine0.5% 1:1 exparel 1.3%  total of 10 cc. This at the level of expected C5-6 incision and also along a skin crease in line with the patients lines of Langer. Incision transverse at the C6 level and carried down to the level of the platysma. Then was carried down to the anterior aspect of the sternocleidomastoid muscle. The interval between the trachea and esophagus medially and the carotid sheath laterally was developed as a Metzenbaum scissors and blunt dissection exposing the anterior aspect of cervical spine at the C6 level.  The prevertebral fascia anterior to the cervical was cauterized with bipolar and teased across the midline with a KArt therapist An 18-gauge spinal needle was placed with sheath intact allowing only a centimeter to extend into the C5-6 disc and observed on lateral radiogragh  at the C5-6 level. Handheld Cloward retraction of the soft tissues while identifying the level at C5-6 and also while removing a portion of the anterior aspect of the disc with15 blade scalpel and pituitary. Medial border of the longus collie muscles was carefully elevated bilaterally and self-retaining retractors were introduced the foot of the blade beneath the  medial border of the longus colli muscles. Soft tissue overlying the anterior borders of the disc space at C5-6 level carefully debridement of soft tissue back to bony edges. The anterior lip osteophytes were then resected using rongeur. This bone was preserved for later bone grafting purposes. The disc space was then first prepared using loupe magnification and  headlight with resection of degenerative disc annulus anteriorly and posteriorly and cartilaginous endplates using micro-curettes pituitaries and a high-speed bur. The operating room microscope was draped sterilely and brought into the field. Under the operating room microscope and posterior aspect of the disc was excised using micro-curettes pituitary rongeur and times per. Posterior lip osteophytes were drilled using a high-speed bur and a carefully resected with 1 and 2 mm Kerrison foraminotomy performed over both C6 nerve roots using 1 and 2 mm Kerrisons disc herniation material was noted centrally and into the left foramen some of which represented reflected portion of the patient's annulus into the neuroforamen. Following decompression of the spinal cord and both C6 nerve roots, irrigation was carried out. The endplates of the inferior aspect of C5 and superior aspect of C6 were carefully prepared using a high-speed bur to parallel. The disc space was then sounded utilized and a precontoured sounder for the transgraft implant. Surrounding was carried up to a 40m implant.  9 mm small nanolock cervical cage was felt to provide best fit for the C5-6 disc space. The permanent lordotic Ti cage implant was then brought onto the field. It was then packed with local bone graft that had been harvested previously in addition to vivigen allograft. The implant was then carefully placed over the intervertebral disc space at C5-6 level. Care taken to ensure that no bone or soft tissue debris within the disc space that could be retropulsed with insertion of the cage and bone graft. The implant was then impacted into place with the head placed in longitudinal cervical traction.  The implant was felt to be in excellent position alignment. Cervical distraction instrumentation was removed and the screw post holes coated with wax for hemostasis. Length of the plate was then chosen using bone wax applied to a cottonoid string and  measuring from the edge of the lower cervical vertebral border of C5 the upper cervical border of C6  A  12 millimeter plate was chosen. 12 mm screws were then placed into the C5  locking the plate in place. Additional 12 mm screws were then placed in the C6 level  cervical traction had been released prior to screw placement. Intraoperative lateral radiograph demonstrated the plates and screws in good position alignment no sign of impingement upon the cervical canal. The graft appeared in good position alignment.  Upper extremity longitudinal traction using wrist restraints was necessary to obtain visualization of the lower cervical level.  This point irrigation was carried out at cervical incision site.The esophagus examined at the cervical level  and found to be normal. Irrigation was again carried out there was no active bleeding present. The incisions were then closed by approximating the deep subcutaneous layers the platysma layer with interrupted 3-0 Vicryl suture and the superficial fascia overlying the sternocleidomastoid muscle with interrupted 3-0 Vicryl sutures. The subcutaneous layers were approximated with interrupted 3-0 Vicryl sutures as were the superficial layers. The skin was closed with a running subcutaneous stitch of 4-0 Vicryl at the operative C5-6 transverse incision site. Skin was approximated with Dermabond. Mepilex bandage was applied. A soft collar was then applied to the cervical spine  released on the cervical spine and drapes were removed. Physician assistant's responsibilities:Carron Mcmurry Ricard Dillon, PA-C perform the duties of assistant physician and surgeon during this case present from the beginning of the case to the end of the case. He assisted with careful retraction of neural structures suctioning about her elements including cervical cord and C6 nerve root. Performed closure of the incision on the ligamentum nuchae to the skin and application of dressing. He assisted in positioning the  patient had removal the patient from the OR table to her stretcher.     Jessy Oto 11/19/2016, 3:37 PM

## 2016-11-19 NOTE — Brief Op Note (Signed)
11/19/2016  3:26 PM  PATIENT:  Larence Penning Gurka  70 y.o. female  PRE-OPERATIVE DIAGNOSIS:  central and right C5-6 herniated nucleus pulposus  POST-OPERATIVE DIAGNOSIS:  * No post-op diagnosis entered *  PROCEDURE:  Procedure(s): ANTERIOR CERVICAL DISCECTOMY AND FUSION C5-6 WITH PLATES, SCREWS, CAGE, VIVIGEN II (N/A)  SURGEON:  Surgeon(s) and Role:    * Jessy Oto, MD - Primary  PHYSICIAN ASSISTANT:James Owens,PA-C   ANESTHESIA:   local and general, Dr. Oletta Lamas.  EBL:  Total I/O In: 1000 [I.V.:1000] Out: 100 [Blood:100]  BLOOD ADMINISTERED:none  DRAINS: none   LOCAL MEDICATIONS USED:  MARCAINE 0.5% 1:1 EXPAREL 1.3% Amount: 10 ml  SPECIMEN:  No Specimen  DISPOSITION OF SPECIMEN:  N/A  COUNTS:  YES  TOURNIQUET:  * No tourniquets in log *  DICTATION: .Dragon Dictation  PLAN OF CARE: Admit for overnight observation  PATIENT DISPOSITION:  PACU - hemodynamically stable.   Delay start of Pharmacological VTE agent (>24hrs) due to surgical blood loss or risk of bleeding: yes

## 2016-11-19 NOTE — Interval H&P Note (Signed)
History and Physical Interval Note:  11/19/2016 12:50 PM  Cheryl [name]  has presented today for surgery, with the diagnosis of central and right C5-6 herniated nucleus pulposus  The various methods of treatment have been discussed with the patient and family. After consideration of risks, benefits and other options for treatment, the patient has consented to  Procedure(s): ANTERIOR CERVICAL DISCECTOMY AND FUSION C5-6 WITH PLATES, SCREWS, CAGE, VIVIGEN II (N/A) as a surgical intervention .  The patient's history has been reviewed, patient examined, no change in status, stable for surgery.  I have reviewed the patient's chart and labs.  Questions were answered to the patient's satisfaction.     Jessy Oto

## 2016-11-19 NOTE — Anesthesia Procedure Notes (Signed)
Procedure Name: Intubation Date/Time: 11/19/2016 1:07 PM Performed by: Neldon Newport Pre-anesthesia Checklist: Timeout performed, Patient being monitored, Suction available, Emergency Drugs available and Patient identified Patient Re-evaluated:Patient Re-evaluated prior to inductionOxygen Delivery Method: Circle system utilized Preoxygenation: Pre-oxygenation with 100% oxygen Intubation Type: IV induction Ventilation: Mask ventilation without difficulty and Oral airway inserted - appropriate to patient size Laryngoscope Size: Glidescope and 4 Grade View: Grade I Tube type: Oral Tube size: 7.0 mm Number of attempts: 1 Placement Confirmation: breath sounds checked- equal and bilateral,  positive ETCO2 and ETT inserted through vocal cords under direct vision Secured at: 22 cm Tube secured with: Tape Dental Injury: Teeth and Oropharynx as per pre-operative assessment

## 2016-11-20 DIAGNOSIS — K279 Peptic ulcer, site unspecified, unspecified as acute or chronic, without hemorrhage or perforation: Secondary | ICD-10-CM | POA: Diagnosis not present

## 2016-11-20 DIAGNOSIS — D649 Anemia, unspecified: Secondary | ICD-10-CM | POA: Diagnosis not present

## 2016-11-20 DIAGNOSIS — M50122 Cervical disc disorder at C5-C6 level with radiculopathy: Secondary | ICD-10-CM | POA: Diagnosis not present

## 2016-11-20 DIAGNOSIS — M1990 Unspecified osteoarthritis, unspecified site: Secondary | ICD-10-CM | POA: Diagnosis not present

## 2016-11-20 DIAGNOSIS — M4802 Spinal stenosis, cervical region: Secondary | ICD-10-CM | POA: Diagnosis not present

## 2016-11-20 DIAGNOSIS — M5020 Other cervical disc displacement, unspecified cervical region: Secondary | ICD-10-CM

## 2016-11-20 DIAGNOSIS — K219 Gastro-esophageal reflux disease without esophagitis: Secondary | ICD-10-CM | POA: Diagnosis not present

## 2016-11-20 NOTE — Progress Notes (Signed)
Subjective: 1 Day Post-Op Procedure(s) (LRB): ANTERIOR CERVICAL DISCECTOMY AND FUSION C5-6 WITH PLATES, SCREWS, CAGE, VIVIGEN II (N/A) Patient reports pain as mild.  Soreness in throat region but able to swallow. States overall symptoms improved of neck pain and radicular .   Objective: Vital signs in last 24 hours: Temp:  [97.5 F (36.4 C)-98.6 F (37 C)] 97.7 F (36.5 C) (12/02 0536) Pulse Rate:  [69-89] 78 (12/02 0536) Resp:  [10-20] 16 (12/01 1730) BP: (123-169)/(70-98) 123/73 (12/02 0536) SpO2:  [92 %-99 %] 97 % (12/02 0536) Weight:  [235 lb (106.6 kg)] 235 lb (106.6 kg) (12/01 1047)  Intake/Output from previous day: 12/01 0701 - 12/02 0700 In: 1620 [I.V.:1620] Out: 400 [Urine:300; Blood:100] Intake/Output this shift: No intake/output data recorded.   Recent Labs  11/19/16 1144  HGB 11.8*    Recent Labs  11/19/16 1144  WBC 8.1  RBC 3.92  HCT 38.5  PLT 404*    Recent Labs  11/19/16 1144  NA 140  K 4.1  CL 105  CO2 24  BUN 15  CREATININE 1.17*  GLUCOSE 83  CALCIUM 9.6    Recent Labs  11/19/16 1144  INR 0.98    Neurovascular intact Incision: dressing C/D/I  Assessment/Plan: 1 Day Post-Op Procedure(s) (LRB): ANTERIOR CERVICAL DISCECTOMY AND FUSION C5-6 WITH PLATES, SCREWS, CAGE, VIVIGEN II (N/A) Up with therapy Discharge home with home health  Erskine Emery 11/20/2016, 8:13 AM

## 2016-11-20 NOTE — Evaluation (Signed)
Occupational Therapy Evaluation and Discharge Patient Details Name: Cheryl [name] MRN: [id number] DOB: 08-Apr-1946 Today's Date: 11/20/2016    History of Present Illness Pt is a 70 y.o. female s/p C5-6 ACDF. PMHx: Arthritis, Neuromuscluar disorder, Sleep apnea, Knee sx x2, R rotator cuff repair x2.   Clinical Impression   Pt reports she was independent with ADL PTA. Currently pt overall supervision for ADL and functional mobility with the exception of mod assist for LB ADL; pt reports husband can assist with ADL as needed upon return home. All cervical, safety, and ADL education complete; pt with no further questions or concerns for OT at this time. Pt planning to d/c home with 24/7 supervision from family. No further acute OT needs identified; signing off at this time. Please re-consult if needs change. Thank you for this referral.    Follow Up Recommendations  No OT follow up;Supervision/Assistance - 24 hour (initially)    Equipment Recommendations  None recommended by OT    Recommendations for Other Services       Precautions / Restrictions Precautions Precautions: Cervical Precaution Comments: Educated pt on cervical precautions and provided handout. Required Braces or Orthoses: Cervical Brace Cervical Brace: Soft collar ([place] collar for shower) Restrictions Weight Bearing Restrictions: No      Mobility Bed Mobility Overal bed mobility: Needs Assistance Bed Mobility: Rolling;Sidelying to Sit Rolling: Supervision Sidelying to sit: Supervision       General bed mobility comments: HOB minimally elevated with use of bed rails. VCs for log roll technique.  Transfers Overall transfer level: Needs assistance Equipment used: None Transfers: Sit to/from Stand Sit to Stand: Supervision         General transfer comment: Supervision for safety. No c/o dizziness or unsteadiness noted.    Balance Overall balance assessment: No apparent balance deficits (not  formally assessed)                                          ADL Overall ADL's : Needs assistance/impaired Eating/Feeding: Set up;Sitting   Grooming: Supervision/safety;Standing Grooming Details (indicate cue type and reason): Educatued on use of 2 cups for oral care. Upper Body Bathing: Set up;Supervision/ safety;Sitting   Lower Body Bathing: Minimal assistance;Sit to/from stand   Upper Body Dressing : Set up;Supervision/safety;Sitting   Lower Body Dressing: Minimal assistance;Sit to/from stand Lower Body Dressing Details (indicate cue type and reason): Pt unable to don socks sitting EOB. Educated pt on compensatory strategies for LB ADL. Pt reports husband can assist as needed. Toilet Transfer: Supervision/safety;Ambulation;Comfort height toilet Toilet Transfer Details (indicate cue type and reason): Simulated by sit to stand from EOB with functional mobility in room. Toileting- Clothing Manipulation and Hygiene: Supervision/safety;Sit to/from Nurse, children's Details (indicate cue type and reason): Educated on supervision for safety with tub transfer upon return home and use of shower chair for safety. Functional mobility during ADLs: Supervision/safety General ADL Comments: Educated pt on maintaining cervical precautions during functional activities, no lifting, log roll technique for bed mobility. No philidelphia collar present on eval; educated on proper donning doffing and getting assist as needed.     Vision Vision Assessment?: No apparent visual deficits   Perception     Praxis      Pertinent Vitals/Pain Pain Assessment: Faces Faces Pain Scale: Hurts a little bit Pain Location: head Pain Descriptors / Indicators: Headache Pain Intervention(s): Monitored  during session;Repositioned     Hand Dominance Right   Extremity/Trunk Assessment Upper Extremity Assessment Upper Extremity Assessment: Overall WFL for tasks assessed   Lower  Extremity Assessment Lower Extremity Assessment: Defer to PT evaluation   Cervical / Trunk Assessment Cervical / Trunk Assessment: Other exceptions Cervical / Trunk Exceptions: s/p cervical sx   Communication Communication Communication: No difficulties   Cognition Arousal/Alertness: Awake/alert Behavior During Therapy: WFL for tasks assessed/performed Overall Cognitive Status: Within Functional Limits for tasks assessed                     General Comments       Exercises       Shoulder Instructions      Home Living Family/patient expects to be discharged to:: Private residence Living Arrangements: Spouse/significant other Available Help at Discharge: Family;Available 24 hours/day Type of Home: House Home Access: Stairs to enter [employer] of Steps: 2   Home Layout: One level     Bathroom Shower/Tub: Tub/shower unit Shower/tub characteristics: Curtain Biochemist, clinical: Handicapped height     Home Equipment: Environmental consultant - 2 wheels;Cane - single point;Bedside commode;Shower seat          Prior Functioning/Environment Level of Independence: Independent                 OT Problem List:     OT Treatment/Interventions:      OT Goals(Current goals can be found in the care plan section) Acute Rehab OT Goals Patient Stated Goal: return home today OT Goal Formulation: All assessment and education complete, DC therapy  OT Frequency:     Barriers to D/C:            Co-evaluation              End of Session Equipment Utilized During Treatment: Cervical collar  Activity Tolerance: Patient tolerated treatment well Patient left: in chair;with call bell/phone within reach   Time: 0802-0826 OT Time Calculation (min): 24 min Charges:  OT General Charges $OT Visit: 1 Procedure OT Evaluation $OT Eval Moderate Complexity: 1 Procedure OT Treatments $Self Care/Home Management : 8-22 mins G-Codes: OT G-codes **NOT FOR INPATIENT  CLASS** Functional Assessment Tool Used: Clinical judgement Functional Limitation: Self care Self Care Current Status (L2957): At least 1 percent but less than 20 percent impaired, limited or restricted Self Care Goal Status (M7340): At least 1 percent but less than 20 percent impaired, limited or restricted Self Care Discharge Status [id number]): At least 1 percent but less than 20 percent impaired, limited or restricted   Binnie Kand M.S., OTR/L Pager: [phone]  11/20/2016, 8:39 AM

## 2016-11-20 NOTE — Discharge Summary (Signed)
Patient ID: Cheryl [name] MRN: [id number] DOB/AGE: 07/27/1946 70 y.o.  Admit date: 11/19/2016 Discharge date: 11/20/2016  Admission Diagnoses:  Active Problems:   Herniated cervical intervertebral disc   Discharge Diagnoses:  Same  Past Medical History:  Diagnosis Date  . Acid reflux   . Arthritis   . Neuromuscular disorder ([place])   . Sinus drainage   . Sleep apnea    had surgery to correct    Surgeries: Procedure(s): ANTERIOR CERVICAL DISCECTOMY AND FUSION C5-6 WITH PLATES, SCREWS, CAGE, VIVIGEN II on 11/19/2016   Consultants: PT  Discharged Condition: Improved  Hospital Course: Cheryl [name] is an 70 y.o. female who was admitted 11/19/2016 for operative treatment of<principal problem not specified>. Patient has severe unremitting pain that affects sleep, daily activities, and work/hobbies. After pre-op clearance the patient was taken to the operating room on 11/19/2016 and underwent  Procedure(s): ANTERIOR CERVICAL DISCECTOMY AND FUSION C5-6 WITH PLATES, SCREWS, CAGE, VIVIGEN II.    Patient was given perioperative antibiotics: Anti-infectives    Start     Dose/Rate Route Frequency Ordered Stop   11/19/16 2000  ceFAZolin (ANCEF) IVPB 1 g/50 mL premix     1 g 100 mL/hr over 30 Minutes Intravenous Every 8 hours 11/19/16 1729 11/20/16 0517   11/19/16 1034  ceFAZolin (ANCEF) 2-4 GM/100ML-% IVPB    Comments:  Laurita Quint   : cabinet override      11/19/16 1034 11/19/16 1307   11/19/16 1030  ceFAZolin (ANCEF) IVPB 2g/100 mL premix     2 g 200 mL/hr over 30 Minutes Intravenous On call to O.R. 11/19/16 1030 11/19/16 1307       Patient was given sequential compression devices, early ambulation, and chemoprophylaxis to prevent DVT.  Patient benefited maximally from hospital stay and there were no complications.    Recent vital signs: Patient Vitals for the past 24 hrs:  BP Temp Temp src Pulse Resp SpO2 Height Weight  11/20/16 0536 123/73 97.7 F (36.5 C) Oral 78 - 97 % - -   11/20/16 0001 131/70 97.9 F (36.6 C) Oral 83 - 94 % - -  11/19/16 2120 (!) 156/78 98.6 F (37 C) Oral 89 - 98 % - -  11/19/16 1730 (!) 155/89 97.8 F (36.6 C) - - 16 97 % - -  11/19/16 1702 - 97.8 F (36.6 C) - - - - - -  11/19/16 1700 - - - 74 15 96 % - -  11/19/16 1659 (!) 154/82 - - 80 11 94 % - -  11/19/16 1645 - - - 74 14 96 % - -  11/19/16 1644 (!) 165/79 - - 69 20 97 % - -  11/19/16 1630 - - - 69 16 96 % - -  11/19/16 1629 (!) 154/83 - - 70 14 97 % - -  11/19/16 1615 - - - 80 11 96 % - -  11/19/16 1614 (!) 169/79 - - 78 10 96 % - -  11/19/16 1600 - - - 84 11 99 % - -  11/19/16 1559 (!) 169/98 - - 89 13 99 % - -  11/19/16 1557 (!) 169/98 97.5 F (36.4 C) - 87 - 92 % - -  11/19/16 1047 (!) 152/79 98.1 F (36.7 C) Oral 69 20 97 % 4\' 11"  (1.499 m) 235 lb (106.6 kg)     Recent laboratory studies:  Recent Labs  11/19/16 1144  WBC 8.1  HGB 11.8*  HCT 38.5  PLT 404*  NA  140  K 4.1  CL 105  CO2 24  BUN 15  CREATININE 1.17*  GLUCOSE 83  INR 0.98  CALCIUM 9.6     Discharge Medications:     Medication List    TAKE these medications   acetaminophen 500 MG tablet Commonly known as:  TYLENOL Take 500-1,000 mg by mouth every 6 (six) hours as needed (for pain/headache).   albuterol 108 (90 Base) MCG/ACT inhaler Commonly known as:  PROVENTIL HFA;VENTOLIN HFA Inhale 1 puff into the lungs every 6 (six) hours as needed for wheezing or shortness of breath.   allopurinol 300 MG tablet Commonly known as:  ZYLOPRIM Take 300 mg by mouth at bedtime.   Biotin 5000 MCG Tabs Take 5,000 mcg by mouth daily.   CALCIUM-VITAMIN D PO Take 1 tablet by mouth every morning.   febuxostat 40 MG tablet Commonly known as:  ULORIC Take 40 mg by mouth at bedtime.   finasteride 5 MG tablet Commonly known as:  PROSCAR Take 1 mg by mouth at bedtime.   fluticasone 50 MCG/ACT nasal spray Commonly known as:  FLONASE Place 1 spray into both nostrils daily as needed for allergies  (sinuses).   gabapentin 100 MG capsule Commonly known as:  NEURONTIN Take 100 mg by mouth at bedtime as needed (for pain).   HYDROcodone-acetaminophen 5-325 MG tablet Commonly known as:  NORCO/VICODIN Take 1 tablet by mouth every 6 (six) hours as needed. What changed:  reasons to take this   HYDROcodone-acetaminophen 5-325 MG tablet Commonly known as:  NORCO/VICODIN Take 1-2 tablets by mouth every 4 (four) hours as needed (mild pain). What changed:  You were already taking a medication with the same name, and this prescription was added. Make sure you understand how and when to take each.   linaclotide 145 MCG Caps capsule Commonly known as:  LINZESS Take 1 capsule (145 mcg total) by mouth daily before breakfast. What changed:  when to take this   methocarbamol 500 MG tablet Commonly known as:  ROBAXIN Take 1 tablet (500 mg total) by mouth every 8 (eight) hours as needed for muscle spasms.   montelukast 10 MG tablet Commonly known as:  SINGULAIR Take 10 mg by mouth every morning.   pantoprazole 40 MG tablet Commonly known as:  PROTONIX Take 1 tablet (40 mg total) by mouth 2 (two) times daily. What changed:  when to take this   raloxifene 60 MG tablet Commonly known as:  EVISTA Take 60 mg by mouth daily.   tolterodine 1 MG tablet Commonly known as:  DETROL Take 1 mg by mouth 2 (two) times daily.   vitamin B-12 1000 MCG tablet Commonly known as:  CYANOCOBALAMIN Take 1,000 mcg by mouth daily.       Diagnostic Studies: Dg Chest 2 View  Result Date: 11/19/2016 CLINICAL DATA:  Preop for ACDF surgery today. Productive cough for 2 months. EXAM: CHEST  2 VIEW COMPARISON:  Thoracic spine radiographs 03/31/2016. Chest x-ray 10/13/2013. FINDINGS: Borderline cardiac enlargement is present. Chronic interstitial coarsening is present bilaterally. Areas of linear atelectasis or scarring are present at both lung bases. Minimal ill-defined opacity is present in the right upper lobe.  IMPRESSION: 1. Low lung volumes areas of linear atelectasis or scarring at the bases bilaterally. 2. Minimal ill-defined opacity at the right apex likely reflects atelectasis is well. Early infection is not excluded. Recommend follow-up two-view chest x-ray to assure clearing. Electronically Signed   By: San Morelle M.D.   On: 11/19/2016 11:19  Dg Cervical Spine 1 View  Result Date: 11/19/2016 CLINICAL DATA:  C5-C6 ACDF EXAM: DG C-ARM 61-120 MIN; DG CERVICAL SPINE - 1 VIEW COMPARISON:  MR cervical spine 07/23/2016 FLUOROSCOPY TIME:  0 minutes 4 seconds Images obtained: 1 FINDINGS: Single cross-table lateral view of the cervical spine obtained intraoperatively. Diffuse osseous demineralization. Anterior plate and screws at O6-V6 with intervening disc prosthesis. No acute bony abnormalities identified. IMPRESSION: Anterior fusion C5-C6. Electronically Signed   By: Lavonia Dana M.D.   On: 11/19/2016 15:45   Dg C-arm 1-60 Min  Result Date: 11/19/2016 CLINICAL DATA:  C5-C6 ACDF EXAM: DG C-ARM 61-120 MIN; DG CERVICAL SPINE - 1 VIEW COMPARISON:  MR cervical spine 07/23/2016 FLUOROSCOPY TIME:  0 minutes 4 seconds Images obtained: 1 FINDINGS: Single cross-table lateral view of the cervical spine obtained intraoperatively. Diffuse osseous demineralization. Anterior plate and screws at H2-C9 with intervening disc prosthesis. No acute bony abnormalities identified. IMPRESSION: Anterior fusion C5-C6. Electronically Signed   By: Lavonia Dana M.D.   On: 11/19/2016 15:45    Disposition: 01-Home or Self Care  Discharge Instructions    Call MD / Call 911    Complete by:  As directed    If you experience chest pain or shortness of breath, CALL 911 and be transported to the hospital emergency room.  If you develope a fever above 101 F, pus (white drainage) or increased drainage or redness at the wound, or calf pain, call your surgeon's office.   Constipation Prevention    Complete by:  As directed    Drink  plenty of fluids.  Prune juice may be helpful.  You may use a stool softener, such as Colace (over the counter) 100 mg twice a day.  Use MiraLax (over the counter) for constipation as needed.   Diet - low sodium heart healthy    Complete by:  As directed    Discharge instructions    Complete by:  As directed    No lifting greater than 10 lbs. No overhead use of arms. Avoid bending,and twisting neck. Walk in house for first week them may start to get out slowly increasing distance up to one quarter mile by 3 weeks post op. Keep incision dry for 3 days, may then bathe and wet incision using a [place] collar when showering. Call if any fevers >101, chills, or increasing numbness or weakness or increased swelling or drainage.   Driving restrictions    Complete by:  As directed    No driving for 4 weeks   Increase activity slowly as tolerated    Complete by:  As directed    Lifting restrictions    Complete by:  As directed    No lifting for 8 weeks      Follow-up Information    Jessy Oto, MD In 2 weeks.   Specialty:  Orthopedic Surgery Why:  For wound re-check Contact information: [place] [address] [phone]            Signed: Erskine Emery 11/20/2016, 9:11 AM

## 2016-11-20 NOTE — Progress Notes (Signed)
Orthopedic Tech Progress Note Patient Details:  REAGEN GOATES 1946/11/05 [id number]  Ortho Devices Type of Ortho Device: Soft collar Ortho Device/Splint Location: Provided and Fitted Research scientist (physical sciences) to Neck of Pt.  Nursed Advise [place] Collar needed for pt to shower at home. Orders are located in Other orders. Pt Stated that familly will assist with collar at home. Ortho Device/Splint Interventions: Application, Adjustment   Kristopher Oppenheim 11/20/2016, 10:21 AM

## 2016-11-20 NOTE — Evaluation (Signed)
Physical Therapy Evaluation Patient Details Name: SOPHI CALLIGAN MRN: [id number] DOB: 1946-07-30 Today's Date: 11/20/2016   History of Present Illness  Pt is a 70 y.o. female s/p C5-6 ACDF. PMHx: Arthritis, Neuromuscluar disorder, Sleep apnea, Knee sx x2, R rotator cuff repair x2.  Clinical Impression  Pt is POD 1 and moving well with therapy. Prior to admission, Pt was completely independent and works as a part time Recruitment consultant. Pt is able to perform all functional mobility with supervision this session. Performed gait without AD and no LOB and was able to ascend and descend 2 steps without assistance. Pt was observed using restroom without assistance. All education and instruction was complete this session and no further PT follow-up is required at this time.     Follow Up Recommendations No PT follow up    Equipment Recommendations  None recommended by PT    Recommendations for Other Services       Precautions / Restrictions Precautions Precautions: Cervical Precaution Comments: Educated pt on cervical precautions and provided handout. Required Braces or Orthoses: Cervical Brace Cervical Brace: Soft collar Restrictions Weight Bearing Restrictions: No      Mobility  Bed Mobility Overal bed mobility: Needs Assistance Bed Mobility: Rolling;Sit to Sidelying Rolling: Supervision Sidelying to sit: Supervision       General bed mobility comments: HOB minimally elevated with use of bed rails. VCs for log roll technique.  Transfers Overall transfer level: Needs assistance Equipment used: None Transfers: Sit to/from Stand Sit to Stand: Supervision         General transfer comment: Supervision for safety. No c/o dizziness or unsteadiness noted.  Ambulation/Gait Ambulation/Gait assistance: Supervision Ambulation Distance (Feet): 250 Feet Assistive device: None Gait Pattern/deviations: Step-through pattern Gait velocity: decreased Gait velocity interpretation: Below  normal speed for age/gender General Gait Details: No LOB with gait with supervision on even surfaces. Able to use bathroom without assistance  Stairs            Wheelchair Mobility    Modified Rankin (Stroke Patients Only)       Balance Overall balance assessment: No apparent balance deficits (not formally assessed)                                           Pertinent Vitals/Pain Pain Assessment: 0-10 Pain Score: 5  Faces Pain Scale: Hurts a little bit Pain Location: Head Pain Descriptors / Indicators: Headache Pain Intervention(s): Monitored during session;Premedicated before session    Home Living Family/patient expects to be discharged to:: Private residence Living Arrangements: Spouse/significant other Available Help at Discharge: Family;Available 24 hours/day Type of Home: House Home Access: Stairs to enter Entrance Stairs-Rails: Psychiatric nurse of Steps: 2 Home Layout: One level Home Equipment: Walker - 2 wheels;Cane - single point;Bedside commode;Shower seat      Prior Function Level of Independence: Independent         Comments: school bus driver part time     Hand Dominance   Dominant Hand: Right    Extremity/Trunk Assessment   Upper Extremity Assessment: Defer to OT evaluation           Lower Extremity Assessment: Defer to PT evaluation      Cervical / Trunk Assessment: Other exceptions  Communication   Communication: No difficulties  Cognition Arousal/Alertness: Awake/alert Behavior During Therapy: WFL for tasks assessed/performed Overall Cognitive Status: Within Functional Limits for tasks  assessed                      General Comments      Exercises     Assessment/Plan    PT Assessment Patent does not need any further PT services  PT Problem List            PT Treatment Interventions      PT Goals (Current goals can be found in the Care Plan section)  Acute Rehab PT  Goals Patient Stated Goal: return home today PT Goal Formulation: With patient Time For Goal Achievement: 11/27/16 Potential to Achieve Goals: Good    Frequency     Barriers to discharge        Co-evaluation               End of Session Equipment Utilized During Treatment: Gait belt;Cervical collar Activity Tolerance: Patient tolerated treatment well Patient left: in chair;with call bell/phone within reach Nurse Communication: Mobility status    Functional Assessment Tool Used: gait analysis, functional mobility assessment Functional Limitation: Mobility: Walking and moving around Mobility: Walking and Moving Around Current Status ([id number]): At least 1 percent but less than 20 percent impaired, limited or restricted Mobility: Walking and Moving Around Goal Status [phone]): At least 1 percent but less than 20 percent impaired, limited or restricted Mobility: Walking and Moving Around Discharge Status [phone]): At least 1 percent but less than 20 percent impaired, limited or restricted    Time: 0272-5366 PT Time Calculation (min) (ACUTE ONLY): 19 min   Charges:   PT Evaluation $PT Eval Low Complexity: 1 Procedure     PT G Codes:   PT G-Codes **NOT FOR INPATIENT CLASS** Functional Assessment Tool Used: gait analysis, functional mobility assessment Functional Limitation: Mobility: Walking and moving around Mobility: Walking and Moving Around Current Status ([id number]): At least 1 percent but less than 20 percent impaired, limited or restricted Mobility: Walking and Moving Around Goal Status [phone]): At least 1 percent but less than 20 percent impaired, limited or restricted Mobility: Walking and Moving Around Discharge Status [phone]): At least 1 percent but less than 20 percent impaired, limited or restricted    Scheryl Marten PT, DPT  [phone]  11/20/2016, 10:12 AM

## 2016-11-24 ENCOUNTER — Encounter (HOSPITAL_COMMUNITY): Payer: Self-pay | Admitting: Specialist

## 2016-11-24 MED FILL — Thrombin For Soln 20000 Unit: CUTANEOUS | Qty: 1 | Status: AC

## 2016-12-02 ENCOUNTER — Encounter (INDEPENDENT_AMBULATORY_CARE_PROVIDER_SITE_OTHER): Payer: Self-pay | Admitting: Specialist

## 2016-12-02 ENCOUNTER — Ambulatory Visit (INDEPENDENT_AMBULATORY_CARE_PROVIDER_SITE_OTHER): Payer: Medicare Other

## 2016-12-02 ENCOUNTER — Ambulatory Visit (INDEPENDENT_AMBULATORY_CARE_PROVIDER_SITE_OTHER): Payer: Medicare Other | Admitting: Specialist

## 2016-12-02 VITALS — Ht 59.0 in | Wt 235.0 lb

## 2016-12-02 DIAGNOSIS — M542 Cervicalgia: Secondary | ICD-10-CM

## 2016-12-02 NOTE — Progress Notes (Addendum)
Post-Op Visit Note   Patient: Cheryl [name]           Date of Birth: 10-Sep-1946           MRN: [id number] Visit Date: 12/02/2016 PCP: Purvis Kilts, MD   Assessment & Plan:  Chief Complaint:  Patient returns 2 weeks post op from anterior cervical discectomy and fusion C5-6 with plates, screws, and cage.States not having much pain. Still having some numbness in right finger tips. Wearing collar. Visit Diagnoses:  1. Neck pain     Plan: Continue wearing cervical collar until 6 week postop. Avoid lifting or bending neck. No driving. Follow up in 4 weeks for recheck. Okay to discontinue gabapentin.  Follow-Up Instructions: Return in about 4 weeks (around 12/30/2016).   Orders:  Orders Placed This Encounter  Procedures  . XR Cervical Spine 2 or 3 views   No orders of the defined types were placed in this encounter.    PMFS History: Patient Active Problem List   Diagnosis Date Noted  . Herniated cervical intervertebral disc 11/19/2016  . Cervical disc disorder at C5-C6 level with radiculopathy 10/21/2016  . Gastric ulceration   . GERD (gastroesophageal reflux disease) 05/11/2016  . Constipation 05/11/2016  . Hashimoto's thyroiditis 02/20/2016  . History of colonic polyps   . Diverticulosis of colon without hemorrhage   . Complete tear of right rotator cuff 01/21/2015  . Severe obesity (BMI >= 40) ([place]) 01/21/2015  . Nausea alone 04/04/2013  . H/O adenomatous polyp of colon 04/04/2013  . Fatty liver 11/11/2010  . RUQ PAIN 11/11/2010  . NONSPEC ELEVATION OF LEVELS OF TRANSAMINASE/LDH 11/11/2010  . COLONIC POLYPS, HYPERPLASTIC, HX OF 06/25/2010  . ANEMIA-NOS 06/24/2010   Past Medical History:  Diagnosis Date  . Acid reflux   . Arthritis   . Neuromuscular disorder ([place])   . Sinus drainage   . Sleep apnea    had surgery to correct    Family History  Problem Relation Age of Onset  . Cancer Mother   . Cancer Father   . Diabetes Sister   . Colon cancer Neg Hx    . Liver disease Neg Hx     Past Surgical History:  Procedure Laterality Date  . ANTERIOR CERVICAL DECOMP/DISCECTOMY FUSION N/A 11/19/2016   Procedure: ANTERIOR CERVICAL DISCECTOMY AND FUSION C5-6 WITH PLATES, SCREWS, CAGE, VIVIGEN II;  Surgeon: Jessy Oto, MD;  Location: [facility];  Service: Orthopedics;  Laterality: N/A;  . CARPAL TUNNEL RELEASE    . COLONOSCOPY  02/15/2005   XBD:ZHGDJ small polyps ablated via cold biopsy, one from transverse colon and two from the rectum/Small external hemorrhoids  . COLONOSCOPY  07/10/2010   MEQ:ASTMHD rectum/long redundant colon, polyps in the sigmoid, descending, hepatic flexure/ADENOMATOUS POLYPS. next TCS due 06/2015  . COLONOSCOPY  1995   3 polyps, path revealed chronic colitis  . COLONOSCOPY N/A 08/05/2015   Procedure: COLONOSCOPY;  Surgeon: Daneil Dolin, MD;  [facility];  Service: Endoscopy;  Laterality: N/A;  1000  . ESOPHAGOGASTRODUODENOSCOPY  1995   Gastritis  . ESOPHAGOGASTRODUODENOSCOPY N/A 04/16/2013   RMR: HH  . ESOPHAGOGASTRODUODENOSCOPY N/A 05/13/2016   Procedure: ESOPHAGOGASTRODUODENOSCOPY (EGD);  Surgeon: Daneil Dolin, MD;  [facility];  Service: Endoscopy;  Laterality: N/A;  145  . ESOPHAGOGASTRODUODENOSCOPY N/A 08/18/2016   Procedure: ESOPHAGOGASTRODUODENOSCOPY (EGD);  Surgeon: Daneil Dolin, MD;  [facility];  Service: Endoscopy;  Laterality: N/A;  7:45 AM  . KNEE SURGERY  x2  . ROTATOR CUFF REPAIR     x2  . SHOULDER ARTHROSCOPY WITH ROTATOR CUFF REPAIR AND SUBACROMIAL DECOMPRESSION Right 01/21/2015   Procedure: RIGHT SHOULDER ARTHROSCOPIC DEBRIDEMNT OF G-H JOINT AND REMOVAL OF LOOSE BODIES,ARTHROSCOPIC SUBACROMIAL DECOMPRESSION,MINI OPEN RCT REPAIR WITH SUPPLEMENTAL [facility] PATCH;  Surgeon: Garald Balding, MD;  Location: [facility];  Service: Orthopedics;  Laterality: Right;  . TONSILLECTOMY    . TRIGGER FINGER RELEASE    . UVULOPALATOPHARYNGOPLASTY     Social History   Occupational  History  . [employer]   Social History Main Topics  . Smoking status: Former Smoker    Years: 48.00    Types: Cigarettes    Quit date: 05/11/2005  . Smokeless tobacco: Never Used  . Alcohol use No  . Drug use: No  . Sexual activity: Not on file   Exam Wound looks good. No drainage or signs of infection. She is neurovascularly intact. Skin warm and dry. No motor deficits.

## 2016-12-02 NOTE — Patient Instructions (Signed)
Must continue cervical collar until follow-up visit 6 weeks postop. Do not bend her neck. Absolutely no driving until we see her back at 6 week postop appointment.

## 2016-12-03 ENCOUNTER — Telehealth (INDEPENDENT_AMBULATORY_CARE_PROVIDER_SITE_OTHER): Payer: Self-pay | Admitting: Specialist

## 2016-12-03 NOTE — Telephone Encounter (Signed)
NO contraindication to having eye surgery.

## 2016-12-03 NOTE — Telephone Encounter (Signed)
See message below please advise.  Thanks.

## 2016-12-03 NOTE — Telephone Encounter (Signed)
Patient called advised she is having eye surgery Monday and asked if it is still ok to have the surgery. Patient said her eye surgery is being done by Dr Katy Fitch.  The number to contact patient is [phone]

## 2016-12-03 NOTE — Telephone Encounter (Signed)
Noted, called patient and advised Thanks.

## 2016-12-06 DIAGNOSIS — H25812 Combined forms of age-related cataract, left eye: Secondary | ICD-10-CM | POA: Diagnosis not present

## 2016-12-06 DIAGNOSIS — H2512 Age-related nuclear cataract, left eye: Secondary | ICD-10-CM | POA: Diagnosis not present

## 2016-12-06 DIAGNOSIS — H25012 Cortical age-related cataract, left eye: Secondary | ICD-10-CM | POA: Diagnosis not present

## 2016-12-16 DIAGNOSIS — L668 Other cicatricial alopecia: Secondary | ICD-10-CM | POA: Diagnosis not present

## 2016-12-16 DIAGNOSIS — L218 Other seborrheic dermatitis: Secondary | ICD-10-CM | POA: Diagnosis not present

## 2016-12-28 DIAGNOSIS — H2511 Age-related nuclear cataract, right eye: Secondary | ICD-10-CM | POA: Diagnosis not present

## 2017-01-03 DIAGNOSIS — H25811 Combined forms of age-related cataract, right eye: Secondary | ICD-10-CM | POA: Diagnosis not present

## 2017-01-03 DIAGNOSIS — H2511 Age-related nuclear cataract, right eye: Secondary | ICD-10-CM | POA: Diagnosis not present

## 2017-01-06 ENCOUNTER — Ambulatory Visit (INDEPENDENT_AMBULATORY_CARE_PROVIDER_SITE_OTHER): Payer: Medicare Other | Admitting: Specialist

## 2017-01-07 ENCOUNTER — Telehealth (INDEPENDENT_AMBULATORY_CARE_PROVIDER_SITE_OTHER): Payer: Self-pay

## 2017-01-07 NOTE — Telephone Encounter (Signed)
Pt called back about this.

## 2017-01-07 NOTE — Telephone Encounter (Signed)
Patient called and states she had SU on 11/19/16- (post op from anterior cervical discectomy and fusion C5-6 with plates, screws, and cage). She needs a post op f/u appt around 12/30/16 per Jn. Probably need to open a slot up for this PO patient. Call patient back at [phone]

## 2017-01-10 NOTE — Telephone Encounter (Signed)
Scheduled for 01/13/17 @ 845am,

## 2017-01-13 ENCOUNTER — Ambulatory Visit (INDEPENDENT_AMBULATORY_CARE_PROVIDER_SITE_OTHER): Payer: Medicare Other | Admitting: Specialist

## 2017-01-13 ENCOUNTER — Encounter (INDEPENDENT_AMBULATORY_CARE_PROVIDER_SITE_OTHER): Payer: Self-pay | Admitting: Specialist

## 2017-01-13 ENCOUNTER — Ambulatory Visit (INDEPENDENT_AMBULATORY_CARE_PROVIDER_SITE_OTHER): Payer: Medicare Other

## 2017-01-13 VITALS — BP 143/81 | HR 78 | Ht 59.0 in | Wt 265.0 lb

## 2017-01-13 DIAGNOSIS — Z981 Arthrodesis status: Secondary | ICD-10-CM

## 2017-01-13 DIAGNOSIS — M542 Cervicalgia: Secondary | ICD-10-CM

## 2017-01-13 NOTE — Progress Notes (Addendum)
Post-Op Visit Note   Patient: Cheryl [name]           Date of Birth: [birth year]           MRN: [id number] Visit Date: 01/13/2017 PCP: Purvis Kilts, MD   Assessment & Plan: 8 weeks post C5-6 ACDF, doing well,allow to return to driving in 3-4 weeks.  Chief Complaint:  [name] is 7 weeks, 6 days post ANTERIOR CERVICAL DISCECTOMY AND FUSION C5-6 WITH PLATES, SCREWS, CAGE, VIVIGEN Ii, she states that she is doing well no problems.  Has some intermittant  Numbness in the right thumb and across the shoulders into the anterior shoulder area. No bowel or bladder difficulties. Standing and walking without difficulty. Left anterior neck incision is healing well. Removed the dermabond from the skin without difficulty.  Visit Diagnoses:  1. Neck pain   2. Status post cervical spinal fusion   Incision left neck without erythrema or drainage or fluctuance, it is healed. Upper extremity motor is normal.  AP and Lateral flexion and extension radiographs.These show the plate and screws fixing the anterior H4-7 level, a metallic cage within the disc space at C5-6 with subsidence into the superior endplate of C6, flexion and extension radiographs show no significant motion, 15.6 and 15.8 mm  Less than 31mm acceptable for healing of the fusion area.  Plan:    No lifting greater than 10 lbs. No overhead use of arms. Avoid bending,and twisting neck. Slowly increase walking distance up to one mile per day Follow-Up Instructions: Return in about 4 weeks (around 02/10/2017).   Orders:  Orders Placed This Encounter  Procedures  . XR Cervical Spine 2 or 3 views   No orders of the defined types were placed in this encounter.   Imaging: Xr Cervical Spine 2 Or 3 Views  Result Date: 01/13/2017 AP and Lateral flexion and extension radiographs.These show the plate and screws fixing the anterior M5-4 level, a metallic cage within the disc space at C5-6 with subsidence into the superior endplate of C6,  flexion and extension radiographs show no significant motion, 15.6 and 15.8 mm  Less than 4mm acceptable for healing of the fusion area.    PMFS History: Patient Active Problem List   Diagnosis Date Noted  . Cervical disc disorder at C5-C6 level with radiculopathy 10/21/2016    Priority: High  . Herniated cervical intervertebral disc 11/19/2016  . Gastric ulceration   . GERD (gastroesophageal reflux disease) 05/11/2016  . Constipation 05/11/2016  . Hashimoto's thyroiditis 02/20/2016  . History of colonic polyps   . Diverticulosis of colon without hemorrhage   . Complete tear of right rotator cuff 01/21/2015  . Severe obesity (BMI >= 40) ([place]) 01/21/2015  . Nausea alone 04/04/2013  . H/O adenomatous polyp of colon 04/04/2013  . Fatty liver 11/11/2010  . RUQ PAIN 11/11/2010  . NONSPEC ELEVATION OF LEVELS OF TRANSAMINASE/LDH 11/11/2010  . COLONIC POLYPS, HYPERPLASTIC, HX OF 06/25/2010  . ANEMIA-NOS 06/24/2010   Past Medical History:  Diagnosis Date  . Acid reflux   . Arthritis   . Neuromuscular disorder ([place])   . Sinus drainage   . Sleep apnea    had surgery to correct    Family History  Problem Relation Age of Onset  . Cancer Mother   . Cancer Father   . Diabetes Sister   . Colon cancer Neg Hx   . Liver disease Neg Hx     Past Surgical History:  Procedure Laterality Date  .  ANTERIOR CERVICAL DECOMP/DISCECTOMY FUSION N/A 11/19/2016   Procedure: ANTERIOR CERVICAL DISCECTOMY AND FUSION C5-6 WITH PLATES, SCREWS, CAGE, VIVIGEN II;  Surgeon: Jessy Oto, MD;  Location: [facility];  Service: Orthopedics;  Laterality: N/A;  . CARPAL TUNNEL RELEASE    . COLONOSCOPY  02/15/2005   QPR:FFMBW small polyps ablated via cold biopsy, one from transverse colon and two from the rectum/Small external hemorrhoids  . COLONOSCOPY  07/10/2010   GYK:ZLDJTT rectum/long redundant colon, polyps in the sigmoid, descending, hepatic flexure/ADENOMATOUS POLYPS. next TCS due 06/2015  . COLONOSCOPY  1995    3 polyps, path revealed chronic colitis  . COLONOSCOPY N/A 08/05/2015   Procedure: COLONOSCOPY;  Surgeon: Daneil Dolin, MD;  [facility];  Service: Endoscopy;  Laterality: N/A;  1000  . ESOPHAGOGASTRODUODENOSCOPY  1995   Gastritis  . ESOPHAGOGASTRODUODENOSCOPY N/A 04/16/2013   RMR: HH  . ESOPHAGOGASTRODUODENOSCOPY N/A 05/13/2016   Procedure: ESOPHAGOGASTRODUODENOSCOPY (EGD);  Surgeon: Daneil Dolin, MD;  [facility];  Service: Endoscopy;  Laterality: N/A;  145  . ESOPHAGOGASTRODUODENOSCOPY N/A 08/18/2016   Procedure: ESOPHAGOGASTRODUODENOSCOPY (EGD);  Surgeon: Daneil Dolin, MD;  [facility];  Service: Endoscopy;  Laterality: N/A;  7:45 AM  . KNEE SURGERY     x2  . ROTATOR CUFF REPAIR     x2  . SHOULDER ARTHROSCOPY WITH ROTATOR CUFF REPAIR AND SUBACROMIAL DECOMPRESSION Right 01/21/2015   Procedure: RIGHT SHOULDER ARTHROSCOPIC DEBRIDEMNT OF G-H JOINT AND REMOVAL OF LOOSE BODIES,ARTHROSCOPIC SUBACROMIAL DECOMPRESSION,MINI OPEN RCT REPAIR WITH SUPPLEMENTAL [facility] PATCH;  Surgeon: Garald Balding, MD;  Location: [facility];  Service: Orthopedics;  Laterality: Right;  . TONSILLECTOMY    . TRIGGER FINGER RELEASE    . UVULOPALATOPHARYNGOPLASTY     Social History   Occupational History  . [employer]   Social History Main Topics  . Smoking status: Former Smoker    Years: 48.00    Types: Cigarettes    Quit date: 05/11/2005  . Smokeless tobacco: Never Used  . Alcohol use No  . Drug use: No  . Sexual activity: Not on file

## 2017-01-13 NOTE — Patient Instructions (Signed)
Plan:    No lifting greater than 10 lbs. No overhead use of arms. Avoid bending,and twisting neck. Slowly increase walking distance up to one mile per day Follow-Up Instructions: No Follow-up on file.

## 2017-01-19 ENCOUNTER — Telehealth (INDEPENDENT_AMBULATORY_CARE_PROVIDER_SITE_OTHER): Payer: Self-pay | Admitting: Specialist

## 2017-01-19 NOTE — Telephone Encounter (Signed)
I called and advised that letter has been mailed to her

## 2017-01-19 NOTE — Telephone Encounter (Signed)
Pt requesting a BTW note for the 16th of Feb  She asked if we can mail this   [phone] requested a call once mailed

## 2017-02-15 ENCOUNTER — Ambulatory Visit (INDEPENDENT_AMBULATORY_CARE_PROVIDER_SITE_OTHER): Payer: Medicare Other | Admitting: Nurse Practitioner

## 2017-02-15 ENCOUNTER — Encounter: Payer: Self-pay | Admitting: Nurse Practitioner

## 2017-02-15 VITALS — BP 137/77 | HR 74 | Temp 98.0°F | Ht 59.0 in | Wt 240.4 lb

## 2017-02-15 DIAGNOSIS — K219 Gastro-esophageal reflux disease without esophagitis: Secondary | ICD-10-CM | POA: Diagnosis not present

## 2017-02-15 DIAGNOSIS — K5900 Constipation, unspecified: Secondary | ICD-10-CM | POA: Diagnosis not present

## 2017-02-15 MED ORDER — LINACLOTIDE 290 MCG PO CAPS: 290.0000 ug | ORAL_CAPSULE | Freq: Every day | ORAL | 3 refills | Status: DC

## 2017-02-15 NOTE — Progress Notes (Signed)
Referring Provider: Sharilyn Sites, MD Primary Care Physician:  Purvis Kilts, MD Primary GI:  Dr. Gala Romney  Chief Complaint  Patient presents with  . Constipation    at times    HPI:   Cheryl [name] is a 71 y.o. female who presents For follow-up on constipation. The patient was last seen in our office 08/11/2016 for the same as well as gastric ulcer. Previous he tried Linzess 72 g which was initially effective but lost effectiveness. She was started on 145 mg dose which noted to be effective. At the time of her last visit her constipation was resolved with regular bowel movements and no straining, GERD improvement on PPI twice a day. No other GI symptoms. Previous endoscopy prior to her last visit dated 04/23/2016 found multiple 1-2 mm antral erosions, multiple 3 mm erosions, hiatal hernia, patent pylorus, normal duodenum. Biopsy showed likely erosive gastropathy without H. pylori.   She was scheduled for 3 month surveillance EGD. This was completed 08/18/2016 found normal esophagus, normal duodenum, small hiatal hernia, previous gastric ulcers completely healed. Recommended continue present medications and follow up in our office 6 months.  Today she states she's doing ok. Was previously having a bowel movement once a day. Now has less frequent BMs, sensation of incomplete emptying and requires straining. Has had no diarrhea ADEs with Linzess. Has hemorrhoids, not currently having flare symptoms of hemorrhoids. GERD symptoms well controlled on PPI. Denies hematochezia, melena. Denies chest pain, dyspnea, dizziness, lightheadedness, syncope, near syncope. Denies any other upper or lower GI symptoms.  Past Medical History:  Diagnosis Date  . Acid reflux   . Arthritis   . Neuromuscular disorder ([place])   . Sinus drainage   . Sleep apnea    had surgery to correct    Past Surgical History:  Procedure Laterality Date  . ANTERIOR CERVICAL DECOMP/DISCECTOMY FUSION N/A 11/19/2016   Procedure: ANTERIOR CERVICAL DISCECTOMY AND FUSION C5-6 WITH PLATES, SCREWS, CAGE, VIVIGEN II;  Surgeon: Jessy Oto, MD;  Location: [place];  Service: Orthopedics;  Laterality: N/A;  . CARPAL TUNNEL RELEASE    . CATARACT EXTRACTION, BILATERAL Bilateral 2017   One in December 2017 and one in January 2018  . COLONOSCOPY  02/15/2005   HBZ:JIRCV small polyps ablated via cold biopsy, one from transverse colon and two from the rectum/Small external hemorrhoids  . COLONOSCOPY  07/10/2010   ELF:YBOFBP rectum/long redundant colon, polyps in the sigmoid, descending, hepatic flexure/ADENOMATOUS POLYPS. next TCS due 06/2015  . COLONOSCOPY  1995   3 polyps, path revealed chronic colitis  . COLONOSCOPY N/A 08/05/2015   Procedure: COLONOSCOPY;  Surgeon: Daneil Dolin, MD;  [facility];  Service: Endoscopy;  Laterality: N/A;  1000  . ESOPHAGOGASTRODUODENOSCOPY  1995   Gastritis  . ESOPHAGOGASTRODUODENOSCOPY N/A 04/16/2013   RMR: HH  . ESOPHAGOGASTRODUODENOSCOPY N/A 05/13/2016   Procedure: ESOPHAGOGASTRODUODENOSCOPY (EGD);  Surgeon: Daneil Dolin, MD;  [facility];  Service: Endoscopy;  Laterality: N/A;  145  . ESOPHAGOGASTRODUODENOSCOPY N/A 08/18/2016   Procedure: ESOPHAGOGASTRODUODENOSCOPY (EGD);  Surgeon: Daneil Dolin, MD;  [facility];  Service: Endoscopy;  Laterality: N/A;  7:45 AM  . KNEE SURGERY     x2  . ROTATOR CUFF REPAIR     x2  . SHOULDER ARTHROSCOPY WITH ROTATOR CUFF REPAIR AND SUBACROMIAL DECOMPRESSION Right 01/21/2015   Procedure: RIGHT SHOULDER ARTHROSCOPIC DEBRIDEMNT OF G-H JOINT AND REMOVAL OF LOOSE BODIES,ARTHROSCOPIC SUBACROMIAL DECOMPRESSION,MINI OPEN RCT REPAIR WITH SUPPLEMENTAL [facility]  PATCH;  Surgeon: Garald Balding, MD;  Location: [facility];  Service: Orthopedics;  Laterality: Right;  . TONSILLECTOMY    . TRIGGER FINGER RELEASE    . UVULOPALATOPHARYNGOPLASTY      Current Outpatient Prescriptions  Medication Sig Dispense Refill  .  acetaminophen (TYLENOL) 500 MG tablet Take 500-1,000 mg by mouth every 6 (six) hours as needed (for pain/headache).    Marland Kitchen albuterol (PROVENTIL HFA;VENTOLIN HFA) 108 (90 BASE) MCG/ACT inhaler Inhale 1 puff into the lungs every 6 (six) hours as needed for wheezing or shortness of breath.    . allopurinol (ZYLOPRIM) 300 MG tablet Take 300 mg by mouth at bedtime.    . Biotin 5000 MCG TABS Take 5,000 mcg by mouth daily.    Marland Kitchen CALCIUM-VITAMIN D PO Take 1 tablet by mouth every morning.    . febuxostat (ULORIC) 40 MG tablet Take 40 mg by mouth at bedtime.     . finasteride (PROSCAR) 5 MG tablet Take 1 mg by mouth at bedtime.    . fluticasone (FLONASE) 50 MCG/ACT nasal spray Place 1 spray into both nostrils daily as needed for allergies (sinuses).    . gabapentin (NEURONTIN) 100 MG capsule Take 100 mg by mouth at bedtime as needed (for pain).    Marland Kitchen linaclotide (LINZESS) 145 MCG CAPS capsule Take 1 capsule (145 mcg total) by mouth daily before breakfast. (Patient taking differently: Take 145 mcg by mouth at bedtime. ) 30 capsule 11  . methocarbamol (ROBAXIN) 500 MG tablet Take 1 tablet (500 mg total) by mouth every 8 (eight) hours as needed for muscle spasms. 40 tablet 1  . montelukast (SINGULAIR) 10 MG tablet Take 10 mg by mouth every morning.    . pantoprazole (PROTONIX) 40 MG tablet Take 1 tablet (40 mg total) by mouth 2 (two) times daily. (Patient taking differently: Take 40 mg by mouth daily. ) 60 tablet 3  . raloxifene (EVISTA) 60 MG tablet Take 60 mg by mouth daily.    Marland Kitchen tolterodine (DETROL) 1 MG tablet Take 1 mg by mouth 2 (two) times daily.    . vitamin B-12 (CYANOCOBALAMIN) 1000 MCG tablet Take 1,000 mcg by mouth daily.     No current facility-administered medications for this visit.     Allergies as of 02/15/2017 - Review Complete 02/15/2017  Allergen Reaction Noted  . Prednisone Other (See Comments) 04/04/2013  . Tramadol Nausea Only 01/21/2015  . Aspirin Nausea And Vomiting   . Celecoxib  Rash     Family History  Problem Relation Age of Onset  . Cancer Mother   . Cancer Father   . Diabetes Sister   . Colon cancer Neg Hx   . Liver disease Neg Hx     Social History   Social History  . Marital status: Married    Spouse name: N/A  . Number of children: 2  . Years of education: N/A   Occupational History  . [place]   Social History Main Topics  . Smoking status: Former Smoker    Years: 48.00    Types: Cigarettes    Quit date: 05/11/2005  . Smokeless tobacco: Never Used  . Alcohol use No  . Drug use: No  . Sexual activity: Not Asked   Other Topics Concern  . None   Social History Narrative  . None    Review of Systems: General: Negative for anorexia, weight loss, fever, chills, fatigue, weakness. ENT: Negative for hoarseness, difficulty swallowing , nasal congestion.  CV: Negative for chest pain, angina, palpitations, dyspnea on exertion, peripheral edema.  Respiratory: Negative for dyspnea at rest, dyspnea on exertion, cough, sputum, wheezing.  GI: See history of present illness. Endo: Negative for unusual weight change.  Heme: Negative for bruising or bleeding.   Physical Exam: BP 137/77   Pulse 74   Temp 98 F (36.7 C) (Oral)   Ht 4\' 11"  (1.499 m)   Wt 240 lb 6.4 oz (109 kg)   BMI 48.55 kg/m  General:   Obese female. Alert and oriented. Pleasant and cooperative. Well-nourished and well-developed.  Head:  Normocephalic and atraumatic. Eyes:  Without icterus, sclera clear and conjunctiva pink.  Ears:  Normal auditory acuity. Cardiovascular:  S1, S2 present without murmurs appreciated. Extremities without clubbing or edema. Respiratory:  Clear to auscultation bilaterally. No wheezes, rales, or rhonchi. No distress.  Gastrointestinal:  +BS, obese but soft, non-tender and non-distended. No HSM noted. No guarding or rebound. No masses appreciated.  Rectal:  Deferred  Musculoskalatal:  Symmetrical without gross  deformities. Neurologic:  Alert and oriented x4;  grossly normal neurologically. Psych:  Alert and cooperative. Normal mood and affect. Heme/Lymph/Immune: No excessive bruising noted.    02/15/2017 9:49 AM   Disclaimer: This note was dictated with voice recognition software. Similar sounding words can inadvertently be transcribed and may not be corrected upon review.

## 2017-02-15 NOTE — Assessment & Plan Note (Signed)
Some recurrent constipation. Previously responded to Linzess 145 g once a day but it is lost effectiveness to a degree. I will start her on to 90 g dosing. I instructed her she can take her 145 g doses, 2 pills at once to substitute for 290 because she just had it filled. I will provide her with samples for a few days of 290 as well. I will send in a prescription for 290 to her pharmacy. Call if any worsening or recurrent symptoms. No red flag or warning signs or symptoms at this time. Return for follow-up in 3 months.

## 2017-02-15 NOTE — Patient Instructions (Signed)
1. Keep taking your acid blocker once a day. 2. I will increase the dose of your Linzess to 290 g. You can use her 145 g pills, taking 2 a day, at the same time, until you run out. 3. I sent in a prescription for 290 g pills to your pharmacy that you can refill when you ran out of the 145 g dosing. 4. Return for follow-up in 3 months.

## 2017-02-15 NOTE — Assessment & Plan Note (Signed)
GERD symptoms well controlled on once daily PPI. Recommend she continue this. Surveillance EGD dated 08/18/2016 with complete healing of gastric ulcers. Call our office if any changes, return for follow-up in 3 months.

## 2017-02-16 ENCOUNTER — Ambulatory Visit (INDEPENDENT_AMBULATORY_CARE_PROVIDER_SITE_OTHER): Payer: Medicare Other

## 2017-02-16 ENCOUNTER — Encounter (INDEPENDENT_AMBULATORY_CARE_PROVIDER_SITE_OTHER): Payer: Self-pay | Admitting: Specialist

## 2017-02-16 ENCOUNTER — Ambulatory Visit (INDEPENDENT_AMBULATORY_CARE_PROVIDER_SITE_OTHER): Payer: Medicare Other | Admitting: Specialist

## 2017-02-16 VITALS — BP 138/70 | HR 90 | Ht 59.0 in | Wt 240.0 lb

## 2017-02-16 DIAGNOSIS — Z981 Arthrodesis status: Secondary | ICD-10-CM

## 2017-02-16 DIAGNOSIS — M5020 Other cervical disc displacement, unspecified cervical region: Secondary | ICD-10-CM

## 2017-02-16 DIAGNOSIS — M50122 Cervical disc disorder at C5-C6 level with radiculopathy: Secondary | ICD-10-CM

## 2017-02-16 NOTE — Progress Notes (Signed)
Post-Op Visit Note   Patient: Cheryl [name]           Date of Birth: 1946/01/26           MRN: [id number] Visit Date: 02/16/2017 PCP: Purvis Kilts, MD   Assessment & Plan: 3 mo post op C5-6 ACDF fusion appears solid ,has residual numbness rught thumb and index finger tips.  Chief Complaint:  Cheryl [name] is here for her 3 month post op follow up after Anterior cervical Discectomy and fusion of C5-6. She states that she is doing good but she has some concerns.  She says that the tips of her right 1st and 2nd fingers are numb and she has numbness under her chin at the neck.  She states that this is something that she could live with.  She is also having occasional pain in to the left shoulder.  Visit Diagnoses:  1. Status post cervical spinal fusion   2. Herniated cervical intervertebral disc   3. Cervical disc disorder at C5-C6 level with radiculopathy     Plan: May lift up to 15-20 lbs, and may resume overhead use of the arms. Return appointment in 3 months.   Follow-Up Instructions: No Follow-up on file.   Orders:  Orders Placed This Encounter  Procedures  . XR Cervical Spine 2 or 3 views   No orders of the defined types were placed in this encounter.   Imaging: No results found.  PMFS History: Patient Active Problem List   Diagnosis Date Noted  . Herniated cervical intervertebral disc 11/19/2016  . Cervical disc disorder at C5-C6 level with radiculopathy 10/21/2016  . Gastric ulceration   . GERD (gastroesophageal reflux disease) 05/11/2016  . Constipation 05/11/2016  . Hashimoto's thyroiditis 02/20/2016  . History of colonic polyps   . Diverticulosis of colon without hemorrhage   . Complete tear of right rotator cuff 01/21/2015  . Severe obesity (BMI >= 40) ([place]) 01/21/2015  . Nausea alone 04/04/2013  . H/O adenomatous polyp of colon 04/04/2013  . Fatty liver 11/11/2010  . RUQ PAIN 11/11/2010  . NONSPEC ELEVATION OF LEVELS OF TRANSAMINASE/LDH 11/11/2010    . COLONIC POLYPS, HYPERPLASTIC, HX OF 06/25/2010  . ANEMIA-NOS 06/24/2010   Past Medical History:  Diagnosis Date  . Acid reflux   . Arthritis   . Neuromuscular disorder ([place])   . Sinus drainage   . Sleep apnea    had surgery to correct    Family History  Problem Relation Age of Onset  . Cancer Mother   . Cancer Father   . Diabetes Sister   . Colon cancer Neg Hx   . Liver disease Neg Hx     Past Surgical History:  Procedure Laterality Date  . ANTERIOR CERVICAL DECOMP/DISCECTOMY FUSION N/A 11/19/2016   Procedure: ANTERIOR CERVICAL DISCECTOMY AND FUSION C5-6 WITH PLATES, SCREWS, CAGE, VIVIGEN II;  Surgeon: Jessy Oto, MD;  Location: [facility];  Service: Orthopedics;  Laterality: N/A;  . CARPAL TUNNEL RELEASE    . CATARACT EXTRACTION, BILATERAL Bilateral 2017   One in December 2017 and one in January 2018  . COLONOSCOPY  02/15/2005   IWP:YKDXI small polyps ablated via cold biopsy, one from transverse colon and two from the rectum/Small external hemorrhoids  . COLONOSCOPY  07/10/2010   PJA:SNKNLZ rectum/long redundant colon, polyps in the sigmoid, descending, hepatic flexure/ADENOMATOUS POLYPS. next TCS due 06/2015  . COLONOSCOPY  1995   3 polyps, path revealed chronic colitis  . COLONOSCOPY N/A 08/05/2015  Procedure: COLONOSCOPY;  Surgeon: Daneil Dolin, MD;  [facility];  Service: Endoscopy;  Laterality: N/A;  1000  . ESOPHAGOGASTRODUODENOSCOPY  1995   Gastritis  . ESOPHAGOGASTRODUODENOSCOPY N/A 04/16/2013   RMR: HH  . ESOPHAGOGASTRODUODENOSCOPY N/A 05/13/2016   Procedure: ESOPHAGOGASTRODUODENOSCOPY (EGD);  Surgeon: Daneil Dolin, MD;  [facility];  Service: Endoscopy;  Laterality: N/A;  145  . ESOPHAGOGASTRODUODENOSCOPY N/A 08/18/2016   Procedure: ESOPHAGOGASTRODUODENOSCOPY (EGD);  Surgeon: Daneil Dolin, MD;  [facility];  Service: Endoscopy;  Laterality: N/A;  7:45 AM  . KNEE SURGERY     x2  . ROTATOR CUFF REPAIR     x2  . SHOULDER  ARTHROSCOPY WITH ROTATOR CUFF REPAIR AND SUBACROMIAL DECOMPRESSION Right 01/21/2015   Procedure: RIGHT SHOULDER ARTHROSCOPIC DEBRIDEMNT OF G-H JOINT AND REMOVAL OF LOOSE BODIES,ARTHROSCOPIC SUBACROMIAL DECOMPRESSION,MINI OPEN RCT REPAIR WITH SUPPLEMENTAL [facility] PATCH;  Surgeon: Garald Balding, MD;  Location: [place];  Service: Orthopedics;  Laterality: Right;  . TONSILLECTOMY    . TRIGGER FINGER RELEASE    . UVULOPALATOPHARYNGOPLASTY     Social History   Occupational History  . [employer]   Social History Main Topics  . Smoking status: Former Smoker    Years: 48.00    Types: Cigarettes    Quit date: 05/11/2005  . Smokeless tobacco: Never Used  . Alcohol use No  . Drug use: No  . Sexual activity: Not on file

## 2017-02-16 NOTE — Patient Instructions (Signed)
May lift up to 15-20 lbs, and may resume overhead use of the arms. Return appointment in 3 months.

## 2017-02-17 ENCOUNTER — Telehealth (INDEPENDENT_AMBULATORY_CARE_PROVIDER_SITE_OTHER): Payer: Self-pay | Admitting: *Deleted

## 2017-02-17 ENCOUNTER — Ambulatory Visit (INDEPENDENT_AMBULATORY_CARE_PROVIDER_SITE_OTHER): Payer: Medicare Other | Admitting: Specialist

## 2017-02-17 NOTE — Telephone Encounter (Signed)
Glasses, hearing aids, earrings and necklaces must be removed for imaging of her neck.  Rings, bracelets, and watches can be kept on, unless x-raying fingers, hand or wrist then they will need to be removed.

## 2017-02-17 NOTE — Telephone Encounter (Signed)
Pt calling with a question about her jewelry. She stated she only gets x-ray neck shots and was asking if she needs to take all of it off. [phone]

## 2017-02-17 NOTE — Progress Notes (Signed)
cc'ed to pcp °

## 2017-02-18 ENCOUNTER — Ambulatory Visit (INDEPENDENT_AMBULATORY_CARE_PROVIDER_SITE_OTHER): Payer: Medicare Other | Admitting: Internal Medicine

## 2017-02-18 ENCOUNTER — Encounter: Payer: Self-pay | Admitting: Internal Medicine

## 2017-02-18 VITALS — BP 124/84 | HR 92 | Wt 241.0 lb

## 2017-02-18 DIAGNOSIS — E063 Autoimmune thyroiditis: Secondary | ICD-10-CM

## 2017-02-18 MED ORDER — PANTOPRAZOLE SODIUM 40 MG PO TBEC: 40.0000 mg | DELAYED_RELEASE_TABLET | Freq: Every day | ORAL | 11 refills | Status: DC

## 2017-02-18 NOTE — Progress Notes (Addendum)
Patient ID: Cheryl [name], female   DOB: [birth year], 71 y.o.   MRN: [id number]   HPI  Cheryl [name] is a 71 y.o.-year-old female, initially referred by her PCP,  Dr. Micheline Rough, returning for f/u for euthyroid Hashimoto's thyroiditis. Last visit 1 year ago.  Since last visit, she had repeated sinus infections. She was on ABx x2 >> now off.  Pt. has been dx with Hashimoto's thyroiditis (without hypothyroidism) in 10/2015; we did not need to start Levothyroxine yet.  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  She has + FH of thyroid disorders in: sisters, mother. No FH of thyroid cancer. No FH of autoimmune ds. No h/o radiation tx to head or neck. No recent use of iodine supplements.  She is on Biotin 5000 mcg daily. Last dose: this am. She forgot to stop it 5 days ago as advised at last visit   I reviewed her chart and she also has a history of OSA >> had surgery for it, osteopenia (on Raloxifene), gout, hair loss.  ROS: Constitutional: + see history of present illness Eyes: + blurry vision, no xerophthalmia ENT: no sore throat, no nodules palpated in throat, no dysphagia/odynophagia, no hoarseness Cardiovascular: no CP/+ SOB/no palpitations/leg swelling Respiratory: + cough/+ SOB/+ wheezing Gastrointestinal: no N/V/D/+C/+ acid reflux >> better >>  was able to decrease Protonix to qdaily Musculoskeletal: + muscle/no joint aches Skin: no rashes, + hair loss Neurological: no tremors/numbness/tingling/dizziness  I reviewed pt's medications, allergies, PMH, social hx, family hx, and changes were documented in the history of present illness. Otherwise, unchanged from my initial visit note.  Past Medical History:  Diagnosis Date  . Acid reflux   . Arthritis   . Neuromuscular disorder ([place])   . Sinus drainage   . Sleep apnea    had surgery to correct   Past Surgical History:  Procedure Laterality Date  . ANTERIOR CERVICAL DECOMP/DISCECTOMY FUSION N/A 11/19/2016   Procedure: ANTERIOR CERVICAL DISCECTOMY AND FUSION C5-6 WITH PLATES, SCREWS, CAGE, VIVIGEN II;  Surgeon: Jessy Oto, MD;  Location: [place];  Service: Orthopedics;  Laterality: N/A;  . CARPAL TUNNEL RELEASE    . CATARACT EXTRACTION, BILATERAL Bilateral 2017   One in December 2017 and one in January 2018  . COLONOSCOPY  02/15/2005   NWG:NFAOZ small polyps ablated via cold biopsy, one from transverse colon and two from the rectum/Small external hemorrhoids  . COLONOSCOPY  07/10/2010   HYQ:MVHQIO rectum/long redundant colon, polyps in the sigmoid, descending, hepatic flexure/ADENOMATOUS POLYPS. next TCS due 06/2015  . COLONOSCOPY  1995   3 polyps, path revealed chronic colitis  . COLONOSCOPY N/A 08/05/2015   Procedure: COLONOSCOPY;  Surgeon: Daneil Dolin, MD;  [facility];  Service: Endoscopy;  Laterality: N/A;  1000  . ESOPHAGOGASTRODUODENOSCOPY  1995  Gastritis  . ESOPHAGOGASTRODUODENOSCOPY N/A 04/16/2013   RMR: HH  . ESOPHAGOGASTRODUODENOSCOPY N/A 05/13/2016   Procedure: ESOPHAGOGASTRODUODENOSCOPY (EGD);  Surgeon: Daneil Dolin, MD;  [facility];  Service: Endoscopy;  Laterality: N/A;  145  . ESOPHAGOGASTRODUODENOSCOPY N/A 08/18/2016   Procedure: ESOPHAGOGASTRODUODENOSCOPY (EGD);  Surgeon: Daneil Dolin, MD;  [facility];  Service:  Endoscopy;  Laterality: N/A;  7:45 AM  . KNEE SURGERY     x2  . ROTATOR CUFF REPAIR     x2  . SHOULDER ARTHROSCOPY WITH ROTATOR CUFF REPAIR AND SUBACROMIAL DECOMPRESSION Right 01/21/2015   Procedure: RIGHT SHOULDER ARTHROSCOPIC DEBRIDEMNT OF G-H JOINT AND REMOVAL OF LOOSE BODIES,ARTHROSCOPIC SUBACROMIAL DECOMPRESSION,MINI OPEN RCT REPAIR WITH SUPPLEMENTAL [facility] PATCH;  Surgeon: Garald Balding, MD;  Location: [place];  Service: Orthopedics;  Laterality: Right;  . TONSILLECTOMY    . TRIGGER FINGER RELEASE    . UVULOPALATOPHARYNGOPLASTY     Social History   Social History  . Marital Status: Married    Spouse Name: N/A  . Number of Children: 2   Occupational History  . BUS DRIVER - retired    Social History Main Topics  . Smoking status: Former Smoker -- 48 years    Types: Cigarettes  . Smokeless tobacco: Former Systems developer    Quit date: 12/20/2004  . Alcohol Use: No  . Drug Use: No   Current Outpatient Prescriptions on File Prior to Visit  Medication Sig Dispense Refill  . acetaminophen (TYLENOL) 500 MG tablet Take 500-1,000 mg by mouth every 6 (six) hours as needed (for pain/headache).    Marland Kitchen albuterol (PROVENTIL HFA;VENTOLIN HFA) 108 (90 BASE) MCG/ACT inhaler Inhale 1 puff into the lungs every 6 (six) hours as needed for wheezing or shortness of breath.    . allopurinol (ZYLOPRIM) 300 MG tablet Take 300 mg by mouth at bedtime.    . Biotin 5000 MCG TABS Take 5,000 mcg by mouth daily.    Marland Kitchen CALCIUM-VITAMIN D PO Take 1 tablet by mouth every morning.    . febuxostat (ULORIC) 40 MG tablet Take 40 mg by mouth at bedtime.     . finasteride (PROSCAR) 5 MG tablet Take 1 mg by mouth at bedtime.    . fluticasone (FLONASE) 50 MCG/ACT nasal spray Place 1 spray into both nostrils daily as needed for allergies (sinuses).    . gabapentin (NEURONTIN) 100 MG capsule Take 100 mg by mouth at bedtime as needed (for pain).    Marland Kitchen linaclotide (LINZESS) 290 MCG CAPS capsule Take 1 capsule (290 mcg total) by  mouth daily before breakfast. 30 capsule 3  . methocarbamol (ROBAXIN) 500 MG tablet Take 1 tablet (500 mg total) by mouth every 8 (eight) hours as needed for muscle spasms. 40 tablet 1  . montelukast (SINGULAIR) 10 MG tablet Take 10 mg by mouth every morning.    . pantoprazole (PROTONIX) 40 MG tablet Take 1 tablet (40 mg total) by mouth 2 (two) times daily. (Patient taking differently: Take 40 mg by mouth daily. ) 60 tablet 3  . raloxifene (EVISTA) 60 MG tablet Take 60 mg by mouth daily.    Marland Kitchen tolterodine (DETROL) 1 MG tablet Take 1 mg by mouth 2 (two) times daily.    . vitamin B-12 (CYANOCOBALAMIN) 1000 MCG tablet Take 1,000 mcg by mouth daily.     No current facility-administered medications on file prior to visit.    Allergies  Allergen Reactions  . Prednisone Other (See Comments)    Hallucinations   .  Tramadol Nausea Only  . Aspirin Nausea And Vomiting  . Celecoxib Rash   Family History  Problem Relation Age of Onset  . Cancer Mother   . Cancer Father   . Diabetes Sister   . Colon cancer Neg Hx   . Liver disease Neg Hx    PE: BP 124/84 (BP Location: Left Arm, Patient Position: Sitting)   Pulse 92   Wt 241 lb (109.3 kg)   SpO2 95%   BMI 48.68 kg/m  Wt Readings from Last 3 Encounters:  02/18/17 241 lb (109.3 kg)  02/16/17 240 lb (108.9 kg)  02/15/17 240 lb 6.4 oz (109 kg)   Constitutional: obesity class III, in NAD Eyes: PERRLA, EOMI, no exophthalmos ENT: moist mucous membranes, slight symmetric thyromegaly, no cervical lymphadenopathy Cardiovascular: RRR, No MRG Respiratory: CTA B Gastrointestinal: abdomen soft, NT, ND, BS+ Musculoskeletal: no deformities, strength intact in all 4 Skin: moist, warm, no rashes Neurological: no tremor with outstretched hands, DTR normal in all 4  ASSESSMENT: 1. Hashimoto thyroiditis  PLAN: 1. Hashimoto thyroiditis - We again discussed aboutHashimoto thyroiditis evolution and the fact that tx is limited to thyroid hormones in case  her TFTs are abnormal. However, they were normal at last 2 checks. At last visit, I suggested that she may try selenium to help decreasing the TPO antibodies, however, she did not remember this and did not start it. At this visit, I explained that this is not absolutely necessary, and that selenium has been tried as a possible treatment for Hashimoto's thyroiditis with various results, some showing improvement in the TPO antibodies. However, there are no randomized controlled trials of this are consistent results between trials.  - we need to repeat her thyroid tests 5 days after she stops biotin, since this can interact with the TFTs assay. She will come back for these labs. I did advise her to try to stop the biotin before our next visit. - I advised the patient to return in a year for a visit and repeat labs. I again advised her that she should let me know if she develops sxs of hypothyroidism (we discussed about these) or neck compression symptoms, in that case, we will need to repeat the TFTs - I advised pt to join my chart and I will send her the results through there. She refuses, prefers to be called with the results.  CC: Dr Ronita Hipps  Component     Latest Ref Rng & Units 02/25/2017  TSH     0.35 - 4.50 uIU/mL 1.54  T4,Free(Direct)     0.60 - 1.60 ng/dL 0.85  Triiodothyronine,Free,Serum     2.3 - 4.2 pg/mL 3.6  TFTs are great >> no need to start LT4.   Philemon Kingdom, MD PhD [facility] Endocrinology

## 2017-02-18 NOTE — Patient Instructions (Signed)
Please stop Biotin for 5 days, then come back for labs.  Please return in 1 year.

## 2017-02-25 ENCOUNTER — Other Ambulatory Visit (INDEPENDENT_AMBULATORY_CARE_PROVIDER_SITE_OTHER): Payer: Medicare Other

## 2017-02-25 DIAGNOSIS — E063 Autoimmune thyroiditis: Secondary | ICD-10-CM

## 2017-02-25 LAB — T3, FREE: T3 FREE: 3.6 pg/mL (ref 2.3–4.2)

## 2017-02-25 LAB — T4, FREE: Free T4: 0.85 ng/dL (ref 0.60–1.60)

## 2017-02-25 LAB — TSH: TSH: 1.54 u[IU]/mL (ref 0.35–4.50)

## 2017-03-01 ENCOUNTER — Telehealth: Payer: Self-pay

## 2017-03-01 NOTE — Telephone Encounter (Signed)
-----   Message from Philemon Kingdom, MD sent at 03/01/2017 10:37 AM EDT ----- Cheryl [name], can you please call pt: TFTs are great >> no need to start LT4.

## 2017-03-01 NOTE — Telephone Encounter (Signed)
LVM, gave lab results. Gave call back number if any questions or concerns.  

## 2017-04-05 DIAGNOSIS — L821 Other seborrheic keratosis: Secondary | ICD-10-CM | POA: Diagnosis not present

## 2017-04-05 DIAGNOSIS — L820 Inflamed seborrheic keratosis: Secondary | ICD-10-CM | POA: Diagnosis not present

## 2017-04-05 DIAGNOSIS — L658 Other specified nonscarring hair loss: Secondary | ICD-10-CM | POA: Diagnosis not present

## 2017-04-06 DIAGNOSIS — Z1389 Encounter for screening for other disorder: Secondary | ICD-10-CM | POA: Diagnosis not present

## 2017-04-06 DIAGNOSIS — Z0000 Encounter for general adult medical examination without abnormal findings: Secondary | ICD-10-CM | POA: Diagnosis not present

## 2017-04-06 DIAGNOSIS — R7309 Other abnormal glucose: Secondary | ICD-10-CM | POA: Diagnosis not present

## 2017-04-06 DIAGNOSIS — E782 Mixed hyperlipidemia: Secondary | ICD-10-CM | POA: Diagnosis not present

## 2017-04-06 DIAGNOSIS — Z6841 Body Mass Index (BMI) 40.0 and over, adult: Secondary | ICD-10-CM | POA: Diagnosis not present

## 2017-04-06 DIAGNOSIS — E669 Obesity, unspecified: Secondary | ICD-10-CM | POA: Diagnosis not present

## 2017-04-07 ENCOUNTER — Ambulatory Visit (INDEPENDENT_AMBULATORY_CARE_PROVIDER_SITE_OTHER): Payer: Medicare Other | Admitting: Orthopaedic Surgery

## 2017-04-07 DIAGNOSIS — M25562 Pain in left knee: Secondary | ICD-10-CM

## 2017-04-07 DIAGNOSIS — G8929 Other chronic pain: Secondary | ICD-10-CM

## 2017-04-08 ENCOUNTER — Ambulatory Visit (INDEPENDENT_AMBULATORY_CARE_PROVIDER_SITE_OTHER): Payer: Medicare Other

## 2017-04-08 ENCOUNTER — Ambulatory Visit (INDEPENDENT_AMBULATORY_CARE_PROVIDER_SITE_OTHER): Payer: Medicare Other | Admitting: Orthopaedic Surgery

## 2017-04-08 ENCOUNTER — Encounter (INDEPENDENT_AMBULATORY_CARE_PROVIDER_SITE_OTHER): Payer: Self-pay | Admitting: Orthopaedic Surgery

## 2017-04-08 VITALS — BP 143/81 | HR 79 | Resp 16 | Ht 59.0 in | Wt 244.0 lb

## 2017-04-08 DIAGNOSIS — M25562 Pain in left knee: Secondary | ICD-10-CM

## 2017-04-08 DIAGNOSIS — M25561 Pain in right knee: Secondary | ICD-10-CM

## 2017-04-08 DIAGNOSIS — G8929 Other chronic pain: Secondary | ICD-10-CM

## 2017-04-08 MED ORDER — BUPIVACAINE HCL 0.5 % IJ SOLN
3.0000 mL | INTRAMUSCULAR | Status: AC | PRN
  Administered 2017-04-08: 3 mL via INTRA_ARTICULAR

## 2017-04-08 MED ORDER — METHYLPREDNISOLONE ACETATE 40 MG/ML IJ SUSP
80.0000 mg | INTRAMUSCULAR | Status: AC | PRN
  Administered 2017-04-08: 80 mg

## 2017-04-08 MED ORDER — LIDOCAINE HCL 1 % IJ SOLN
5.0000 mL | INTRAMUSCULAR | Status: AC | PRN
  Administered 2017-04-08: 5 mL

## 2017-04-08 NOTE — Progress Notes (Signed)
Office Visit Note   Patient: Cheryl [name]           Date of Birth: [birth year]           MRN: [id number] Visit Date: 04/08/2017              Requested by: Sharilyn Sites, MD [address] [place], [place] [address] PCP: Purvis Kilts, MD   Assessment & Plan: Visit Diagnoses:  1. Chronic pain of left knee   2. Chronic pain of right knee    Advanced osteoarthritis both knees Plan: Long discussion regarding films and advanced OA both knees and treatment options. Will inject right knee toiday and see in 2 weeks to consider injecting left knee  Follow-Up Instructions: No Follow-up on file.   Orders:  Orders Placed This Encounter  Procedures  . XR KNEE 3 VIEW RIGHT  . XR KNEE 3 VIEW LEFT   No orders of the defined types were placed in this encounter.     Procedures: Large Joint Inj Date/Time: 04/08/2017 10:40 AM Performed by: Garald Balding Authorized by: Garald Balding   Consent Given by:  Patient Timeout: prior to procedure the correct patient, procedure, and site was verified   Indications:  Pain and joint swelling Location:  Knee Site:  R knee Prep: patient was prepped and draped in usual sterile fashion   Needle Size:  25 G Needle Length:  1.5 inches Approach:  Anteromedial Ultrasound Guidance: No   Fluoroscopic Guidance: No   Arthrogram: No   Medications:  5 mL lidocaine 1 %; 80 mg methylPREDNISolone acetate 40 MG/ML; 3 mL bupivacaine 0.5 % Aspiration Attempted: No   Patient tolerance:  Patient tolerated the procedure well with no immediate complications     Clinical Data: No additional findings.   Subjective: Chief Complaint  Patient presents with  . Right Knee - Pain, Edema  . Left Knee - Pain, Edema    Cheryl [name] is a 71 y o that presents with bilateral knee pain x 6 months. Hx of cortisone injections  Cheryl [name] has been seen in the past for evaluation of knee pain "many years ago". She is experiencing popping clicking associated  with stiffness and occasional swelling of both knees. Presently the right knee is more symptomatic than the right. She has prior films consistent with osteoarthritis  HPI  Review of Systems   Objective: Vital Signs: BP (!) 143/81   Pulse 79   Resp 16   Ht 4\' 11"  (1.499 m)   Wt 244 lb (110.7 kg)   BMI 49.28 kg/m   Physical Exam  Ortho Exam right knee exam with predominantly medial joint pain. Positive patellar crepitation. No effusion. No instability. No popliteal pain or calf discomfort. Painless range of motion of both hips. Straight leg raise negative. Good pulses distally.  Left knee exam with similar findings. Large knees. Mostly medial joint pain with patellar crepitation. No obvious effusion. No instability.:    Imaging: Xr Knee 3 View Left  Result Date: 04/08/2017 Films of the left knee were obtained in 3 projections standing. Lateral patellar tilt and mild patella alter. Significant decrease in the medial joint space with subchondral sclerosis on both sides of the joint. Peripheral osteophytes in lateral compartment. All consistent with advanced osteoarthritis  Xr Knee 3 View Right  Result Date: 04/08/2017 Films of the right knee reveal advanced osteoarthritis. There is considerable irregularity along the distal femoral surfaces both medially and laterally with peripheral osteophytes and subchondral  sclerosis. Also significant degenerative changes of the patellofemoral joint with large peripheral osteophytes laterally and lateral patella tilt. Mild patella alta    PMFS History: Patient Active Problem List   Diagnosis Date Noted  . Herniated cervical intervertebral disc 11/19/2016  . Cervical disc disorder at C5-C6 level with radiculopathy 10/21/2016  . Gastric ulceration   . GERD (gastroesophageal reflux disease) 05/11/2016  . Constipation 05/11/2016  . Hashimoto's thyroiditis 02/20/2016  . History of colonic polyps   . Diverticulosis of colon without hemorrhage     . Complete tear of right rotator cuff 01/21/2015  . Severe obesity (BMI >= 40) ([place]) 01/21/2015  . Nausea alone 04/04/2013  . H/O adenomatous polyp of colon 04/04/2013  . Fatty liver 11/11/2010  . RUQ PAIN 11/11/2010  . NONSPEC ELEVATION OF LEVELS OF TRANSAMINASE/LDH 11/11/2010  . COLONIC POLYPS, HYPERPLASTIC, HX OF 06/25/2010  . ANEMIA-NOS 06/24/2010   Past Medical History:  Diagnosis Date  . Acid reflux   . Arthritis   . Neuromuscular disorder ([place])   . Sinus drainage   . Sleep apnea    had surgery to correct    Family History  Problem Relation Age of Onset  . Cancer Mother   . Cancer Father   . Diabetes Sister   . Colon cancer Neg Hx   . Liver disease Neg Hx     Past Surgical History:  Procedure Laterality Date  . ANTERIOR CERVICAL DECOMP/DISCECTOMY FUSION N/A 11/19/2016   Procedure: ANTERIOR CERVICAL DISCECTOMY AND FUSION C5-6 WITH PLATES, SCREWS, CAGE, VIVIGEN II;  Surgeon: Jessy Oto, MD;  Location: [facility];  Service: Orthopedics;  Laterality: N/A;  . CARPAL TUNNEL RELEASE    . CATARACT EXTRACTION, BILATERAL Bilateral 2017   One in December 2017 and one in January 2018  . COLONOSCOPY  02/15/2005   TWK:[id number] small polyps ablated via cold biopsy, one from transverse colon and two from the rectum/Small external hemorrhoids  . COLONOSCOPY  07/10/2010   NOT:RRNHAF rectum/long redundant colon, polyps in the sigmoid, descending, hepatic flexure/ADENOMATOUS POLYPS. next TCS due 06/2015  . COLONOSCOPY  1995   3 polyps, path revealed chronic colitis  . COLONOSCOPY N/A 08/05/2015   Procedure: COLONOSCOPY;  Surgeon: Daneil Dolin, MD;  [facility];  Service: Endoscopy;  Laterality: N/A;  1000  . ESOPHAGOGASTRODUODENOSCOPY  1995   Gastritis  . ESOPHAGOGASTRODUODENOSCOPY N/A 04/16/2013   RMR: HH  . ESOPHAGOGASTRODUODENOSCOPY N/A 05/13/2016   Procedure: ESOPHAGOGASTRODUODENOSCOPY (EGD);  Surgeon: Daneil Dolin, MD;  [facility];  Service: Endoscopy;   Laterality: N/A;  145  . ESOPHAGOGASTRODUODENOSCOPY N/A 08/18/2016   Procedure: ESOPHAGOGASTRODUODENOSCOPY (EGD);  Surgeon: Daneil Dolin, MD;  [facility];  Service: Endoscopy;  Laterality: N/A;  7:45 AM  . KNEE SURGERY     x2  . ROTATOR CUFF REPAIR     x2  . SHOULDER ARTHROSCOPY WITH ROTATOR CUFF REPAIR AND SUBACROMIAL DECOMPRESSION Right 01/21/2015   Procedure: RIGHT SHOULDER ARTHROSCOPIC DEBRIDEMNT OF G-H JOINT AND REMOVAL OF LOOSE BODIES,ARTHROSCOPIC SUBACROMIAL DECOMPRESSION,MINI OPEN RCT REPAIR WITH SUPPLEMENTAL [facility] PATCH;  Surgeon: Garald Balding, MD;  Location: [place];  Service: Orthopedics;  Laterality: Right;  . TONSILLECTOMY    . TRIGGER FINGER RELEASE    . UVULOPALATOPHARYNGOPLASTY     Social History   Occupational History  . [employer]   Social History Main Topics  . Smoking status: Former Smoker    Years: 48.00    Types: Cigarettes  Quit date: 05/11/2005  . Smokeless tobacco: Never Used  . Alcohol use No  . Drug use: No  . Sexual activity: Not on file

## 2017-04-11 ENCOUNTER — Ambulatory Visit (INDEPENDENT_AMBULATORY_CARE_PROVIDER_SITE_OTHER): Payer: Medicare Other | Admitting: Orthopaedic Surgery

## 2017-04-20 ENCOUNTER — Encounter (INDEPENDENT_AMBULATORY_CARE_PROVIDER_SITE_OTHER): Payer: Self-pay | Admitting: Orthopedic Surgery

## 2017-04-20 ENCOUNTER — Ambulatory Visit (INDEPENDENT_AMBULATORY_CARE_PROVIDER_SITE_OTHER): Payer: Medicare Other | Admitting: Orthopedic Surgery

## 2017-04-20 VITALS — BP 169/100 | HR 117 | Resp 17 | Ht 59.0 in | Wt 241.0 lb

## 2017-04-20 DIAGNOSIS — M1712 Unilateral primary osteoarthritis, left knee: Secondary | ICD-10-CM | POA: Diagnosis not present

## 2017-04-20 DIAGNOSIS — M1711 Unilateral primary osteoarthritis, right knee: Secondary | ICD-10-CM

## 2017-04-20 MED ORDER — METHYLPREDNISOLONE ACETATE 40 MG/ML IJ SUSP
80.0000 mg | INTRAMUSCULAR | Status: AC | PRN
  Administered 2017-04-20: 80 mg

## 2017-04-20 MED ORDER — LIDOCAINE HCL 1 % IJ SOLN
5.0000 mL | INTRAMUSCULAR | Status: AC | PRN
  Administered 2017-04-20: 5 mL

## 2017-04-20 MED ORDER — BUPIVACAINE HCL 0.5 % IJ SOLN
3.0000 mL | INTRAMUSCULAR | Status: AC | PRN
  Administered 2017-04-20: 3 mL via INTRA_ARTICULAR

## 2017-04-20 NOTE — Progress Notes (Signed)
Office Visit Note   Patient: Cheryl [name]           Date of Birth: [birth year]           MRN: [id number] Visit Date: 04/20/2017              Requested by: Sharilyn Sites, MD [address] [place], [place] [address] PCP: Purvis Kilts, MD   Assessment & Plan: Visit Diagnoses:  1. Unilateral primary osteoarthritis, left knee   2. Unilateral primary osteoarthritis, right knee     Plan:  #1: Corticosteroid injection was given to the left knee without difficulty #2: Follow up when necessary. Call for Visco supplementation if she would like to.  Follow-Up Instructions: No Follow-up on file.   Orders:  Orders Placed This Encounter  Procedures  . Large Joint Injection/Arthrocentesis   No orders of the defined types were placed in this encounter.     Procedures: Large Joint Inj Date/Time: 04/20/2017 10:43 AM Performed by: Biagio Borg D Authorized by: Biagio Borg D   Consent Given by:  Patient Timeout: prior to procedure the correct patient, procedure, and site was verified   Indications:  Pain and joint swelling Location:  Knee Site:  L knee Prep: patient was prepped and draped in usual sterile fashion   Needle Size:  25 G Needle Length:  1.5 inches Approach:  Anteromedial Ultrasound Guidance: No   Fluoroscopic Guidance: No   Arthrogram: No   Medications:  3 mL bupivacaine 0.5 %; 5 mL lidocaine 1 %; 80 mg methylPREDNISolone acetate 40 MG/ML Aspiration Attempted: No   Patient tolerance:  Patient tolerated the procedure well with no immediate complications      Clinical Data: No additional findings.   Subjective: Chief Complaint  Patient presents with  . Left Knee - Pain  . Knee Pain    Pain x 3 months, some swelling, difficulty walking, no inury, surgery - Dr. Durward Fortes, not diabetic, Tylenol doesn't help, wants inj.    Cheryl [name] is a very pleasant 71-year-old African-American female who is seen today for evaluation of her left knee. She had been  seen several weeks ago and did have a corticosteroid injection to the right knee which was beneficial. She comes in today stating the left knee is now painful. She is already been diagnosed with osteoarthritis of the left knee previously. She would like to consider corticosteroid injection.    Review of Systems   Objective: Vital Signs: BP (!) 169/100 (BP Location: Left Arm, Patient Position: Sitting, Cuff Size: Normal)   Pulse (!) 117   Resp 17   Ht 4\' 11"  (1.499 m)   Wt 241 lb (109.3 kg)   BMI 48.68 kg/m   Physical Exam  Constitutional: She is oriented to person, place, and time. She appears well-developed and well-nourished.  Morbidly Obese  HENT:  Head: Normocephalic and atraumatic.  Eyes: EOM are normal. Pupils are equal, round, and reactive to light.  Pulmonary/Chest: Effort normal.  Neurological: She is alert and oriented to person, place, and time.  Skin: Skin is warm and dry.  Psychiatric: She has a normal mood and affect. Her behavior is normal. Judgment and thought content normal.    Ortho Exam  Exam left knee exam with predominantly medial joint pain. Positive patellar crepitation. No effusion. No instability. No popliteal pain or calf discomfort. Painless range of motion of both hips. Straight leg raise negative. Good pulses distally.   Specialty Comments:  No specialty comments available.  Imaging: No  results found.  Current Outpatient Prescriptions  Medication Sig Dispense Refill  . acetaminophen (TYLENOL) 500 MG tablet Take 500-1,000 mg by mouth every 6 (six) hours as needed (for pain/headache).    Marland Kitchen albuterol (PROVENTIL HFA;VENTOLIN HFA) 108 (90 BASE) MCG/ACT inhaler Inhale 1 puff into the lungs every 6 (six) hours as needed for wheezing or shortness of breath.    . allopurinol (ZYLOPRIM) 300 MG tablet Take 300 mg by mouth at bedtime.    . Biotin 5000 MCG TABS Take 5,000 mcg by mouth daily.    Marland Kitchen CALCIUM-VITAMIN D PO Take 1 tablet by mouth every morning.      . febuxostat (ULORIC) 40 MG tablet Take 40 mg by mouth at bedtime.     . finasteride (PROSCAR) 5 MG tablet Take 1 mg by mouth at bedtime.    . fluticasone (FLONASE) 50 MCG/ACT nasal spray Place 1 spray into both nostrils daily as needed for allergies (sinuses).    . gabapentin (NEURONTIN) 100 MG capsule Take 100 mg by mouth at bedtime as needed (for pain).    Marland Kitchen linaclotide (LINZESS) 290 MCG CAPS capsule Take 1 capsule (290 mcg total) by mouth daily before breakfast. 30 capsule 3  . methocarbamol (ROBAXIN) 500 MG tablet Take 1 tablet (500 mg total) by mouth every 8 (eight) hours as needed for muscle spasms. 40 tablet 1  . montelukast (SINGULAIR) 10 MG tablet Take 10 mg by mouth every morning.    . pantoprazole (PROTONIX) 40 MG tablet Take 1 tablet (40 mg total) by mouth daily. 30 tablet 11  . raloxifene (EVISTA) 60 MG tablet Take 60 mg by mouth daily.    Marland Kitchen tolterodine (DETROL) 1 MG tablet Take 1 mg by mouth 2 (two) times daily.    . vitamin B-12 (CYANOCOBALAMIN) 1000 MCG tablet Take 1,000 mcg by mouth daily.     No current facility-administered medications for this visit.     PMFS History: Patient Active Problem List   Diagnosis Date Noted  . Herniated cervical intervertebral disc 11/19/2016  . Cervical disc disorder at C5-C6 level with radiculopathy 10/21/2016  . Gastric ulceration   . GERD (gastroesophageal reflux disease) 05/11/2016  . Constipation 05/11/2016  . Hashimoto's thyroiditis 02/20/2016  . History of colonic polyps   . Diverticulosis of colon without hemorrhage   . Complete tear of right rotator cuff 01/21/2015  . Severe obesity (BMI >= 40) ([place]) 01/21/2015  . Nausea alone 04/04/2013  . H/O adenomatous polyp of colon 04/04/2013  . Fatty liver 11/11/2010  . RUQ PAIN 11/11/2010  . NONSPEC ELEVATION OF LEVELS OF TRANSAMINASE/LDH 11/11/2010  . COLONIC POLYPS, HYPERPLASTIC, HX OF 06/25/2010  . ANEMIA-NOS 06/24/2010   Past Medical History:  Diagnosis Date  . Acid reflux    . Arthritis   . Neuromuscular disorder ([place])   . Sinus drainage   . Sleep apnea    had surgery to correct    Family History  Problem Relation Age of Onset  . Cancer Mother   . Cancer Father   . Diabetes Sister   . Colon cancer Neg Hx   . Liver disease Neg Hx     Past Surgical History:  Procedure Laterality Date  . ANTERIOR CERVICAL DECOMP/DISCECTOMY FUSION N/A 11/19/2016   Procedure: ANTERIOR CERVICAL DISCECTOMY AND FUSION C5-6 WITH PLATES, SCREWS, CAGE, VIVIGEN II;  Surgeon: Jessy Oto, MD;  Location: [facility];  Service: Orthopedics;  Laterality: N/A;  . CARPAL TUNNEL RELEASE    . CATARACT EXTRACTION, BILATERAL Bilateral  2017   One in December 2017 and one in January 2018  . COLONOSCOPY  02/15/2005   MAY:OKHTX small polyps ablated via cold biopsy, one from transverse colon and two from the rectum/Small external hemorrhoids  . COLONOSCOPY  07/10/2010   HFS:FSELTR rectum/long redundant colon, polyps in the sigmoid, descending, hepatic flexure/ADENOMATOUS POLYPS. next TCS due 06/2015  . COLONOSCOPY  1995   3 polyps, path revealed chronic colitis  . COLONOSCOPY N/A 08/05/2015   Procedure: COLONOSCOPY;  Surgeon: Daneil Dolin, MD;  [facility];  Service: Endoscopy;  Laterality: N/A;  1000  . ESOPHAGOGASTRODUODENOSCOPY  1995   Gastritis  . ESOPHAGOGASTRODUODENOSCOPY N/A 04/16/2013   RMR: HH  . ESOPHAGOGASTRODUODENOSCOPY N/A 05/13/2016   Procedure: ESOPHAGOGASTRODUODENOSCOPY (EGD);  Surgeon: Daneil Dolin, MD;  [facility];  Service: Endoscopy;  Laterality: N/A;  145  . ESOPHAGOGASTRODUODENOSCOPY N/A 08/18/2016   Procedure: ESOPHAGOGASTRODUODENOSCOPY (EGD);  Surgeon: Daneil Dolin, MD;  [facility];  Service: Endoscopy;  Laterality: N/A;  7:45 AM  . KNEE SURGERY     x2  . ROTATOR CUFF REPAIR     x2  . SHOULDER ARTHROSCOPY WITH ROTATOR CUFF REPAIR AND SUBACROMIAL DECOMPRESSION Right 01/21/2015   Procedure: RIGHT SHOULDER ARTHROSCOPIC DEBRIDEMNT OF  G-H JOINT AND REMOVAL OF LOOSE BODIES,ARTHROSCOPIC SUBACROMIAL DECOMPRESSION,MINI OPEN RCT REPAIR WITH SUPPLEMENTAL [facility] PATCH;  Surgeon: Garald Balding, MD;  Location: [facility];  Service: Orthopedics;  Laterality: Right;  . TONSILLECTOMY    . TRIGGER FINGER RELEASE    . UVULOPALATOPHARYNGOPLASTY     Social History   Occupational History  . [employer]   Social History Main Topics  . Smoking status: Former Smoker    Packs/day: 0.10    Years: 48.00    Types: Cigarettes    Quit date: 05/11/2005  . Smokeless tobacco: Never Used  . Alcohol use No  . Drug use: No  . Sexual activity: Not on file

## 2017-05-08 IMAGING — DX DG THORACIC SPINE 2V
3 series · 3 of 3 positions shown · non-contrast
Comparison: None.

CLINICAL DATA: Thoracic pain on right side under scapula radiating
to right lower back.

EXAM:
THORACIC SPINE 2 VIEWS

[t thoracic spine ap]
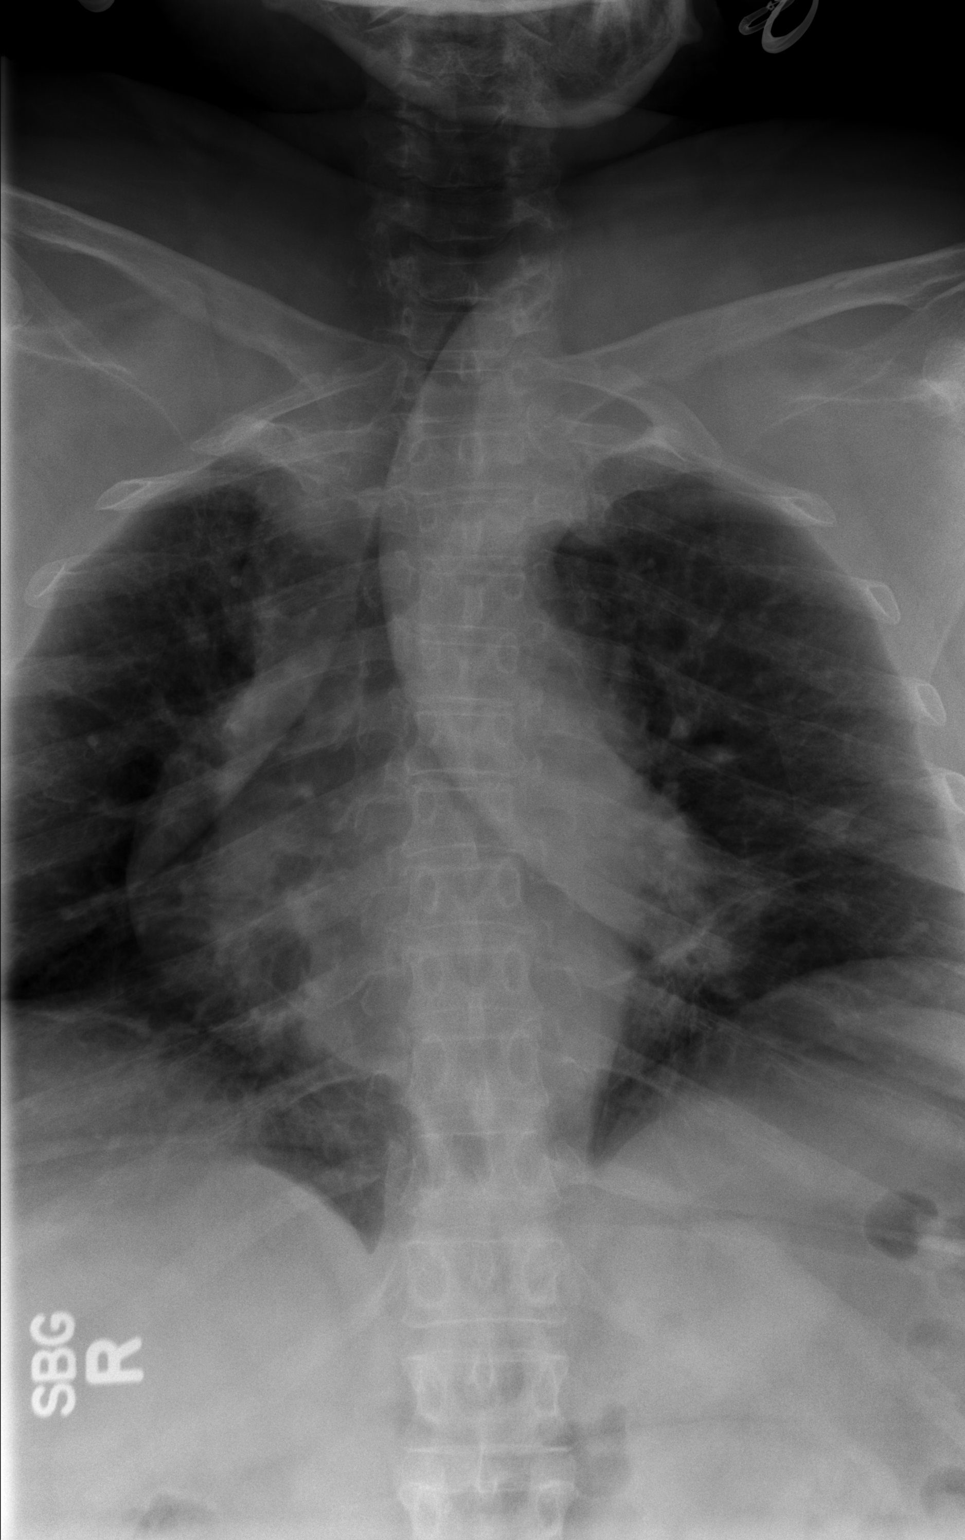

[t thoracic spine lat]
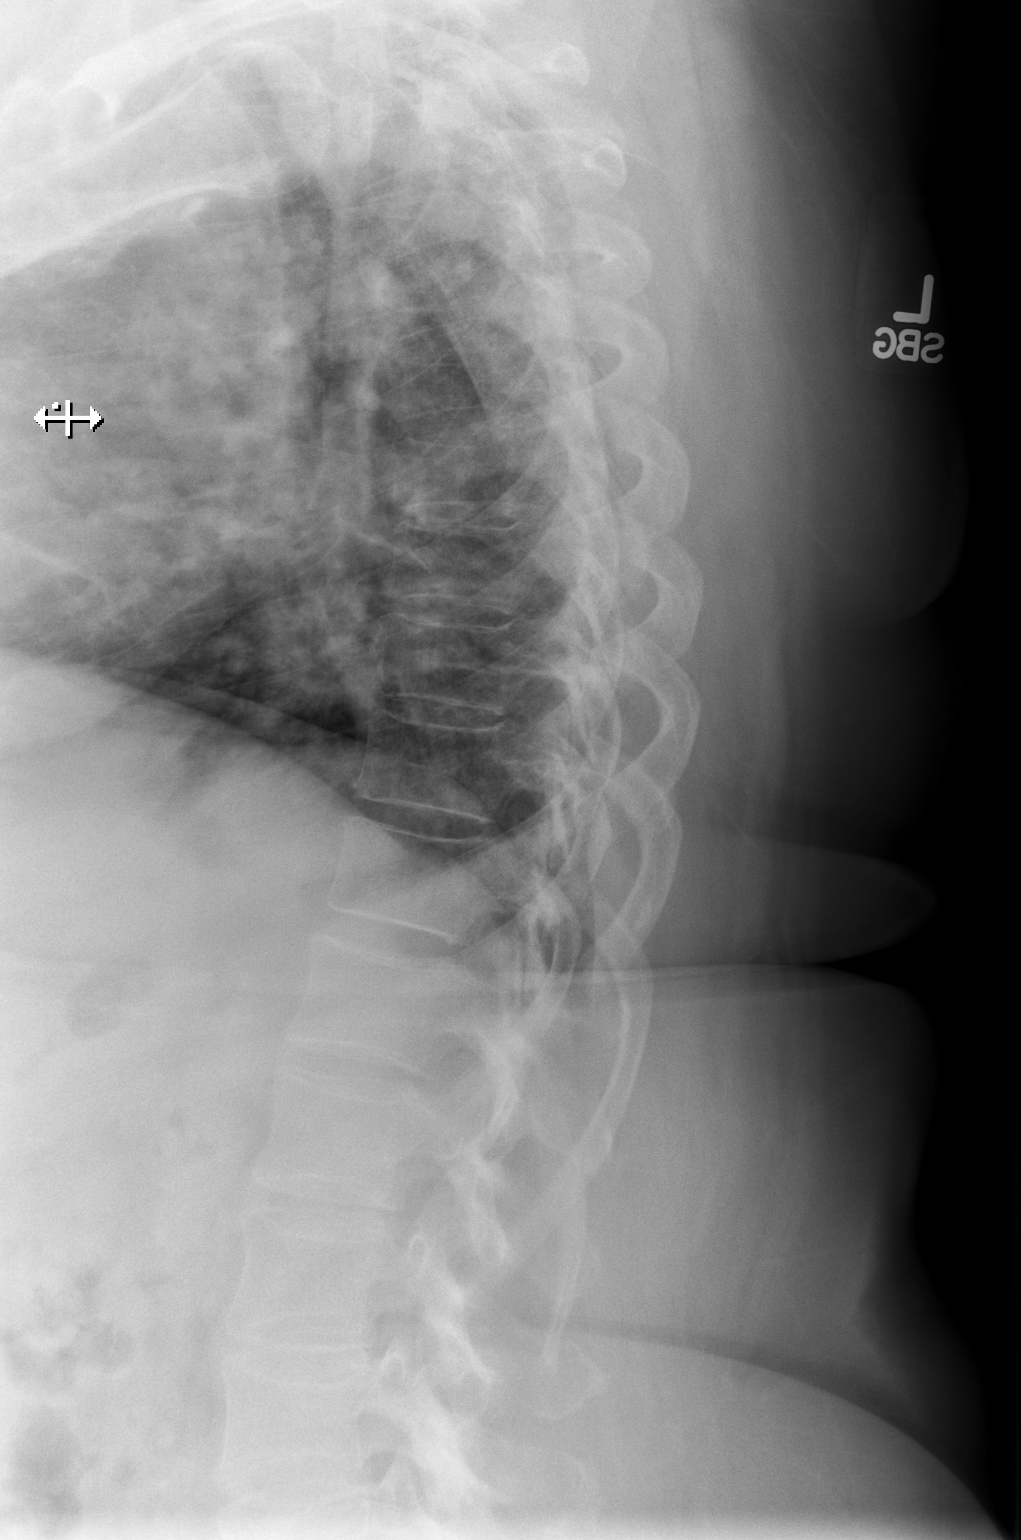

[t thoracic swimmers]
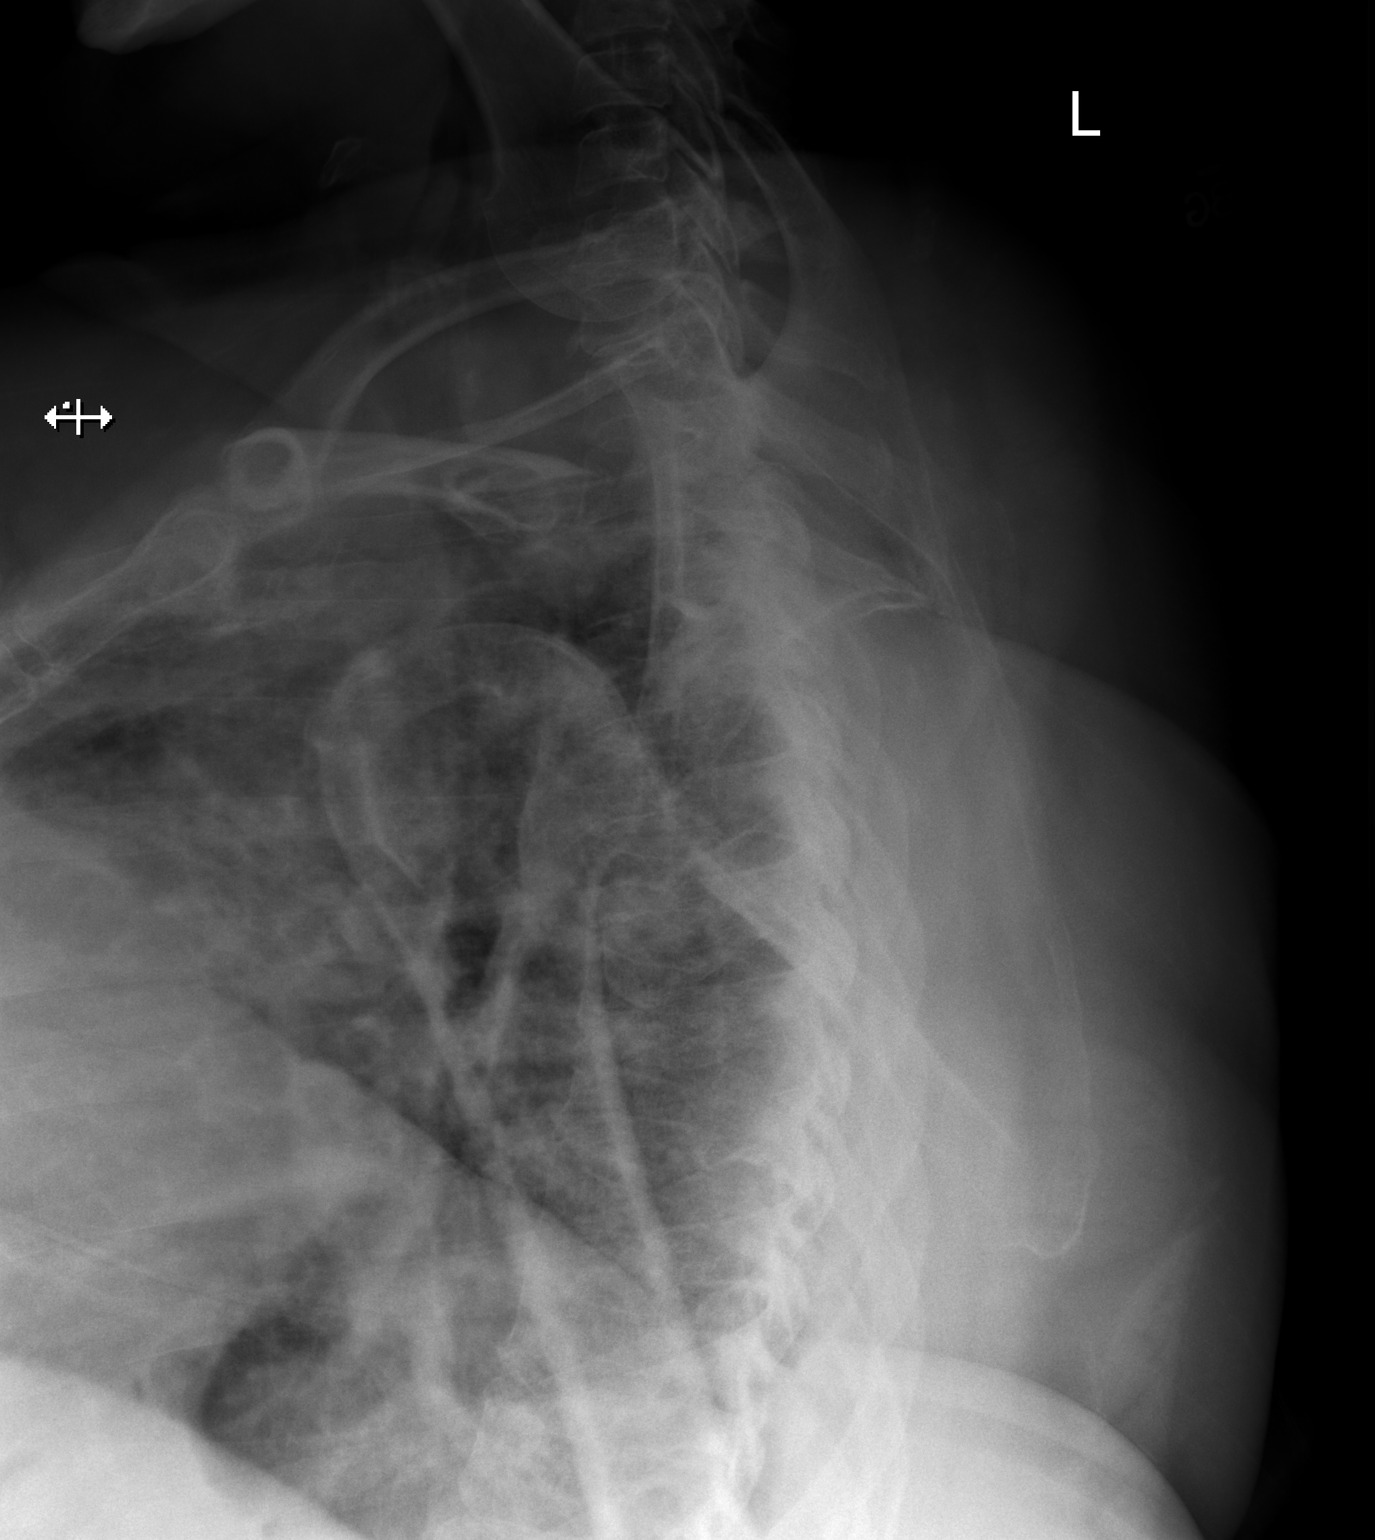

[3 of 3 positions shown; findings below may reference images not displayed]

FINDINGS: There is no evidence of thoracic spine fracture. Alignment is
normal. No other significant bone abnormalities are identified.
IMPRESSION: Negative.

## 2017-05-12 ENCOUNTER — Ambulatory Visit (INDEPENDENT_AMBULATORY_CARE_PROVIDER_SITE_OTHER): Payer: Medicare Other | Admitting: Specialist

## 2017-05-12 ENCOUNTER — Encounter (INDEPENDENT_AMBULATORY_CARE_PROVIDER_SITE_OTHER): Payer: Self-pay | Admitting: Specialist

## 2017-05-12 VITALS — BP 143/82 | HR 77 | Ht 59.0 in | Wt 240.0 lb

## 2017-05-12 DIAGNOSIS — K7689 Other specified diseases of liver: Secondary | ICD-10-CM | POA: Diagnosis not present

## 2017-05-12 DIAGNOSIS — M4322 Fusion of spine, cervical region: Secondary | ICD-10-CM

## 2017-05-12 NOTE — Progress Notes (Addendum)
Office Visit Note   Patient: Cheryl [name]           Date of Birth: 25-Oct-1946           MRN: [id number] Visit Date: 05/12/2017              Requested by: Sharilyn Sites, [address] [place], [place] [address] PCP: Sharilyn Sites, MD   Assessment & Plan: Visit Diagnoses:  1. Cervical vertebral fusion   71 year old female underwent cervical fusion C5-6 11/2016 for left cervical radiculopathy, she is improved following decompression and fusion. Some mild left biceps weakness.  She is pleased with pain relief and improved fusion, last radiograph does not show significant motion at the fusion site and she has no complaints  Of pain. Will plan to see her back as needed.   Plan:Avoid overhead lifting and overhead use of the arms. Do not lift greater than 5 lbs. Adjust head rest in vehicle to prevent hyperextension if rear ended.  Follow-Up Instructions: Return if symptoms worsen or fail to improve.   Orders:  No orders of the defined types were placed in this encounter.  No orders of the defined types were placed in this encounter.     Procedures: No procedures performed   Clinical Data: No additional findings.   Subjective: Chief Complaint  Patient presents with  . Neck - Follow-up    71 year old female now 20months and 3 weeks post ACDF C5-6  With plates screws and Ti cage. No complaints. No Gait disturbance. Strength and arm pain are better.     Review of Systems  HENT: Positive for congestion and sneezing.   Respiratory: Positive for cough, chest tightness and shortness of breath.   Musculoskeletal: Negative for neck pain and neck stiffness.  Neurological: Negative for weakness and numbness.     Objective: Vital Signs: BP (!) 143/82   Pulse 77   Ht 4\' 11"  (1.499 m)   Wt 240 lb (108.9 kg)   BMI 48.47 kg/m   Physical Exam  Ortho Exam  Specialty Comments:  No specialty comments available.  Imaging: No results found.   PMFS  History: Patient Active Problem List   Diagnosis Date Noted  . Cervical disc disorder at C5-C6 level with radiculopathy 10/21/2016    Priority: High  . Herniated cervical intervertebral disc 11/19/2016  . Gastric ulceration   . GERD (gastroesophageal reflux disease) 05/11/2016  . Constipation 05/11/2016  . Hashimoto's thyroiditis 02/20/2016  . History of colonic polyps   . Diverticulosis of colon without hemorrhage   . Complete tear of right rotator cuff 01/21/2015  . Severe obesity (BMI >= 40) ([place]) 01/21/2015  . Nausea alone 04/04/2013  . H/O adenomatous polyp of colon 04/04/2013  . Fatty liver 11/11/2010  . RUQ PAIN 11/11/2010  . NONSPEC ELEVATION OF LEVELS OF TRANSAMINASE/LDH 11/11/2010  . COLONIC POLYPS, HYPERPLASTIC, HX OF 06/25/2010  . ANEMIA-NOS 06/24/2010   Past Medical History:  Diagnosis Date  . Acid reflux   . Arthritis   . Neuromuscular disorder ([place])   . Sinus drainage   . Sleep apnea    had surgery to correct    Family History  Problem Relation Age of Onset  . Cancer Mother   . Cancer Father   . Diabetes Sister   . Colon cancer Neg Hx   . Liver disease Neg Hx     Past Surgical History:  Procedure Laterality Date  . ANTERIOR CERVICAL DECOMP/DISCECTOMY FUSION N/A 11/19/2016  Procedure: ANTERIOR CERVICAL DISCECTOMY AND FUSION C5-6 WITH PLATES, SCREWS, CAGE, VIVIGEN II;  Surgeon: Jessy Oto, MD;  Location: [facility];  Service: Orthopedics;  Laterality: N/A;  . CARPAL TUNNEL RELEASE    . CATARACT EXTRACTION, BILATERAL Bilateral 2017   One in December 2017 and one in January 2018  . COLONOSCOPY  02/15/2005   QAE:SLPNP small polyps ablated via cold biopsy, one from transverse colon and two from the rectum/Small external hemorrhoids  . COLONOSCOPY  07/10/2010   YYF:RTMYTR rectum/long redundant colon, polyps in the sigmoid, descending, hepatic flexure/ADENOMATOUS POLYPS. next TCS due 06/2015  . COLONOSCOPY  1995   3 polyps, path revealed chronic colitis  .  COLONOSCOPY N/A 08/05/2015   Procedure: COLONOSCOPY;  Surgeon: Daneil Dolin, MD;  [facility];  Service: Endoscopy;  Laterality: N/A;  1000  . ESOPHAGOGASTRODUODENOSCOPY  1995   Gastritis  . ESOPHAGOGASTRODUODENOSCOPY N/A 04/16/2013   RMR: HH  . ESOPHAGOGASTRODUODENOSCOPY N/A 05/13/2016   Procedure: ESOPHAGOGASTRODUODENOSCOPY (EGD);  Surgeon: Daneil Dolin, MD;  [facility];  Service: Endoscopy;  Laterality: N/A;  145  . ESOPHAGOGASTRODUODENOSCOPY N/A 08/18/2016   Procedure: ESOPHAGOGASTRODUODENOSCOPY (EGD);  Surgeon: Daneil Dolin, MD;  [facility];  Service: Endoscopy;  Laterality: N/A;  7:45 AM  . KNEE SURGERY     x2  . ROTATOR CUFF REPAIR     x2  . SHOULDER ARTHROSCOPY WITH ROTATOR CUFF REPAIR AND SUBACROMIAL DECOMPRESSION Right 01/21/2015   Procedure: RIGHT SHOULDER ARTHROSCOPIC DEBRIDEMNT OF G-H JOINT AND REMOVAL OF LOOSE BODIES,ARTHROSCOPIC SUBACROMIAL DECOMPRESSION,MINI OPEN RCT REPAIR WITH SUPPLEMENTAL [facility] PATCH;  Surgeon: Garald Balding, MD;  Location: [facility];  Service: Orthopedics;  Laterality: Right;  . TONSILLECTOMY    . TRIGGER FINGER RELEASE    . UVULOPALATOPHARYNGOPLASTY     Social History   Occupational History  . [employer]   Social History Main Topics  . Smoking status: Former Smoker    Packs/day: 0.10    Years: 48.00    Types: Cigarettes    Quit date: 05/11/2005  . Smokeless tobacco: Never Used  . Alcohol use No  . Drug use: No  . Sexual activity: Not on file

## 2017-05-12 NOTE — Patient Instructions (Signed)
Avoid overhead lifting and overhead use of the arms. Do not lift greater than 5 lbs. Adjust head rest in vehicle to prevent hyperextension if rear ended.

## 2017-05-20 ENCOUNTER — Ambulatory Visit: Payer: Medicare Other | Admitting: Nurse Practitioner

## 2017-05-30 ENCOUNTER — Ambulatory Visit: Payer: Medicare Other | Admitting: Nurse Practitioner

## 2017-06-14 ENCOUNTER — Other Ambulatory Visit: Payer: Self-pay | Admitting: Nurse Practitioner

## 2017-06-14 DIAGNOSIS — K5900 Constipation, unspecified: Secondary | ICD-10-CM

## 2017-06-14 DIAGNOSIS — K219 Gastro-esophageal reflux disease without esophagitis: Secondary | ICD-10-CM

## 2017-07-15 ENCOUNTER — Encounter: Payer: Self-pay | Admitting: Nurse Practitioner

## 2017-07-15 ENCOUNTER — Ambulatory Visit (INDEPENDENT_AMBULATORY_CARE_PROVIDER_SITE_OTHER): Payer: Medicare Other | Admitting: Nurse Practitioner

## 2017-07-15 VITALS — BP 121/68 | HR 85 | Temp 97.5°F | Ht 59.0 in | Wt 238.6 lb

## 2017-07-15 DIAGNOSIS — K5900 Constipation, unspecified: Secondary | ICD-10-CM

## 2017-07-15 DIAGNOSIS — R110 Nausea: Secondary | ICD-10-CM | POA: Diagnosis not present

## 2017-07-15 DIAGNOSIS — K219 Gastro-esophageal reflux disease without esophagitis: Secondary | ICD-10-CM | POA: Diagnosis not present

## 2017-07-15 MED ORDER — ONDANSETRON HCL 4 MG PO TABS: 4.0000 mg | ORAL_TABLET | Freq: Three times a day (TID) | ORAL | 0 refills | Status: DC | PRN

## 2017-07-15 NOTE — Patient Instructions (Signed)
1. Continue your present medications. 2. If you have breakthrough constipation even on Linzess you can try MiraLAX one to 2 times a day to help. 3. I sent an Zofran to help with her nausea. 4. Call your primary care provider to see if they can help with her sinus symptoms. 5. Return for follow-up in 6 months. 6. Call us if you have any questions or concerns.

## 2017-07-15 NOTE — Assessment & Plan Note (Signed)
Constipation significantly improved on Linzess 290 g daily. Recommend she continue to take this. She can use MiraLAX for any breakthrough constipations temporarily. Return for follow-up in 6 months.

## 2017-07-15 NOTE — Assessment & Plan Note (Signed)
GERD symptoms well controlled on PPI. Recommend she continue PPI at current dose. Return for follow-up in 6 months.

## 2017-07-15 NOTE — Progress Notes (Signed)
Referring Provider: Sharilyn Sites, MD Primary Care Physician:  Sharilyn Sites, MD Primary GI:  Dr. Gala Romney  Chief Complaint  Patient presents with  . Nausea    HPI:   Cheryl [name] is a 71 y.o. female who presents for follow-up on GERD and constipation. The patient was last seen in our office 02/15/2017 for the same. At her last visit she was doing okay overall but having worsening constipation. She was previously having a bowel movement once a day but at that time was having less frequent bowel movements with a sensation of incomplete emptying and straining. Has hemorrhoids, not currently with flare symptoms. Her symptoms well controlled on PPI. No other GI symptoms. Recommended continue PPI, increase Linzess to 290 g a day, return for follow-up in 3 months.  Today she states she's been sick for the past couple weeks with sinus issues and a headache. This morning it got worse, with some nausea and worsening drainage. Denies vomiting. Constipation much improved, typically has 2 BMs a day and feels like she has emptied out with each bowel movement. Had some episodic worsening when she traveled to [place], but she returned to normal when she got home. Denies hematochezia, melena, fever, chills, unintentional weight loss. GERD continues to be well controlled. Denies chest pain, dyspnea, dizziness, lightheadedness, syncope, near syncope. Denies any other upper or lower GI symptoms.  Past Medical History:  Diagnosis Date  . Acid reflux   . Arthritis   . Neuromuscular disorder ([place])   . Sinus drainage   . Sleep apnea    had surgery to correct    Past Surgical History:  Procedure Laterality Date  . ANTERIOR CERVICAL DECOMP/DISCECTOMY FUSION N/A 11/19/2016   Procedure: ANTERIOR CERVICAL DISCECTOMY AND FUSION C5-6 WITH PLATES, SCREWS, CAGE, VIVIGEN II;  Surgeon: Jessy Oto, MD;  Location: [facility];  Service: Orthopedics;  Laterality: N/A;  . CARPAL TUNNEL RELEASE    . CATARACT EXTRACTION,  BILATERAL Bilateral 2017   One in December 2017 and one in January 2018  . COLONOSCOPY  02/15/2005   IRC:VELFY small polyps ablated via cold biopsy, one from transverse colon and two from the rectum/Small external hemorrhoids  . COLONOSCOPY  07/10/2010   BOF:BPZWCH rectum/long redundant colon, polyps in the sigmoid, descending, hepatic flexure/ADENOMATOUS POLYPS. next TCS due 06/2015  . COLONOSCOPY  1995   3 polyps, path revealed chronic colitis  . COLONOSCOPY N/A 08/05/2015   Procedure: COLONOSCOPY;  Surgeon: Daneil Dolin, MD;  [facility];  Service: Endoscopy;  Laterality: N/A;  1000  . ESOPHAGOGASTRODUODENOSCOPY  1995   Gastritis  . ESOPHAGOGASTRODUODENOSCOPY N/A 04/16/2013   RMR: HH  . ESOPHAGOGASTRODUODENOSCOPY N/A 05/13/2016   Procedure: ESOPHAGOGASTRODUODENOSCOPY (EGD);  Surgeon: Daneil Dolin, MD;  [facility];  Service: Endoscopy;  Laterality: N/A;  145  . ESOPHAGOGASTRODUODENOSCOPY N/A 08/18/2016   Procedure: ESOPHAGOGASTRODUODENOSCOPY (EGD);  Surgeon: Daneil Dolin, MD;  [facility];  Service: Endoscopy;  Laterality: N/A;  7:45 AM  . KNEE SURGERY     x2  . ROTATOR CUFF REPAIR     x2  . SHOULDER ARTHROSCOPY WITH ROTATOR CUFF REPAIR AND SUBACROMIAL DECOMPRESSION Right 01/21/2015   Procedure: RIGHT SHOULDER ARTHROSCOPIC DEBRIDEMNT OF G-H JOINT AND REMOVAL OF LOOSE BODIES,ARTHROSCOPIC SUBACROMIAL DECOMPRESSION,MINI OPEN RCT REPAIR WITH SUPPLEMENTAL [facility] PATCH;  Surgeon: Garald Balding, MD;  Location: [facility];  Service: Orthopedics;  Laterality: Right;  . TONSILLECTOMY    . TRIGGER FINGER RELEASE    .  UVULOPALATOPHARYNGOPLASTY      Current Outpatient Prescriptions  Medication Sig Dispense Refill  . acetaminophen (TYLENOL) 500 MG tablet Take 500-1,000 mg by mouth every 6 (six) hours as needed (for pain/headache).    Marland Kitchen albuterol (PROVENTIL HFA;VENTOLIN HFA) 108 (90 BASE) MCG/ACT inhaler Inhale 1 puff into the lungs every 6 (six) hours as  needed for wheezing or shortness of breath.    . allopurinol (ZYLOPRIM) 300 MG tablet Take 300 mg by mouth at bedtime.    . Biotin 5000 MCG TABS Take 5,000 mcg by mouth daily.    Marland Kitchen CALCIUM-VITAMIN D PO Take 1 tablet by mouth every morning.    . febuxostat (ULORIC) 40 MG tablet Take 40 mg by mouth at bedtime.     . finasteride (PROSCAR) 5 MG tablet Take 1 mg by mouth at bedtime.    . fluticasone (FLONASE) 50 MCG/ACT nasal spray Place 1 spray into both nostrils daily as needed for allergies (sinuses).    . gabapentin (NEURONTIN) 100 MG capsule Take 100 mg by mouth at bedtime as needed (for pain).    Marland Kitchen LINZESS 290 MCG CAPS capsule TAKE 1 CAPSULE (290 MCG TOTAL) BY MOUTH DAILY BEFORE BREAKFAST. 90 capsule 3  . methocarbamol (ROBAXIN) 500 MG tablet Take 1 tablet (500 mg total) by mouth every 8 (eight) hours as needed for muscle spasms. 40 tablet 1  . montelukast (SINGULAIR) 10 MG tablet Take 10 mg by mouth every morning.    . pantoprazole (PROTONIX) 40 MG tablet Take 1 tablet (40 mg total) by mouth daily. 30 tablet 11  . raloxifene (EVISTA) 60 MG tablet Take 60 mg by mouth daily.    Marland Kitchen tolterodine (DETROL) 1 MG tablet Take 1 mg by mouth 2 (two) times daily.    . vitamin B-12 (CYANOCOBALAMIN) 1000 MCG tablet Take 1,000 mcg by mouth daily.     No current facility-administered medications for this visit.     Allergies as of 07/15/2017 - Review Complete 07/15/2017  Allergen Reaction Noted  . Prednisone Other (See Comments) 04/04/2013  . Tramadol Nausea Only 01/21/2015  . Aspirin Nausea And Vomiting   . Celecoxib Rash     Family History  Problem Relation Age of Onset  . Cancer Mother   . Cancer Father   . Diabetes Sister   . Colon cancer Neg Hx   . Liver disease Neg Hx     Social History   Social History  . Marital status: Married    Spouse name: N/A  . Number of children: 2  . Years of education: N/A   Occupational History  . [employer]   Social History Main  Topics  . Smoking status: Former Smoker    Packs/day: 0.10    Years: 48.00    Types: Cigarettes    Quit date: 05/11/2005  . Smokeless tobacco: Never Used  . Alcohol use No  . Drug use: No  . Sexual activity: Not Asked   Other Topics Concern  . None   Social History Narrative  . None    Review of Systems: General: Negative for anorexia, weight loss, fever, chills, fatigue, weakness. ENT: Admits sinus pressure/headache and sinus drainage. CV: Negative for chest pain, angina, palpitations, peripheral edema.  Respiratory: Negative for dyspnea at rest, cough, sputum, wheezing.  GI: See history of present illness. Endo: Negative for unusual weight change.  Heme: Negative for bruising or bleeding.   Physical Exam: BP 121/68   Pulse 85   Temp Marland Kitchen)  97.5 F (36.4 C) (Oral)   Ht 4\' 11"  (1.499 m)   Wt 238 lb 9.6 oz (108.2 kg)   BMI 48.19 kg/m  General:   Morbidly obese female. Alert and oriented. Pleasant and cooperative. Well-nourished and well-developed.  Head:  Normocephalic and atraumatic.  Eyes:  Without icterus, sclera clear and conjunctiva pink.  Ears:  Normal auditory acuity. Cardiovascular:  S1, S2 present without murmurs appreciated. Extremities without clubbing or edema. Respiratory:  Clear to auscultation bilaterally. No wheezes, rales, or rhonchi. No distress.  Gastrointestinal:  +BS, soft, non-tender and non-distended. No HSM noted. No guarding or rebound. No masses appreciated.  Rectal:  Deferred  Musculoskalatal:  Symmetrical without gross deformities. Neurologic:  Alert and oriented x4;  grossly normal neurologically. Psych:  Alert and cooperative. Normal mood and affect. Heme/Lymph/Immune: No excessive bruising noted.    07/15/2017 11:14 AM   Disclaimer: This note was dictated with voice recognition software. Similar sounding words can inadvertently be transcribed and may not be corrected upon review.

## 2017-07-15 NOTE — Assessment & Plan Note (Signed)
The patient is having some nausea with acute onset sinusitis, sinus headache, increased sinus drainage. Likely drainage into her stomach resulting in nausea. I have recommended she contact her primary care provider for possible treatment of sinus infection. I've made homeopathic recommendations including Steen bath, use of Steen to help break up sinus congestion. Lots of rest, lots of fluids. I will send an Zofran to help her symptomatic nausea at this time.

## 2017-07-18 NOTE — Progress Notes (Signed)
cc'ed to pcp °

## 2017-07-19 ENCOUNTER — Ambulatory Visit: Payer: Medicare Other | Admitting: Nurse Practitioner

## 2017-07-21 ENCOUNTER — Telehealth (INDEPENDENT_AMBULATORY_CARE_PROVIDER_SITE_OTHER): Payer: Self-pay | Admitting: Physical Medicine and Rehabilitation

## 2017-07-21 ENCOUNTER — Ambulatory Visit (INDEPENDENT_AMBULATORY_CARE_PROVIDER_SITE_OTHER): Payer: Medicare Other | Admitting: Orthopaedic Surgery

## 2017-07-21 ENCOUNTER — Encounter (INDEPENDENT_AMBULATORY_CARE_PROVIDER_SITE_OTHER): Payer: Self-pay | Admitting: Orthopaedic Surgery

## 2017-07-21 VITALS — BP 114/79 | HR 98 | Resp 14 | Ht 59.0 in | Wt 260.0 lb

## 2017-07-21 DIAGNOSIS — M1711 Unilateral primary osteoarthritis, right knee: Secondary | ICD-10-CM

## 2017-07-21 DIAGNOSIS — M1712 Unilateral primary osteoarthritis, left knee: Secondary | ICD-10-CM | POA: Diagnosis not present

## 2017-07-21 MED ORDER — LIDOCAINE HCL 1 % IJ SOLN
3.0000 mL | INTRAMUSCULAR | Status: AC | PRN
  Administered 2017-07-21: 3 mL

## 2017-07-21 MED ORDER — METHYLPREDNISOLONE ACETATE 40 MG/ML IJ SUSP
80.0000 mg | INTRAMUSCULAR | Status: AC | PRN
  Administered 2017-07-21: 80 mg

## 2017-07-21 MED ORDER — BUPIVACAINE HCL 0.5 % IJ SOLN
3.0000 mL | INTRAMUSCULAR | Status: AC | PRN
  Administered 2017-07-21: 3 mL via INTRA_ARTICULAR

## 2017-07-21 NOTE — Telephone Encounter (Signed)
Patient states she has been trying to call to make an appointment with Dr. Ernestina Patches and has left messages but has not heard anything back yet. Please call patient. [phone].  Thanks!

## 2017-07-21 NOTE — Progress Notes (Signed)
Office Visit Note   Patient: Cheryl [name]           Date of Birth: 1946-05-24           MRN: [id number] Visit Date: 07/21/2017              Requested by: Sharilyn Sites, [address] [place], [place] [address] PCP: Sharilyn Sites, MD   Assessment & Plan: Visit Diagnoses:  1. Unilateral primary osteoarthritis, right knee   2. Unilateral primary osteoarthritis, left knee     Plan:  #1: Corticosteroid injection to the right knee, she atraumatically #2: Follow back up 2 weeks for the left knee injection.  Follow-Up Instructions: Return in about 2 weeks (around 08/04/2017).   Orders:  No orders of the defined types were placed in this encounter.  No orders of the defined types were placed in this encounter.     Procedures: Large Joint Inj Date/Time: 07/21/2017 5:30 PM Performed by: Biagio Borg D Authorized by: Biagio Borg D   Consent Given by:  Patient Timeout: prior to procedure the correct patient, procedure, and site was verified   Indications:  Pain and joint swelling Location:  Knee Site:  R knee Prep: patient was prepped and draped in usual sterile fashion   Needle Size:  25 G Needle Length:  1.5 inches Approach:  Anteromedial Ultrasound Guidance: No   Fluoroscopic Guidance: No   Arthrogram: No   Medications:  80 mg methylPREDNISolone acetate 40 MG/ML; 3 mL bupivacaine 0.5 %; 3 mL lidocaine 1 % Aspiration Attempted: No   Patient tolerance:  Patient tolerated the procedure well with no immediate complications     Clinical Data: No additional findings.   Subjective: Chief Complaint  Patient presents with  . Right Knee - Pain    Cheryl [name] is a 7 y old that presents with Bil knee pain. She relates she would like to have a cortisone injection in her R knee today. Last one 04/08/17 and her Left knee 04/20/17 and the cortisone provided relief  . Left Knee - Pain    HPI  Cheryl [name] is a very pleasant 71 year old African-American female who is seen  today for bilateral knee pain. She has been seen previously on 04/08/2017 at that time and had a corticosteroid injection to the right knee. About 2 weeks later she had one to the left knee. She was doing well until recently and comes in today complaining of bilateral knee pain right much greater than left. She's already been diagnosed with osteoarthritis of both knees. She would like to consider corticosteroid injection of both knees.  Review of Systems  History of Hashimoto's thyroiditis, and GERD   Objective: Vital Signs: BP 114/79   Pulse 98   Resp 14   Ht 4\' 11"  (1.499 m)   Wt 260 lb (117.9 kg)   BMI 52.51 kg/m   Physical Exam  Constitutional: She is oriented to person, place, and time. She appears well-developed and well-nourished.  HENT:  Head: Normocephalic and atraumatic.  Eyes: Pupils are equal, round, and reactive to light. EOM are normal.  Pulmonary/Chest: Effort normal.  Neurological: She is alert and oriented to person, place, and time.  Skin: Skin is warm and dry.  Psychiatric: She has a normal mood and affect. Her behavior is normal. Judgment and thought content normal.    Ortho Exam  Right knee today reveals possible fusion. This is a mild. Difficult to tell secondary to adiposity. She does have crepitus with range  of motion. Ligamentously stable.   Specialty Comments:  No specialty comments available.  Imaging: No results found.   PMFS History: Patient Active Problem List   Diagnosis Date Noted  . Herniated cervical intervertebral disc 11/19/2016  . Cervical disc disorder at C5-C6 level with radiculopathy 10/21/2016  . Gastric ulceration   . GERD (gastroesophageal reflux disease) 05/11/2016  . Constipation 05/11/2016  . Hashimoto's thyroiditis 02/20/2016  . History of colonic polyps   . Diverticulosis of colon without hemorrhage   . Complete tear of right rotator cuff 01/21/2015  . Severe obesity (BMI >= 40) ([place]) 01/21/2015  . Nausea without  vomiting 04/04/2013  . H/O adenomatous polyp of colon 04/04/2013  . Fatty liver 11/11/2010  . RUQ PAIN 11/11/2010  . NONSPEC ELEVATION OF LEVELS OF TRANSAMINASE/LDH 11/11/2010  . COLONIC POLYPS, HYPERPLASTIC, HX OF 06/25/2010  . ANEMIA-NOS 06/24/2010   Past Medical History:  Diagnosis Date  . Acid reflux   . Arthritis   . Neuromuscular disorder ([place])   . Sinus drainage   . Sleep apnea    had surgery to correct    Family History  Problem Relation Age of Onset  . Cancer Mother   . Cancer Father   . Diabetes Sister   . Colon cancer Neg Hx   . Liver disease Neg Hx     Past Surgical History:  Procedure Laterality Date  . ANTERIOR CERVICAL DECOMP/DISCECTOMY FUSION N/A 11/19/2016   Procedure: ANTERIOR CERVICAL DISCECTOMY AND FUSION C5-6 WITH PLATES, SCREWS, CAGE, VIVIGEN II;  Surgeon: Jessy Oto, MD;  Location: [facility];  Service: Orthopedics;  Laterality: N/A;  . CARPAL TUNNEL RELEASE    . CATARACT EXTRACTION, BILATERAL Bilateral 2017   One in December 2017 and one in January 2018  . COLONOSCOPY  02/15/2005   TFT:DDUKG small polyps ablated via cold biopsy, one from transverse colon and two from the rectum/Small external hemorrhoids  . COLONOSCOPY  07/10/2010   URK:YHCWCB rectum/long redundant colon, polyps in the sigmoid, descending, hepatic flexure/ADENOMATOUS POLYPS. next TCS due 06/2015  . COLONOSCOPY  1995   3 polyps, path revealed chronic colitis  . COLONOSCOPY N/A 08/05/2015   Procedure: COLONOSCOPY;  Surgeon: Daneil Dolin, MD;  [facility];  Service: Endoscopy;  Laterality: N/A;  1000  . ESOPHAGOGASTRODUODENOSCOPY  1995   Gastritis  . ESOPHAGOGASTRODUODENOSCOPY N/A 04/16/2013   RMR: HH  . ESOPHAGOGASTRODUODENOSCOPY N/A 05/13/2016   Procedure: ESOPHAGOGASTRODUODENOSCOPY (EGD);  Surgeon: Daneil Dolin, MD;  [facility];  Service: Endoscopy;  Laterality: N/A;  145  . ESOPHAGOGASTRODUODENOSCOPY N/A 08/18/2016   Procedure: ESOPHAGOGASTRODUODENOSCOPY  (EGD);  Surgeon: Daneil Dolin, MD;  [facility];  Service: Endoscopy;  Laterality: N/A;  7:45 AM  . KNEE SURGERY     x2  . ROTATOR CUFF REPAIR     x2  . SHOULDER ARTHROSCOPY WITH ROTATOR CUFF REPAIR AND SUBACROMIAL DECOMPRESSION Right 01/21/2015   Procedure: RIGHT SHOULDER ARTHROSCOPIC DEBRIDEMNT OF G-H JOINT AND REMOVAL OF LOOSE BODIES,ARTHROSCOPIC SUBACROMIAL DECOMPRESSION,MINI OPEN RCT REPAIR WITH SUPPLEMENTAL [facility] PATCH;  Surgeon: Garald Balding, MD;  Location: [facility];  Service: Orthopedics;  Laterality: Right;  . TONSILLECTOMY    . TRIGGER FINGER RELEASE    . UVULOPALATOPHARYNGOPLASTY     Social History   Occupational History  . [employer]   Social History Main Topics  . Smoking status: Former Smoker    Packs/day: 0.10    Years: 48.00    Types: Cigarettes  Quit date: 05/11/2005  . Smokeless tobacco: Never Used  . Alcohol use No  . Drug use: No  . Sexual activity: Not on file

## 2017-07-22 DIAGNOSIS — J069 Acute upper respiratory infection, unspecified: Secondary | ICD-10-CM | POA: Diagnosis not present

## 2017-07-22 DIAGNOSIS — R7309 Other abnormal glucose: Secondary | ICD-10-CM | POA: Diagnosis not present

## 2017-07-22 DIAGNOSIS — J329 Chronic sinusitis, unspecified: Secondary | ICD-10-CM | POA: Diagnosis not present

## 2017-07-22 DIAGNOSIS — Z1389 Encounter for screening for other disorder: Secondary | ICD-10-CM | POA: Diagnosis not present

## 2017-07-22 DIAGNOSIS — Z6841 Body Mass Index (BMI) 40.0 and over, adult: Secondary | ICD-10-CM | POA: Diagnosis not present

## 2017-07-25 NOTE — Telephone Encounter (Signed)
She had cervical fusion in 11/2016 by Dr. Louanne Skye and has seen him in the last couple of months? She would need to see if he thought it was right thing to do?

## 2017-07-25 NOTE — Telephone Encounter (Signed)
Please advise 

## 2017-07-26 NOTE — Telephone Encounter (Signed)
Patient is requesting an injection with Dr. Ernestina Patches for her Neck---Please advise

## 2017-07-29 NOTE — Telephone Encounter (Signed)
I do not usually recommend shots in the neck after a fusion has been done, usually the fusion is enough to relieve the pain, probably should see her back before consideing any cervical spine injections. jen Schedule her an evaluation of her cervical spine for pain post fusion. jen

## 2017-08-02 ENCOUNTER — Ambulatory Visit (INDEPENDENT_AMBULATORY_CARE_PROVIDER_SITE_OTHER): Payer: Medicare Other | Admitting: Orthopaedic Surgery

## 2017-08-02 ENCOUNTER — Encounter (INDEPENDENT_AMBULATORY_CARE_PROVIDER_SITE_OTHER): Payer: Self-pay | Admitting: Orthopaedic Surgery

## 2017-08-02 VITALS — BP 154/84 | HR 85 | Ht 59.0 in | Wt 260.0 lb

## 2017-08-02 DIAGNOSIS — M1712 Unilateral primary osteoarthritis, left knee: Secondary | ICD-10-CM

## 2017-08-02 MED ORDER — LIDOCAINE HCL 1 % IJ SOLN
3.0000 mL | INTRAMUSCULAR | Status: AC | PRN
Start: 2017-08-02 — End: 2017-08-02
  Administered 2017-08-02: 3 mL

## 2017-08-02 MED ORDER — METHYLPREDNISOLONE ACETATE 40 MG/ML IJ SUSP
80.0000 mg | INTRAMUSCULAR | Status: AC | PRN
  Administered 2017-08-02: 80 mg

## 2017-08-02 MED ORDER — DICLOFENAC SODIUM 1 % TD GEL: 2.0000 g | Freq: Four times a day (QID) | TRANSDERMAL | 3 refills | Status: DC

## 2017-08-02 MED ORDER — BUPIVACAINE HCL 0.5 % IJ SOLN
3.0000 mL | INTRAMUSCULAR | Status: AC | PRN
  Administered 2017-08-02: 3 mL via INTRA_ARTICULAR

## 2017-08-02 NOTE — Progress Notes (Signed)
Office Visit Note   Patient: Cheryl [name]           Date of Birth: May 11, 1946           MRN: [id number] Visit Date: 08/02/2017              Requested by: Sharilyn Sites, [address] [place], [place] [address] PCP: Sharilyn Sites, MD   Assessment & Plan: Visit Diagnoses:  1. Unilateral primary osteoarthritis, left knee     Plan: #1: Corticosteroid injections left knee #2: Prescription for Voltaren gel that she can use to her knees #3: Follow back up when necessary  Follow-Up Instructions: Return if symptoms worsen or fail to improve.   Orders:  Orders Placed This Encounter  Procedures  . Large Joint Injection/Arthrocentesis   Meds ordered this encounter  Medications  . diclofenac sodium (VOLTAREN) 1 % GEL    Sig: Apply 2-4 g topically 4 (four) times daily.    Dispense:  5 Tube    Refill:  3    Order Specific Question:   Supervising Provider    Answer:   Garald Balding [[id number]]      Procedures: Large Joint Inj Date/Time: 08/02/2017 1:53 PM Performed by: Biagio Borg D Authorized by: Biagio Borg D   Consent Given by:  Patient Timeout: prior to procedure the correct patient, procedure, and site was verified   Indications:  Pain and joint swelling Location:  Knee Site:  L knee Prep: patient was prepped and draped in usual sterile fashion   Needle Size:  25 G Needle Length:  1.5 inches Approach:  Anteromedial Ultrasound Guidance: No   Fluoroscopic Guidance: No   Arthrogram: No   Medications:  80 mg methylPREDNISolone acetate 40 MG/ML; 3 mL bupivacaine 0.5 %; 3 mL lidocaine 1 % Aspiration Attempted: No   Patient tolerance:  Patient tolerated the procedure well with no immediate complications     Clinical Data: No additional findings.   Subjective: Chief Complaint  Patient presents with  . Left Knee - Injections, Pain    Cheryl [name] is a very pleasant 71 year old African-American female who is seen today for evaluation of her left knee.  She had been seen several weeks ago and did have a corticosteroid injection to the right knee which was beneficial. She comes in today stating the left knee is now painful. She is already been diagnosed with osteoarthritis of the left knee previously. She would like to consider corticosteroid injection. She did have good results with the injection to the right knee at her last visit.     Review of Systems  Constitutional: Negative for chills, fatigue and fever.  Eyes: Negative for itching.  Respiratory: Negative for chest tightness and shortness of breath.   Cardiovascular: Negative for chest pain, palpitations and leg swelling.  Gastrointestinal: Negative for blood in stool, constipation and diarrhea.  Musculoskeletal: Negative for back pain, joint swelling, neck pain and neck stiffness.  Neurological: Negative for dizziness, weakness, numbness and headaches.  Hematological: Does not bruise/bleed easily.  Psychiatric/Behavioral: Negative for sleep disturbance. The patient is not nervous/anxious.      Objective: Vital Signs: BP (!) 154/84   Pulse 85   Ht 4\' 11"  (1.499 m)   Wt 260 lb (117.9 kg)   BMI 52.51 kg/m   Physical Exam  Constitutional: She is oriented to person, place, and time. She appears well-developed and well-nourished.  HENT:  Head: Normocephalic and atraumatic.  Eyes: Pupils are equal, round, and reactive to light.  EOM are normal.  Pulmonary/Chest: Effort normal.  Neurological: She is alert and oriented to person, place, and time.  Skin: Skin is warm and dry.  Psychiatric: She has a normal mood and affect. Her behavior is normal. Judgment and thought content normal.    Ortho Exam  Exam  right knee today reveals predominantly medial joint pain. Positive patellar crepitation. No effusion though can't tell for sure. No instability. No popliteal pain or calf discomfort. Painless range of motion of both hips.  Specialty Comments:  No specialty comments  available.  Imaging: No results found.   PMFS History: Patient Active Problem List   Diagnosis Date Noted  . Herniated cervical intervertebral disc 11/19/2016  . Cervical disc disorder at C5-C6 level with radiculopathy 10/21/2016  . Gastric ulceration   . GERD (gastroesophageal reflux disease) 05/11/2016  . Constipation 05/11/2016  . Hashimoto's thyroiditis 02/20/2016  . History of colonic polyps   . Diverticulosis of colon without hemorrhage   . Complete tear of right rotator cuff 01/21/2015  . Severe obesity (BMI >= 40) ([place]) 01/21/2015  . Nausea without vomiting 04/04/2013  . H/O adenomatous polyp of colon 04/04/2013  . Fatty liver 11/11/2010  . RUQ PAIN 11/11/2010  . NONSPEC ELEVATION OF LEVELS OF TRANSAMINASE/LDH 11/11/2010  . COLONIC POLYPS, HYPERPLASTIC, HX OF 06/25/2010  . ANEMIA-NOS 06/24/2010   Past Medical History:  Diagnosis Date  . Acid reflux   . Arthritis   . Neuromuscular disorder ([place])   . Sinus drainage   . Sleep apnea    had surgery to correct    Family History  Problem Relation Age of Onset  . Cancer Mother   . Cancer Father   . Diabetes Sister   . Colon cancer Neg Hx   . Liver disease Neg Hx     Past Surgical History:  Procedure Laterality Date  . ANTERIOR CERVICAL DECOMP/DISCECTOMY FUSION N/A 11/19/2016   Procedure: ANTERIOR CERVICAL DISCECTOMY AND FUSION C5-6 WITH PLATES, SCREWS, CAGE, VIVIGEN II;  Surgeon: Jessy Oto, MD;  Location: [facility];  Service: Orthopedics;  Laterality: N/A;  . CARPAL TUNNEL RELEASE    . CATARACT EXTRACTION, BILATERAL Bilateral 2017   One in December 2017 and one in January 2018  . COLONOSCOPY  02/15/2005   VZD:GLOVF small polyps ablated via cold biopsy, one from transverse colon and two from the rectum/Small external hemorrhoids  . COLONOSCOPY  07/10/2010   IEP:PIRJJO rectum/long redundant colon, polyps in the sigmoid, descending, hepatic flexure/ADENOMATOUS POLYPS. next TCS due 06/2015  . COLONOSCOPY  1995   3  polyps, path revealed chronic colitis  . COLONOSCOPY N/A 08/05/2015   Procedure: COLONOSCOPY;  Surgeon: Daneil Dolin, MD;  [facility];  Service: Endoscopy;  Laterality: N/A;  1000  . ESOPHAGOGASTRODUODENOSCOPY  1995   Gastritis  . ESOPHAGOGASTRODUODENOSCOPY N/A 04/16/2013   RMR: HH  . ESOPHAGOGASTRODUODENOSCOPY N/A 05/13/2016   Procedure: ESOPHAGOGASTRODUODENOSCOPY (EGD);  Surgeon: Daneil Dolin, MD;  [facility];  Service: Endoscopy;  Laterality: N/A;  145  . ESOPHAGOGASTRODUODENOSCOPY N/A 08/18/2016   Procedure: ESOPHAGOGASTRODUODENOSCOPY (EGD);  Surgeon: Daneil Dolin, MD;  [facility];  Service: Endoscopy;  Laterality: N/A;  7:45 AM  . KNEE SURGERY     x2  . ROTATOR CUFF REPAIR     x2  . SHOULDER ARTHROSCOPY WITH ROTATOR CUFF REPAIR AND SUBACROMIAL DECOMPRESSION Right 01/21/2015   Procedure: RIGHT SHOULDER ARTHROSCOPIC DEBRIDEMNT OF G-H JOINT AND REMOVAL OF LOOSE BODIES,ARTHROSCOPIC SUBACROMIAL DECOMPRESSION,MINI OPEN RCT REPAIR  WITH SUPPLEMENTAL [facility] PATCH;  Surgeon: Garald Balding, MD;  Location: [facility];  Service: Orthopedics;  Laterality: Right;  . TONSILLECTOMY    . TRIGGER FINGER RELEASE    . UVULOPALATOPHARYNGOPLASTY     Social History   Occupational History  . [employer]   Social History Main Topics  . Smoking status: Former Smoker    Packs/day: 0.10    Years: 48.00    Types: Cigarettes    Quit date: 05/11/2005  . Smokeless tobacco: Never Used  . Alcohol use No  . Drug use: No  . Sexual activity: Not on file

## 2017-08-03 ENCOUNTER — Ambulatory Visit (INDEPENDENT_AMBULATORY_CARE_PROVIDER_SITE_OTHER): Payer: Medicare Other | Admitting: Orthopedic Surgery

## 2017-08-04 MED ORDER — DIAZEPAM 5 MG PO TABS: ORAL_TABLET | ORAL | 0 refills | Status: DC

## 2017-08-04 NOTE — Telephone Encounter (Signed)
1, ok to do L5-S1 facet blocks, 2) can you check with Amy to see if we did get a fax/or referral from [facility]?

## 2017-08-04 NOTE — Telephone Encounter (Signed)
Please see last message from Dr. Ernestina Patches

## 2017-08-04 NOTE — Telephone Encounter (Signed)
Patient is needing and injection in her lower back not her neck.  She states that she is having pain in her left side of her back. She also says she is a referral from Dr. Ace Gins. Please advise.

## 2017-08-04 NOTE — Telephone Encounter (Signed)
Faxed to [phone]

## 2017-08-04 NOTE — Telephone Encounter (Signed)
Printed RX

## 2017-08-04 NOTE — Addendum Note (Signed)
Addended by: Raymondo Band on: 08/04/2017 02:49 PM   Modules accepted: Orders

## 2017-08-15 ENCOUNTER — Encounter (INDEPENDENT_AMBULATORY_CARE_PROVIDER_SITE_OTHER): Payer: Self-pay | Admitting: Physical Medicine and Rehabilitation

## 2017-08-15 ENCOUNTER — Ambulatory Visit (INDEPENDENT_AMBULATORY_CARE_PROVIDER_SITE_OTHER): Payer: Medicare Other

## 2017-08-15 ENCOUNTER — Ambulatory Visit (INDEPENDENT_AMBULATORY_CARE_PROVIDER_SITE_OTHER): Payer: Medicare Other | Admitting: Physical Medicine and Rehabilitation

## 2017-08-15 VITALS — BP 129/80 | HR 74 | Temp 98.0°F

## 2017-08-15 DIAGNOSIS — G8929 Other chronic pain: Secondary | ICD-10-CM

## 2017-08-15 DIAGNOSIS — M47816 Spondylosis without myelopathy or radiculopathy, lumbar region: Secondary | ICD-10-CM | POA: Diagnosis not present

## 2017-08-15 DIAGNOSIS — M545 Low back pain, unspecified: Secondary | ICD-10-CM

## 2017-08-15 DIAGNOSIS — M25552 Pain in left hip: Secondary | ICD-10-CM | POA: Diagnosis not present

## 2017-08-15 MED ORDER — LIDOCAINE HCL (PF) 1 % IJ SOLN
2.0000 mL | Freq: Once | INTRAMUSCULAR | Status: AC
  Administered 2017-08-15: 2 mL

## 2017-08-15 MED ORDER — METHYLPREDNISOLONE ACETATE 80 MG/ML IJ SUSP
80.0000 mg | Freq: Once | INTRAMUSCULAR | Status: AC
  Administered 2017-08-15: 80 mg

## 2017-08-15 NOTE — Patient Instructions (Signed)

## 2017-08-15 NOTE — Progress Notes (Signed)
NILANI HUGILL - 71 y.o. female MRN [id number]  Date of birth: 06/08/1946  Office Visit Note: Visit Date: 08/15/2017 PCP: Sharilyn Sites, MD Referred by: Sharilyn Sites, MD  Subjective: Chief Complaint  Patient presents with  . Lower Back - Pain   HPI: Mrs. Crader is a very pleasant 71 year old female who is obese with chronic history of low back pain for many years. She most recently has been followed by Dr. Louanne Skye in our office and has had ACDF of the cervical spine just a few months ago. She reports doing well with her neck at this point. Over the years I have seen her for her lower back and her neck mainly to the orthopedic surgeons in our office and through Dr. Louanne Skye for her cervical spine. Somewhere in the interim she was seeing Dr. Niel Hummer. Unfortunately I have notes to review but the patient tells me that Dr. Niel Hummer had looked at facet joint injections and had talked about radiofrequency ablation. Looking back in our notes in the other electronic medical record we had completed facet joint blocks with good relief. The patient reports axial low back pain really more on the left side. He can come and go with simple maneuvers such as grabbing a hymnal at church and trying to stand. She doesn't relate it to any specific movement however. She denies any radicular pain. She'll have some pain that just shoots from the left side across the back but mainly is chronic nagging pain on the left side. She has tried anti-inflammatories and pain medications and muscle relaxers without relief. She has had physical therapy in the past but not specifically recently. She's had no focal weakness or trauma or falls.he did have an MRI from 2011 which mainly showedmild grade 1 listhesis of L5 on S1 pars defects.there is also epidural lipomatosis but no real impingement at L5-S1. She did have a more recent lumbar spine4 view x-ray that mainly showed the same pain with listhesis of L5 on S1 and degenerative  facet arthropathy.    Review of Systems  Constitutional: Negative for chills, fever, malaise/fatigue and weight loss.  HENT: Negative for hearing loss and sinus pain.   Eyes: Negative for blurred vision, double vision and photophobia.  Respiratory: Negative for cough and shortness of breath.   Cardiovascular: Negative for chest pain, palpitations and leg swelling.  Gastrointestinal: Negative for abdominal pain, nausea and vomiting.  Genitourinary: Negative for flank pain.  Musculoskeletal: Positive for back pain. Negative for myalgias.  Skin: Negative for itching and rash.  Neurological: Negative for tremors, focal weakness and weakness.  Endo/Heme/Allergies: Negative.   Psychiatric/Behavioral: Negative for depression.  All other systems reviewed and are negative.  Otherwise per HPI.  Assessment & Plan: Visit Diagnoses:  1. Spondylosis without myelopathy or radiculopathy, lumbar region   2. Chronic left-sided low back pain without sciatica   3. Pain in left hip     Plan: Findings:  Chronic history of off and on back pain with history of listhesis of L5 on S1 without stenosis or pars defects. She's having no radicular complaints but worsening severe low back pain particularly on the left. Pain can be nagging at times and then severe and sharp. Does not seem to be helped with activity modification or anti-inflammatories. I do think it would be reasonable to complete bilateral facet joint blocks at L5-S1 and we could do that today base of her symptoms. Diagnostically if this helps her we could go down the route of radiofrequency ablation  at some point. I would recommend regrouping with physical therapy for core strengthening and possibly look at dry needling of trigger points. Conversely she can follow up with Dr. Louanne Skye as well who she sees for her neck.he next step may be updated MRI of the lumbar spine if this worsens.    Meds & Orders:  Meds ordered this encounter  Medications  .  lidocaine (PF) (XYLOCAINE) 1 % injection 2 mL  . methylPREDNISolone acetate (DEPO-MEDROL) injection 80 mg    Orders Placed This Encounter  Procedures  . Facet Injection  . XR C-ARM NO REPORT    Follow-up: Return if symptoms worsen or fail to improve.   Procedures: No procedures performed  Lumbar Facet Joint Intra-Articular Injection(s) with Fluoroscopic Guidance  Patient: RASHELLE IRELAND      Date of Birth: [birth year] MRN: [id number] PCP: Sharilyn Sites, MD      Visit Date: 08/15/2017   Universal Protocol:    Date/Time: 08/15/2017  Consent Given By: the patient  Position: PRONE   Additional Comments: Vital signs were monitored before and after the procedure. Patient was prepped and draped in the usual sterile fashion. The correct patient, procedure, and site was verified.   Injection Procedure Details:  Procedure Site One Meds Administered:  Meds ordered this encounter  Medications  . lidocaine (PF) (XYLOCAINE) 1 % injection 2 mL  . methylPREDNISolone acetate (DEPO-MEDROL) injection 80 mg     Laterality: Left  Location/Site:  L5-S1  Needle size: 22 guage  Needle type: Spinal  Needle Placement: Articular  Findings:  -Contrast Used: 0.5 mL iohexol 180 mg iodine/mL   -Comments: Excellent flow of contrast producing a partial arthrogram.  Procedure Details: The fluoroscope beam is vertically oriented in AP, and the inferior recess is visualized beneath the lower pole of the inferior apophyseal process, which represents the target point for needle insertion. When direct visualization is difficult the target point is located at the medial projection of the vertebral pedicle. The region overlying each aforementioned target is locally anesthetized with a 1 to 2 ml. volume of 1% Lidocaine without Epinephrine.   The spinal needle was inserted into each of the above mentioned facet joints using biplanar fluoroscopic guidance. A 0.25 to 0.5 ml. volume of Isovue-250 was  injected and a partial facet joint arthrogram was obtained. A single spot film was obtained of the resulting arthrogram.    One to 1.25 ml of the steroid/anesthetic solution was then injected into each of the facet joints noted above.   Additional Comments:  The patient tolerated the procedure well Dressing: Band-Aid    Post-procedure details: Patient was observed during the procedure. Post-procedure instructions were reviewed.  Patient left the clinic in stable condition.     Clinical History: LUMBAR SPINE - COMPLETE 4+ VIEW  COMPARISON:  None.  FINDINGS: Degenerative facet disease in the lower lumbar spine. Slight anterolisthesis of L5 on S1. No fracture. Disc spaces are maintained. SI joints are symmetric and unremarkable.  IMPRESSION: Degenerative facet disease in the lower lumbar spine. No acute findings.   Electronically Signed   By: Rolm Baptise M.D.   On: 03/31/2016 21:39  MRI LUMBAR SPINE WITHOUT CONTRAST 01/10/2010  Technique: Multiplanar and multiecho pulse sequences of the lumbar spine were obtained without intravenous contrast.  Comparison: None  Findings: The lowest full intervertebral disk space is labeled L5- S1. If procedural intervention is to be performed, careful correlation with this numbering strategy is recommended.  The conus medullaris appears unremarkable. Conus level:  L1-2.  T2 hyperintense, T1 hypointense renal lesions are statistically likely to represent cysts but are not fully evaluated on today's lumbar spine MRI.  There is 3 mm of degenerative anterior subluxation of L5 on S1. No pars defects identified. Lumbar vertebral alignment is otherwise unremarkable.  A small hemangioma is present in the L2 vertebral body.  Inversion recovery weighted images demonstrate no significant abnormal vertebral or periligamentous edema.  Additional findings at individual levels are as follows:  L2-3: Unremarkable.  L3-4:  Unremarkable.  L4-5: Mild disc bulge. No significant stenosis.  L5-S1: Prominence the lumbar epidural adipose tissues noted. No impingement identified. Low T1 epidural signal posterior to the L5 vertebral body is thought to likely represent epidural venous plexus.  IMPRESSION:  1. No specific impingement identified. 2. Heterogeneous signal in the epidural space posterior to the L5 vertebral body is thought to represent venous plexus. I have reviewed this appearance with Dr. Barnet Glasgow, who concurs.  She reports that she quit smoking about 12 years ago. Her smoking use included Cigarettes. She has a 4.80 pack-year smoking history. She has never used smokeless tobacco. No results for input(s): HGBA1C, LABURIC in the last 8760 hours.  Objective:  VS:  HT:    WT:   BMI:     BP:129/80  HR:74bpm  TEMP:98 F (36.7 C)(Oral)  RESP:98 % Physical Exam  Constitutional: She is oriented to person, place, and time. She appears well-developed and well-nourished.  Obese  Eyes: Pupils are equal, round, and reactive to light. Conjunctivae and EOM are normal.  Cardiovascular: Normal rate and intact distal pulses.   Pulmonary/Chest: Effort normal.  Musculoskeletal:  Patient has a lot of pain in the low back going from sit to stand with extension. She has no pain over the greater trochanters. No pain with hip rotation. She has good distal strength without clonus.  Neurological: She is alert and oriented to person, place, and time. She exhibits normal muscle tone.  Skin: Skin is warm and dry. No rash noted. No erythema.  Psychiatric: She has a normal mood and affect. Her behavior is normal.  Nursing note and vitals reviewed.   Ortho Exam Imaging: Xr C-arm No Report  Result Date: 08/15/2017 Please see Notes or Procedures tab for imaging impression.   Past Medical/Family/Surgical/Social History: Medications & Allergies reviewed per EMR Patient Active Problem List   Diagnosis Date Noted  .  Herniated cervical intervertebral disc 11/19/2016  . Cervical disc disorder at C5-C6 level with radiculopathy 10/21/2016  . Gastric ulceration   . GERD (gastroesophageal reflux disease) 05/11/2016  . Constipation 05/11/2016  . Hashimoto's thyroiditis 02/20/2016  . History of colonic polyps   . Diverticulosis of colon without hemorrhage   . Complete tear of right rotator cuff 01/21/2015  . Severe obesity (BMI >= 40) ([place]) 01/21/2015  . Nausea without vomiting 04/04/2013  . H/O adenomatous polyp of colon 04/04/2013  . Fatty liver 11/11/2010  . RUQ PAIN 11/11/2010  . NONSPEC ELEVATION OF LEVELS OF TRANSAMINASE/LDH 11/11/2010  . COLONIC POLYPS, HYPERPLASTIC, HX OF 06/25/2010  . ANEMIA-NOS 06/24/2010   Past Medical History:  Diagnosis Date  . Acid reflux   . Arthritis   . Neuromuscular disorder ([place])   . Sinus drainage   . Sleep apnea    had surgery to correct   Family History  Problem Relation Age of Onset  . Cancer Mother   . Cancer Father   . Diabetes Sister   . Colon cancer Neg Hx   . Liver  disease Neg Hx    Past Surgical History:  Procedure Laterality Date  . ANTERIOR CERVICAL DECOMP/DISCECTOMY FUSION N/A 11/19/2016   Procedure: ANTERIOR CERVICAL DISCECTOMY AND FUSION C5-6 WITH PLATES, SCREWS, CAGE, VIVIGEN II;  Surgeon: Jessy Oto, MD;  Location: [place];  Service: Orthopedics;  Laterality: N/A;  . CARPAL TUNNEL RELEASE    . CATARACT EXTRACTION, BILATERAL Bilateral 2017   One in December 2017 and one in January 2018  . COLONOSCOPY  02/15/2005   QXI:HWTUU small polyps ablated via cold biopsy, one from transverse colon and two from the rectum/Small external hemorrhoids  . COLONOSCOPY  07/10/2010   EKC:[id number] rectum/long redundant colon, polyps in the sigmoid, descending, hepatic flexure/ADENOMATOUS POLYPS. next TCS due 06/2015  . COLONOSCOPY  1995   3 polyps, path revealed chronic colitis  . COLONOSCOPY N/A 08/05/2015   Procedure: COLONOSCOPY;  Surgeon: Daneil Dolin,  MD;  [facility];  Service: Endoscopy;  Laterality: N/A;  1000  . ESOPHAGOGASTRODUODENOSCOPY  1995   Gastritis  . ESOPHAGOGASTRODUODENOSCOPY N/A 04/16/2013   RMR: HH  . ESOPHAGOGASTRODUODENOSCOPY N/A 05/13/2016   Procedure: ESOPHAGOGASTRODUODENOSCOPY (EGD);  Surgeon: Daneil Dolin, MD;  [facility];  Service: Endoscopy;  Laterality: N/A;  145  . ESOPHAGOGASTRODUODENOSCOPY N/A 08/18/2016   Procedure: ESOPHAGOGASTRODUODENOSCOPY (EGD);  Surgeon: Daneil Dolin, MD;  [facility];  Service: Endoscopy;  Laterality: N/A;  7:45 AM  . KNEE SURGERY     x2  . ROTATOR CUFF REPAIR     x2  . SHOULDER ARTHROSCOPY WITH ROTATOR CUFF REPAIR AND SUBACROMIAL DECOMPRESSION Right 01/21/2015   Procedure: RIGHT SHOULDER ARTHROSCOPIC DEBRIDEMNT OF G-H JOINT AND REMOVAL OF LOOSE BODIES,ARTHROSCOPIC SUBACROMIAL DECOMPRESSION,MINI OPEN RCT REPAIR WITH SUPPLEMENTAL [facility] PATCH;  Surgeon: Garald Balding, MD;  Location: [place];  Service: Orthopedics;  Laterality: Right;  . TONSILLECTOMY    . TRIGGER FINGER RELEASE    . UVULOPALATOPHARYNGOPLASTY     Social History   Occupational History  . [employer]   Social History Main Topics  . Smoking status: Former Smoker    Packs/day: 0.10    Years: 48.00    Types: Cigarettes    Quit date: 05/11/2005  . Smokeless tobacco: Never Used  . Alcohol use No  . Drug use: No  . Sexual activity: Not on file

## 2017-08-15 NOTE — Procedures (Signed)
Lumbar Facet Joint Intra-Articular Injection(s) with Fluoroscopic Guidance  Patient: Cheryl [name]      Date of Birth: October 17, 1946 MRN: [id number] PCP: Sharilyn Sites, MD      Visit Date: 08/15/2017   Universal Protocol:    Date/Time: 08/15/2017  Consent Given By: the patient  Position: PRONE   Additional Comments: Vital signs were monitored before and after the procedure. Patient was prepped and draped in the usual sterile fashion. The correct patient, procedure, and site was verified.   Injection Procedure Details:  Procedure Site One Meds Administered:  Meds ordered this encounter  Medications  . lidocaine (PF) (XYLOCAINE) 1 % injection 2 mL  . methylPREDNISolone acetate (DEPO-MEDROL) injection 80 mg     Laterality: Left  Location/Site:  L5-S1  Needle size: 22 guage  Needle type: Spinal  Needle Placement: Articular  Findings:  -Contrast Used: 0.5 mL iohexol 180 mg iodine/mL   -Comments: Excellent flow of contrast producing a partial arthrogram.  Procedure Details: The fluoroscope beam is vertically oriented in AP, and the inferior recess is visualized beneath the lower pole of the inferior apophyseal process, which represents the target point for needle insertion. When direct visualization is difficult the target point is located at the medial projection of the vertebral pedicle. The region overlying each aforementioned target is locally anesthetized with a 1 to 2 ml. volume of 1% Lidocaine without Epinephrine.   The spinal needle was inserted into each of the above mentioned facet joints using biplanar fluoroscopic guidance. A 0.25 to 0.5 ml. volume of Isovue-250 was injected and a partial facet joint arthrogram was obtained. A single spot film was obtained of the resulting arthrogram.    One to 1.25 ml of the steroid/anesthetic solution was then injected into each of the facet joints noted above.   Additional Comments:  The patient tolerated the procedure  well Dressing: Band-Aid    Post-procedure details: Patient was observed during the procedure. Post-procedure instructions were reviewed.  Patient left the clinic in stable condition.

## 2017-08-15 NOTE — Progress Notes (Deleted)
Left side low back pain. Comes and goes. Doesn't relate to any certain movement or activity. No leg pain. Says occosional  she will have pain that shoots across lower back but typically pain is just on the left side.

## 2017-08-16 ENCOUNTER — Encounter (INDEPENDENT_AMBULATORY_CARE_PROVIDER_SITE_OTHER): Payer: Self-pay | Admitting: Physical Medicine and Rehabilitation

## 2017-09-19 DIAGNOSIS — Z23 Encounter for immunization: Secondary | ICD-10-CM | POA: Diagnosis not present

## 2017-09-29 NOTE — Progress Notes (Signed)
Pt cancelled

## 2017-10-02 DIAGNOSIS — Z23 Encounter for immunization: Secondary | ICD-10-CM | POA: Diagnosis not present

## 2017-11-16 ENCOUNTER — Telehealth (INDEPENDENT_AMBULATORY_CARE_PROVIDER_SITE_OTHER): Payer: Self-pay | Admitting: Physical Medicine and Rehabilitation

## 2017-11-16 NOTE — Telephone Encounter (Signed)
Yes ok if helped, not good RFA candidate

## 2017-11-22 ENCOUNTER — Telehealth (INDEPENDENT_AMBULATORY_CARE_PROVIDER_SITE_OTHER): Payer: Self-pay | Admitting: Physical Medicine and Rehabilitation

## 2017-11-22 ENCOUNTER — Other Ambulatory Visit (INDEPENDENT_AMBULATORY_CARE_PROVIDER_SITE_OTHER): Payer: Self-pay | Admitting: Physical Medicine and Rehabilitation

## 2017-11-22 MED ORDER — DIAZEPAM 5 MG PO TABS: ORAL_TABLET | ORAL | 0 refills | Status: DC

## 2017-11-22 NOTE — Telephone Encounter (Signed)
printed

## 2017-11-22 NOTE — Telephone Encounter (Signed)
Left message for patient to call back and let me know which pharmacy she prefers.

## 2017-11-22 NOTE — Telephone Encounter (Signed)
Faxed to patient's pharmacy.  

## 2017-11-29 ENCOUNTER — Ambulatory Visit (INDEPENDENT_AMBULATORY_CARE_PROVIDER_SITE_OTHER): Payer: Medicare Other

## 2017-11-29 ENCOUNTER — Encounter (INDEPENDENT_AMBULATORY_CARE_PROVIDER_SITE_OTHER): Payer: Self-pay | Admitting: Physical Medicine and Rehabilitation

## 2017-11-29 ENCOUNTER — Ambulatory Visit (INDEPENDENT_AMBULATORY_CARE_PROVIDER_SITE_OTHER): Payer: Medicare Other | Admitting: Physical Medicine and Rehabilitation

## 2017-11-29 VITALS — BP 135/78 | HR 90 | Temp 98.3°F

## 2017-11-29 DIAGNOSIS — M47816 Spondylosis without myelopathy or radiculopathy, lumbar region: Secondary | ICD-10-CM

## 2017-11-29 DIAGNOSIS — G8929 Other chronic pain: Secondary | ICD-10-CM

## 2017-11-29 DIAGNOSIS — M545 Low back pain, unspecified: Secondary | ICD-10-CM

## 2017-11-29 MED ORDER — METHYLPREDNISOLONE ACETATE 80 MG/ML IJ SUSP
80.0000 mg | Freq: Once | INTRAMUSCULAR | Status: AC
  Administered 2017-11-29: 80 mg

## 2017-11-29 MED ORDER — LIDOCAINE HCL (PF) 1 % IJ SOLN
2.0000 mL | Freq: Once | INTRAMUSCULAR | Status: AC
  Administered 2017-11-29: 2 mL

## 2017-11-29 NOTE — Progress Notes (Deleted)
Pt stated only lower back pain. No redic.- BT, +Driver, -Dye Allergy.

## 2017-11-29 NOTE — Patient Instructions (Signed)

## 2017-11-30 NOTE — Procedures (Signed)
Cheryl [name] is a 71 year old female that we recently saw for reevaluation.  She had a prior left L5-S1 facet joint block completed in August which gave her quite a bit of relief.  She does have facet arthropathy of the lower spine.  She is obese with chronic low back pain and no leg pain.  We are going to repeat the injection today and hopefully she will do well.  He might be a candidate for radiofrequency ablation but she does have significant anxiety about injections the body habitus the ablation procedure occult.  We will see how she does with the facet joint block.  We may regroup with physical therapy.  She did have Valium preprocedure.  Lumbar Facet Joint Intra-Articular Injection(s) with Fluoroscopic Guidance  Patient: Cheryl [name]      Date of Birth: [birth year] MRN: [id number] PCP: Sharilyn Sites, MD      Visit Date: 11/29/2017   Universal Protocol:    Date/Time: 11/29/2017  Consent Given By: the patient  Position: PRONE   Additional Comments: Vital signs were monitored before and after the procedure. Patient was prepped and draped in the usual sterile fashion. The correct patient, procedure, and site was verified.   Injection Procedure Details:  Procedure Site One Meds Administered:  Meds ordered this encounter  Medications  . lidocaine (PF) (XYLOCAINE) 1 % injection 2 mL  . methylPREDNISolone acetate (DEPO-MEDROL) injection 80 mg     Laterality: Left  Location/Site:  L5-S1  Needle size: 22 guage  Needle type: Spinal  Needle Placement: Articular  Findings:  -Comments: Excellent flow of contrast producing a partial arthrogram.  Procedure Details: The fluoroscope beam is vertically oriented in AP, and the inferior recess is visualized beneath the lower pole of the inferior apophyseal process, which represents the target point for needle insertion. When direct visualization is difficult the target point is located at the medial projection of the vertebral pedicle.  The region overlying each aforementioned target is locally anesthetized with a 1 to 2 ml. volume of 1% Lidocaine without Epinephrine.   The spinal needle was inserted into each of the above mentioned facet joints using biplanar fluoroscopic guidance. A 0.25 to 0.5 ml. volume of Isovue-250 was injected and a partial facet joint arthrogram was obtained. A single spot film was obtained of the resulting arthrogram.    One to 1.25 ml of the steroid/anesthetic solution was then injected into each of the facet joints noted above.   Additional Comments:  The patient tolerated the procedure well Dressing: Band-Aid    Post-procedure details: Patient was observed during the procedure. Post-procedure instructions were reviewed.  Patient left the clinic in stable condition.  Pertinent Imaging: LUMBAR SPINE - COMPLETE 4+ VIEW  COMPARISON:  None.  FINDINGS: Degenerative facet disease in the lower lumbar spine. Slight anterolisthesis of L5 on S1. No fracture. Disc spaces are maintained. SI joints are symmetric and unremarkable.  IMPRESSION: Degenerative facet disease in the lower lumbar spine. No acute findings.   Electronically Signed   By: Rolm Baptise M.D.   On: 03/31/2016 21:39  MRI LUMBAR SPINE WITHOUT CONTRAST 01/10/2010  Technique: Multiplanar and multiecho pulse sequences of the lumbar spine were obtained without intravenous contrast.  Comparison: None  Findings: The lowest full intervertebral disk space is labeled L5- S1. If procedural intervention is to be performed, careful correlation with this numbering strategy is recommended.  The conus medullaris appears unremarkable. Conus level: L1-2.  T2 hyperintense, T1 hypointense renal lesions are statistically likely to represent  cysts but are not fully evaluated on today's lumbar spine MRI.  There is 3 mm of degenerative anterior subluxation of L5 on S1. No pars defects identified. Lumbar vertebral alignment is  otherwise unremarkable.  A small hemangioma is present in the L2 vertebral body.  Inversion recovery weighted images demonstrate no significant abnormal vertebral or periligamentous edema.  Additional findings at individual levels are as follows:  L2-3: Unremarkable.  L3-4: Unremarkable.  L4-5: Mild disc bulge. No significant stenosis.  L5-S1: Prominence the lumbar epidural adipose tissues noted. No impingement identified. Low T1 epidural signal posterior to the L5 vertebral body is thought to likely represent epidural venous plexus.  IMPRESSION:  1. No specific impingement identified. 2. Heterogeneous signal in the epidural space posterior to the L5 vertebral body is thought to represent venous plexus. I have reviewed this appearance with Dr. Barnet Glasgow, who concurs.

## 2017-12-05 DIAGNOSIS — Z1231 Encounter for screening mammogram for malignant neoplasm of breast: Secondary | ICD-10-CM | POA: Diagnosis not present

## 2017-12-05 DIAGNOSIS — Z124 Encounter for screening for malignant neoplasm of cervix: Secondary | ICD-10-CM | POA: Diagnosis not present

## 2017-12-05 DIAGNOSIS — Z01419 Encounter for gynecological examination (general) (routine) without abnormal findings: Secondary | ICD-10-CM | POA: Diagnosis not present

## 2018-01-16 ENCOUNTER — Ambulatory Visit: Payer: Medicare Other | Admitting: Nurse Practitioner

## 2018-01-18 ENCOUNTER — Ambulatory Visit (INDEPENDENT_AMBULATORY_CARE_PROVIDER_SITE_OTHER): Payer: Medicare Other | Admitting: Nurse Practitioner

## 2018-01-18 ENCOUNTER — Encounter: Payer: Self-pay | Admitting: Nurse Practitioner

## 2018-01-18 VITALS — BP 130/74 | HR 91 | Temp 97.0°F | Ht 59.0 in | Wt 237.0 lb

## 2018-01-18 DIAGNOSIS — K219 Gastro-esophageal reflux disease without esophagitis: Secondary | ICD-10-CM

## 2018-01-18 DIAGNOSIS — K5900 Constipation, unspecified: Secondary | ICD-10-CM

## 2018-01-18 NOTE — Assessment & Plan Note (Signed)
GERD symptoms well controlled on PPI.  Recommend continue current prescription medications.  Follow-up in 1 year.

## 2018-01-18 NOTE — Patient Instructions (Signed)
1. Continue your current medications. 2. As we discussed, you can use MiraLAX 1-2 times a day for breakthrough constipation. 3. Return for follow-up in 1 year. 4. Call us if you have any questions or concerns.

## 2018-01-18 NOTE — Progress Notes (Signed)
Referring Provider: Sharilyn Sites, MD Primary Care Physician:  Sharilyn Sites, MD Primary GI:  Dr. Gala Romney  Chief Complaint  Patient presents with  . Constipation    some better    HPI:   Cheryl [name] is a 72 y.o. female who presents for follow-up on constipation and GERD.  Patient was last seen in our office 07/15/2017 for the same as well as nausea.  Chronic history of hemorrhoids.  We see on Linzess 145 mcg which is increased to 290 mcg.  At her last visit she stated constipation was much improved with typically 2 bowel movements a day and feels like she empties out completely with each bowel movement.  Episodic worsening when she traveled to [place] but she returned to normal when she got home.  She did have some recurrent nausea recently to that time due to a sinus infection and headache.  No other GI symptoms.  GERD well controlled.  Recommended continue current medications, try MiraLAX 1-2 times a day for any breakthrough constipation this is especially if associated with travel, Zofran sent to her pharmacy for nausea, follow-up with primary care related to sinus issues.  Follow-up in 6 months.  Today she states she's doing ok overall. GERD still well controlled. She had a bad episode of constipation this past weekend. 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 Still taking Linzess 290 mcg which works well for her in general. Discussed previous recommendation to add MiraLAX 1-2 times a day for breakthrough symptoms. Denies abdominal pain, N/V, hematochezia, melena. Denies chest pain, dyspnea, dizziness, lightheadedness, syncope, near syncope. Denies any other upper or lower GI symptoms.  Past Medical History:  Diagnosis Date  . Acid reflux   . Arthritis   . Neuromuscular disorder ([place])   . Sinus drainage   . Sleep apnea    had surgery to correct    Past Surgical History:  Procedure Laterality Date  . ANTERIOR CERVICAL DECOMP/DISCECTOMY  FUSION N/A 11/19/2016   Procedure: ANTERIOR CERVICAL DISCECTOMY AND FUSION C5-6 WITH PLATES, SCREWS, CAGE, VIVIGEN II;  Surgeon: Jessy Oto, MD;  Location: [place];  Service: Orthopedics;  Laterality: N/A;  . CARPAL TUNNEL RELEASE    . CATARACT EXTRACTION, BILATERAL Bilateral 2017   One in December 2017 and one in January 2018  . COLONOSCOPY  02/15/2005   SWN:IOEVO small polyps ablated via cold biopsy, one from transverse colon and two from the rectum/Small external hemorrhoids  . COLONOSCOPY  07/10/2010   JJK:KXFGHW rectum/long redundant colon, polyps in the sigmoid, descending, hepatic flexure/ADENOMATOUS POLYPS. next TCS due 06/2015  . COLONOSCOPY  1995   3 polyps, path revealed chronic colitis  . COLONOSCOPY N/A 08/05/2015   Procedure: COLONOSCOPY;  Surgeon: Daneil Dolin, MD;  [facility];  Service: Endoscopy;  Laterality: N/A;  1000  . ESOPHAGOGASTRODUODENOSCOPY  1995   Gastritis  . ESOPHAGOGASTRODUODENOSCOPY N/A 04/16/2013   RMR: HH  . ESOPHAGOGASTRODUODENOSCOPY N/A 05/13/2016   Procedure: ESOPHAGOGASTRODUODENOSCOPY (EGD);  Surgeon: Daneil Dolin, MD;  [facility];  Service: Endoscopy;  Laterality: N/A;  145  . ESOPHAGOGASTRODUODENOSCOPY N/A 08/18/2016   Procedure: ESOPHAGOGASTRODUODENOSCOPY (EGD);  Surgeon: Daneil Dolin, MD;  [facility];  Service: Endoscopy;  Laterality: N/A;  7:45 AM  . KNEE SURGERY     x2  . ROTATOR CUFF REPAIR     x2  . SHOULDER ARTHROSCOPY WITH ROTATOR CUFF REPAIR AND SUBACROMIAL DECOMPRESSION  Right 01/21/2015   Procedure: RIGHT SHOULDER ARTHROSCOPIC DEBRIDEMNT OF G-H JOINT AND REMOVAL OF LOOSE BODIES,ARTHROSCOPIC SUBACROMIAL DECOMPRESSION,MINI OPEN RCT REPAIR WITH SUPPLEMENTAL [facility] PATCH;  Surgeon: Garald Balding, MD;  Location: [place];  Service: Orthopedics;  Laterality: Right;  . TONSILLECTOMY    . TRIGGER FINGER RELEASE    . UVULOPALATOPHARYNGOPLASTY      Current Outpatient Medications  Medication Sig Dispense  Refill  . acetaminophen (TYLENOL) 500 MG tablet Take 500-1,000 mg by mouth every 6 (six) hours as needed (for pain/headache).    Marland Kitchen albuterol (PROVENTIL HFA;VENTOLIN HFA) 108 (90 BASE) MCG/ACT inhaler Inhale 1 puff into the lungs every 6 (six) hours as needed for wheezing or shortness of breath.    . allopurinol (ZYLOPRIM) 300 MG tablet Take 300 mg by mouth at bedtime.    . Biotin 5000 MCG TABS Take 5,000 mcg by mouth daily.    Marland Kitchen CALCIUM-VITAMIN D PO Take 1 tablet by mouth every morning.    . diclofenac sodium (VOLTAREN) 1 % GEL Apply 2-4 g topically 4 (four) times daily. 5 Tube 3  . febuxostat (ULORIC) 40 MG tablet Take 40 mg by mouth at bedtime.     . finasteride (PROSCAR) 5 MG tablet Take 1 mg by mouth at bedtime.    . fluticasone (FLONASE) 50 MCG/ACT nasal spray Place 1 spray into both nostrils daily as needed for allergies (sinuses).    . gabapentin (NEURONTIN) 100 MG capsule Take 100 mg by mouth at bedtime as needed (for pain).    Marland Kitchen LINZESS 290 MCG CAPS capsule TAKE 1 CAPSULE (290 MCG TOTAL) BY MOUTH DAILY BEFORE BREAKFAST. 90 capsule 3  . methocarbamol (ROBAXIN) 500 MG tablet Take 1 tablet (500 mg total) by mouth every 8 (eight) hours as needed for muscle spasms. 40 tablet 1  . montelukast (SINGULAIR) 10 MG tablet Take 10 mg by mouth every morning.    . ondansetron (ZOFRAN) 4 MG tablet Take 1 tablet (4 mg total) by mouth every 8 (eight) hours as needed for nausea or vomiting. 30 tablet 0  . pantoprazole (PROTONIX) 40 MG tablet Take 1 tablet (40 mg total) by mouth daily. 30 tablet 11  . raloxifene (EVISTA) 60 MG tablet Take 60 mg by mouth daily.    Marland Kitchen tolterodine (DETROL) 1 MG tablet Take 1 mg by mouth 2 (two) times daily.    . vitamin B-12 (CYANOCOBALAMIN) 1000 MCG tablet Take 1,000 mcg by mouth daily.     No current facility-administered medications for this visit.     Allergies as of 01/18/2018 - Review Complete 01/18/2018  Allergen Reaction Noted  . Prednisone Other (See Comments)  04/04/2013  . Tramadol Nausea Only 01/21/2015  . Aspirin Nausea And Vomiting   . Celecoxib Rash     Family History  Problem Relation Age of Onset  . Cancer Mother   . Cancer Father   . Diabetes Sister   . Colon cancer Neg Hx   . Liver disease Neg Hx     Social History   Socioeconomic History  . Marital status: Married    Spouse name: None  . Number of children: 2  . Years of education: None  . Highest education level: None  Social Needs  . Financial resource strain: None  . Food insecurity - worry: None  . Food insecurity - inability: None  . Transportation needs - medical: None  . Transportation needs - non-medical: None  Occupational History  . Occupation: BUS DRIVER    Employer:  [employer]  Tobacco Use  . Smoking status: Former Smoker    Packs/day: 0.10    Years: 48.00    Pack years: 4.80    Types: Cigarettes    Last attempt to quit: 05/11/2005    Years since quitting: 12.6  . Smokeless tobacco: Never Used  Substance and Sexual Activity  . Alcohol use: No    Alcohol/week: 0.0 oz  . Drug use: No  . Sexual activity: None  Other Topics Concern  . None  Social History Narrative  . None    Review of Systems: General: Negative for anorexia, weight loss, fever, chills, fatigue, weakness. ENT: Negative for hoarseness, difficulty swallowing , nasal congestion. CV: Negative for chest pain, angina, palpitations, dyspnea on exertion, peripheral edema.  Respiratory: Negative for dyspnea at rest, dyspnea on exertion, cough, sputum, wheezing.  GI: See history of present illness.  Endo: Negative for unusual weight change.  Heme: Negative for bruising or bleeding.   Physical Exam: BP 130/74   Pulse 91   Temp (!) 97 F (36.1 C) (Oral)   Ht 4\' 11"  (1.499 m)   Wt 237 lb (107.5 kg)   BMI 47.87 kg/m  General:   Obese female. Alert and oriented. Pleasant and cooperative. Well-nourished and well-developed.  Eyes:  Without icterus, sclera clear and  conjunctiva pink.  Ears:  Normal auditory acuity. Cardiovascular:  S1, S2 present without murmurs appreciated. Normal pulses noted. Extremities without clubbing or edema. Respiratory:  Clear to auscultation bilaterally. No wheezes, rales, or rhonchi. No distress.  Gastrointestinal:  +BS, rounded but soft, non-tender and non-distended. No HSM noted. No guarding or rebound. No masses appreciated.  Rectal:  Deferred  Musculoskalatal:  Symmetrical without gross deformities. Neurologic:  Alert and oriented x4;  grossly normal neurologically. Psych:  Alert and cooperative. Normal mood and affect. Heme/Lymph/Immune: No excessive bruising noted.    01/18/2018 3:56 PM   Disclaimer: This note was dictated with voice recognition software. Similar sounding words can inadvertently be transcribed and may not be corrected upon review.

## 2018-01-18 NOTE — Assessment & Plan Note (Signed)
Constipation much better controlled on Linzess 290 mcg daily.  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  She has had one episode of breakthrough constipation in the past 6 months which was coincidentally this past weekend.  I discussed previous recommendations to use MiraLAX 1-2 times a day as needed for breakthrough symptoms.  She states she will try this.  Overall, she is satisfied with her result.  Return for follow-up in 1 year

## 2018-01-19 NOTE — Progress Notes (Signed)
cc'ed to pcp °

## 2018-02-08 DIAGNOSIS — H40013 Open angle with borderline findings, low risk, bilateral: Secondary | ICD-10-CM | POA: Diagnosis not present

## 2018-02-08 DIAGNOSIS — Z961 Presence of intraocular lens: Secondary | ICD-10-CM | POA: Diagnosis not present

## 2018-02-17 ENCOUNTER — Ambulatory Visit: Payer: Medicare Other | Admitting: Internal Medicine

## 2018-03-06 ENCOUNTER — Ambulatory Visit: Payer: Medicare Other | Admitting: Internal Medicine

## 2018-04-14 DIAGNOSIS — K5909 Other constipation: Secondary | ICD-10-CM | POA: Diagnosis not present

## 2018-04-14 DIAGNOSIS — Z0001 Encounter for general adult medical examination with abnormal findings: Secondary | ICD-10-CM | POA: Diagnosis not present

## 2018-04-14 DIAGNOSIS — R32 Unspecified urinary incontinence: Secondary | ICD-10-CM | POA: Diagnosis not present

## 2018-04-14 DIAGNOSIS — R7309 Other abnormal glucose: Secondary | ICD-10-CM | POA: Diagnosis not present

## 2018-04-14 DIAGNOSIS — Z6841 Body Mass Index (BMI) 40.0 and over, adult: Secondary | ICD-10-CM | POA: Diagnosis not present

## 2018-04-14 DIAGNOSIS — Z1389 Encounter for screening for other disorder: Secondary | ICD-10-CM | POA: Diagnosis not present

## 2018-04-14 DIAGNOSIS — K219 Gastro-esophageal reflux disease without esophagitis: Secondary | ICD-10-CM | POA: Diagnosis not present

## 2018-05-04 ENCOUNTER — Ambulatory Visit: Payer: Medicare Other | Admitting: Internal Medicine

## 2018-06-09 ENCOUNTER — Encounter: Payer: Self-pay | Admitting: Internal Medicine

## 2018-06-09 ENCOUNTER — Ambulatory Visit (INDEPENDENT_AMBULATORY_CARE_PROVIDER_SITE_OTHER): Payer: Medicare Other | Admitting: Internal Medicine

## 2018-06-09 VITALS — BP 130/90 | HR 76 | Ht 59.0 in | Wt 230.0 lb

## 2018-06-09 DIAGNOSIS — E063 Autoimmune thyroiditis: Secondary | ICD-10-CM

## 2018-06-09 LAB — T4, FREE: Free T4: 0.85 ng/dL (ref 0.60–1.60)

## 2018-06-09 LAB — TSH: TSH: 2.09 u[IU]/mL (ref 0.35–4.50)

## 2018-06-09 LAB — T3, FREE: T3, Free: 3.9 pg/mL (ref 2.3–4.2)

## 2018-06-09 NOTE — Progress Notes (Signed)
Patient ID: Cheryl [name], female   DOB: 1946-12-11, 72 y.o.   MRN: [id number]   HPI  Cheryl [name] is a 72 y.o.-year-old female, initially referred by her PCP,  Dr. Micheline Rough, returning for f/u for euthyroid Hashimoto's thyroiditis. Last visit 1 year and 3 months ago.  She has been diagnosed with euthyroid Hashimoto's thyroiditis in 10/2015.  We did not have to start levothyroxine yet.  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 No FH of thyroid cancer. No h/o radiation tx to head or neck.  No seaweed or kelp. No recent contrast studies. No herbal supplements. + Biotin use - last dose 5 days ago. No recent steroids use.   She also has a history of OSA >> had surgery for it, osteopenia  (on Raloxifene), gout.  ROS: Constitutional: no wt gain/loss Eyes: no blurry vision, no xerophthalmia ENT: no sore throat, + see HPI Cardiovascular: no CP/no SOB/no palpitations/no leg swelling Respiratory: no cough/no SOB/no wheezing Gastrointestinal: no N/no V/no D/+ C/no acid reflux Musculoskeletal: + muscle aches/+ joint aches Skin: + rashes - chest, b/w breasts - chronic, + hair loss Neurological: no tremors/no numbness/no  tingling/no dizziness  I reviewed pt's medications, allergies, PMH, social hx, family hx, and changes were documented in the history of present illness. Otherwise, unchanged from my initial visit note.  Past Medical History:  Diagnosis Date  . Acid reflux   . Arthritis   . Neuromuscular disorder ([place])   . Sinus drainage   . Sleep apnea    had surgery to correct   Past Surgical History:  Procedure Laterality Date  . ANTERIOR CERVICAL DECOMP/DISCECTOMY FUSION N/A 11/19/2016   Procedure: ANTERIOR CERVICAL DISCECTOMY AND FUSION C5-6 WITH PLATES, SCREWS, CAGE, VIVIGEN II;  Surgeon: Jessy Oto, MD;  Location: [place];  Service: Orthopedics;  Laterality: N/A;  . CARPAL TUNNEL RELEASE    . CATARACT EXTRACTION, BILATERAL Bilateral 2017   One in December 2017 and one in January 2018  . COLONOSCOPY  02/15/2005   QBH:ALPFX small polyps ablated via cold biopsy, one from transverse colon and two from the rectum/Small external hemorrhoids  . COLONOSCOPY  07/10/2010   TKW:IOXBDZ rectum/long redundant colon, polyps in the sigmoid, descending, hepatic flexure/ADENOMATOUS POLYPS. next TCS due 06/2015  . COLONOSCOPY  1995   3 polyps, path revealed chronic colitis  . COLONOSCOPY N/A 08/05/2015   Procedure: COLONOSCOPY;  Surgeon: Daneil Dolin, MD;  [facility];  Service: Endoscopy;  Laterality: N/A;  1000  . ESOPHAGOGASTRODUODENOSCOPY  1995   Gastritis  . ESOPHAGOGASTRODUODENOSCOPY N/A 04/16/2013   RMR: HH  . ESOPHAGOGASTRODUODENOSCOPY N/A 05/13/2016   Procedure: ESOPHAGOGASTRODUODENOSCOPY (EGD);  Surgeon: Daneil Dolin, MD;  [facility];  Service: Endoscopy;  Laterality: N/A;  145  . ESOPHAGOGASTRODUODENOSCOPY N/A 08/18/2016   Procedure: ESOPHAGOGASTRODUODENOSCOPY (EGD);  Surgeon: Daneil Dolin, MD;  [facility];  Service: Endoscopy;  Laterality: N/A;  7:45 AM  . KNEE SURGERY     x2  . ROTATOR CUFF REPAIR     x2  . SHOULDER ARTHROSCOPY WITH ROTATOR CUFF REPAIR AND  SUBACROMIAL DECOMPRESSION Right 01/21/2015   Procedure: RIGHT SHOULDER ARTHROSCOPIC DEBRIDEMNT OF G-H JOINT AND REMOVAL OF LOOSE BODIES,ARTHROSCOPIC SUBACROMIAL DECOMPRESSION,MINI OPEN RCT REPAIR WITH SUPPLEMENTAL [facility] PATCH;  Surgeon: Garald Balding, MD;  Location: [place];  Service: Orthopedics;  Laterality: Right;  . TONSILLECTOMY    . TRIGGER FINGER RELEASE    . UVULOPALATOPHARYNGOPLASTY     Social History   Social History  . Marital Status: Married    Spouse Name: N/A  . Number of Children: 2   Occupational History  . BUS DRIVER - retired    Social History Main Topics  . Smoking status: Former Smoker -- 48 years    Types: Cigarettes  . Smokeless tobacco: Former Systems developer    Quit date: 12/20/2004  . Alcohol Use: No  . Drug Use: No   Current Outpatient Medications on File Prior to Visit  Medication Sig Dispense Refill  . acetaminophen (TYLENOL) 500 MG tablet Take 500-1,000 mg by mouth every 6 (six) hours as needed (for pain/headache).    Marland Kitchen albuterol (PROVENTIL HFA;VENTOLIN HFA) 108 (90 BASE) MCG/ACT inhaler Inhale 1 puff into the lungs every 6 (six) hours as needed for wheezing or shortness of breath.    . allopurinol (ZYLOPRIM) 300 MG tablet Take 300 mg by mouth at bedtime.    . Biotin 5000 MCG TABS Take 5,000 mcg by mouth daily.    Marland Kitchen CALCIUM-VITAMIN D PO Take 1 tablet by mouth every morning.    . diclofenac sodium (VOLTAREN) 1 % GEL Apply 2-4 g topically 4 (four) times daily. 5 Tube 3  . febuxostat (ULORIC) 40 MG tablet Take 40 mg by mouth at bedtime.     . finasteride (PROSCAR) 5 MG tablet Take 1 mg by mouth at bedtime.    . fluticasone (FLONASE) 50 MCG/ACT nasal spray Place 1 spray into both nostrils daily as needed for allergies (sinuses).    . gabapentin (NEURONTIN) 100 MG capsule Take 100 mg by mouth at bedtime as needed (for pain).    Marland Kitchen LINZESS 290 MCG CAPS capsule TAKE 1 CAPSULE (290 MCG TOTAL) BY MOUTH DAILY BEFORE BREAKFAST. 90 capsule 3  . methocarbamol (ROBAXIN)  500 MG tablet Take 1 tablet (500 mg total) by mouth every 8 (eight) hours as needed for muscle spasms. 40 tablet 1  . montelukast (SINGULAIR) 10 MG tablet Take 10 mg by mouth every morning.    . ondansetron (ZOFRAN) 4 MG tablet Take 1 tablet (4 mg total) by mouth every 8 (eight) hours as needed for nausea or vomiting. 30 tablet 0  . pantoprazole (PROTONIX) 40 MG tablet Take 1 tablet (40 mg total) by mouth daily. 30 tablet 11  . raloxifene (EVISTA) 60 MG tablet Take 60 mg by mouth daily.    Marland Kitchen tolterodine (DETROL) 1 MG tablet Take 1 mg by mouth 2 (two) times daily.    . vitamin B-12 (CYANOCOBALAMIN) 1000 MCG tablet Take 1,000 mcg by mouth daily.     No current facility-administered medications on file prior to visit.    Allergies  Allergen  Reactions  . Prednisone Other (See Comments)    Hallucinations   . Tramadol Nausea Only  . Aspirin Nausea And Vomiting  . Celecoxib Rash   Family History  Problem Relation Age of Onset  . Cancer Mother   . Cancer Father   . Diabetes Sister   . Colon cancer Neg Hx   . Liver disease Neg Hx    PE: BP 130/90   Pulse 76   Ht 4\' 11"  (1.499 m)   Wt 230 lb (104.3 kg)   SpO2 95%   BMI 46.45 kg/m  Wt Readings from Last 3 Encounters:  06/09/18 230 lb (104.3 kg)  01/18/18 237 lb (107.5 kg)  08/02/17 260 lb (117.9 kg)   Constitutional: Obese, in NAD Eyes: PERRLA, EOMI, no exophthalmos ENT: moist mucous membranes, no thyromegaly, no cervical lymphadenopathy Cardiovascular: RRR, No MRG Respiratory: CTA B Gastrointestinal: abdomen soft, NT, ND, BS+ Musculoskeletal: no deformities, strength intact in all 4 Skin: moist, warm, no rashes Neurological: no tremor with outstretched hands, DTR normal in all 4  ASSESSMENT: 1. Hashimoto thyroiditis  PLAN: 1. Hashimoto thyroiditis -Patient has a history of euthyroid Hashimoto's thyroiditis, for which she did not require levothyroxine treatment.   -Reviewed latest TFTs, which were normal, in 02/2017. -At  last visit, I suggested selenium supplements to help with decreasing her TPO antibodies.  I explained that not all patients may respond to this treatment.  She did not start this. -At this visit, upon questioning, she continues to experience fatigue, but not more than normal.  Also, she continues to have hair loss and hot flashes.  Reviewing her weight, she lost approximately 30 pounds since last visit. -She is usually on biotin, and we discussed that she needs to be off for at least 5 days since the biotin can interact with the thyroid test assay.  She stopped the biotin 5 days ago. -We discussed about possible hypothyroid symptoms and I advised her to let me know if she starts to experience them.  We also discussed about possible neck compression symptoms, which she denies. -At this visit, we will repeat her TFTs and will also add TPO antibodies to check the degree of activity of her Hashimoto's thyroiditis -We discussed about how to take the levothyroxine correctly, in case we need to start: Take the thyroid hormone every day, with water, at least 30 minutes before breakfast, separated by at least 4 hours from: - acid reflux medications - calcium - iron - multivitamins  CC: Dr Ronita Hipps  - time spent with the patient: 15 minutes, of which >50% was spent in obtaining information about her symptoms, reviewing her previous labs, evaluations, and treatments, counseling her about her condition (please see the discussed topics above), and developing a plan to further investigate and treat it.  Component     Latest Ref Rng & Units 06/09/2018  T4,Free(Direct)     0.60 - 1.60 ng/dL 0.85  Triiodothyronine,Free,Serum     2.3 - 4.2 pg/mL 3.9  TSH     0.35 - 4.50 uIU/mL 2.09  Thyroperoxidase Ab SerPl-aCnc     <9 IU/mL 109 (H)   TPO antibodies have improved, but they are still high.  Her thyroid tests are excellent.  No need to start levothyroxine.  Philemon Kingdom, MD PhD [facility] Endocrinology

## 2018-06-09 NOTE — Patient Instructions (Signed)
Please stop Biotin for 5 days before our next appt.  Please return in 1 year.  

## 2018-06-12 LAB — THYROID PEROXIDASE ANTIBODY: Thyroperoxidase Ab SerPl-aCnc: 109 IU/mL — ABNORMAL HIGH (ref <9)

## 2018-07-21 DIAGNOSIS — R7309 Other abnormal glucose: Secondary | ICD-10-CM | POA: Diagnosis not present

## 2018-07-21 DIAGNOSIS — Z6841 Body Mass Index (BMI) 40.0 and over, adult: Secondary | ICD-10-CM | POA: Diagnosis not present

## 2018-07-21 DIAGNOSIS — S37001A Unspecified injury of right kidney, initial encounter: Secondary | ICD-10-CM | POA: Diagnosis not present

## 2018-07-21 DIAGNOSIS — E782 Mixed hyperlipidemia: Secondary | ICD-10-CM | POA: Diagnosis not present

## 2018-07-21 DIAGNOSIS — R740 Nonspecific elevation of levels of transaminase and lactic acid dehydrogenase [LDH]: Secondary | ICD-10-CM | POA: Diagnosis not present

## 2018-07-21 DIAGNOSIS — Z1389 Encounter for screening for other disorder: Secondary | ICD-10-CM | POA: Diagnosis not present

## 2018-07-21 DIAGNOSIS — R42 Dizziness and giddiness: Secondary | ICD-10-CM | POA: Diagnosis not present

## 2018-09-22 ENCOUNTER — Telehealth (INDEPENDENT_AMBULATORY_CARE_PROVIDER_SITE_OTHER): Payer: Self-pay | Admitting: Radiology

## 2018-09-22 NOTE — Telephone Encounter (Signed)
Patient left message requesting appointment for another injection by Dr. Ernestina Patches, states she is hurting again. Patient requests to be seen 09/26/18 or 09/29/18  Last office vist, 11/29/17 with Dr. Ernestina Patches left L5-S1 facet inject.  Ok to repeat or office visit?  Call back # [phone]

## 2018-09-22 NOTE — Telephone Encounter (Signed)
Should be ok to repeat if same pain and injection helped a lot, anything else then OV

## 2018-09-25 NOTE — Telephone Encounter (Signed)
Pt does not need prior authorization. Reference [id number]

## 2018-09-25 NOTE — Telephone Encounter (Signed)
Called pt and she states she have to call back to schedule appt.

## 2018-09-25 NOTE — Telephone Encounter (Signed)
See below

## 2018-09-25 NOTE — Telephone Encounter (Signed)
Pt is scheduled with driver 83/09/40

## 2018-10-02 DIAGNOSIS — Z23 Encounter for immunization: Secondary | ICD-10-CM | POA: Diagnosis not present

## 2018-10-05 ENCOUNTER — Telehealth (INDEPENDENT_AMBULATORY_CARE_PROVIDER_SITE_OTHER): Payer: Self-pay | Admitting: *Deleted

## 2018-10-06 ENCOUNTER — Other Ambulatory Visit (INDEPENDENT_AMBULATORY_CARE_PROVIDER_SITE_OTHER): Payer: Self-pay | Admitting: Physical Medicine and Rehabilitation

## 2018-10-06 DIAGNOSIS — F411 Generalized anxiety disorder: Secondary | ICD-10-CM

## 2018-10-06 MED ORDER — DIAZEPAM 5 MG PO TABS: ORAL_TABLET | ORAL | 0 refills | Status: DC

## 2018-10-06 NOTE — Progress Notes (Signed)
Pre-procedure diazepam ordered for pre-operative anxiety.  

## 2018-10-10 ENCOUNTER — Ambulatory Visit (INDEPENDENT_AMBULATORY_CARE_PROVIDER_SITE_OTHER): Payer: Self-pay

## 2018-10-10 ENCOUNTER — Ambulatory Visit (INDEPENDENT_AMBULATORY_CARE_PROVIDER_SITE_OTHER): Payer: Medicare Other | Admitting: Physical Medicine and Rehabilitation

## 2018-10-10 ENCOUNTER — Encounter

## 2018-10-10 ENCOUNTER — Encounter (INDEPENDENT_AMBULATORY_CARE_PROVIDER_SITE_OTHER): Payer: Self-pay | Admitting: Physical Medicine and Rehabilitation

## 2018-10-10 VITALS — BP 143/86 | HR 83 | Temp 98.5°F

## 2018-10-10 DIAGNOSIS — M47816 Spondylosis without myelopathy or radiculopathy, lumbar region: Secondary | ICD-10-CM

## 2018-10-10 MED ORDER — METHYLPREDNISOLONE ACETATE 80 MG/ML IJ SUSP
80.0000 mg | Freq: Once | INTRAMUSCULAR | Status: AC
  Administered 2018-10-10: 80 mg

## 2018-10-10 NOTE — Patient Instructions (Signed)

## 2018-10-10 NOTE — Progress Notes (Signed)
 .  Numeric Pain Rating Scale and Functional Assessment Average Pain 10   In the last MONTH (on 0-10 scale) has pain interfered with the following?  1. General activity like being  able to carry out your everyday physical activities such as walking, climbing stairs, carrying groceries, or moving a chair?  Rating(6)   +Driver, -BT, -Dye Allergies.  

## 2018-10-18 NOTE — Progress Notes (Signed)
TYASIA PACKARD - 72 y.o. female MRN [id number]  Date of birth: [birth year]  Office Visit Note: Visit Date: 10/10/2018 PCP: Sharilyn Sites, MD Referred by: Sharilyn Sites, MD  Subjective: Chief Complaint  Patient presents with  . Lower Back - Pain   HPI:  PAULINA MUCHMORE is a 72 y.o. female who comes in today For reevaluation and possible repeat of left L5-S1 intra-articular facet joint block.  Her history is such that she has been followed by multiple orthopedic surgeons in the past and I have seen her off and on over the years.  She is also seen Dr. Louanne Skye in office more for her cervical spine.  She has had a prior ACDF a couple years ago.  She continues to follow with him for that but not really her lower back.  We have seen her on 2 occasions over the last year one in August and then one in December 2018.  We completed left L5-S1 intra-articular injection with good relief of her left-sided low back pain.  She comes in today rating this is a 10 out of 10 in the same area radiates into the buttocks.  She reports really started about 3 or 4 weeks ago seriously but even before that it was starting to increase.  She says it does limit her ability to stand and do activities she would like to do.  No new injury.  Really any movement and standing makes it worse.  MRI was from 2011.  More recent x-rays do show facet arthropathy at L5-S1 with small listhesis.  I think given the fact that she is gotten such good relief with the intra-articular injection we should repeat that.  We will repeat it today given her pain symptoms.  Depending on relief would look at repeating MRI of the lumbar spine with a goal of potentially radiofrequency ablation but she is doing well with just the intra-articular injections.  Somewhere in the interim she has seen Dr. Neomia Dear.  She was unable to continue to see her to insurance difficulties.  ROS Otherwise per HPI.  Assessment & Plan: Visit Diagnoses:  1. Spondylosis  without myelopathy or radiculopathy, lumbar region     Plan: No additional findings.   Meds & Orders:  Meds ordered this encounter  Medications  . methylPREDNISolone acetate (DEPO-MEDROL) injection 80 mg    Orders Placed This Encounter  Procedures  . Facet Injection  . XR C-ARM NO REPORT    Follow-up: Return if symptoms worsen or fail to improve.   Procedures: No procedures performed  Lumbar Facet Joint Intra-Articular Injection(s) with Fluoroscopic Guidance  Patient: DANI WALLNER      Date of Birth: [birth year] MRN: [id number] PCP: Sharilyn Sites, MD      Visit Date: 10/10/2018   Universal Protocol:    Date/Time: 10/10/2018  Consent Given By: the patient  Position: PRONE   Additional Comments: Vital signs were monitored before and after the procedure. Patient was prepped and draped in the usual sterile fashion. The correct patient, procedure, and site was verified.   Injection Procedure Details:  Procedure Site One Meds Administered:  Meds ordered this encounter  Medications  . methylPREDNISolone acetate (DEPO-MEDROL) injection 80 mg     Laterality: Left  Location/Site:  L5-S1  Needle size: 22 guage  Needle type: Spinal  Needle Placement: Articular  Findings:  -Comments: Excellent flow of contrast producing a partial arthrogram.  Procedure Details: The fluoroscope beam is vertically oriented in AP, and the inferior  recess is visualized beneath the lower pole of the inferior apophyseal process, which represents the target point for needle insertion. When direct visualization is difficult the target point is located at the medial projection of the vertebral pedicle. The region overlying each aforementioned target is locally anesthetized with a 1 to 2 ml. volume of 1% Lidocaine without Epinephrine.   The spinal needle was inserted into each of the above mentioned facet joints using biplanar fluoroscopic guidance. A 0.25 to 0.5 ml. volume of Isovue-250 was  injected and a partial facet joint arthrogram was obtained. A single spot film was obtained of the resulting arthrogram.    One to 1.25 ml of the steroid/anesthetic solution was then injected into each of the facet joints noted above.   Additional Comments:  The patient tolerated the procedure well Dressing: Band-Aid    Post-procedure details: Patient was observed during the procedure. Post-procedure instructions were reviewed.  Patient left the clinic in stable condition.    Clinical History: LUMBAR SPINE - COMPLETE 4+ VIEW  COMPARISON:  None.  FINDINGS: Degenerative facet disease in the lower lumbar spine. Slight anterolisthesis of L5 on S1. No fracture. Disc spaces are maintained. SI joints are symmetric and unremarkable.  IMPRESSION: Degenerative facet disease in the lower lumbar spine. No acute findings.   Electronically Signed   By: Rolm Baptise M.D.   On: 03/31/2016 21:39  MRI LUMBAR SPINE WITHOUT CONTRAST 01/10/2010  Technique: Multiplanar and multiecho pulse sequences of the lumbar spine were obtained without intravenous contrast.  Comparison: None  Findings: The lowest full intervertebral disk space is labeled L5- S1. If procedural intervention is to be performed, careful correlation with this numbering strategy is recommended.  The conus medullaris appears unremarkable. Conus level: L1-2.  T2 hyperintense, T1 hypointense renal lesions are statistically likely to represent cysts but are not fully evaluated on today's lumbar spine MRI.  There is 3 mm of degenerative anterior subluxation of L5 on S1. No pars defects identified. Lumbar vertebral alignment is otherwise unremarkable.  A small hemangioma is present in the L2 vertebral body.  Inversion recovery weighted images demonstrate no significant abnormal vertebral or periligamentous edema.  Additional findings at individual levels are as follows:  L2-3: Unremarkable.  L3-4:  Unremarkable.  L4-5: Mild disc bulge. No significant stenosis.  L5-S1: Prominence the lumbar epidural adipose tissues noted. No impingement identified. Low T1 epidural signal posterior to the L5 vertebral body is thought to likely represent epidural venous plexus.  IMPRESSION:  1. No specific impingement identified. 2. Heterogeneous signal in the epidural space posterior to the L5 vertebral body is thought to represent venous plexus. I have reviewed this appearance with Dr. Barnet Glasgow, who concurs.     Objective:  VS:  HT:    WT:   BMI:     BP:(!) 143/86  HR:83bpm  TEMP:98.5 F (36.9 C)(Oral)  RESP:  Physical Exam  Ortho Exam Imaging: No results found.

## 2018-10-18 NOTE — Procedures (Signed)
Lumbar Facet Joint Intra-Articular Injection(s) with Fluoroscopic Guidance  Patient: Cheryl [name]      Date of Birth: 1946-07-27 MRN: [id number] PCP: Sharilyn Sites, MD      Visit Date: 10/10/2018   Universal Protocol:    Date/Time: 10/10/2018  Consent Given By: the patient  Position: PRONE   Additional Comments: Vital signs were monitored before and after the procedure. Patient was prepped and draped in the usual sterile fashion. The correct patient, procedure, and site was verified.   Injection Procedure Details:  Procedure Site One Meds Administered:  Meds ordered this encounter  Medications  . methylPREDNISolone acetate (DEPO-MEDROL) injection 80 mg     Laterality: Left  Location/Site:  L5-S1  Needle size: 22 guage  Needle type: Spinal  Needle Placement: Articular  Findings:  -Comments: Excellent flow of contrast producing a partial arthrogram.  Procedure Details: The fluoroscope beam is vertically oriented in AP, and the inferior recess is visualized beneath the lower pole of the inferior apophyseal process, which represents the target point for needle insertion. When direct visualization is difficult the target point is located at the medial projection of the vertebral pedicle. The region overlying each aforementioned target is locally anesthetized with a 1 to 2 ml. volume of 1% Lidocaine without Epinephrine.   The spinal needle was inserted into each of the above mentioned facet joints using biplanar fluoroscopic guidance. A 0.25 to 0.5 ml. volume of Isovue-250 was injected and a partial facet joint arthrogram was obtained. A single spot film was obtained of the resulting arthrogram.    One to 1.25 ml of the steroid/anesthetic solution was then injected into each of the facet joints noted above.   Additional Comments:  The patient tolerated the procedure well Dressing: Band-Aid    Post-procedure details: Patient was observed during the  procedure. Post-procedure instructions were reviewed.  Patient left the clinic in stable condition.

## 2018-10-23 DIAGNOSIS — M810 Age-related osteoporosis without current pathological fracture: Secondary | ICD-10-CM | POA: Diagnosis not present

## 2018-12-07 ENCOUNTER — Encounter: Payer: Self-pay | Admitting: Internal Medicine

## 2018-12-18 ENCOUNTER — Telehealth (INDEPENDENT_AMBULATORY_CARE_PROVIDER_SITE_OTHER): Payer: Self-pay | Admitting: Orthopedic Surgery

## 2018-12-18 ENCOUNTER — Telehealth (INDEPENDENT_AMBULATORY_CARE_PROVIDER_SITE_OTHER): Payer: Self-pay | Admitting: Physical Medicine and Rehabilitation

## 2018-12-18 DIAGNOSIS — Z124 Encounter for screening for malignant neoplasm of cervix: Secondary | ICD-10-CM | POA: Diagnosis not present

## 2018-12-18 DIAGNOSIS — M545 Low back pain, unspecified: Secondary | ICD-10-CM

## 2018-12-18 DIAGNOSIS — Z1231 Encounter for screening mammogram for malignant neoplasm of breast: Secondary | ICD-10-CM | POA: Diagnosis not present

## 2018-12-18 DIAGNOSIS — Z01419 Encounter for gynecological examination (general) (routine) without abnormal findings: Secondary | ICD-10-CM | POA: Diagnosis not present

## 2018-12-18 DIAGNOSIS — G8929 Other chronic pain: Secondary | ICD-10-CM

## 2018-12-18 DIAGNOSIS — Z779 Other contact with and (suspected) exposures hazardous to health: Secondary | ICD-10-CM | POA: Diagnosis not present

## 2018-12-18 DIAGNOSIS — M47816 Spondylosis without myelopathy or radiculopathy, lumbar region: Secondary | ICD-10-CM

## 2018-12-18 NOTE — Telephone Encounter (Signed)
If helped more than 50% then ok, otherwise needs new MRI Lspine

## 2018-12-18 NOTE — Telephone Encounter (Signed)
Please advise 

## 2018-12-18 NOTE — Telephone Encounter (Signed)
I called patient and advised. 

## 2018-12-18 NOTE — Telephone Encounter (Signed)
Yes. Ok    thanks

## 2018-12-18 NOTE — Telephone Encounter (Signed)
Ms. Cheryl [name] is s/p cervical spine fusion in 2017.  She received a massage gift certificate for Christmas and would like to know if it will be ok for her have this done with her surgical history.  Please call patient to discuss at [phone].

## 2018-12-19 ENCOUNTER — Other Ambulatory Visit (INDEPENDENT_AMBULATORY_CARE_PROVIDER_SITE_OTHER): Payer: Self-pay | Admitting: Physical Medicine and Rehabilitation

## 2018-12-19 MED ORDER — DIAZEPAM 5 MG PO TABS: ORAL_TABLET | ORAL | 0 refills | Status: DC

## 2018-12-19 NOTE — Telephone Encounter (Signed)
rx sent

## 2018-12-19 NOTE — Telephone Encounter (Signed)
Patient did not have 50% relief from last injections. MRI ordered, open MRI requested. Patient is also requesting Valium prior to MRI.

## 2018-12-24 ENCOUNTER — Ambulatory Visit
Admission: RE | Admit: 2018-12-24 | Discharge: 2018-12-24 | Disposition: A | Discharge status: Home / Self Care | Payer: Medicare Other | Source: Ambulatory Visit | Attending: Physical Medicine and Rehabilitation | Admitting: Physical Medicine and Rehabilitation

## 2018-12-24 DIAGNOSIS — M47816 Spondylosis without myelopathy or radiculopathy, lumbar region: Secondary | ICD-10-CM

## 2018-12-24 DIAGNOSIS — G8929 Other chronic pain: Secondary | ICD-10-CM

## 2018-12-24 DIAGNOSIS — M47817 Spondylosis without myelopathy or radiculopathy, lumbosacral region: Secondary | ICD-10-CM | POA: Diagnosis not present

## 2018-12-24 DIAGNOSIS — M545 Low back pain, unspecified: Secondary | ICD-10-CM

## 2019-01-02 ENCOUNTER — Encounter (INDEPENDENT_AMBULATORY_CARE_PROVIDER_SITE_OTHER): Payer: Self-pay | Admitting: Physical Medicine and Rehabilitation

## 2019-01-02 ENCOUNTER — Ambulatory Visit (INDEPENDENT_AMBULATORY_CARE_PROVIDER_SITE_OTHER): Payer: Medicare Other | Admitting: Physical Medicine and Rehabilitation

## 2019-01-02 VITALS — BP 140/75 | HR 84 | Ht 59.0 in | Wt 230.0 lb

## 2019-01-02 DIAGNOSIS — M47816 Spondylosis without myelopathy or radiculopathy, lumbar region: Secondary | ICD-10-CM

## 2019-01-02 DIAGNOSIS — Z6841 Body Mass Index (BMI) 40.0 and over, adult: Secondary | ICD-10-CM | POA: Diagnosis not present

## 2019-01-02 DIAGNOSIS — G8929 Other chronic pain: Secondary | ICD-10-CM

## 2019-01-02 DIAGNOSIS — M545 Low back pain, unspecified: Secondary | ICD-10-CM

## 2019-01-02 NOTE — Progress Notes (Signed)
.   .  Numeric Pain Rating Scale and Functional Assessment Average Pain 10 Pain Right Now 8 My pain is constant and aching Pain is worse with: walking, bending and some activites Pain improves with: medication   In the last MONTH (on 0-10 scale) has pain interfered with the following?  1. General activity like being  able to carry out your everyday physical activities such as walking, climbing stairs, carrying groceries, or moving a chair?  Rating(8)  2. Relation with others like being able to carry out your usual social activities and roles such as  activities at home, at work and in your community. Rating(8)  3. Enjoyment of life such that you have  been bothered by emotional problems such as feeling anxious, depressed or irritable?  Rating(4)

## 2019-01-03 ENCOUNTER — Encounter (INDEPENDENT_AMBULATORY_CARE_PROVIDER_SITE_OTHER): Payer: Self-pay | Admitting: Physical Medicine and Rehabilitation

## 2019-01-03 NOTE — Progress Notes (Signed)
Cheryl [name] - 73 y.o. female MRN [id number]  Date of birth: 01-26-1946  Office Visit Note: Visit Date: 01/02/2019 PCP: Sharilyn Sites, MD Referred by: Sharilyn Sites, MD  Subjective: Chief Complaint  Patient presents with  . Lower Back - Pain  . Left Hip - Pain  . Right Hip - Pain   HPI: Cheryl [name] is a 73 y.o. female who comes in today For reevaluation and continued management of her worsening axial low back pain worse left than right but bilateral.  She has no radicular leg pain no real changes from her last visit.  MRI of the lumbar spine was performed because prior MRI was from 2011.  She had mainly facet arthropathy on the prior MRI without stenosis or disc herniation.  We reviewed MRI today with her with spine model and the images.  Essentially she has similar findings without any stenosis.  She has had worsening facet arthropathy really L3-4 L4-5 and L5-S1.  Last injection was intra-articular injection on the left at L5-S1 which in the past that helped her quite a bit but recently it has not helped.  She also has morbid obesity with BMI of 46.4.  She does not really do home exercise program she has had physical therapy in the past.  No chiropractic care.  Medication management has included anti-inflammatory and pain medication and Tylenol.  She does not tolerate pain medicine very well.  No diagnosis of fibromyalgia noted.  No rheumatologic conditions.  He does have a history of gout.  She is had no focal weakness or bowel bladder difficulties.  Review of Systems  Constitutional: Negative for chills, fever, malaise/fatigue and weight loss.  HENT: Negative for hearing loss and sinus pain.   Eyes: Negative for blurred vision, double vision and photophobia.  Respiratory: Negative for cough and shortness of breath.   Cardiovascular: Negative for chest pain, palpitations and leg swelling.  Gastrointestinal: Negative for abdominal pain, nausea and vomiting.  Genitourinary: Negative for  flank pain.  Musculoskeletal: Positive for back pain. Negative for myalgias.  Skin: Negative for itching and rash.  Neurological: Negative for tremors, focal weakness and weakness.  Endo/Heme/Allergies: Negative.   Psychiatric/Behavioral: Negative for depression.  All other systems reviewed and are negative.  Otherwise per HPI.  Assessment & Plan: Visit Diagnoses:  1. Spondylosis without myelopathy or radiculopathy, lumbar region   2. Chronic bilateral low back pain without sciatica   3. Morbid (severe) obesity due to excess calories ([place])   4. BMI 45.0-49.9, adult [facility])     Plan: Findings:  Chronic worsening axial low back pain worse with standing and worse with facet joint loading on exam.  No radicular pain or paresthesia.  New MRI showing mainly facet arthropathy without stenosis or lateral recess narrowing or disc herniation.  Still think her pain is a combination of myofascial pain and facet arthropathy and being obese.  We talked about weight loss and exercise with her.  She would do well with a home exercise program and walking if she could.  I do think the next approach to this is bilateral medial branch blocks of the lower facet joints instead of just one area.  Her pain is worse left than right but bilateral.  No leg pain.  If she does well with diagnostic medial branch blocks would look at a double block paradigm and possibly radiofrequency ablation.  I do think with her size we still would be able to do the ablation with the 15 mm  cannulas.  Continue current medication plan and activity modification.    Meds & Orders: No orders of the defined types were placed in this encounter.  No orders of the defined types were placed in this encounter.   Follow-up: Return for Bilateral medial branch blocks of the lower facet joints..   Procedures: No procedures performed  No notes on file   Clinical History: MRI LUMBAR SPINE WITHOUT CONTRAST  TECHNIQUE: Multiplanar, multisequence  MR imaging of the lumbar spine was performed. No intravenous contrast was administered.  COMPARISON:  Radiography 03/31/2016.  MRI 01/10/2010.  FINDINGS: Segmentation:  5 lumbar type vertebral bodies.  Alignment:  1 mm anterolisthesis L3-4.  3 mm anterolisthesis L5-S1.  Vertebrae: Benign hemangioma within the anterior L2 vertebral body. No evidence of fracture or worrisome bone lesion.  Conus medullaris and cauda equina: Conus extends to the L1-2 level. Conus and cauda equina appear normal.  Paraspinal and other soft tissues: Bilateral renal cysts.  Disc levels:  Normal at L2-3 and above.  L3-4: Facet osteoarthritis worse on the left. 1 mm of anterolisthesis. Minimal bulging of the disc. No compressive stenosis.  L4-5: Mild facet osteoarthritis. Mild bulging of the disc. No canal or foraminal stenosis.  L5-S1: Bilateral facet osteoarthritis with 3 mm of anterolisthesis. Mild bulging of the disc. No compressive stenosis. This anterolisthesis could worsen with standing or flexion.  Compared to the study 2011, findings are quite similar, with only mild generalized worsening.  IMPRESSION: No compressive stenosis of the canal or foramina.  Degenerative disc disease and degenerative facet disease in the lower lumbar spine which could be associated with back pain or referred facet syndrome pain. Findings are most pronounced at L5-S1 where there is degenerative anterolisthesis of 3 mm. This could worsen with standing or flexion based on the morphology of the facet arthropathy. Findings have worsened slightly in a generalized fashion since the study of 2011.   Electronically Signed   By: Nelson Chimes M.D.   On: 12/24/2018 09:08  LUMBAR SPINE - COMPLETE 4+ VIEW  COMPARISON: None.  FINDINGS: Degenerative facet disease in the lower lumbar spine. Slight anterolisthesis of L5 on S1. No fracture. Disc spaces are maintained. SI joints are symmetric and  unremarkable.  IMPRESSION: Degenerative facet disease in the lower lumbar spine. No acute findings.   Electronically Signed By: Rolm Baptise M.D. On: 03/31/2016 21:39   She reports that she quit smoking about 13 years ago. Her smoking use included cigarettes. She has a 4.80 pack-year smoking history. She has never used smokeless tobacco. No results for input(s): HGBA1C, LABURIC in the last 8760 hours.  Objective:  VS:  HT:4\' 11"  (149.9 cm)   WT:230 lb (104.3 kg)  BMI:46.43    BP:140/75  HR:84bpm  TEMP: ( )  RESP:97 % Physical Exam Vitals signs and nursing note reviewed.  Constitutional:      General: She is not in acute distress.    Appearance: Normal appearance. She is well-developed. She is obese. She is not ill-appearing.  HENT:     Head: Normocephalic and atraumatic.  Eyes:     Conjunctiva/sclera: Conjunctivae normal.     Pupils: Pupils are equal, round, and reactive to light.  Cardiovascular:     Rate and Rhythm: Normal rate.     Pulses: Normal pulses.  Pulmonary:     Effort: Pulmonary effort is normal.  Musculoskeletal:     Right lower leg: No edema.     Left lower leg: No edema.     Comments:  Patient ambulates without aid but she is slow to rise from a seated position to full extension.  She has concordant pain with lumbar facet loading and extension.  She has no paraspinal tenderness.  She has mild tenderness over the PSIS and greater trochanters bilaterally.  No pain with hip rotation.  Good distal strength without clonus.  Skin:    General: Skin is warm and dry.     Findings: No erythema or rash.  Neurological:     General: No focal deficit present.     Mental Status: She is alert and oriented to person, place, and time.     Sensory: No sensory deficit.     Motor: No abnormal muscle tone.     Coordination: Coordination normal.     Gait: Gait normal.  Psychiatric:        Mood and Affect: Mood normal.        Behavior: Behavior normal.     Ortho  Exam Imaging: No results found.  Past Medical/Family/Surgical/Social History: Medications & Allergies reviewed per EMR, new medications updated. Patient Active Problem List   Diagnosis Date Noted  . Herniated cervical intervertebral disc 11/19/2016  . Cervical disc disorder at C5-C6 level with radiculopathy 10/21/2016  . Gastric ulceration   . GERD (gastroesophageal reflux disease) 05/11/2016  . Constipation 05/11/2016  . Hashimoto's thyroiditis 02/20/2016  . History of colonic polyps   . Diverticulosis of colon without hemorrhage   . Complete tear of right rotator cuff 01/21/2015  . Severe obesity (BMI >= 40) ([place]) 01/21/2015  . Nausea without vomiting 04/04/2013  . H/O adenomatous polyp of colon 04/04/2013  . Fatty liver 11/11/2010  . RUQ PAIN 11/11/2010  . NONSPEC ELEVATION OF LEVELS OF TRANSAMINASE/LDH 11/11/2010  . COLONIC POLYPS, HYPERPLASTIC, HX OF 06/25/2010  . ANEMIA-NOS 06/24/2010   Past Medical History:  Diagnosis Date  . Acid reflux   . Arthritis   . Neuromuscular disorder ([place])   . Sinus drainage   . Sleep apnea    had surgery to correct   Family History  Problem Relation Age of Onset  . Cancer Mother   . Cancer Father   . Diabetes Sister   . Colon cancer Neg Hx   . Liver disease Neg Hx    Past Surgical History:  Procedure Laterality Date  . ANTERIOR CERVICAL DECOMP/DISCECTOMY FUSION N/A 11/19/2016   Procedure: ANTERIOR CERVICAL DISCECTOMY AND FUSION C5-6 WITH PLATES, SCREWS, CAGE, VIVIGEN II;  Surgeon: Jessy Oto, MD;  Location: [facility];  Service: Orthopedics;  Laterality: N/A;  . CARPAL TUNNEL RELEASE    . CATARACT EXTRACTION, BILATERAL Bilateral 2017   One in December 2017 and one in January 2018  . COLONOSCOPY  02/15/2005   FWY:[id number] small polyps ablated via cold biopsy, one from transverse colon and two from the rectum/Small external hemorrhoids  . COLONOSCOPY  07/10/2010   [id number]:[id number] rectum/long redundant colon, polyps in the sigmoid, descending,  hepatic flexure/ADENOMATOUS POLYPS. next TCS due 06/2015  . COLONOSCOPY  1995   3 polyps, path revealed chronic colitis  . COLONOSCOPY N/A 08/05/2015   Procedure: COLONOSCOPY;  Surgeon: Daneil Dolin, MD;  [facility];  Service: Endoscopy;  Laterality: N/A;  1000  . ESOPHAGOGASTRODUODENOSCOPY  1995   Gastritis  . ESOPHAGOGASTRODUODENOSCOPY N/A 04/16/2013   RMR: [facility]  . ESOPHAGOGASTRODUODENOSCOPY N/A 05/13/2016   Procedure: ESOPHAGOGASTRODUODENOSCOPY (EGD);  Surgeon: Daneil Dolin, MD;  [facility];  Service: Endoscopy;  Laterality: N/A;  145  . ESOPHAGOGASTRODUODENOSCOPY  N/A 08/18/2016   Procedure: ESOPHAGOGASTRODUODENOSCOPY (EGD);  Surgeon: Daneil Dolin, MD;  [facility];  Service: Endoscopy;  Laterality: N/A;  7:45 AM  . KNEE SURGERY     x2  . ROTATOR CUFF REPAIR     x2  . SHOULDER ARTHROSCOPY WITH ROTATOR CUFF REPAIR AND SUBACROMIAL DECOMPRESSION Right 01/21/2015   Procedure: RIGHT SHOULDER ARTHROSCOPIC DEBRIDEMNT OF G-H JOINT AND REMOVAL OF LOOSE BODIES,ARTHROSCOPIC SUBACROMIAL DECOMPRESSION,MINI OPEN RCT REPAIR WITH SUPPLEMENTAL [facility] PATCH;  Surgeon: Garald Balding, MD;  Location: [facility];  Service: Orthopedics;  Laterality: Right;  . TONSILLECTOMY    . TRIGGER FINGER RELEASE    . UVULOPALATOPHARYNGOPLASTY     Social History   Occupational History  . Occupation: BUS DRIVER    Employer: [employer]  Tobacco Use  . Smoking status: Former Smoker    Packs/day: 0.10    Years: 48.00    Pack years: 4.80    Types: Cigarettes    Last attempt to quit: 05/11/2005    Years since quitting: 13.6  . Smokeless tobacco: Never Used  Substance and Sexual Activity  . Alcohol use: No    Alcohol/week: 0.0 standard drinks  . Drug use: No  . Sexual activity: Not on file

## 2019-01-16 ENCOUNTER — Telehealth (INDEPENDENT_AMBULATORY_CARE_PROVIDER_SITE_OTHER): Payer: Self-pay | Admitting: *Deleted

## 2019-01-16 ENCOUNTER — Other Ambulatory Visit (INDEPENDENT_AMBULATORY_CARE_PROVIDER_SITE_OTHER): Payer: Self-pay | Admitting: Physical Medicine and Rehabilitation

## 2019-01-16 DIAGNOSIS — Z1389 Encounter for screening for other disorder: Secondary | ICD-10-CM | POA: Diagnosis not present

## 2019-01-16 DIAGNOSIS — J019 Acute sinusitis, unspecified: Secondary | ICD-10-CM | POA: Diagnosis not present

## 2019-01-16 DIAGNOSIS — Z6841 Body Mass Index (BMI) 40.0 and over, adult: Secondary | ICD-10-CM | POA: Diagnosis not present

## 2019-01-16 DIAGNOSIS — R0981 Nasal congestion: Secondary | ICD-10-CM | POA: Diagnosis not present

## 2019-01-16 DIAGNOSIS — R05 Cough: Secondary | ICD-10-CM | POA: Diagnosis not present

## 2019-01-16 MED ORDER — DIAZEPAM 5 MG PO TABS: ORAL_TABLET | ORAL | 0 refills | Status: DC

## 2019-01-16 NOTE — Telephone Encounter (Signed)
Called pt and advised.  

## 2019-01-16 NOTE — Telephone Encounter (Signed)
done

## 2019-01-16 NOTE — Progress Notes (Signed)
Pre-procedure diazepam ordered for pre-operative anxiety.  

## 2019-01-18 ENCOUNTER — Ambulatory Visit (INDEPENDENT_AMBULATORY_CARE_PROVIDER_SITE_OTHER): Payer: Medicare Other | Admitting: Physical Medicine and Rehabilitation

## 2019-01-18 ENCOUNTER — Ambulatory Visit (INDEPENDENT_AMBULATORY_CARE_PROVIDER_SITE_OTHER): Payer: Self-pay

## 2019-01-18 ENCOUNTER — Encounter (INDEPENDENT_AMBULATORY_CARE_PROVIDER_SITE_OTHER): Payer: Self-pay | Admitting: Physical Medicine and Rehabilitation

## 2019-01-18 VITALS — BP 137/80 | HR 86 | Temp 98.0°F

## 2019-01-18 DIAGNOSIS — M47816 Spondylosis without myelopathy or radiculopathy, lumbar region: Secondary | ICD-10-CM

## 2019-01-18 MED ORDER — BUPIVACAINE HCL 0.5 % IJ SOLN: 3.0000 mL | Freq: Once | INTRAMUSCULAR | Status: DC

## 2019-01-18 NOTE — Progress Notes (Signed)
.     Numeric Pain Rating Scale and Functional Assessment Average Pain 10   In the last MONTH (on 0-10 scale) has pain interfered with the following?  1. General activity like being  able to carry out your everyday physical activities such as walking, climbing stairs, carrying groceries, or moving a chair?  Rating(7)   +Driver, -BT, -Dye Allergies.  

## 2019-01-19 NOTE — Progress Notes (Signed)
Cheryl [name] - 73 y.o. female MRN [id number]  Date of birth: 1946/01/30  Office Visit Note: Visit Date: 01/18/2019 PCP: Sharilyn Sites, MD Referred by: Sharilyn Sites, MD  Subjective: Chief Complaint  Patient presents with  . Lower Back - Pain   HPI:  Cheryl [name] is a 73 y.o. female who comes in today For planned bilateral L3-4, L4-5 and L5-S1 medial branch blocks.  Please see our prior note for further details and justification.  Briefly she is had chronic worsening low back pain with really no relief from recent intra-articular injection which used to help her quite a bit for known facet joint arthritis and facet joint cyst.  MRI had been updated showing multilevel facet arthropathy.  Injection today is diagnostic and she was given a pain diary.  If she does well we would look at double block paradigm and may be radiofrequency ablation.  ROS Otherwise per HPI.  Assessment & Plan: Visit Diagnoses:  1. Spondylosis without myelopathy or radiculopathy, lumbar region     Plan: No additional findings.   Meds & Orders:  Meds ordered this encounter  Medications  . bupivacaine (MARCAINE) 0.5 % (with pres) injection 3 mL    Orders Placed This Encounter  Procedures  . Facet Injection  . XR C-ARM NO REPORT    Follow-up: Return for Review Pain Diary.   Procedures: No procedures performed  Lumbar Diagnostic Facet Joint Nerve Block with Fluoroscopic Guidance   Patient: [name]      Date of Birth: [birth year] MRN: [id number] PCP: Sharilyn Sites, MD      Visit Date: 01/18/2019   Universal Protocol:    Date/Time: 01/31/206:12 AM  Consent Given By: the patient  Position: PRONE  Additional Comments: Vital signs were monitored before and after the procedure. Patient was prepped and draped in the usual sterile fashion. The correct patient, procedure, and site was verified.   Injection Procedure Details:  Procedure Site One Meds Administered:  Meds ordered this encounter    Medications  . bupivacaine (MARCAINE) 0.5 % (with pres) injection 3 mL     Laterality: Bilateral  Location/Site:  L3-L4 L4-L5 L5-S1  Needle size: 22 ga.  Needle type:spinal  Needle Placement: Oblique pedical  Findings:   -Comments: There was excellent flow of contrast along the articular pillars without intravascular flow.  Procedure Details: The fluoroscope beam is vertically oriented in AP and then obliqued 15 to 20 degrees to the ipsilateral side of the desired nerve to achieve the "Scotty dog" appearance.  The skin over the target area of the junction of the superior articulating process and the transverse process (sacral ala if blocking the L5 dorsal rami) was locally anesthetized with a 1 ml volume of 1% Lidocaine without Epinephrine.  The spinal needle was inserted and advanced in a trajectory view down to the target.   After contact with periosteum and negative aspirate for blood and CSF, correct placement without intravascular or epidural spread was confirmed by injecting 0.5 ml. of Isovue-250.  A spot radiograph was obtained of this image.    Next, a 0.5 ml. volume of the injectate described above was injected. The needle was then redirected to the other facet joint nerves mentioned above if needed.  Prior to the procedure, the patient was given a Pain Diary which was completed for baseline measurements.  After the procedure, the patient rated their pain every 30 minutes and will continue rating at this frequency for a total of 5 hours.  The patient has been asked to complete the Diary and return to [place] by mail, fax or hand delivered as soon as possible.   Additional Comments:  The patient tolerated the procedure well Dressing: Band-Aid    Post-procedure details: Patient was observed during the procedure. Post-procedure instructions were reviewed.  Patient left the clinic in stable condition.   Clinical History: MRI LUMBAR SPINE WITHOUT  CONTRAST  TECHNIQUE: Multiplanar, multisequence MR imaging of the lumbar spine was performed. No intravenous contrast was administered.  COMPARISON:  Radiography 03/31/2016.  MRI 01/10/2010.  FINDINGS: Segmentation:  5 lumbar type vertebral bodies.  Alignment:  1 mm anterolisthesis L3-4.  3 mm anterolisthesis L5-S1.  Vertebrae: Benign hemangioma within the anterior L2 vertebral body. No evidence of fracture or worrisome bone lesion.  Conus medullaris and cauda equina: Conus extends to the L1-2 level. Conus and cauda equina appear normal.  Paraspinal and other soft tissues: Bilateral renal cysts.  Disc levels:  Normal at L2-3 and above.  L3-4: Facet osteoarthritis worse on the left. 1 mm of anterolisthesis. Minimal bulging of the disc. No compressive stenosis.  L4-5: Mild facet osteoarthritis. Mild bulging of the disc. No canal or foraminal stenosis.  L5-S1: Bilateral facet osteoarthritis with 3 mm of anterolisthesis. Mild bulging of the disc. No compressive stenosis. This anterolisthesis could worsen with standing or flexion.  Compared to the study 2011, findings are quite similar, with only mild generalized worsening.  IMPRESSION: No compressive stenosis of the canal or foramina.  Degenerative disc disease and degenerative facet disease in the lower lumbar spine which could be associated with back pain or referred facet syndrome pain. Findings are most pronounced at L5-S1 where there is degenerative anterolisthesis of 3 mm. This could worsen with standing or flexion based on the morphology of the facet arthropathy. Findings have worsened slightly in a generalized fashion since the study of 2011.   Electronically Signed   By: Nelson Chimes M.D.   On: 12/24/2018 09:08  LUMBAR SPINE - COMPLETE 4+ VIEW  COMPARISON: None.  FINDINGS: Degenerative facet disease in the lower lumbar spine. Slight anterolisthesis of L5 on S1. No fracture. Disc  spaces are maintained. SI joints are symmetric and unremarkable.  IMPRESSION: Degenerative facet disease in the lower lumbar spine. No acute findings.   Electronically Signed By: Rolm Baptise M.D. On: 03/31/2016 21:39     Objective:  VS:  HT:    WT:   BMI:     BP:137/80  HR:86bpm  TEMP:98 F (36.7 C)(Oral)  RESP:  Physical Exam  Ortho Exam Imaging: Xr C-arm No Report  Result Date: 01/18/2019 Please see Notes tab for imaging impression.

## 2019-01-19 NOTE — Procedures (Signed)
Lumbar Diagnostic Facet Joint Nerve Block with Fluoroscopic Guidance   Patient: Cheryl [name]      Date of Birth: 04/02/1946 MRN: [id number] PCP: Sharilyn Sites, MD      Visit Date: 01/18/2019   Universal Protocol:    Date/Time: 01/31/206:12 AM  Consent Given By: the patient  Position: PRONE  Additional Comments: Vital signs were monitored before and after the procedure. Patient was prepped and draped in the usual sterile fashion. The correct patient, procedure, and site was verified.   Injection Procedure Details:  Procedure Site One Meds Administered:  Meds ordered this encounter  Medications  . bupivacaine (MARCAINE) 0.5 % (with pres) injection 3 mL     Laterality: Bilateral  Location/Site:  L3-L4 L4-L5 L5-S1  Needle size: 22 ga.  Needle type:spinal  Needle Placement: Oblique pedical  Findings:   -Comments: There was excellent flow of contrast along the articular pillars without intravascular flow.  Procedure Details: The fluoroscope beam is vertically oriented in AP and then obliqued 15 to 20 degrees to the ipsilateral side of the desired nerve to achieve the "Scotty dog" appearance.  The skin over the target area of the junction of the superior articulating process and the transverse process (sacral ala if blocking the L5 dorsal rami) was locally anesthetized with a 1 ml volume of 1% Lidocaine without Epinephrine.  The spinal needle was inserted and advanced in a trajectory view down to the target.   After contact with periosteum and negative aspirate for blood and CSF, correct placement without intravascular or epidural spread was confirmed by injecting 0.5 ml. of Isovue-250.  A spot radiograph was obtained of this image.    Next, a 0.5 ml. volume of the injectate described above was injected. The needle was then redirected to the other facet joint nerves mentioned above if needed.  Prior to the procedure, the patient was given a Pain Diary which was completed  for baseline measurements.  After the procedure, the patient rated their pain every 30 minutes and will continue rating at this frequency for a total of 5 hours.  The patient has been asked to complete the Diary and return to [place] by mail, fax or hand delivered as soon as possible.   Additional Comments:  The patient tolerated the procedure well Dressing: Band-Aid    Post-procedure details: Patient was observed during the procedure. Post-procedure instructions were reviewed.  Patient left the clinic in stable condition.

## 2019-01-25 ENCOUNTER — Telehealth (INDEPENDENT_AMBULATORY_CARE_PROVIDER_SITE_OTHER): Payer: Self-pay | Admitting: *Deleted

## 2019-01-25 NOTE — Telephone Encounter (Signed)
For code [phone] no pa is needed, reference#[id number]

## 2019-02-12 ENCOUNTER — Telehealth (INDEPENDENT_AMBULATORY_CARE_PROVIDER_SITE_OTHER): Payer: Self-pay | Admitting: Physical Medicine and Rehabilitation

## 2019-02-12 NOTE — Telephone Encounter (Signed)
Pt came in the office asking if she can be called in some medication to calm her nerves for her inj tomorrow.

## 2019-02-13 ENCOUNTER — Other Ambulatory Visit (INDEPENDENT_AMBULATORY_CARE_PROVIDER_SITE_OTHER): Payer: Self-pay | Admitting: Physical Medicine and Rehabilitation

## 2019-02-13 ENCOUNTER — Ambulatory Visit (INDEPENDENT_AMBULATORY_CARE_PROVIDER_SITE_OTHER): Payer: Medicare Other | Admitting: Physical Medicine and Rehabilitation

## 2019-02-13 ENCOUNTER — Encounter (INDEPENDENT_AMBULATORY_CARE_PROVIDER_SITE_OTHER): Payer: Self-pay | Admitting: Physical Medicine and Rehabilitation

## 2019-02-13 ENCOUNTER — Ambulatory Visit (INDEPENDENT_AMBULATORY_CARE_PROVIDER_SITE_OTHER): Payer: Self-pay

## 2019-02-13 VITALS — BP 113/67 | HR 89 | Temp 98.1°F

## 2019-02-13 DIAGNOSIS — M545 Low back pain, unspecified: Secondary | ICD-10-CM

## 2019-02-13 DIAGNOSIS — M47816 Spondylosis without myelopathy or radiculopathy, lumbar region: Secondary | ICD-10-CM | POA: Diagnosis not present

## 2019-02-13 DIAGNOSIS — G8929 Other chronic pain: Secondary | ICD-10-CM

## 2019-02-13 MED ORDER — BUPIVACAINE HCL 0.5 % IJ SOLN: 3.0000 mL | Freq: Once | INTRAMUSCULAR | Status: DC

## 2019-02-13 MED ORDER — DIAZEPAM 5 MG PO TABS: ORAL_TABLET | ORAL | 0 refills | Status: DC

## 2019-02-13 NOTE — Telephone Encounter (Signed)
Called pt and she states she had another valium leftover from last inj.

## 2019-02-13 NOTE — Telephone Encounter (Signed)
Pt would like to have valium prior to inj 02/14/2019, pharmacy is updated [facility] on [address]. Please Advise.

## 2019-02-13 NOTE — Telephone Encounter (Signed)
DONE TODAY BUT LOOKS LIKE SHE IS BEEING SEEN TODAY

## 2019-02-13 NOTE — Progress Notes (Signed)
.  Numeric Pain Rating Scale and Functional Assessment Average Pain 9   In the last MONTH (on 0-10 scale) has pain interfered with the following?  1. General activity like being  able to carry out your everyday physical activities such as walking, climbing stairs, carrying groceries, or moving a chair?  Rating(8)   +Driver, -BT, -Dye Allergies.  

## 2019-02-14 NOTE — Progress Notes (Signed)
Cheryl [name] - 73 y.o. female MRN [id number]  Date of birth: Nov 02, 1946  Office Visit Note: Visit Date: 02/13/2019 PCP: Sharilyn Sites, MD Referred by: Sharilyn Sites, MD  Subjective: Chief Complaint  Patient presents with  . Lower Back - Pain   HPI: Cheryl [name] is a 73 y.o. female who comes in today For second medial branch block at L3-4 L4-5 and L5-S1 bilaterally for bilateral low back pain.  This is a second block and a double block paradigm.  Please see our prior notes for further justification but if she does well with this we would look at radiofrequency ablation for more definitive treatment.  She has failed conservative care with physical therapy time and medication as well as prior facet joint injections in the remote past.  Last medial branch block gave her quite a bit of relief and we do have pain diary confirmation.  On exam she has pain with facet joint loading and she has no radicular complaints.  ROS Otherwise per HPI.  Assessment & Plan: Visit Diagnoses:  1. Spondylosis without myelopathy or radiculopathy, lumbar region   2. Chronic bilateral low back pain without sciatica     Plan: No additional findings.   Meds & Orders:  Meds ordered this encounter  Medications  . bupivacaine (MARCAINE) 0.5 % (with pres) injection 3 mL    Orders Placed This Encounter  Procedures  . Facet Injection  . XR C-ARM NO REPORT    Follow-up: Return for Review Pain Diary.   Procedures: No procedures performed  Lumbar Diagnostic Facet Joint Nerve Block with Fluoroscopic Guidance   Patient: Cheryl [name]      Date of Birth: [birth year] MRN: [id number] PCP: Sharilyn Sites, MD      Visit Date: 02/13/2019   Universal Protocol:    Date/Time: 02/26/205:49 AM  Consent Given By: the patient  Position: PRONE  Additional Comments: Vital signs were monitored before and after the procedure. Patient was prepped and draped in the usual sterile fashion. The correct patient, procedure,  and site was verified.   Injection Procedure Details:  Procedure Site One Meds Administered:  Meds ordered this encounter  Medications  . bupivacaine (MARCAINE) 0.5 % (with pres) injection 3 mL     Laterality: Bilateral  Location/Site:  L3-L4 L4-L5 L5-S1  Needle size: 22 ga.  Needle type:spinal  Needle Placement: Oblique pedical  Findings:   -Comments: There was excellent flow of contrast along the articular pillars without intravascular flow.  Procedure Details: The fluoroscope beam is vertically oriented in AP and then obliqued 15 to 20 degrees to the ipsilateral side of the desired nerve to achieve the "Scotty dog" appearance.  The skin over the target area of the junction of the superior articulating process and the transverse process (sacral ala if blocking the L5 dorsal rami) was locally anesthetized with a 1 ml volume of 1% Lidocaine without Epinephrine.  The spinal needle was inserted and advanced in a trajectory view down to the target.   After contact with periosteum and negative aspirate for blood and CSF, correct placement without intravascular or epidural spread was confirmed by injecting 0.5 ml. of Isovue-250.  A spot radiograph was obtained of this image.    Next, a 0.5 ml. volume of the injectate described above was injected. The needle was then redirected to the other facet joint nerves mentioned above if needed.  Prior to the procedure, the patient was given a Pain Diary which was completed for baseline measurements.  After the procedure, the patient rated their pain every 30 minutes and will continue rating at this frequency for a total of 5 hours.  The patient has been asked to complete the Diary and return to [place] by mail, fax or hand delivered as soon as possible.   Additional Comments:  The patient tolerated the procedure well Dressing: 2 x 2 sterile gauze and Band-Aid    Post-procedure details: Patient was observed during the  procedure. Post-procedure instructions were reviewed.  Patient left the clinic in stable condition.   Clinical History: MRI LUMBAR SPINE WITHOUT CONTRAST  TECHNIQUE: Multiplanar, multisequence MR imaging of the lumbar spine was performed. No intravenous contrast was administered.  COMPARISON:  Radiography 03/31/2016.  MRI 01/10/2010.  FINDINGS: Segmentation:  5 lumbar type vertebral bodies.  Alignment:  1 mm anterolisthesis L3-4.  3 mm anterolisthesis L5-S1.  Vertebrae: Benign hemangioma within the anterior L2 vertebral body. No evidence of fracture or worrisome bone lesion.  Conus medullaris and cauda equina: Conus extends to the L1-2 level. Conus and cauda equina appear normal.  Paraspinal and other soft tissues: Bilateral renal cysts.  Disc levels:  Normal at L2-3 and above.  L3-4: Facet osteoarthritis worse on the left. 1 mm of anterolisthesis. Minimal bulging of the disc. No compressive stenosis.  L4-5: Mild facet osteoarthritis. Mild bulging of the disc. No canal or foraminal stenosis.  L5-S1: Bilateral facet osteoarthritis with 3 mm of anterolisthesis. Mild bulging of the disc. No compressive stenosis. This anterolisthesis could worsen with standing or flexion.  Compared to the study 2011, findings are quite similar, with only mild generalized worsening.  IMPRESSION: No compressive stenosis of the canal or foramina.  Degenerative disc disease and degenerative facet disease in the lower lumbar spine which could be associated with back pain or referred facet syndrome pain. Findings are most pronounced at L5-S1 where there is degenerative anterolisthesis of 3 mm. This could worsen with standing or flexion based on the morphology of the facet arthropathy. Findings have worsened slightly in a generalized fashion since the study of 2011.   Electronically Signed   By: Nelson Chimes M.D.   On: 12/24/2018 09:08  LUMBAR SPINE - COMPLETE 4+  VIEW  COMPARISON: None.  FINDINGS: Degenerative facet disease in the lower lumbar spine. Slight anterolisthesis of L5 on S1. No fracture. Disc spaces are maintained. SI joints are symmetric and unremarkable.  IMPRESSION: Degenerative facet disease in the lower lumbar spine. No acute findings.   Electronically Signed By: Rolm Baptise M.D. On: 03/31/2016 21:39   She reports that she quit smoking about 13 years ago. Her smoking use included cigarettes. She has a 4.80 pack-year smoking history. She has never used smokeless tobacco. No results for input(s): HGBA1C, LABURIC in the last 8760 hours.  Objective:  VS:  HT:    WT:   BMI:     BP:113/67  HR:89bpm  TEMP:98.1 F (36.7 C)(Oral)  RESP:  Physical Exam  Ortho Exam Imaging: Xr C-arm No Report  Result Date: 02/13/2019 Please see Notes tab for imaging impression.   Past Medical/Family/Surgical/Social History: Medications & Allergies reviewed per EMR, new medications updated. Patient Active Problem List   Diagnosis Date Noted  . Herniated cervical intervertebral disc 11/19/2016  . Cervical disc disorder at C5-C6 level with radiculopathy 10/21/2016  . Gastric ulceration   . GERD (gastroesophageal reflux disease) 05/11/2016  . Constipation 05/11/2016  . Hashimoto's thyroiditis 02/20/2016  . History of colonic polyps   . Diverticulosis of colon without hemorrhage   .  Complete tear of right rotator cuff 01/21/2015  . Severe obesity (BMI >= 40) ([place]) 01/21/2015  . Nausea without vomiting 04/04/2013  . H/O adenomatous polyp of colon 04/04/2013  . Fatty liver 11/11/2010  . RUQ PAIN 11/11/2010  . NONSPEC ELEVATION OF LEVELS OF TRANSAMINASE/LDH 11/11/2010  . COLONIC POLYPS, HYPERPLASTIC, HX OF 06/25/2010  . ANEMIA-NOS 06/24/2010   Past Medical History:  Diagnosis Date  . Acid reflux   . Arthritis   . Neuromuscular disorder ([place])   . Sinus drainage   . Sleep apnea    had surgery to correct   Family History   Problem Relation Age of Onset  . Cancer Mother   . Cancer Father   . Diabetes Sister   . Colon cancer Neg Hx   . Liver disease Neg Hx    Past Surgical History:  Procedure Laterality Date  . ANTERIOR CERVICAL DECOMP/DISCECTOMY FUSION N/A 11/19/2016   Procedure: ANTERIOR CERVICAL DISCECTOMY AND FUSION C5-6 WITH PLATES, SCREWS, CAGE, VIVIGEN II;  Surgeon: Jessy Oto, MD;  Location: [facility];  Service: Orthopedics;  Laterality: N/A;  . CARPAL TUNNEL RELEASE    . CATARACT EXTRACTION, BILATERAL Bilateral 2017   One in December 2017 and one in January 2018  . COLONOSCOPY  02/15/2005   ZOX:WRUEA small polyps ablated via cold biopsy, one from transverse colon and two from the rectum/Small external hemorrhoids  . COLONOSCOPY  07/10/2010   VWU:[id number] rectum/long redundant colon, polyps in the sigmoid, descending, hepatic flexure/ADENOMATOUS POLYPS. next TCS due 06/2015  . COLONOSCOPY  1995   3 polyps, path revealed chronic colitis  . COLONOSCOPY N/A 08/05/2015   Procedure: COLONOSCOPY;  Surgeon: Daneil Dolin, MD;  [facility];  Service: Endoscopy;  Laterality: N/A;  1000  . ESOPHAGOGASTRODUODENOSCOPY  1995   Gastritis  . ESOPHAGOGASTRODUODENOSCOPY N/A 04/16/2013   RMR: HH  . ESOPHAGOGASTRODUODENOSCOPY N/A 05/13/2016   Procedure: ESOPHAGOGASTRODUODENOSCOPY (EGD);  Surgeon: Daneil Dolin, MD;  [facility];  Service: Endoscopy;  Laterality: N/A;  145  . ESOPHAGOGASTRODUODENOSCOPY N/A 08/18/2016   Procedure: ESOPHAGOGASTRODUODENOSCOPY (EGD);  Surgeon: Daneil Dolin, MD;  [facility];  Service: Endoscopy;  Laterality: N/A;  7:45 AM  . KNEE SURGERY     x2  . ROTATOR CUFF REPAIR     x2  . SHOULDER ARTHROSCOPY WITH ROTATOR CUFF REPAIR AND SUBACROMIAL DECOMPRESSION Right 01/21/2015   Procedure: RIGHT SHOULDER ARTHROSCOPIC DEBRIDEMNT OF G-H JOINT AND REMOVAL OF LOOSE BODIES,ARTHROSCOPIC SUBACROMIAL DECOMPRESSION,MINI OPEN RCT REPAIR WITH SUPPLEMENTAL [facility] PATCH;   Surgeon: Garald Balding, MD;  Location: [place];  Service: Orthopedics;  Laterality: Right;  . TONSILLECTOMY    . TRIGGER FINGER RELEASE    . UVULOPALATOPHARYNGOPLASTY     Social History   Occupational History  . Occupation: BUS DRIVER    Employer: [employer]  Tobacco Use  . Smoking status: Former Smoker    Packs/day: 0.10    Years: 48.00    Pack years: 4.80    Types: Cigarettes    Last attempt to quit: 05/11/2005    Years since quitting: 13.7  . Smokeless tobacco: Never Used  Substance and Sexual Activity  . Alcohol use: No    Alcohol/week: 0.0 standard drinks  . Drug use: No  . Sexual activity: Not on file

## 2019-02-14 NOTE — Procedures (Signed)
Lumbar Diagnostic Facet Joint Nerve Block with Fluoroscopic Guidance   Patient: Cheryl [name]      Date of Birth: 1946-07-17 MRN: [id number] PCP: Sharilyn Sites, MD      Visit Date: 02/13/2019   Universal Protocol:    Date/Time: 02/26/205:49 AM  Consent Given By: the patient  Position: PRONE  Additional Comments: Vital signs were monitored before and after the procedure. Patient was prepped and draped in the usual sterile fashion. The correct patient, procedure, and site was verified.   Injection Procedure Details:  Procedure Site One Meds Administered:  Meds ordered this encounter  Medications  . bupivacaine (MARCAINE) 0.5 % (with pres) injection 3 mL     Laterality: Bilateral  Location/Site:  L3-L4 L4-L5 L5-S1  Needle size: 22 ga.  Needle type:spinal  Needle Placement: Oblique pedical  Findings:   -Comments: There was excellent flow of contrast along the articular pillars without intravascular flow.  Procedure Details: The fluoroscope beam is vertically oriented in AP and then obliqued 15 to 20 degrees to the ipsilateral side of the desired nerve to achieve the "Scotty dog" appearance.  The skin over the target area of the junction of the superior articulating process and the transverse process (sacral ala if blocking the L5 dorsal rami) was locally anesthetized with a 1 ml volume of 1% Lidocaine without Epinephrine.  The spinal needle was inserted and advanced in a trajectory view down to the target.   After contact with periosteum and negative aspirate for blood and CSF, correct placement without intravascular or epidural spread was confirmed by injecting 0.5 ml. of Isovue-250.  A spot radiograph was obtained of this image.    Next, a 0.5 ml. volume of the injectate described above was injected. The needle was then redirected to the other facet joint nerves mentioned above if needed.  Prior to the procedure, the patient was given a Pain Diary which was completed  for baseline measurements.  After the procedure, the patient rated their pain every 30 minutes and will continue rating at this frequency for a total of 5 hours.  The patient has been asked to complete the Diary and return to [place] by mail, fax or hand delivered as soon as possible.   Additional Comments:  The patient tolerated the procedure well Dressing: 2 x 2 sterile gauze and Band-Aid    Post-procedure details: Patient was observed during the procedure. Post-procedure instructions were reviewed.  Patient left the clinic in stable condition.

## 2019-02-26 ENCOUNTER — Telehealth (INDEPENDENT_AMBULATORY_CARE_PROVIDER_SITE_OTHER): Payer: Self-pay | Admitting: *Deleted

## 2019-02-26 NOTE — Telephone Encounter (Signed)
Faxed PA for RFA to [facility] to [phone]

## 2019-02-28 ENCOUNTER — Encounter: Payer: Self-pay | Admitting: Nurse Practitioner

## 2019-02-28 ENCOUNTER — Other Ambulatory Visit: Payer: Self-pay

## 2019-02-28 ENCOUNTER — Ambulatory Visit (INDEPENDENT_AMBULATORY_CARE_PROVIDER_SITE_OTHER): Payer: Medicare Other | Admitting: Nurse Practitioner

## 2019-02-28 VITALS — BP 123/66 | HR 86 | Temp 99.0°F | Ht 59.0 in | Wt 230.4 lb

## 2019-02-28 DIAGNOSIS — K219 Gastro-esophageal reflux disease without esophagitis: Secondary | ICD-10-CM | POA: Diagnosis not present

## 2019-02-28 DIAGNOSIS — K5900 Constipation, unspecified: Secondary | ICD-10-CM

## 2019-02-28 NOTE — Assessment & Plan Note (Signed)
Constipation overall is doing well on Linzess 290 mcg daily as well as MiraLAX as needed for breakthrough.  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  No ongoing rectal bleeding.  Only one episode within the past year which was due to significant constipation due to missing Linzess.  Recommend she continue her current medications and bowel regimen.  Follow-up as needed.  Call for any worsening symptoms.

## 2019-02-28 NOTE — Patient Instructions (Signed)
Your health issues we discussed today were:   GERD (heartburn): 1. Continue taking your current medication (Protonix once a day). 2. Call us if you have any worsening or severe symptoms  Constipation: 1. Continue taking Linzess 290 mcg once a day and MiraLAX 1-2 times a day as needed 2. Call us if you have any significant bleeding 3. Call us if you have any worsening or severe symptoms  Overall I recommend:  1. Follow-up as needed 2. Call us if you have any questions or concerns    Because of recent events of COVID-19 ("Coronavirus"), follow CDC recommendations:  1. Wash your hand frequently 2. Avoid touching your face 3. Stay away from people who are sick 4. If you have symptoms such as fever, cough, shortness of breath then call your healthcare provider for further guidance 5. If you are sick, STAY AT HOME unless otherwise directed by your healthcare provider.    At [facility] we value your feedback. You may receive a survey about your visit today. Please share your experience as we strive to create trusting relationships with our patients to provide genuine, compassionate, quality care.  We appreciate your understanding and patience as we review any laboratory studies, imaging, and other diagnostic tests that are ordered as we care for you. Our office policy is 5 business days for review of these results, and any emergent or urgent results are addressed in a timely manner for your best interest. If you do not hear from our office in 1 week, please contact us.   We also encourage the use of MyChart, which contains your medical information for your review as well. If you are not enrolled in this feature, an access code is on this after visit summary for your convenience. Thank you for allowing [place] to be involved in your care.  It was great to see you today!  I hope you have a great day!!

## 2019-02-28 NOTE — Assessment & Plan Note (Signed)
GERD remains well managed on PPI.  Continue current medications and follow-up as needed.

## 2019-02-28 NOTE — Progress Notes (Signed)
Referring Provider: Sharilyn Sites, MD Primary Care Physician:  Sharilyn Sites, MD Primary GI:  Dr. Gala Romney  Chief Complaint  Patient presents with   Constipation    HPI:   Cheryl [name] is a 73 y.o. female who presents for follow-up on constipation.  The patient was last seen in our office 01/18/2018 for constipation and GERD.  Chronic history of hemorrhoids.  Was initially on Linzess 145 mcg but it was increased to 290 mcg and constipation improved.  Her last visit GERD was well controlled, single episode of breakthrough constipation the previous weekend.  States she cannot take Linzess if she is taken [facility] trip otherwise she still on the to 90 mcg dosing.  No other GI symptoms.  Recommend continue current medications, use MiraLAX 1-2 times a day as needed for breakthrough constipation and follow-up in 1 year.  Today she states she's doing well overall. 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 Occasionally uses MiraLAX to augment constipation treatment. Still some straining, had a "bad episode" of constipation a week ago which flared her hemorrhoids. Doesn't have any medications for the hemorrhoids. Discussed OTC hemorrhoid cream and call if stronger option (Anusol) is needed. Denies abdominal pain, N/V, regular hematochezia, melena, fever, chills, unintentional weight loss. GERD well managed on PPI. Denies chest pain, dyspnea, dizziness, lightheadedness, syncope, near syncope. Denies any other upper or lower GI symptoms.  Past Medical History:  Diagnosis Date   Acid reflux    Arthritis    Neuromuscular disorder ([place])    Sinus drainage    Sleep apnea    had surgery to correct    Past Surgical History:  Procedure Laterality Date   ANTERIOR CERVICAL DECOMP/DISCECTOMY FUSION N/A 11/19/2016   Procedure: ANTERIOR CERVICAL DISCECTOMY AND FUSION C5-6 WITH PLATES, SCREWS, CAGE, VIVIGEN II;  Surgeon: Jessy Oto, MD;   Location: [place];  Service: Orthopedics;  Laterality: N/A;   CARPAL TUNNEL RELEASE     CATARACT EXTRACTION, BILATERAL Bilateral 2017   One in December 2017 and one in January 2018   COLONOSCOPY  02/15/2005   FVC:BSWHQ small polyps ablated via cold biopsy, one from transverse colon and two from the rectum/Small external hemorrhoids   COLONOSCOPY  07/10/2010   PRF:[id number] rectum/long redundant colon, polyps in the sigmoid, descending, hepatic flexure/ADENOMATOUS POLYPS. next TCS due 06/2015   COLONOSCOPY  1995   3 polyps, path revealed chronic colitis   COLONOSCOPY N/A 08/05/2015   Procedure: COLONOSCOPY;  Surgeon: Daneil Dolin, MD;  [facility];  Service: Endoscopy;  Laterality: N/A;  1000   ESOPHAGOGASTRODUODENOSCOPY  1995   Gastritis   ESOPHAGOGASTRODUODENOSCOPY N/A 04/16/2013   RMR: HH   ESOPHAGOGASTRODUODENOSCOPY N/A 05/13/2016   Procedure: ESOPHAGOGASTRODUODENOSCOPY (EGD);  Surgeon: Daneil Dolin, MD;  [facility];  Service: Endoscopy;  Laterality: N/A;  145   ESOPHAGOGASTRODUODENOSCOPY N/A 08/18/2016   Procedure: ESOPHAGOGASTRODUODENOSCOPY (EGD);  Surgeon: Daneil Dolin, MD;  [facility];  Service: Endoscopy;  Laterality: N/A;  7:45 AM   KNEE SURGERY     x2   ROTATOR CUFF REPAIR     x2   SHOULDER ARTHROSCOPY WITH ROTATOR CUFF REPAIR AND SUBACROMIAL DECOMPRESSION Right 01/21/2015   Procedure: RIGHT SHOULDER ARTHROSCOPIC DEBRIDEMNT OF G-H JOINT AND REMOVAL OF LOOSE BODIES,ARTHROSCOPIC SUBACROMIAL DECOMPRESSION,MINI OPEN RCT REPAIR WITH SUPPLEMENTAL [facility] PATCH;  Surgeon: Garald Balding, MD;  Location: [place];  Service: Orthopedics;  Laterality: Right;  TONSILLECTOMY     TRIGGER FINGER RELEASE     UVULOPALATOPHARYNGOPLASTY      Current Outpatient Medications  Medication Sig Dispense Refill   acetaminophen (TYLENOL) 500 MG tablet Take 500-1,000 mg by mouth every 6 (six) hours as needed (for pain/headache).     albuterol  (PROVENTIL HFA;VENTOLIN HFA) 108 (90 BASE) MCG/ACT inhaler Inhale 1 puff into the lungs every 6 (six) hours as needed for wheezing or shortness of breath.     allopurinol (ZYLOPRIM) 300 MG tablet Take 300 mg by mouth at bedtime.     Biotin 5000 MCG TABS Take 5,000 mcg by mouth daily.     CALCIUM-VITAMIN D PO Take 1 tablet by mouth every morning.     diclofenac sodium (VOLTAREN) 1 % GEL Apply 2-4 g topically 4 (four) times daily. 5 Tube 3   febuxostat (ULORIC) 40 MG tablet Take 40 mg by mouth at bedtime.      finasteride (PROSCAR) 5 MG tablet Take 1 mg by mouth at bedtime.     fluticasone (FLONASE) 50 MCG/ACT nasal spray Place 1 spray into both nostrils daily as needed for allergies (sinuses).     LINZESS 290 MCG CAPS capsule TAKE 1 CAPSULE (290 MCG TOTAL) BY MOUTH DAILY BEFORE BREAKFAST. 90 capsule 3   montelukast (SINGULAIR) 10 MG tablet Take 10 mg by mouth every morning.     pantoprazole (PROTONIX) 40 MG tablet Take 1 tablet (40 mg total) by mouth daily. 30 tablet 11   polyethylene glycol (MIRALAX / GLYCOLAX) packet Take 17 g by mouth daily as needed.     raloxifene (EVISTA) 60 MG tablet Take 60 mg by mouth daily.     tolterodine (DETROL) 1 MG tablet Take 1 mg by mouth 2 (two) times daily.     vitamin B-12 (CYANOCOBALAMIN) 1000 MCG tablet Take 1,000 mcg by mouth daily.     Current Facility-Administered Medications  Medication Dose Route Frequency Provider Last Rate Last Dose   bupivacaine (MARCAINE) 0.5 % (with pres) injection 3 mL  3 mL Other Once Magnus Sinning, MD       bupivacaine (MARCAINE) 0.5 % (with pres) injection 3 mL  3 mL Other Once Magnus Sinning, MD        Allergies as of 02/28/2019 - Review Complete 02/28/2019  Allergen Reaction Noted   Prednisone Other (See Comments) 04/04/2013   Tramadol Nausea Only 01/21/2015   Aspirin Nausea And Vomiting    Celecoxib Rash     Family History  Problem Relation Age of Onset   Cancer Mother    Cancer Father     Diabetes Sister    Colon cancer Neg Hx    Liver disease Neg Hx     Social History   Socioeconomic History   Marital status: Married    Spouse name: Not on file   Number of children: 2   Years of education: Not on file   Highest education level: Not on file  Occupational History   Occupation: BUS DRIVER    Employer: [employer] [place] resource strain: Not on file   Food insecurity:    Worry: Not on file    Inability: Not on file   Transportation needs:    Medical: Not on file    Non-medical: Not on file  Tobacco Use   Smoking status: Former Smoker    Packs/day: 0.10    Years: 48.00    Pack years: 4.80    Types: Cigarettes  Last attempt to quit: 05/11/2005    Years since quitting: 13.8   Smokeless tobacco: Never Used  Substance and Sexual Activity   Alcohol use: No    Alcohol/week: 0.0 standard drinks   Drug use: No   Sexual activity: Not on file  Lifestyle   Physical activity:    Days per week: Not on file    Minutes per session: Not on file   Stress: Not on file  Relationships   Social connections:    Talks on phone: Not on file    Gets together: Not on file    Attends religious service: Not on file    Active member of club or organization: Not on file    Attends meetings of clubs or organizations: Not on file    Relationship status: Not on file  Other Topics Concern   Not on file  Social History Narrative   Not on file    Review of Systems: General: Negative for anorexia, weight loss, fever, chills, fatigue, weakness. Eyes: Negative for vision changes.  ENT: Negative for hoarseness, difficulty swallowing , nasal congestion. CV: Negative for chest pain, angina, palpitations, dyspnea on exertion, peripheral edema.  Respiratory: Negative for dyspnea at rest, dyspnea on exertion, cough, sputum, wheezing.  GI: See history of present illness. GU:  Negative for dysuria, hematuria, urinary incontinence,  urinary frequency, nocturnal urination.  MS: Negative for joint pain, low back pain.  Derm: Negative for rash or itching.  Neuro: Negative for weakness, abnormal sensation, seizure, frequent headaches, memory loss, confusion.  Psych: Negative for anxiety, depression, suicidal ideation, hallucinations.  Endo: Negative for unusual weight change.  Heme: Negative for bruising or bleeding. Allergy: Negative for rash or hives.   Physical Exam: BP 123/66    Pulse 86    Temp 99 F (37.2 C) (Oral)    Ht 4\' 11"  (1.499 m)    Wt 230 lb 6.4 oz (104.5 kg)    BMI 46.54 kg/m  General:   Alert and oriented. Pleasant and cooperative. Well-nourished and well-developed.  Head:  Normocephalic and atraumatic. Eyes:  Without icterus, sclera clear and conjunctiva pink.  Ears:  Normal auditory acuity. Mouth:  No deformity or lesions, oral mucosa pink.  Throat/Neck:  Supple, without mass or thyromegaly. Cardiovascular:  S1, S2 present without murmurs appreciated. Normal pulses noted. Extremities without clubbing or edema. Respiratory:  Clear to auscultation bilaterally. No wheezes, rales, or rhonchi. No distress.  Gastrointestinal:  +BS, soft, non-tender and non-distended. No HSM noted. No guarding or rebound. No masses appreciated.  Rectal:  Deferred  Musculoskalatal:  Symmetrical without gross deformities. Normal posture. Skin:  Intact without significant lesions or rashes. Neurologic:  Alert and oriented x4;  grossly normal neurologically. Psych:  Alert and cooperative. Normal mood and affect. Heme/Lymph/Immune: No significant cervical adenopathy. No excessive bruising noted.    02/28/2019 3:59 PM   Disclaimer: This note was dictated with voice recognition software. Similar sounding words can inadvertently be transcribed and may not be corrected upon review.

## 2019-03-01 NOTE — Progress Notes (Signed)
cc'ed to pcp °

## 2019-03-20 DIAGNOSIS — J069 Acute upper respiratory infection, unspecified: Secondary | ICD-10-CM | POA: Diagnosis not present

## 2019-03-20 DIAGNOSIS — J302 Other seasonal allergic rhinitis: Secondary | ICD-10-CM | POA: Diagnosis not present

## 2019-03-20 DIAGNOSIS — Z6841 Body Mass Index (BMI) 40.0 and over, adult: Secondary | ICD-10-CM | POA: Diagnosis not present

## 2019-05-10 DIAGNOSIS — E782 Mixed hyperlipidemia: Secondary | ICD-10-CM | POA: Diagnosis not present

## 2019-05-10 DIAGNOSIS — R7309 Other abnormal glucose: Secondary | ICD-10-CM | POA: Diagnosis not present

## 2019-05-10 DIAGNOSIS — E669 Obesity, unspecified: Secondary | ICD-10-CM | POA: Diagnosis not present

## 2019-06-11 ENCOUNTER — Ambulatory Visit: Payer: Medicare Other | Admitting: Internal Medicine

## 2019-06-13 ENCOUNTER — Other Ambulatory Visit: Payer: Self-pay

## 2019-06-13 ENCOUNTER — Ambulatory Visit (INDEPENDENT_AMBULATORY_CARE_PROVIDER_SITE_OTHER): Payer: Medicare Other | Admitting: Internal Medicine

## 2019-06-13 ENCOUNTER — Encounter: Payer: Self-pay | Admitting: Internal Medicine

## 2019-06-13 DIAGNOSIS — E063 Autoimmune thyroiditis: Secondary | ICD-10-CM

## 2019-06-13 NOTE — Patient Instructions (Signed)
Please stop Biotin for 5 days before our next appt.  Please return in 1 year.

## 2019-06-13 NOTE — Progress Notes (Addendum)
Patient ID: Cheryl [name], female   DOB: 1946/03/05, 73 y.o.   MRN: [id number]  Patient location: Home My location: Office  Referring Provider: Sharilyn Sites, MD  I connected with the patient on 06/13/19 at 10:11 AM EDT by telephone and verified that I am speaking with the correct person.   I discussed the limitations of evaluation and management by telephone and the availability of in person appointments. The patient expressed understanding and agreed to proceed.   Details of the encounter are shown below.  HPI  Cheryl [name] is a 73 y.o.-year-old female, initially referred by her PCP,  Dr. Micheline Rough, returning for f/u for euthyroid Hashimoto's thyroiditis. Last visit 1 year ago.  She has been diagnosed with euthyroid Hashimoto's thyroiditis in 10/2015.   I reviewed reviewed patient's TFTs: Lab Results  Component Value Date   TSH 2.09 06/09/2018   TSH 1.54 02/25/2017   TSH 1.69 11/02/2016   TSH 2.82 02/27/2016   FREET4 0.85 06/09/2018   FREET4 0.85 02/25/2017   FREET4 1.08 11/02/2016   FREET4 1.03 02/27/2016   T3FREE 3.9 06/09/2018   T3FREE 3.6 02/25/2017   T3FREE 3.9 11/02/2016   T3FREE 4.2 02/27/2016   Latest at TPO antibodies have improved: Component     Latest Ref Rng & Units 06/09/2018  Thyroperoxidase Ab SerPl-aCnc     <9 IU/mL 109 (H)   10/21/2015:  - TSH 1.020, free T3 2.0 (2-4.4) - TPO antibodies 159 (0-34)  She continues to describe: arthritis pain, sinus congestion, fatigue - slight (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  Pt denies: - feeling nodules in neck - hoarseness - dysphagia - choking - SOB with lying down  She has + FH of thyroid disorders in: sisters, mother. No FH of thyroid cancer. No h/o radiation tx to head or neck.  No  seaweed or kelp. No recent contrast studies. No herbal supplements. + Biotin use. No recent steroids use.   She also has a history of OSA >> had surgery for it, osteopenia  (on Raloxifene), gout.  ROS: Constitutional: see HPI Eyes: no blurry vision, no xerophthalmia ENT: no sore throat, + see HPI Cardiovascular: no CP/no SOB/no palpitations/no leg swelling Respiratory: no cough/no SOB/no wheezing Gastrointestinal: no N/no V/no D/+ C/no acid reflux Musculoskeletal: + muscle aches/+ joint aches Skin: no rashes, + hair loss Neurological: no tremors/no numbness/no tingling/no dizziness  I reviewed pt's medications, allergies, PMH, social hx, family hx, and changes were documented in the history of present illness. Otherwise, unchanged from my initial visit note.  Past Medical History:  Diagnosis Date  . Acid reflux   . Arthritis   . Neuromuscular disorder ([place])   . Sinus drainage   . Sleep apnea    had surgery to correct   Past Surgical History:  Procedure Laterality Date  . ANTERIOR CERVICAL DECOMP/DISCECTOMY FUSION N/A 11/19/2016   Procedure: ANTERIOR CERVICAL DISCECTOMY AND FUSION C5-6 WITH PLATES, SCREWS, CAGE, VIVIGEN II;  Surgeon: Jessy Oto, MD;  Location: [place];  Service: Orthopedics;  Laterality: N/A;  . CARPAL TUNNEL RELEASE    . CATARACT EXTRACTION, BILATERAL Bilateral 2017   One in December 2017 and one in January 2018  . COLONOSCOPY  02/15/2005   JAS:NKNLZ small polyps ablated via cold biopsy, one from transverse colon and two  from the rectum/Small external hemorrhoids  . COLONOSCOPY  07/10/2010   WOE:HOZYYQ rectum/long redundant colon, polyps in the sigmoid, descending, hepatic flexure/ADENOMATOUS POLYPS. next TCS due 06/2015  . COLONOSCOPY  1995   3 polyps, path revealed chronic colitis  . COLONOSCOPY N/A 08/05/2015   Procedure: COLONOSCOPY;  Surgeon: Daneil Dolin, MD;  [facility];  Service: Endoscopy;  Laterality: N/A;  1000  .  ESOPHAGOGASTRODUODENOSCOPY  1995   Gastritis  . ESOPHAGOGASTRODUODENOSCOPY N/A 04/16/2013   RMR: HH  . ESOPHAGOGASTRODUODENOSCOPY N/A 05/13/2016   Procedure: ESOPHAGOGASTRODUODENOSCOPY (EGD);  Surgeon: Daneil Dolin, MD;  [facility];  Service: Endoscopy;  Laterality: N/A;  145  . ESOPHAGOGASTRODUODENOSCOPY N/A 08/18/2016   Procedure: ESOPHAGOGASTRODUODENOSCOPY (EGD);  Surgeon: Daneil Dolin, MD;  [facility];  Service: Endoscopy;  Laterality: N/A;  7:45 AM  . KNEE SURGERY     x2  . ROTATOR CUFF REPAIR     x2  . SHOULDER ARTHROSCOPY WITH ROTATOR CUFF REPAIR AND SUBACROMIAL DECOMPRESSION Right 01/21/2015   Procedure: RIGHT SHOULDER ARTHROSCOPIC DEBRIDEMNT OF G-H JOINT AND REMOVAL OF LOOSE BODIES,ARTHROSCOPIC SUBACROMIAL DECOMPRESSION,MINI OPEN RCT REPAIR WITH SUPPLEMENTAL [facility] PATCH;  Surgeon: Garald Balding, MD;  Location: [place];  Service: Orthopedics;  Laterality: Right;  . TONSILLECTOMY    . TRIGGER FINGER RELEASE    . UVULOPALATOPHARYNGOPLASTY     Social History   Social History  . Marital Status: Married    Spouse Name: N/A  . Number of Children: 2   Occupational History  . BUS DRIVER - retired    Social History Main Topics  . Smoking status: Former Smoker -- 48 years    Types: Cigarettes  . Smokeless tobacco: Former Systems developer    Quit date: 12/20/2004  . Alcohol Use: No  . Drug Use: No   Current Outpatient Medications on File Prior to Visit  Medication Sig Dispense Refill  . acetaminophen (TYLENOL) 500 MG tablet Take 500-1,000 mg by mouth every 6 (six) hours as needed (for pain/headache).    Marland Kitchen albuterol (PROVENTIL HFA;VENTOLIN HFA) 108 (90 BASE) MCG/ACT inhaler Inhale 1 puff into the lungs every 6 (six) hours as needed for wheezing or shortness of breath.    . allopurinol (ZYLOPRIM) 300 MG tablet Take 300 mg by mouth at bedtime.    . Biotin 5000 MCG TABS Take 5,000 mcg by mouth daily.    Marland Kitchen CALCIUM-VITAMIN D PO Take 1 tablet by mouth every morning.     . diclofenac sodium (VOLTAREN) 1 % GEL Apply 2-4 g topically 4 (four) times daily. 5 Tube 3  . febuxostat (ULORIC) 40 MG tablet Take 40 mg by mouth at bedtime.     . finasteride (PROSCAR) 5 MG tablet Take 1 mg by mouth at bedtime.    . fluticasone (FLONASE) 50 MCG/ACT nasal spray Place 1 spray into both nostrils daily as needed for allergies (sinuses).    Marland Kitchen LINZESS 290 MCG CAPS capsule TAKE 1 CAPSULE (290 MCG TOTAL) BY MOUTH DAILY BEFORE BREAKFAST. 90 capsule 3  . montelukast (SINGULAIR) 10 MG tablet Take 10 mg by mouth every morning.    . pantoprazole (PROTONIX) 40 MG tablet Take 1 tablet (40 mg total) by mouth daily. 30 tablet 11  . polyethylene glycol (MIRALAX / GLYCOLAX) packet Take 17 g by mouth daily as needed.    . raloxifene (EVISTA) 60 MG tablet Take 60 mg by mouth daily.    Marland Kitchen tolterodine (DETROL) 1 MG tablet Take 1 mg by  mouth 2 (two) times daily.    . vitamin B-12 (CYANOCOBALAMIN) 1000 MCG tablet Take 1,000 mcg by mouth daily.     Current Facility-Administered Medications on File Prior to Visit  Medication Dose Route Frequency Provider Last Rate Last Dose  . bupivacaine (MARCAINE) 0.5 % (with pres) injection 3 mL  3 mL Other Once Magnus Sinning, MD      . bupivacaine (MARCAINE) 0.5 % (with pres) injection 3 mL  3 mL Other Once Magnus Sinning, MD       Allergies  Allergen Reactions  . Prednisone Other (See Comments)    Hallucinations   . Tramadol Nausea Only  . Aspirin Nausea And Vomiting  . Celecoxib Rash   Family History  Problem Relation Age of Onset  . Cancer Mother   . Cancer Father   . Diabetes Sister   . Colon cancer Neg Hx   . Liver disease Neg Hx    PE: There were no vitals taken for this visit. Wt Readings from Last 3 Encounters:  02/28/19 230 lb 6.4 oz (104.5 kg)  01/02/19 230 lb (104.3 kg)  06/09/18 230 lb (104.3 kg)   Constitutional:  in NAD  The physical exam was not performed (telephone visit).  ASSESSMENT: 1. Hashimoto  thyroiditis  PLAN: 1. Hashimoto thyroiditis -Patient has a history of euthyroid Hashimoto's thyroiditis, for which she does not require levothyroxine treatment -Reviewed latest TSH, which was normal a year ago: Lab Results  Component Value Date   TSH 2.09 06/09/2018  -At last visit, I suggested selenium to decrease her TPO antibodies, but she did not try this. -At this visit, she tells me her fatigue has decreased significantly, and she still has hair loss, but improved.  Her hot flashes are also much better, minimal.  Her weight is stable now (she lost 30 pounds before last visit after starting Buproprion 300 mg -she continues this). -She continues on biotin, which we discussed that she will need to be off for at least 5 days to avoid interaction with the TSH assay.   -I advised her to come to the clinic for a new set of TFTs whenever safe (now coronavirus pandemic) -We also discussed about how to take levothyroxine in case we need to start: every day, with water, at least 30 minutes before breakfast, separated by at least 4 hours from: - acid reflux medications - calcium - iron - multivitamins  Orders Placed This Encounter  Procedures  . TSH  . T4, free  . T3, free   CC: Dr Ronita Hipps  - time spent with the patient: 12 minutes, of which >50% was spent in obtaining information about her symptoms, reviewing her previous labs, evaluations, and treatments, counseling her about her condition (please see the discussed topics above), and developing a plan to further investigate and treat it.  Lab Results  Component Value Date   TSH 1.25 06/14/2019   Normal TFTs.  Philemon Kingdom, MD PhD [facility]

## 2019-06-14 ENCOUNTER — Other Ambulatory Visit: Payer: Self-pay

## 2019-06-14 ENCOUNTER — Other Ambulatory Visit (INDEPENDENT_AMBULATORY_CARE_PROVIDER_SITE_OTHER): Payer: Medicare Other

## 2019-06-14 DIAGNOSIS — E7849 Other hyperlipidemia: Secondary | ICD-10-CM | POA: Diagnosis not present

## 2019-06-14 DIAGNOSIS — E063 Autoimmune thyroiditis: Secondary | ICD-10-CM | POA: Diagnosis not present

## 2019-06-14 DIAGNOSIS — R7309 Other abnormal glucose: Secondary | ICD-10-CM | POA: Diagnosis not present

## 2019-06-14 DIAGNOSIS — J302 Other seasonal allergic rhinitis: Secondary | ICD-10-CM | POA: Diagnosis not present

## 2019-06-14 DIAGNOSIS — Z6841 Body Mass Index (BMI) 40.0 and over, adult: Secondary | ICD-10-CM | POA: Diagnosis not present

## 2019-06-14 DIAGNOSIS — Z0001 Encounter for general adult medical examination with abnormal findings: Secondary | ICD-10-CM | POA: Diagnosis not present

## 2019-06-14 DIAGNOSIS — Z1389 Encounter for screening for other disorder: Secondary | ICD-10-CM | POA: Diagnosis not present

## 2019-06-14 LAB — TSH: TSH: 1.25 u[IU]/mL (ref 0.35–4.50)

## 2019-06-14 LAB — T4, FREE: Free T4: 0.84 ng/dL (ref 0.60–1.60)

## 2019-06-14 LAB — T3, FREE: T3, Free: 3.4 pg/mL (ref 2.3–4.2)

## 2019-06-17 DIAGNOSIS — Z20828 Contact with and (suspected) exposure to other viral communicable diseases: Secondary | ICD-10-CM | POA: Diagnosis not present

## 2019-07-10 NOTE — Addendum Note (Signed)
Addended by: Philemon Kingdom on: 07/10/2019 02:14 PM   Modules accepted: Level of Service

## 2019-08-23 ENCOUNTER — Other Ambulatory Visit (HOSPITAL_COMMUNITY): Payer: Self-pay | Admitting: Family Medicine

## 2019-08-23 ENCOUNTER — Other Ambulatory Visit: Payer: Self-pay

## 2019-08-23 ENCOUNTER — Ambulatory Visit (HOSPITAL_COMMUNITY)
Admission: RE | Admit: 2019-08-23 | Discharge: 2019-08-23 | Disposition: A | Discharge status: Home / Self Care | Payer: Medicare Other | Source: Ambulatory Visit | Attending: Family Medicine | Admitting: Family Medicine

## 2019-08-23 DIAGNOSIS — M79671 Pain in right foot: Secondary | ICD-10-CM

## 2019-08-23 DIAGNOSIS — Z6841 Body Mass Index (BMI) 40.0 and over, adult: Secondary | ICD-10-CM | POA: Diagnosis not present

## 2019-10-16 DIAGNOSIS — Z23 Encounter for immunization: Secondary | ICD-10-CM | POA: Diagnosis not present

## 2019-10-29 ENCOUNTER — Telehealth: Payer: Self-pay | Admitting: Internal Medicine

## 2019-10-29 NOTE — Telephone Encounter (Signed)
Pt is taking Linzess 290 mcg and last ov was 6 months ago. Pt would like a refill of Linzess 290 mcg.

## 2019-10-29 NOTE — Telephone Encounter (Signed)
PATIENT CALLED AND SAID THAT HER PHARMACY WILL BE SENDING A REFILL REQUEST FOR HER LINZESS AGAIN.  THEY SENT ONE LAST WEEK AND HAVE NOT HEARD BACK FROM [place]

## 2019-10-30 ENCOUNTER — Other Ambulatory Visit: Payer: Self-pay | Admitting: Gastroenterology

## 2019-10-30 DIAGNOSIS — K5900 Constipation, unspecified: Secondary | ICD-10-CM

## 2019-10-30 MED ORDER — LINACLOTIDE 290 MCG PO CAPS: 290.0000 ug | ORAL_CAPSULE | Freq: Every day | ORAL | 3 refills | Status: DC

## 2019-10-30 NOTE — Telephone Encounter (Signed)
Rx sent 

## 2019-10-30 NOTE — Telephone Encounter (Signed)
Noted. Pt notified that her RX has been sent into her pharmacy.

## 2019-11-02 DIAGNOSIS — Z6841 Body Mass Index (BMI) 40.0 and over, adult: Secondary | ICD-10-CM | POA: Diagnosis not present

## 2019-11-02 DIAGNOSIS — G2581 Restless legs syndrome: Secondary | ICD-10-CM | POA: Diagnosis not present

## 2020-01-14 DIAGNOSIS — Z01419 Encounter for gynecological examination (general) (routine) without abnormal findings: Secondary | ICD-10-CM | POA: Diagnosis not present

## 2020-01-14 DIAGNOSIS — L900 Lichen sclerosus et atrophicus: Secondary | ICD-10-CM | POA: Diagnosis not present

## 2020-01-14 DIAGNOSIS — M810 Age-related osteoporosis without current pathological fracture: Secondary | ICD-10-CM | POA: Diagnosis not present

## 2020-01-14 DIAGNOSIS — Z779 Other contact with and (suspected) exposures hazardous to health: Secondary | ICD-10-CM | POA: Diagnosis not present

## 2020-01-14 DIAGNOSIS — Z1231 Encounter for screening mammogram for malignant neoplasm of breast: Secondary | ICD-10-CM | POA: Diagnosis not present

## 2020-01-28 ENCOUNTER — Other Ambulatory Visit: Payer: Self-pay

## 2020-01-28 ENCOUNTER — Ambulatory Visit: Payer: Medicare Other | Attending: Internal Medicine

## 2020-01-28 DIAGNOSIS — Z20822 Contact with and (suspected) exposure to covid-19: Secondary | ICD-10-CM

## 2020-01-29 LAB — NOVEL CORONAVIRUS, NAA: SARS-CoV-2, NAA: NOT DETECTED

## 2020-02-01 ENCOUNTER — Ambulatory Visit: Payer: Medicare Other | Attending: Internal Medicine

## 2020-02-01 ENCOUNTER — Other Ambulatory Visit: Payer: Self-pay

## 2020-02-01 DIAGNOSIS — Z23 Encounter for immunization: Secondary | ICD-10-CM | POA: Insufficient documentation

## 2020-02-01 NOTE — Progress Notes (Signed)
   Covid-19 Vaccination Clinic  Name:  KEYARRA RENDALL    MRN: [id number] DOB: 02/28/1946  02/01/2020  Ms. Nelon was observed post Covid-19 immunization for 15 minutes without incidence. She was provided with Vaccine Information Sheet and instruction to access the V-Safe system.   Ms. Winstead was instructed to call 911 with any severe reactions post vaccine: Marland Kitchen Difficulty breathing  . Swelling of your face and throat  . A fast heartbeat  . A bad rash all over your body  . Dizziness and weakness    Immunizations Administered    Name Date Dose VIS Date Route   Moderna COVID-19 Vaccine 02/01/2020 11:07 AM 0.5 mL 11/20/2019 Intramuscular   Manufacturer: Moderna   Lot: [id number]   [place]: [id number]

## 2020-02-12 DIAGNOSIS — Z681 Body mass index (BMI) 19 or less, adult: Secondary | ICD-10-CM | POA: Diagnosis not present

## 2020-02-12 DIAGNOSIS — J22 Unspecified acute lower respiratory infection: Secondary | ICD-10-CM | POA: Diagnosis not present

## 2020-02-12 DIAGNOSIS — R0602 Shortness of breath: Secondary | ICD-10-CM | POA: Diagnosis not present

## 2020-02-29 DIAGNOSIS — R7309 Other abnormal glucose: Secondary | ICD-10-CM | POA: Diagnosis not present

## 2020-02-29 DIAGNOSIS — M545 Low back pain: Secondary | ICD-10-CM | POA: Diagnosis not present

## 2020-02-29 DIAGNOSIS — R05 Cough: Secondary | ICD-10-CM | POA: Diagnosis not present

## 2020-02-29 DIAGNOSIS — Z681 Body mass index (BMI) 19 or less, adult: Secondary | ICD-10-CM | POA: Diagnosis not present

## 2020-02-29 DIAGNOSIS — M549 Dorsalgia, unspecified: Secondary | ICD-10-CM | POA: Diagnosis not present

## 2020-02-29 DIAGNOSIS — E7849 Other hyperlipidemia: Secondary | ICD-10-CM | POA: Diagnosis not present

## 2020-02-29 DIAGNOSIS — K219 Gastro-esophageal reflux disease without esophagitis: Secondary | ICD-10-CM | POA: Diagnosis not present

## 2020-02-29 DIAGNOSIS — R300 Dysuria: Secondary | ICD-10-CM | POA: Diagnosis not present

## 2020-03-03 ENCOUNTER — Ambulatory Visit: Payer: Medicare Other

## 2020-03-27 ENCOUNTER — Encounter (HOSPITAL_COMMUNITY): Payer: Self-pay | Admitting: *Deleted

## 2020-03-27 ENCOUNTER — Other Ambulatory Visit: Payer: Self-pay

## 2020-03-27 ENCOUNTER — Inpatient Hospital Stay (HOSPITAL_COMMUNITY)
Admission: EM | Admit: 2020-03-27 | Discharge: 2020-03-29 | DRG: 193 | Disposition: A | Discharge status: Home / Self Care | Payer: Medicare Other | Source: Ambulatory Visit | Attending: Family Medicine | Admitting: Family Medicine

## 2020-03-27 ENCOUNTER — Emergency Department (HOSPITAL_COMMUNITY): Payer: Medicare Other

## 2020-03-27 DIAGNOSIS — Z6841 Body Mass Index (BMI) 40.0 and over, adult: Secondary | ICD-10-CM

## 2020-03-27 DIAGNOSIS — E860 Dehydration: Secondary | ICD-10-CM | POA: Diagnosis present

## 2020-03-27 DIAGNOSIS — Z79899 Other long term (current) drug therapy: Secondary | ICD-10-CM

## 2020-03-27 DIAGNOSIS — Z8719 Personal history of other diseases of the digestive system: Secondary | ICD-10-CM

## 2020-03-27 DIAGNOSIS — K760 Fatty (change of) liver, not elsewhere classified: Secondary | ICD-10-CM | POA: Diagnosis present

## 2020-03-27 DIAGNOSIS — Z888 Allergy status to other drugs, medicaments and biological substances status: Secondary | ICD-10-CM

## 2020-03-27 DIAGNOSIS — Z20822 Contact with and (suspected) exposure to covid-19: Secondary | ICD-10-CM | POA: Diagnosis present

## 2020-03-27 DIAGNOSIS — J9601 Acute respiratory failure with hypoxia: Secondary | ICD-10-CM

## 2020-03-27 DIAGNOSIS — N179 Acute kidney failure, unspecified: Secondary | ICD-10-CM | POA: Diagnosis not present

## 2020-03-27 DIAGNOSIS — J189 Pneumonia, unspecified organism: Secondary | ICD-10-CM | POA: Diagnosis not present

## 2020-03-27 DIAGNOSIS — Z87891 Personal history of nicotine dependence: Secondary | ICD-10-CM

## 2020-03-27 DIAGNOSIS — Z833 Family history of diabetes mellitus: Secondary | ICD-10-CM

## 2020-03-27 DIAGNOSIS — J069 Acute upper respiratory infection, unspecified: Secondary | ICD-10-CM | POA: Diagnosis not present

## 2020-03-27 DIAGNOSIS — K219 Gastro-esophageal reflux disease without esophagitis: Secondary | ICD-10-CM | POA: Diagnosis present

## 2020-03-27 DIAGNOSIS — R079 Chest pain, unspecified: Secondary | ICD-10-CM | POA: Diagnosis not present

## 2020-03-27 DIAGNOSIS — Z8639 Personal history of other endocrine, nutritional and metabolic disease: Secondary | ICD-10-CM

## 2020-03-27 DIAGNOSIS — Z981 Arthrodesis status: Secondary | ICD-10-CM | POA: Diagnosis not present

## 2020-03-27 DIAGNOSIS — Z886 Allergy status to analgesic agent status: Secondary | ICD-10-CM

## 2020-03-27 DIAGNOSIS — Z681 Body mass index (BMI) 19 or less, adult: Secondary | ICD-10-CM | POA: Diagnosis not present

## 2020-03-27 LAB — TROPONIN I (HIGH SENSITIVITY)
Troponin I (High Sensitivity): 5 ng/L (ref <18)
Troponin I (High Sensitivity): 6 ng/L (ref <18)

## 2020-03-27 LAB — BASIC METABOLIC PANEL
Anion gap: 11 (ref 5–15)
BUN: 21 mg/dL (ref 8–23)
CO2: 22 mmol/L (ref 22–32)
Calcium: 9.2 mg/dL (ref 8.9–10.3)
Chloride: 105 mmol/L (ref 98–111)
Creatinine, Ser: 1.52 mg/dL — ABNORMAL HIGH (ref 0.44–1.00)
GFR calc Af Amer: 40 mL/min — ABNORMAL LOW (ref >60)
GFR calc non Af Amer: 34 mL/min — ABNORMAL LOW (ref >60)
Glucose, Bld: 107 mg/dL — ABNORMAL HIGH (ref 70–99)
Potassium: 4 mmol/L (ref 3.5–5.1)
Sodium: 138 mmol/L (ref 135–145)

## 2020-03-27 LAB — CBC
HCT: 37.8 % (ref 36.0–46.0)
Hemoglobin: 11.3 g/dL — ABNORMAL LOW (ref 12.0–15.0)
MCH: 29.4 pg (ref 26.0–34.0)
MCHC: 29.9 g/dL — ABNORMAL LOW (ref 30.0–36.0)
MCV: 98.2 fL (ref 80.0–100.0)
Platelets: 493 10*3/uL — ABNORMAL HIGH (ref 150–400)
RBC: 3.85 MIL/uL — ABNORMAL LOW (ref 3.87–5.11)
RDW: 14.7 % (ref 11.5–15.5)
WBC: 9.3 10*3/uL (ref 4.0–10.5)
nRBC: 0 % (ref 0.0–0.2)

## 2020-03-27 LAB — RESPIRATORY PANEL BY RT PCR (FLU A&B, COVID)
Influenza A by PCR: NEGATIVE
Influenza B by PCR: NEGATIVE
SARS Coronavirus 2 by RT PCR: NEGATIVE

## 2020-03-27 LAB — POC SARS CORONAVIRUS 2 AG -  ED: SARS Coronavirus 2 Ag: NEGATIVE

## 2020-03-27 MED ORDER — ACETAMINOPHEN 325 MG PO TABS
650.0000 mg | ORAL_TABLET | Freq: Four times a day (QID) | ORAL | Status: DC | PRN
  Administered 2020-03-28 – 2020-03-29 (×2): 650 mg via ORAL
  Filled 2020-03-27 (×2): qty 2

## 2020-03-27 MED ORDER — BENZONATATE 100 MG PO CAPS: 100.0000 mg | ORAL_CAPSULE | Freq: Three times a day (TID) | ORAL | Status: DC | PRN

## 2020-03-27 MED ORDER — ALBUTEROL SULFATE (2.5 MG/3ML) 0.083% IN NEBU: 2.5000 mg | INHALATION_SOLUTION | RESPIRATORY_TRACT | Status: DC | PRN

## 2020-03-27 MED ORDER — AZITHROMYCIN 250 MG PO TABS
500.0000 mg | ORAL_TABLET | Freq: Once | ORAL | Status: AC
  Administered 2020-03-27: 500 mg via ORAL
  Filled 2020-03-27: qty 2

## 2020-03-27 MED ORDER — ONDANSETRON HCL 4 MG/2ML IJ SOLN: 4.0000 mg | Freq: Four times a day (QID) | INTRAMUSCULAR | Status: DC | PRN

## 2020-03-27 MED ORDER — ACETAMINOPHEN 650 MG RE SUPP: 650.0000 mg | Freq: Four times a day (QID) | RECTAL | Status: DC | PRN

## 2020-03-27 MED ORDER — HEPARIN SODIUM (PORCINE) 5000 UNIT/ML IJ SOLN
5000.0000 [IU] | Freq: Three times a day (TID) | INTRAMUSCULAR | Status: DC
  Administered 2020-03-27 – 2020-03-29 (×5): 5000 [IU] via SUBCUTANEOUS
  Filled 2020-03-27 (×5): qty 1

## 2020-03-27 MED ORDER — ONDANSETRON HCL 4 MG PO TABS: 4.0000 mg | ORAL_TABLET | Freq: Four times a day (QID) | ORAL | Status: DC | PRN

## 2020-03-27 MED ORDER — SODIUM CHLORIDE 0.9 % IV SOLN
1.0000 g | Freq: Once | INTRAVENOUS | Status: AC
  Administered 2020-03-27: 1 g via INTRAVENOUS
  Filled 2020-03-27: qty 10

## 2020-03-27 NOTE — ED Triage Notes (Signed)
Pt with right sided chest pain and congestion. Sent here from [facility].

## 2020-03-27 NOTE — ED Notes (Signed)
Pt's O2 sats remained between 89-98% while ambulating.  Pt denies any SOB while ambulating.

## 2020-03-27 NOTE — ED Provider Notes (Signed)
[facility] EMERGENCY DEPARTMENT Provider Note   CSN: [id number] Arrival date & time: 03/27/20  1420     History Chief Complaint  Patient presents with  . Chest Pain    Cheryl [name] is a 74 y.o. female who presents to the emergency department with chief complaint of painful cough.  Patient complains of pain with coughing on the right side for the past 5 days.  She denies any shortness of breath.  She has been using a cough medication at home which has been helpful.  She apparently was in touch with her primary care physician who sent her to the emergency department for further evaluation.  She denies body aches, chills, fever.  She has no known Covid exposure.  The patient states that she got 1 Covid vaccine back in February that "basically shut my whole immune system down."  By that she means she felt fatigued for about 2 weeks.  The patient says that her primary care doctor told her not to get the second 1.  She says that since that time she has had intermittent cough that has come back and gotten much worse this week.  HPI     Past Medical History:  Diagnosis Date  . Acid reflux   . Arthritis   . Neuromuscular disorder ([place])   . Sinus drainage   . Sleep apnea    had surgery to correct    Patient Active Problem List   Diagnosis Date Noted  . Pneumonia 03/27/2020  . Herniated cervical intervertebral disc 11/19/2016  . Cervical disc disorder at C5-C6 level with radiculopathy 10/21/2016  . Gastric ulceration   . GERD (gastroesophageal reflux disease) 05/11/2016  . Constipation 05/11/2016  . Hashimoto's thyroiditis 02/20/2016  . History of colonic polyps   . Diverticulosis of colon without hemorrhage   . Complete tear of right rotator cuff 01/21/2015  . Severe obesity (BMI >= 40) ([place]) 01/21/2015  . Nausea without vomiting 04/04/2013  . H/O adenomatous polyp of colon 04/04/2013  . Fatty liver 11/11/2010  . RUQ PAIN 11/11/2010  . NONSPEC ELEVATION OF LEVELS OF  TRANSAMINASE/LDH 11/11/2010  . COLONIC POLYPS, HYPERPLASTIC, HX OF 06/25/2010  . ANEMIA-NOS 06/24/2010    Past Surgical History:  Procedure Laterality Date  . ANTERIOR CERVICAL DECOMP/DISCECTOMY FUSION N/A 11/19/2016   Procedure: ANTERIOR CERVICAL DISCECTOMY AND FUSION C5-6 WITH PLATES, SCREWS, CAGE, VIVIGEN II;  Surgeon: Jessy Oto, MD;  Location: [place];  Service: Orthopedics;  Laterality: N/A;  . CARPAL TUNNEL RELEASE    . CATARACT EXTRACTION, BILATERAL Bilateral 2017   One in December 2017 and one in January 2018  . COLONOSCOPY  02/15/2005   ZYS:AYTKZ small polyps ablated via cold biopsy, one from transverse colon and two from the rectum/Small external hemorrhoids  . COLONOSCOPY  07/10/2010   SWF:UXNATF rectum/long redundant colon, polyps in the sigmoid, descending, hepatic flexure/ADENOMATOUS POLYPS. next TCS due 06/2015  . COLONOSCOPY  1995   3 polyps, path revealed chronic colitis  . COLONOSCOPY N/A 08/05/2015   Procedure: COLONOSCOPY;  Surgeon: Daneil Dolin, MD;  [facility];  Service: Endoscopy;  Laterality: N/A;  1000  . ESOPHAGOGASTRODUODENOSCOPY  1995   Gastritis  . ESOPHAGOGASTRODUODENOSCOPY N/A 04/16/2013   RMR: HH  . ESOPHAGOGASTRODUODENOSCOPY N/A 05/13/2016   Procedure: ESOPHAGOGASTRODUODENOSCOPY (EGD);  Surgeon: Daneil Dolin, MD;  [facility];  Service: Endoscopy;  Laterality: N/A;  145  . ESOPHAGOGASTRODUODENOSCOPY N/A 08/18/2016   Procedure: ESOPHAGOGASTRODUODENOSCOPY (EGD);  Surgeon: Daneil Dolin,  MD;  [facility];  Service: Endoscopy;  Laterality: N/A;  7:45 AM  . KNEE SURGERY     x2  . ROTATOR CUFF REPAIR     x2  . SHOULDER ARTHROSCOPY WITH ROTATOR CUFF REPAIR AND SUBACROMIAL DECOMPRESSION Right 01/21/2015   Procedure: RIGHT SHOULDER ARTHROSCOPIC DEBRIDEMNT OF G-H JOINT AND REMOVAL OF LOOSE BODIES,ARTHROSCOPIC SUBACROMIAL DECOMPRESSION,MINI OPEN RCT REPAIR WITH SUPPLEMENTAL [facility] PATCH;  Surgeon: Garald Balding, MD;   Location: [place];  Service: Orthopedics;  Laterality: Right;  . TONSILLECTOMY    . TRIGGER FINGER RELEASE    . UVULOPALATOPHARYNGOPLASTY       OB History   No obstetric history on file.     Family History  Problem Relation Age of Onset  . Cancer Mother   . Cancer Father   . Diabetes Sister   . Colon cancer Neg Hx   . Liver disease Neg Hx     Social History   Tobacco Use  . Smoking status: Former Smoker    Packs/day: 0.10    Years: 48.00    Pack years: 4.80    Types: Cigarettes    Quit date: 05/11/2005    Years since quitting: 14.8  . Smokeless tobacco: Never Used  Substance Use Topics  . Alcohol use: No    Alcohol/week: 0.0 standard drinks  . Drug use: No    Home Medications Prior to Admission medications   Medication Sig Start Date End Date Taking? Authorizing Provider  acetaminophen (TYLENOL) 500 MG tablet Take 500-1,000 mg by mouth every 6 (six) hours as needed (for pain/headache).    [provider]  albuterol (PROVENTIL HFA;VENTOLIN HFA) 108 (90 BASE) MCG/ACT inhaler Inhale 1 puff into the lungs every 6 (six) hours as needed for wheezing or shortness of breath.    [provider]  allopurinol (ZYLOPRIM) 300 MG tablet Take 300 mg by mouth at bedtime.    [provider]  Biotin 5000 MCG TABS Take 5,000 mcg by mouth daily.    [provider]  buPROPion (WELLBUTRIN XL) 300 MG 24 hr tablet Take 300 mg by mouth daily. 05/15/19   [provider]  CALCIUM-VITAMIN D PO Take 1 tablet by mouth every morning.    [provider]  diclofenac sodium (VOLTAREN) 1 % GEL Apply 2-4 g topically 4 (four) times daily. 08/02/17   Cherylann Ratel, PA-C  febuxostat (ULORIC) 40 MG tablet Take 40 mg by mouth at bedtime.     [provider]  finasteride (PROSCAR) 5 MG tablet Take 1 mg by mouth at bedtime.    [provider]  fluticasone (FLONASE) 50 MCG/ACT nasal spray Place 1 spray into both nostrils daily as needed for  allergies (sinuses).    [provider]  linaclotide Rolan Lipa) 290 MCG CAPS capsule Take 1 capsule (290 mcg total) by mouth daily before breakfast. 10/30/19   Erenest Rasher, PA-C  montelukast (SINGULAIR) 10 MG tablet Take 10 mg by mouth every morning.    [provider]  pantoprazole (PROTONIX) 40 MG tablet Take 1 tablet (40 mg total) by mouth daily. 02/18/17   Philemon Kingdom, MD  polyethylene glycol (MIRALAX / GLYCOLAX) packet Take 17 g by mouth daily as needed.    [provider]  raloxifene (EVISTA) 60 MG tablet Take 60 mg by mouth daily.    [provider]  tolterodine (DETROL) 1 MG tablet Take 1 mg by mouth 2 (two) times daily.    [provider]  vitamin B-12 (CYANOCOBALAMIN) 1000 MCG tablet Take 1,000 mcg by mouth daily.    [provider]    Allergies    Prednisone, Tramadol, Aspirin, and Celecoxib  Review of Systems   Review of Systems Ten systems reviewed and are negative for acute change, except as noted in the HPI.   Physical Exam Updated Vital Signs BP 121/79   Pulse 96   Temp 99.1 F (37.3 C) (Oral)   Resp (!) 22   Ht 4\' 11"  (1.499 m)   Wt 95.3 kg   SpO2 93%   BMI 42.41 kg/m   Physical Exam Vitals and nursing note reviewed.  Constitutional:      General: She is not in acute distress.    Appearance: She is well-developed. She is not diaphoretic.  HENT:     Head: Normocephalic and atraumatic.  Eyes:     General: No scleral icterus.    Conjunctiva/sclera: Conjunctivae normal.  Cardiovascular:     Rate and Rhythm: Normal rate and regular rhythm.     Heart sounds: Normal heart sounds. No murmur. No friction rub. No gallop.   Pulmonary:     Effort: Pulmonary effort is normal. No respiratory distress.     Breath sounds: Normal breath sounds.  Abdominal:     General: Bowel sounds are normal. There is no distension.     Palpations: Abdomen is soft. There is no mass.     Tenderness: There is no abdominal  tenderness. There is no guarding.  Musculoskeletal:     Cervical back: Normal range of motion.  Skin:    General: Skin is warm and dry.  Neurological:     Mental Status: She is alert and oriented to person, place, and time.  Psychiatric:        Behavior: Behavior normal.     ED Results / Procedures / Treatments   Labs (all labs ordered are listed, but only abnormal results are displayed) Labs Reviewed  BASIC METABOLIC PANEL - Abnormal; Notable for the following components:      Result Value   Glucose, Bld 107 (*)    Creatinine, Ser 1.52 (*)    GFR calc non Af Amer 34 (*)    GFR calc Af Amer 40 (*)    All other components within normal limits  CBC - Abnormal; Notable for the following components:   RBC 3.85 (*)    Hemoglobin 11.3 (*)    MCHC 29.9 (*)    Platelets 493 (*)    All other components within normal limits  RESPIRATORY PANEL BY RT PCR (FLU A&B, COVID)  SARS CORONAVIRUS 2 (TAT 6-24 HRS)  COMPREHENSIVE METABOLIC PANEL  CBC  POC SARS CORONAVIRUS 2 AG -  ED  TROPONIN I (HIGH SENSITIVITY)  TROPONIN I (HIGH SENSITIVITY)    EKG EKG Interpretation  Date/Time:  Thursday March 27 2020 14:41:15 EDT Ventricular Rate:  94 PR Interval:    QRS Duration: 88 QT Interval:  349 QTC Calculation: 437 R Axis:   2 Text Interpretation: Sinus rhythm Borderline short PR interval Low voltage, precordial leads Abnormal R-wave progression, early transition Since last tracing rate increased Confirmed by Karle Starch  MD, CHARLES [id number]) on 03/27/2020 4:04:56 PM   Radiology DG Chest Port 1 View  Result Date: 03/27/2020 CLINICAL DATA:  Chest pain. Congestion. EXAM: PORTABLE CHEST 1 VIEW COMPARISON:  Radiograph 11/19/2016 FINDINGS: Borderline cardiomegaly. Unchanged mediastinal contours. Patchy and heterogeneous bilateral lung opacities, right greater than left. Superimposed streaky atelectasis or scarring at the left  lung base is unchanged from prior. Peribronchial and mild interstitial  thickening. No large pleural effusion. No evidence of pneumothorax. Surgical hardware in the lower cervical spine is partially included. IMPRESSION: Patchy and heterogeneous bilateral lung opacities, right greater than left. This may represent pulmonary edema or multifocal infection, including atypical viral organisms (COVID-19). Electronically Signed   By: Keith Rake M.D.   On: 03/27/2020 15:25    Procedures .Critical Care Performed by: Margarita Mail, PA-C Authorized by: Margarita Mail, PA-C   Critical care provider statement:    Critical care time (minutes):  45   Critical care time was exclusive of:  Separately billable procedures and treating other patients   Critical care was necessary to treat or prevent imminent or life-threatening deterioration of the following conditions:  Respiratory failure   Critical care was time spent personally by me on the following activities:  Discussions with consultants, evaluation of patient's response to treatment, examination of patient, ordering and performing treatments and interventions, ordering and review of laboratory studies, ordering and review of radiographic studies, pulse oximetry, re-evaluation of patient's condition, obtaining history from patient or surrogate and review of old charts   (including critical care time)  Medications Ordered in ED Medications  heparin injection 5,000 Units (5,000 Units Subcutaneous Given 03/27/20 2152)  acetaminophen (TYLENOL) tablet 650 mg (has no administration in time range)    Or  acetaminophen (TYLENOL) suppository 650 mg (has no administration in time range)  ondansetron (ZOFRAN) tablet 4 mg (has no administration in time range)    Or  ondansetron (ZOFRAN) injection 4 mg (has no administration in time range)  albuterol (PROVENTIL) (2.5 MG/3ML) 0.083% nebulizer solution 2.5 mg (has no administration in time range)  benzonatate (TESSALON) capsule 100 mg (has no administration in time range)    cefTRIAXone (ROCEPHIN) 1 g in sodium chloride 0.9 % 100 mL IVPB (1 g Intravenous New Bag/Given 03/27/20 2158)  azithromycin (ZITHROMAX) tablet 500 mg (500 mg Oral Given 03/27/20 2150)    ED Course  I have reviewed the triage vital signs and the nursing notes.  Pertinent labs & imaging results that were available during my care of the patient were reviewed by me and considered in my medical decision making (see chart for details).    MDM Rules/Calculators/A&P                      This patient complains of cough, this involves an extensive number of treatment options, and is a complaint that carries with it a high risk of complications and morbidity.  The differential diagnosis includes Differential diagnosis for emergent cause of cough includes but is not limited to upper respiratory infection, lower respiratory infection, allergies, asthma, irritants, foreign body, medications such as ACE inhibitors, reflux, asthma, CHF, lung cancer, interstitial lung disease, psychiatric causes, postnasal drip and postinfectious bronchospasm.  I Ordered, reviewed, and interpreted labs, which included point-of-care Covid test which is negative.  CBC which shows no leukocytosis, normocytic anemia of 11.3.  BMP shows a slightly elevated creatinine which just above patient's baseline.  Troponin is negative x2.  I ordered imaging studies which included portable 1 view chest x-ray and I independently visualized and interpreted imaging which showed patchy bilateral lung opacities concerning for atypical pneumonia  Previous records obtained and reviewed  I consulted Dr. Humphrey Rolls who will admit the patient and discussed lab and imaging findings  Patient here with bilateral patchy infiltrates consistent with multifocal pneumonia likely atypical.  Patient will be admitted  for hypoxic respiratory failure with oxygen saturations below 90% on room air.  Her symptoms are concerning for COVID-19 infection.  I discussed findings  with the patient.  I reviewed the patient's EKG which shows normal sinus rhythm at a rate of 94.  Cheryl [name] was evaluated in Emergency Department on 03/27/2020 for the symptoms described in the history of present illness. She was evaluated in the context of the global COVID-19 pandemic, which necessitated consideration that the patient might be at risk for infection with the SARS-CoV-2 virus that causes COVID-19. Institutional protocols and algorithms that pertain to the evaluation of patients at risk for COVID-19 are in a state of rapid change based on information released by regulatory bodies including the CDC and federal and state organizations. These policies and algorithms were followed during the patient's care in the ED.   Final Clinical Impression(s) / ED Diagnoses Final diagnoses:  Multifocal pneumonia  Acute respiratory failure with hypoxia ([place])  Suspected COVID-19 virus infection    Rx / DC Orders ED Discharge Orders    None       Margarita Mail, PA-C 03/27/20 2304    Truddie Hidden, MD 03/28/20 1310

## 2020-03-28 DIAGNOSIS — J189 Pneumonia, unspecified organism: Secondary | ICD-10-CM | POA: Diagnosis not present

## 2020-03-28 DIAGNOSIS — N179 Acute kidney failure, unspecified: Secondary | ICD-10-CM

## 2020-03-28 LAB — COMPREHENSIVE METABOLIC PANEL
ALT: 15 U/L (ref 0–44)
AST: 21 U/L (ref 15–41)
Albumin: 3.1 g/dL — ABNORMAL LOW (ref 3.5–5.0)
Alkaline Phosphatase: 69 U/L (ref 38–126)
Anion gap: 9 (ref 5–15)
BUN: 17 mg/dL (ref 8–23)
CO2: 24 mmol/L (ref 22–32)
Calcium: 8.9 mg/dL (ref 8.9–10.3)
Chloride: 105 mmol/L (ref 98–111)
Creatinine, Ser: 1.4 mg/dL — ABNORMAL HIGH (ref 0.44–1.00)
GFR calc Af Amer: 44 mL/min — ABNORMAL LOW (ref >60)
GFR calc non Af Amer: 38 mL/min — ABNORMAL LOW (ref >60)
Glucose, Bld: 107 mg/dL — ABNORMAL HIGH (ref 70–99)
Potassium: 3.9 mmol/L (ref 3.5–5.1)
Sodium: 138 mmol/L (ref 135–145)
Total Bilirubin: 0.5 mg/dL (ref 0.3–1.2)
Total Protein: 7.2 g/dL (ref 6.5–8.1)

## 2020-03-28 LAB — CBC
HCT: 34.3 % — ABNORMAL LOW (ref 36.0–46.0)
Hemoglobin: 10.6 g/dL — ABNORMAL LOW (ref 12.0–15.0)
MCH: 30 pg (ref 26.0–34.0)
MCHC: 30.9 g/dL (ref 30.0–36.0)
MCV: 97.2 fL (ref 80.0–100.0)
Platelets: 477 10*3/uL — ABNORMAL HIGH (ref 150–400)
RBC: 3.53 MIL/uL — ABNORMAL LOW (ref 3.87–5.11)
RDW: 14.8 % (ref 11.5–15.5)
WBC: 8.9 10*3/uL (ref 4.0–10.5)
nRBC: 0 % (ref 0.0–0.2)

## 2020-03-28 LAB — SARS CORONAVIRUS 2 (TAT 6-24 HRS): SARS Coronavirus 2: NEGATIVE

## 2020-03-28 LAB — MRSA PCR SCREENING: MRSA by PCR: NEGATIVE

## 2020-03-28 MED ORDER — GUAIFENESIN-DM 100-10 MG/5ML PO SYRP
5.0000 mL | ORAL_SOLUTION | ORAL | Status: DC | PRN
  Administered 2020-03-28: 5 mL via ORAL
  Filled 2020-03-28: qty 5

## 2020-03-28 MED ORDER — GUAIFENESIN ER 600 MG PO TB12
600.0000 mg | ORAL_TABLET | Freq: Two times a day (BID) | ORAL | Status: DC
  Administered 2020-03-29: 600 mg via ORAL
  Filled 2020-03-28 (×2): qty 1

## 2020-03-28 MED ORDER — SODIUM CHLORIDE 0.9 % IV SOLN: INTRAVENOUS | Status: DC

## 2020-03-28 MED ORDER — CHLORHEXIDINE GLUCONATE CLOTH 2 % EX PADS
6.0000 | MEDICATED_PAD | Freq: Every day | CUTANEOUS | Status: DC
  Administered 2020-03-28 – 2020-03-29 (×2): 6 via TOPICAL

## 2020-03-28 MED ORDER — SODIUM CHLORIDE 0.9 % IV SOLN
500.0000 mg | INTRAVENOUS | Status: DC
  Administered 2020-03-28: 500 mg via INTRAVENOUS
  Filled 2020-03-28: qty 500

## 2020-03-28 MED ORDER — SODIUM CHLORIDE 0.9 % IV SOLN
1.0000 g | INTRAVENOUS | Status: DC
  Administered 2020-03-28: 1 g via INTRAVENOUS
  Filled 2020-03-28: qty 10

## 2020-03-28 MED ORDER — ORAL CARE MOUTH RINSE
15.0000 mL | Freq: Two times a day (BID) | OROMUCOSAL | Status: DC
  Administered 2020-03-28 – 2020-03-29 (×3): 15 mL via OROMUCOSAL

## 2020-03-28 NOTE — Progress Notes (Signed)
  Patient seen and evaluated, chart reviewed, please see EMR for updated orders. Please see full H&P dictated by admitting physician Dr. Humphrey Rolls for same date of service.    Brief Summary:-  74 y.o. female with medical history significant of GERD, diverticulosis, Hashimoto's thyroiditis and fatty liver admitted on 03/28/2020 with bilateral pneumonia right more than left   A/p  1)Bilateral Pneumonia right more than left--- continue Rocephin/azithromycin along with mucolytics and bronchodilators  2)AKI----acute kidney injury due to pneumonia and dehydration - creatinine on admission= 1.52, baseline creatinine = 1.1    , creatinine is now= 1.40, renally adjust medications, avoid nephrotoxic agents / dehydration  / hypotension -Continue gentle hydration  3)Acute hypoxic respiratory failure----secondary to #1 above, treat as above #1  -Patient seen and evaluated, chart reviewed, please see EMR for updated orders. Please see full H&P dictated by admitting physician Dr. Humphrey Rolls for same date of service.

## 2020-03-28 NOTE — Care Management Important Message (Signed)
Important Message  Patient Details  Name: Cheryl [name] MRN: [id number] Date of Birth: February 10, 1946   Medicare Important Message Given:  Yes     Tommy Medal 03/28/2020, 2:49 PM

## 2020-03-28 NOTE — H&P (Signed)
History and Physical    Cheryl [name] GMW:[id number] DOB: 10/18/1946 DOA: 03/27/2020  PCP: Sharilyn Sites, MD (Confirm with patient/family/NH records and if not entered, this has to be entered at [facility] point of entry) Patient coming from: Home  I have personally briefly reviewed patient's old medical records in [place]  Chief Complaint: Cough  HPI: Cheryl [name] is a 74 y.o. female with medical history significant of GERD, diverticulosis, Hashimoto's thyroiditis and fatty liver presented to ED for evaluation of productive cough and right-sided chest pain.  Patient states that the cough started about 5 days ago and it was dry and episodic in the start but now she is having productive cough that is bothering her again and again.  Patient is also complaining of right-sided chest pain that starts with coughing and gets better after the episode of cough resolves.  Patient states that she had her first Covid vaccine in February that made her very sick and her PCP advised her not to take the second Covid vaccine.  Patient also denies any contact with people having COVID-19 infection.  Patient is admitting of having occasional shortness of breath and some chills but denies fever, headache, dizziness, nausea, vomiting, abdominal pain and urinary symptoms.  ED Course: On arrival to the ED patient had temperature of 99.1, blood pressure 121/79, heart rate 96, respiratory rate 22 and oxygen saturation 93% on room air.  Blood work showed WBC 9.3, hemoglobin 11.3, BUN 21, creatinine 1.52 and blood glucose 107 x-ray chest positive for heterogeneous bilateral lung opacities.  Covid PCR negative.  Patient is started on IV ceftriaxone and p.o. azithromycin in the ED.  Review of Systems: As per HPI otherwise 10 point review of systems negative.   Past Medical History:  Diagnosis Date  . Acid reflux   . Arthritis   . Neuromuscular disorder ([place])   . Sinus drainage   . Sleep apnea    had surgery to correct      Past Surgical History:  Procedure Laterality Date  . ANTERIOR CERVICAL DECOMP/DISCECTOMY FUSION N/A 11/19/2016   Procedure: ANTERIOR CERVICAL DISCECTOMY AND FUSION C5-6 WITH PLATES, SCREWS, CAGE, VIVIGEN II;  Surgeon: Jessy Oto, MD;  Location: [place];  Service: Orthopedics;  Laterality: N/A;  . CARPAL TUNNEL RELEASE    . CATARACT EXTRACTION, BILATERAL Bilateral 2017   One in December 2017 and one in January 2018  . COLONOSCOPY  02/15/2005   YQI:HKVQQ small polyps ablated via cold biopsy, one from transverse colon and two from the rectum/Small external hemorrhoids  . COLONOSCOPY  07/10/2010   VZD:GLOVFI rectum/long redundant colon, polyps in the sigmoid, descending, hepatic flexure/ADENOMATOUS POLYPS. next TCS due 06/2015  . COLONOSCOPY  1995   3 polyps, path revealed chronic colitis  . COLONOSCOPY N/A 08/05/2015   Procedure: COLONOSCOPY;  Surgeon: Daneil Dolin, MD;  [facility];  Service: Endoscopy;  Laterality: N/A;  1000  . ESOPHAGOGASTRODUODENOSCOPY  1995   Gastritis  . ESOPHAGOGASTRODUODENOSCOPY N/A 04/16/2013   RMR: HH  . ESOPHAGOGASTRODUODENOSCOPY N/A 05/13/2016   Procedure: ESOPHAGOGASTRODUODENOSCOPY (EGD);  Surgeon: Daneil Dolin, MD;  [facility];  Service: Endoscopy;  Laterality: N/A;  145  . ESOPHAGOGASTRODUODENOSCOPY N/A 08/18/2016   Procedure: ESOPHAGOGASTRODUODENOSCOPY (EGD);  Surgeon: Daneil Dolin, MD;  [facility];  Service: Endoscopy;  Laterality: N/A;  7:45 AM  . KNEE SURGERY     x2  . ROTATOR CUFF REPAIR     x2  . SHOULDER  ARTHROSCOPY WITH ROTATOR CUFF REPAIR AND SUBACROMIAL DECOMPRESSION Right 01/21/2015   Procedure: RIGHT SHOULDER ARTHROSCOPIC DEBRIDEMNT OF G-H JOINT AND REMOVAL OF LOOSE BODIES,ARTHROSCOPIC SUBACROMIAL DECOMPRESSION,MINI OPEN RCT REPAIR WITH SUPPLEMENTAL [facility] PATCH;  Surgeon: Garald Balding, MD;  Location: [place];  Service: Orthopedics;  Laterality: Right;  . TONSILLECTOMY    . TRIGGER FINGER RELEASE     . UVULOPALATOPHARYNGOPLASTY       reports that she quit smoking about 14 years ago. Her smoking use included cigarettes. She has a 4.80 pack-year smoking history. She has never used smokeless tobacco. She reports that she does not drink alcohol or use drugs.  Allergies  Allergen Reactions  . Prednisone Other (See Comments)    Hallucinations   . Tramadol Nausea Only  . Aspirin Nausea And Vomiting  . Celecoxib Rash    Family History  Problem Relation Age of Onset  . Cancer Mother   . Cancer Father   . Diabetes Sister   . Colon cancer Neg Hx   . Liver disease Neg Hx      Prior to Admission medications   Medication Sig Start Date End Date Taking? Authorizing Provider  acetaminophen (TYLENOL) 500 MG tablet Take 500-1,000 mg by mouth every 6 (six) hours as needed (for pain/headache).    [provider]  albuterol (PROVENTIL HFA;VENTOLIN HFA) 108 (90 BASE) MCG/ACT inhaler Inhale 1 puff into the lungs every 6 (six) hours as needed for wheezing or shortness of breath.    [provider]  allopurinol (ZYLOPRIM) 300 MG tablet Take 300 mg by mouth at bedtime.    [provider]  Biotin 5000 MCG TABS Take 5,000 mcg by mouth daily.    [provider]  buPROPion (WELLBUTRIN XL) 300 MG 24 hr tablet Take 300 mg by mouth daily. 05/15/19   [provider]  CALCIUM-VITAMIN D PO Take 1 tablet by mouth every morning.    [provider]  diclofenac sodium (VOLTAREN) 1 % GEL Apply 2-4 g topically 4 (four) times daily. 08/02/17   Cherylann Ratel, PA-C  febuxostat (ULORIC) 40 MG tablet Take 40 mg by mouth at bedtime.     [provider]  finasteride (PROSCAR) 5 MG tablet Take 1 mg by mouth at bedtime.    [provider]  fluticasone (FLONASE) 50 MCG/ACT nasal spray Place 1 spray into both nostrils daily as needed for allergies (sinuses).    [provider]  linaclotide Rolan Lipa) 290 MCG CAPS capsule Take 1 capsule (290 mcg  total) by mouth daily before breakfast. 10/30/19   Erenest Rasher, PA-C  montelukast (SINGULAIR) 10 MG tablet Take 10 mg by mouth every morning.    [provider]  pantoprazole (PROTONIX) 40 MG tablet Take 1 tablet (40 mg total) by mouth daily. 02/18/17   Philemon Kingdom, MD  polyethylene glycol (MIRALAX / GLYCOLAX) packet Take 17 g by mouth daily as needed.    [provider]  raloxifene (EVISTA) 60 MG tablet Take 60 mg by mouth daily.    [provider]  tolterodine (DETROL) 1 MG tablet Take 1 mg by mouth 2 (two) times daily.    [provider]  vitamin B-12 (CYANOCOBALAMIN) 1000 MCG tablet Take 1,000 mcg by mouth daily.    [provider]    Physical Exam: Vitals:   03/28/20 0000 03/28/20 0030 03/28/20 0145 03/28/20 0154  BP: (!) 95/58 112/65  128/72  Pulse: 95 92  92  Resp: (!) 25 (!) 24  18  Temp:    99 F (37.2 C)  TempSrc:    Oral  SpO2: (!) 89% (!) 86% 94% 100%  Weight:    94.6 kg  Height:    4\' 11"  (1.499 m)    Constitutional: NAD, calm, comfortable Vitals:   03/28/20 0000 03/28/20 0030 03/28/20 0145 03/28/20 0154  BP: (!) 95/58 112/65  128/72  Pulse: 95 92  92  Resp: (!) 25 (!) 24  18  Temp:    99 F (37.2 C)  TempSrc:    Oral  SpO2: (!) 89% (!) 86% 94% 100%  Weight:    94.6 kg  Height:    4\' 11"  (1.499 m)   Eyes: PERRL, lids and conjunctivae normal ENMT: Mucous membranes are moist. Posterior pharynx clear of any exudate or lesions.Normal dentition.  Neck: normal, supple, no masses, no thyromegaly Respiratory: Diminished breath sounds bilaterally in both lungs bases.  No wheezes rales or rhonchi appreciated. Normal respiratory effort. No accessory muscle use.  Cardiovascular: Regular rate and rhythm, no murmurs / rubs / gallops. No extremity edema. 2+ pedal pulses. No carotid bruits.  Abdomen: no tenderness, no masses palpated. No hepatosplenomegaly. Bowel sounds positive.  Musculoskeletal: no clubbing / cyanosis. No  joint deformity upper and lower extremities. Good ROM, no contractures. Normal muscle tone.  Skin: no rashes, lesions, ulcers. No induration Neurologic: CN 2-12 grossly intact. Sensation intact, DTR normal. Strength 5/5 in all 4.  Psychiatric: Normal judgment and insight. Alert and oriented x 3. Normal mood.   Labs on Admission: I have personally reviewed following labs and imaging studies  CBC: Recent Labs  Lab 03/27/20 1453 03/28/20 0505  WBC 9.3 8.9  HGB 11.3* 10.6*  HCT 37.8 34.3*  MCV 98.2 97.2  PLT 493* 546*   Basic Metabolic Panel: Recent Labs  Lab 03/27/20 1453  NA 138  K 4.0  CL 105  CO2 22  GLUCOSE 107*  BUN 21  CREATININE 1.52*  CALCIUM 9.2   GFR: Estimated Creatinine Clearance: 34.2 mL/min (A) (by C-G formula based on SCr of 1.52 mg/dL (H)). Liver Function Tests: No results for input(s): AST, ALT, ALKPHOS, BILITOT, PROT, ALBUMIN in the last 168 hours. No results for input(s): LIPASE, AMYLASE in the last 168 hours. No results for input(s): AMMONIA in the last 168 hours. Coagulation Profile: No results for input(s): INR, PROTIME in the last 168 hours. Cardiac Enzymes: No results for input(s): CKTOTAL, CKMB, CKMBINDEX, TROPONINI in the last 168 hours. BNP (last 3 results) No results for input(s): PROBNP in the last 8760 hours. HbA1C: No results for input(s): HGBA1C in the last 72 hours. CBG: No results for input(s): GLUCAP in the last 168 hours. Lipid Profile: No results for input(s): CHOL, HDL, LDLCALC, TRIG, CHOLHDL, LDLDIRECT in the last 72 hours. Thyroid Function Tests: No results for input(s): TSH, T4TOTAL, FREET4, T3FREE, THYROIDAB in the last 72 hours. Anemia Panel: No results for input(s): VITAMINB12, FOLATE, FERRITIN, TIBC, IRON, RETICCTPCT in the last 72 hours. Urine analysis:    Component Value Date/Time   COLORURINE YELLOW 11/19/2016 1145   APPEARANCEUR CLEAR 11/19/2016 1145   LABSPEC 1.020 11/19/2016 1145   PHURINE 5.5 11/19/2016 1145     GLUCOSEU NEGATIVE 11/19/2016 1145   HGBUR SMALL (A) 11/19/2016 1145   BILIRUBINUR NEGATIVE 11/19/2016 1145   KETONESUR NEGATIVE 11/19/2016 1145   PROTEINUR NEGATIVE 11/19/2016 1145   UROBILINOGEN 0.2 02/18/2012 2113   NITRITE NEGATIVE 11/19/2016 1145   LEUKOCYTESUR NEGATIVE 11/19/2016 1145    Radiological Exams  on Admission: DG Chest Port 1 View  Result Date: 03/27/2020 CLINICAL DATA:  Chest pain. Congestion. EXAM: PORTABLE CHEST 1 VIEW COMPARISON:  Radiograph 11/19/2016 FINDINGS: Borderline cardiomegaly. Unchanged mediastinal contours. Patchy and heterogeneous bilateral lung opacities, right greater than left. Superimposed streaky atelectasis or scarring at the left lung base is unchanged from prior. Peribronchial and mild interstitial thickening. No large pleural effusion. No evidence of pneumothorax. Surgical hardware in the lower cervical spine is partially included. IMPRESSION: Patchy and heterogeneous bilateral lung opacities, right greater than left. This may represent pulmonary edema or multifocal infection, including atypical viral organisms (COVID-19). Electronically Signed   By: Keith Rake M.D.   On: 03/27/2020 15:25      Assessment/Plan Principal Problem:   Pneumonia Continue IV ceftriaxone 1 g daily with azithromycin 500 mg daily for 5 days. Oxygen supplementation with nasal cannula as needed. Tessalon Perles for cough as needed every 8 hours. Albuterol inhaler for shortness of breath as needed. Tylenol for fever as needed Blood cultures ordered  Active Problems:   AKI (acute kidney injury) (HCC) IV normal saline at the rate of 75 mL/h. Continue to monitor creatinine level   DVT prophylaxis: Heparin Code Status: Full code Family Communication:  Disposition Plan:  Consults called:  Admission status: Patient/MedSurg   Edmonia Lynch MD [facility] Pager [phone]   If 7PM-7AM, please contact night-coverage [email or web address] Password [email or web address]  03/28/2020,  6:01 AM

## 2020-03-29 DIAGNOSIS — J189 Pneumonia, unspecified organism: Secondary | ICD-10-CM | POA: Diagnosis not present

## 2020-03-29 DIAGNOSIS — N179 Acute kidney failure, unspecified: Secondary | ICD-10-CM | POA: Diagnosis not present

## 2020-03-29 MED ORDER — GUAIFENESIN ER 600 MG PO TB12: 600.0000 mg | ORAL_TABLET | Freq: Two times a day (BID) | ORAL | 0 refills | Status: AC

## 2020-03-29 MED ORDER — MONTELUKAST SODIUM 10 MG PO TABS: 10.0000 mg | ORAL_TABLET | Freq: Every morning | ORAL | 2 refills | Status: AC

## 2020-03-29 MED ORDER — DEXAMETHASONE 4 MG PO TABS: 4.0000 mg | ORAL_TABLET | Freq: Every day | ORAL | 0 refills | Status: AC

## 2020-03-29 MED ORDER — ALBUTEROL SULFATE HFA 108 (90 BASE) MCG/ACT IN AERS: 2.0000 | INHALATION_SPRAY | Freq: Four times a day (QID) | RESPIRATORY_TRACT | 2 refills | Status: DC | PRN

## 2020-03-29 MED ORDER — CEFDINIR 300 MG PO CAPS: 300.0000 mg | ORAL_CAPSULE | Freq: Two times a day (BID) | ORAL | 0 refills | Status: AC

## 2020-03-29 MED ORDER — AZITHROMYCIN 500 MG PO TABS: 500.0000 mg | ORAL_TABLET | Freq: Every day | ORAL | 0 refills | Status: AC

## 2020-03-29 NOTE — Discharge Instructions (Signed)
1) you have pneumonia (lung Infection)  in both lungs--- please take antibiotics as prescribed 2) please reschedule your COVID-19 vaccination for about 10 days from now as long as you have no fevers you should be able to get your Covid -19 vaccination in about 10 days from now or so 3) follow-up with your regular doctor for recheck and reevaluation in 1 to 2 weeks from now--- sooner if you develop fevers or respiratory problems

## 2020-03-29 NOTE — Progress Notes (Signed)
SATURATION QUALIFICATIONS: (This note is used to comply with regulatory documentation for home oxygen)  Patient Saturations on Room Air at Rest = 95%  Patient Saturations on Room Air while Ambulating = 92% after two laps around ICU

## 2020-03-29 NOTE — Progress Notes (Signed)
Patient discharge to home take to car via wheelchair.

## 2020-03-29 NOTE — Discharge Summary (Signed)
Cheryl [name], is a 74 y.o. female  DOB [birth year]  MRN [id number].  Admission date:  03/27/2020  Admitting Physician  Edmonia Lynch, DO  Discharge Date:  03/29/2020   Primary MD  Sharilyn Sites, MD  Recommendations for primary care physician for things to follow:   1) you have pneumonia (lung Infection)  in both lungs--- please take antibiotics as prescribed 2) please reschedule your COVID-19 vaccination for about 10 days from now as long as you have no fevers you should be able to get your Covid -19 vaccination in about 10 days from now or so 3) follow-up with your regular doctor for recheck and reevaluation in 1 to 2 weeks from now--- sooner if you develop fevers or respiratory problems  Admission Diagnosis  Pneumonia [J18.9] Acute respiratory failure with hypoxia ([place]) [J96.01] Multifocal pneumonia [J18.9] Suspected COVID-19 virus infection [Z20.822]   Discharge Diagnosis  Pneumonia [J18.9] Acute respiratory failure with hypoxia ([place]) [J96.01] Multifocal pneumonia [J18.9] Suspected COVID-19 virus infection [Z20.822]    Principal Problem:   Pneumonia Active Problems:   AKI (acute kidney injury) ([place])      Past Medical History:  Diagnosis Date  . Acid reflux   . Arthritis   . Neuromuscular disorder ([place])   . Sinus drainage   . Sleep apnea    had surgery to correct    Past Surgical History:  Procedure Laterality Date  . ANTERIOR CERVICAL DECOMP/DISCECTOMY FUSION N/A 11/19/2016   Procedure: ANTERIOR CERVICAL DISCECTOMY AND FUSION C5-6 WITH PLATES, SCREWS, CAGE, VIVIGEN II;  Surgeon: Jessy Oto, MD;  Location: [place];  Service: Orthopedics;  Laterality: N/A;  . CARPAL TUNNEL RELEASE    . CATARACT EXTRACTION, BILATERAL Bilateral 2017   One in December 2017 and one in January 2018  . COLONOSCOPY  02/15/2005   BMW:UXLKG small polyps ablated via cold biopsy, one from transverse colon and  two from the rectum/Small external hemorrhoids  . COLONOSCOPY  07/10/2010   MWN:UUVOZD rectum/long redundant colon, polyps in the sigmoid, descending, hepatic flexure/ADENOMATOUS POLYPS. next TCS due 06/2015  . COLONOSCOPY  1995   3 polyps, path revealed chronic colitis  . COLONOSCOPY N/A 08/05/2015   Procedure: COLONOSCOPY;  Surgeon: Daneil Dolin, MD;  [facility];  Service: Endoscopy;  Laterality: N/A;  1000  . ESOPHAGOGASTRODUODENOSCOPY  1995   Gastritis  . ESOPHAGOGASTRODUODENOSCOPY N/A 04/16/2013   RMR: HH  . ESOPHAGOGASTRODUODENOSCOPY N/A 05/13/2016   Procedure: ESOPHAGOGASTRODUODENOSCOPY (EGD);  Surgeon: Daneil Dolin, MD;  [facility];  Service: Endoscopy;  Laterality: N/A;  145  . ESOPHAGOGASTRODUODENOSCOPY N/A 08/18/2016   Procedure: ESOPHAGOGASTRODUODENOSCOPY (EGD);  Surgeon: Daneil Dolin, MD;  [facility];  Service: Endoscopy;  Laterality: N/A;  7:45 AM  . KNEE SURGERY     x2  . ROTATOR CUFF REPAIR     x2  . SHOULDER ARTHROSCOPY WITH ROTATOR CUFF REPAIR AND SUBACROMIAL DECOMPRESSION Right 01/21/2015   Procedure: RIGHT SHOULDER ARTHROSCOPIC DEBRIDEMNT OF G-H JOINT AND REMOVAL OF LOOSE BODIES,ARTHROSCOPIC  SUBACROMIAL DECOMPRESSION,MINI OPEN RCT REPAIR WITH SUPPLEMENTAL [facility] PATCH;  Surgeon: Garald Balding, MD;  Location: [place];  Service: Orthopedics;  Laterality: Right;  . TONSILLECTOMY    . TRIGGER FINGER RELEASE    . UVULOPALATOPHARYNGOPLASTY       HPI  from the history and physical done on the day of admission:   Chief Complaint: Cough  HPI: Cheryl [name] is a 74 y.o. female with medical history significant of GERD, diverticulosis, Hashimoto's thyroiditis and fatty liver presented to ED for evaluation of productive cough and right-sided chest pain.  Patient states that the cough started about 5 days ago and it was dry and episodic in the start but now she is having productive cough that is bothering her again and again.  Patient is  also complaining of right-sided chest pain that starts with coughing and gets better after the episode of cough resolves.  Patient states that she had her first Covid vaccine in February that made her very sick and her PCP advised her not to take the second Covid vaccine.  Patient also denies any contact with people having COVID-19 infection.  Patient is admitting of having occasional shortness of breath and some chills but denies fever, headache, dizziness, nausea, vomiting, abdominal pain and urinary symptoms.  ED Course: On arrival to the ED patient had temperature of 99.1, blood pressure 121/79, heart rate 96, respiratory rate 22 and oxygen saturation 93% on room air.  Blood work showed WBC 9.3, hemoglobin 11.3, BUN 21, creatinine 1.52 and blood glucose 107 x-ray chest positive for heterogeneous bilateral lung opacities.  Covid PCR negative.  Patient is started on IV ceftriaxone and p.o. azithromycin in the ED.  Review of Systems: As per HPI otherwise 10 point review of systems negative.    Hospital Course:     Brief Summary:- 74 y.o.femalewith medical history significant ofGERD, diverticulosis, Hashimoto's thyroiditis and fatty liver admitted on 03/28/2020 with bilateral pneumonia right more than left   A/p  1)Bilateral Pneumonia right more than left--- treated with Rocephin/azithromycin along with mucolytics and bronchodilators - overall improved, discharged on Omnicef and azithromycin  2)AKI----acute kidney injury due to pneumonia and dehydration - creatinine on admission= 1.52, baseline creatinine = 1.1    , creatinine is now= 1.40, renally adjust medications, avoid nephrotoxic agents / dehydration  / hypotension  3)Acute hypoxic respiratory failure----secondary to #1 above, treat as above #1----  hypoxia resolved prior to discharge  Discharge Condition: stable  Follow UP  Consults obtained - na  Diet and Activity recommendation:  As advised  Discharge Instructions     Discharge Instructions    Call MD for:  difficulty breathing, headache or visual disturbances   Complete by: As directed    Call MD for:  extreme fatigue   Complete by: As directed    Call MD for:  persistant dizziness or light-headedness   Complete by: As directed    Call MD for:  persistant nausea and vomiting   Complete by: As directed    Call MD for:  severe uncontrolled pain   Complete by: As directed    Call MD for:  temperature >100.4   Complete by: As directed    Diet - low sodium heart healthy   Complete by: As directed    Discharge instructions   Complete by: As directed    1) you have pneumonia (lung Infection)  in both lungs--- please take antibiotics as prescribed 2) please reschedule your COVID-19 vaccination for about 10 days  from now as long as you have no fevers you should be able to get your Covid -19 vaccination in about 10 days from now or so 3) follow-up with your regular doctor for recheck and reevaluation in 1 to 2 weeks from now--- sooner if you develop fevers or respiratory problems   Increase activity slowly   Complete by: As directed        Discharge Medications     Allergies as of 03/29/2020      Reactions   Prednisone Other (See Comments)   Hallucinations    Tramadol Nausea Only   Aspirin Nausea And Vomiting   Celecoxib Rash      Medication List    STOP taking these medications   finasteride 5 MG tablet Commonly known as: PROSCAR     TAKE these medications   acetaminophen 500 MG tablet Commonly known as: TYLENOL Take 500-1,000 mg by mouth every 6 (six) hours as needed (for pain/headache).   albuterol 108 (90 Base) MCG/ACT inhaler Commonly known as: VENTOLIN HFA Inhale 2 puffs into the lungs every 6 (six) hours as needed for wheezing or shortness of breath. What changed: how much to take   allopurinol 300 MG tablet Commonly known as: ZYLOPRIM Take 300 mg by mouth at bedtime.   azithromycin 500 MG tablet Commonly known as:  ZITHROMAX Take 1 tablet (500 mg total) by mouth daily for 5 days.   Biotin 5000 MCG Tabs Take 5,000 mcg by mouth daily as needed (hair loss).   buPROPion 300 MG 24 hr tablet Commonly known as: WELLBUTRIN XL Take 300 mg by mouth daily.   CALCIUM-VITAMIN D PO Take 1 tablet by mouth every morning.   cefdinir 300 MG capsule Commonly known as: OMNICEF Take 1 capsule (300 mg total) by mouth 2 (two) times daily for 5 days.   dexamethasone 4 MG tablet Commonly known as: DECADRON Take 1 tablet (4 mg total) by mouth daily with breakfast for 5 days.   diclofenac sodium 1 % Gel Commonly known as: VOLTAREN Apply 2-4 g topically 4 (four) times daily.   febuxostat 40 MG tablet Commonly known as: ULORIC Take 40 mg by mouth at bedtime.   fluticasone 50 MCG/ACT nasal spray Commonly known as: FLONASE Place 1 spray into both nostrils daily as needed for allergies (sinuses).   guaiFENesin 600 MG 12 hr tablet Commonly known as: MUCINEX Take 1 tablet (600 mg total) by mouth 2 (two) times daily.   linaclotide 290 MCG Caps capsule Commonly known as: Linzess Take 1 capsule (290 mcg total) by mouth daily before breakfast.   montelukast 10 MG tablet Commonly known as: SINGULAIR Take 1 tablet (10 mg total) by mouth every morning.   nystatin 100000 UNIT/ML suspension Commonly known as: MYCOSTATIN Take 5 mLs by mouth 2 (two) times daily.   pantoprazole 40 MG tablet Commonly known as: PROTONIX Take 1 tablet (40 mg total) by mouth daily.   polyethylene glycol 17 g packet Commonly known as: MIRALAX / GLYCOLAX Take 17 g by mouth daily as needed.   raloxifene 60 MG tablet Commonly known as: EVISTA Take 60 mg by mouth daily.   rOPINIRole 0.25 MG tablet Commonly known as: REQUIP Take 0.75 mg by mouth daily.   tolterodine 1 MG tablet Commonly known as: DETROL Take 1 mg by mouth 2 (two) times daily.   vitamin B-12 1000 MCG tablet Commonly known as: CYANOCOBALAMIN Take 1,000 mcg by  mouth daily.       Major procedures and  Radiology Reports - PLEASE review detailed and final reports for all details, in brief -   DG Chest Port 1 View  Result Date: 03/27/2020 CLINICAL DATA:  Chest pain. Congestion. EXAM: PORTABLE CHEST 1 VIEW COMPARISON:  Radiograph 11/19/2016 FINDINGS: Borderline cardiomegaly. Unchanged mediastinal contours. Patchy and heterogeneous bilateral lung opacities, right greater than left. Superimposed streaky atelectasis or scarring at the left lung base is unchanged from prior. Peribronchial and mild interstitial thickening. No large pleural effusion. No evidence of pneumothorax. Surgical hardware in the lower cervical spine is partially included. IMPRESSION: Patchy and heterogeneous bilateral lung opacities, right greater than left. This may represent pulmonary edema or multifocal infection, including atypical viral organisms (COVID-19). Electronically Signed   By: Keith Rake M.D.   On: 03/27/2020 15:25    Micro Results    Recent Results (from the past 240 hour(s))  SARS CORONAVIRUS 2 (TAT 6-24 HRS) Nasopharyngeal Nasopharyngeal Swab     Status: None   Collection Time: 03/27/20  4:49 PM   Specimen: Nasopharyngeal Swab  Result Value Ref Range Status   SARS Coronavirus 2 NEGATIVE NEGATIVE Final    Comment: (NOTE) SARS-CoV-2 target nucleic acids are NOT DETECTED. The SARS-CoV-2 RNA is generally detectable in upper and lower respiratory specimens during the acute phase of infection. Negative results do not preclude SARS-CoV-2 infection, do not rule out co-infections with other pathogens, and should not be used as the sole basis for treatment or other patient management decisions. Negative results must be combined with clinical observations, patient history, and epidemiological information. The expected result is Negative. Fact Sheet for Patients: SugarRoll.be Fact Sheet for Healthcare  Providers: https://www.woods-mathews.com/ This test is not yet approved or cleared by the [place] FDA and  has been authorized for detection and/or diagnosis of SARS-CoV-2 by FDA under an Emergency Use Authorization (EUA). This EUA will remain  in effect (meaning this test can be used) for the duration of the COVID-19 declaration under Section 56 4(b)(1) of the Act, 21 U.S.C. section 360bbb-3(b)(1), unless the authorization is terminated or revoked sooner. Performed at [facility], [place] [address] [place], [place] [address]   Respiratory Panel by RT PCR (Flu A&B, Covid) - Nasopharyngeal Swab     Status: None   Collection Time: 03/27/20  7:48 PM   Specimen: Nasopharyngeal Swab  Result Value Ref Range Status   SARS Coronavirus 2 by RT PCR NEGATIVE NEGATIVE Final    Comment: (NOTE) SARS-CoV-2 target nucleic acids are NOT DETECTED. The SARS-CoV-2 RNA is generally detectable in upper respiratoy specimens during the acute phase of infection. The lowest concentration of SARS-CoV-2 viral copies this assay can detect is 131 copies/mL. A negative result does not preclude SARS-Cov-2 infection and should not be used as the sole basis for treatment or other patient management decisions. A negative result may occur with  improper specimen collection/handling, submission of specimen other than nasopharyngeal swab, presence of viral mutation(s) within the areas targeted by this assay, and inadequate number of viral copies (<131 copies/mL). A negative result must be combined with clinical observations, patient history, and epidemiological information. The expected result is Negative. Fact Sheet for Patients:  PinkCheek.be Fact Sheet for Healthcare Providers:  GravelBags.it This test is not yet ap proved or cleared by the [place] FDA and  has been authorized for detection and/or diagnosis of SARS-CoV-2 by FDA  under an Emergency Use Authorization (EUA). This EUA will remain  in effect (meaning this test can be used) for the duration of the  COVID-19 declaration under Section 564(b)(1) of the Act, 21 U.S.C. section 360bbb-3(b)(1), unless the authorization is terminated or revoked sooner.    Influenza A by PCR NEGATIVE NEGATIVE Final   Influenza B by PCR NEGATIVE NEGATIVE Final    Comment: (NOTE) The Xpert Xpress SARS-CoV-2/FLU/RSV assay is intended as an aid in  the diagnosis of influenza from Nasopharyngeal swab specimens and  should not be used as a sole basis for treatment. Nasal washings and  aspirates are unacceptable for Xpert Xpress SARS-CoV-2/FLU/RSV  testing. Fact Sheet for Patients: PinkCheek.be Fact Sheet for Healthcare Providers: GravelBags.it This test is not yet approved or cleared by the [place] FDA and  has been authorized for detection and/or diagnosis of SARS-CoV-2 by  FDA under an Emergency Use Authorization (EUA). This EUA will remain  in effect (meaning this test can be used) for the duration of the  Covid-19 declaration under Section 564(b)(1) of the Act, 21  U.S.C. section 360bbb-3(b)(1), unless the authorization is  terminated or revoked. Performed at [facility], [address], [place], [place] [address]   MRSA PCR Screening     Status: None   Collection Time: 03/28/20  2:03 AM   Specimen: Nasopharyngeal  Result Value Ref Range Status   MRSA by PCR NEGATIVE NEGATIVE Final    Comment:        The GeneXpert MRSA Assay (FDA approved for NASAL specimens only), is one component of a comprehensive MRSA colonization surveillance program. It is not intended to diagnose MRSA infection nor to guide or monitor treatment for MRSA infections. Performed at [facility], [address], [place], [place] [address]   Culture, blood (routine x 2)     Status: None (Preliminary result)   Collection Time: 03/28/20   7:42 AM   Specimen: BLOOD LEFT ARM  Result Value Ref Range Status   Specimen Description BLOOD LEFT ARM  Final   Special Requests   Final    BOTTLES DRAWN AEROBIC AND ANAEROBIC Blood Culture adequate volume   Culture   Final    NO GROWTH 1 DAY Performed at [facility], [address], [place], [place] [address]    Report Status PENDING  Incomplete  Culture, blood (routine x 2)     Status: None (Preliminary result)   Collection Time: 03/28/20  7:42 AM   Specimen: BLOOD LEFT HAND  Result Value Ref Range Status   Specimen Description BLOOD LEFT HAND  Final   Special Requests   Final    BOTTLES DRAWN AEROBIC AND ANAEROBIC Blood Culture adequate volume   Culture   Final    NO GROWTH 1 DAY Performed at [facility], [address] [place], [place] [address]    Report Status PENDING  Incomplete    Today   Subjective    Tristan Mawson today has no  New complaints, - cough and shortness of breath much improved, hypoxia resolved -Patient Saturations on Room Air at Rest = 95%  Patient Saturations on Room Air while Ambulating = 92% after two laps around ICU       Patient has been seen and examined prior to discharge   Objective   Blood pressure 124/74, pulse 84, temperature 98.5 F (36.9 C), temperature source Oral, resp. rate 18, height 4\' 11"  (1.499 m), weight 96.2 kg, SpO2 93 %.   Intake/Output Summary (Last 24 hours) at 03/29/2020 1259 Last data filed at 03/29/2020 1000 Gross per 24 hour  Intake 2096.59 ml  Output --  Net 2096.59 ml  Exam Gen:- Awake Alert, no acute distress , speaking in complete sentences HEENT:- [place].AT, No sclera icterus Neck-Supple Neck,No JVD,.  Lungs-   Much improved air movement, no wheezing CV- S1, S2 normal, regular Abd-  +ve B.Sounds, Abd Soft, No tenderness,    Extremity/Skin:- No  edema,   good pulses Psych-affect is appropriate, oriented x3 Neuro-no new focal deficits, no tremors    Data Review   CBC w Diff:  Lab Results  Component  Value Date   WBC 8.9 03/28/2020   HGB 10.6 (L) 03/28/2020   HCT 34.3 (L) 03/28/2020   HCT 37 03/23/2013   PLT 477 (H) 03/28/2020   LYMPHOPCT 23 02/19/2012   MONOPCT 7 02/19/2012   EOSPCT 3 02/19/2012   BASOPCT 0 02/19/2012    CMP:  Lab Results  Component Value Date   NA 138 03/28/2020   NA 142 03/23/2013   K 3.9 03/28/2020   K 4.3 03/23/2013   CL 105 03/28/2020   CO2 24 03/28/2020   BUN 17 03/28/2020   BUN 17 03/23/2013   CREATININE 1.40 (H) 03/28/2020   CREATININE 0.98 03/23/2013   PROT 7.2 03/28/2020   ALBUMIN 3.1 (L) 03/28/2020   ALBUMIN 4.1 03/23/2013   BILITOT 0.5 03/28/2020   BILITOT 0.2 03/23/2013   ALKPHOS 69 03/28/2020   ALKPHOS 76 03/23/2013   AST 21 03/28/2020   AST 57 03/23/2013   ALT 15 03/28/2020  .   Total Discharge time is about 33 minutes  Roxan Hockey M.D on 03/29/2020 at 12:59 PM  Go to [email or web address] -  for contact info  [facility] - Office  [phone]

## 2020-04-01 NOTE — Progress Notes (Signed)
This encounter was created in error - please disregard.

## 2020-04-02 LAB — CULTURE, BLOOD (ROUTINE X 2)
Culture: NO GROWTH
Culture: NO GROWTH
Special Requests: ADEQUATE
Special Requests: ADEQUATE

## 2020-04-03 ENCOUNTER — Other Ambulatory Visit: Payer: Self-pay | Admitting: *Deleted

## 2020-04-03 DIAGNOSIS — E7849 Other hyperlipidemia: Secondary | ICD-10-CM | POA: Diagnosis not present

## 2020-04-03 DIAGNOSIS — K219 Gastro-esophageal reflux disease without esophagitis: Secondary | ICD-10-CM | POA: Diagnosis not present

## 2020-04-03 DIAGNOSIS — Z681 Body mass index (BMI) 19 or less, adult: Secondary | ICD-10-CM | POA: Diagnosis not present

## 2020-04-03 DIAGNOSIS — J189 Pneumonia, unspecified organism: Secondary | ICD-10-CM | POA: Diagnosis not present

## 2020-04-03 NOTE — Patient Outreach (Signed)
[facility]) Care Management  04/03/2020  Walida Cajas [facility] 1946-01-25 [id number]   EMMI-GENERAL DISCHARGE-UNSUCCESSFUL RED ON EMMI ALERT Day #1 Date: 03/31/2020 Red Alert Reason:Unfilled prescriptions  Outreach#1  RN attempted outreach however unsuccessful. Will rescheduled another outreach over the next week.  [name], RN Care Management Coordinator [place] Office [phone]

## 2020-04-07 ENCOUNTER — Other Ambulatory Visit: Payer: Self-pay | Admitting: *Deleted

## 2020-04-07 NOTE — Patient Outreach (Signed)
[facility]) Care Management  04/07/2020  Cheryl [name] [facility] 1946-04-06 [id number]   EMMI-GENERAL DISCHARGE-RESOLVED RED ON EMMI ALERT Day #1 Date: 4/12/20211 Red Alert Reason:  Unfilled prescriptions   Outreach #2 RN spoke with pt concerning the above emmi. Pt states this was a mistake and she all her medications filled. No needs all issues resolved.  Plan: Will closed this emmi with resolved issues and no needs.  [name], RN Care Management Coordinator [place] Office [phone]

## 2020-04-25 ENCOUNTER — Other Ambulatory Visit (HOSPITAL_COMMUNITY): Payer: Self-pay | Admitting: Family Medicine

## 2020-04-25 ENCOUNTER — Ambulatory Visit (HOSPITAL_COMMUNITY)
Admission: RE | Admit: 2020-04-25 | Discharge: 2020-04-25 | Disposition: A | Discharge status: Home / Self Care | Payer: Medicare Other | Source: Ambulatory Visit | Attending: Family Medicine | Admitting: Family Medicine

## 2020-04-25 ENCOUNTER — Other Ambulatory Visit: Payer: Self-pay

## 2020-04-25 DIAGNOSIS — R110 Nausea: Secondary | ICD-10-CM | POA: Diagnosis not present

## 2020-04-25 DIAGNOSIS — J189 Pneumonia, unspecified organism: Secondary | ICD-10-CM

## 2020-04-25 DIAGNOSIS — Z1389 Encounter for screening for other disorder: Secondary | ICD-10-CM | POA: Diagnosis not present

## 2020-04-25 DIAGNOSIS — R5383 Other fatigue: Secondary | ICD-10-CM | POA: Diagnosis not present

## 2020-04-25 DIAGNOSIS — R0902 Hypoxemia: Secondary | ICD-10-CM | POA: Diagnosis not present

## 2020-04-25 DIAGNOSIS — R918 Other nonspecific abnormal finding of lung field: Secondary | ICD-10-CM | POA: Diagnosis not present

## 2020-04-25 DIAGNOSIS — Z6841 Body Mass Index (BMI) 40.0 and over, adult: Secondary | ICD-10-CM | POA: Diagnosis not present

## 2020-04-28 DIAGNOSIS — N179 Acute kidney failure, unspecified: Secondary | ICD-10-CM | POA: Diagnosis not present

## 2020-04-28 DIAGNOSIS — N186 End stage renal disease: Secondary | ICD-10-CM | POA: Diagnosis not present

## 2020-04-28 DIAGNOSIS — J189 Pneumonia, unspecified organism: Secondary | ICD-10-CM | POA: Diagnosis not present

## 2020-04-29 ENCOUNTER — Inpatient Hospital Stay (HOSPITAL_COMMUNITY): Payer: Medicare Other

## 2020-04-29 ENCOUNTER — Encounter (HOSPITAL_COMMUNITY): Payer: Self-pay

## 2020-04-29 ENCOUNTER — Emergency Department (HOSPITAL_COMMUNITY): Payer: Medicare Other

## 2020-04-29 ENCOUNTER — Other Ambulatory Visit: Payer: Self-pay

## 2020-04-29 ENCOUNTER — Inpatient Hospital Stay (HOSPITAL_COMMUNITY)
Admission: EM | Admit: 2020-04-29 | Discharge: 2020-05-07 | DRG: 683 | Disposition: A | Discharge status: Home / Self Care | Payer: Medicare Other | Attending: Internal Medicine | Admitting: Internal Medicine

## 2020-04-29 DIAGNOSIS — E872 Acidosis: Secondary | ICD-10-CM | POA: Diagnosis present

## 2020-04-29 DIAGNOSIS — Z8701 Personal history of pneumonia (recurrent): Secondary | ICD-10-CM | POA: Diagnosis not present

## 2020-04-29 DIAGNOSIS — N189 Chronic kidney disease, unspecified: Secondary | ICD-10-CM | POA: Diagnosis not present

## 2020-04-29 DIAGNOSIS — T380X5A Adverse effect of glucocorticoids and synthetic analogues, initial encounter: Secondary | ICD-10-CM | POA: Diagnosis present

## 2020-04-29 DIAGNOSIS — N028 Recurrent and persistent hematuria with other morphologic changes: Secondary | ICD-10-CM | POA: Diagnosis present

## 2020-04-29 DIAGNOSIS — I10 Essential (primary) hypertension: Secondary | ICD-10-CM | POA: Diagnosis not present

## 2020-04-29 DIAGNOSIS — N179 Acute kidney failure, unspecified: Secondary | ICD-10-CM | POA: Diagnosis not present

## 2020-04-29 DIAGNOSIS — D649 Anemia, unspecified: Secondary | ICD-10-CM | POA: Diagnosis not present

## 2020-04-29 DIAGNOSIS — R319 Hematuria, unspecified: Secondary | ICD-10-CM | POA: Diagnosis not present

## 2020-04-29 DIAGNOSIS — Z6841 Body Mass Index (BMI) 40.0 and over, adult: Secondary | ICD-10-CM

## 2020-04-29 DIAGNOSIS — E063 Autoimmune thyroiditis: Secondary | ICD-10-CM | POA: Diagnosis present

## 2020-04-29 DIAGNOSIS — N281 Cyst of kidney, acquired: Secondary | ICD-10-CM | POA: Diagnosis not present

## 2020-04-29 DIAGNOSIS — Z20822 Contact with and (suspected) exposure to covid-19: Secondary | ICD-10-CM | POA: Diagnosis present

## 2020-04-29 DIAGNOSIS — Z87891 Personal history of nicotine dependence: Secondary | ICD-10-CM | POA: Diagnosis not present

## 2020-04-29 DIAGNOSIS — K219 Gastro-esophageal reflux disease without esophagitis: Secondary | ICD-10-CM | POA: Diagnosis present

## 2020-04-29 DIAGNOSIS — R82998 Other abnormal findings in urine: Secondary | ICD-10-CM | POA: Diagnosis not present

## 2020-04-29 DIAGNOSIS — E875 Hyperkalemia: Secondary | ICD-10-CM | POA: Diagnosis present

## 2020-04-29 DIAGNOSIS — R809 Proteinuria, unspecified: Secondary | ICD-10-CM | POA: Diagnosis not present

## 2020-04-29 DIAGNOSIS — N059 Unspecified nephritic syndrome with unspecified morphologic changes: Secondary | ICD-10-CM | POA: Diagnosis not present

## 2020-04-29 DIAGNOSIS — Z79899 Other long term (current) drug therapy: Secondary | ICD-10-CM | POA: Diagnosis not present

## 2020-04-29 DIAGNOSIS — N3000 Acute cystitis without hematuria: Secondary | ICD-10-CM

## 2020-04-29 DIAGNOSIS — K5730 Diverticulosis of large intestine without perforation or abscess without bleeding: Secondary | ICD-10-CM | POA: Diagnosis present

## 2020-04-29 DIAGNOSIS — K8020 Calculus of gallbladder without cholecystitis without obstruction: Secondary | ICD-10-CM | POA: Diagnosis not present

## 2020-04-29 DIAGNOSIS — N170 Acute kidney failure with tubular necrosis: Principal | ICD-10-CM | POA: Diagnosis present

## 2020-04-29 DIAGNOSIS — Z791 Long term (current) use of non-steroidal anti-inflammatories (NSAID): Secondary | ICD-10-CM

## 2020-04-29 DIAGNOSIS — R0600 Dyspnea, unspecified: Secondary | ICD-10-CM | POA: Diagnosis not present

## 2020-04-29 DIAGNOSIS — R0602 Shortness of breath: Secondary | ICD-10-CM | POA: Diagnosis not present

## 2020-04-29 DIAGNOSIS — N289 Disorder of kidney and ureter, unspecified: Secondary | ICD-10-CM | POA: Diagnosis not present

## 2020-04-29 LAB — SARS CORONAVIRUS 2 BY RT PCR (HOSPITAL ORDER, PERFORMED IN ~~LOC~~ HOSPITAL LAB): SARS Coronavirus 2: NEGATIVE

## 2020-04-29 LAB — BRAIN NATRIURETIC PEPTIDE: B Natriuretic Peptide: 44 pg/mL (ref 0.0–100.0)

## 2020-04-29 LAB — URINALYSIS, ROUTINE W REFLEX MICROSCOPIC
Bacteria, UA: NONE SEEN
Bilirubin Urine: NEGATIVE
Glucose, UA: NEGATIVE mg/dL
Ketones, ur: NEGATIVE mg/dL
Nitrite: NEGATIVE
Protein, ur: >=300 mg/dL — AB
RBC / HPF: >50 RBC/hpf — ABNORMAL HIGH (ref 0–5)
Specific Gravity, Urine: 1.013 (ref 1.005–1.030)
WBC, UA: >50 WBC/hpf — ABNORMAL HIGH (ref 0–5)
pH: 5 (ref 5.0–8.0)

## 2020-04-29 LAB — COMPREHENSIVE METABOLIC PANEL
ALT: 10 U/L (ref 0–44)
AST: 19 U/L (ref 15–41)
Albumin: 3.3 g/dL — ABNORMAL LOW (ref 3.5–5.0)
Alkaline Phosphatase: 78 U/L (ref 38–126)
Anion gap: 13 (ref 5–15)
BUN: 62 mg/dL — ABNORMAL HIGH (ref 8–23)
CO2: 21 mmol/L — ABNORMAL LOW (ref 22–32)
Calcium: 9.1 mg/dL (ref 8.9–10.3)
Chloride: 103 mmol/L (ref 98–111)
Creatinine, Ser: 7.11 mg/dL — ABNORMAL HIGH (ref 0.44–1.00)
GFR calc Af Amer: 6 mL/min — ABNORMAL LOW (ref >60)
GFR calc non Af Amer: 5 mL/min — ABNORMAL LOW (ref >60)
Glucose, Bld: 112 mg/dL — ABNORMAL HIGH (ref 70–99)
Potassium: 4.8 mmol/L (ref 3.5–5.1)
Sodium: 137 mmol/L (ref 135–145)
Total Bilirubin: 0.4 mg/dL (ref 0.3–1.2)
Total Protein: 8.5 g/dL — ABNORMAL HIGH (ref 6.5–8.1)

## 2020-04-29 LAB — CBC WITH DIFFERENTIAL/PLATELET
Abs Immature Granulocytes: 0.12 10*3/uL — ABNORMAL HIGH (ref 0.00–0.07)
Basophils Absolute: 0.1 10*3/uL (ref 0.0–0.1)
Basophils Relative: 1 %
Eosinophils Absolute: 0.5 10*3/uL (ref 0.0–0.5)
Eosinophils Relative: 4 %
HCT: 31.9 % — ABNORMAL LOW (ref 36.0–46.0)
Hemoglobin: 9.5 g/dL — ABNORMAL LOW (ref 12.0–15.0)
Immature Granulocytes: 1 %
Lymphocytes Relative: 12 %
Lymphs Abs: 1.3 10*3/uL (ref 0.7–4.0)
MCH: 29 pg (ref 26.0–34.0)
MCHC: 29.8 g/dL — ABNORMAL LOW (ref 30.0–36.0)
MCV: 97.3 fL (ref 80.0–100.0)
Monocytes Absolute: 0.9 10*3/uL (ref 0.1–1.0)
Monocytes Relative: 8 %
Neutro Abs: 8.1 10*3/uL — ABNORMAL HIGH (ref 1.7–7.7)
Neutrophils Relative %: 74 %
Platelets: 808 10*3/uL — ABNORMAL HIGH (ref 150–400)
RBC: 3.28 MIL/uL — ABNORMAL LOW (ref 3.87–5.11)
RDW: 16.6 % — ABNORMAL HIGH (ref 11.5–15.5)
WBC: 11 10*3/uL — ABNORMAL HIGH (ref 4.0–10.5)
nRBC: 0 % (ref 0.0–0.2)

## 2020-04-29 LAB — PROTEIN / CREATININE RATIO, URINE
Creatinine, Urine: 125.55 mg/dL
Protein Creatinine Ratio: 0.37 mg/mg{Cre} — ABNORMAL HIGH (ref 0.00–0.15)
Total Protein, Urine: 47 mg/dL

## 2020-04-29 MED ORDER — ACETAMINOPHEN 325 MG PO TABS
650.0000 mg | ORAL_TABLET | Freq: Four times a day (QID) | ORAL | Status: DC | PRN
  Administered 2020-05-01 – 2020-05-03 (×5): 650 mg via ORAL
  Filled 2020-04-29 (×5): qty 2

## 2020-04-29 MED ORDER — SODIUM CHLORIDE 0.9 % IV SOLN
1.0000 g | Freq: Once | INTRAVENOUS | Status: AC
  Administered 2020-04-29: 1 g via INTRAVENOUS
  Filled 2020-04-29: qty 10

## 2020-04-29 MED ORDER — SODIUM CHLORIDE 0.9 % IV BOLUS
1000.0000 mL | Freq: Once | INTRAVENOUS | Status: AC
  Administered 2020-04-29: 1000 mL via INTRAVENOUS

## 2020-04-29 MED ORDER — ACETAMINOPHEN 650 MG RE SUPP: 650.0000 mg | Freq: Four times a day (QID) | RECTAL | Status: DC | PRN

## 2020-04-29 MED ORDER — RALOXIFENE HCL 60 MG PO TABS
60.0000 mg | ORAL_TABLET | Freq: Every day | ORAL | Status: DC
  Administered 2020-05-01 – 2020-05-07 (×7): 60 mg via ORAL
  Filled 2020-04-29 (×8): qty 1

## 2020-04-29 MED ORDER — ONDANSETRON HCL 4 MG PO TABS
4.0000 mg | ORAL_TABLET | Freq: Four times a day (QID) | ORAL | Status: DC | PRN
  Filled 2020-04-29: qty 1

## 2020-04-29 MED ORDER — ONDANSETRON HCL 4 MG/2ML IJ SOLN
4.0000 mg | Freq: Four times a day (QID) | INTRAMUSCULAR | Status: DC | PRN
  Administered 2020-05-06: 4 mg via INTRAVENOUS
  Filled 2020-04-29: qty 2

## 2020-04-29 MED ORDER — POLYETHYLENE GLYCOL 3350 17 G PO PACK: 17.0000 g | PACK | Freq: Every day | ORAL | Status: DC | PRN

## 2020-04-29 NOTE — Consult Note (Addendum)
[facility] Renal Consultation Note  Requesting MD: Jenetta Downer MD Indication for Consultation:  AKI  Chief complaint: outpatient labs and shortness of breath  HPI:  Cheryl [name] is a 74 y.o. female with a history of GERD, arthritis, prior sleep apnea, and constipation who presented to the hospital after outpatient labs demonstrated worsening renal failure.  The patient states that she has been seeing her PCP frequently as she had a recent pneumonia approximately a month ago.  She had labs on 5/7 and on 5/10 and was directed today to come to the ER.  She sees [facility].  She indicates that she has had nausea but no vomiting.  She has had no abdominal pain.  She states that food tastes funny and mouth has a metallic sort of taste.  She is urinating without difficulty but states that she thinks her urine is thicker today and a light tea color.  She denies any foamy urine or bubbly urine.  She has not coughed up any blood.  She did have a cough and states that this mostly resolved about a week ago.  She has had shortness of breath for the past month and that is actually better today.  She states she had low oxygen levels in the 80s and states that she had been ordered for home oxygen but had not yet received this.  She has been on Excedrin 2 a day for about 3 days.  She was on Celebrex years ago.  She states that normally her blood pressure is under 130/70 so the values today are typically high for her.  She has a sister who had kidney failure but was not on dialysis and she passed away.  Her paternal aunt was on dialysis and she is deceased; they both had diabetes.  Pt denies any hx of DM.  Patient denies history of heart attack, stroke and states that she is not on a blood thinner.  We discussed the risks and benefits and indications of dialysis and she does want dialysis if indicated.  We discussed that right now there is no emergent indication for dialysis.   We discussed the risks and benefits and indications of a renal biopsy and she does consent to a renal biopsy.  UA with positive leuks, over 50 WBC, no bacteria.  She had a CT renal stone study which demonstrated enlarging low density renal lesions bilaterally likely all cysts (3-4 on right and 5-6 on left).  They measure up to 3.5 cm on right and 3 cm on left.  There may be a small window for biopsy.  No evidence of hydro.  Scan also noted bronchiectasis and progressive chronic lung disease.  covid screen negative   Creat  Date/Time Value Ref Range Status  03/23/2013 12:00 AM 0.98  Final   Creatinine, Ser  Date/Time Value Ref Range Status  04/29/2020 02:24 PM 7.11 (H) 0.44 - 1.00 mg/dL Final  03/28/2020 05:05 AM 1.40 (H) 0.44 - 1.00 mg/dL Final  03/27/2020 02:53 PM 1.52 (H) 0.44 - 1.00 mg/dL Final  11/19/2016 11:44 AM 1.17 (H) 0.44 - 1.00 mg/dL Final  01/14/2015 10:35 AM 1.18 (H) 0.50 - 1.10 mg/dL Final  02/19/2012 01:35 AM 0.96 0.50 - 1.10 mg/dL Final     PMHx:   Past Medical History:  Diagnosis Date  . Acid reflux   . Arthritis   . Neuromuscular disorder ([place])   . Sinus drainage   . Sleep apnea    had surgery to  correct    Past Surgical History:  Procedure Laterality Date  . ANTERIOR CERVICAL DECOMP/DISCECTOMY FUSION N/A 11/19/2016   Procedure: ANTERIOR CERVICAL DISCECTOMY AND FUSION C5-6 WITH PLATES, SCREWS, CAGE, VIVIGEN II;  Surgeon: Jessy Oto, MD;  Location: [facility];  Service: Orthopedics;  Laterality: N/A;  . CARPAL TUNNEL RELEASE    . CATARACT EXTRACTION, BILATERAL Bilateral 2017   One in December 2017 and one in January 2018  . COLONOSCOPY  02/15/2005   YDX:AJOIN small polyps ablated via cold biopsy, one from transverse colon and two from the rectum/Small external hemorrhoids  . COLONOSCOPY  07/10/2010   OMV:EHMCNO rectum/long redundant colon, polyps in the sigmoid, descending, hepatic flexure/ADENOMATOUS POLYPS. next TCS due 06/2015  . COLONOSCOPY  1995   3  polyps, path revealed chronic colitis  . COLONOSCOPY N/A 08/05/2015   Procedure: COLONOSCOPY;  Surgeon: Daneil Dolin, MD;  [facility];  Service: Endoscopy;  Laterality: N/A;  1000  . ESOPHAGOGASTRODUODENOSCOPY  1995   Gastritis  . ESOPHAGOGASTRODUODENOSCOPY N/A 04/16/2013   RMR: HH  . ESOPHAGOGASTRODUODENOSCOPY N/A 05/13/2016   Procedure: ESOPHAGOGASTRODUODENOSCOPY (EGD);  Surgeon: Daneil Dolin, MD;  [facility];  Service: Endoscopy;  Laterality: N/A;  145  . ESOPHAGOGASTRODUODENOSCOPY N/A 08/18/2016   Procedure: ESOPHAGOGASTRODUODENOSCOPY (EGD);  Surgeon: Daneil Dolin, MD;  [facility];  Service: Endoscopy;  Laterality: N/A;  7:45 AM  . KNEE SURGERY     x2  . ROTATOR CUFF REPAIR     x2  . SHOULDER ARTHROSCOPY WITH ROTATOR CUFF REPAIR AND SUBACROMIAL DECOMPRESSION Right 01/21/2015   Procedure: RIGHT SHOULDER ARTHROSCOPIC DEBRIDEMNT OF G-H JOINT AND REMOVAL OF LOOSE BODIES,ARTHROSCOPIC SUBACROMIAL DECOMPRESSION,MINI OPEN RCT REPAIR WITH SUPPLEMENTAL [facility] PATCH;  Surgeon: Garald Balding, MD;  Location: [facility];  Service: Orthopedics;  Laterality: Right;  . TONSILLECTOMY    . TRIGGER FINGER RELEASE    . UVULOPALATOPHARYNGOPLASTY      Family Hx:  Family History  Problem Relation Age of Onset  . Cancer Mother   . Cancer Father   . Diabetes Sister   . Colon cancer Neg Hx   . Liver disease Neg Hx     Social History:  reports that she quit smoking about 14 years ago. Her smoking use included cigarettes. She has a 4.80 pack-year smoking history. She has never used smokeless tobacco. She reports that she does not drink alcohol or use drugs.  Allergies:  Allergies  Allergen Reactions  . Prednisone Other (See Comments)    Hallucinations   . Tramadol Nausea Only  . Aspirin Nausea And Vomiting  . Celecoxib Rash    Medications: Prior to Admission medications   Medication Sig Start Date End Date Taking? Authorizing Provider  acetaminophen  (TYLENOL) 500 MG tablet Take 500-1,000 mg by mouth every 6 (six) hours as needed (for pain/headache).   Yes [provider]  albuterol (VENTOLIN HFA) 108 (90 Base) MCG/ACT inhaler Inhale 2 puffs into the lungs every 6 (six) hours as needed for wheezing or shortness of breath. 03/29/20  Yes Emokpae, Courage, MD  buPROPion (WELLBUTRIN XL) 300 MG 24 hr tablet Take 300 mg by mouth daily. 05/15/19  Yes [provider]  CALCIUM-VITAMIN D PO Take 5 mLs by mouth every morning.    Yes [provider]  Cholecalciferol (VITAMIN D3) 125 MCG (5000 UT) CAPS Take 1 capsule by mouth daily.   Yes [provider]  diclofenac sodium (VOLTAREN) 1 % GEL Apply 2-4 g topically 4 (  four) times daily. Patient taking differently: Apply 2-4 g topically 4 (four) times daily as needed (for pain).  08/02/17  Yes Petrarca, Mike Craze, PA-C  guaiFENesin (MUCINEX) 600 MG 12 hr tablet Take 1 tablet (600 mg total) by mouth 2 (two) times daily. Patient taking differently: Take 600 mg by mouth 2 (two) times daily as needed for cough or to loosen phlegm.  03/29/20  Yes Roxan Hockey, MD  linaclotide (LINZESS) 290 MCG CAPS capsule Take 1 capsule (290 mcg total) by mouth daily before breakfast. 10/30/19  Yes Jodi Mourning, Kristen S, PA-C  montelukast (SINGULAIR) 10 MG tablet Take 1 tablet (10 mg total) by mouth every morning. 03/29/20  Yes Emokpae, Courage, MD  nystatin (MYCOSTATIN) 100000 UNIT/ML suspension Take 5 mLs by mouth 2 (two) times daily as needed (for thrush).  02/22/20  Yes [provider]  ondansetron (ZOFRAN) 4 MG tablet Take 4 mg by mouth every 8 (eight) hours as needed for nausea or vomiting.  04/25/20  Yes [provider]  pantoprazole (PROTONIX) 40 MG tablet Take 1 tablet (40 mg total) by mouth daily. 02/18/17  Yes Philemon Kingdom, MD  polyethylene glycol (MIRALAX / GLYCOLAX) packet Take 17 g by mouth daily as needed for mild constipation or moderate constipation.    Yes [provider]  raloxifene (EVISTA) 60 MG tablet Take 60 mg by mouth daily.   Yes [provider]  rOPINIRole (REQUIP) 0.25 MG tablet Take 0.75 mg by mouth daily as needed (for rls).  12/12/19  Yes [provider]  tolterodine (DETROL) 1 MG tablet Take 1 mg by mouth 2 (two) times daily.   Yes [provider]  vitamin B-12 (CYANOCOBALAMIN) 1000 MCG tablet Take 1,000 mcg by mouth daily.   Yes [provider]    I have reviewed the patient's current and reported prior to admission medications.  Labs:  BMP Latest Ref Rng & Units 04/29/2020 03/28/2020 03/27/2020  Glucose 70 - 99 mg/dL 112(H) 107(H) 107(H)  BUN 8 - 23 mg/dL 62(H) 17 21  Creatinine 0.44 - 1.00 mg/dL 7.11(H) 1.40(H) 1.52(H)  Sodium 135 - 145 mmol/L 137 138 138  Potassium 3.5 - 5.1 mmol/L 4.8 3.9 4.0  Chloride 98 - 111 mmol/L 103 105 105  CO2 22 - 32 mmol/L 21(L) 24 22  Calcium 8.9 - 10.3 mg/dL 9.1 8.9 9.2    Urinalysis    Component Value Date/Time   COLORURINE YELLOW 04/29/2020 1421   APPEARANCEUR CLOUDY (A) 04/29/2020 1421   LABSPEC 1.013 04/29/2020 1421   PHURINE 5.0 04/29/2020 1421   GLUCOSEU NEGATIVE 04/29/2020 1421   HGBUR LARGE (A) 04/29/2020 1421   BILIRUBINUR NEGATIVE 04/29/2020 1421   KETONESUR NEGATIVE 04/29/2020 1421   PROTEINUR >=300 (A) 04/29/2020 1421   UROBILINOGEN 0.2 02/18/2012 2113   NITRITE NEGATIVE 04/29/2020 1421   LEUKOCYTESUR MODERATE (A) 04/29/2020 1421     ROS:  Pertinent items noted in HPI and remainder of comprehensive ROS otherwise negative.  Physical Exam: Vitals:   04/29/20 1313  BP: 140/84  Pulse: 96  Resp: 18  Temp: 97.9 F (36.6 C)  SpO2: 96%     General: Adult female in stretcher in no acute distress at rest HEENT: Normocephalic atraumatic Eyes: Extraocular movements intact sclera anicteric Neck: Supple trachea midline Heart: S1-S2 no rub appreciated Lungs: Her lungs are clear to auscultation bilaterally.  She is on room air on my exam.   Unlabored at rest Abdomen: Soft nontender nondistended Extremities: No edema appreciated no cyanosis or  clubbing Skin: No rash on extremities exposed Neuro: Alert and oriented x3 provides a history and follows commands Psych normal mood and affect   Assessment/Plan:  # AKI  - Concern for GN with AKI, hematuria, and proteinuria.  Note also recent NSAID's - Recommend transfer to [facility] and renal biopsy.  She has agreed.  May ultimately need pheresis if a pulmonary-renal syndrome.  Spoke with team and they are transferring  - Send ANA, ANCA/MPO & PR3 ab, complement, anti-GBM, SPEP, free light chains - Renal panel in AM - Check post-void residual bladder scan and place foley if 250 mL retained -She has received a liter of normal saline here in the ER.  Defer additional fluids for now - Will need to discuss with radiology - I have paged IR to discuss to determine if there is a window for a biopsy - Please hold her PPI and any non-essential meds - noted she was made NPO after midnight tonight   # Proteinuria - quantify urine protein/cr ratio  - GN w/u as above   # Hematuria -GN work-up as above   # Dyspnea  - Recent dx of multifocal PNA and note finding of bronchiectasis on CT scan  - Concern for possible pulm-renal syndrome as above   # HTN  - new diagnosis per patient   # Anemia normocytic - no acute indication for PRBC's   # Leukocyturia - Note urine culture in process; she received ceftriaxone  - reassess bx timing per nephrology at [facility] per cultures  # Renal cysts  - noted as above.  No family hx of PKD and simple per last scan several years ago but larger now.  Claudia Desanctis 04/29/2020, 7:06 PM  Spoke with radiology and IR tonight and there should be a window for a renal biopsy but IR not able to see imaging tonight.  Will need to discuss with IR on 5/12.  No family hx of PKD per pt.  Discussed findings with pt as well.  For now I will obtain a renal ultrasound for  additional data re: echogenicity  Claudia Desanctis, MD 04/29/2020 7:22 PM

## 2020-04-29 NOTE — ED Provider Notes (Signed)
[facility] Provider Note   CSN: [id number] Arrival date & time: 04/29/20  1302     History Chief Complaint  Patient presents with  . Shortness of Breath    Cheryl [name] is a 74 y.o. female.  HPI       74 year old female, PMH of acid reflux, arthritis, neuromuscular disorder, presents with abnormal labs.  Per patient she was admitted for multifocal pneumonia approximately a month ago.  Since then she has had gradual worsening shortness of breath.  She denies any fevers, chills, cough, myalgias.  She has been followed by her primary care doctor.  She notes that she has been on the multiple antibiotics but is unsure of which ones.  Her primary care doctor has been checking her labs and patient was noted to have elevated creatinine.  She was sent to the ED for further evaluation.  Patient notes that she has been getting intermittent abdominal pain which she describes as "a sour feeling" across her abdomen.  She denies any abdominal pain currently.  She notes urinary frequency and denies any dysuria, urinary urgency.  Denies any nausea, vomiting, decreased p.o. intake.   Past Medical History:  Diagnosis Date  . Acid reflux   . Arthritis   . Neuromuscular disorder ([place])   . Sinus drainage   . Sleep apnea    had surgery to correct    Patient Active Problem List   Diagnosis Date Noted  . AKI (acute kidney injury) ([place]) 03/28/2020  . Pneumonia 03/27/2020  . Herniated cervical intervertebral disc 11/19/2016  . Cervical disc disorder at C5-C6 level with radiculopathy 10/21/2016  . Gastric ulceration   . GERD (gastroesophageal reflux disease) 05/11/2016  . Constipation 05/11/2016  . Hashimoto's thyroiditis 02/20/2016  . History of colonic polyps   . Diverticulosis of colon without hemorrhage   . Complete tear of right rotator cuff 01/21/2015  . Severe obesity (BMI >= 40) ([place]) 01/21/2015  . Nausea without vomiting 04/04/2013  . H/O adenomatous polyp of  colon 04/04/2013  . Fatty liver 11/11/2010  . RUQ PAIN 11/11/2010  . NONSPEC ELEVATION OF LEVELS OF TRANSAMINASE/LDH 11/11/2010  . COLONIC POLYPS, HYPERPLASTIC, HX OF 06/25/2010  . ANEMIA-NOS 06/24/2010    Past Surgical History:  Procedure Laterality Date  . ANTERIOR CERVICAL DECOMP/DISCECTOMY FUSION N/A 11/19/2016   Procedure: ANTERIOR CERVICAL DISCECTOMY AND FUSION C5-6 WITH PLATES, SCREWS, CAGE, VIVIGEN II;  Surgeon: Jessy Oto, MD;  Location: [facility];  Service: Orthopedics;  Laterality: N/A;  . CARPAL TUNNEL RELEASE    . CATARACT EXTRACTION, BILATERAL Bilateral 2017   One in December 2017 and one in January 2018  . COLONOSCOPY  02/15/2005   LKG:MWNUU small polyps ablated via cold biopsy, one from transverse colon and two from the rectum/Small external hemorrhoids  . COLONOSCOPY  07/10/2010   VOZ:DGUYQI rectum/long redundant colon, polyps in the sigmoid, descending, hepatic flexure/ADENOMATOUS POLYPS. next TCS due 06/2015  . COLONOSCOPY  1995   3 polyps, path revealed chronic colitis  . COLONOSCOPY N/A 08/05/2015   Procedure: COLONOSCOPY;  Surgeon: Daneil Dolin, MD;  [facility];  Service: Endoscopy;  Laterality: N/A;  1000  . ESOPHAGOGASTRODUODENOSCOPY  1995   Gastritis  . ESOPHAGOGASTRODUODENOSCOPY N/A 04/16/2013   RMR: HH  . ESOPHAGOGASTRODUODENOSCOPY N/A 05/13/2016   Procedure: ESOPHAGOGASTRODUODENOSCOPY (EGD);  Surgeon: Daneil Dolin, MD;  [facility];  Service: Endoscopy;  Laterality: N/A;  145  . ESOPHAGOGASTRODUODENOSCOPY N/A 08/18/2016   Procedure: ESOPHAGOGASTRODUODENOSCOPY (EGD);  Surgeon: Daneil Dolin, MD;  [facility];  Service: Endoscopy;  Laterality: N/A;  7:45 AM  . KNEE SURGERY     x2  . ROTATOR CUFF REPAIR     x2  . SHOULDER ARTHROSCOPY WITH ROTATOR CUFF REPAIR AND SUBACROMIAL DECOMPRESSION Right 01/21/2015   Procedure: RIGHT SHOULDER ARTHROSCOPIC DEBRIDEMNT OF G-H JOINT AND REMOVAL OF LOOSE BODIES,ARTHROSCOPIC SUBACROMIAL  DECOMPRESSION,MINI OPEN RCT REPAIR WITH SUPPLEMENTAL [facility] PATCH;  Surgeon: Garald Balding, MD;  Location: [facility];  Service: Orthopedics;  Laterality: Right;  . TONSILLECTOMY    . TRIGGER FINGER RELEASE    . UVULOPALATOPHARYNGOPLASTY       OB History   No obstetric history on file.     Family History  Problem Relation Age of Onset  . Cancer Mother   . Cancer Father   . Diabetes Sister   . Colon cancer Neg Hx   . Liver disease Neg Hx     Social History   Tobacco Use  . Smoking status: Former Smoker    Packs/day: 0.10    Years: 48.00    Pack years: 4.80    Types: Cigarettes    Quit date: 05/11/2005    Years since quitting: 14.9  . Smokeless tobacco: Never Used  Substance Use Topics  . Alcohol use: No    Alcohol/week: 0.0 standard drinks  . Drug use: No    Home Medications Prior to Admission medications   Medication Sig Start Date End Date Taking? Authorizing Provider  acetaminophen (TYLENOL) 500 MG tablet Take 500-1,000 mg by mouth every 6 (six) hours as needed (for pain/headache).    [provider]  albuterol (VENTOLIN HFA) 108 (90 Base) MCG/ACT inhaler Inhale 2 puffs into the lungs every 6 (six) hours as needed for wheezing or shortness of breath. 03/29/20   Roxan Hockey, MD  allopurinol (ZYLOPRIM) 300 MG tablet Take 300 mg by mouth at bedtime.    [provider]  Biotin 5000 MCG TABS Take 5,000 mcg by mouth daily as needed (hair loss).     [provider]  buPROPion (WELLBUTRIN XL) 300 MG 24 hr tablet Take 300 mg by mouth daily. 05/15/19   [provider]  CALCIUM-VITAMIN D PO Take 1 tablet by mouth every morning.    [provider]  diclofenac sodium (VOLTAREN) 1 % GEL Apply 2-4 g topically 4 (four) times daily. 08/02/17   Cherylann Ratel, PA-C  febuxostat (ULORIC) 40 MG tablet Take 40 mg by mouth at bedtime.     [provider]  fluticasone (FLONASE) 50 MCG/ACT nasal spray Place 1 spray into both  nostrils daily as needed for allergies (sinuses).    [provider]  guaiFENesin (MUCINEX) 600 MG 12 hr tablet Take 1 tablet (600 mg total) by mouth 2 (two) times daily. 03/29/20   Roxan Hockey, MD  linaclotide Rolan Lipa) 290 MCG CAPS capsule Take 1 capsule (290 mcg total) by mouth daily before breakfast. 10/30/19   Erenest Rasher, PA-C  montelukast (SINGULAIR) 10 MG tablet Take 1 tablet (10 mg total) by mouth every morning. 03/29/20   Roxan Hockey, MD  nystatin (MYCOSTATIN) 100000 UNIT/ML suspension Take 5 mLs by mouth 2 (two) times daily.  02/22/20   [provider]  pantoprazole (PROTONIX) 40 MG tablet Take 1 tablet (40 mg total) by mouth daily. 02/18/17   Philemon Kingdom, MD  polyethylene glycol (MIRALAX / GLYCOLAX) packet Take 17 g by mouth daily as needed.    [provider]  raloxifene (EVISTA) 60 MG tablet Take 60 mg by mouth daily.    [provider]  rOPINIRole (REQUIP) 0.25 MG tablet Take 0.75 mg by mouth daily. 12/12/19   [provider]  tolterodine (DETROL) 1 MG tablet Take 1 mg by mouth 2 (two) times daily.    [provider]  vitamin B-12 (CYANOCOBALAMIN) 1000 MCG tablet Take 1,000 mcg by mouth daily.    [provider]    Allergies    Prednisone, Tramadol, Aspirin, and Celecoxib  Review of Systems   Review of Systems  Constitutional: Negative for chills and fever.  HENT: Negative for rhinorrhea and sore throat.   Eyes: Negative for visual disturbance.  Respiratory: Positive for shortness of breath. Negative for cough.   Cardiovascular: Negative for chest pain and leg swelling.  Gastrointestinal: Positive for abdominal pain. Negative for diarrhea, nausea and vomiting.  Genitourinary: Positive for frequency. Negative for dysuria and urgency.  Musculoskeletal: Negative for joint swelling and neck pain.  Skin: Negative for rash and wound.  Neurological: Negative for syncope and numbness.  All other systems  reviewed and are negative.   Physical Exam Updated Vital Signs BP 140/84 (BP Location: Right Arm)   Pulse 96   Temp 97.9 F (36.6 C) (Oral)   Resp 18   Ht 4\' 11"  (1.499 m)   Wt 90.3 kg   SpO2 96%   BMI 40.19 kg/m   Physical Exam Vitals and nursing note reviewed.  Constitutional:      Appearance: She is well-developed.  HENT:     Head: Normocephalic and atraumatic.  Eyes:     Conjunctiva/sclera: Conjunctivae normal.  Cardiovascular:     Rate and Rhythm: Normal rate and regular rhythm.     Heart sounds: Normal heart sounds. No murmur.  Pulmonary:     Effort: Pulmonary effort is normal. No respiratory distress.     Breath sounds: Normal breath sounds. No wheezing or rales.  Abdominal:     General: Bowel sounds are normal. There is no distension.     Palpations: Abdomen is soft.     Tenderness: There is no abdominal tenderness. Negative signs include Murphy's sign and McBurney's sign.  Musculoskeletal:        General: No tenderness or deformity. Normal range of motion.     Cervical back: Neck supple.  Skin:    General: Skin is warm and dry.     Findings: No erythema or rash.  Neurological:     Mental Status: She is alert and oriented to person, place, and time.  Psychiatric:        Behavior: Behavior normal.     ED Results / Procedures / Treatments   Labs (all labs ordered are listed, but only abnormal results are displayed) Labs Reviewed  SARS CORONAVIRUS 2 BY RT PCR (HOSPITAL ORDER, [place] [facility])  CBC WITH DIFFERENTIAL/PLATELET  COMPREHENSIVE METABOLIC PANEL  URINALYSIS, ROUTINE W REFLEX MICROSCOPIC  BRAIN NATRIURETIC PEPTIDE    EKG EKG Interpretation  Date/Time:  Tuesday Apr 29 2020 13:08:28 EDT Ventricular Rate:  100 PR Interval:  106 QRS Duration: 68 QT Interval:  326 QTC Calculation: 420 R Axis:   14 Text Interpretation: Sinus rhythm with short PR Otherwise normal ECG No significant change since last tracing Confirmed by  Isla Pence [phone]) on 04/29/2020 2:06:32 PM   Radiology DG Chest 2 View  Result Date: 04/29/2020 CLINICAL DATA:  Shortness of breath EXAM: CHEST - 2 VIEW COMPARISON:  04/25/2020  FINDINGS: Ill-defined pulmonary opacities are again noted within the lungs, stable are slightly increased since prior study. Heart is normal size. Linear scarring or atelectasis in the lingula. No effusions or pneumothorax. No acute bony abnormality. IMPRESSION: Ill-defined opacities throughout the lungs, stable or slightly worsened since prior study. Electronically Signed   By: Rolm Baptise M.D.   On: 04/29/2020 14:02    Procedures Procedures (including critical care time)  Medications Ordered in ED Medications  sodium chloride 0.9 % bolus 1,000 mL (1,000 mLs Intravenous New Bag/Given 04/29/20 1412)    ED Course  I have reviewed the triage vital signs and the nursing notes.  Pertinent labs & imaging results that were available during my care of the patient were reviewed by me and considered in my medical decision making (see chart for details).    MDM Rules/Calculators/A&P                         Patient has a copy of her lab work from 04/28/2020.  Hemoglobin 9.2, platelets 809, creatinine 6.54   Patient presents with abnormal labs.  She does endorse worsening shortness of breath and a "sour feeling in her abdomen.  Faint rales in bilateral bases.  Abdomen soft and nontender to palpation.  Chest x-ray stable versus worsening, she denies any fevers, chills, cough, myalgias.  Abdominal CT shows lung disease, fluid-filled bowel, no evidence of obstruction.  Blood work shows anemia, slightly worse from hospitalization a month ago, slightly elevated white count.  Creatinine significantly worse at 7.11.  Potassium normal.  Patient still producing urine.  Urine suggestive of UTI especially given urinary frequency.  Urine sent for culture.  Patient given a liter of fluids and ceftriaxone.  Discussed admission with  the hospitalist, Dr. Earlean Shawl will see and evaluate the patient.    Cheryl [name] was evaluated in Emergency Department on 04/29/2020 for the symptoms described in the history of present illness. She was evaluated in the context of the global COVID-19 pandemic, which necessitated consideration that the patient might be at risk for infection with the SARS-CoV-2 virus that causes COVID-19. Institutional protocols and algorithms that pertain to the evaluation of patients at risk for COVID-19 are in a state of rapid change based on information released by regulatory bodies including the CDC and federal and state organizations. These policies and algorithms were followed during the patient's care in the ED.     Final Clinical Impression(s) / ED Diagnoses Final diagnoses:  None    Rx / DC Orders ED Discharge Orders    None       Rachel Moulds 04/29/20 2211    Isla Pence, MD 04/30/20 [phone]

## 2020-04-29 NOTE — ED Notes (Signed)
Report given to [facility]. 

## 2020-04-29 NOTE — ED Triage Notes (Signed)
Pt presents to ED with continued SOB x 1 month. Pt states she was in the hospital 1 month ago and that's when it started. Pt states SOB has gotten worse. O2 sat in triage 96% on RA

## 2020-04-29 NOTE — H&P (Addendum)
History and Physical    Cheryl [name] SLH:[id number] DOB: 1946/10/19 DOA: 04/29/2020  PCP: Sharilyn Sites, MD   Patient coming from: Home  I have personally briefly reviewed patient's old medical records in [place]  Chief Complaint: Difficulty breathing  HPI: Cheryl [name] is a 74 y.o. female with medical history significant for Hashimoto's, diverticulosis.  Patient presented to the ED with complaints of persistent and gradually worsening difficulty breathing over the past month since she was hospitalized.  She reports persistent dry cough.  No fevers no chills.  No chest pain.  No lower extremity swelling. Patient saw her primary care provider, and was referred to the ED due to elevated creatinine. She denies nausea, vomiting or loose stools, she reports stable oral intake, she denies NSAID use.  She denies diuretic use.  No family history of lung disease that she is aware of.  She reports generalized abdominal queasiness over the past 1 to 2 weeks.  She denies urinary frequency or dysuria.  Recent hospitalization- 4/8- 4/10, treated for multifocal pneumonia with acute hypoxic respiratory failure, treated with ceftriaxone and azithromycin, inpatient and discharged on a Omnicef and azithromycin.  She did not require supplemental oxygen on discharge.   ED Course: Stable vitals.  Creatinine elevated at 7.11.  Unremarkable BNP 44.  Platelets elevated at 808.  Hemoglobin low at 9.5.  Likely large hemoglobin, protein, positive leukocytes, greater than 50 WBCs, no bacteria.  2 view chest xray shows ill-defined opacities throughout the lungs, stable or slightly worsened since prior study.  1 L bolus normal saline given, IV ceftriaxone started.  Hospitalist to admit for acute kidney injury.  Review of Systems: As per HPI all other systems reviewed and negative.  Past Medical History:  Diagnosis Date   Acid reflux    Arthritis    Neuromuscular disorder ([place])    Sinus drainage    Sleep  apnea    had surgery to correct    Past Surgical History:  Procedure Laterality Date   ANTERIOR CERVICAL DECOMP/DISCECTOMY FUSION N/A 11/19/2016   Procedure: ANTERIOR CERVICAL DISCECTOMY AND FUSION C5-6 WITH PLATES, SCREWS, CAGE, VIVIGEN II;  Surgeon: Jessy Oto, MD;  Location: [place];  Service: Orthopedics;  Laterality: N/A;   CARPAL TUNNEL RELEASE     CATARACT EXTRACTION, BILATERAL Bilateral 2017   One in December 2017 and one in January 2018   COLONOSCOPY  02/15/2005   LXB:WIOMB small polyps ablated via cold biopsy, one from transverse colon and two from the rectum/Small external hemorrhoids   COLONOSCOPY  07/10/2010   TDH:RCBULA rectum/long redundant colon, polyps in the sigmoid, descending, hepatic flexure/ADENOMATOUS POLYPS. next TCS due 06/2015   COLONOSCOPY  1995   3 polyps, path revealed chronic colitis   COLONOSCOPY N/A 08/05/2015   Procedure: COLONOSCOPY;  Surgeon: Daneil Dolin, MD;  [facility];  Service: Endoscopy;  Laterality: N/A;  1000   ESOPHAGOGASTRODUODENOSCOPY  1995   Gastritis   ESOPHAGOGASTRODUODENOSCOPY N/A 04/16/2013   RMR: HH   ESOPHAGOGASTRODUODENOSCOPY N/A 05/13/2016   Procedure: ESOPHAGOGASTRODUODENOSCOPY (EGD);  Surgeon: Daneil Dolin, MD;  [facility];  Service: Endoscopy;  Laterality: N/A;  145   ESOPHAGOGASTRODUODENOSCOPY N/A 08/18/2016   Procedure: ESOPHAGOGASTRODUODENOSCOPY (EGD);  Surgeon: Daneil Dolin, MD;  [facility];  Service: Endoscopy;  Laterality: N/A;  7:45 AM   KNEE SURGERY     x2   ROTATOR CUFF REPAIR     x2   SHOULDER ARTHROSCOPY WITH ROTATOR CUFF  REPAIR AND SUBACROMIAL DECOMPRESSION Right 01/21/2015   Procedure: RIGHT SHOULDER ARTHROSCOPIC DEBRIDEMNT OF G-H JOINT AND REMOVAL OF LOOSE BODIES,ARTHROSCOPIC SUBACROMIAL DECOMPRESSION,MINI OPEN RCT REPAIR WITH SUPPLEMENTAL [facility] PATCH;  Surgeon: Garald Balding, MD;  Location: [place];  Service: Orthopedics;  Laterality: Right;    TONSILLECTOMY     TRIGGER FINGER RELEASE     UVULOPALATOPHARYNGOPLASTY       reports that she quit smoking about 14 years ago. Her smoking use included cigarettes. She has a 4.80 pack-year smoking history. She has never used smokeless tobacco. She reports that she does not drink alcohol or use drugs.  Allergies  Allergen Reactions   Prednisone Other (See Comments)    Hallucinations    Tramadol Nausea Only   Aspirin Nausea And Vomiting   Celecoxib Rash    Family History  Problem Relation Age of Onset   Cancer Mother    Cancer Father    Diabetes Sister    Colon cancer Neg Hx    Liver disease Neg Hx     Prior to Admission medications   Medication Sig Start Date End Date Taking? Authorizing Provider  acetaminophen (TYLENOL) 500 MG tablet Take 500-1,000 mg by mouth every 6 (six) hours as needed (for pain/headache).   Yes [provider]  albuterol (VENTOLIN HFA) 108 (90 Base) MCG/ACT inhaler Inhale 2 puffs into the lungs every 6 (six) hours as needed for wheezing or shortness of breath. 03/29/20  Yes Daine Croker, Courage, MD  buPROPion (WELLBUTRIN XL) 300 MG 24 hr tablet Take 300 mg by mouth daily. 05/15/19  Yes [provider]  CALCIUM-VITAMIN D PO Take 5 mLs by mouth every morning.    Yes [provider]  Cholecalciferol (VITAMIN D3) 125 MCG (5000 UT) CAPS Take 1 capsule by mouth daily.   Yes [provider]  diclofenac sodium (VOLTAREN) 1 % GEL Apply 2-4 g topically 4 (four) times daily. Patient taking differently: Apply 2-4 g topically 4 (four) times daily as needed (for pain).  08/02/17  Yes Petrarca, Mike Craze, PA-C  guaiFENesin (MUCINEX) 600 MG 12 hr tablet Take 1 tablet (600 mg total) by mouth 2 (two) times daily. Patient taking differently: Take 600 mg by mouth 2 (two) times daily as needed for cough or to loosen phlegm.  03/29/20  Yes Roxan Hockey, MD  linaclotide (LINZESS) 290 MCG CAPS capsule Take 1 capsule (290 mcg total) by mouth  daily before breakfast. 10/30/19  Yes Jodi Mourning, Kristen S, PA-C  montelukast (SINGULAIR) 10 MG tablet Take 1 tablet (10 mg total) by mouth every morning. 03/29/20  Yes Natasha Paulson, Courage, MD  nystatin (MYCOSTATIN) 100000 UNIT/ML suspension Take 5 mLs by mouth 2 (two) times daily as needed (for thrush).  02/22/20  Yes [provider]  ondansetron (ZOFRAN) 4 MG tablet Take 4 mg by mouth every 8 (eight) hours as needed for nausea or vomiting.  04/25/20  Yes [provider]  pantoprazole (PROTONIX) 40 MG tablet Take 1 tablet (40 mg total) by mouth daily. 02/18/17  Yes Philemon Kingdom, MD  polyethylene glycol (MIRALAX / GLYCOLAX) packet Take 17 g by mouth daily as needed for mild constipation or moderate constipation.    Yes [provider]  raloxifene (EVISTA) 60 MG tablet Take 60 mg by mouth daily.   Yes [provider]  rOPINIRole (REQUIP) 0.25 MG tablet Take 0.75 mg by mouth daily as needed (for rls).  12/12/19  Yes [provider]  tolterodine (DETROL) 1 MG tablet Take 1 mg by  mouth 2 (two) times daily.   Yes [provider]  vitamin B-12 (CYANOCOBALAMIN) 1000 MCG tablet Take 1,000 mcg by mouth daily.   Yes [provider]    Physical Exam: Vitals:   04/29/20 1313  BP: 140/84  Pulse: 96  Resp: 18  Temp: 97.9 F (36.6 C)  TempSrc: Oral  SpO2: 96%  Weight: 90.3 kg  Height: 4\' 11"  (1.499 m)    Constitutional: NAD, calm, comfortable Vitals:   04/29/20 1313  BP: 140/84  Pulse: 96  Resp: 18  Temp: 97.9 F (36.6 C)  TempSrc: Oral  SpO2: 96%  Weight: 90.3 kg  Height: 4\' 11"  (1.499 m)   Eyes: PERRL, lids and conjunctivae normal ENMT: Mucous membranes are moist.  Neck: normal, supple, no masses, no thyromegaly Respiratory: clear to auscultation bilaterally, no wheezing, no crackles. Normal respiratory effort. No accessory muscle use.  Cardiovascular: Regular rate and rhythm, no murmurs / rubs / gallops. No extremity edema. 2+  pedal pulses.  Abdomen: no tenderness, no masses palpated. No hepatosplenomegaly. Bowel sounds positive.  Musculoskeletal: no clubbing / cyanosis. No joint deformity upper and lower extremities. Good ROM, no contractures. Normal muscle tone.  No asterixis Skin: no rashes, lesions, ulcers. No induration Neurologic: No apparent cranial nerve abnormality, moving all extremities spontaneously Psychiatric: Normal judgment and insight. Alert and oriented x 3. Normal mood.   Labs on Admission: I have personally reviewed following labs and imaging studies  CBC: Recent Labs  Lab 04/29/20 1424  WBC 11.0*  NEUTROABS 8.1*  HGB 9.5*  HCT 31.9*  MCV 97.3  PLT 673*   Basic Metabolic Panel: Recent Labs  Lab 04/29/20 1424  NA 137  K 4.8  CL 103  CO2 21*  GLUCOSE 112*  BUN 62*  CREATININE 7.11*  CALCIUM 9.1   Liver Function Tests: Recent Labs  Lab 04/29/20 1424  AST 19  ALT 10  ALKPHOS 78  BILITOT 0.4  PROT 8.5*  ALBUMIN 3.3*   Urine analysis:    Component Value Date/Time   COLORURINE YELLOW 04/29/2020 1421   APPEARANCEUR CLOUDY (A) 04/29/2020 1421   LABSPEC 1.013 04/29/2020 1421   PHURINE 5.0 04/29/2020 1421   GLUCOSEU NEGATIVE 04/29/2020 1421   HGBUR LARGE (A) 04/29/2020 1421   BILIRUBINUR NEGATIVE 04/29/2020 1421   KETONESUR NEGATIVE 04/29/2020 1421   PROTEINUR >=300 (A) 04/29/2020 1421   UROBILINOGEN 0.2 02/18/2012 2113   NITRITE NEGATIVE 04/29/2020 1421   LEUKOCYTESUR MODERATE (A) 04/29/2020 1421    Radiological Exams on Admission: DG Chest 2 View  Result Date: 04/29/2020 CLINICAL DATA:  Shortness of breath EXAM: CHEST - 2 VIEW COMPARISON:  04/25/2020 FINDINGS: Ill-defined pulmonary opacities are again noted within the lungs, stable are slightly increased since prior study. Heart is normal size. Linear scarring or atelectasis in the lingula. No effusions or pneumothorax. No acute bony abnormality. IMPRESSION: Ill-defined opacities throughout the lungs, stable or  slightly worsened since prior study. Electronically Signed   By: Rolm Baptise M.D.   On: 04/29/2020 14:02   CT RENAL STONE STUDY  Result Date: 04/29/2020 CLINICAL DATA:  Nonspecific flank pain with discolored urine. No reported history of renal calculi. EXAM: CT ABDOMEN AND PELVIS WITHOUT CONTRAST TECHNIQUE: Multidetector CT imaging of the abdomen and pelvis was performed following the standard protocol without IV contrast. COMPARISON:  Abdominopelvic CT 02/19/2012. Chest radiographs 04/29/2020 and 11/19/2016. FINDINGS: Lower chest: Chronic lung disease with diffuse bronchiectasis, subpleural reticulation and architectural distortion at both lung bases. There are scattered ill-defined nodular  densities which could reflect scarring or inflammation. No discrete pulmonary nodules. No significant pleural or pericardial effusion. Hepatobiliary: The liver appears unremarkable as imaged in the noncontrast state. The gallbladder is incompletely distended. There is a nitrogen containing gallstone, but no apparent significant gallbladder wall thickening, surrounding inflammation or biliary dilatation. Pancreas: Unremarkable. No pancreatic ductal dilatation or surrounding inflammatory changes. Spleen: Normal in size without focal abnormality. Adrenals/Urinary Tract: Both adrenal glands appear normal. There are enlarging low-density renal lesions bilaterally, likely all cysts. No suspicious renal findings on noncontrast imaging. No evidence of urinary tract calculus, hydronephrosis or perinephric soft tissue stranding. The bladder is incompletely distended with possible mild wall thickening. Stomach/Bowel: No evidence of bowel wall thickening, distention or surrounding inflammatory change. The cecum is in the right upper quadrant of the abdomen. The appendix appears normal. There is fluid throughout the proximal to mid colon. Mild descending and sigmoid colon diverticulosis. Vascular/Lymphatic: There are no enlarged  abdominal or pelvic lymph nodes. Mild aortic and branch vessel atherosclerosis. No acute vascular findings on noncontrast imaging. Reproductive: The uterus and ovaries appear normal. No adnexal mass. Other: A periumbilical hernia containing only fat has mildly enlarged. There is no herniated bowel, ascites or free air. Musculoskeletal: No acute or significant osseous findings. Chronic bilateral femoral head avascular necrosis without subchondral collapse. Chronic progressive atrophy within the anterior musculature of both proximal thighs. IMPRESSION: 1. No evidence of urinary tract calculus or hydronephrosis. 2. Enlarging bilateral renal cysts. 3. Cholelithiasis without evidence of cholecystitis or biliary dilatation. 4. Fluid-filled proximal to mid colon as can be seen with diarrheal state. No evidence of acute inflammation. 5. Progressive chronic lung disease at both lung bases with associated architectural distortion, subpleural reticulation and bronchiectasis. Some of this could relate to resolving superimposed inflammation. 6. Chronic bilateral femoral head avascular necrosis without subchondral collapse. 7. Aortic Atherosclerosis (ICD10-I70.0). Electronically Signed   By: Richardean Sale M.D.   On: 04/29/2020 15:06    EKG: Independently reviewed.  QTc 420.  Sinus rhythm.  No significant ST-T wave changes compared to prior EKG.  Assessment/Plan Principal Problem:   ARF (acute renal failure) (HCC) Active Problems:   Diverticulosis of colon without hemorrhage   Hashimoto's thyroiditis   Acute renal failure- creatinine markedly elevated at  7.11, with a BUN of 62.  Potassium 4.8.,  Stable blood pressure.  UA showing hemoglobin and protein.  Concern for glomerulonephritis, pulmonary-renal syndromes.  Renal stone studies-no urinary tract calculus or hydronephrosis, shows enlarging bilateral renal cysts.  At this time no identifiable nephrotoxic exposures that would explain her acute renal failure. -1 L  Normal saline bolus given, hold off on further IVF following nephrology recs. -Monitor input and output -I talked to nephrologist Dr. Royce Macadamia, admit to [facility] under hospitalist service, patient has been evaluated in the ED here at [place], right upper quadrant ultrasound ordered -Renal panel in a.m. -Avoid nephrotoxic agents, all nonessential medications held. -Bladder scan, document postvoid residual -NPO Midnight -Denies urinary complaints, hold off on further antibiotics for UTI, follow-up urine cultures.   Acute on chronic anemia- hemoglobin 9.5, hemoglobin a month ago was 11.3.  Likely secondary to new renal insufficiency. -Anemia panel in a.m. -CBC a.m.  Thrombocytosis-808, ?  Reactive thrombocytosis. -CBC a.m. - Anemia panel  Dyspnea - with dry cough, on room air, symptoms persistent for 1 month after completing treatment for pneumonia with ceftriaxone/Omnicef and azithromycin.  Chest imaging showing diffuse bronchiectasis, subpleural reticulation and architectural distortion at both lung bases, ill-defined nodular  densities which could reflect scarring or inflammation. - Patient appears to be developing chronic lung disease, will await renal biopsy findings -Will need pulmonology follow-up, likely as an outpatient  Hypertension-systolic 081K to 481E, not on antihypertensives. - PRN labetalol 10mg   for systolic greater than 563.   DVT prophylaxis: SCDs for now Code Status: Full code Family Communication: None at bedside Disposition Plan: > 2 days, pending nephrology evaluation, biopsy result, renal function. Consults called: Nephrology Admission status: Inpatient, telemetry I certify that at the point of admission it is my clinical judgment that the patient will require inpatient hospital care spanning beyond 2 midnights from the point of admission due to high intensity of service, high risk for further deterioration and high frequency of surveillance  required. The following factors support the patient status of inpatient: Significant worsening renal function requiring inpatient hospitalization for monitoring and biopsy.   Bethena Roys MD [facility]  04/29/2020, 8:16 PM

## 2020-04-30 ENCOUNTER — Inpatient Hospital Stay (HOSPITAL_COMMUNITY): Payer: Medicare Other

## 2020-04-30 DIAGNOSIS — K5730 Diverticulosis of large intestine without perforation or abscess without bleeding: Secondary | ICD-10-CM

## 2020-04-30 LAB — BASIC METABOLIC PANEL
Anion gap: 11 (ref 5–15)
BUN: 54 mg/dL — ABNORMAL HIGH (ref 8–23)
CO2: 19 mmol/L — ABNORMAL LOW (ref 22–32)
Calcium: 8.7 mg/dL — ABNORMAL LOW (ref 8.9–10.3)
Chloride: 109 mmol/L (ref 98–111)
Creatinine, Ser: 7.14 mg/dL — ABNORMAL HIGH (ref 0.44–1.00)
GFR calc Af Amer: 6 mL/min — ABNORMAL LOW (ref >60)
GFR calc non Af Amer: 5 mL/min — ABNORMAL LOW (ref >60)
Glucose, Bld: 106 mg/dL — ABNORMAL HIGH (ref 70–99)
Potassium: 5.2 mmol/L — ABNORMAL HIGH (ref 3.5–5.1)
Sodium: 139 mmol/L (ref 135–145)

## 2020-04-30 LAB — CBC
HCT: 27.4 % — ABNORMAL LOW (ref 36.0–46.0)
Hemoglobin: 8.2 g/dL — ABNORMAL LOW (ref 12.0–15.0)
MCH: 28.5 pg (ref 26.0–34.0)
MCHC: 29.9 g/dL — ABNORMAL LOW (ref 30.0–36.0)
MCV: 95.1 fL (ref 80.0–100.0)
Platelets: 695 10*3/uL — ABNORMAL HIGH (ref 150–400)
RBC: 2.88 MIL/uL — ABNORMAL LOW (ref 3.87–5.11)
RDW: 16.6 % — ABNORMAL HIGH (ref 11.5–15.5)
WBC: 11.7 10*3/uL — ABNORMAL HIGH (ref 4.0–10.5)
nRBC: 0 % (ref 0.0–0.2)

## 2020-04-30 LAB — FOLATE: Folate: 5.3 ng/mL — ABNORMAL LOW (ref >5.9)

## 2020-04-30 LAB — PROTIME-INR
INR: 1.1 (ref 0.8–1.2)
Prothrombin Time: 13.6 seconds (ref 11.4–15.2)

## 2020-04-30 LAB — VITAMIN B12: Vitamin B-12: 569 pg/mL (ref 180–914)

## 2020-04-30 LAB — URINE CULTURE: Culture: NO GROWTH

## 2020-04-30 LAB — RETICULOCYTES
Immature Retic Fract: 21.6 % — ABNORMAL HIGH (ref 2.3–15.9)
RBC.: 2.85 MIL/uL — ABNORMAL LOW (ref 3.87–5.11)
Retic Count, Absolute: 39.9 10*3/uL (ref 19.0–186.0)
Retic Ct Pct: 1.4 % (ref 0.4–3.1)

## 2020-04-30 LAB — IRON AND TIBC
Iron: 17 ug/dL — ABNORMAL LOW (ref 28–170)
Saturation Ratios: 7 % — ABNORMAL LOW (ref 10.4–31.8)
TIBC: 239 ug/dL — ABNORMAL LOW (ref 250–450)
UIBC: 222 ug/dL

## 2020-04-30 LAB — FERRITIN: Ferritin: 425 ng/mL — ABNORMAL HIGH (ref 11–307)

## 2020-04-30 MED ORDER — MIDAZOLAM HCL 2 MG/2ML IJ SOLN
INTRAMUSCULAR | Status: AC
  Filled 2020-04-30: qty 4

## 2020-04-30 MED ORDER — FENTANYL CITRATE (PF) 100 MCG/2ML IJ SOLN
INTRAMUSCULAR | Status: AC | PRN
  Administered 2020-04-30: 25 ug via INTRAVENOUS

## 2020-04-30 MED ORDER — SODIUM CHLORIDE 0.9 % IV SOLN
510.0000 mg | Freq: Once | INTRAVENOUS | Status: AC
  Administered 2020-04-30: 510 mg via INTRAVENOUS
  Filled 2020-04-30: qty 17

## 2020-04-30 MED ORDER — GELATIN ABSORBABLE 12-7 MM EX MISC
CUTANEOUS | Status: AC
  Filled 2020-04-30: qty 1

## 2020-04-30 MED ORDER — BOOST / RESOURCE BREEZE PO LIQD CUSTOM
1.0000 | Freq: Two times a day (BID) | ORAL | Status: DC
  Administered 2020-05-01 – 2020-05-06 (×10): 1 via ORAL

## 2020-04-30 MED ORDER — LABETALOL HCL 5 MG/ML IV SOLN: 10.0000 mg | INTRAVENOUS | Status: DC | PRN

## 2020-04-30 MED ORDER — MIDAZOLAM HCL 2 MG/2ML IJ SOLN
INTRAMUSCULAR | Status: AC | PRN
  Administered 2020-04-30: 0.5 mg via INTRAVENOUS

## 2020-04-30 MED ORDER — FENTANYL CITRATE (PF) 100 MCG/2ML IJ SOLN
INTRAMUSCULAR | Status: AC
  Filled 2020-04-30: qty 4

## 2020-04-30 MED ORDER — SODIUM BICARBONATE 650 MG PO TABS
650.0000 mg | ORAL_TABLET | Freq: Two times a day (BID) | ORAL | Status: DC
  Administered 2020-04-30 – 2020-05-04 (×9): 650 mg via ORAL
  Filled 2020-04-30 (×9): qty 1

## 2020-04-30 NOTE — Consult Note (Signed)
Chief Complaint: Patient was seen in consultation today for random renal biopsy Chief Complaint  Patient presents with  . Shortness of Breath   at the request of Dr Katheren Puller  Supervising Physician: Arne Cleveland  Patient Status: [facility] - In-pt  History of Present Illness: Cheryl [name] is a 74 y.o. female   Labs from PCP showing worsening renal failure Cr 03/27/20: 1.5; Cr 5/11: 7.1 and 5/12: 7.14  Asked to come to ED per PCP Relates Nausea/no vomiting Urine is light tea color +SOB x 1 mo Taking Excedrin for few days - was on Celebrex yrs ago Sister has passed with Renal failure Known renal cysts bilaterally: US revealing same Hematuria; proteinuria Concerning for Glumerulonephritis  Requests now for random renal biopsy per Nephrology  Past Medical History:  Diagnosis Date  . Acid reflux   . Arthritis   . Neuromuscular disorder ([place])   . Sinus drainage   . Sleep apnea    had surgery to correct    Past Surgical History:  Procedure Laterality Date  . ANTERIOR CERVICAL DECOMP/DISCECTOMY FUSION N/A 11/19/2016   Procedure: ANTERIOR CERVICAL DISCECTOMY AND FUSION C5-6 WITH PLATES, SCREWS, CAGE, VIVIGEN II;  Surgeon: Jessy Oto, MD;  Location: [place];  Service: Orthopedics;  Laterality: N/A;  . CARPAL TUNNEL RELEASE    . CATARACT EXTRACTION, BILATERAL Bilateral 2017   One in December 2017 and one in January 2018  . COLONOSCOPY  02/15/2005   BMW:UXLKG small polyps ablated via cold biopsy, one from transverse colon and two from the rectum/Small external hemorrhoids  . COLONOSCOPY  07/10/2010   MWN:UUVOZD rectum/long redundant colon, polyps in the sigmoid, descending, hepatic flexure/ADENOMATOUS POLYPS. next TCS due 06/2015  . COLONOSCOPY  1995   3 polyps, path revealed chronic colitis  . COLONOSCOPY N/A 08/05/2015   Procedure: COLONOSCOPY;  Surgeon: Daneil Dolin, MD;  [facility];  Service: Endoscopy;  Laterality: N/A;  1000  . ESOPHAGOGASTRODUODENOSCOPY   1995   Gastritis  . ESOPHAGOGASTRODUODENOSCOPY N/A 04/16/2013   RMR: HH  . ESOPHAGOGASTRODUODENOSCOPY N/A 05/13/2016   Procedure: ESOPHAGOGASTRODUODENOSCOPY (EGD);  Surgeon: Daneil Dolin, MD;  [facility];  Service: Endoscopy;  Laterality: N/A;  145  . ESOPHAGOGASTRODUODENOSCOPY N/A 08/18/2016   Procedure: ESOPHAGOGASTRODUODENOSCOPY (EGD);  Surgeon: Daneil Dolin, MD;  [facility];  Service: Endoscopy;  Laterality: N/A;  7:45 AM  . KNEE SURGERY     x2  . ROTATOR CUFF REPAIR     x2  . SHOULDER ARTHROSCOPY WITH ROTATOR CUFF REPAIR AND SUBACROMIAL DECOMPRESSION Right 01/21/2015   Procedure: RIGHT SHOULDER ARTHROSCOPIC DEBRIDEMNT OF G-H JOINT AND REMOVAL OF LOOSE BODIES,ARTHROSCOPIC SUBACROMIAL DECOMPRESSION,MINI OPEN RCT REPAIR WITH SUPPLEMENTAL [facility] PATCH;  Surgeon: Garald Balding, MD;  Location: [place];  Service: Orthopedics;  Laterality: Right;  . TONSILLECTOMY    . TRIGGER FINGER RELEASE    . UVULOPALATOPHARYNGOPLASTY      Allergies: Prednisone, Tramadol, Aspirin, and Celecoxib  Medications: Prior to Admission medications   Medication Sig Start Date End Date Taking? Authorizing Provider  acetaminophen (TYLENOL) 500 MG tablet Take 500-1,000 mg by mouth every 6 (six) hours as needed (for pain/headache).   Yes [provider]  albuterol (VENTOLIN HFA) 108 (90 Base) MCG/ACT inhaler Inhale 2 puffs into the lungs every 6 (six) hours as needed for wheezing or shortness of breath. 03/29/20  Yes Emokpae, Courage, MD  buPROPion (WELLBUTRIN XL) 300 MG 24 hr tablet Take 300 mg by mouth daily. 05/15/19  Yes [provider]  CALCIUM-VITAMIN D PO Take 5 mLs by mouth every morning.    Yes [provider]  Cholecalciferol (VITAMIN D3) 125 MCG (5000 UT) CAPS Take 1 capsule by mouth daily.   Yes [provider]  diclofenac sodium (VOLTAREN) 1 % GEL Apply 2-4 g topically 4 (four) times daily. Patient taking differently: Apply 2-4 g topically  4 (four) times daily as needed (for pain).  08/02/17  Yes Petrarca, Mike Craze, PA-C  guaiFENesin (MUCINEX) 600 MG 12 hr tablet Take 1 tablet (600 mg total) by mouth 2 (two) times daily. Patient taking differently: Take 600 mg by mouth 2 (two) times daily as needed for cough or to loosen phlegm.  03/29/20  Yes Roxan Hockey, MD  linaclotide (LINZESS) 290 MCG CAPS capsule Take 1 capsule (290 mcg total) by mouth daily before breakfast. 10/30/19  Yes Jodi Mourning, Kristen S, PA-C  montelukast (SINGULAIR) 10 MG tablet Take 1 tablet (10 mg total) by mouth every morning. 03/29/20  Yes Emokpae, Courage, MD  nystatin (MYCOSTATIN) 100000 UNIT/ML suspension Take 5 mLs by mouth 2 (two) times daily as needed (for thrush).  02/22/20  Yes [provider]  ondansetron (ZOFRAN) 4 MG tablet Take 4 mg by mouth every 8 (eight) hours as needed for nausea or vomiting.  04/25/20  Yes [provider]  pantoprazole (PROTONIX) 40 MG tablet Take 1 tablet (40 mg total) by mouth daily. 02/18/17  Yes Philemon Kingdom, MD  polyethylene glycol (MIRALAX / GLYCOLAX) packet Take 17 g by mouth daily as needed for mild constipation or moderate constipation.    Yes [provider]  raloxifene (EVISTA) 60 MG tablet Take 60 mg by mouth daily.   Yes [provider]  rOPINIRole (REQUIP) 0.25 MG tablet Take 0.75 mg by mouth daily as needed (for rls).  12/12/19  Yes [provider]  tolterodine (DETROL) 1 MG tablet Take 1 mg by mouth 2 (two) times daily.   Yes [provider]  vitamin B-12 (CYANOCOBALAMIN) 1000 MCG tablet Take 1,000 mcg by mouth daily.   Yes [provider]     Family History  Problem Relation Age of Onset  . Cancer Mother   . Cancer Father   . Diabetes Sister   . Colon cancer Neg Hx   . Liver disease Neg Hx     Social History   Socioeconomic History  . Marital status: Married    Spouse name: Not on file  . Number of children: 2  . Years of education: Not on file    . Highest education level: Not on file  Occupational History  . Occupation: BUS DRIVER    Employer: [employer]  Tobacco Use  . Smoking status: Former Smoker    Packs/day: 0.10    Years: 48.00    Pack years: 4.80    Types: Cigarettes    Quit date: 05/11/2005    Years since quitting: 14.9  . Smokeless tobacco: Never Used  Substance and Sexual Activity  . Alcohol use: No    Alcohol/week: 0.0 standard drinks  . Drug use: No  . Sexual activity: Not on file  Other Topics Concern  . Not on file  Social History Narrative  . Not on file   Social Determinants of Health   Financial Resource Strain:   . Difficulty of Paying Living Expenses:   Food Insecurity:   . Worried About Charity fundraiser in the Last Year:   . [employer] of  Food in the Last Year:   Transportation Needs:   . Film/video editor (Medical):   Marland Kitchen Lack of Transportation (Non-Medical):   Physical Activity:   . Days of Exercise per Week:   . Minutes of Exercise per Session:   Stress:   . Feeling of Stress :   Social Connections:   . Frequency of Communication with Friends and Family:   . Frequency of Social Gatherings with Friends and Family:   . Attends Religious Services:   . Active Member of Clubs or Organizations:   . Attends Archivist Meetings:   Marland Kitchen Marital Status:     Review of Systems: A 12 point ROS discussed and pertinent positives are indicated in the HPI above.  All other systems are negative.  Review of Systems  Constitutional: Positive for activity change and fatigue. Negative for appetite change, fever and unexpected weight change.  Respiratory: Positive for shortness of breath. Negative for cough.   Gastrointestinal: Positive for nausea. Negative for abdominal pain.  Neurological: Positive for weakness.  Psychiatric/Behavioral: Negative for behavioral problems and confusion.    Vital Signs: BP (!) 141/75 (BP Location: Left Arm)   Pulse (!) 106   Temp 98.7 F (37.1 C)  (Oral)   Resp 18   Ht 4\' 11"  (1.499 m)   Wt 204 lb 3.2 oz (92.6 kg)   SpO2 96%   BMI 41.24 kg/m   Physical Exam Vitals reviewed.  Cardiovascular:     Rate and Rhythm: Normal rate and regular rhythm.  Pulmonary:     Effort: Pulmonary effort is normal.  Musculoskeletal:        General: Normal range of motion.     Right lower leg: No edema.     Left lower leg: No edema.  Skin:    General: Skin is warm.  Neurological:     Mental Status: She is alert and oriented to person, place, and time.  Psychiatric:        Behavior: Behavior normal.     Imaging: DG Chest 2 View  Result Date: 04/29/2020 CLINICAL DATA:  Shortness of breath EXAM: CHEST - 2 VIEW COMPARISON:  04/25/2020 FINDINGS: Ill-defined pulmonary opacities are again noted within the lungs, stable are slightly increased since prior study. Heart is normal size. Linear scarring or atelectasis in the lingula. No effusions or pneumothorax. No acute bony abnormality. IMPRESSION: Ill-defined opacities throughout the lungs, stable or slightly worsened since prior study. Electronically Signed   By: Rolm Baptise M.D.   On: 04/29/2020 14:02   DG Chest 2 View  Result Date: 04/25/2020 CLINICAL DATA:  History of recent pneumonia EXAM: CHEST - 2 VIEW COMPARISON:  03/27/2020, 11/19/2016 FINDINGS: Partial but incomplete clearing of ill-defined pulmonary opacities. Linear areas of scarring bilaterally. No pleural effusion. Stable cardiomediastinal silhouette. No pneumothorax. IMPRESSION: Partial but incomplete clearing of ill-defined pulmonary opacities possibly due to slowly resolving pneumonia or post infectious scarring. Electronically Signed   By: Donavan Foil M.D.   On: 04/25/2020 22:41   US RENAL  Result Date: 04/30/2020 CLINICAL DATA:  Acute kidney injury EXAM: RENAL / URINARY TRACT ULTRASOUND COMPLETE COMPARISON:  None. FINDINGS: Right Kidney: Renal measurements: 9.2 x 4.5 x 4.6 cm = volume: 95.6 mL. There are 2 discrete simple renal  cysts. The larger measures 3.7 x 3.1 x 3.2 cm and the smaller measures 2.6 x 3.0 x 2.9 cm. No hydronephrosis. Normal renal echogenicity. No shadowing calculi. Left Kidney: Renal measurements: 9.8 x 5.4 x 5.4 cm =  volume: 151.1 mL. There is a simple renal cyst measuring 3.3 x 2.9 x 3 6 cm. No hydronephrosis or shadowing calculus. Normal parenchymal echogenicity. Bladder: Appears normal for degree of bladder distention. Other: None. IMPRESSION: No hydronephrosis or parenchymal abnormality. Multiple simple renal cysts. Electronically Signed   By: Ulyses Jarred M.D.   On: 04/30/2020 00:04   CT RENAL STONE STUDY  Result Date: 04/29/2020 CLINICAL DATA:  Nonspecific flank pain with discolored urine. No reported history of renal calculi. EXAM: CT ABDOMEN AND PELVIS WITHOUT CONTRAST TECHNIQUE: Multidetector CT imaging of the abdomen and pelvis was performed following the standard protocol without IV contrast. COMPARISON:  Abdominopelvic CT 02/19/2012. Chest radiographs 04/29/2020 and 11/19/2016. FINDINGS: Lower chest: Chronic lung disease with diffuse bronchiectasis, subpleural reticulation and architectural distortion at both lung bases. There are scattered ill-defined nodular densities which could reflect scarring or inflammation. No discrete pulmonary nodules. No significant pleural or pericardial effusion. Hepatobiliary: The liver appears unremarkable as imaged in the noncontrast state. The gallbladder is incompletely distended. There is a nitrogen containing gallstone, but no apparent significant gallbladder wall thickening, surrounding inflammation or biliary dilatation. Pancreas: Unremarkable. No pancreatic ductal dilatation or surrounding inflammatory changes. Spleen: Normal in size without focal abnormality. Adrenals/Urinary Tract: Both adrenal glands appear normal. There are enlarging low-density renal lesions bilaterally, likely all cysts. No suspicious renal findings on noncontrast imaging. No evidence of  urinary tract calculus, hydronephrosis or perinephric soft tissue stranding. The bladder is incompletely distended with possible mild wall thickening. Stomach/Bowel: No evidence of bowel wall thickening, distention or surrounding inflammatory change. The cecum is in the right upper quadrant of the abdomen. The appendix appears normal. There is fluid throughout the proximal to mid colon. Mild descending and sigmoid colon diverticulosis. Vascular/Lymphatic: There are no enlarged abdominal or pelvic lymph nodes. Mild aortic and branch vessel atherosclerosis. No acute vascular findings on noncontrast imaging. Reproductive: The uterus and ovaries appear normal. No adnexal mass. Other: A periumbilical hernia containing only fat has mildly enlarged. There is no herniated bowel, ascites or free air. Musculoskeletal: No acute or significant osseous findings. Chronic bilateral femoral head avascular necrosis without subchondral collapse. Chronic progressive atrophy within the anterior musculature of both proximal thighs. IMPRESSION: 1. No evidence of urinary tract calculus or hydronephrosis. 2. Enlarging bilateral renal cysts. 3. Cholelithiasis without evidence of cholecystitis or biliary dilatation. 4. Fluid-filled proximal to mid colon as can be seen with diarrheal state. No evidence of acute inflammation. 5. Progressive chronic lung disease at both lung bases with associated architectural distortion, subpleural reticulation and bronchiectasis. Some of this could relate to resolving superimposed inflammation. 6. Chronic bilateral femoral head avascular necrosis without subchondral collapse. 7. Aortic Atherosclerosis (ICD10-I70.0). Electronically Signed   By: Richardean Sale M.D.   On: 04/29/2020 15:06    Labs:  CBC: Recent Labs    03/27/20 1453 03/28/20 0505 04/29/20 1424 04/30/20 0827  WBC 9.3 8.9 11.0* 11.7*  HGB 11.3* 10.6* 9.5* 8.2*  HCT 37.8 34.3* 31.9* 27.4*  PLT 493* 477* 808* 695*    COAGS: No  results for input(s): INR, APTT in the last 8760 hours.  BMP: Recent Labs    03/27/20 1453 03/28/20 0505 04/29/20 1424 04/30/20 0827  NA 138 138 137 139  K 4.0 3.9 4.8 5.2*  CL 105 105 103 109  CO2 22 24 21* 19*  GLUCOSE 107* 107* 112* 106*  BUN 21 17 62* 54*  CALCIUM 9.2 8.9 9.1 8.7*  CREATININE 1.52* 1.40* 7.11* 7.14*  GFRNONAA 34* 38*  5* 5*  GFRAA 40* 44* 6* 6*    LIVER FUNCTION TESTS: Recent Labs    03/28/20 0505 04/29/20 1424  BILITOT 0.5 0.4  AST 21 19  ALT 15 10  ALKPHOS 69 78  PROT 7.2 8.5*  ALBUMIN 3.1* 3.3*    TUMOR MARKERS: No results for input(s): AFPTM, CEA, CA199, CHROMGRNA in the last 8760 hours.  Assessment and Plan:  Worsening renal failure Proteinuria; hematuria Scheduled for random renal biopsy today per Nephrology Risks and benefits of random renal bx was discussed with the patient and/or patient's family including, but not limited to bleeding, infection, damage to adjacent structures or low yield requiring additional tests.  All of the questions were answered and there is agreement to proceed. Consent signed and in chart.   Thank you for this interesting consult.  I greatly enjoyed meeting Cheryl [name] and look forward to participating in their care.  A copy of this report was sent to the requesting provider on this date.  Electronically Signed: Lavonia Drafts, PA-C 04/30/2020, 10:46 AM   I spent a total of 20 Minutes    in face to face in clinical consultation, greater than 50% of which was counseling/coordinating care for random renal bx

## 2020-04-30 NOTE — Progress Notes (Addendum)
[facility] Progress Note    Assessment/ Plan:   1. AKI - also with hematuria and proteinuria, causing concern for GN.  Patient also with recent NSAID use likely contributing.  Planning to proceed with renal biopsy.  ANA, ANCA, PR3 Ab, complement, anti-GBM, SPEP, free light chains pending.  On admission had increased from 1.4 to 7.11 in 1 month, this AM stable at 7.14.  Patient continues to make urine, 2 unmeasured occurrences since admission, but reports more.   Keep NPO pending renal biopsy today.  Order placed, pathology form completed and placed in paper chart. No absolute indications for HD-  Will follow 2. Proteinuria - Protein Creatinine ratio 0.37.  GN workup per above. 3. Hematuria - >50 RBC, GN workup per above. 4. Dyspnea - Concern for pulmonary-renal syndrome.    Work-up per above. 5.  HTN - new diagnosis per patient.  On labetalol prn SBP >170. 6. Normocytic Anemia - Hgb 9.5 on admission, down to 8.2.  No indication for transfusion at this time.  Will order anemia panel. 7. Leukocyturia - urine culture from 5/11 pending.   8. Renal Cysts - seen on CT renal stone study, Renal US with 3 simple renal cysts, 2 on right measuring 3.7 x 3.1 x 3.2 cm and 2.6 x 3.0 x 2.9 cm, with 1 on left measuring 3.3 x 2.9 x 3.6 cm. Not as dramatic on u/s-  Not indicative of a polycystic dz  Patient seen and examined, agree with above note with above modifications.  NAD-  Making urine-  crt stable at 7.  No significant resp issues.  No indications for HD at this time but will add sodium bicarb for acidosis and mild hyperkalemia.  Anemic with decreased iron stores so will treat with iron.  Possible UTI on rocephin.  Serologies for this acute/subacute renal failure are pending-  Since renal function did not get any better will plan for renal biopsy for definitive dx Corliss Parish, MD 04/30/2020      Subjective:   Patient transferred from [facility] overnight for likely renal biopsy  at [facility].  2 unmeasured urine occurrences.  No other acute events overnight.  Reports continued large amounts of urine- actually increased from, previous-  crt is stable, not improved overnight though.   Objective:   BP (!) 154/77 (BP Location: Left Arm)   Pulse (!) 109   Temp 99 F (37.2 C)   Resp 20   Ht 4\' 11"  (1.499 m)   Wt 92.6 kg   SpO2 99%   BMI 41.24 kg/m   Intake/Output Summary (Last 24 hours) at 04/30/2020 0817 Last data filed at 04/30/2020 0417 Gross per 24 hour  Intake 0 ml  Output --  Net 0 ml   Weight change:   Physical Exam: Gen: in NAD CVS: RRR no m/r/g Resp:CTAB no increased WOB, on RA Abd: soft, non-tender Ext: no edema BLE  Imaging: DG Chest 2 View  Result Date: 04/29/2020 CLINICAL DATA:  Shortness of breath EXAM: CHEST - 2 VIEW COMPARISON:  04/25/2020 FINDINGS: Ill-defined pulmonary opacities are again noted within the lungs, stable are slightly increased since prior study. Heart is normal size. Linear scarring or atelectasis in the lingula. No effusions or pneumothorax. No acute bony abnormality. IMPRESSION: Ill-defined opacities throughout the lungs, stable or slightly worsened since prior study. Electronically Signed   By: Rolm Baptise M.D.   On: 04/29/2020 14:02   US RENAL  Result Date: 04/30/2020 CLINICAL DATA:  Acute kidney injury  EXAM: RENAL / URINARY TRACT ULTRASOUND COMPLETE COMPARISON:  None. FINDINGS: Right Kidney: Renal measurements: 9.2 x 4.5 x 4.6 cm = volume: 95.6 mL. There are 2 discrete simple renal cysts. The larger measures 3.7 x 3.1 x 3.2 cm and the smaller measures 2.6 x 3.0 x 2.9 cm. No hydronephrosis. Normal renal echogenicity. No shadowing calculi. Left Kidney: Renal measurements: 9.8 x 5.4 x 5.4 cm = volume: 151.1 mL. There is a simple renal cyst measuring 3.3 x 2.9 x 3 6 cm. No hydronephrosis or shadowing calculus. Normal parenchymal echogenicity. Bladder: Appears normal for degree of bladder distention. Other: None. IMPRESSION: No  hydronephrosis or parenchymal abnormality. Multiple simple renal cysts. Electronically Signed   By: Ulyses Jarred M.D.   On: 04/30/2020 00:04   CT RENAL STONE STUDY  Result Date: 04/29/2020 CLINICAL DATA:  Nonspecific flank pain with discolored urine. No reported history of renal calculi. EXAM: CT ABDOMEN AND PELVIS WITHOUT CONTRAST TECHNIQUE: Multidetector CT imaging of the abdomen and pelvis was performed following the standard protocol without IV contrast. COMPARISON:  Abdominopelvic CT 02/19/2012. Chest radiographs 04/29/2020 and 11/19/2016. FINDINGS: Lower chest: Chronic lung disease with diffuse bronchiectasis, subpleural reticulation and architectural distortion at both lung bases. There are scattered ill-defined nodular densities which could reflect scarring or inflammation. No discrete pulmonary nodules. No significant pleural or pericardial effusion. Hepatobiliary: The liver appears unremarkable as imaged in the noncontrast state. The gallbladder is incompletely distended. There is a nitrogen containing gallstone, but no apparent significant gallbladder wall thickening, surrounding inflammation or biliary dilatation. Pancreas: Unremarkable. No pancreatic ductal dilatation or surrounding inflammatory changes. Spleen: Normal in size without focal abnormality. Adrenals/Urinary Tract: Both adrenal glands appear normal. There are enlarging low-density renal lesions bilaterally, likely all cysts. No suspicious renal findings on noncontrast imaging. No evidence of urinary tract calculus, hydronephrosis or perinephric soft tissue stranding. The bladder is incompletely distended with possible mild wall thickening. Stomach/Bowel: No evidence of bowel wall thickening, distention or surrounding inflammatory change. The cecum is in the right upper quadrant of the abdomen. The appendix appears normal. There is fluid throughout the proximal to mid colon. Mild descending and sigmoid colon diverticulosis.  Vascular/Lymphatic: There are no enlarged abdominal or pelvic lymph nodes. Mild aortic and branch vessel atherosclerosis. No acute vascular findings on noncontrast imaging. Reproductive: The uterus and ovaries appear normal. No adnexal mass. Other: A periumbilical hernia containing only fat has mildly enlarged. There is no herniated bowel, ascites or free air. Musculoskeletal: No acute or significant osseous findings. Chronic bilateral femoral head avascular necrosis without subchondral collapse. Chronic progressive atrophy within the anterior musculature of both proximal thighs. IMPRESSION: 1. No evidence of urinary tract calculus or hydronephrosis. 2. Enlarging bilateral renal cysts. 3. Cholelithiasis without evidence of cholecystitis or biliary dilatation. 4. Fluid-filled proximal to mid colon as can be seen with diarrheal state. No evidence of acute inflammation. 5. Progressive chronic lung disease at both lung bases with associated architectural distortion, subpleural reticulation and bronchiectasis. Some of this could relate to resolving superimposed inflammation. 6. Chronic bilateral femoral head avascular necrosis without subchondral collapse. 7. Aortic Atherosclerosis (ICD10-I70.0). Electronically Signed   By: Richardean Sale M.D.   On: 04/29/2020 15:06    Labs: BMET Recent Labs  Lab 04/29/20 1424 04/30/20 0827  NA 137 139  K 4.8 5.2*  CL 103 109  CO2 21* 19*  GLUCOSE 112* 106*  BUN 62* 54*  CREATININE 7.11* 7.14*  CALCIUM 9.1 8.7*   CBC Recent Labs  Lab 04/29/20 1424 04/30/20  0827  WBC 11.0* 11.7*  NEUTROABS 8.1*  --   HGB 9.5* 8.2*  HCT 31.9* 27.4*  MCV 97.3 95.1  PLT 808* 695*    Medications:    . raloxifene  60 mg Oral Daily     [place] Constable, DO [facility] Resident, PGY 2 04/30/2020, 8:17 AM

## 2020-04-30 NOTE — Progress Notes (Signed)
PROGRESS NOTE    Cheryl [name]  TML:[id number] DOB: 09-05-1946 DOA: 04/29/2020 PCP: Sharilyn Sites, MD    Brief Narrative:  Cheryl [name] is a 74 y.o. female with medical history significant for Hashimoto's, diverticulosis.  Patient presented to the ED with complaints of persistent and gradually worsening difficulty breathing over the past month since she was hospitalized.  She reports persistent dry cough.  No fevers no chills.  No chest pain.  No lower extremity swelling. Patient saw her primary care provider, and was referred to the ED due to elevated creatinine. She denies nausea, vomiting or loose stools, she reports stable oral intake, she reports recent NSAID use.  She denies diuretic use.  No family history of lung disease that she is aware of.  She reports generalized abdominal queasiness over the past 1 to 2 weeks.  She denies urinary frequency or dysuria.  Recent hospitalization- 4/8- 4/10, treated for multifocal pneumonia with acute hypoxic respiratory failure, treated with ceftriaxone and azithromycin, inpatient and discharged on a Omnicef and azithromycin.  She did not require supplemental oxygen on discharge.   ED Course: Stable vitals.  Creatinine elevated at 7.11.  Unremarkable BNP 44. Platelets elevated at 808.  Hemoglobin low at 9.5.  Likely large hemoglobin, protein, positive leukocytes, greater than 50 WBCs, no bacteria.  2 view chest xray shows ill-defined opacities throughout the lungs, stable or slightly worsened since prior study.  1 L bolus normal saline given, IV ceftriaxone started. Hospitalist to admit for acute kidney injury.    Assessment & Plan:   Principal Problem:   ARF (acute renal failure) (HCC) Active Problems:   Diverticulosis of colon without hemorrhage   Hashimoto's thyroiditis   Acute renal failure Renal cysts Creatinine markedly elevated at 7.11 with a BUN of 62 on ED presentation.  Urinalysis showing hemoglobin and protein.  CT renal stone study  with no urinary tract calculus or hydronephrosis with enlarging bilateral renal cyst.  Ultrasound renal with no hydronephrosis or parenchymal abnormality with multiple simple renal cyst appreciated.  Routine/creatinine ratio elevated 0.37.  Given proteinuria, concern for glomerulonephritis. --Nephrology following, appreciate assistance --Cr 7.11>7.14 --Underwent CT-guided renal biopsy on 04/30/2020, pathology pending --Kappa/lambda light chain: Pending --SPEP: Pending --GBM antibodies: Pending --C4: Pending --C3: Pending --ANA: Pending --ANCA: Pending --Avoid nephrotoxins, renally dose all medications --Strict I's and O's, specifically close monitoring of urinary output --Started on sodium bicarbonate 650 mg p.o. twice daily --Follow BMP daily  Acute on chronic anemia  Hemoglobin 8.2, MCV 95.1, stable.  Iron 17, TIBC 239, ferritin 425, folate 5.3, B12 569, reticulocyte count count 1.4  --Receiving Feraheme IV today --Continue to monitor CBC, transfuse for hemoglobin less than 7.0  Pyuria Urinalysis with moderate leukoesterase, negative nitrite, no bacteria, greater than 50 WBCs.  Urine culture with no growth.  Initially was started on ceftriaxone, now discontinued.   DVT prophylaxis: SCDs Code Status: Full code Family Communication: No family present at bedside  Disposition Plan:  Status is: Inpatient  Remains inpatient appropriate because:Persistent severe electrolyte disturbances, Ongoing diagnostic testing needed not appropriate for outpatient work up, Unsafe d/c plan and Inpatient level of care appropriate due to severity of illness   Dispo: The patient is from: Home              Anticipated d/c is to: Home              Anticipated d/c date is: 3 days  Patient currently is not medically stable to d/c.         Consultants:   Nephrology  Procedures:   CT-guided renal biopsy 04/30/2020  Antimicrobials:   Ceftriaxone    Subjective: Patient seen  and examined bedside, resting comfortably.  Continues with uneasy feeling with her abdomen.  Continues to make urine.  No other specific complaints at this time.  Pending CT-guided renal biopsy today.  Denies headache, no fever/chills/night sweats, nausea/vomiting, no diarrhea, no chest pain, no palpitations, no shortness of breath.  No acute events overnight per nursing staff.  Objective: Vitals:   04/30/20 1302 04/30/20 1330 04/30/20 1335 04/30/20 1340  BP: (!) 150/91 131/72 129/64 116/68  Pulse: 96 93 97 97  Resp: 16 17 16 16   Temp:      TempSrc:      SpO2: 98% 96% 98% 97%  Weight:      Height:        Intake/Output Summary (Last 24 hours) at 04/30/2020 1505 Last data filed at 04/30/2020 0417 Gross per 24 hour  Intake 0 ml  Output --  Net 0 ml   Filed Weights   04/29/20 1313 04/29/20 2302  Weight: 90.3 kg 92.6 kg    Examination:  General exam: Appears calm and comfortable  Respiratory system: Clear to auscultation. Respiratory effort normal. Cardiovascular system: S1 & S2 heard, RRR. No JVD, murmurs, rubs, gallops or clicks. No pedal edema. Gastrointestinal system: Abdomen is nondistended, soft and nontender. No organomegaly or masses felt. Normal bowel sounds heard. Central nervous system: Alert and oriented. No focal neurological deficits. Extremities: Symmetric 5 x 5 power. Skin: No rashes, lesions or ulcers Psychiatry: Judgement and insight appear normal. Mood & affect appropriate.     Data Reviewed: I have personally reviewed following labs and imaging studies  CBC: Recent Labs  Lab 04/29/20 1424 04/30/20 0827  WBC 11.0* 11.7*  NEUTROABS 8.1*  --   HGB 9.5* 8.2*  HCT 31.9* 27.4*  MCV 97.3 95.1  PLT 808* 094*   Basic Metabolic Panel: Recent Labs  Lab 04/29/20 1424 04/30/20 0827  NA 137 139  K 4.8 5.2*  CL 103 109  CO2 21* 19*  GLUCOSE 112* 106*  BUN 62* 54*  CREATININE 7.11* 7.14*  CALCIUM 9.1 8.7*   GFR: Estimated Creatinine Clearance: 7.1  mL/min (A) (by C-G formula based on SCr of 7.14 mg/dL (H)). Liver Function Tests: Recent Labs  Lab 04/29/20 1424  AST 19  ALT 10  ALKPHOS 78  BILITOT 0.4  PROT 8.5*  ALBUMIN 3.3*   No results for input(s): LIPASE, AMYLASE in the last 168 hours. No results for input(s): AMMONIA in the last 168 hours. Coagulation Profile: Recent Labs  Lab 04/30/20 1106  INR 1.1   Cardiac Enzymes: No results for input(s): CKTOTAL, CKMB, CKMBINDEX, TROPONINI in the last 168 hours. BNP (last 3 results) No results for input(s): PROBNP in the last 8760 hours. HbA1C: No results for input(s): HGBA1C in the last 72 hours. CBG: No results for input(s): GLUCAP in the last 168 hours. Lipid Profile: No results for input(s): CHOL, HDL, LDLCALC, TRIG, CHOLHDL, LDLDIRECT in the last 72 hours. Thyroid Function Tests: No results for input(s): TSH, T4TOTAL, FREET4, T3FREE, THYROIDAB in the last 72 hours. Anemia Panel: Recent Labs    04/30/20 1140  VITAMINB12 569  FOLATE 5.3*  FERRITIN 425*  TIBC 239*  IRON 17*  RETICCTPCT 1.4   Sepsis Labs: No results for input(s): PROCALCITON, LATICACIDVEN in the last 168 hours.  Recent Results (from the past 240 hour(s))  Urine culture     Status: None   Collection Time: 04/29/20  2:21 PM   Specimen: Urine, Random  Result Value Ref Range Status   Specimen Description   Final    URINE, RANDOM Performed at [facility], [address], [place], [place] [address]    Special Requests   Final    NONE Performed at [facility], [address] [place], [place] [address]    Culture   Final    NO GROWTH Performed at [facility], [place] [address] [place], [place] [address]    Report Status 04/30/2020 FINAL  Final  SARS Coronavirus 2 by RT PCR (hospital order, performed in [facility] hospital lab) Nasopharyngeal Nasopharyngeal Swab     Status: None   Collection Time: 04/29/20  2:23 PM   Specimen: Nasopharyngeal Swab  Result Value Ref Range Status   SARS  Coronavirus 2 NEGATIVE NEGATIVE Final    Comment: (NOTE) SARS-CoV-2 target nucleic acids are NOT DETECTED. The SARS-CoV-2 RNA is generally detectable in upper and lower respiratory specimens during the acute phase of infection. The lowest concentration of SARS-CoV-2 viral copies this assay can detect is 250 copies / mL. A negative result does not preclude SARS-CoV-2 infection and should not be used as the sole basis for treatment or other patient management decisions.  A negative result may occur with improper specimen collection / handling, submission of specimen other than nasopharyngeal swab, presence of viral mutation(s) within the areas targeted by this assay, and inadequate number of viral copies (<250 copies / mL). A negative result must be combined with clinical observations, patient history, and epidemiological information. Fact Sheet for Patients:   StrictlyIdeas.no Fact Sheet for Healthcare Providers: BankingDealers.co.za This test is not yet approved or cleared  by the [place] FDA and has been authorized for detection and/or diagnosis of SARS-CoV-2 by FDA under an Emergency Use Authorization (EUA).  This EUA will remain in effect (meaning this test can be used) for the duration of the COVID-19 declaration under Section 564(b)(1) of the Act, 21 U.S.C. section 360bbb-3(b)(1), unless the authorization is terminated or revoked sooner. Performed at [facility], [address], [place], [place] [address]          Radiology Studies: DG Chest 2 View  Result Date: 04/29/2020 CLINICAL DATA:  Shortness of breath EXAM: CHEST - 2 VIEW COMPARISON:  04/25/2020 FINDINGS: Ill-defined pulmonary opacities are again noted within the lungs, stable are slightly increased since prior study. Heart is normal size. Linear scarring or atelectasis in the lingula. No effusions or pneumothorax. No acute bony abnormality. IMPRESSION: Ill-defined  opacities throughout the lungs, stable or slightly worsened since prior study. Electronically Signed   By: Rolm Baptise M.D.   On: 04/29/2020 14:02   US RENAL  Result Date: 04/30/2020 CLINICAL DATA:  Acute kidney injury EXAM: RENAL / URINARY TRACT ULTRASOUND COMPLETE COMPARISON:  None. FINDINGS: Right Kidney: Renal measurements: 9.2 x 4.5 x 4.6 cm = volume: 95.6 mL. There are 2 discrete simple renal cysts. The larger measures 3.7 x 3.1 x 3.2 cm and the smaller measures 2.6 x 3.0 x 2.9 cm. No hydronephrosis. Normal renal echogenicity. No shadowing calculi. Left Kidney: Renal measurements: 9.8 x 5.4 x 5.4 cm = volume: 151.1 mL. There is a simple renal cyst measuring 3.3 x 2.9 x 3 6 cm. No hydronephrosis or shadowing calculus. Normal parenchymal echogenicity. Bladder: Appears normal for degree of bladder distention. Other:  None. IMPRESSION: No hydronephrosis or parenchymal abnormality. Multiple simple renal cysts. Electronically Signed   By: Ulyses Jarred M.D.   On: 04/30/2020 00:04   CT RENAL STONE STUDY  Result Date: 04/29/2020 CLINICAL DATA:  Nonspecific flank pain with discolored urine. No reported history of renal calculi. EXAM: CT ABDOMEN AND PELVIS WITHOUT CONTRAST TECHNIQUE: Multidetector CT imaging of the abdomen and pelvis was performed following the standard protocol without IV contrast. COMPARISON:  Abdominopelvic CT 02/19/2012. Chest radiographs 04/29/2020 and 11/19/2016. FINDINGS: Lower chest: Chronic lung disease with diffuse bronchiectasis, subpleural reticulation and architectural distortion at both lung bases. There are scattered ill-defined nodular densities which could reflect scarring or inflammation. No discrete pulmonary nodules. No significant pleural or pericardial effusion. Hepatobiliary: The liver appears unremarkable as imaged in the noncontrast state. The gallbladder is incompletely distended. There is a nitrogen containing gallstone, but no apparent significant gallbladder wall  thickening, surrounding inflammation or biliary dilatation. Pancreas: Unremarkable. No pancreatic ductal dilatation or surrounding inflammatory changes. Spleen: Normal in size without focal abnormality. Adrenals/Urinary Tract: Both adrenal glands appear normal. There are enlarging low-density renal lesions bilaterally, likely all cysts. No suspicious renal findings on noncontrast imaging. No evidence of urinary tract calculus, hydronephrosis or perinephric soft tissue stranding. The bladder is incompletely distended with possible mild wall thickening. Stomach/Bowel: No evidence of bowel wall thickening, distention or surrounding inflammatory change. The cecum is in the right upper quadrant of the abdomen. The appendix appears normal. There is fluid throughout the proximal to mid colon. Mild descending and sigmoid colon diverticulosis. Vascular/Lymphatic: There are no enlarged abdominal or pelvic lymph nodes. Mild aortic and branch vessel atherosclerosis. No acute vascular findings on noncontrast imaging. Reproductive: The uterus and ovaries appear normal. No adnexal mass. Other: A periumbilical hernia containing only fat has mildly enlarged. There is no herniated bowel, ascites or free air. Musculoskeletal: No acute or significant osseous findings. Chronic bilateral femoral head avascular necrosis without subchondral collapse. Chronic progressive atrophy within the anterior musculature of both proximal thighs. IMPRESSION: 1. No evidence of urinary tract calculus or hydronephrosis. 2. Enlarging bilateral renal cysts. 3. Cholelithiasis without evidence of cholecystitis or biliary dilatation. 4. Fluid-filled proximal to mid colon as can be seen with diarrheal state. No evidence of acute inflammation. 5. Progressive chronic lung disease at both lung bases with associated architectural distortion, subpleural reticulation and bronchiectasis. Some of this could relate to resolving superimposed inflammation. 6. Chronic  bilateral femoral head avascular necrosis without subchondral collapse. 7. Aortic Atherosclerosis (ICD10-I70.0). Electronically Signed   By: Richardean Sale M.D.   On: 04/29/2020 15:06        Scheduled Meds: . raloxifene  60 mg Oral Daily  . sodium bicarbonate  650 mg Oral BID   Continuous Infusions: . ferumoxytol       LOS: 1 day    Time spent: 36 minutes spent on chart review, discussion with nursing staff, consultants, updating family and interview/physical exam; more than 50% of that time was spent in counseling and/or coordination of care.    Athel Merriweather J [place] DO [facility] Available via Epic secure chat 7am-7pm After these hours, please refer to coverage provider listed on [email or web address] 04/30/2020, 3:05 PM

## 2020-04-30 NOTE — Progress Notes (Addendum)
IR requested by Dr. Royce Macadamia for possible image-guided random renal biopsy.  Due to [place] availability, plan for procedure tentatively tomorrow 05/01/2020 in IR. Patient will be Npo at midnight- order placed. RN aware.  Please call IR with questions/concerns.   ADDENDUM: [place] had a cancellation so able to accomodate procedure today. Spoke with RN who states patient is still NPO- instructed RN that patient to remain NPO. Plan for procedure tentatively for today in IR at 1300.   Bea Graff Shya Kovatch, PA-C 04/30/2020, 11:06 AM

## 2020-04-30 NOTE — Progress Notes (Signed)
Initial Nutrition Assessment  RD working remotely.  DOCUMENTATION CODES:   Morbid obesity  INTERVENTION:   -Boost Breeze po BID, each supplement provides 250 kcal and 9 grams of protein  NUTRITION DIAGNOSIS:   Inadequate oral intake related to decreased appetite(altered taste perception) as evidenced by per patient/family report.  GOAL:   Patient will meet greater than or equal to 90% of their needs  MONITOR:   PO intake, Supplement acceptance, Labs, Weight trends, Skin, I & O's  REASON FOR ASSESSMENT:   Malnutrition Screening Tool    ASSESSMENT:   Cheryl [name] is a 74 y.o. female with medical history significant for Hashimoto's, diverticulosis.  Patient presented to the ED with complaints of persistent and gradually worsening difficulty breathing over the past month since she was hospitalized.  She reports persistent dry cough.  No fevers no chills.  No chest pain.  No lower extremity swelling.  Pt admitted with acute kidney failure.   5/12- s/p renal biopsy  Attempted to speak with pt via phone, however, no answer.   Per nephrology notes, pt with poor appetite and complains of metallic taste in mouth. No indications for HD at this time. Plan for renal biopsy today.   Pt just advanced to a renal diet. No meal completion data currently available at this time.   Reviewed wt hx; pt has experienced a 3.7% wt loss over the past month, which is not significant for time frame.   Medications reviewed and include sodium bicarbonate.   Labs reviewed: K: 3.2.   Diet Order:   Diet Order            Diet renal with fluid restriction Fluid restriction: 1200 mL Fluid; Room service appropriate? Yes; Fluid consistency: Thin  Diet effective now              EDUCATION NEEDS:   No education needs have been identified at this time  Skin:  Skin Assessment: Skin Integrity Issues: Skin Integrity Issues:: Incisions Incisions: closed rt back  Last BM:  Unknown  Height:    Ht Readings from Last 1 Encounters:  04/29/20 4\' 11"  (1.499 m)    Weight:   Wt Readings from Last 1 Encounters:  04/29/20 92.6 kg    Ideal Body Weight:  44.5 kg  BMI:  Body mass index is 41.24 kg/m.  Estimated Nutritional Needs:   Kcal:  1400-1600  Protein:  65-80 grams  Fluid:  1.2 L    Loistine Chance, RD, LDN, [place] Registered Dietitian II Certified Diabetes Care and Education Specialist Please refer to [employer] for RD and/or RD on-call/weekend/after hours pager

## 2020-04-30 NOTE — Procedures (Signed)
Pre procedural Dx: Acute on chronic renal failure  Post procedural Dx: Same  Technically successful CT guided biopsy of inferior pole of the right kidney.   EBL: None.  Complications: None immediate.   Ronny Bacon, MD Pager #: [phone]

## 2020-05-01 DIAGNOSIS — E875 Hyperkalemia: Secondary | ICD-10-CM

## 2020-05-01 LAB — CBC
HCT: 27.9 % — ABNORMAL LOW (ref 36.0–46.0)
Hemoglobin: 8.4 g/dL — ABNORMAL LOW (ref 12.0–15.0)
MCH: 28.5 pg (ref 26.0–34.0)
MCHC: 30.1 g/dL (ref 30.0–36.0)
MCV: 94.6 fL (ref 80.0–100.0)
Platelets: 720 10*3/uL — ABNORMAL HIGH (ref 150–400)
RBC: 2.95 MIL/uL — ABNORMAL LOW (ref 3.87–5.11)
RDW: 16.8 % — ABNORMAL HIGH (ref 11.5–15.5)
WBC: 13.8 10*3/uL — ABNORMAL HIGH (ref 4.0–10.5)
nRBC: 0 % (ref 0.0–0.2)

## 2020-05-01 LAB — RENAL FUNCTION PANEL
Albumin: 2.5 g/dL — ABNORMAL LOW (ref 3.5–5.0)
Anion gap: 13 (ref 5–15)
BUN: 52 mg/dL — ABNORMAL HIGH (ref 8–23)
CO2: 18 mmol/L — ABNORMAL LOW (ref 22–32)
Calcium: 8.7 mg/dL — ABNORMAL LOW (ref 8.9–10.3)
Chloride: 106 mmol/L (ref 98–111)
Creatinine, Ser: 6.97 mg/dL — ABNORMAL HIGH (ref 0.44–1.00)
GFR calc Af Amer: 6 mL/min — ABNORMAL LOW (ref >60)
GFR calc non Af Amer: 5 mL/min — ABNORMAL LOW (ref >60)
Glucose, Bld: 107 mg/dL — ABNORMAL HIGH (ref 70–99)
Phosphorus: 4.7 mg/dL — ABNORMAL HIGH (ref 2.5–4.6)
Potassium: 5.7 mmol/L — ABNORMAL HIGH (ref 3.5–5.1)
Sodium: 137 mmol/L (ref 135–145)

## 2020-05-01 LAB — PROTEIN ELECTROPHORESIS, SERUM
A/G Ratio: 0.7 (ref 0.7–1.7)
Albumin ELP: 2.8 g/dL — ABNORMAL LOW (ref 2.9–4.4)
Alpha-1-Globulin: 0.4 g/dL (ref 0.0–0.4)
Alpha-2-Globulin: 1.1 g/dL — ABNORMAL HIGH (ref 0.4–1.0)
Beta Globulin: 1.1 g/dL (ref 0.7–1.3)
Gamma Globulin: 1.7 g/dL (ref 0.4–1.8)
Globulin, Total: 4.2 g/dL — ABNORMAL HIGH (ref 2.2–3.9)
Total Protein ELP: 7 g/dL (ref 6.0–8.5)

## 2020-05-01 LAB — ANA: Anti Nuclear Antibody (ANA): POSITIVE — AB

## 2020-05-01 LAB — C4 COMPLEMENT: Complement C4, Body Fluid: 26 mg/dL (ref 12–38)

## 2020-05-01 LAB — C3 COMPLEMENT: C3 Complement: 135 mg/dL (ref 82–167)

## 2020-05-01 LAB — GLOMERULAR BASEMENT MEMBRANE ANTIBODIES: GBM Ab: 8 units (ref 0–20)

## 2020-05-01 LAB — KAPPA/LAMBDA LIGHT CHAINS
Kappa free light chain: 240.7 mg/L — ABNORMAL HIGH (ref 3.3–19.4)
Kappa, lambda light chain ratio: 1.32 (ref 0.26–1.65)
Lambda free light chains: 181.8 mg/L — ABNORMAL HIGH (ref 5.7–26.3)

## 2020-05-01 LAB — MPO/PR-3 (ANCA) ANTIBODIES
ANCA Proteinase 3: <3.5 U/mL (ref 0.0–3.5)
Myeloperoxidase Abs: 29.6 U/mL — ABNORMAL HIGH (ref 0.0–9.0)

## 2020-05-01 MED ORDER — SODIUM ZIRCONIUM CYCLOSILICATE 10 G PO PACK
10.0000 g | PACK | Freq: Every day | ORAL | Status: DC
  Administered 2020-05-01 – 2020-05-04 (×4): 10 g via ORAL
  Filled 2020-05-01 (×4): qty 1

## 2020-05-01 MED ORDER — FOLIC ACID 1 MG PO TABS
1.0000 mg | ORAL_TABLET | Freq: Every day | ORAL | Status: DC
  Administered 2020-05-01 – 2020-05-07 (×7): 1 mg via ORAL
  Filled 2020-05-01 (×7): qty 1

## 2020-05-01 MED ORDER — CYCLOPHOSPHAMIDE 25 MG PO CAPS
100.0000 mg | ORAL_CAPSULE | Freq: Every day | ORAL | Status: DC
  Administered 2020-05-01 – 2020-05-07 (×7): 100 mg via ORAL
  Filled 2020-05-01 (×6): qty 4
  Filled 2020-05-01: qty 2
  Filled 2020-05-01: qty 4

## 2020-05-01 MED ORDER — PANTOPRAZOLE SODIUM 40 MG PO TBEC
40.0000 mg | DELAYED_RELEASE_TABLET | Freq: Every day | ORAL | Status: DC
  Administered 2020-05-01 – 2020-05-07 (×7): 40 mg via ORAL
  Filled 2020-05-01 (×7): qty 1

## 2020-05-01 MED ORDER — SODIUM CHLORIDE 0.9 % IV SOLN
1000.0000 mg | Freq: Every day | INTRAVENOUS | Status: AC
  Administered 2020-05-01 – 2020-05-03 (×3): 1000 mg via INTRAVENOUS
  Filled 2020-05-01 (×3): qty 8

## 2020-05-01 NOTE — Progress Notes (Signed)
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  65% crescents with minimal scarring and also ATN. Discussed the results with Ms Lai and will start treatment tonight with high dose steroids and oral cytoxan   Cheryl [name]

## 2020-05-01 NOTE — Progress Notes (Signed)
PROGRESS NOTE    BREUNA LOVEALL  ZDG:[id number] DOB: 04-24-1946 DOA: 04/29/2020 PCP: Sharilyn Sites, MD    Brief Narrative:  Cheryl [name] is a 74 y.o. female with medical history significant for Hashimoto's, diverticulosis.  Patient presented to the ED with complaints of persistent and gradually worsening difficulty breathing over the past month since she was hospitalized.  She reports persistent dry cough.  No fevers no chills.  No chest pain.  No lower extremity swelling. Patient saw her primary care provider, and was referred to the ED due to elevated creatinine. She denies nausea, vomiting or loose stools, she reports stable oral intake, she reports recent NSAID use.  She denies diuretic use.  No family history of lung disease that she is aware of.  She reports generalized abdominal queasiness over the past 1 to 2 weeks.  She denies urinary frequency or dysuria.  Recent hospitalization 4/8- 4/10, treated for multifocal pneumonia with acute hypoxic respiratory failure, treated with ceftriaxone and azithromycin, inpatient and discharged on a Omnicef and azithromycin.  She did not require supplemental oxygen on discharge.   ED Course: Stable vitals.  Creatinine elevated at 7.11.  Unremarkable BNP 44. Platelets elevated at 808.  Hemoglobin low at 9.5.  Likely large hemoglobin, protein, positive leukocytes, greater than 50 WBCs, no bacteria.  2 view chest xray shows ill-defined opacities throughout the lungs, stable or slightly worsened since prior study.  1 L bolus normal saline given, IV ceftriaxone started. Hospitalist to admit for acute kidney injury.    Assessment & Plan:   Principal Problem:   ARF (acute renal failure) (HCC) Active Problems:   Diverticulosis of colon without hemorrhage   Hashimoto's thyroiditis   Acute renal failure Renal cysts Creatinine markedly elevated at 7.11 with a BUN of 62 on ED presentation.  Urinalysis showing hemoglobin and protein.  CT renal stone study  with no urinary tract calculus or hydronephrosis with enlarging bilateral renal cyst.  Ultrasound renal with no hydronephrosis or parenchymal abnormality with multiple simple renal cyst appreciated.  Routine/creatinine ratio elevated 0.37.  Given proteinuria, concern for glomerulonephritis.  Complement C3/C4 within normal limits. --Nephrology following, appreciate assistance --Cr 7.11>7.14>6.97 (1.4 one month prior) --Underwent CT-guided renal biopsy on 04/30/2020, pathology pending --Kappa/lambda light chain: Pending --SPEP: Pending --GBM antibodies: Pending --ANA: Pending --ANCA: Pending --Avoid nephrotoxins, renally dose all medications --Strict I's and O's, specifically close monitoring of urinary output --Started on sodium bicarbonate 650 mg p.o. twice daily --Follow BMP daily  Hyperkalemia Potassium 5.7 today --Started on Lokelma 10 g p.o. daily --Continue close monitoring of electrolytes  Acute on chronic anemia  Hemoglobin 8.2, MCV 95.1, stable.  Iron 17, TIBC 239, ferritin 425, folate 5.3, B12 569, reticulocyte count count 1.4  --Receiving Feraheme IV today --Continue to monitor CBC, transfuse for hemoglobin less than 7.0  Pyuria Urinalysis with moderate leukoesterase, negative nitrite, no bacteria, greater than 50 WBCs.  Urine culture with no growth.  Initially was started on ceftriaxone, now discontinued.   DVT prophylaxis: SCDs Code Status: Full code Family Communication: Updated patient's family present at bedside  Disposition Plan:  Status is: Inpatient  Remains inpatient appropriate because:Persistent severe electrolyte disturbances, Ongoing diagnostic testing needed not appropriate for outpatient work up, Unsafe d/c plan and Inpatient level of care appropriate due to severity of illness   Dispo: The patient is from: Home              Anticipated d/c is to: Home  Anticipated d/c date is: 3 days              Patient currently is not medically stable  to d/c.   Consultants:   Nephrology  Procedures:   CT-guided renal biopsy 04/30/2020  Antimicrobials:   Ceftriaxone  5/11-5/11   Subjective: Patient seen and examined bedside, resting comfortably.  Continues with fatigue, nausea, and "queasiness".  Underwent CT-guided renal biopsy yesterday, pending pathology.  Renal function slightly improved today.  Continues to report good urine output.  No other complaints or concerns at this time.  Denies headache, no fever/chills/night sweats, nausea/vomiting, no diarrhea, no chest pain, no palpitations, no shortness of breath.  No acute events overnight per nursing staff.  Objective: Vitals:   04/30/20 1553 04/30/20 2055 05/01/20 0443 05/01/20 0447  BP: 136/80 139/72 (!) 102/57   Pulse: 98 97 (!) 108   Resp: 18 20 20    Temp: 98.7 F (37.1 C) 99.4 F (37.4 C) 99.3 F (37.4 C)   TempSrc: Oral Oral    SpO2: 93% 96% 100%   Weight:    94.8 kg  Height:        Intake/Output Summary (Last 24 hours) at 05/01/2020 1230 Last data filed at 05/01/2020 0447 Gross per 24 hour  Intake 120 ml  Output 200 ml  Net -80 ml   Filed Weights   04/29/20 1313 04/29/20 2302 05/01/20 0447  Weight: 90.3 kg 92.6 kg 94.8 kg    Examination:  General exam: Appears calm and comfortable  Respiratory system: Clear to auscultation. Respiratory effort normal. Cardiovascular system: S1 & S2 heard, RRR. No JVD, murmurs, rubs, gallops or clicks. No pedal edema. Gastrointestinal system: Abdomen is nondistended, soft and nontender. No organomegaly or masses felt. Normal bowel sounds heard. Central nervous system: Alert and oriented. No focal neurological deficits. Extremities: Symmetric 5 x 5 power. Skin: No rashes, lesions or ulcers Psychiatry: Judgement and insight appear normal. Mood & affect appropriate.     Data Reviewed: I have personally reviewed following labs and imaging studies  CBC: Recent Labs  Lab 04/29/20 1424 04/30/20 0827 05/01/20 0138    WBC 11.0* 11.7* 13.8*  NEUTROABS 8.1*  --   --   HGB 9.5* 8.2* 8.4*  HCT 31.9* 27.4* 27.9*  MCV 97.3 95.1 94.6  PLT 808* 695* 017*   Basic Metabolic Panel: Recent Labs  Lab 04/29/20 1424 04/30/20 0827 05/01/20 0138  NA 137 139 137  K 4.8 5.2* 5.7*  CL 103 109 106  CO2 21* 19* 18*  GLUCOSE 112* 106* 107*  BUN 62* 54* 52*  CREATININE 7.11* 7.14* 6.97*  CALCIUM 9.1 8.7* 8.7*  PHOS  --   --  4.7*   GFR: Estimated Creatinine Clearance: 7.3 mL/min (A) (by C-G formula based on SCr of 6.97 mg/dL (H)). Liver Function Tests: Recent Labs  Lab 04/29/20 1424 05/01/20 0138  AST 19  --   ALT 10  --   ALKPHOS 78  --   BILITOT 0.4  --   PROT 8.5*  --   ALBUMIN 3.3* 2.5*   No results for input(s): LIPASE, AMYLASE in the last 168 hours. No results for input(s): AMMONIA in the last 168 hours. Coagulation Profile: Recent Labs  Lab 04/30/20 1106  INR 1.1   Cardiac Enzymes: No results for input(s): CKTOTAL, CKMB, CKMBINDEX, TROPONINI in the last 168 hours. BNP (last 3 results) No results for input(s): PROBNP in the last 8760 hours. HbA1C: No results for input(s): HGBA1C in the last 72  hours. CBG: No results for input(s): GLUCAP in the last 168 hours. Lipid Profile: No results for input(s): CHOL, HDL, LDLCALC, TRIG, CHOLHDL, LDLDIRECT in the last 72 hours. Thyroid Function Tests: No results for input(s): TSH, T4TOTAL, FREET4, T3FREE, THYROIDAB in the last 72 hours. Anemia Panel: Recent Labs    04/30/20 1140  VITAMINB12 569  FOLATE 5.3*  FERRITIN 425*  TIBC 239*  IRON 17*  RETICCTPCT 1.4   Sepsis Labs: No results for input(s): PROCALCITON, LATICACIDVEN in the last 168 hours.  Recent Results (from the past 240 hour(s))  Urine culture     Status: None   Collection Time: 04/29/20  2:21 PM   Specimen: Urine, Random  Result Value Ref Range Status   Specimen Description   Final    URINE, RANDOM Performed at [facility], [address] [place], [place] [address]     Special Requests   Final    NONE Performed at [facility], [address], [place], [place] [address]    Culture   Final    NO GROWTH Performed at [facility], [place] [address], [place], [place] [address]    Report Status 04/30/2020 FINAL  Final  SARS Coronavirus 2 by RT PCR (hospital order, performed in [facility] hospital lab) Nasopharyngeal Nasopharyngeal Swab     Status: None   Collection Time: 04/29/20  2:23 PM   Specimen: Nasopharyngeal Swab  Result Value Ref Range Status   SARS Coronavirus 2 NEGATIVE NEGATIVE Final    Comment: (NOTE) SARS-CoV-2 target nucleic acids are NOT DETECTED. The SARS-CoV-2 RNA is generally detectable in upper and lower respiratory specimens during the acute phase of infection. The lowest concentration of SARS-CoV-2 viral copies this assay can detect is 250 copies / mL. A negative result does not preclude SARS-CoV-2 infection and should not be used as the sole basis for treatment or other patient management decisions.  A negative result may occur with improper specimen collection / handling, submission of specimen other than nasopharyngeal swab, presence of viral mutation(s) within the areas targeted by this assay, and inadequate number of viral copies (<250 copies / mL). A negative result must be combined with clinical observations, patient history, and epidemiological information. Fact Sheet for Patients:   StrictlyIdeas.no Fact Sheet for Healthcare Providers: BankingDealers.co.za This test is not yet approved or cleared  by the [place] FDA and has been authorized for detection and/or diagnosis of SARS-CoV-2 by FDA under an Emergency Use Authorization (EUA).  This EUA will remain in effect (meaning this test can be used) for the duration of the COVID-19 declaration under Section 564(b)(1) of the Act, 21 U.S.C. section 360bbb-3(b)(1), unless the authorization is terminated or revoked  sooner. Performed at [facility], [address], [place], [place] [address]          Radiology Studies: DG Chest 2 View  Result Date: 04/29/2020 CLINICAL DATA:  Shortness of breath EXAM: CHEST - 2 VIEW COMPARISON:  04/25/2020 FINDINGS: Ill-defined pulmonary opacities are again noted within the lungs, stable are slightly increased since prior study. Heart is normal size. Linear scarring or atelectasis in the lingula. No effusions or pneumothorax. No acute bony abnormality. IMPRESSION: Ill-defined opacities throughout the lungs, stable or slightly worsened since prior study. Electronically Signed   By: Rolm Baptise M.D.   On: 04/29/2020 14:02   US RENAL  Result Date: 04/30/2020 CLINICAL DATA:  Acute kidney injury EXAM: RENAL / URINARY TRACT ULTRASOUND COMPLETE COMPARISON:  None. FINDINGS: Right Kidney: Renal measurements:  9.2 x 4.5 x 4.6 cm = volume: 95.6 mL. There are 2 discrete simple renal cysts. The larger measures 3.7 x 3.1 x 3.2 cm and the smaller measures 2.6 x 3.0 x 2.9 cm. No hydronephrosis. Normal renal echogenicity. No shadowing calculi. Left Kidney: Renal measurements: 9.8 x 5.4 x 5.4 cm = volume: 151.1 mL. There is a simple renal cyst measuring 3.3 x 2.9 x 3 6 cm. No hydronephrosis or shadowing calculus. Normal parenchymal echogenicity. Bladder: Appears normal for degree of bladder distention. Other: None. IMPRESSION: No hydronephrosis or parenchymal abnormality. Multiple simple renal cysts. Electronically Signed   By: Ulyses Jarred M.D.   On: 04/30/2020 00:04   CT BIOPSY  Result Date: 04/30/2020 INDICATION: Acute on chronic renal insufficiency. Please perform CT-guided biopsy for tissue diagnostic purposes. EXAM: CT-GUIDED RANDOM RENAL BIOPSY COMPARISON:  Renal ultrasound-04/30/2020; CT abdomen pelvis-04/29/2020 MEDICATIONS: None. ANESTHESIA/SEDATION: Fentanyl 25 mcg IV; Versed 0.5 mg IV Sedation time: 14 minutes; The patient was continuously monitored during the procedure by the  interventional radiology nurse under my direct supervision. CONTRAST:  None. COMPLICATIONS: None immediate. PROCEDURE: Given presence of known multiple bilateral renal cysts, the decision was made to pursue right renal biopsy with CT guidance. Informed consent was obtained from the patient following an explanation of the procedure, risks, benefits and alternatives. A time out was performed prior to the initiation of the procedure. The patient was positioned prone on the CT table and a limited CT was performed for procedural planning demonstrating multiple hypoattenuating renal cysts bilaterally. The inferior pole within the right kidney was targeted for random renal biopsy given lack of adjacent cysts as well as adequate percutaneous window. The procedure was planned. The operative site was prepped and draped in the usual sterile fashion. Appropriate trajectory was confirmed with a 22 gauge spinal needle after the adjacent tissues were anesthetized with 1% Lidocaine with epinephrine. Under intermittent CT guidance, a 17 gauge coaxial needle was advanced into the peripheral aspect of inferolateral aspect of the right kidney. Appropriate positioning was confirmed and 3 core needle biopsy samples were obtained with an 18 gauge core needle biopsy device. The co-axial needle was removed following administration of a Gel-Foam slurry and superficial hemostasis was achieved with manual compression. A limited postprocedural CT was negative for hemorrhage or additional complication. A dressing was placed. The patient tolerated the procedure well without immediate postprocedural complication. IMPRESSION: Technically successful CT guided core needle biopsy of inferior pole of the right kidney. Electronically Signed   By: Sandi Mariscal M.D.   On: 04/30/2020 15:17   CT RENAL STONE STUDY  Result Date: 04/29/2020 CLINICAL DATA:  Nonspecific flank pain with discolored urine. No reported history of renal calculi. EXAM: CT ABDOMEN  AND PELVIS WITHOUT CONTRAST TECHNIQUE: Multidetector CT imaging of the abdomen and pelvis was performed following the standard protocol without IV contrast. COMPARISON:  Abdominopelvic CT 02/19/2012. Chest radiographs 04/29/2020 and 11/19/2016. FINDINGS: Lower chest: Chronic lung disease with diffuse bronchiectasis, subpleural reticulation and architectural distortion at both lung bases. There are scattered ill-defined nodular densities which could reflect scarring or inflammation. No discrete pulmonary nodules. No significant pleural or pericardial effusion. Hepatobiliary: The liver appears unremarkable as imaged in the noncontrast state. The gallbladder is incompletely distended. There is a nitrogen containing gallstone, but no apparent significant gallbladder wall thickening, surrounding inflammation or biliary dilatation. Pancreas: Unremarkable. No pancreatic ductal dilatation or surrounding inflammatory changes. Spleen: Normal in size without focal abnormality. Adrenals/Urinary Tract: Both adrenal glands appear normal. There are enlarging low-density  renal lesions bilaterally, likely all cysts. No suspicious renal findings on noncontrast imaging. No evidence of urinary tract calculus, hydronephrosis or perinephric soft tissue stranding. The bladder is incompletely distended with possible mild wall thickening. Stomach/Bowel: No evidence of bowel wall thickening, distention or surrounding inflammatory change. The cecum is in the right upper quadrant of the abdomen. The appendix appears normal. There is fluid throughout the proximal to mid colon. Mild descending and sigmoid colon diverticulosis. Vascular/Lymphatic: There are no enlarged abdominal or pelvic lymph nodes. Mild aortic and branch vessel atherosclerosis. No acute vascular findings on noncontrast imaging. Reproductive: The uterus and ovaries appear normal. No adnexal mass. Other: A periumbilical hernia containing only fat has mildly enlarged. There is no  herniated bowel, ascites or free air. Musculoskeletal: No acute or significant osseous findings. Chronic bilateral femoral head avascular necrosis without subchondral collapse. Chronic progressive atrophy within the anterior musculature of both proximal thighs. IMPRESSION: 1. No evidence of urinary tract calculus or hydronephrosis. 2. Enlarging bilateral renal cysts. 3. Cholelithiasis without evidence of cholecystitis or biliary dilatation. 4. Fluid-filled proximal to mid colon as can be seen with diarrheal state. No evidence of acute inflammation. 5. Progressive chronic lung disease at both lung bases with associated architectural distortion, subpleural reticulation and bronchiectasis. Some of this could relate to resolving superimposed inflammation. 6. Chronic bilateral femoral head avascular necrosis without subchondral collapse. 7. Aortic Atherosclerosis (ICD10-I70.0). Electronically Signed   By: Richardean Sale M.D.   On: 04/29/2020 15:06        Scheduled Meds: . feeding supplement  1 Container Oral BID BM  . folic acid  1 mg Oral Daily  . raloxifene  60 mg Oral Daily  . sodium bicarbonate  650 mg Oral BID  . sodium zirconium cyclosilicate  10 g Oral Daily   Continuous Infusions:    LOS: 2 days    Time spent: 36 minutes spent on chart review, discussion with nursing staff, consultants, updating family and interview/physical exam; more than 50% of that time was spent in counseling and/or coordination of care.    Aloria Looper J [place] DO [facility] Available via Epic secure chat 7am-7pm After these hours, please refer to coverage provider listed on [email or web address] 05/01/2020, 12:30 PM

## 2020-05-01 NOTE — Progress Notes (Addendum)
[facility] Progress Note    Assessment/ Plan:   Cheryl [name] is a 74 y.o. female with PMH GERD, arthritis, porior sleep apnea, and constipation, who presented with worsening of outpatient labs with worsened renal failure, Cr had increased from 1.4 to 7.11 in 1 month.  1. AKI - also with hematuria and proteinuria, causing concern for GN.  Patient also with recent NSAID use likely contributing.  ANA, ANCA, PR3 Ab, anti-GBM, SPEP, free light chains pending.  Complement WNL.  S/P Renal biopsy on 5/12 per IR. UOP 200cc with 3 unmeasured occurrences, reports urinating a large amount which she states is normal for her. Mildly acidotic, bicarb 18, started on sodium bicarb.  Cr essentially unchanged from 7.14 to 6.97, GFR stable at 6.  Will follow up biopsy results and GN workup.  Having some possible symptoms of uremia including fatigue, feeling "off," and decreased appetite. 2. Hyperkalemia - K 5.7, started lokelma QD. 3. Proteinuria - Protein Creatinine ratio 0.37.  GN workup per above. 4. Hematuria - >50 RBC, GN workup per above. 5. Dyspnea - Concern for pulmonary-renal syndrome.    Work-up per above. 6. HTN - new diagnosis per patient.  On labetalol prn SBP >170.  Improving, BP this AM 102/57. 7. Normocytic Anemia - Hgb stable at 8.4.  No indication for transfusion at this time.  Iron low at 17, low TIBC at 239, high ferritin at 425, low folate at 5.3, B12 WNL.  Likely multifactorial with ACD, IDA, and low folate.  S/P feraheme on 5/12- will need redose on 5/15.  Start folic acid. 8. Leukocyturia - urine culture from 5/11 no growth, Abx d/c'ed.   9. Renal Cysts - seen on CT renal stone study, Renal US with 3 simple renal cysts, 2 on right measuring 3.7 x 3.1 x 3.2 cm and 2.6 x 3.0 x 2.9 cm, with 1 on left measuring 3.3 x 2.9 x 3.6 cm. Not as dramatic on u/s-  Not indicative of a polycystic dz   Subjective:   Renal biopsy performed on 5/12.  UOP 200cc with 3 unmeasured.  Patient  reports continued frequent urinations, which she states is normal for her.  States that she is feeling "off," has lack of appetite, and has fatigue.    Objective:   BP (!) 102/57 (BP Location: Left Arm)   Pulse (!) 108   Temp 99.3 F (37.4 C)   Resp 20   Ht 4\' 11"  (1.499 m)   Wt 94.8 kg   SpO2 100%   BMI 42.21 kg/m   Intake/Output Summary (Last 24 hours) at 05/01/2020 3559 Last data filed at 05/01/2020 0447 Gross per 24 hour  Intake 120 ml  Output 200 ml  Net -80 ml   Weight change: 4.534 kg  Physical Exam: General: 74 y.o. female in NAD Cardio: RRR no m/r/g Lungs: CTAB, no wheezing, no rhonchi, no crackles, no IWOB on RA Abdomen: Soft, non-tender to palpation, non-distended Skin: warm and dry Extremities: No edema   Imaging: DG Chest 2 View  Result Date: 04/29/2020 CLINICAL DATA:  Shortness of breath EXAM: CHEST - 2 VIEW COMPARISON:  04/25/2020 FINDINGS: Ill-defined pulmonary opacities are again noted within the lungs, stable are slightly increased since prior study. Heart is normal size. Linear scarring or atelectasis in the lingula. No effusions or pneumothorax. No acute bony abnormality. IMPRESSION: Ill-defined opacities throughout the lungs, stable or slightly worsened since prior study. Electronically Signed   By: Rolm Baptise M.D.   On:  04/29/2020 14:02   US RENAL  Result Date: 04/30/2020 CLINICAL DATA:  Acute kidney injury EXAM: RENAL / URINARY TRACT ULTRASOUND COMPLETE COMPARISON:  None. FINDINGS: Right Kidney: Renal measurements: 9.2 x 4.5 x 4.6 cm = volume: 95.6 mL. There are 2 discrete simple renal cysts. The larger measures 3.7 x 3.1 x 3.2 cm and the smaller measures 2.6 x 3.0 x 2.9 cm. No hydronephrosis. Normal renal echogenicity. No shadowing calculi. Left Kidney: Renal measurements: 9.8 x 5.4 x 5.4 cm = volume: 151.1 mL. There is a simple renal cyst measuring 3.3 x 2.9 x 3 6 cm. No hydronephrosis or shadowing calculus. Normal parenchymal echogenicity. Bladder:  Appears normal for degree of bladder distention. Other: None. IMPRESSION: No hydronephrosis or parenchymal abnormality. Multiple simple renal cysts. Electronically Signed   By: Ulyses Jarred M.D.   On: 04/30/2020 00:04   CT BIOPSY  Result Date: 04/30/2020 INDICATION: Acute on chronic renal insufficiency. Please perform CT-guided biopsy for tissue diagnostic purposes. EXAM: CT-GUIDED RANDOM RENAL BIOPSY COMPARISON:  Renal ultrasound-04/30/2020; CT abdomen pelvis-04/29/2020 MEDICATIONS: None. ANESTHESIA/SEDATION: Fentanyl 25 mcg IV; Versed 0.5 mg IV Sedation time: 14 minutes; The patient was continuously monitored during the procedure by the interventional radiology nurse under my direct supervision. CONTRAST:  None. COMPLICATIONS: None immediate. PROCEDURE: Given presence of known multiple bilateral renal cysts, the decision was made to pursue right renal biopsy with CT guidance. Informed consent was obtained from the patient following an explanation of the procedure, risks, benefits and alternatives. A time out was performed prior to the initiation of the procedure. The patient was positioned prone on the CT table and a limited CT was performed for procedural planning demonstrating multiple hypoattenuating renal cysts bilaterally. The inferior pole within the right kidney was targeted for random renal biopsy given lack of adjacent cysts as well as adequate percutaneous window. The procedure was planned. The operative site was prepped and draped in the usual sterile fashion. Appropriate trajectory was confirmed with a 22 gauge spinal needle after the adjacent tissues were anesthetized with 1% Lidocaine with epinephrine. Under intermittent CT guidance, a 17 gauge coaxial needle was advanced into the peripheral aspect of inferolateral aspect of the right kidney. Appropriate positioning was confirmed and 3 core needle biopsy samples were obtained with an 18 gauge core needle biopsy device. The co-axial needle was  removed following administration of a Gel-Foam slurry and superficial hemostasis was achieved with manual compression. A limited postprocedural CT was negative for hemorrhage or additional complication. A dressing was placed. The patient tolerated the procedure well without immediate postprocedural complication. IMPRESSION: Technically successful CT guided core needle biopsy of inferior pole of the right kidney. Electronically Signed   By: Sandi Mariscal M.D.   On: 04/30/2020 15:17   CT RENAL STONE STUDY  Result Date: 04/29/2020 CLINICAL DATA:  Nonspecific flank pain with discolored urine. No reported history of renal calculi. EXAM: CT ABDOMEN AND PELVIS WITHOUT CONTRAST TECHNIQUE: Multidetector CT imaging of the abdomen and pelvis was performed following the standard protocol without IV contrast. COMPARISON:  Abdominopelvic CT 02/19/2012. Chest radiographs 04/29/2020 and 11/19/2016. FINDINGS: Lower chest: Chronic lung disease with diffuse bronchiectasis, subpleural reticulation and architectural distortion at both lung bases. There are scattered ill-defined nodular densities which could reflect scarring or inflammation. No discrete pulmonary nodules. No significant pleural or pericardial effusion. Hepatobiliary: The liver appears unremarkable as imaged in the noncontrast state. The gallbladder is incompletely distended. There is a nitrogen containing gallstone, but no apparent significant gallbladder wall thickening, surrounding inflammation  or biliary dilatation. Pancreas: Unremarkable. No pancreatic ductal dilatation or surrounding inflammatory changes. Spleen: Normal in size without focal abnormality. Adrenals/Urinary Tract: Both adrenal glands appear normal. There are enlarging low-density renal lesions bilaterally, likely all cysts. No suspicious renal findings on noncontrast imaging. No evidence of urinary tract calculus, hydronephrosis or perinephric soft tissue stranding. The bladder is incompletely  distended with possible mild wall thickening. Stomach/Bowel: No evidence of bowel wall thickening, distention or surrounding inflammatory change. The cecum is in the right upper quadrant of the abdomen. The appendix appears normal. There is fluid throughout the proximal to mid colon. Mild descending and sigmoid colon diverticulosis. Vascular/Lymphatic: There are no enlarged abdominal or pelvic lymph nodes. Mild aortic and branch vessel atherosclerosis. No acute vascular findings on noncontrast imaging. Reproductive: The uterus and ovaries appear normal. No adnexal mass. Other: A periumbilical hernia containing only fat has mildly enlarged. There is no herniated bowel, ascites or free air. Musculoskeletal: No acute or significant osseous findings. Chronic bilateral femoral head avascular necrosis without subchondral collapse. Chronic progressive atrophy within the anterior musculature of both proximal thighs. IMPRESSION: 1. No evidence of urinary tract calculus or hydronephrosis. 2. Enlarging bilateral renal cysts. 3. Cholelithiasis without evidence of cholecystitis or biliary dilatation. 4. Fluid-filled proximal to mid colon as can be seen with diarrheal state. No evidence of acute inflammation. 5. Progressive chronic lung disease at both lung bases with associated architectural distortion, subpleural reticulation and bronchiectasis. Some of this could relate to resolving superimposed inflammation. 6. Chronic bilateral femoral head avascular necrosis without subchondral collapse. 7. Aortic Atherosclerosis (ICD10-I70.0). Electronically Signed   By: Richardean Sale M.D.   On: 04/29/2020 15:06    Labs: BMET Recent Labs  Lab 04/29/20 1424 04/30/20 0827 05/01/20 0138  NA 137 139 137  K 4.8 5.2* 5.7*  CL 103 109 106  CO2 21* 19* 18*  GLUCOSE 112* 106* 107*  BUN 62* 54* 52*  CREATININE 7.11* 7.14* 6.97*  CALCIUM 9.1 8.7* 8.7*  PHOS  --   --  4.7*   CBC Recent Labs  Lab 04/29/20 1424 04/30/20 0827  05/01/20 0138  WBC 11.0* 11.7* 13.8*  NEUTROABS 8.1*  --   --   HGB 9.5* 8.2* 8.4*  HCT 31.9* 27.4* 27.9*  MCV 97.3 95.1 94.6  PLT 808* 695* 720*    Medications:    . feeding supplement  1 Container Oral BID BM  . raloxifene  60 mg Oral Daily  . sodium bicarbonate  650 mg Oral BID  . sodium zirconium cyclosilicate  10 g Oral Daily     [place] Constable, DO [facility] Resident, PGY 2 05/01/2020, 9:05 AM   Patient seen and examined, agree with above note with above modifications. Pt looks and feels well, does not seem uremic.  Received iron overnight, thinks is helped.  Renal function stable, not worse and no indications for HD.  Only labs back are complements that were WNL-  Other labs and biopsy report pending. Need to keep inpatient for now still  Corliss Parish, MD 05/01/2020

## 2020-05-02 DIAGNOSIS — E063 Autoimmune thyroiditis: Secondary | ICD-10-CM

## 2020-05-02 LAB — CBC
HCT: 26.6 % — ABNORMAL LOW (ref 36.0–46.0)
Hemoglobin: 8.2 g/dL — ABNORMAL LOW (ref 12.0–15.0)
MCH: 29 pg (ref 26.0–34.0)
MCHC: 30.8 g/dL (ref 30.0–36.0)
MCV: 94 fL (ref 80.0–100.0)
Platelets: 662 10*3/uL — ABNORMAL HIGH (ref 150–400)
RBC: 2.83 MIL/uL — ABNORMAL LOW (ref 3.87–5.11)
RDW: 16.3 % — ABNORMAL HIGH (ref 11.5–15.5)
WBC: 14.7 10*3/uL — ABNORMAL HIGH (ref 4.0–10.5)
nRBC: 0 % (ref 0.0–0.2)

## 2020-05-02 LAB — RENAL FUNCTION PANEL
Albumin: 2.5 g/dL — ABNORMAL LOW (ref 3.5–5.0)
Anion gap: 11 (ref 5–15)
BUN: 54 mg/dL — ABNORMAL HIGH (ref 8–23)
CO2: 19 mmol/L — ABNORMAL LOW (ref 22–32)
Calcium: 8.7 mg/dL — ABNORMAL LOW (ref 8.9–10.3)
Chloride: 104 mmol/L (ref 98–111)
Creatinine, Ser: 7.11 mg/dL — ABNORMAL HIGH (ref 0.44–1.00)
GFR calc Af Amer: 6 mL/min — ABNORMAL LOW (ref >60)
GFR calc non Af Amer: 5 mL/min — ABNORMAL LOW (ref >60)
Glucose, Bld: 188 mg/dL — ABNORMAL HIGH (ref 70–99)
Phosphorus: 4.7 mg/dL — ABNORMAL HIGH (ref 2.5–4.6)
Potassium: 5.4 mmol/L — ABNORMAL HIGH (ref 3.5–5.1)
Sodium: 134 mmol/L — ABNORMAL LOW (ref 135–145)

## 2020-05-02 MED ORDER — DARBEPOETIN ALFA 100 MCG/0.5ML IJ SOSY
100.0000 ug | PREFILLED_SYRINGE | INTRAMUSCULAR | Status: DC
  Administered 2020-05-02: 100 ug via SUBCUTANEOUS
  Filled 2020-05-02: qty 0.5

## 2020-05-02 MED ORDER — SODIUM CHLORIDE 0.9 % IV SOLN
510.0000 mg | Freq: Once | INTRAVENOUS | Status: AC
  Administered 2020-05-04: 510 mg via INTRAVENOUS
  Filled 2020-05-02: qty 17

## 2020-05-02 NOTE — Progress Notes (Addendum)
[facility] Progress Note    Assessment/ Plan:   Cheryl [name] is a 74 y.o. female with PMH GERD, arthritis, porior sleep apnea, and constipation, who presented with worsening of outpatient labs with worsened renal failure, Cr had increased from 1.4 to 7.11 in 1 month.  1. 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  Biopsy showed 65% crescents with minimal scarring and also ATN.  Started on solumedrol 1000mg  QD and oral cytoxan late on 5/13.  MPO ANCA elevated to 29.6, ANCA proteinase negative- so c/w an MPA.  Positive ANA, GBM AB neg, SPEP essentially WNL, Kappa light chains elevated to 240.7, lambda light chains elevated to 181.8, complement WNL.  Cr today essentially unchanged from admission, now 7.11 (7.14 on admission, 6.97 on 5/13).  GFR stable at 6.  Bicarb improving to 19. No indications for dialysis-  I hope will be able to salvage some renal function.  Decided against plasmapheresis due to lack of DAH 2. Hyperkalemia - K 5.4 today, improving on lokelma QD. 3. Proteinuria - Protein Creatinine ratio 0.37.  Likely 2/2 above. 4. Hematuria - >50 RBC, likely 2/2 above. 5. Dyspnea - improved, likely 2/2 above. 6. HTN - new diagnosis per patient.  On labetalol prn SBP >170.  Improving, BP 157/93. 7. Normocytic Anemia - Hgb stable at 8.2.  No indication for transfusion at this time.  Iron low at 17, low TIBC at 239, high ferritin at 425, low folate at 5.3, B12 WNL.  Likely multifactorial with ACD, IDA, and low folate.  S/P feraheme on 5/12- will need redose on 5/15.  On folic acid.  Will give dose of ESA as well  8. Leukocyturia - urine culture from 5/11 no growth, Abx d/c'ed.   9. Renal Cysts - seen on CT renal stone study, Renal US with 3 simple renal cysts, 2 on right measuring 3.7 x 3.1 x 3.2 cm and 2.6 x 3.0 x 2.9 cm, with 1 on left measuring 3.3 x 2.9 x 3.6 cm. Not as dramatic on u/s-  Not indicative of a polycystic dz  Patient seen and examined, agree  with above note with above modifications. Renal biopsy diagnosis noted above- microscopic polyangiitis, now on solumedrol and cytoxan-  Tolerating well so far.  Nonoliguric, not uremic and no indications for dialysis.  Hopefully with treatment will be able to salvage some renal function.  Once IV solumedrol is finished if otherwise stable may be able to be discharged to OP follow up   Corliss Parish, MD 05/02/2020      Subjective:   Biopsy resulted on 5/13 with pauci-immune necrotizing glomerulonephritis.  Patient was started on high dose steroids and cytoxan. Patient reports feeling well this AM.  Continues to report urinating a lot, UOP 500cc with 2 unmeasured.  States that breathing continues to be stable.   Objective:   BP (!) 157/93 (BP Location: Left Arm)   Pulse 97   Temp 98.4 F (36.9 C) (Oral)   Resp 17   Ht 4\' 11"  (1.499 m)   Wt 94.8 kg   SpO2 92%   BMI 42.21 kg/m   Intake/Output Summary (Last 24 hours) at 05/02/2020 3244 Last data filed at 05/01/2020 2359 Gross per 24 hour  Intake 372.52 ml  Output 300 ml  Net 72.52 ml   Weight change:   Physical Exam: General: 74 y.o. female in NAD Cardio: RRR no m/r/g Lungs: CTAB, no wheezing, no rhonchi, no crackles, no IWOB on RA Abdomen:  Soft, non-tender to palpation, non-distended, positive bowel sounds Skin: warm and dry Extremities: No edema   Imaging: CT BIOPSY  Result Date: 04/30/2020 INDICATION: Acute on chronic renal insufficiency. Please perform CT-guided biopsy for tissue diagnostic purposes. EXAM: CT-GUIDED RANDOM RENAL BIOPSY COMPARISON:  Renal ultrasound-04/30/2020; CT abdomen pelvis-04/29/2020 MEDICATIONS: None. ANESTHESIA/SEDATION: Fentanyl 25 mcg IV; Versed 0.5 mg IV Sedation time: 14 minutes; The patient was continuously monitored during the procedure by the interventional radiology nurse under my direct supervision. CONTRAST:  None. COMPLICATIONS: None immediate. PROCEDURE: Given presence of known  multiple bilateral renal cysts, the decision was made to pursue right renal biopsy with CT guidance. Informed consent was obtained from the patient following an explanation of the procedure, risks, benefits and alternatives. A time out was performed prior to the initiation of the procedure. The patient was positioned prone on the CT table and a limited CT was performed for procedural planning demonstrating multiple hypoattenuating renal cysts bilaterally. The inferior pole within the right kidney was targeted for random renal biopsy given lack of adjacent cysts as well as adequate percutaneous window. The procedure was planned. The operative site was prepped and draped in the usual sterile fashion. Appropriate trajectory was confirmed with a 22 gauge spinal needle after the adjacent tissues were anesthetized with 1% Lidocaine with epinephrine. Under intermittent CT guidance, a 17 gauge coaxial needle was advanced into the peripheral aspect of inferolateral aspect of the right kidney. Appropriate positioning was confirmed and 3 core needle biopsy samples were obtained with an 18 gauge core needle biopsy device. The co-axial needle was removed following administration of a Gel-Foam slurry and superficial hemostasis was achieved with manual compression. A limited postprocedural CT was negative for hemorrhage or additional complication. A dressing was placed. The patient tolerated the procedure well without immediate postprocedural complication. IMPRESSION: Technically successful CT guided core needle biopsy of inferior pole of the right kidney. Electronically Signed   By: Sandi Mariscal M.D.   On: 04/30/2020 15:17    Labs: BMET Recent Labs  Lab 04/29/20 1424 04/30/20 0827 05/01/20 0138 05/02/20 0207  NA 137 139 137 134*  K 4.8 5.2* 5.7* 5.4*  CL 103 109 106 104  CO2 21* 19* 18* 19*  GLUCOSE 112* 106* 107* 188*  BUN 62* 54* 52* 54*  CREATININE 7.11* 7.14* 6.97* 7.11*  CALCIUM 9.1 8.7* 8.7* 8.7*  PHOS  --    --  4.7* 4.7*   CBC Recent Labs  Lab 04/29/20 1424 04/30/20 0827 05/01/20 0138 05/02/20 0207  WBC 11.0* 11.7* 13.8* 14.7*  NEUTROABS 8.1*  --   --   --   HGB 9.5* 8.2* 8.4* 8.2*  HCT 31.9* 27.4* 27.9* 26.6*  MCV 97.3 95.1 94.6 94.0  PLT 808* 695* 720* 662*    Medications:    . cyclophosphamide  100 mg Oral Daily  . feeding supplement  1 Container Oral BID BM  . folic acid  1 mg Oral Daily  . pantoprazole  40 mg Oral Daily  . raloxifene  60 mg Oral Daily  . sodium bicarbonate  650 mg Oral BID  . sodium zirconium cyclosilicate  10 g Oral Daily     [place] Constable, DO [facility] Resident, PGY 2 05/02/2020, 9:26 AM

## 2020-05-02 NOTE — Plan of Care (Signed)

## 2020-05-02 NOTE — Progress Notes (Signed)
PROGRESS NOTE    KAVITA BARTL  EQA:[id number] DOB: Oct 24, 1946 DOA: 04/29/2020 PCP: Sharilyn Sites, MD    Brief Narrative:  Cheryl [name] is a 74 y.o. female with medical history significant for Hashimoto's, diverticulosis.  Patient presented to the ED with complaints of persistent and gradually worsening difficulty breathing over the past month since she was hospitalized.  She reports persistent dry cough.  No fevers no chills.  No chest pain.  No lower extremity swelling. Patient saw her primary care provider, and was referred to the ED due to elevated creatinine. She denies nausea, vomiting or loose stools, she reports stable oral intake, she reports recent NSAID use.  She denies diuretic use.  No family history of lung disease that she is aware of.  She reports generalized abdominal queasiness over the past 1 to 2 weeks.  She denies urinary frequency or dysuria.  Recent hospitalization 4/8- 4/10, treated for multifocal pneumonia with acute hypoxic respiratory failure, treated with ceftriaxone and azithromycin, inpatient and discharged on a Omnicef and azithromycin.  She did not require supplemental oxygen on discharge.   ED Course: Stable vitals.  Creatinine elevated at 7.11.  Unremarkable BNP 44. Platelets elevated at 808.  Hemoglobin low at 9.5.  Likely large hemoglobin, protein, positive leukocytes, greater than 50 WBCs, no bacteria.  2 view chest xray shows ill-defined opacities throughout the lungs, stable or slightly worsened since prior study.  1 L bolus normal saline given, IV ceftriaxone started. Hospitalist to admit for acute kidney injury.    Assessment & Plan:   Principal Problem:   ARF (acute renal failure) (HCC) Active Problems:   Diverticulosis of colon without hemorrhage   Hashimoto's thyroiditis   Acute renal failure 2/2 Pauci-immune necrotizing glomerulonephritis Creatinine markedly elevated at 7.11 with a BUN of 62 on ED presentation.  Urinalysis showing hemoglobin  and protein.  CT renal stone study with no urinary tract calculus or hydronephrosis with enlarging bilateral renal cyst.  Ultrasound renal with no hydronephrosis or parenchymal abnormality with multiple simple renal cyst appreciated.  Routine/creatinine ratio elevated 0.37. Complement C3/C4 within normal limits.  ANA positive.  SPEP essentially within normal limits.  GBM antibody negative.  ANCA MPO antibodies 29.6, positive.  Kappa light chain elevated to 40.7.  Lambda light chain elevated 181.8.   --Nephrology following, appreciate assistance --Cr 7.11>7.14>6.97>7.11 (1.4 one month prior) --CT renal biopsy 5/12, prelim path w/ pauci immune necrotizing GN  --started on solumedrol 1g IV daily --started on cytoxan 100mg  PO daily --sodium bicarbonate 650 mg p.o. twice daily --Avoid nephrotoxins, renally dose all medications --Strict I's and O's, specifically close monitoring of urinary output --Follow BMP daily  Hyperkalemia Potassium 5.4 today --Continue Lokelma 10 g p.o. daily --Continue close monitoring of electrolytes  Acute on chronic anemia  Hemoglobin 8.2, MCV 95.1, stable.  Iron 17, TIBC 239, ferritin 425, folate 5.3, B12 569, reticulocyte count count 1.4  --s/p Feraheme IV 5/12; nephrology recommends repeat dose on 5/15.   --Continue to monitor CBC, transfuse for hemoglobin less than 7.0  Pyuria Urinalysis with moderate leukoesterase, negative nitrite, no bacteria, greater than 50 WBCs.  Urine culture with no growth.  Initially was started on ceftriaxone, now discontinued.   DVT prophylaxis: SCDs Code Status: Full code Family Communication: No family present at bedside  Disposition Plan:  Status is: Inpatient  Remains inpatient appropriate because:Persistent severe electrolyte disturbances, Ongoing diagnostic testing needed not appropriate for outpatient work up, Unsafe d/c plan and Inpatient level of care appropriate due to  severity of illness per nephrology, if remains stable  following IV Solu-Medrol, may discharge home with outpatient follow-up.   Dispo: The patient is from: Home              Anticipated d/c is to: Home              Anticipated d/c date is: 3 days              Patient currently is not medically stable to d/c.   Consultants:   Nephrology  Procedures:   CT-guided renal biopsy 04/30/2020  Antimicrobials:   Ceftriaxone  5/11-5/11   Subjective: Patient seen and examined bedside, resting comfortably.  Nephrology and nursing present.  Preliminary results of her renal biopsy notable for pauci immune glomerulonephritis.  Started on Solu-Medrol and Cytoxan by nephrology. Patient reports continued adequate urine output. No other complaints or concerns at this time.  Denies headache, no fever/chills/night sweats, nausea/vomiting, no diarrhea, no chest pain, no palpitations, no shortness of breath.  No acute events overnight per nursing staff.  Objective: Vitals:   05/01/20 0447 05/01/20 1559 05/01/20 2049 05/02/20 0542  BP:  135/86 122/80 (!) 157/93  Pulse:  (!) 108 (!) 105 97  Resp:  18 18 17   Temp:  98.4 F (36.9 C) 98 F (36.7 C) 98.4 F (36.9 C)  TempSrc:  Oral Oral Oral  SpO2:  94% 96% 92%  Weight: 94.8 kg     Height:        Intake/Output Summary (Last 24 hours) at 05/02/2020 1138 Last data filed at 05/01/2020 2359 Gross per 24 hour  Intake 372.52 ml  Output 300 ml  Net 72.52 ml   Filed Weights   04/29/20 1313 04/29/20 2302 05/01/20 0447  Weight: 90.3 kg 92.6 kg 94.8 kg    Examination:  General exam: Appears calm and comfortable  Respiratory system: Clear to auscultation. Respiratory effort normal. Cardiovascular system: S1 & S2 heard, RRR. No JVD, murmurs, rubs, gallops or clicks. No pedal edema. Gastrointestinal system: Abdomen is nondistended, soft and nontender. No organomegaly or masses felt. Normal bowel sounds heard. Central nervous system: Alert and oriented. No focal neurological deficits. Extremities: Symmetric  5 x 5 power. Skin: No rashes, lesions or ulcers Psychiatry: Judgement and insight appear normal. Mood & affect appropriate.     Data Reviewed: I have personally reviewed following labs and imaging studies  CBC: Recent Labs  Lab 04/29/20 1424 04/30/20 0827 05/01/20 0138 05/02/20 0207  WBC 11.0* 11.7* 13.8* 14.7*  NEUTROABS 8.1*  --   --   --   HGB 9.5* 8.2* 8.4* 8.2*  HCT 31.9* 27.4* 27.9* 26.6*  MCV 97.3 95.1 94.6 94.0  PLT 808* 695* 720* 233*   Basic Metabolic Panel: Recent Labs  Lab 04/29/20 1424 04/30/20 0827 05/01/20 0138 05/02/20 0207  NA 137 139 137 134*  K 4.8 5.2* 5.7* 5.4*  CL 103 109 106 104  CO2 21* 19* 18* 19*  GLUCOSE 112* 106* 107* 188*  BUN 62* 54* 52* 54*  CREATININE 7.11* 7.14* 6.97* 7.11*  CALCIUM 9.1 8.7* 8.7* 8.7*  PHOS  --   --  4.7* 4.7*   GFR: Estimated Creatinine Clearance: 7.2 mL/min (A) (by C-G formula based on SCr of 7.11 mg/dL (H)). Liver Function Tests: Recent Labs  Lab 04/29/20 1424 05/01/20 0138 05/02/20 0207  AST 19  --   --   ALT 10  --   --   ALKPHOS 78  --   --  BILITOT 0.4  --   --   PROT 8.5*  --   --   ALBUMIN 3.3* 2.5* 2.5*   No results for input(s): LIPASE, AMYLASE in the last 168 hours. No results for input(s): AMMONIA in the last 168 hours. Coagulation Profile: Recent Labs  Lab 05-26-20 1106  INR 1.1   Cardiac Enzymes: No results for input(s): CKTOTAL, CKMB, CKMBINDEX, TROPONINI in the last 168 hours. BNP (last 3 results) No results for input(s): PROBNP in the last 8760 hours. HbA1C: No results for input(s): HGBA1C in the last 72 hours. CBG: No results for input(s): GLUCAP in the last 168 hours. Lipid Profile: No results for input(s): CHOL, HDL, LDLCALC, TRIG, CHOLHDL, LDLDIRECT in the last 72 hours. Thyroid Function Tests: No results for input(s): TSH, T4TOTAL, FREET4, T3FREE, THYROIDAB in the last 72 hours. Anemia Panel: Recent Labs    2020-05-26 1140  VITAMINB12 569  FOLATE 5.3*  FERRITIN 425*    TIBC 239*  IRON 17*  RETICCTPCT 1.4   Sepsis Labs: No results for input(s): PROCALCITON, LATICACIDVEN in the last 168 hours.  Recent Results (from the past 240 hour(s))  Urine culture     Status: None   Collection Time: 04/29/20  2:21 PM   Specimen: Urine, Random  Result Value Ref Range Status   Specimen Description   Final    URINE, RANDOM Performed at [facility] [address] [place], [place] [address]    Special Requests   Final    NONE Performed at [facility], [address], [place], [place] [address]    Culture   Final    NO GROWTH Performed at [facility], [place] [address], [place], [place] [address]    Report Status 05/26/20 FINAL  Final  SARS Coronavirus 2 by RT PCR (hospital order, performed in [facility] hospital lab) Nasopharyngeal Nasopharyngeal Swab     Status: None   Collection Time: 04/29/20  2:23 PM   Specimen: Nasopharyngeal Swab  Result Value Ref Range Status   SARS Coronavirus 2 NEGATIVE NEGATIVE Final    Comment: (NOTE) SARS-CoV-2 target nucleic acids are NOT DETECTED. The SARS-CoV-2 RNA is generally detectable in upper and lower respiratory specimens during the acute phase of infection. The lowest concentration of SARS-CoV-2 viral copies this assay can detect is 250 copies / mL. A negative result does not preclude SARS-CoV-2 infection and should not be used as the sole basis for treatment or other patient management decisions.  A negative result may occur with improper specimen collection / handling, submission of specimen other than nasopharyngeal swab, presence of viral mutation(s) within the areas targeted by this assay, and inadequate number of viral copies (<250 copies / mL). A negative result must be combined with clinical observations, patient history, and epidemiological information. Fact Sheet for Patients:   StrictlyIdeas.no Fact Sheet for Healthcare  Providers: BankingDealers.co.za This test is not yet approved or cleared  by the [place] FDA and has been authorized for detection and/or diagnosis of SARS-CoV-2 by FDA under an Emergency Use Authorization (EUA).  This EUA will remain in effect (meaning this test can be used) for the duration of the COVID-19 declaration under Section 564(b)(1) of the Act, 21 U.S.C. section 360bbb-3(b)(1), unless the authorization is terminated or revoked sooner. Performed at [facility], [address], [place], [place] [address]          Radiology Studies: CT BIOPSY  Result Date: 05/26/2020 INDICATION: Acute on chronic renal insufficiency. Please perform CT-guided biopsy  for tissue diagnostic purposes. EXAM: CT-GUIDED RANDOM RENAL BIOPSY COMPARISON:  Renal ultrasound-04/30/2020; CT abdomen pelvis-04/29/2020 MEDICATIONS: None. ANESTHESIA/SEDATION: Fentanyl 25 mcg IV; Versed 0.5 mg IV Sedation time: 14 minutes; The patient was continuously monitored during the procedure by the interventional radiology nurse under my direct supervision. CONTRAST:  None. COMPLICATIONS: None immediate. PROCEDURE: Given presence of known multiple bilateral renal cysts, the decision was made to pursue right renal biopsy with CT guidance. Informed consent was obtained from the patient following an explanation of the procedure, risks, benefits and alternatives. A time out was performed prior to the initiation of the procedure. The patient was positioned prone on the CT table and a limited CT was performed for procedural planning demonstrating multiple hypoattenuating renal cysts bilaterally. The inferior pole within the right kidney was targeted for random renal biopsy given lack of adjacent cysts as well as adequate percutaneous window. The procedure was planned. The operative site was prepped and draped in the usual sterile fashion. Appropriate trajectory was confirmed with a 22 gauge spinal needle after the  adjacent tissues were anesthetized with 1% Lidocaine with epinephrine. Under intermittent CT guidance, a 17 gauge coaxial needle was advanced into the peripheral aspect of inferolateral aspect of the right kidney. Appropriate positioning was confirmed and 3 core needle biopsy samples were obtained with an 18 gauge core needle biopsy device. The co-axial needle was removed following administration of a Gel-Foam slurry and superficial hemostasis was achieved with manual compression. A limited postprocedural CT was negative for hemorrhage or additional complication. A dressing was placed. The patient tolerated the procedure well without immediate postprocedural complication. IMPRESSION: Technically successful CT guided core needle biopsy of inferior pole of the right kidney. Electronically Signed   By: Sandi Mariscal M.D.   On: 04/30/2020 15:17        Scheduled Meds: . cyclophosphamide  100 mg Oral Daily  . darbepoetin (ARANESP) injection - NON-DIALYSIS  100 mcg Subcutaneous Q Fri-1800  . feeding supplement  1 Container Oral BID BM  . folic acid  1 mg Oral Daily  . pantoprazole  40 mg Oral Daily  . raloxifene  60 mg Oral Daily  . sodium bicarbonate  650 mg Oral BID  . sodium zirconium cyclosilicate  10 g Oral Daily   Continuous Infusions: . [START ON 05/04/2020] ferumoxytol    . methylPREDNISolone (SOLU-MEDROL) injection 1,000 mg (05/02/20 0947)     LOS: 3 days    Time spent: 36 minutes spent on chart review, discussion with nursing staff, consultants, updating family and interview/physical exam; more than 50% of that time was spent in counseling and/or coordination of care.    Illona Bulman J [place] DO [facility] Available via Epic secure chat 7am-7pm After these hours, please refer to coverage provider listed on [email or web address] 05/02/2020, 11:38 AM

## 2020-05-03 LAB — RENAL FUNCTION PANEL
Albumin: 2.4 g/dL — ABNORMAL LOW (ref 3.5–5.0)
Anion gap: 13 (ref 5–15)
BUN: 64 mg/dL — ABNORMAL HIGH (ref 8–23)
CO2: 21 mmol/L — ABNORMAL LOW (ref 22–32)
Calcium: 8.6 mg/dL — ABNORMAL LOW (ref 8.9–10.3)
Chloride: 102 mmol/L (ref 98–111)
Creatinine, Ser: 7.36 mg/dL — ABNORMAL HIGH (ref 0.44–1.00)
GFR calc Af Amer: 6 mL/min — ABNORMAL LOW (ref >60)
GFR calc non Af Amer: 5 mL/min — ABNORMAL LOW (ref >60)
Glucose, Bld: 140 mg/dL — ABNORMAL HIGH (ref 70–99)
Phosphorus: 5.1 mg/dL — ABNORMAL HIGH (ref 2.5–4.6)
Potassium: 4.9 mmol/L (ref 3.5–5.1)
Sodium: 136 mmol/L (ref 135–145)

## 2020-05-03 LAB — CBC
HCT: 24.4 % — ABNORMAL LOW (ref 36.0–46.0)
Hemoglobin: 7.6 g/dL — ABNORMAL LOW (ref 12.0–15.0)
MCH: 28.4 pg (ref 26.0–34.0)
MCHC: 31.1 g/dL (ref 30.0–36.0)
MCV: 91 fL (ref 80.0–100.0)
Platelets: 705 10*3/uL — ABNORMAL HIGH (ref 150–400)
RBC: 2.68 MIL/uL — ABNORMAL LOW (ref 3.87–5.11)
RDW: 16.5 % — ABNORMAL HIGH (ref 11.5–15.5)
WBC: 22.8 10*3/uL — ABNORMAL HIGH (ref 4.0–10.5)
nRBC: 0 % (ref 0.0–0.2)

## 2020-05-03 MED ORDER — BISACODYL 10 MG RE SUPP
10.0000 mg | Freq: Once | RECTAL | Status: DC
  Filled 2020-05-03: qty 1

## 2020-05-03 MED ORDER — LINACLOTIDE 145 MCG PO CAPS
290.0000 ug | ORAL_CAPSULE | Freq: Every day | ORAL | Status: DC
  Administered 2020-05-04 – 2020-05-07 (×4): 290 ug via ORAL
  Filled 2020-05-03 (×5): qty 2

## 2020-05-03 MED ORDER — SENNOSIDES-DOCUSATE SODIUM 8.6-50 MG PO TABS
2.0000 | ORAL_TABLET | Freq: Two times a day (BID) | ORAL | Status: DC
  Administered 2020-05-03 – 2020-05-06 (×8): 2 via ORAL
  Administered 2020-05-07: 1 via ORAL
  Filled 2020-05-03 (×9): qty 2

## 2020-05-03 NOTE — Progress Notes (Signed)
[facility] Progress Note    Assessment/ Plan:   Cheryl [name] is a 74 y.o. female with PMH GERD, arthritis, porior sleep apnea, and constipation, who presented with worsening of outpatient labs with worsened renal failure, Cr had increased from 1.4 to 7.11 in 1 month.  1. MPO ANCA causing posse immune necrotizing glomerulonephritis with associated AKI - Biopsy showed 65% crescents with minimal scarring and also ATN.  Started on solumedrol 1000mg  QD and oral cytoxan late on 5/13.  Positive ANA, GBM AB neg. creatinine relatively stable with improving urine output.  No indication for dialysis at this time and hopefully increasing urine output is signs of renal recovery.  We will continue to monitor creatinine daily as well as urine output.  Continue oral Cytoxan and steroids. 2. Hyperkalemia - K 4.9.  On Lokelma.  Consider holding if potassium continues to improve 3. Proteinuria -secondary to glomerulonephritis. 4. Hematuria -sec [place] to glomerulonephritis 5. Dyspnea -improved with treatments as above 6. HTN -blood pressure improved at this time.  Continue to monitor 7. Normocytic Anemia -likely anemia associated with inflammation.  No indication for transfusion at this time but continue to monitor 8. Leukocytosis: Likely steroid effect.  Continue to monitor 9. Renal Cysts - seen on CT renal stone study, Renal US with 3 simple renal cysts, 2 on right measuring 3.7 x 3.1 x 3.2 cm and 2.6 x 3.0 x 2.9 cm, with 1 on left measuring 3.3 x 2.9 x 3.6 cm. Not as dramatic on u/s-  Not indicative of a polycystic dz       Subjective:   Patient states she remained stable today.  She feels like her breathing is better.  She continues to note increased urine output.  No hematuria that is visible.  Otherwise no complaints.   Objective:   BP 134/85 (BP Location: Right Arm)   Pulse 92   Temp 97.7 F (36.5 C) (Oral)   Resp 17   Ht 4\' 11"  (1.499 m)   Wt 94.8 kg   SpO2 93%   BMI 42.21  kg/m   Intake/Output Summary (Last 24 hours) at 05/03/2020 1211 Last data filed at 05/03/2020 0650 Gross per 24 hour  Intake 220 ml  Output 700 ml  Net -480 ml   Weight change:   Physical Exam: General: 74 y.o. female in NAD Cardio: RRR no m/r/g Lungs: CTAB, no wheezing, no rhonchi, no crackles, no IWOB on RA Abdomen: Soft, non-tender to palpation, non-distended, positive bowel sounds Skin: warm and dry Extremities: No edema   Imaging: No results found.  Labs: BMET Recent Labs  Lab 04/29/20 1424 04/30/20 0827 05/01/20 0138 05/02/20 0207 05/03/20 0353  NA 137 139 137 134* 136  K 4.8 5.2* 5.7* 5.4* 4.9  CL 103 109 106 104 102  CO2 21* 19* 18* 19* 21*  GLUCOSE 112* 106* 107* 188* 140*  BUN 62* 54* 52* 54* 64*  CREATININE 7.11* 7.14* 6.97* 7.11* 7.36*  CALCIUM 9.1 8.7* 8.7* 8.7* 8.6*  PHOS  --   --  4.7* 4.7* 5.1*   CBC Recent Labs  Lab 04/29/20 1424 04/29/20 1424 04/30/20 0827 05/01/20 0138 05/02/20 0207 05/03/20 0353  WBC 11.0*   < > 11.7* 13.8* 14.7* 22.8*  NEUTROABS 8.1*  --   --   --   --   --   HGB 9.5*   < > 8.2* 8.4* 8.2* 7.6*  HCT 31.9*   < > 27.4* 27.9* 26.6* 24.4*  MCV 97.3   < >  95.1 94.6 94.0 91.0  PLT 808*   < > 695* 720* 662* 705*   < > = values in this interval not displayed.    Medications:    . bisacodyl  10 mg Rectal Once  . cyclophosphamide  100 mg Oral Daily  . darbepoetin (ARANESP) injection - NON-DIALYSIS  100 mcg Subcutaneous Q Fri-1800  . feeding supplement  1 Container Oral BID BM  . folic acid  1 mg Oral Daily  . [START ON 05/04/2020] linaclotide  290 mcg Oral QAC breakfast  . pantoprazole  40 mg Oral Daily  . raloxifene  60 mg Oral Daily  . senna-docusate  2 tablet Oral BID  . sodium bicarbonate  650 mg Oral BID  . sodium zirconium cyclosilicate  10 g Oral Daily      05/03/2020, 12:11 PM

## 2020-05-03 NOTE — Progress Notes (Signed)
PROGRESS NOTE    Cheryl [name]  UMP:[id number] DOB: Feb 19, 1946 DOA: 04/29/2020 PCP: Sharilyn Sites, MD    Brief Narrative:  Cheryl [name] is a 74 y.o. female with medical history significant for Hashimoto's, diverticulosis.  Patient presented to the ED with complaints of persistent and gradually worsening difficulty breathing over the past month since she was hospitalized.  She reports persistent dry cough.  No fevers no chills.  No chest pain.  No lower extremity swelling. Patient saw her primary care provider, and was referred to the ED due to elevated creatinine. She denies nausea, vomiting or loose stools, she reports stable oral intake, she reports recent NSAID use.  She denies diuretic use.  No family history of lung disease that she is aware of.  She reports generalized abdominal queasiness over the past 1 to 2 weeks.  She denies urinary frequency or dysuria.  Recent hospitalization 4/8- 4/10, treated for multifocal pneumonia with acute hypoxic respiratory failure, treated with ceftriaxone and azithromycin, inpatient and discharged on a Omnicef and azithromycin.  She did not require supplemental oxygen on discharge.   ED Course: Stable vitals.  Creatinine elevated at 7.11.  Unremarkable BNP 44. Platelets elevated at 808.  Hemoglobin low at 9.5.  Likely large hemoglobin, protein, positive leukocytes, greater than 50 WBCs, no bacteria.  2 view chest xray shows ill-defined opacities throughout the lungs, stable or slightly worsened since prior study.  1 L bolus normal saline given, IV ceftriaxone started. Hospitalist to admit for acute kidney injury.    Assessment & Plan:   Principal Problem:   ARF (acute renal failure) (HCC) Active Problems:   Diverticulosis of colon without hemorrhage   Hashimoto's thyroiditis   Acute renal failure 2/2 Pauci-immune necrotizing glomerulonephritis Creatinine markedly elevated at 7.11 with a BUN of 62 on ED presentation.  Urinalysis showing hemoglobin  and protein.  CT renal stone study with no urinary tract calculus or hydronephrosis with enlarging bilateral renal cyst.  Ultrasound renal with no hydronephrosis or parenchymal abnormality with multiple simple renal cyst appreciated.  Routine/creatinine ratio elevated 0.37. Complement C3/C4 within normal limits.  ANA positive.  SPEP essentially within normal limits.  GBM antibody negative.  ANCA MPO antibodies 29.6, positive.  Kappa light chain elevated to 40.7.  Lambda light chain elevated 181.8.   --Nephrology following, appreciate assistance --Cr 7.11>7.14>6.97>7.11>7.36 (1.4 one month prior) --CT renal biopsy 5/12, prelim path w/ pauci immune necrotizing GN  --Plan 3d course solumedrol 1g IV q24h --started on cytoxan 100mg  PO daily --sodium bicarbonate 650 mg p.o. twice daily --Avoid nephrotoxins, renally dose all medications --Strict I's and O's, specifically close monitoring of urinary output --Follow BMP daily  Hyperkalemia Potassium 4.9 today --Continue Lokelma 10 g p.o. daily --Continue close monitoring of electrolytes  Acute on chronic anemia  Hemoglobin 8.2, MCV 95.1, stable.  Iron 17, TIBC 239, ferritin 425, folate 5.3, B12 569, reticulocyte count count 1.4  --s/p Feraheme IV 5/12; repeating dose on 5/16.   --Continue to monitor CBC, transfuse for hemoglobin less than 7.0  Pyuria Urinalysis with moderate leukoesterase, negative nitrite, no bacteria, greater than 50 WBCs.  Urine culture with no growth.  Initially was started on ceftriaxone, now discontinued.   DVT prophylaxis: SCDs Code Status: Full code Family Communication: No family present at bedside  Disposition Plan:  Status is: Inpatient  Remains inpatient appropriate because:Persistent severe electrolyte disturbances, Ongoing diagnostic testing needed not appropriate for outpatient work up, Unsafe d/c plan and Inpatient level of care appropriate due to severity  of illness per nephrology, if remains stable following  IV Solu-Medrol, may discharge home with outpatient follow-up.   Dispo: The patient is from: Home              Anticipated d/c is to: Home              Anticipated d/c date is: 3 days              Patient currently is not medically stable to d/c.   Consultants:   Nephrology  Procedures:   CT-guided renal biopsy 04/30/2020  Antimicrobials:   Ceftriaxone  5/11-5/11   Subjective: Patient seen and examined bedside, resting comfortably. Continues on Solu-Medrol and Cytoxan by nephrology. Patient reports continued adequate urine output although no improvement in BUN/Cr as of yet. No other complaints or concerns at this time.  Denies headache, no fever/chills/night sweats, nausea/vomiting, no diarrhea, no chest pain, no palpitations, no shortness of breath.  No acute events overnight per nursing staff.  Objective: Vitals:   05/02/20 0542 05/02/20 1315 05/02/20 2012 05/03/20 0447  BP: (!) 157/93 135/82 (!) 159/79 134/85  Pulse: 97 97 99 92  Resp: 17  16 17   Temp: 98.4 F (36.9 C) 99 F (37.2 C) 97.6 F (36.4 C) 97.7 F (36.5 C)  TempSrc: Oral Oral Oral Oral  SpO2: 92% (!) 87% 93% 93%  Weight:      Height:        Intake/Output Summary (Last 24 hours) at 05/03/2020 1202 Last data filed at 05/03/2020 0650 Gross per 24 hour  Intake 220 ml  Output 700 ml  Net -480 ml   Filed Weights   04/29/20 1313 04/29/20 2302 05/01/20 0447  Weight: 90.3 kg 92.6 kg 94.8 kg    Examination:  General exam: Appears calm and comfortable  Respiratory system: Clear to auscultation. Respiratory effort normal. Cardiovascular system: S1 & S2 heard, RRR. No JVD, murmurs, rubs, gallops or clicks. No pedal edema. Gastrointestinal system: Abdomen is nondistended, soft and nontender. No organomegaly or masses felt. Normal bowel sounds heard. Central nervous system: Alert and oriented. No focal neurological deficits. Extremities: Symmetric 5 x 5 power. Skin: No rashes, lesions or ulcers Psychiatry:  Judgement and insight appear normal. Mood & affect appropriate.    Data Reviewed: I have personally reviewed following labs and imaging studies  CBC: Recent Labs  Lab 04/29/20 1424 04/30/20 0827 05/01/20 0138 05/02/20 0207 05/03/20 0353  WBC 11.0* 11.7* 13.8* 14.7* 22.8*  NEUTROABS 8.1*  --   --   --   --   HGB 9.5* 8.2* 8.4* 8.2* 7.6*  HCT 31.9* 27.4* 27.9* 26.6* 24.4*  MCV 97.3 95.1 94.6 94.0 91.0  PLT 808* 695* 720* 662* 374*   Basic Metabolic Panel: Recent Labs  Lab 04/29/20 1424 04/30/20 0827 05/01/20 0138 05/02/20 0207 05/03/20 0353  NA 137 139 137 134* 136  K 4.8 5.2* 5.7* 5.4* 4.9  CL 103 109 106 104 102  CO2 21* 19* 18* 19* 21*  GLUCOSE 112* 106* 107* 188* 140*  BUN 62* 54* 52* 54* 64*  CREATININE 7.11* 7.14* 6.97* 7.11* 7.36*  CALCIUM 9.1 8.7* 8.7* 8.7* 8.6*  PHOS  --   --  4.7* 4.7* 5.1*   GFR: Estimated Creatinine Clearance: 7 mL/min (A) (by C-G formula based on SCr of 7.36 mg/dL (H)). Liver Function Tests: Recent Labs  Lab 04/29/20 1424 05/01/20 0138 05/02/20 0207 05/03/20 0353  AST 19  --   --   --   ALT 10  --   --   --  ALKPHOS 78  --   --   --   BILITOT 0.4  --   --   --   PROT 8.5*  --   --   --   ALBUMIN 3.3* 2.5* 2.5* 2.4*   No results for input(s): LIPASE, AMYLASE in the last 168 hours. No results for input(s): AMMONIA in the last 168 hours. Coagulation Profile: Recent Labs  Lab 04/30/20 1106  INR 1.1   Cardiac Enzymes: No results for input(s): CKTOTAL, CKMB, CKMBINDEX, TROPONINI in the last 168 hours. BNP (last 3 results) No results for input(s): PROBNP in the last 8760 hours. HbA1C: No results for input(s): HGBA1C in the last 72 hours. CBG: No results for input(s): GLUCAP in the last 168 hours. Lipid Profile: No results for input(s): CHOL, HDL, LDLCALC, TRIG, CHOLHDL, LDLDIRECT in the last 72 hours. Thyroid Function Tests: No results for input(s): TSH, T4TOTAL, FREET4, T3FREE, THYROIDAB in the last 72 hours. Anemia  Panel: No results for input(s): VITAMINB12, FOLATE, FERRITIN, TIBC, IRON, RETICCTPCT in the last 72 hours. Sepsis Labs: No results for input(s): PROCALCITON, LATICACIDVEN in the last 168 hours.  Recent Results (from the past 240 hour(s))  Urine culture     Status: None   Collection Time: 04/29/20  2:21 PM   Specimen: Urine, Random  Result Value Ref Range Status   Specimen Description   Final    URINE, RANDOM Performed at [facility], [address] [place], [place] [address]    Special Requests   Final    NONE Performed at [facility], [address] [place], [place] [address]    Culture   Final    NO GROWTH Performed at [facility], [place] [address] [place], [place] [address]    Report Status 04/30/2020 FINAL  Final  SARS Coronavirus 2 by RT PCR (hospital order, performed in [facility] hospital lab) Nasopharyngeal Nasopharyngeal Swab     Status: None   Collection Time: 04/29/20  2:23 PM   Specimen: Nasopharyngeal Swab  Result Value Ref Range Status   SARS Coronavirus 2 NEGATIVE NEGATIVE Final    Comment: (NOTE) SARS-CoV-2 target nucleic acids are NOT DETECTED. The SARS-CoV-2 RNA is generally detectable in upper and lower respiratory specimens during the acute phase of infection. The lowest concentration of SARS-CoV-2 viral copies this assay can detect is 250 copies / mL. A negative result does not preclude SARS-CoV-2 infection and should not be used as the sole basis for treatment or other patient management decisions.  A negative result may occur with improper specimen collection / handling, submission of specimen other than nasopharyngeal swab, presence of viral mutation(s) within the areas targeted by this assay, and inadequate number of viral copies (<250 copies / mL). A negative result must be combined with clinical observations, patient history, and epidemiological information. Fact Sheet for Patients:   StrictlyIdeas.no Fact Sheet  for Healthcare Providers: BankingDealers.co.za This test is not yet approved or cleared  by the [place] FDA and has been authorized for detection and/or diagnosis of SARS-CoV-2 by FDA under an Emergency Use Authorization (EUA).  This EUA will remain in effect (meaning this test can be used) for the duration of the COVID-19 declaration under Section 564(b)(1) of the Act, 21 U.S.C. section 360bbb-3(b)(1), unless the authorization is terminated or revoked sooner. Performed at [facility], [address], [place], [place] [address]          Radiology Studies: No results found.      Scheduled Meds:  bisacodyl  10 mg Rectal Once   cyclophosphamide  100 mg Oral Daily   darbepoetin (ARANESP) injection - NON-DIALYSIS  100 mcg Subcutaneous Q Fri-1800   feeding supplement  1 Container Oral BID BM   folic acid  1 mg Oral Daily   [START ON 05/04/2020] linaclotide  290 mcg Oral QAC breakfast   pantoprazole  40 mg Oral Daily   raloxifene  60 mg Oral Daily   senna-docusate  2 tablet Oral BID   sodium bicarbonate  650 mg Oral BID   sodium zirconium cyclosilicate  10 g Oral Daily   Continuous Infusions:  [START ON 05/04/2020] ferumoxytol       LOS: 4 days    Time spent: 35 minutes spent on chart review, discussion with nursing staff, consultants, updating family and interview/physical exam; more than 50% of that time was spent in counseling and/or coordination of care.    Chevy Virgo J [place] DO [facility] Available via Epic secure chat 7am-7pm After these hours, please refer to coverage provider listed on [email or web address] 05/03/2020, 12:02 PM

## 2020-05-04 DIAGNOSIS — N059 Unspecified nephritic syndrome with unspecified morphologic changes: Secondary | ICD-10-CM | POA: Diagnosis present

## 2020-05-04 LAB — RENAL FUNCTION PANEL
Albumin: 2.4 g/dL — ABNORMAL LOW (ref 3.5–5.0)
Anion gap: 15 (ref 5–15)
BUN: 77 mg/dL — ABNORMAL HIGH (ref 8–23)
CO2: 21 mmol/L — ABNORMAL LOW (ref 22–32)
Calcium: 8.5 mg/dL — ABNORMAL LOW (ref 8.9–10.3)
Chloride: 103 mmol/L (ref 98–111)
Creatinine, Ser: 7.28 mg/dL — ABNORMAL HIGH (ref 0.44–1.00)
GFR calc Af Amer: 6 mL/min — ABNORMAL LOW (ref >60)
GFR calc non Af Amer: 5 mL/min — ABNORMAL LOW (ref >60)
Glucose, Bld: 130 mg/dL — ABNORMAL HIGH (ref 70–99)
Phosphorus: 6.8 mg/dL — ABNORMAL HIGH (ref 2.5–4.6)
Potassium: 4.2 mmol/L (ref 3.5–5.1)
Sodium: 139 mmol/L (ref 135–145)

## 2020-05-04 LAB — CBC
HCT: 23 % — ABNORMAL LOW (ref 36.0–46.0)
Hemoglobin: 7.3 g/dL — ABNORMAL LOW (ref 12.0–15.0)
MCH: 29 pg (ref 26.0–34.0)
MCHC: 31.7 g/dL (ref 30.0–36.0)
MCV: 91.3 fL (ref 80.0–100.0)
Platelets: 722 10*3/uL — ABNORMAL HIGH (ref 150–400)
RBC: 2.52 MIL/uL — ABNORMAL LOW (ref 3.87–5.11)
RDW: 16.7 % — ABNORMAL HIGH (ref 11.5–15.5)
WBC: 18.2 10*3/uL — ABNORMAL HIGH (ref 4.0–10.5)
nRBC: 0 % (ref 0.0–0.2)

## 2020-05-04 MED ORDER — SEVELAMER CARBONATE 800 MG PO TABS
800.0000 mg | ORAL_TABLET | Freq: Three times a day (TID) | ORAL | Status: DC
  Administered 2020-05-04 – 2020-05-07 (×9): 800 mg via ORAL
  Filled 2020-05-04 (×9): qty 1

## 2020-05-04 MED ORDER — PREDNISONE 50 MG PO TABS
60.0000 mg | ORAL_TABLET | Freq: Every day | ORAL | Status: DC
  Administered 2020-05-04 – 2020-05-07 (×4): 60 mg via ORAL
  Filled 2020-05-04 (×4): qty 1

## 2020-05-04 NOTE — Progress Notes (Signed)
PROGRESS NOTE    Cheryl [name]  CLE:[id number] DOB: May 06, 1946 DOA: 04/29/2020 PCP: Sharilyn Sites, MD    Brief Narrative:  Cheryl [name] is a 74 y.o. female with medical history significant for Hashimoto's, diverticulosis.  Patient presented to the ED with complaints of persistent and gradually worsening difficulty breathing over the past month since she was hospitalized.  She reports persistent dry cough.  No fevers no chills.  No chest pain.  No lower extremity swelling. Patient saw her primary care provider, and was referred to the ED due to elevated creatinine. She denies nausea, vomiting or loose stools, she reports stable oral intake, she reports recent NSAID use.  She denies diuretic use.  No family history of lung disease that she is aware of.  She reports generalized abdominal queasiness over the past 1 to 2 weeks.  She denies urinary frequency or dysuria.  Recent hospitalization 4/8- 4/10, treated for multifocal pneumonia with acute hypoxic respiratory failure, treated with ceftriaxone and azithromycin, inpatient and discharged on a Omnicef and azithromycin.  She did not require supplemental oxygen on discharge.   ED Course: Stable vitals.  Creatinine elevated at 7.11.  Unremarkable BNP 44. Platelets elevated at 808.  Hemoglobin low at 9.5.  Likely large hemoglobin, protein, positive leukocytes, greater than 50 WBCs, no bacteria.  2 view chest xray shows ill-defined opacities throughout the lungs, stable or slightly worsened since prior study.  1 L bolus normal saline given, IV ceftriaxone started. Hospitalist to admit for acute kidney injury.    Assessment & Plan:   Principal Problem:   ARF (acute renal failure) (HCC) Active Problems:   Diverticulosis of colon without hemorrhage   Hashimoto's thyroiditis   Acute renal failure 2/2 ANCA MPO Pauci-immune necrotizing glomerulonephritis Creatinine markedly elevated at 7.11 with a BUN of 62 on ED presentation.  Urinalysis showing  hemoglobin and protein.  CT renal stone study with no urinary tract calculus or hydronephrosis with enlarging bilateral renal cyst.  Ultrasound renal with no hydronephrosis or parenchymal abnormality with multiple simple renal cyst appreciated.  Routine/creatinine ratio elevated 0.37. Complement C3/C4 within normal limits.  ANA positive.  SPEP essentially within normal limits.  GBM antibody negative.  ANCA MPO antibodies 29.6, positive.  Kappa light chain elevated to 40.7.  Lambda light chain elevated 181.8.   --Nephrology following, appreciate assistance --Cr 7.11>7.14>6.97>7.11>7.36>7.28 (1.4 one month prior) --CT renal biopsy 5/12, prelim path w/ pauci immune necrotizing GN  --Completed 3d course solumedrol 1g, now on prednisone 60mg  PO daily, taper per nephrology --started on cytoxan 100mg  PO daily --Avoid nephrotoxins, renally dose all medications --Strict I's and O's, specifically close monitoring of urinary output --Follow BMP daily  Hyperkalemia Potassium 429 today --Lokelma discontinued by nephrology today --Continue close monitoring of electrolytes  Acute on chronic anemia  Hemoglobin 8.2, MCV 95.1, stable.  Iron 17, TIBC 239, ferritin 425, folate 5.3, B12 569, reticulocyte count count 1.4  --s/p Feraheme IV 5/12; repeating today.   --Continue to monitor CBC, transfuse for hemoglobin less than 7.0  Pyuria Urinalysis with moderate leukoesterase, negative nitrite, no bacteria, greater than 50 WBCs.  Urine culture with no growth.  Initially was started on ceftriaxone, now discontinued.   DVT prophylaxis: SCDs Code Status: Full code Family Communication: No family present at bedside  Disposition Plan:  Status is: Inpatient  Remains inpatient appropriate because:Persistent severe electrolyte disturbances, Ongoing diagnostic testing needed not appropriate for outpatient work up, Unsafe d/c plan and Inpatient level of care appropriate due to severity of  illness per nephrology, if  remains stable following IV Solu-Medrol, may discharge home with outpatient follow-up.   Dispo: The patient is from: Home              Anticipated d/c is to: Home              Anticipated d/c date is: 3 days              Patient currently is not medically stable to d/c.   Consultants:   Nephrology  Procedures:   CT-guided renal biopsy 04/30/2020  Antimicrobials:   Ceftriaxone  5/11 - 5/11   Subjective: Patient seen and examined bedside, resting comfortably in bedside chair.  Reports bowel movement this morning.  Continues to report good urine output no other complaints or concerns at this time.  Denies headache, no fever/chills/night sweats, nausea/vomiting, no diarrhea, no chest pain, no palpitations, no shortness of breath.  No acute events overnight per nursing staff.  Objective: Vitals:   05/03/20 0447 05/03/20 2135 05/04/20 0434 05/04/20 0454  BP: 134/85 137/82 (!) 143/84   Pulse: 92 81 92   Resp: 17 18 16    Temp: 97.7 F (36.5 C) 97.9 F (36.6 C) 97.8 F (36.6 C)   TempSrc: Oral Oral Oral   SpO2: 93% 93% 93% 92%  Weight:      Height:        Intake/Output Summary (Last 24 hours) at 05/04/2020 1236 Last data filed at 05/04/2020 0900 Gross per 24 hour  Intake 300 ml  Output 900 ml  Net -600 ml   Filed Weights   04/29/20 1313 04/29/20 2302 05/01/20 0447  Weight: 90.3 kg 92.6 kg 94.8 kg    Examination:  General exam: Appears calm and comfortable  Respiratory system: Clear to auscultation. Respiratory effort normal. Cardiovascular system: S1 & S2 heard, RRR. No JVD, murmurs, rubs, gallops or clicks. No pedal edema. Gastrointestinal system: Abdomen is nondistended, soft and nontender. No organomegaly or masses felt. Normal bowel sounds heard. Central nervous system: Alert and oriented. No focal neurological deficits. Extremities: Symmetric 5 x 5 power. Skin: No rashes, lesions or ulcers Psychiatry: Judgement and insight appear normal. Mood & affect  appropriate.    Data Reviewed: I have personally reviewed following labs and imaging studies  CBC: Recent Labs  Lab 04/29/20 1424 04/29/20 1424 04/30/20 0827 05/01/20 0138 05/02/20 0207 05/03/20 0353 05/04/20 0345  WBC 11.0*   < > 11.7* 13.8* 14.7* 22.8* 18.2*  NEUTROABS 8.1*  --   --   --   --   --   --   HGB 9.5*   < > 8.2* 8.4* 8.2* 7.6* 7.3*  HCT 31.9*   < > 27.4* 27.9* 26.6* 24.4* 23.0*  MCV 97.3   < > 95.1 94.6 94.0 91.0 91.3  PLT 808*   < > 695* 720* 662* 705* 722*   < > = values in this interval not displayed.   Basic Metabolic Panel: Recent Labs  Lab 04/30/20 0827 05/01/20 0138 05/02/20 0207 05/03/20 0353 05/04/20 0345  NA 139 137 134* 136 139  K 5.2* 5.7* 5.4* 4.9 4.2  CL 109 106 104 102 103  CO2 19* 18* 19* 21* 21*  GLUCOSE 106* 107* 188* 140* 130*  BUN 54* 52* 54* 64* 77*  CREATININE 7.14* 6.97* 7.11* 7.36* 7.28*  CALCIUM 8.7* 8.7* 8.7* 8.6* 8.5*  PHOS  --  4.7* 4.7* 5.1* 6.8*   GFR: Estimated Creatinine Clearance: 7 mL/min (A) (by C-G formula based on  SCr of 7.28 mg/dL (H)). Liver Function Tests: Recent Labs  Lab 04/29/20 1424 05/01/20 0138 05/02/20 0207 05/03/20 0353 05/04/20 0345  AST 19  --   --   --   --   ALT 10  --   --   --   --   ALKPHOS 78  --   --   --   --   BILITOT 0.4  --   --   --   --   PROT 8.5*  --   --   --   --   ALBUMIN 3.3* 2.5* 2.5* 2.4* 2.4*   No results for input(s): LIPASE, AMYLASE in the last 168 hours. No results for input(s): AMMONIA in the last 168 hours. Coagulation Profile: Recent Labs  Lab 04/30/20 1106  INR 1.1   Cardiac Enzymes: No results for input(s): CKTOTAL, CKMB, CKMBINDEX, TROPONINI in the last 168 hours. BNP (last 3 results) No results for input(s): PROBNP in the last 8760 hours. HbA1C: No results for input(s): HGBA1C in the last 72 hours. CBG: No results for input(s): GLUCAP in the last 168 hours. Lipid Profile: No results for input(s): CHOL, HDL, LDLCALC, TRIG, CHOLHDL, LDLDIRECT in the  last 72 hours. Thyroid Function Tests: No results for input(s): TSH, T4TOTAL, FREET4, T3FREE, THYROIDAB in the last 72 hours. Anemia Panel: No results for input(s): VITAMINB12, FOLATE, FERRITIN, TIBC, IRON, RETICCTPCT in the last 72 hours. Sepsis Labs: No results for input(s): PROCALCITON, LATICACIDVEN in the last 168 hours.  Recent Results (from the past 240 hour(s))  Urine culture     Status: None   Collection Time: 04/29/20  2:21 PM   Specimen: Urine, Random  Result Value Ref Range Status   Specimen Description   Final    URINE, RANDOM Performed at [facility], [address], [place], [place] [address]    Special Requests   Final    NONE Performed at [facility], [address], [place], [place] [address]    Culture   Final    NO GROWTH Performed at [facility], [place] [address], [place], [place] [address]    Report Status 04/30/2020 FINAL  Final  SARS Coronavirus 2 by RT PCR (hospital order, performed in [facility] hospital lab) Nasopharyngeal Nasopharyngeal Swab     Status: None   Collection Time: 04/29/20  2:23 PM   Specimen: Nasopharyngeal Swab  Result Value Ref Range Status   SARS Coronavirus 2 NEGATIVE NEGATIVE Final    Comment: (NOTE) SARS-CoV-2 target nucleic acids are NOT DETECTED. The SARS-CoV-2 RNA is generally detectable in upper and lower respiratory specimens during the acute phase of infection. The lowest concentration of SARS-CoV-2 viral copies this assay can detect is 250 copies / mL. A negative result does not preclude SARS-CoV-2 infection and should not be used as the sole basis for treatment or other patient management decisions.  A negative result may occur with improper specimen collection / handling, submission of specimen other than nasopharyngeal swab, presence of viral mutation(s) within the areas targeted by this assay, and inadequate number of viral copies (<250 copies / mL). A negative result must be combined with  clinical observations, patient history, and epidemiological information. Fact Sheet for Patients:   StrictlyIdeas.no Fact Sheet for Healthcare Providers: BankingDealers.co.za This test is not yet approved or cleared  by the [place] FDA and has been authorized for detection and/or diagnosis of SARS-CoV-2 by FDA under an Emergency Use Authorization (EUA).  This EUA will remain in effect (  meaning this test can be used) for the duration of the COVID-19 declaration under Section 564(b)(1) of the Act, 21 U.S.C. section 360bbb-3(b)(1), unless the authorization is terminated or revoked sooner. Performed at [facility], [address], [place], [place] [address]          Radiology Studies: No results found.      Scheduled Meds: . bisacodyl  10 mg Rectal Once  . cyclophosphamide  100 mg Oral Daily  . darbepoetin (ARANESP) injection - NON-DIALYSIS  100 mcg Subcutaneous Q Fri-1800  . feeding supplement  1 Container Oral BID BM  . folic acid  1 mg Oral Daily  . linaclotide  290 mcg Oral QAC breakfast  . pantoprazole  40 mg Oral Daily  . predniSONE  60 mg Oral Q breakfast  . raloxifene  60 mg Oral Daily  . senna-docusate  2 tablet Oral BID  . sevelamer carbonate  800 mg Oral TID WC   Continuous Infusions:    LOS: 5 days    Time spent: 34 minutes spent on chart review, discussion with nursing staff, consultants, updating family and interview/physical exam; more than 50% of that time was spent in counseling and/or coordination of care.    Tecora Eustache J [place] DO [facility] Available via Epic secure chat 7am-7pm After these hours, please refer to coverage provider listed on [email or web address] 05/04/2020, 12:36 PM

## 2020-05-04 NOTE — Progress Notes (Signed)
[facility] Progress Note    Assessment/ Plan:   Cheryl [name] is a 74 y.o. female with PMH GERD, arthritis, porior sleep apnea, and constipation, who presented with worsening of outpatient labs with worsened renal failure, Cr had increased from 1.4 to 7.11 in 1 month.  1. 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  Started on solumedrol 1000mg  QD and oral cytoxan late on 5/13.  Positive ANA, GBM AB neg.  a. Creatinine finally minimally improved today.  We will continue to monitor.  No indication for dialysis.   b. Continue oral Cytoxan as ordered  c. Status post IV methylprednisolone  d. Start oral prednisone 60 mg daily with taper to follow  e. Continue monitor urine output 2. Hyperkalemia: Associated with kidney disease now significantly improved.  Can stop Lokelma 3. Non-anion gap metabolic acidosis: Has improved with kidney function stabilization.  Can stop oral bicarbonate. 4. Hyperphosphatemia: Start sevelamer 800 mg 3 times daily 5. Proteinuria -secondary to glomerulonephritis. 6. Hematuria -secondary to glomerulonephritis 7. Dyspnea -improved with treatments as above 8. HTN -blood pressure improved at this time.  Continue to monitor 9. Normocytic Anemia -likely anemia associated with inflammation.  Receiving Feraheme today.  On Aranesp 100 mcg every 2 week.  Continue to monitor 10. Leukocytosis: Likely steroid effect.  Continue to monitor 11. Renal Cysts - seen on CT renal stone study, Renal US with 3 simple renal cysts, 2 on right measuring 3.7 x 3.1 x 3.2 cm and 2.6 x 3.0 x 2.9 cm, with 1 on left measuring 3.3 x 2.9 x 3.6 cm. Not as dramatic on u/s-  Not indicative of a polycystic dz       Subjective:   Patient feels well without complaint today.  Feels like her urine is clearing up.   Objective:   BP (!) 143/84 (BP Location: Right Arm)   Pulse 92    Temp 97.8 F (36.6 C) (Oral)   Resp 16   Ht 4\' 11"  (1.499 m)   Wt 94.8 kg   SpO2 92%   BMI 42.21 kg/m   Intake/Output Summary (Last 24 hours) at 05/04/2020 1147 Last data filed at 05/04/2020 0900 Gross per 24 hour  Intake 300 ml  Output 900 ml  Net -600 ml   Weight change:   Physical Exam: General: 74 y.o. female in NAD Cardio: Normal rate, no murmurs Lungs: Bilateral chest rise, no increased work of breathing Abdomen: Soft, nondistended Skin: warm and dry Extremities: No edema   Imaging: No results found.  Labs: BMET Recent Labs  Lab 04/29/20 1424 04/30/20 0827 05/01/20 0138 05/02/20 0207 05/03/20 0353 05/04/20 0345  NA 137 139 137 134* 136 139  K 4.8 5.2* 5.7* 5.4* 4.9 4.2  CL 103 109 106 104 102 103  CO2 21* 19* 18* 19* 21* 21*  GLUCOSE 112* 106* 107* 188* 140* 130*  BUN 62* 54* 52* 54* 64* 77*  CREATININE 7.11* 7.14* 6.97* 7.11* 7.36* 7.28*  CALCIUM 9.1 8.7* 8.7* 8.7* 8.6* 8.5*  PHOS  --   --  4.7* 4.7* 5.1* 6.8*   CBC Recent Labs  Lab 04/29/20 1424 04/30/20 0827 05/01/20 0138 05/02/20 0207 05/03/20 0353 05/04/20 0345  WBC 11.0*   < > 13.8* 14.7* 22.8* 18.2*  NEUTROABS 8.1*  --   --   --   --   --   HGB 9.5*   < > 8.4* 8.2* 7.6*  7.3*  HCT 31.9*   < > 27.9* 26.6* 24.4* 23.0*  MCV 97.3   < > 94.6 94.0 91.0 91.3  PLT 808*   < > 720* 662* 705* 722*   < > = values in this interval not displayed.    Medications:    . bisacodyl  10 mg Rectal Once  . cyclophosphamide  100 mg Oral Daily  . darbepoetin (ARANESP) injection - NON-DIALYSIS  100 mcg Subcutaneous Q Fri-1800  . feeding supplement  1 Container Oral BID BM  . folic acid  1 mg Oral Daily  . linaclotide  290 mcg Oral QAC breakfast  . pantoprazole  40 mg Oral Daily  . predniSONE  60 mg Oral Q breakfast  . raloxifene  60 mg Oral Daily  . senna-docusate  2 tablet Oral BID  . sevelamer carbonate  800 mg Oral TID WC  . sodium bicarbonate  650 mg Oral BID  . sodium zirconium cyclosilicate  10  g Oral Daily      05/04/2020, 11:47 AM

## 2020-05-04 NOTE — Plan of Care (Signed)

## 2020-05-05 LAB — RENAL FUNCTION PANEL
Albumin: 2.5 g/dL — ABNORMAL LOW (ref 3.5–5.0)
Anion gap: 15 (ref 5–15)
BUN: 89 mg/dL — ABNORMAL HIGH (ref 8–23)
CO2: 21 mmol/L — ABNORMAL LOW (ref 22–32)
Calcium: 8.3 mg/dL — ABNORMAL LOW (ref 8.9–10.3)
Chloride: 104 mmol/L (ref 98–111)
Creatinine, Ser: 7.24 mg/dL — ABNORMAL HIGH (ref 0.44–1.00)
GFR calc Af Amer: 6 mL/min — ABNORMAL LOW (ref >60)
GFR calc non Af Amer: 5 mL/min — ABNORMAL LOW (ref >60)
Glucose, Bld: 105 mg/dL — ABNORMAL HIGH (ref 70–99)
Phosphorus: 6.7 mg/dL — ABNORMAL HIGH (ref 2.5–4.6)
Potassium: 3.6 mmol/L (ref 3.5–5.1)
Sodium: 140 mmol/L (ref 135–145)

## 2020-05-05 LAB — CBC
HCT: 24.7 % — ABNORMAL LOW (ref 36.0–46.0)
Hemoglobin: 7.8 g/dL — ABNORMAL LOW (ref 12.0–15.0)
MCH: 28.7 pg (ref 26.0–34.0)
MCHC: 31.6 g/dL (ref 30.0–36.0)
MCV: 90.8 fL (ref 80.0–100.0)
Platelets: 725 10*3/uL — ABNORMAL HIGH (ref 150–400)
RBC: 2.72 MIL/uL — ABNORMAL LOW (ref 3.87–5.11)
RDW: 16.9 % — ABNORMAL HIGH (ref 11.5–15.5)
WBC: 18.8 10*3/uL — ABNORMAL HIGH (ref 4.0–10.5)
nRBC: 0.5 % — ABNORMAL HIGH (ref 0.0–0.2)

## 2020-05-05 NOTE — Plan of Care (Signed)

## 2020-05-05 NOTE — Progress Notes (Signed)
[facility] Progress Note     Assessment/ Plan:   Cheryl [name] is a 74 y.o. female with PMH GERD, arthritis, prior sleep apnea, and constipation, who presented with worsening of outpatient labs with worsened renal failure, Cr had increased from 1.4 to 7.11 in 1 month.  1. 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  Started on solumedrol 1000mg  QD and oral cytoxan late on 5/13.  Positive ANA, GBM AB neg.  a. Creatinine hopefully starting to stabilize.  No indication for dialysis but azotemia worsening; may be from the steroids and catabolic state..   b. Continue oral Cytoxan as ordered -> being renally dosed at 1mg /kg c. Status post IV methylprednisolone  d. Started on  oral prednisone 60 mg daily with taper to follow  e. Continue monitor urine output 2. Hyperkalemia: Associated with kidney disease now significantly improved.   3. Non-anion gap metabolic acidosis: Has improved with kidney function stabilization. . 4. Hyperphosphatemia: Started sevelamer 800 mg 3 times daily 5. Proteinuria -secondary to glomerulonephritis. 6. Hematuria -secondary to glomerulonephritis 7. Dyspnea -improved with treatments as above 8. HTN -blood pressure improved at this time.  Continue to monitor 9. Normocytic Anemia -likely anemia associated with inflammation.  Receiving Feraheme today.  On Aranesp 100 mcg every 2 week.  Continue to monitor 10. Leukocytosis: Likely steroid effect.  Continue to monitor 11. Renal Cysts - seen on CT renal stone study, Renal US with 3 simple renal cysts, 2 on right measuring 3.7 x 3.1 x 3.2 cm and 2.6 x 3.0 x 2.9 cm, with 1 on left measuring 3.3 x 2.9 x 3.6 cm. Not as dramatic on u/s-  Not indicative of a polycystic dz  Subjective:   Feels better; occasional dizziness but denies f/cn/v/dsypnea/ CP. GREAT UOP per pt.   Objective:   BP (!) 144/89 (BP  Location: Right Arm)   Pulse 87   Temp 97.9 F (36.6 C) (Oral)   Resp 18   Ht 4\' 11"  (1.499 m)   Wt 94.8 kg   SpO2 92%   BMI 42.21 kg/m   Intake/Output Summary (Last 24 hours) at 05/05/2020 1314 Last data filed at 05/05/2020 0800 Gross per 24 hour  Intake 100 ml  Output 1500 ml  Net -1400 ml   Weight change:   Physical Exam: General: 74 y.o. female in NAD Cardio: Normal rate, no murmurs Lungs: Bilateral chest rise, no increased work of breathing Abdomen: Soft, nondistended Skin: warm and dry Extremities: No edema  Imaging: No results found.  Labs: BMET Recent Labs  Lab 04/29/20 1424 04/30/20 0827 05/01/20 0138 05/02/20 0207 05/03/20 0353 05/04/20 0345 05/05/20 0453  NA 137 139 137 134* 136 139 140  K 4.8 5.2* 5.7* 5.4* 4.9 4.2 3.6  CL 103 109 106 104 102 103 104  CO2 21* 19* 18* 19* 21* 21* 21*  GLUCOSE 112* 106* 107* 188* 140* 130* 105*  BUN 62* 54* 52* 54* 64* 77* 89*  CREATININE 7.11* 7.14* 6.97* 7.11* 7.36* 7.28* 7.24*  CALCIUM 9.1 8.7* 8.7* 8.7* 8.6* 8.5* 8.3*  PHOS  --   --  4.7* 4.7* 5.1* 6.8* 6.7*   CBC Recent Labs  Lab 04/29/20 1424 04/30/20 0827 05/02/20 0207 05/03/20 0353 05/04/20 0345 05/05/20 0453  WBC 11.0*   < > 14.7* 22.8* 18.2* 18.8*  NEUTROABS 8.1*  --   --   --   --   --  HGB 9.5*   < > 8.2* 7.6* 7.3* 7.8*  HCT 31.9*   < > 26.6* 24.4* 23.0* 24.7*  MCV 97.3   < > 94.0 91.0 91.3 90.8  PLT 808*   < > 662* 705* 722* 725*   < > = values in this interval not displayed.    Medications:    . bisacodyl  10 mg Rectal Once  . cyclophosphamide  100 mg Oral Daily  . darbepoetin (ARANESP) injection - NON-DIALYSIS  100 mcg Subcutaneous Q Fri-1800  . feeding supplement  1 Container Oral BID BM  . folic acid  1 mg Oral Daily  . linaclotide  290 mcg Oral QAC breakfast  . pantoprazole  40 mg Oral Daily  . predniSONE  60 mg Oral Q breakfast  . raloxifene  60 mg Oral Daily  . senna-docusate  2 tablet Oral BID  . sevelamer carbonate  800  mg Oral TID WC      Otelia Santee, MD 05/05/2020, 1:14 PM

## 2020-05-05 NOTE — Progress Notes (Signed)
PROGRESS NOTE    NABIHA PLANCK  ALP:[id number] DOB: 1946-09-08 DOA: 04/29/2020 PCP: Sharilyn Sites, MD    Brief Narrative:  FARHANA FELLOWS is a 74 y.o. female with medical history significant for Hashimoto's, diverticulosis.  Patient presented to the ED with complaints of persistent and gradually worsening difficulty breathing over the past month since she was hospitalized.  She reports persistent dry cough.  No fevers no chills.  No chest pain.  No lower extremity swelling. Patient saw her primary care provider, and was referred to the ED due to elevated creatinine. She denies nausea, vomiting or loose stools, she reports stable oral intake, she reports recent NSAID use.  She denies diuretic use.  No family history of lung disease that she is aware of.  She reports generalized abdominal queasiness over the past 1 to 2 weeks.  She denies urinary frequency or dysuria.  Recent hospitalization 4/8- 4/10, treated for multifocal pneumonia with acute hypoxic respiratory failure, treated with ceftriaxone and azithromycin, inpatient and discharged on a Omnicef and azithromycin.  She did not require supplemental oxygen on discharge.   ED Course: Stable vitals.  Creatinine elevated at 7.11.  Unremarkable BNP 44. Platelets elevated at 808.  Hemoglobin low at 9.5.  Likely large hemoglobin, protein, positive leukocytes, greater than 50 WBCs, no bacteria.  2 view chest xray shows ill-defined opacities throughout the lungs, stable or slightly worsened since prior study.  1 L bolus normal saline given, IV ceftriaxone started. Hospitalist to admit for acute kidney injury.    Assessment & Plan:   Principal Problem:   Glomerulonephritis Active Problems:   Diverticulosis of colon without hemorrhage   Hashimoto's thyroiditis   ARF (acute renal failure) (HCC)   Acute renal failure 2/2 ANCA MPO Pauci-immune necrotizing glomerulonephritis Creatinine markedly elevated at 7.11 with a BUN of 62 on ED presentation.   Urinalysis showing hemoglobin and protein.  CT renal stone study with no urinary tract calculus or hydronephrosis with enlarging bilateral renal cyst.  Ultrasound renal with no hydronephrosis or parenchymal abnormality with multiple simple renal cyst appreciated.  Routine/creatinine ratio elevated 0.37. Complement C3/C4 within normal limits.  ANA positive.  SPEP essentially within normal limits.  GBM antibody negative.  ANCA MPO antibodies 29.6, positive.  Kappa light chain elevated to 40.7.  Lambda light chain elevated 181.8.   --Nephrology following, appreciate assistance --Cr 7.11>7.14>6.97>7.11>7.36>7.28>7.24 (1.4 one month prior) --CT renal biopsy 5/12, prelim path w/ pauci immune necrotizing GN  --Completed 3d course solumedrol 1g, now on prednisone 60mg  PO daily, taper per nephrology --Continue cytoxan 100mg  PO daily --Avoid nephrotoxins, renally dose all medications --Strict I's and O's, specifically close monitoring of urinary output --Follow BMP daily  Hyperkalemia Potassium 3.6 today --Lokelma discontinued by nephrology 5/16 --Continue close monitoring of electrolytes  Acute on chronic anemia  Hemoglobin 8.2, MCV 95.1, stable.  Iron 17, TIBC 239, ferritin 425, folate 5.3, B12 569, reticulocyte count count 1.4  --s/p Feraheme IV 5/12 and 5/16   --Continue to monitor CBC, transfuse for hemoglobin less than 7.0  Pyuria Urinalysis with moderate leukoesterase, negative nitrite, no bacteria, greater than 50 WBCs.  Urine culture with no growth.  Initially was started on ceftriaxone, now discontinued.   DVT prophylaxis: SCDs Code Status: Full code Family Communication: No family present at bedside  Disposition Plan:  Status is: Inpatient  Remains inpatient appropriate because:Persistent severe electrolyte disturbances, Ongoing diagnostic testing needed not appropriate for outpatient work up, Unsafe d/c plan and Inpatient level of care appropriate due to  severity of illness awaiting  improvement in renal function and nephrology signed off   Dispo: The patient is from: Home              Anticipated d/c is to: Home              Anticipated d/c date is: 3 days              Patient currently is not medically stable to d/c.   Consultants:   Nephrology  Procedures:   CT-guided renal biopsy 04/30/2020  Antimicrobials:   Ceftriaxone  5/11 - 5/11   Subjective: Patient seen and examined bedside, resting comfortably in bedside chair.   Continues to report good urine output no other complaints or concerns at this time.  Denies headache, no fever/chills/night sweats, nausea/vomiting, no diarrhea, no chest pain, no palpitations, no shortness of breath.  No acute events overnight per nursing staff.  Objective: Vitals:   05/04/20 0454 05/04/20 1500 05/04/20 2023 05/05/20 0438  BP:  (!) 153/82 140/72 (!) 144/89  Pulse:  84 67 87  Resp:  16 18 18   Temp:  97.6 F (36.4 C) 98.1 F (36.7 C) 97.9 F (36.6 C)  TempSrc:  Oral Oral Oral  SpO2: 92% 91% 95% 92%  Weight:      Height:        Intake/Output Summary (Last 24 hours) at 05/05/2020 1246 Last data filed at 05/05/2020 0800 Gross per 24 hour  Intake 250 ml  Output 1500 ml  Net -1250 ml   Filed Weights   04/29/20 1313 04/29/20 2302 05/01/20 0447  Weight: 90.3 kg 92.6 kg 94.8 kg    Examination:  General exam: Appears calm and comfortable  Respiratory system: Clear to auscultation. Respiratory effort normal. Cardiovascular system: S1 & S2 heard, RRR. No JVD, murmurs, rubs, gallops or clicks. No pedal edema. Gastrointestinal system: Abdomen is nondistended, soft and nontender. No organomegaly or masses felt. Normal bowel sounds heard. Central nervous system: Alert and oriented. No focal neurological deficits. Extremities: Symmetric 5 x 5 power. Skin: No rashes, lesions or ulcers Psychiatry: Judgement and insight appear normal. Mood & affect appropriate.    Data Reviewed: I have personally reviewed following  labs and imaging studies  CBC: Recent Labs  Lab 04/29/20 1424 04/30/20 0827 05/01/20 0138 05/02/20 0207 05/03/20 0353 05/04/20 0345 05/05/20 0453  WBC 11.0*   < > 13.8* 14.7* 22.8* 18.2* 18.8*  NEUTROABS 8.1*  --   --   --   --   --   --   HGB 9.5*   < > 8.4* 8.2* 7.6* 7.3* 7.8*  HCT 31.9*   < > 27.9* 26.6* 24.4* 23.0* 24.7*  MCV 97.3   < > 94.6 94.0 91.0 91.3 90.8  PLT 808*   < > 720* 662* 705* 722* 725*   < > = values in this interval not displayed.   Basic Metabolic Panel: Recent Labs  Lab 05/01/20 0138 05/02/20 0207 05/03/20 0353 05/04/20 0345 05/05/20 0453  NA 137 134* 136 139 140  K 5.7* 5.4* 4.9 4.2 3.6  CL 106 104 102 103 104  CO2 18* 19* 21* 21* 21*  GLUCOSE 107* 188* 140* 130* 105*  BUN 52* 54* 64* 77* 89*  CREATININE 6.97* 7.11* 7.36* 7.28* 7.24*  CALCIUM 8.7* 8.7* 8.6* 8.5* 8.3*  PHOS 4.7* 4.7* 5.1* 6.8* 6.7*   GFR: Estimated Creatinine Clearance: 7.1 mL/min (A) (by C-G formula based on SCr of 7.24 mg/dL (H)). Liver Function Tests: Recent  Labs  Lab 04/29/20 1424 04/29/20 1424 05/01/20 0138 05/02/20 0207 05/03/20 0353 05/04/20 0345 05/05/20 0453  AST 19  --   --   --   --   --   --   ALT 10  --   --   --   --   --   --   ALKPHOS 78  --   --   --   --   --   --   BILITOT 0.4  --   --   --   --   --   --   PROT 8.5*  --   --   --   --   --   --   ALBUMIN 3.3*   < > 2.5* 2.5* 2.4* 2.4* 2.5*   < > = values in this interval not displayed.   No results for input(s): LIPASE, AMYLASE in the last 168 hours. No results for input(s): AMMONIA in the last 168 hours. Coagulation Profile: Recent Labs  Lab 04/30/20 1106  INR 1.1   Cardiac Enzymes: No results for input(s): CKTOTAL, CKMB, CKMBINDEX, TROPONINI in the last 168 hours. BNP (last 3 results) No results for input(s): PROBNP in the last 8760 hours. HbA1C: No results for input(s): HGBA1C in the last 72 hours. CBG: No results for input(s): GLUCAP in the last 168 hours. Lipid Profile: No  results for input(s): CHOL, HDL, LDLCALC, TRIG, CHOLHDL, LDLDIRECT in the last 72 hours. Thyroid Function Tests: No results for input(s): TSH, T4TOTAL, FREET4, T3FREE, THYROIDAB in the last 72 hours. Anemia Panel: No results for input(s): VITAMINB12, FOLATE, FERRITIN, TIBC, IRON, RETICCTPCT in the last 72 hours. Sepsis Labs: No results for input(s): PROCALCITON, LATICACIDVEN in the last 168 hours.  Recent Results (from the past 240 hour(s))  Urine culture     Status: None   Collection Time: 04/29/20  2:21 PM   Specimen: Urine, Random  Result Value Ref Range Status   Specimen Description   Final    URINE, RANDOM Performed at [facility], [address], [place], [place] [address]    Special Requests   Final    NONE Performed at [facility], [address], [place], [place] [address]    Culture   Final    NO GROWTH Performed at [facility], [place] [address] [place], [place] [address]    Report Status 04/30/2020 FINAL  Final  SARS Coronavirus 2 by RT PCR (hospital order, performed in [facility] hospital lab) Nasopharyngeal Nasopharyngeal Swab     Status: None   Collection Time: 04/29/20  2:23 PM   Specimen: Nasopharyngeal Swab  Result Value Ref Range Status   SARS Coronavirus 2 NEGATIVE NEGATIVE Final    Comment: (NOTE) SARS-CoV-2 target nucleic acids are NOT DETECTED. The SARS-CoV-2 RNA is generally detectable in upper and lower respiratory specimens during the acute phase of infection. The lowest concentration of SARS-CoV-2 viral copies this assay can detect is 250 copies / mL. A negative result does not preclude SARS-CoV-2 infection and should not be used as the sole basis for treatment or other patient management decisions.  A negative result may occur with improper specimen collection / handling, submission of specimen other than nasopharyngeal swab, presence of viral mutation(s) within the areas targeted by this assay, and inadequate number of viral copies (<250  copies / mL). A negative result must be combined with clinical observations, patient history, and epidemiological information. Fact Sheet for Patients:   StrictlyIdeas.no Fact Sheet for Healthcare  Providers: BankingDealers.co.za This test is not yet approved or cleared  by the [place] and has been authorized for detection and/or diagnosis of SARS-CoV-2 by FDA under an Emergency Use Authorization (EUA).  This EUA will remain in effect (meaning this test can be used) for the duration of the COVID-19 declaration under Section 564(b)(1) of the Act, 21 U.S.C. section 360bbb-3(b)(1), unless the authorization is terminated or revoked sooner. Performed at [facility], [address], [place], [place] [address]          Radiology Studies: No results found.      Scheduled Meds: . bisacodyl  10 mg Rectal Once  . cyclophosphamide  100 mg Oral Daily  . darbepoetin (ARANESP) injection - NON-DIALYSIS  100 mcg Subcutaneous Q Fri-1800  . feeding supplement  1 Container Oral BID BM  . folic acid  1 mg Oral Daily  . linaclotide  290 mcg Oral QAC breakfast  . pantoprazole  40 mg Oral Daily  . predniSONE  60 mg Oral Q breakfast  . raloxifene  60 mg Oral Daily  . senna-docusate  2 tablet Oral BID  . sevelamer carbonate  800 mg Oral TID WC   Continuous Infusions:    LOS: 6 days    Time spent: 34 minutes spent on chart review, discussion with nursing staff, consultants, updating family and interview/physical exam; more than 50% of that time was spent in counseling and/or coordination of care.    Kalinda Romaniello J [place] DO [facility] Available via Epic secure chat 7am-7pm After these hours, please refer to coverage provider listed on [email or web address] 05/05/2020, 12:46 PM

## 2020-05-06 LAB — CBC
HCT: 24.3 % — ABNORMAL LOW (ref 36.0–46.0)
Hemoglobin: 7.8 g/dL — ABNORMAL LOW (ref 12.0–15.0)
MCH: 29.3 pg (ref 26.0–34.0)
MCHC: 32.1 g/dL (ref 30.0–36.0)
MCV: 91.4 fL (ref 80.0–100.0)
Platelets: 719 10*3/uL — ABNORMAL HIGH (ref 150–400)
RBC: 2.66 MIL/uL — ABNORMAL LOW (ref 3.87–5.11)
RDW: 16.9 % — ABNORMAL HIGH (ref 11.5–15.5)
WBC: 20.6 10*3/uL — ABNORMAL HIGH (ref 4.0–10.5)
nRBC: 1.4 % — ABNORMAL HIGH (ref 0.0–0.2)

## 2020-05-06 LAB — RENAL FUNCTION PANEL
Albumin: 2.3 g/dL — ABNORMAL LOW (ref 3.5–5.0)
Anion gap: 15 (ref 5–15)
BUN: 100 mg/dL — ABNORMAL HIGH (ref 8–23)
CO2: 20 mmol/L — ABNORMAL LOW (ref 22–32)
Calcium: 8 mg/dL — ABNORMAL LOW (ref 8.9–10.3)
Chloride: 105 mmol/L (ref 98–111)
Creatinine, Ser: 6.95 mg/dL — ABNORMAL HIGH (ref 0.44–1.00)
GFR calc Af Amer: 6 mL/min — ABNORMAL LOW (ref >60)
GFR calc non Af Amer: 5 mL/min — ABNORMAL LOW (ref >60)
Glucose, Bld: 101 mg/dL — ABNORMAL HIGH (ref 70–99)
Phosphorus: 5.6 mg/dL — ABNORMAL HIGH (ref 2.5–4.6)
Potassium: 3.3 mmol/L — ABNORMAL LOW (ref 3.5–5.1)
Sodium: 140 mmol/L (ref 135–145)

## 2020-05-06 NOTE — Progress Notes (Signed)
Nutrition Follow-up  DOCUMENTATION CODES:   Morbid obesity  INTERVENTION:   -D/c Boost Breeze po BID, each supplement provides 250 kcal and 9 grams of protein -Magic cup BID with meals, each supplement provides 290 kcal and 9 grams of protein  NUTRITION DIAGNOSIS:   Inadequate oral intake related to decreased appetite(altered taste perception) as evidenced by per patient/family report.  Ongoing  GOAL:   Patient will meet greater than or equal to 90% of their needs  Progressing   MONITOR:   PO intake, Supplement acceptance, Labs, Weight trends, Skin, I & O's  REASON FOR ASSESSMENT:   Malnutrition Screening Tool    ASSESSMENT:   Cheryl [name] is a 74 y.o. female with medical history significant for Hashimoto's, diverticulosis.  Patient presented to the ED with complaints of persistent and gradually worsening difficulty breathing over the past month since she was hospitalized.  She reports persistent dry cough.  No fevers no chills.  No chest pain.  No lower extremity swelling.  5/12- s/p renal biopsy  Reviewed I/O's: -950 ml x 24 hours and -2.4 L since admission  UOP: 1.8 L x 24 hours  Po 60-90%  Per nephrology notes, pt with MPO ANCA causing posse immune necrotizing glomerulonephritis with associated AKI; biopsy showed 65% crescents with minimal scarring and also ATN.    Spoke with pt at bedside, who was pleasant and in good spirits today. She reports she is trying to stay positive, as she misses her grandchildren and is excited to return home with them (pt looking a family photos and videos on her phone prior to RD visit).   Pt reports decreased oral intake over the past 3 months PTA, due to hospitalization for pneumonia as well as altered taste perception ("it felt that my mouth was coated with paint"). She estimates little to no oral intake over 2 weeks PTA due to this. Since being admitted to the hospital, these symptoms have resolved and pt is now consuming 100% of  meals.   Pt shares that she was losing weight intentionally PTA, but estimates she has lost approximately 20 pounds over the past 3 months secondary to sickness. Per wt hx, pt has experienced a 3.7% wt loss over the past month, which is not significant for time frame.   Discussed importance of good meal and supplement intake to promote healing. Also reviewed low sodium, general, healthful diet.   Labs reviewed: K: 3.3, Phos: 5.6.   NUTRITION - FOCUSED PHYSICAL EXAM:    Most Recent Value  Orbital Region  No depletion  Upper Arm Region  No depletion  Thoracic and Lumbar Region  No depletion  Buccal Region  No depletion  Temple Region  No depletion  Clavicle Bone Region  No depletion  Clavicle and Acromion Bone Region  No depletion  Scapular Bone Region  No depletion  Dorsal Hand  No depletion  Patellar Region  No depletion  Anterior Thigh Region  No depletion  Posterior Calf Region  No depletion  Edema (RD Assessment)  None  Hair  Reviewed  Eyes  Reviewed  Mouth  Reviewed  Skin  Reviewed  Nails  Reviewed       Diet Order:   Diet Order            Diet renal with fluid restriction Fluid restriction: 1200 mL Fluid; Room service appropriate? Yes; Fluid consistency: Thin  Diet effective now              EDUCATION NEEDS:  No education needs have been identified at this time  Skin:  Skin Assessment: Skin Integrity Issues: Skin Integrity Issues:: Incisions Incisions: closed rt back  Last BM:  05/05/20  Height:   Ht Readings from Last 1 Encounters:  04/29/20 4\' 11"  (1.499 m)    Weight:   Wt Readings from Last 1 Encounters:  05/01/20 94.8 kg    Ideal Body Weight:  44.5 kg  BMI:  Body mass index is 42.21 kg/m.  Estimated Nutritional Needs:   Kcal:  1400-1600  Protein:  65-80 grams  Fluid:  1.2 L    Loistine Chance, RD, LDN, [place] Registered Dietitian II Certified Diabetes Care and Education Specialist Please refer to [facility] for RD and/or RD  on-call/weekend/after hours pager

## 2020-05-06 NOTE — Progress Notes (Signed)
[facility] Progress Note     Assessment/ Plan:   74 y.o.female with PMH GERD, arthritis, prior sleep apnea, and constipation, who presented with worsening of outpatient labs with worsened renal failure, Cr had increased from 1.4 to 7.11 in 1 month.  1. 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 Started on solumedrol 1000mg  QD and oral cytoxan late on 5/13. Positive ANA, GBM AB neg.  a. Creatinine starting to stabilize and for 1st time decreasing today; BUN incr from the steroids and  catabolic state. No uremic signs; should be able to be d/c later in week as long as trend continues with outpt [facility] clinic f/u.  b. Continue oral Cytoxan as ordered -> being renally dosed at 1mg /kg c. Status post IV methylprednisolone  d. Started on  oral prednisone 60 mg daily with taper to follow as outpt e. Continue monitor urine output 2. Hyperkalemia: Associated with kidney disease now significantly improved.  3. Non-anion gap metabolic acidosis: Has improved with kidney function stabilization. . 4. Hyperphosphatemia:Started sevelamer 800 mg 3 times daily 5. Proteinuria -secondary to glomerulonephritis. 6. Hematuria -secondaryto glomerulonephritis 7. Dyspnea -improved with treatments as above 8. HTN -blood pressure improved at this time. Continue to monitor 9. Normocytic Anemia-likely anemia associated with inflammation. Receiving Feraheme today. On Aranesp 100 mcg every 2 week. Continue to monitor 10. Leukocytosis: Likely steroid effect. Continue to monitor 11. Renal Cysts- seen on CT renal stone study, Renal US with 3 simple renal cysts, 2 on right measuring 3.7 x 3.1 x 3.2 cm and 2.6 x 3.0 x 2.9 cm, with 1 on left measuring 3.3 x 2.9 x 3.6 cm. Not as dramatic on u/s- Not indicative of a polycystic dz  Subjective:   Feels better; occasional dizziness has  resolved. Denies f/cn/v/dsypnea/ CP. GREAT UOP per pt and good appetite.   Objective:   BP (!) 147/70 (BP Location: Right Arm)   Pulse 76   Temp 98.7 F (37.1 C) (Oral)   Resp 15   Ht 4\' 11"  (1.499 m)   Wt 94.8 kg   SpO2 94%   BMI 42.21 kg/m   Intake/Output Summary (Last 24 hours) at 05/06/2020 1051 Last data filed at 05/06/2020 0942 Gross per 24 hour  Intake 1100 ml  Output 1000 ml  Net 100 ml   Weight change:   Physical Exam: General:74 y.o.femalein NAD Cardio:Normal rate, no murmurs Lungs:Bilateral chest rise, no increased work of breathing Abdomen: Soft,nondistended Skin: warm and dry Extremities: No edema  Imaging: No results found.  Labs: BMET Recent Labs  Lab 04/30/20 0827 05/01/20 0138 05/02/20 0207 05/03/20 0353 05/04/20 0345 05/05/20 0453 05/06/20 0241  NA 139 137 134* 136 139 140 140  K 5.2* 5.7* 5.4* 4.9 4.2 3.6 3.3*  CL 109 106 104 102 103 104 105  CO2 19* 18* 19* 21* 21* 21* 20*  GLUCOSE 106* 107* 188* 140* 130* 105* 101*  BUN 54* 52* 54* 64* 77* 89* 100*  CREATININE 7.14* 6.97* 7.11* 7.36* 7.28* 7.24* 6.95*  CALCIUM 8.7* 8.7* 8.7* 8.6* 8.5* 8.3* 8.0*  PHOS  --  4.7* 4.7* 5.1* 6.8* 6.7* 5.6*   CBC Recent Labs  Lab 04/29/20 1424 04/30/20 0827 05/03/20 0353 05/04/20 0345 05/05/20 0453 05/06/20 0241  WBC 11.0*   < > 22.8* 18.2* 18.8* 20.6*  NEUTROABS 8.1*  --   --   --   --   --   HGB 9.5*   < >  7.6* 7.3* 7.8* 7.8*  HCT 31.9*   < > 24.4* 23.0* 24.7* 24.3*  MCV 97.3   < > 91.0 91.3 90.8 91.4  PLT 808*   < > 705* 722* 725* 719*   < > = values in this interval not displayed.    Medications:    . bisacodyl  10 mg Rectal Once  . cyclophosphamide  100 mg Oral Daily  . darbepoetin (ARANESP) injection - NON-DIALYSIS  100 mcg Subcutaneous Q Fri-1800  . feeding supplement  1 Container Oral BID BM  . folic acid  1 mg Oral Daily  . linaclotide  290 mcg Oral QAC breakfast  . pantoprazole  40 mg Oral Daily  . predniSONE  60 mg Oral  Q breakfast  . raloxifene  60 mg Oral Daily  . senna-docusate  2 tablet Oral BID  . sevelamer carbonate  800 mg Oral TID WC      Otelia Santee, MD 05/06/2020, 10:51 AM

## 2020-05-06 NOTE — Progress Notes (Signed)
PROGRESS NOTE    Cheryl [name]  BSW:[id number] DOB: 06-14-1946 DOA: 04/29/2020 PCP: Sharilyn Sites, MD    Brief Narrative:  Cheryl [name] is a 74 y.o. female with medical history significant for Hashimoto's, diverticulosis.  Patient presented to the ED with complaints of persistent and gradually worsening difficulty breathing over the past month since she was hospitalized.  She reports persistent dry cough.  No fevers no chills.  No chest pain.  No lower extremity swelling. Patient saw her primary care provider, and was referred to the ED due to elevated creatinine. She denies nausea, vomiting or loose stools, she reports stable oral intake, she reports recent NSAID use.  She denies diuretic use.  No family history of lung disease that she is aware of.  She reports generalized abdominal queasiness over the past 1 to 2 weeks.  She denies urinary frequency or dysuria.  Recent hospitalization 4/8- 4/10, treated for multifocal pneumonia with acute hypoxic respiratory failure, treated with ceftriaxone and azithromycin, inpatient and discharged on a Omnicef and azithromycin.  She did not require supplemental oxygen on discharge.   ED Course: Stable vitals.  Creatinine elevated at 7.11.  Unremarkable BNP 44. Platelets elevated at 808.  Hemoglobin low at 9.5.  Likely large hemoglobin, protein, positive leukocytes, greater than 50 WBCs, no bacteria.  2 view chest xray shows ill-defined opacities throughout the lungs, stable or slightly worsened since prior study.  1 L bolus normal saline given, IV ceftriaxone started. Hospitalist to admit for acute kidney injury.    Assessment & Plan:   Principal Problem:   Glomerulonephritis Active Problems:   Diverticulosis of colon without hemorrhage   Hashimoto's thyroiditis   ARF (acute renal failure) (HCC)   Acute renal failure 2/2 ANCA MPO Pauci-immune necrotizing glomerulonephritis Creatinine markedly elevated at 7.11 with a BUN of 62 on ED presentation.   Urinalysis showing hemoglobin and protein.  CT renal stone study with no urinary tract calculus or hydronephrosis with enlarging bilateral renal cyst.  Ultrasound renal with no hydronephrosis or parenchymal abnormality with multiple simple renal cyst appreciated.  Routine/creatinine ratio elevated 0.37. Complement C3/C4 within normal limits.  ANA positive.  SPEP essentially within normal limits.  GBM antibody negative.  ANCA MPO antibodies 29.6, positive.  Kappa light chain elevated to 40.7.  Lambda light chain elevated 181.8.   --Nephrology following, appreciate assistance --Cr 7.11>7.14>6.97>7.11>7.36>7.28>7.24>6.95 (1.4 one month prior) --Urine output 1800 mL past 24 hours with 5 unmeasured occurrences --CT renal biopsy 5/12, prelim path w/ pauci immune necrotizing GN; awaiting finalized report --Completed 3d course solumedrol 1g, now on prednisone 60mg  PO daily, taper per nephrology --Continue cytoxan 100mg  PO daily --Avoid nephrotoxins, renally dose all medications --Strict I's and O's, specifically close monitoring of urinary output --Follow BMP daily  Hyperkalemia Potassium 3.1 today --Lokelma discontinued by nephrology 5/16 --Continue close monitoring of electrolytes  Acute on chronic anemia  Hemoglobin 8.2, MCV 95.1, stable.  Iron 17, TIBC 239, ferritin 425, folate 5.3, B12 569, reticulocyte count count 1.4  --s/p Feraheme IV 5/12 and 5/16   --Hemoglobin 7.8 today, stable --Continue to monitor CBC, transfuse for hemoglobin less than 7.0  Pyuria Urinalysis with moderate leukoesterase, negative nitrite, no bacteria, greater than 50 WBCs.  Urine culture with no growth.  Initially was started on ceftriaxone, now discontinued.  Leukocytosis WBC count 20.6, likely secondary to steroid side effect with white blood cell de-marginalization. --Monitor CBC daily   DVT prophylaxis: SCDs Code Status: Full code Family Communication: No family present at bedside  Disposition Plan:    Status is: Inpatient  Remains inpatient appropriate because:Persistent severe electrolyte disturbances, Ongoing diagnostic testing needed not appropriate for outpatient work up, Unsafe d/c plan and Inpatient level of care appropriate due to severity of illness awaiting improvement in renal function and nephrology sign off   Dispo: The patient is from: Home              Anticipated d/c is to: Home              Anticipated d/c date is: 3 days              Patient currently is not medically stable to d/c.   Consultants:   Nephrology  Procedures:   CT-guided renal biopsy 04/30/2020  Antimicrobials:   Ceftriaxone  5/11 - 5/11   Subjective: Patient seen and examined bedside, resting comfortably at side of bed eating breakfast. Continues to report good urine output no other complaints or concerns at this time.  Denies headache, no fever/chills/night sweats, nausea/vomiting, no diarrhea, no chest pain, no palpitations, no shortness of breath.  No acute events overnight per nursing staff.  Objective: Vitals:   05/05/20 0438 05/05/20 1624 05/05/20 2044 05/06/20 0420  BP: (!) 144/89 (!) 146/64 (!) 148/77 (!) 147/70  Pulse: 87 79 76 76  Resp: 18 18 14 15   Temp: 97.9 F (36.6 C) 98.5 F (36.9 C) 98 F (36.7 C) 98.7 F (37.1 C)  TempSrc: Oral Oral Oral Oral  SpO2: 92% 94% 100% 94%  Weight:      Height:        Intake/Output Summary (Last 24 hours) at 05/06/2020 1020 Last data filed at 05/06/2020 0942 Gross per 24 hour  Intake 1100 ml  Output 1000 ml  Net 100 ml   Filed Weights   04/29/20 1313 04/29/20 2302 05/01/20 0447  Weight: 90.3 kg 92.6 kg 94.8 kg    Examination:  General exam: Appears calm and comfortable  Respiratory system: Clear to auscultation. Respiratory effort normal. Cardiovascular system: S1 & S2 heard, RRR. No JVD, murmurs, rubs, gallops or clicks. No pedal edema. Gastrointestinal system: Abdomen is nondistended, soft and nontender. No organomegaly or  masses felt. Normal bowel sounds heard. Central nervous system: Alert and oriented. No focal neurological deficits. Extremities: Symmetric 5 x 5 power. Skin: No rashes, lesions or ulcers Psychiatry: Judgement and insight appear normal. Mood & affect appropriate.    Data Reviewed: I have personally reviewed following labs and imaging studies  CBC: Recent Labs  Lab 04/29/20 1424 04/30/20 0827 05/02/20 0207 05/03/20 0353 05/04/20 0345 05/05/20 0453 05/06/20 0241  WBC 11.0*   < > 14.7* 22.8* 18.2* 18.8* 20.6*  NEUTROABS 8.1*  --   --   --   --   --   --   HGB 9.5*   < > 8.2* 7.6* 7.3* 7.8* 7.8*  HCT 31.9*   < > 26.6* 24.4* 23.0* 24.7* 24.3*  MCV 97.3   < > 94.0 91.0 91.3 90.8 91.4  PLT 808*   < > 662* 705* 722* 725* 719*   < > = values in this interval not displayed.   Basic Metabolic Panel: Recent Labs  Lab 05/02/20 0207 05/03/20 0353 05/04/20 0345 05/05/20 0453 05/06/20 0241  NA 134* 136 139 140 140  K 5.4* 4.9 4.2 3.6 3.3*  CL 104 102 103 104 105  CO2 19* 21* 21* 21* 20*  GLUCOSE 188* 140* 130* 105* 101*  BUN 54* 64* 77* 89* 100*  CREATININE  7.11* 7.36* 7.28* 7.24* 6.95*  CALCIUM 8.7* 8.6* 8.5* 8.3* 8.0*  PHOS 4.7* 5.1* 6.8* 6.7* 5.6*   GFR: Estimated Creatinine Clearance: 7.4 mL/min (A) (by C-G formula based on SCr of 6.95 mg/dL (H)). Liver Function Tests: Recent Labs  Lab 04/29/20 1424 05/01/20 0138 05/02/20 0207 05/03/20 0353 05/04/20 0345 05/05/20 0453 05/06/20 0241  AST 19  --   --   --   --   --   --   ALT 10  --   --   --   --   --   --   ALKPHOS 78  --   --   --   --   --   --   BILITOT 0.4  --   --   --   --   --   --   PROT 8.5*  --   --   --   --   --   --   ALBUMIN 3.3*   < > 2.5* 2.4* 2.4* 2.5* 2.3*   < > = values in this interval not displayed.   No results for input(s): LIPASE, AMYLASE in the last 168 hours. No results for input(s): AMMONIA in the last 168 hours. Coagulation Profile: Recent Labs  Lab 04/30/20 1106  INR 1.1    Cardiac Enzymes: No results for input(s): CKTOTAL, CKMB, CKMBINDEX, TROPONINI in the last 168 hours. BNP (last 3 results) No results for input(s): PROBNP in the last 8760 hours. HbA1C: No results for input(s): HGBA1C in the last 72 hours. CBG: No results for input(s): GLUCAP in the last 168 hours. Lipid Profile: No results for input(s): CHOL, HDL, LDLCALC, TRIG, CHOLHDL, LDLDIRECT in the last 72 hours. Thyroid Function Tests: No results for input(s): TSH, T4TOTAL, FREET4, T3FREE, THYROIDAB in the last 72 hours. Anemia Panel: No results for input(s): VITAMINB12, FOLATE, FERRITIN, TIBC, IRON, RETICCTPCT in the last 72 hours. Sepsis Labs: No results for input(s): PROCALCITON, LATICACIDVEN in the last 168 hours.  Recent Results (from the past 240 hour(s))  Urine culture     Status: None   Collection Time: 04/29/20  2:21 PM   Specimen: Urine, Random  Result Value Ref Range Status   Specimen Description   Final    URINE, RANDOM Performed at [facility], [address] [place], [place] [address]    Special Requests   Final    NONE Performed at [facility], [address] [place], [place] [address]    Culture   Final    NO GROWTH Performed at [facility], [place] [address], [place], [place] [address]    Report Status 04/30/2020 FINAL  Final  SARS Coronavirus 2 by RT PCR (hospital order, performed in [facility] hospital lab) Nasopharyngeal Nasopharyngeal Swab     Status: None   Collection Time: 04/29/20  2:23 PM   Specimen: Nasopharyngeal Swab  Result Value Ref Range Status   SARS Coronavirus 2 NEGATIVE NEGATIVE Final    Comment: (NOTE) SARS-CoV-2 target nucleic acids are NOT DETECTED. The SARS-CoV-2 RNA is generally detectable in upper and lower respiratory specimens during the acute phase of infection. The lowest concentration of SARS-CoV-2 viral copies this assay can detect is 250 copies / mL. A negative result does not preclude SARS-CoV-2 infection and should not  be used as the sole basis for treatment or other patient management decisions.  A negative result may occur with improper specimen collection / handling, submission of specimen other than nasopharyngeal swab, presence of viral mutation(s)  within the areas targeted by this assay, and inadequate number of viral copies (<250 copies / mL). A negative result must be combined with clinical observations, patient history, and epidemiological information. Fact Sheet for Patients:   StrictlyIdeas.no Fact Sheet for Healthcare Providers: BankingDealers.co.za This test is not yet approved or cleared  by the [place] FDA and has been authorized for detection and/or diagnosis of SARS-CoV-2 by FDA under an Emergency Use Authorization (EUA).  This EUA will remain in effect (meaning this test can be used) for the duration of the COVID-19 declaration under Section 564(b)(1) of the Act, 21 U.S.C. section 360bbb-3(b)(1), unless the authorization is terminated or revoked sooner. Performed at [facility], [address] [place], [place] [address]          Radiology Studies: No results found.      Scheduled Meds: . bisacodyl  10 mg Rectal Once  . cyclophosphamide  100 mg Oral Daily  . darbepoetin (ARANESP) injection - NON-DIALYSIS  100 mcg Subcutaneous Q Fri-1800  . feeding supplement  1 Container Oral BID BM  . folic acid  1 mg Oral Daily  . linaclotide  290 mcg Oral QAC breakfast  . pantoprazole  40 mg Oral Daily  . predniSONE  60 mg Oral Q breakfast  . raloxifene  60 mg Oral Daily  . senna-docusate  2 tablet Oral BID  . sevelamer carbonate  800 mg Oral TID WC   Continuous Infusions:    LOS: 7 days    Time spent: 34 minutes spent on chart review, discussion with nursing staff, consultants, updating family and interview/physical exam; more than 50% of that time was spent in counseling and/or coordination of care.    Quintasia Theroux J [place]  DO [facility] Available via Epic secure chat 7am-7pm After these hours, please refer to coverage provider listed on [email or web address] 05/06/2020, 10:20 AM

## 2020-05-07 DIAGNOSIS — N179 Acute kidney failure, unspecified: Secondary | ICD-10-CM

## 2020-05-07 DIAGNOSIS — N059 Unspecified nephritic syndrome with unspecified morphologic changes: Secondary | ICD-10-CM

## 2020-05-07 LAB — CBC
HCT: 24.8 % — ABNORMAL LOW (ref 36.0–46.0)
Hemoglobin: 7.8 g/dL — ABNORMAL LOW (ref 12.0–15.0)
MCH: 28.8 pg (ref 26.0–34.0)
MCHC: 31.5 g/dL (ref 30.0–36.0)
MCV: 91.5 fL (ref 80.0–100.0)
Platelets: 712 10*3/uL — ABNORMAL HIGH (ref 150–400)
RBC: 2.71 MIL/uL — ABNORMAL LOW (ref 3.87–5.11)
RDW: 17.1 % — ABNORMAL HIGH (ref 11.5–15.5)
WBC: 19.8 10*3/uL — ABNORMAL HIGH (ref 4.0–10.5)
nRBC: 2 % — ABNORMAL HIGH (ref 0.0–0.2)

## 2020-05-07 LAB — RENAL FUNCTION PANEL
Albumin: 2.3 g/dL — ABNORMAL LOW (ref 3.5–5.0)
Anion gap: 14 (ref 5–15)
BUN: 108 mg/dL — ABNORMAL HIGH (ref 8–23)
CO2: 22 mmol/L (ref 22–32)
Calcium: 8.1 mg/dL — ABNORMAL LOW (ref 8.9–10.3)
Chloride: 106 mmol/L (ref 98–111)
Creatinine, Ser: 6.71 mg/dL — ABNORMAL HIGH (ref 0.44–1.00)
GFR calc Af Amer: 7 mL/min — ABNORMAL LOW (ref >60)
GFR calc non Af Amer: 6 mL/min — ABNORMAL LOW (ref >60)
Glucose, Bld: 126 mg/dL — ABNORMAL HIGH (ref 70–99)
Phosphorus: 5.5 mg/dL — ABNORMAL HIGH (ref 2.5–4.6)
Potassium: 3.4 mmol/L — ABNORMAL LOW (ref 3.5–5.1)
Sodium: 142 mmol/L (ref 135–145)

## 2020-05-07 MED ORDER — PREDNISONE 20 MG PO TABS: 60.0000 mg | ORAL_TABLET | Freq: Every day | ORAL | 0 refills | Status: DC

## 2020-05-07 MED ORDER — SEVELAMER CARBONATE 800 MG PO TABS: 800.0000 mg | ORAL_TABLET | Freq: Three times a day (TID) | ORAL | 0 refills | Status: DC

## 2020-05-07 MED ORDER — CYCLOPHOSPHAMIDE 50 MG PO CAPS: 100.0000 mg | ORAL_CAPSULE | Freq: Every day | ORAL | 0 refills | Status: AC

## 2020-05-07 MED ORDER — PREDNISONE 20 MG PO TABS: 60.0000 mg | ORAL_TABLET | Freq: Every day | ORAL | 0 refills | Status: AC

## 2020-05-07 MED ORDER — CYCLOPHOSPHAMIDE 50 MG PO CAPS: 100.0000 mg | ORAL_CAPSULE | Freq: Every day | ORAL | 0 refills | Status: DC

## 2020-05-07 MED ORDER — SEVELAMER CARBONATE 800 MG PO TABS: 800.0000 mg | ORAL_TABLET | Freq: Three times a day (TID) | ORAL | 0 refills | Status: AC

## 2020-05-07 MED FILL — CYCLOPHOSPHAMIDE 50 MG CAPS: 50 | 30 days supply | Qty: 60 | Fill #0

## 2020-05-07 MED FILL — predniSONE 20 MG TABS: 20 | 30 days supply | Qty: 90 | Fill #0

## 2020-05-07 NOTE — Consult Note (Signed)
   [facility] Inpatient Consult   05/07/2020  Haizley Cannella [facility] 08/17/1946 [id number]  [facility] ACO Patient: Medicare NextGen  Patient was assessed for [place] Management for community services for high risk score and length of stay hospitalization. Patient was previously outreached by [facility].  Call placed to the  patient without success. Patient noted to have an admission diagnosis of multi-focal pneumonia.  Epic encounter review reveals the patient has had Moderna vaccine in February. Currently, home with home health noted.   Plan:  Will have outreach restarted for post hospital follow up.  Primary Care Provider:  Sharilyn Sites, MD, this office provides the Transition of Care follow up at [facility]..  Of note, [facility] Care Management services does not replace or interfere with any services that are arranged by inpatient [facility] care management team.   For additional questions or referrals please contact:   Natividad Brood, RN BSN [facility] Liaison  [phone] business mobile phone Toll free office [phone]  Fax number: [phone] [email or web address]

## 2020-05-07 NOTE — Discharge Summary (Signed)
Physician Discharge Summary  Cheryl [name] SWF:[id number] DOB: 1946/08/03 DOA: 04/29/2020  PCP: Sharilyn Sites, MD  Admit date: 04/29/2020 Discharge date: 05/07/2020  Admitted From: Home Disposition:  Home  Recommendations for Outpatient Follow-up:  1. Follow up with PCP in 2-3 weeks 2. Follow up with [facility] as scheduled  Discharge Condition:Stable CODE STATUS:Full Diet recommendation: Regular   Brief/Interim Summary: 74 y.o.femalewith medical history significant forHashimoto's, diverticulosis.Patient presented to the ED with complaints of persistent and gradually worsening difficulty breathing over the past month since she was hospitalized. She reports persistent dry cough. No fevers no chills. No chest pain. No lower extremity swelling. Patient saw her primary care provider, and was referred to the ED due to elevated creatinine. She denies nausea, vomitingorloose stools, she reports stable oral intake, she reports recent NSAID use. She denies diuretic use. No family history of lungdisease that sheis awareof. She reports generalized abdominal queasiness over the past 1 to 2 weeks. She denies urinary frequency or dysuria.  Recent hospitalization 4/8-4/10,treated for multifocal pneumonia with acute hypoxic respiratory failure,treated with ceftriaxoneand azithromycin,inpatient and discharged on a Omnicef and azithromycin. She did not require supplemental oxygen on discharge.   ED Course:Stable vitals. Creatinine elevated at 7.11. Unremarkable BNP 44. Platelets elevated at 808. Hemoglobin low at 9.5. Likely large hemoglobin, protein, positive leukocytes, greater than 50 WBCs, no bacteria. 2 view chest xray showsill-defined opacities throughout the lungs,stable or slightly worsened since prior study. 1 L bolus normal saline given, IV ceftriaxone started. Hospitalist to admit for acute kidney injury  Discharge Diagnoses:  Principal Problem:    Glomerulonephritis Active Problems:   Diverticulosis of colon without hemorrhage   Hashimoto's thyroiditis   ARF (acute renal failure) (HCC)  Acute renal failure 2/2 ANCA MPO Pauci-immune necrotizing glomerulonephritis -Creatinine markedly elevated at 7.11 with a BUN of 62 on ED presentation.  Urinalysis showing hemoglobin and protein.  CT renal stone study with no urinary tract calculus or hydronephrosis with enlarging bilateral renal cyst.  Ultrasound renal with no hydronephrosis or parenchymal abnormality with multiple simple renal cyst appreciated.  Routine/creatinine ratio elevated 0.37. Complement C3/C4 within normal limits.  ANA positive.  SPEP essentially within normal limits.  GBM antibody negative.  ANCA MPO antibodies 29.6, positive.  Kappa light chain elevated to 40.7.  Lambda light chain elevated 181.8.   --Nephrology following, appreciate assistance --Cr trends improved gradually --CT renal biopsy 5/12, prelim path w/ pauci immune necrotizing GN --Completed 3d course solumedrol 1g, now on prednisone 60mg  PO daily, taper per nephrology --Continue cytoxan 100mg  PO daily --Avoid nephrotoxins, renally dose all medications --OK for d/c per Nephrology. Pt to cont above regimen, taper prednisone per Nephrology on outpatient f/u  Hyperkalemia --Lokelma discontinued by nephrology 5/16 --Continue close monitoring of electrolytes  Acute on chronic anemia  Hemoglobin 8.2, MCV 95.1, stable.  Iron 17, TIBC 239, ferritin 425, folate 5.3, B12 569, reticulocyte count count 1.4  --s/p Feraheme IV 5/12 and 5/16   --Hemoglobin overall stable  Pyuria Urinalysis with moderate leukoesterase, negative nitrite, no bacteria, greater than 50 WBCs.  Urine culture with no growth.  Initially was started on ceftriaxone, now discontinued.  Leukocytosis WBC count 20.6, likely secondary to steroid side effect with white blood cell de-marginalization. Remained hemodynamically  stable Afebrile  Discharge Instructions   Allergies as of 05/07/2020      Reactions   Prednisone Other (See Comments)   Hallucinations    Tramadol Nausea Only   Aspirin Nausea And Vomiting   Celecoxib Rash  Medication List    TAKE these medications   acetaminophen 500 MG tablet Commonly known as: TYLENOL Take 500-1,000 mg by mouth every 6 (six) hours as needed (for pain/headache).   albuterol 108 (90 Base) MCG/ACT inhaler Commonly known as: VENTOLIN HFA Inhale 2 puffs into the lungs every 6 (six) hours as needed for wheezing or shortness of breath.   buPROPion 300 MG 24 hr tablet Commonly known as: WELLBUTRIN XL Take 300 mg by mouth daily.   CALCIUM-VITAMIN D PO Take 5 mLs by mouth every morning.   cyclophosphamide 50 MG capsule Commonly known as: CYTOXAN Take 2 capsules (100 mg total) by mouth daily. Take with food to minimize GI upset. Take early in the day and maintain hydration.   diclofenac sodium 1 % Gel Commonly known as: VOLTAREN Apply 2-4 g topically 4 (four) times daily. What changed:   when to take this  reasons to take this   guaiFENesin 600 MG 12 hr tablet Commonly known as: MUCINEX Take 1 tablet (600 mg total) by mouth 2 (two) times daily. What changed:   when to take this  reasons to take this   linaclotide 290 MCG Caps capsule Commonly known as: Linzess Take 1 capsule (290 mcg total) by mouth daily before breakfast.   montelukast 10 MG tablet Commonly known as: SINGULAIR Take 1 tablet (10 mg total) by mouth every morning.   nystatin 100000 UNIT/ML suspension Commonly known as: MYCOSTATIN Take 5 mLs by mouth 2 (two) times daily as needed (for thrush).   ondansetron 4 MG tablet Commonly known as: ZOFRAN Take 4 mg by mouth every 8 (eight) hours as needed for nausea or vomiting.   pantoprazole 40 MG tablet Commonly known as: PROTONIX Take 1 tablet (40 mg total) by mouth daily.   polyethylene glycol 17 g packet Commonly known  as: MIRALAX / GLYCOLAX Take 17 g by mouth daily as needed for mild constipation or moderate constipation.   predniSONE 20 MG tablet Commonly known as: DELTASONE Take 3 tablets (60 mg total) by mouth daily with breakfast.   raloxifene 60 MG tablet Commonly known as: EVISTA Take 60 mg by mouth daily.   rOPINIRole 0.25 MG tablet Commonly known as: REQUIP Take 0.75 mg by mouth daily as needed (for rls).   sevelamer carbonate 800 MG tablet Commonly known as: RENVELA Take 1 tablet (800 mg total) by mouth 3 (three) times daily with meals.   tolterodine 1 MG tablet Commonly known as: DETROL Take 1 mg by mouth 2 (two) times daily.   vitamin B-12 1000 MCG tablet Commonly known as: CYANOCOBALAMIN Take 1,000 mcg by mouth daily.   Vitamin D3 125 MCG (5000 UT) Caps Take 1 capsule by mouth daily.      Follow-up Information    Dwana Melena, MD Follow up.   Specialty: Nephrology Why: Follow up as scheduled Contact information: [place] [address] [phone]        Sharilyn Sites, MD. Schedule an appointment as soon as possible for a visit in 2 week(s).   Specialty: Family Medicine Contact information: [address] [place] [address] [phone]          Allergies  Allergen Reactions  . Prednisone Other (See Comments)    Hallucinations   . Tramadol Nausea Only  . Aspirin Nausea And Vomiting  . Celecoxib Rash    Consultations:  Nephrology  Procedures/Studies: DG Chest 2 View  Result Date: 04/29/2020 CLINICAL DATA:  Shortness of breath EXAM: CHEST -  2 VIEW COMPARISON:  04/25/2020 FINDINGS: Ill-defined pulmonary opacities are again noted within the lungs, stable are slightly increased since prior study. Heart is normal size. Linear scarring or atelectasis in the lingula. No effusions or pneumothorax. No acute bony abnormality. IMPRESSION: Ill-defined opacities throughout the lungs, stable or slightly worsened since prior study.  Electronically Signed   By: Rolm Baptise M.D.   On: 04/29/2020 14:02   DG Chest 2 View  Result Date: 04/25/2020 CLINICAL DATA:  History of recent pneumonia EXAM: CHEST - 2 VIEW COMPARISON:  03/27/2020, 11/19/2016 FINDINGS: Partial but incomplete clearing of ill-defined pulmonary opacities. Linear areas of scarring bilaterally. No pleural effusion. Stable cardiomediastinal silhouette. No pneumothorax. IMPRESSION: Partial but incomplete clearing of ill-defined pulmonary opacities possibly due to slowly resolving pneumonia or post infectious scarring. Electronically Signed   By: Donavan Foil M.D.   On: 04/25/2020 22:41   US RENAL  Result Date: 04/30/2020 CLINICAL DATA:  Acute kidney injury EXAM: RENAL / URINARY TRACT ULTRASOUND COMPLETE COMPARISON:  None. FINDINGS: Right Kidney: Renal measurements: 9.2 x 4.5 x 4.6 cm = volume: 95.6 mL. There are 2 discrete simple renal cysts. The larger measures 3.7 x 3.1 x 3.2 cm and the smaller measures 2.6 x 3.0 x 2.9 cm. No hydronephrosis. Normal renal echogenicity. No shadowing calculi. Left Kidney: Renal measurements: 9.8 x 5.4 x 5.4 cm = volume: 151.1 mL. There is a simple renal cyst measuring 3.3 x 2.9 x 3 6 cm. No hydronephrosis or shadowing calculus. Normal parenchymal echogenicity. Bladder: Appears normal for degree of bladder distention. Other: None. IMPRESSION: No hydronephrosis or parenchymal abnormality. Multiple simple renal cysts. Electronically Signed   By: Ulyses Jarred M.D.   On: 04/30/2020 00:04   CT BIOPSY  Result Date: 04/30/2020 INDICATION: Acute on chronic renal insufficiency. Please perform CT-guided biopsy for tissue diagnostic purposes. EXAM: CT-GUIDED RANDOM RENAL BIOPSY COMPARISON:  Renal ultrasound-04/30/2020; CT abdomen pelvis-04/29/2020 MEDICATIONS: None. ANESTHESIA/SEDATION: Fentanyl 25 mcg IV; Versed 0.5 mg IV Sedation time: 14 minutes; The patient was continuously monitored during the procedure by the interventional radiology nurse under  my direct supervision. CONTRAST:  None. COMPLICATIONS: None immediate. PROCEDURE: Given presence of known multiple bilateral renal cysts, the decision was made to pursue right renal biopsy with CT guidance. Informed consent was obtained from the patient following an explanation of the procedure, risks, benefits and alternatives. A time out was performed prior to the initiation of the procedure. The patient was positioned prone on the CT table and a limited CT was performed for procedural planning demonstrating multiple hypoattenuating renal cysts bilaterally. The inferior pole within the right kidney was targeted for random renal biopsy given lack of adjacent cysts as well as adequate percutaneous window. The procedure was planned. The operative site was prepped and draped in the usual sterile fashion. Appropriate trajectory was confirmed with a 22 gauge spinal needle after the adjacent tissues were anesthetized with 1% Lidocaine with epinephrine. Under intermittent CT guidance, a 17 gauge coaxial needle was advanced into the peripheral aspect of inferolateral aspect of the right kidney. Appropriate positioning was confirmed and 3 core needle biopsy samples were obtained with an 18 gauge core needle biopsy device. The co-axial needle was removed following administration of a Gel-Foam slurry and superficial hemostasis was achieved with manual compression. A limited postprocedural CT was negative for hemorrhage or additional complication. A dressing was placed. The patient tolerated the procedure well without immediate postprocedural complication. IMPRESSION: Technically successful CT guided core needle biopsy of inferior pole of the  right kidney. Electronically Signed   By: Sandi Mariscal M.D.   On: 04/30/2020 15:17   CT RENAL STONE STUDY  Result Date: 04/29/2020 CLINICAL DATA:  Nonspecific flank pain with discolored urine. No reported history of renal calculi. EXAM: CT ABDOMEN AND PELVIS WITHOUT CONTRAST TECHNIQUE:  Multidetector CT imaging of the abdomen and pelvis was performed following the standard protocol without IV contrast. COMPARISON:  Abdominopelvic CT 02/19/2012. Chest radiographs 04/29/2020 and 11/19/2016. FINDINGS: Lower chest: Chronic lung disease with diffuse bronchiectasis, subpleural reticulation and architectural distortion at both lung bases. There are scattered ill-defined nodular densities which could reflect scarring or inflammation. No discrete pulmonary nodules. No significant pleural or pericardial effusion. Hepatobiliary: The liver appears unremarkable as imaged in the noncontrast state. The gallbladder is incompletely distended. There is a nitrogen containing gallstone, but no apparent significant gallbladder wall thickening, surrounding inflammation or biliary dilatation. Pancreas: Unremarkable. No pancreatic ductal dilatation or surrounding inflammatory changes. Spleen: Normal in size without focal abnormality. Adrenals/Urinary Tract: Both adrenal glands appear normal. There are enlarging low-density renal lesions bilaterally, likely all cysts. No suspicious renal findings on noncontrast imaging. No evidence of urinary tract calculus, hydronephrosis or perinephric soft tissue stranding. The bladder is incompletely distended with possible mild wall thickening. Stomach/Bowel: No evidence of bowel wall thickening, distention or surrounding inflammatory change. The cecum is in the right upper quadrant of the abdomen. The appendix appears normal. There is fluid throughout the proximal to mid colon. Mild descending and sigmoid colon diverticulosis. Vascular/Lymphatic: There are no enlarged abdominal or pelvic lymph nodes. Mild aortic and branch vessel atherosclerosis. No acute vascular findings on noncontrast imaging. Reproductive: The uterus and ovaries appear normal. No adnexal mass. Other: A periumbilical hernia containing only fat has mildly enlarged. There is no herniated bowel, ascites or free air.  Musculoskeletal: No acute or significant osseous findings. Chronic bilateral femoral head avascular necrosis without subchondral collapse. Chronic progressive atrophy within the anterior musculature of both proximal thighs. IMPRESSION: 1. No evidence of urinary tract calculus or hydronephrosis. 2. Enlarging bilateral renal cysts. 3. Cholelithiasis without evidence of cholecystitis or biliary dilatation. 4. Fluid-filled proximal to mid colon as can be seen with diarrheal state. No evidence of acute inflammation. 5. Progressive chronic lung disease at both lung bases with associated architectural distortion, subpleural reticulation and bronchiectasis. Some of this could relate to resolving superimposed inflammation. 6. Chronic bilateral femoral head avascular necrosis without subchondral collapse. 7. Aortic Atherosclerosis (ICD10-I70.0). Electronically Signed   By: Richardean Sale M.D.   On: 04/29/2020 15:06     Subjective: Eager to go home  Discharge Exam: Vitals:   05/06/20 2142 05/07/20 0527  BP: (!) 154/72 140/74  Pulse: 69 69  Resp: 18 16  Temp: 98 F (36.7 C) 98.6 F (37 C)  SpO2: 94% 99%   Vitals:   05/06/20 1403 05/06/20 2142 05/07/20 0527 05/07/20 0900  BP: (!) 153/79 (!) 154/72 140/74   Pulse: 65 69 69   Resp: 18 18 16    Temp: 98 F (36.7 C) 98 F (36.7 C) 98.6 F (37 C)   TempSrc: Oral  Oral   SpO2: 97% 94% 99%   Weight:    93.4 kg  Height:        General: Pt is alert, awake, not in acute distress Cardiovascular: RRR, S1/S2 +, no rubs, no gallops Respiratory: CTA bilaterally, no wheezing, no rhonchi Abdominal: Soft, NT, ND, bowel sounds + Extremities: no edema, no cyanosis   The results of significant diagnostics from this hospitalization (  including imaging, microbiology, ancillary and laboratory) are listed below for reference.     Microbiology: Recent Results (from the past 240 hour(s))  Urine culture     Status: None   Collection Time: 04/29/20  2:21 PM    Specimen: Urine, Random  Result Value Ref Range Status   Specimen Description   Final    URINE, RANDOM Performed at [facility], [address], [place], [place] [address]    Special Requests   Final    NONE Performed at [facility], [address] [place], [place] [address]    Culture   Final    NO GROWTH Performed at [facility], [place] [address], [place], [place] [address]    Report Status 04/30/2020 FINAL  Final  SARS Coronavirus 2 by RT PCR (hospital order, performed in [facility] hospital lab) Nasopharyngeal Nasopharyngeal Swab     Status: None   Collection Time: 04/29/20  2:23 PM   Specimen: Nasopharyngeal Swab  Result Value Ref Range Status   SARS Coronavirus 2 NEGATIVE NEGATIVE Final    Comment: (NOTE) SARS-CoV-2 target nucleic acids are NOT DETECTED. The SARS-CoV-2 RNA is generally detectable in upper and lower respiratory specimens during the acute phase of infection. The lowest concentration of SARS-CoV-2 viral copies this assay can detect is 250 copies / mL. A negative result does not preclude SARS-CoV-2 infection and should not be used as the sole basis for treatment or other patient management decisions.  A negative result may occur with improper specimen collection / handling, submission of specimen other than nasopharyngeal swab, presence of viral mutation(s) within the areas targeted by this assay, and inadequate number of viral copies (<250 copies / mL). A negative result must be combined with clinical observations, patient history, and epidemiological information. Fact Sheet for Patients:   StrictlyIdeas.no Fact Sheet for Healthcare Providers: BankingDealers.co.za This test is not yet approved or cleared  by the [place] FDA and has been authorized for detection and/or diagnosis of SARS-CoV-2 by FDA under an Emergency Use Authorization (EUA).  This EUA will remain in effect (meaning this test can be  used) for the duration of the COVID-19 declaration under Section 564(b)(1) of the Act, 21 U.S.C. section 360bbb-3(b)(1), unless the authorization is terminated or revoked sooner. Performed at [facility], [address] [place], [place] [address]      Labs: BNP (last 3 results) Recent Labs    04/29/20 1424  BNP 74.0   Basic Metabolic Panel: Recent Labs  Lab 05/03/20 0353 05/04/20 0345 05/05/20 0453 05/06/20 0241 05/07/20 0236  NA 136 139 140 140 142  K 4.9 4.2 3.6 3.3* 3.4*  CL 102 103 104 105 106  CO2 21* 21* 21* 20* 22  GLUCOSE 140* 130* 105* 101* 126*  BUN 64* 77* 89* 100* 108*  CREATININE 7.36* 7.28* 7.24* 6.95* 6.71*  CALCIUM 8.6* 8.5* 8.3* 8.0* 8.1*  PHOS 5.1* 6.8* 6.7* 5.6* 5.5*   Liver Function Tests: Recent Labs  Lab 05/03/20 0353 05/04/20 0345 05/05/20 0453 05/06/20 0241 05/07/20 0236  ALBUMIN 2.4* 2.4* 2.5* 2.3* 2.3*   No results for input(s): LIPASE, AMYLASE in the last 168 hours. No results for input(s): AMMONIA in the last 168 hours. CBC: Recent Labs  Lab 05/03/20 0353 05/04/20 0345 05/05/20 0453 05/06/20 0241 05/07/20 0236  WBC 22.8* 18.2* 18.8* 20.6* 19.8*  HGB 7.6* 7.3* 7.8* 7.8* 7.8*  HCT 24.4* 23.0* 24.7* 24.3* 24.8*  MCV 91.0 91.3 90.8 91.4 91.5  PLT 705* 722*  725* 719* 712*   Cardiac Enzymes: No results for input(s): CKTOTAL, CKMB, CKMBINDEX, TROPONINI in the last 168 hours. BNP: Invalid input(s): POCBNP CBG: No results for input(s): GLUCAP in the last 168 hours. D-Dimer No results for input(s): DDIMER in the last 72 hours. Hgb A1c No results for input(s): HGBA1C in the last 72 hours. Lipid Profile No results for input(s): CHOL, HDL, LDLCALC, TRIG, CHOLHDL, LDLDIRECT in the last 72 hours. Thyroid function studies No results for input(s): TSH, T4TOTAL, T3FREE, THYROIDAB in the last 72 hours.  Invalid input(s): FREET3 Anemia work up No results for input(s): VITAMINB12, FOLATE, FERRITIN, TIBC, IRON, RETICCTPCT in the last  72 hours. Urinalysis    Component Value Date/Time   COLORURINE YELLOW 04/29/2020 1421   APPEARANCEUR CLOUDY (A) 04/29/2020 1421   LABSPEC 1.013 04/29/2020 1421   PHURINE 5.0 04/29/2020 1421   GLUCOSEU NEGATIVE 04/29/2020 1421   HGBUR LARGE (A) 04/29/2020 1421   BILIRUBINUR NEGATIVE 04/29/2020 1421   KETONESUR NEGATIVE 04/29/2020 1421   PROTEINUR >=300 (A) 04/29/2020 1421   UROBILINOGEN 0.2 02/18/2012 2113   NITRITE NEGATIVE 04/29/2020 1421   LEUKOCYTESUR MODERATE (A) 04/29/2020 1421   Sepsis Labs Invalid input(s): PROCALCITONIN,  WBC,  LACTICIDVEN Microbiology Recent Results (from the past 240 hour(s))  Urine culture     Status: None   Collection Time: 04/29/20  2:21 PM   Specimen: Urine, Random  Result Value Ref Range Status   Specimen Description   Final    URINE, RANDOM Performed at [facility], [address] [place], [place] [address]    Special Requests   Final    NONE Performed at [facility], [address] [place], [place] [address]    Culture   Final    NO GROWTH Performed at [facility], [place] [address], [place], [place] [address]    Report Status 04/30/2020 FINAL  Final  SARS Coronavirus 2 by RT PCR (hospital order, performed in [facility] hospital lab) Nasopharyngeal Nasopharyngeal Swab     Status: None   Collection Time: 04/29/20  2:23 PM   Specimen: Nasopharyngeal Swab  Result Value Ref Range Status   SARS Coronavirus 2 NEGATIVE NEGATIVE Final    Comment: (NOTE) SARS-CoV-2 target nucleic acids are NOT DETECTED. The SARS-CoV-2 RNA is generally detectable in upper and lower respiratory specimens during the acute phase of infection. The lowest concentration of SARS-CoV-2 viral copies this assay can detect is 250 copies / mL. A negative result does not preclude SARS-CoV-2 infection and should not be used as the sole basis for treatment or other patient management decisions.  A negative result may occur with improper specimen collection /  handling, submission of specimen other than nasopharyngeal swab, presence of viral mutation(s) within the areas targeted by this assay, and inadequate number of viral copies (<250 copies / mL). A negative result must be combined with clinical observations, patient history, and epidemiological information. Fact Sheet for Patients:   StrictlyIdeas.no Fact Sheet for Healthcare Providers: BankingDealers.co.za This test is not yet approved or cleared  by the [place] FDA and has been authorized for detection and/or diagnosis of SARS-CoV-2 by FDA under an Emergency Use Authorization (EUA).  This EUA will remain in effect (meaning this test can be used) for the duration of the COVID-19 declaration under Section 564(b)(1) of the Act, 21 U.S.C. section 360bbb-3(b)(1), unless the authorization is terminated or revoked sooner. Performed at [facility], [address] [place], [place] [address]    Time spent: 58min  SIGNED:   Marylu Lund, MD  [facility] 05/07/2020, 10:47 AM  If 7PM-7AM, please contact night-coverage

## 2020-05-07 NOTE — Care Management Important Message (Signed)
Important Message  Patient Details  Name: Cheryl [name] MRN: [id number] Date of Birth: 04-04-1946   Medicare Important Message Given:  Yes     Orbie Pyo 05/07/2020, 2:54 PM

## 2020-05-07 NOTE — Progress Notes (Signed)
[facility] Progress Note     Assessment/ Plan:   74 y.o.female with PMH GERD, arthritis, prior sleep apnea, and constipation, who presented with worsening of outpatient labs with worsened renal failure, Cr had increased from 1.4 to 7.11 in 1 month.  1. 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 Started on solumedrol 1000mg  QD and oral cytoxan late on 5/13. Positive ANA, GBM AB neg.  a. Creatininestarting to stabilize and continues to  decrease today; BUN incr from the steroids and  catabolic state. No uremic signs; should be able to be d/c  with outpt [facility] clinic f/u in 2 weeks.  b. Continue oral Cytoxan as ordered-> being renally dosed at 1mg /kg c. Status post IV methylprednisolone  d. Started onoral prednisone 60 mg daily with taper to follow as outpt; will need for minimum of a month. e. Continue monitor urine output 2. Hyperkalemia: Associated with kidney disease now significantly improved.  3. Non-anion gap metabolic acidosis: Has improved with kidney function stabilization. . 4. Hyperphosphatemia:Startedsevelamer 800 mg 3 times daily; we can stop as outpt when renal function improves. 5. Proteinuria -secondary to glomerulonephritis. 6. Hematuria -secondaryto glomerulonephritis 7. Dyspnea -improved with treatments as above 8. HTN -blood pressure improved at this time. Continue to monitor 9. Normocytic Anemia-likely anemia associated with inflammation. Receiving Feraheme today. On Aranesp 100 mcg every 2 week. Continue to monitor 10. Leukocytosis: Likely steroid effect. Continue to monitor 11. Renal Cysts- seen on CT renal stone study, Renal US with 3 simple renal cysts, 2 on right measuring 3.7 x 3.1 x 3.2 cm and 2.6 x 3.0 x 2.9 cm, with 1 on left measuring 3.3 x 2.9 x 3.6 cm. Not as dramatic on u/s- Not indicative of a polycystic dz  Subjective:    Feels better; occasional dizziness has resolved and has not recurred. Denies f/cn/v/dsypnea/ CP. GREAT UOP per pt and good appetite. Ambulating comfortably.   Objective:   BP 140/74 (BP Location: Right Arm)   Pulse 69   Temp 98.6 F (37 C) (Oral)   Resp 16   Ht 4\' 11"  (1.499 m)   Wt 94.8 kg   SpO2 99%   BMI 42.21 kg/m   Intake/Output Summary (Last 24 hours) at 05/07/2020 0744 Last data filed at 05/07/2020 0553 Gross per 24 hour  Intake 605 ml  Output 1450 ml  Net -845 ml   Weight change:   Physical Exam: General:74 y.o.femalein NAD Cardio:Normal rate, no murmurs Lungs:Bilateral chest rise, no increased work of breathing Abdomen: Soft,nondistended Skin: warm and dry Extremities: No edema   Imaging: No results found.  Labs: BMET Recent Labs  Lab 05/01/20 0138 05/02/20 0207 05/03/20 0353 05/04/20 0345 05/05/20 0453 05/06/20 0241 05/07/20 0236  NA 137 134* 136 139 140 140 142  K 5.7* 5.4* 4.9 4.2 3.6 3.3* 3.4*  CL 106 104 102 103 104 105 106  CO2 18* 19* 21* 21* 21* 20* 22  GLUCOSE 107* 188* 140* 130* 105* 101* 126*  BUN 52* 54* 64* 77* 89* 100* 108*  CREATININE 6.97* 7.11* 7.36* 7.28* 7.24* 6.95* 6.71*  CALCIUM 8.7* 8.7* 8.6* 8.5* 8.3* 8.0* 8.1*  PHOS 4.7* 4.7* 5.1* 6.8* 6.7* 5.6* 5.5*   CBC Recent Labs  Lab 05/04/20 0345 05/05/20 0453 05/06/20 0241 05/07/20 0236  WBC 18.2* 18.8* 20.6* 19.8*  HGB 7.3* 7.8* 7.8* 7.8*  HCT 23.0* 24.7* 24.3* 24.8*  MCV 91.3 90.8 91.4 91.5  PLT 722*  725* 719* 712*    Medications:    . bisacodyl  10 mg Rectal Once  . cyclophosphamide  100 mg Oral Daily  . darbepoetin (ARANESP) injection - NON-DIALYSIS  100 mcg Subcutaneous Q Fri-1800  . folic acid  1 mg Oral Daily  . linaclotide  290 mcg Oral QAC breakfast  . pantoprazole  40 mg Oral Daily  . predniSONE  60 mg Oral Q breakfast  . raloxifene  60 mg Oral Daily  . senna-docusate  2 tablet Oral BID  . sevelamer carbonate  800 mg Oral TID WC       Otelia Santee, MD 05/07/2020, 7:44 AM

## 2020-05-09 ENCOUNTER — Other Ambulatory Visit: Payer: Self-pay | Admitting: *Deleted

## 2020-05-09 NOTE — Patient Outreach (Signed)
[place] [facility]) Care Management  05/09/2020  Anabel Lykins Jobst 04-10-1946 [id number]  Receival Received 05/08/2020 Initial Outreach 05/09/2020 Primary provider to completed the Transition of care  Telephone Assessment-Successful-Enrollment (COPD)  RN spoke with pt today and introduced the [facility] services and purpose for today's call. Pt receptive as discussion surround her recent hospitalization. Pt verified she has spoken with her provider post-hospital follow-up. Pt states her pneumonia has resolved and she has a few new medications added to her list. Discussed COPD and her ongoing management of care. Educated on the COPD action plan and what to do if symptoms are acute. Offered to send out Comptroller and reviewed this information for pt's ongoing knowledge base.  Will discuss COPD medications on inhalers and pt's understanding of COPD action plan once reviewed. Will discuss her eating habits related to sufficient protein in her diet or a supplement to assist if needed. Will offer a disease management program for COPD for ongoing follow ups in assisting pt in managing this condition (pt receptive). Will discuss a generated plan of care with goals and interventions  To assist pt in managing this condition. Will offered to completed the initial assessment however pt request a call back next week to complete this task. Will alert pt's provider of her disposition with [facility] services at this time.  Plan: Will schedule outreach call next week for the initial assessment  [facility] CM Care Plan Problem One     Most Recent Value  Care Plan Problem One  Deficient Knowledge Related to COPD action plan  Role Documenting the Problem One  Care Management Telephonic Coordinator  Care Plan for Problem One  Active  [facility] Long Term Goal   Pt will verbalize the plan of action in the YELLOW zone within the next 90 days.  [facility] Long Term Goal Start Date  05/09/20  Interventions for Problem One Long  Term Goal  Will educate on the COPD action plan on each zone and teachback method concerning this information. Will aso verify pt is in the GREEN zone with no distressful breahing or related symptoms.  [facility] CM Short Term Goal #1   Adherence with all scheduled post op medical appointments within the next 30 days  [facility] CM Short Term Goal #1 Start Date  05/09/20  Interventions for Short Term Goal #1  Will strongly encourage pt to attend all scheduled medical appointments due to the risk for readmission to verify all medications and discharge papeework provided. Will verify pt has sufficient transportation and offer additional sources if needed.  [facility] CM Short Term Goal #2   Aderence with taking all prescribed medications within the next 30 days.  [facility] CM Short Term Goal #2 Start Date  05/09/20  Interventions for Short Term Goal #2  Will verify all discharge medications have been filled and review the medication list. Will verify pt's understanding on the purpose of all medications. Will stress the importance of taking all medications as prescribed.      [name], RN Care Management Coordinator [place] Office [phone]

## 2020-05-13 ENCOUNTER — Encounter: Payer: Self-pay | Admitting: Internal Medicine

## 2020-05-13 DIAGNOSIS — W19XXXA Unspecified fall, initial encounter: Secondary | ICD-10-CM | POA: Diagnosis not present

## 2020-05-13 DIAGNOSIS — R55 Syncope and collapse: Secondary | ICD-10-CM | POA: Diagnosis not present

## 2020-05-13 DIAGNOSIS — T679XXA Effect of heat and light, unspecified, initial encounter: Secondary | ICD-10-CM | POA: Diagnosis not present

## 2020-05-13 DIAGNOSIS — R197 Diarrhea, unspecified: Secondary | ICD-10-CM | POA: Diagnosis not present

## 2020-05-13 DIAGNOSIS — R42 Dizziness and giddiness: Secondary | ICD-10-CM | POA: Diagnosis not present

## 2020-05-15 ENCOUNTER — Other Ambulatory Visit: Payer: Self-pay | Admitting: *Deleted

## 2020-05-15 DIAGNOSIS — R7309 Other abnormal glucose: Secondary | ICD-10-CM | POA: Diagnosis not present

## 2020-05-15 DIAGNOSIS — K219 Gastro-esophageal reflux disease without esophagitis: Secondary | ICD-10-CM | POA: Diagnosis not present

## 2020-05-15 DIAGNOSIS — N017 Rapidly progressive nephritic syndrome with diffuse crescentic glomerulonephritis: Secondary | ICD-10-CM | POA: Diagnosis not present

## 2020-05-15 DIAGNOSIS — G8929 Other chronic pain: Secondary | ICD-10-CM | POA: Diagnosis not present

## 2020-05-15 DIAGNOSIS — Z6841 Body Mass Index (BMI) 40.0 and over, adult: Secondary | ICD-10-CM | POA: Diagnosis not present

## 2020-05-15 DIAGNOSIS — E7849 Other hyperlipidemia: Secondary | ICD-10-CM | POA: Diagnosis not present

## 2020-05-15 LAB — SURGICAL PATHOLOGY

## 2020-05-15 NOTE — Patient Outreach (Signed)
[place] [facility]) Care Management  05/15/2020  Cheryl [name] 10/08/1946 [id number]   Telephone Assessment  RN attempted outreach to complete the initial assessment however pt indicates she was on her way to an appointment with her primary provider. Pt requested a call back for tomorrow.  Plan: Will follow up tomorrow to completed the initial appointment.  [name], RN Care Management Coordinator [place] Office [phone]

## 2020-05-16 ENCOUNTER — Other Ambulatory Visit: Payer: Self-pay | Admitting: *Deleted

## 2020-05-16 NOTE — Patient Outreach (Signed)
[place] [facility]) Care Management  05/16/2020  Cheryl [name] [birth year] [id number]   Telephone Assessment-Unsuccessful  RN attempted outreach call today however unsuccessful and unable to leave a HIPAA approved voice message.  Plan: Will rescheduled another outreach call next week to complete the initial assessment.  [name], RN Care Management Coordinator [place] Office [phone]

## 2020-05-21 ENCOUNTER — Other Ambulatory Visit: Payer: Self-pay | Admitting: *Deleted

## 2020-05-21 ENCOUNTER — Encounter (HOSPITAL_COMMUNITY): Payer: Self-pay | Admitting: Nephrology

## 2020-05-21 NOTE — Patient Outreach (Signed)
[place] [facility]) Care Management  05/21/2020  JAHMIA BERRETT [birth year] [id number]   Telephone Assessment-Unsuccessful  RN attempted outreach call today however unsuccessful. RN was not able to leave a message.  Plan: Will send outreach letter and scheduled another outreach call next week to complete the initial assessment.  [name], RN Care Management Coordinator [place] Office [phone]

## 2020-05-26 DIAGNOSIS — E8770 Fluid overload, unspecified: Secondary | ICD-10-CM | POA: Diagnosis not present

## 2020-05-26 DIAGNOSIS — Z79899 Other long term (current) drug therapy: Secondary | ICD-10-CM | POA: Diagnosis not present

## 2020-05-26 DIAGNOSIS — N179 Acute kidney failure, unspecified: Secondary | ICD-10-CM | POA: Diagnosis not present

## 2020-05-26 DIAGNOSIS — N057 Unspecified nephritic syndrome with diffuse crescentic glomerulonephritis: Secondary | ICD-10-CM | POA: Diagnosis not present

## 2020-05-26 DIAGNOSIS — D631 Anemia in chronic kidney disease: Secondary | ICD-10-CM | POA: Diagnosis not present

## 2020-05-26 DIAGNOSIS — D84821 Immunodeficiency due to drugs: Secondary | ICD-10-CM | POA: Diagnosis not present

## 2020-05-26 DIAGNOSIS — N058 Unspecified nephritic syndrome with other morphologic changes: Secondary | ICD-10-CM | POA: Diagnosis not present

## 2020-05-26 DIAGNOSIS — N189 Chronic kidney disease, unspecified: Secondary | ICD-10-CM | POA: Diagnosis not present

## 2020-05-26 DIAGNOSIS — K219 Gastro-esophageal reflux disease without esophagitis: Secondary | ICD-10-CM | POA: Diagnosis not present

## 2020-05-26 DIAGNOSIS — I129 Hypertensive chronic kidney disease with stage 1 through stage 4 chronic kidney disease, or unspecified chronic kidney disease: Secondary | ICD-10-CM | POA: Diagnosis not present

## 2020-05-27 ENCOUNTER — Other Ambulatory Visit (HOSPITAL_COMMUNITY): Payer: Self-pay | Admitting: Nephrology

## 2020-05-29 ENCOUNTER — Other Ambulatory Visit: Payer: Self-pay | Admitting: *Deleted

## 2020-05-29 NOTE — Patient Outreach (Signed)
[place] [facility]) Care Management  05/29/2020  Cheryl [name] [birth year] [id number]  Case Closure(unable to contact)  Three unsuccessful outreach calls to obtain the initial assessment and no response to the outreach letter or calls. Unable to leave a HIPAA voice message on today's attempt.  Plan: Will close case based upon the workflow and allowance of 10 days with no response from the pt.  Cheryl Mina, RN Care Management Coordinator [place] Office [phone]

## 2020-05-30 DIAGNOSIS — E875 Hyperkalemia: Secondary | ICD-10-CM | POA: Diagnosis not present

## 2020-06-03 ENCOUNTER — Other Ambulatory Visit (HOSPITAL_COMMUNITY): Payer: Self-pay | Admitting: *Deleted

## 2020-06-03 NOTE — Discharge Instructions (Signed)

## 2020-06-04 ENCOUNTER — Other Ambulatory Visit: Payer: Self-pay

## 2020-06-04 ENCOUNTER — Ambulatory Visit (HOSPITAL_COMMUNITY)
Admission: RE | Admit: 2020-06-04 | Discharge: 2020-06-04 | Disposition: A | Discharge status: Home / Self Care | Payer: Medicare Other | Source: Ambulatory Visit | Attending: Nephrology | Admitting: Nephrology

## 2020-06-04 DIAGNOSIS — D631 Anemia in chronic kidney disease: Secondary | ICD-10-CM | POA: Insufficient documentation

## 2020-06-04 DIAGNOSIS — N189 Chronic kidney disease, unspecified: Secondary | ICD-10-CM | POA: Diagnosis present

## 2020-06-04 LAB — POCT HEMOGLOBIN-HEMACUE: Hemoglobin: 7 g/dL — ABNORMAL LOW (ref 12.0–15.0)

## 2020-06-04 MED ORDER — EPOETIN ALFA-EPBX 10000 UNIT/ML IJ SOLN: 10000.0000 [IU] | INTRAMUSCULAR | Status: DC

## 2020-06-04 MED ORDER — EPOETIN ALFA-EPBX 10000 UNIT/ML IJ SOLN
INTRAMUSCULAR | Status: AC
  Filled 2020-06-04: qty 2

## 2020-06-04 MED ORDER — EPOETIN ALFA-EPBX 10000 UNIT/ML IJ SOLN
20000.0000 [IU] | INTRAMUSCULAR | Status: DC
  Administered 2020-06-04: 20000 [IU] via SUBCUTANEOUS

## 2020-06-04 NOTE — Progress Notes (Signed)
Spoke with Stacy at [facility] about HGB 7.0.  Patient denies any bleeding , dark stools etc. Marzetta Board stated that Dr. Royce Macadamia would like to increase dose to 20,000 units Q 2 WEEKS STARTING TODAY  And they will fax over new order.

## 2020-06-09 MED FILL — Epoetin Alfa-epbx Inj 10000 Unit/ML: INTRAMUSCULAR | Qty: 2 | Status: AC

## 2020-06-12 ENCOUNTER — Ambulatory Visit: Payer: Medicare Other | Admitting: Internal Medicine

## 2020-06-13 MED FILL — METOPROLOL SUCCINATE ER 50: 50 | 30 days supply | Qty: 30 | Fill #0

## 2020-06-18 ENCOUNTER — Ambulatory Visit (HOSPITAL_COMMUNITY)
Admission: RE | Admit: 2020-06-18 | Discharge: 2020-06-18 | Disposition: A | Discharge status: Home / Self Care | Payer: Medicare Other | Source: Ambulatory Visit | Attending: Nephrology | Admitting: Nephrology

## 2020-06-18 ENCOUNTER — Other Ambulatory Visit: Payer: Self-pay

## 2020-06-18 VITALS — BP 128/63 | HR 83 | Temp 97.5°F

## 2020-06-18 DIAGNOSIS — N189 Chronic kidney disease, unspecified: Secondary | ICD-10-CM | POA: Diagnosis present

## 2020-06-18 DIAGNOSIS — D631 Anemia in chronic kidney disease: Secondary | ICD-10-CM | POA: Insufficient documentation

## 2020-06-18 LAB — POCT HEMOGLOBIN-HEMACUE: Hemoglobin: 8 g/dL — ABNORMAL LOW (ref 12.0–15.0)

## 2020-06-18 LAB — IRON AND TIBC
Iron: 131 ug/dL (ref 28–170)
Saturation Ratios: 47 % — ABNORMAL HIGH (ref 10.4–31.8)
TIBC: 280 ug/dL (ref 250–450)
UIBC: 149 ug/dL

## 2020-06-18 LAB — FERRITIN: Ferritin: 2623 ng/mL — ABNORMAL HIGH (ref 11–307)

## 2020-06-18 MED ORDER — EPOETIN ALFA-EPBX 10000 UNIT/ML IJ SOLN
INTRAMUSCULAR | Status: AC
  Administered 2020-06-18: 20000 [IU] via SUBCUTANEOUS
  Filled 2020-06-18: qty 2

## 2020-06-18 MED ORDER — EPOETIN ALFA-EPBX 10000 UNIT/ML IJ SOLN: 20000.0000 [IU] | INTRAMUSCULAR | Status: DC

## 2020-06-19 ENCOUNTER — Encounter: Payer: Self-pay | Admitting: Gastroenterology

## 2020-06-19 NOTE — Progress Notes (Signed)
Referring Provider: Ginger Organ Primary Care Physician:  Sharilyn Sites, MD Primary GI Physician: Dr. Gala Romney  Chief Complaint  Patient presents with  . Abdominal Pain    iron taste in mouth, alot of saliva, nausea, sour stomach    HPI:   Cheryl [name] is a 74 y.o. female presenting today with a history of GERD, PUD in 2017, constipation, adenomatous colon polyps. Last colonoscopy 08/05/2015 with melanosis coli, colonic diverticulosis with recommendations to repeat in 5 years (2021). Last EGD August 2017 with normal esophagus, previous gastric ulcer completely healed, small hiatal hernia, normal examined duodenum. She presents today for abdominal pain at the request of Ginger Organ   Last seen in our office March 2020 doing well with Linzess 290 mcg for constipation, occasionally using MiraLAX to augment constipation treatment. Reported a bad episode of constipation 1 week ago which flared hemorrhoids. GERD well controlled on protonix daily. Follow-up as needed.    Patient admitted 04/29/2020-05/07/2020 for acute renal failure 2/2 ANCA MPO Pauci-immune necrotizing glomerulonephritis. She presented with persistent and gradually worsening difficulty breathing over the last month since prior hospitalization in April for multifocal pneumonia with acute hypoxic respiratory failure treated with ceftriaxone and azithromycin, discharged on Omnicef and azithromycin. Also reported generalized abdominal queasiness over the last 1 to 2 weeks. She saw her PCP and was referred to the ED for elevated creatinine. She was found to have creatinine of 7.11 in the ED, hemoglobin 9.5. Completed 3 day course solumedrol 1g, transitioned to prednisone 60mg  PO daily, taper per nephrology, continue cytoxan 100mg  PO daily, avoid nephrotoxins. Regarding anemia, iron panel was checked with ferritin 425, iron 17, saturation 7%, TIBC 239, folate 5.3, B12 569, reticulocyte count 1.4. She received Feraheme IV  5/12 and 5/16. Hemoglobin 7.8 on discharge. Cr at discharge 6.71.   Received two Retacrit injections since hospitalization. Hemoglobin 8.0 06/18/2020. Iron 131, saturation 47%, ferritin 2623.  Today:  Has a sour/nauseated feeling. Generalized upset stomach sensation. No regular abdominal pain. Occasional upper abdominal discomfort maybe once a week. Overall nausea/sour stomach Improving since hospitalization. Had been daily. Now occurring 2-3 times a week. No identified triggers.  Sometimes can't sleep because of it. Eating doesn't make it worse. Drinks milk which helps. Taking protonix daily for GERD which keeps typical GERD symptoms well controlled. If she has a sour stomach, she takes a second Protonix and this helps.    Prednisone since hospitalization. She is tapering, currently on 30 mg daily. Seeing nephrology, [facility], Dr. Royce Macadamia.  States things are improving. Not sure what her kidney function is now. Not sure how long she will be on prednisone.     No blood in the stool or black stools. No NSAIDs.  Taking Linzess 290 mcg which is working well. Doesn't take it if she is going somewhere because she will have several BMs. Stools are soft/formed. She is happy with Linzess. Will use MiraLAX if needed.   Prefers to hold off on any procedures for a little longer while she is working with the nephrologists on her kidney function.   Denies fever or chills.  Admits to occasional lightheadedness for years with no change.  Denies presyncope or syncope.  Denies chest pain or heart palpitations.  Some SOB sinvce hospitalization felt to be related kidneys.     Past Medical History:  Diagnosis Date  . Acid reflux   . Arthritis   . CKD (chronic kidney disease)   . Constipation   .  GERD (gastroesophageal reflux disease)   . Neuromuscular disorder ([place])   . PUD (peptic ulcer disease) 2017   gastric ulcer healed on repeat EGD in August 2017  . Sinus drainage   . Sleep apnea    had surgery  to correct    Past Surgical History:  Procedure Laterality Date  . ANTERIOR CERVICAL DECOMP/DISCECTOMY FUSION N/A 11/19/2016   Procedure: ANTERIOR CERVICAL DISCECTOMY AND FUSION C5-6 WITH PLATES, SCREWS, CAGE, VIVIGEN II;  Surgeon: Jessy Oto, MD;  Location: [place];  Service: Orthopedics;  Laterality: N/A;  . CARPAL TUNNEL RELEASE    . CATARACT EXTRACTION, BILATERAL Bilateral 2017   One in December 2017 and one in January 2018  . COLONOSCOPY  02/15/2005   MPN:TIRWE small polyps ablated via cold biopsy, one from transverse colon and two from the rectum/Small external hemorrhoids  . COLONOSCOPY  07/10/2010   RXV:[id number] rectum/long redundant colon, polyps in the sigmoid, descending, hepatic flexure/ADENOMATOUS POLYPS. next TCS due 06/2015  . COLONOSCOPY  1995   3 polyps, path revealed chronic colitis  . COLONOSCOPY N/A 08/05/2015   Procedure: COLONOSCOPY;  Surgeon: Daneil Dolin, MD; melanosis coli, colonic diverticulosis. Repeat colonoscopy in 5 years for surveillance.  . ESOPHAGOGASTRODUODENOSCOPY  1995   Gastritis  . ESOPHAGOGASTRODUODENOSCOPY N/A 04/16/2013   RMR: HH  . ESOPHAGOGASTRODUODENOSCOPY N/A 05/13/2016   Procedure: ESOPHAGOGASTRODUODENOSCOPY (EGD);  Surgeon: Daneil Dolin, MD; gastric ulcer and erosions s/p biopsy, erosive gastropathy. Path of reactive and regenerative changes, no H. pylori.  . ESOPHAGOGASTRODUODENOSCOPY N/A 08/18/2016   Procedure: ESOPHAGOGASTRODUODENOSCOPY (EGD);  Surgeon: Daneil Dolin, MD; normal esophagus, previous gastric ulcer completely healed, small hiatal hernia, normal duodenum.  Marland Kitchen KNEE SURGERY     x2  . ROTATOR CUFF REPAIR     x2  . SHOULDER ARTHROSCOPY WITH ROTATOR CUFF REPAIR AND SUBACROMIAL DECOMPRESSION Right 01/21/2015   Procedure: RIGHT SHOULDER ARTHROSCOPIC DEBRIDEMNT OF G-H JOINT AND REMOVAL OF LOOSE BODIES,ARTHROSCOPIC SUBACROMIAL DECOMPRESSION,MINI OPEN RCT REPAIR WITH SUPPLEMENTAL [facility] PATCH;  Surgeon: Garald Balding, MD;  Location:  [place];  Service: Orthopedics;  Laterality: Right;  . TONSILLECTOMY    . TRIGGER FINGER RELEASE    . UVULOPALATOPHARYNGOPLASTY      Current Outpatient Medications  Medication Sig Dispense Refill  . acetaminophen (TYLENOL) 500 MG tablet Take 500-1,000 mg by mouth as needed (for pain/headache).     Marland Kitchen albuterol (VENTOLIN HFA) 108 (90 Base) MCG/ACT inhaler Inhale 2 puffs into the lungs every 6 (six) hours as needed for wheezing or shortness of breath. 18 g 2  . buPROPion (WELLBUTRIN XL) 300 MG 24 hr tablet Take 300 mg by mouth daily.    Marland Kitchen CALCIUM-VITAMIN D PO Take 5 mLs by mouth every morning.     . Cholecalciferol (VITAMIN D3) 125 MCG (5000 UT) CAPS Take 1 capsule by mouth daily.    . cyclophosphamide (CYTOXAN) 25 MG capsule Take by mouth daily. Takes 3 tablets by mouth daily.    . diclofenac sodium (VOLTAREN) 1 % GEL Apply 2-4 g topically 4 (four) times daily. (Patient taking differently: Apply 2-4 g topically 4 (four) times daily as needed (for pain). ) 5 Tube 3  . furosemide (LASIX) 80 MG tablet 3 (three) times a week.    Marland Kitchen guaiFENesin (MUCINEX) 600 MG 12 hr tablet Take 1 tablet (600 mg total) by mouth 2 (two) times daily. (Patient taking differently: Take 600 mg by mouth 2 (two) times daily as needed for cough or to loosen phlegm. ) 20 tablet 0  .  linaclotide (LINZESS) 290 MCG CAPS capsule Take 1 capsule (290 mcg total) by mouth daily before breakfast. 90 capsule 3  . montelukast (SINGULAIR) 10 MG tablet Take 1 tablet (10 mg total) by mouth every morning. 30 tablet 2  . nystatin (MYCOSTATIN) 100000 UNIT/ML suspension Take 5 mLs by mouth 2 (two) times daily as needed (for thrush).     . ondansetron (ZOFRAN) 4 MG tablet Take 4 mg by mouth as needed for nausea or vomiting.     . polyethylene glycol (MIRALAX / GLYCOLAX) packet Take 17 g by mouth daily as needed for mild constipation or moderate constipation.     . predniSONE (DELTASONE) 20 MG tablet Take 30 mg by mouth daily.    . raloxifene  (EVISTA) 60 MG tablet Take 60 mg by mouth daily.    Marland Kitchen rOPINIRole (REQUIP) 0.25 MG tablet Take 0.75 mg by mouth daily as needed (for rls).     . tolterodine (DETROL) 1 MG tablet Take 1 mg by mouth 2 (two) times daily.    . vitamin B-12 (CYANOCOBALAMIN) 1000 MCG tablet Take 1,000 mcg by mouth daily.    . pantoprazole (PROTONIX) 40 MG tablet Take 1 tablet (40 mg total) by mouth 2 (two) times daily before a meal. 60 tablet 3   No current facility-administered medications for this visit.    Allergies as of 06/20/2020 - Review Complete 06/20/2020  Allergen Reaction Noted  . Prednisone Other (See Comments) 04/04/2013  . Tramadol Nausea Only 01/21/2015  . Aspirin Nausea And Vomiting   . Celecoxib Rash     Family History  Problem Relation Age of Onset  . Cancer Mother   . Cancer Father   . Diabetes Sister   . Colon cancer Neg Hx   . Liver disease Neg Hx     Social History   Socioeconomic History  . Marital status: Married    Spouse name: Not on file  . Number of children: 2  . Years of education: Not on file  . Highest education level: Not on file  Occupational History  . Occupation: BUS DRIVER    Employer: [employer]  Tobacco Use  . Smoking status: Former Smoker    Packs/day: 0.10    Years: 48.00    Pack years: 4.80    Types: Cigarettes    Quit date: 05/11/2005    Years since quitting: 15.1  . Smokeless tobacco: Never Used  Substance and Sexual Activity  . Alcohol use: No    Alcohol/week: 0.0 standard drinks  . Drug use: No  . Sexual activity: Not on file  Other Topics Concern  . Not on file  Social History Narrative  . Not on file   Social Determinants of Health   Financial Resource Strain:   . Difficulty of Paying Living Expenses:   Food Insecurity:   . Worried About Charity fundraiser in the Last Year:   . Arboriculturist in the Last Year:   Transportation Needs:   . Film/video editor (Medical):   Marland Kitchen Lack of Transportation (Non-Medical):     Physical Activity:   . Days of Exercise per Week:   . Minutes of Exercise per Session:   Stress:   . Feeling of Stress :   Social Connections:   . Frequency of Communication with Friends and Family:   . Frequency of Social Gatherings with Friends and Family:   . Attends Religious Services:   . Active Member of Clubs or Organizations:   .  Attends Archivist Meetings:   Marland Kitchen Marital Status:     Review of Systems: Gen: See HPI CV: See HPI Resp: Denies cough. GI: See HPI Heme: See HPI  Physical Exam: BP (!) 144/81   Pulse 67   Temp (!) 97.4 F (36.3 C) (Oral)   Ht 4\' 11"  (1.499 m)   Wt 201 lb 6.4 oz (91.4 kg)   BMI 40.68 kg/m  General:   Alert and oriented. No distress noted. Pleasant and cooperative.  Head:  Normocephalic and atraumatic. Eyes:  Conjuctiva clear without scleral icterus. Heart:  S1, S2 present without murmurs appreciated. Lungs:  Clear to auscultation bilaterally. No wheezes, rales, or rhonchi. No distress.  Abdomen:  +BS, soft, non-tender and non-distended. No rebound or guarding. No HSM or masses noted. Msk:  Symmetrical without gross deformities. Normal posture. Extremities:  2+ LE edema in feet/ankles, 1+ pitting edema up to mid shin.  Neurologic:  Alert and  oriented x4 Psych:  Normal mood and affect.

## 2020-06-20 ENCOUNTER — Other Ambulatory Visit: Payer: Self-pay

## 2020-06-20 ENCOUNTER — Encounter: Payer: Self-pay | Admitting: Gastroenterology

## 2020-06-20 ENCOUNTER — Ambulatory Visit (INDEPENDENT_AMBULATORY_CARE_PROVIDER_SITE_OTHER): Payer: Medicare Other | Admitting: Gastroenterology

## 2020-06-20 VITALS — BP 144/81 | HR 67 | Temp 97.4°F | Ht 59.0 in | Wt 201.4 lb

## 2020-06-20 DIAGNOSIS — K219 Gastro-esophageal reflux disease without esophagitis: Secondary | ICD-10-CM

## 2020-06-20 DIAGNOSIS — R110 Nausea: Secondary | ICD-10-CM

## 2020-06-20 DIAGNOSIS — Z86010 Personal history of colonic polyps: Secondary | ICD-10-CM | POA: Diagnosis not present

## 2020-06-20 DIAGNOSIS — K5900 Constipation, unspecified: Secondary | ICD-10-CM

## 2020-06-20 DIAGNOSIS — D649 Anemia, unspecified: Secondary | ICD-10-CM | POA: Insufficient documentation

## 2020-06-20 MED ORDER — PANTOPRAZOLE SODIUM 40 MG PO TBEC: 40.0000 mg | DELAYED_RELEASE_TABLET | Freq: Two times a day (BID) | ORAL | 3 refills | Status: DC

## 2020-06-20 NOTE — Assessment & Plan Note (Addendum)
Acute on chronic normocytic anemia noted during hospitalization in May 2021 for which she was diagnosed with ANCA MPO Pauci-immune necrotizing glomerulonephritis (history of CKD but Cr bumped to 7.11 at time of admission).  Hemoglobin on admission 9.5.  Baseline appears to be in the 10-11 range at least since 2017.  Hemoglobin drifted down to 7.3, 7.8 at discharge.  No overt GI bleeding. Ferritin 425, iron 17, saturation 7%, TIBC 239, folate 5.3, B12 569.  She received IV Feraheme x2 and folate while inpatient. She has been following closely with [facility] who is treating her with prednisone.  She has received 2 Retacrit injections since hospitalization.  Labs 06/18/2020 with hemoglobin 8.0, Iron 131, saturation 47%, ferritin 2623.   Overall, suspect anemia is multifactorial and likley secondary to kidney disease/inflammation/low folate. Can't rule out possible GI source. She reports nausea/sour stomach since hospitalzation that is improving. History of PUD in 2017, but I suspect gastritis influenced by prednisone/secondary to kidney issues, doubt PUD. No lower GI issues. Due for surveillance TCS in August due to history of adenomatous colon polyps.  Patient prefers to hold off on any GI procedures at this time until her kidney function stabilizes.  She was advised to increase to Protonix 40 mg twice daily to help with nausea, monitor for BRBPR or melena, and follow-up in 2 months.  At minimum, she will need colonoscopy in the near future.

## 2020-06-20 NOTE — Assessment & Plan Note (Addendum)
Chronic.  Typical GERD symptoms are well controlled on Protonix 40 mg daily.  However, patient reports sour/nauseated feeling 2-3 times a week which improves when taking a second Protonix or drinking milk as discussed below. Suspect patient may have gastritis possibly influenced by prednisone. Denies NSAIDs. I have advised she increase Protonix to 40 mg twice daily every day for now.  We will follow her in 2 months and see how she is doing. Advised to call if symptoms do not improve.

## 2020-06-20 NOTE — Assessment & Plan Note (Addendum)
Chronic.  Well-controlled on Linzess 290 mcg daily.  She will use MiraLAX as needed for breakthrough symptoms.  She is pleased with her current bowel regimen.  No alarm symptoms.  Notably, she is due for repeat colonoscopy at this time due to history of adenomatous colon polyps as discussed above.  Also, recently hospitalized for ANCA MPO Pauci-immune necrotizing glomerulonephritis and found to have acute on chronic anemia with no overt GI bleeding. Ferritin 425, iron 17, saturation 7%, TIBC 239 .  Most suspicious that anemia is related to kidney disease/inflammation/low folate but cannot rule out contributing GI source.  Patient prefers to hold off on scheduling procedures for now until kidney function becomes more stable.  We will see her back in 2 months.  She will continue Linzess 290 mcg daily and MiraLAX as needed.

## 2020-06-20 NOTE — Patient Instructions (Signed)
Please increase Protonix to 40 mg twice daily 30 minutes before breakfast and dinner.  Continue Linzess 290 mcg daily.  Continue MiraLAX as needed.  As we discussed, you are due for a colonoscopy this year.  At your request, we will hold off on scheduling this for now.  We can discuss this at your next visit.  Monitor for any bright red blood per rectum or black stools and let us know immediately.  Plan to follow up in 2 months with Walden Field, NP.  Do not hesitate to call with questions or concerns prior.  Aliene Altes, PA-C [facility]

## 2020-06-20 NOTE — Assessment & Plan Note (Addendum)
Patient reports sour/nauseated feeling 2-3 times a week which improves when taking a second Protonix or drinking milk.  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  She has been on prednisone since hospitalization, currently on 30 mg daily.  Denies NSAIDs.  Suspect patient may have developed gastritis influenced by prednisone/ nausea secondary to kidney issues, doubt PUD. She does have history of PUD in 2017. Now with acute on chronic anemia discussed below which I suspect is secondary to kidney disease/inflammation/low folate. Patient has requested to hold off on any procedures for now.  I have advised she increase Protonix to 40 mg twice daily every day for now.  We will follow her in 2 months and see how she is doing. Advised to call if symptoms do not improve.

## 2020-06-20 NOTE — Assessment & Plan Note (Addendum)
Currently due for surveillance colonoscopy.  Last colonoscopy in August 2016 with melanosis coli, colonic diverticulosis.  Recommendations to repeat in 5 years due to history of adenomatous colon polyps.  No significant lower GI symptoms.  No BRBPR or melena.  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  Also with acute on chronic anemia without overt GI bleeding.  Iron panel with ferritin 425, iron 17, saturation 7%, TIBC 239.  Most suspicious that worsening anemia was secondary to kidney disease/inflammation/low folate, cannot rule out contributing GI source.  Patient prefers to hold off on procedures for now until her kidneys become more stable.  We will plan to see her back in 2 months to follow-up on nausea as discussed above.  May be able to schedule procedure at that time.

## 2020-06-24 ENCOUNTER — Encounter: Payer: Self-pay | Admitting: Internal Medicine

## 2020-06-26 ENCOUNTER — Encounter (INDEPENDENT_AMBULATORY_CARE_PROVIDER_SITE_OTHER): Payer: Medicare Other | Admitting: Ophthalmology

## 2020-07-01 ENCOUNTER — Other Ambulatory Visit: Payer: Self-pay

## 2020-07-01 ENCOUNTER — Encounter (INDEPENDENT_AMBULATORY_CARE_PROVIDER_SITE_OTHER): Payer: Self-pay | Admitting: Ophthalmology

## 2020-07-01 ENCOUNTER — Ambulatory Visit (INDEPENDENT_AMBULATORY_CARE_PROVIDER_SITE_OTHER): Payer: Medicare Other | Admitting: Ophthalmology

## 2020-07-01 DIAGNOSIS — H35712 Central serous chorioretinopathy, left eye: Secondary | ICD-10-CM

## 2020-07-01 NOTE — Progress Notes (Signed)
07/01/2020     CHIEF COMPLAINT Patient presents for Retina Evaluation   HISTORY OF PRESENT ILLNESS: Cheryl [name] is a 74 y.o. female who presents to the clinic today for:   HPI    Retina Evaluation    In both eyes.  This started 1 week ago.  Duration of 1 week.  Associated Symptoms Blind Spot and Pain.  Negative for Flashes and Floaters.  Context:  reading and watching TV.          Comments    Retinal evaluation per Dr. Katy Fitch for Striking macula edema with PED OS and VMT OD with dilated exam and OCT(MAC) today.  Pt states her vision has gotten extremely blurry and she is seeing a black spot OS.        Last edited by Melburn Popper, COA on 07/01/2020  3:08 PM. (History)      Referring physician: Sharilyn Sites, MD [address] [place],  [place] [address]  HISTORICAL INFORMATION:   Selected notes from the [facility]: No current outpatient medications on file. (Ophthalmic Drugs)   No current facility-administered medications for this visit. (Ophthalmic Drugs)   Current Outpatient Medications (Other)  Medication Sig  . acetaminophen (TYLENOL) 500 MG tablet Take 500-1,000 mg by mouth as needed (for pain/headache).   Marland Kitchen albuterol (VENTOLIN HFA) 108 (90 Base) MCG/ACT inhaler Inhale 2 puffs into the lungs every 6 (six) hours as needed for wheezing or shortness of breath.  Marland Kitchen buPROPion (WELLBUTRIN XL) 300 MG 24 hr tablet Take 300 mg by mouth daily.  Marland Kitchen CALCIUM-VITAMIN D PO Take 5 mLs by mouth every morning.   . Cholecalciferol (VITAMIN D3) 125 MCG (5000 UT) CAPS Take 1 capsule by mouth daily.  . cyclophosphamide (CYTOXAN) 25 MG capsule Take by mouth daily. Takes 3 tablets by mouth daily.  . diclofenac sodium (VOLTAREN) 1 % GEL Apply 2-4 g topically 4 (four) times daily. (Patient taking differently: Apply 2-4 g topically 4 (four) times daily as needed (for pain). )  . furosemide (LASIX) 80 MG tablet 3 (three) times a week.  Marland Kitchen  guaiFENesin (MUCINEX) 600 MG 12 hr tablet Take 1 tablet (600 mg total) by mouth 2 (two) times daily. (Patient taking differently: Take 600 mg by mouth 2 (two) times daily as needed for cough or to loosen phlegm. )  . linaclotide (LINZESS) 290 MCG CAPS capsule Take 1 capsule (290 mcg total) by mouth daily before breakfast.  . montelukast (SINGULAIR) 10 MG tablet Take 1 tablet (10 mg total) by mouth every morning.  . nystatin (MYCOSTATIN) 100000 UNIT/ML suspension Take 5 mLs by mouth 2 (two) times daily as needed (for thrush).   . ondansetron (ZOFRAN) 4 MG tablet Take 4 mg by mouth as needed for nausea or vomiting.   . pantoprazole (PROTONIX) 40 MG tablet Take 1 tablet (40 mg total) by mouth 2 (two) times daily before a meal.  . polyethylene glycol (MIRALAX / GLYCOLAX) packet Take 17 g by mouth daily as needed for mild constipation or moderate constipation.   . predniSONE (DELTASONE) 20 MG tablet Take 30 mg by mouth daily.  . raloxifene (EVISTA) 60 MG tablet Take 60 mg by mouth daily.  Marland Kitchen rOPINIRole (REQUIP) 0.25 MG tablet Take 0.75 mg by mouth daily as needed (for rls).   . tolterodine (DETROL) 1 MG tablet Take 1 mg by mouth 2 (two) times daily.  . vitamin B-12 (CYANOCOBALAMIN) 1000 MCG tablet Take  1,000 mcg by mouth daily.   No current facility-administered medications for this visit. (Other)      REVIEW OF SYSTEMS:    ALLERGIES Allergies  Allergen Reactions  . Prednisone Other (See Comments)    Hallucinations   . Tramadol Nausea Only  . Aspirin Nausea And Vomiting  . Celecoxib Rash    PAST MEDICAL HISTORY Past Medical History:  Diagnosis Date  . Acid reflux   . Arthritis   . CKD (chronic kidney disease)   . Constipation   . GERD (gastroesophageal reflux disease)   . Neuromuscular disorder ([place])   . PUD (peptic ulcer disease) 2017   gastric ulcer healed on repeat EGD in August 2017  . Sinus drainage   . Sleep apnea    had surgery to correct   Past Surgical History:    Procedure Laterality Date  . ANTERIOR CERVICAL DECOMP/DISCECTOMY FUSION N/A 11/19/2016   Procedure: ANTERIOR CERVICAL DISCECTOMY AND FUSION C5-6 WITH PLATES, SCREWS, CAGE, VIVIGEN II;  Surgeon: Jessy Oto, MD;  Location: [facility];  Service: Orthopedics;  Laterality: N/A;  . CARPAL TUNNEL RELEASE    . CATARACT EXTRACTION, BILATERAL Bilateral 2017   One in December 2017 and one in January 2018  . COLONOSCOPY  02/15/2005   QIO:NGEXB small polyps ablated via cold biopsy, one from transverse colon and two from the rectum/Small external hemorrhoids  . COLONOSCOPY  07/10/2010   MWU:XLKGMW rectum/long redundant colon, polyps in the sigmoid, descending, hepatic flexure/ADENOMATOUS POLYPS. next TCS due 06/2015  . COLONOSCOPY  1995   3 polyps, path revealed chronic colitis  . COLONOSCOPY N/A 08/05/2015   Procedure: COLONOSCOPY;  Surgeon: Daneil Dolin, MD; melanosis coli, colonic diverticulosis. Repeat colonoscopy in 5 years for surveillance.  . ESOPHAGOGASTRODUODENOSCOPY  1995   Gastritis  . ESOPHAGOGASTRODUODENOSCOPY N/A 04/16/2013   RMR: [facility]  . ESOPHAGOGASTRODUODENOSCOPY N/A 05/13/2016   Procedure: ESOPHAGOGASTRODUODENOSCOPY (EGD);  Surgeon: Daneil Dolin, MD; gastric ulcer and erosions s/p biopsy, erosive gastropathy. Path of reactive and regenerative changes, no H. pylori.  . ESOPHAGOGASTRODUODENOSCOPY N/A 08/18/2016   Procedure: ESOPHAGOGASTRODUODENOSCOPY (EGD);  Surgeon: Daneil Dolin, MD; normal esophagus, previous gastric ulcer completely healed, small hiatal hernia, normal duodenum.  Marland Kitchen KNEE SURGERY     x2  . ROTATOR CUFF REPAIR     x2  . SHOULDER ARTHROSCOPY WITH ROTATOR CUFF REPAIR AND SUBACROMIAL DECOMPRESSION Right 01/21/2015   Procedure: RIGHT SHOULDER ARTHROSCOPIC DEBRIDEMNT OF G-H JOINT AND REMOVAL OF LOOSE BODIES,ARTHROSCOPIC SUBACROMIAL DECOMPRESSION,MINI OPEN RCT REPAIR WITH SUPPLEMENTAL [facility] PATCH;  Surgeon: Garald Balding, MD;  Location: [facility];  Service: Orthopedics;   Laterality: Right;  . TONSILLECTOMY    . TRIGGER FINGER RELEASE    . UVULOPALATOPHARYNGOPLASTY      FAMILY HISTORY Family History  Problem Relation Age of Onset  . Cancer Mother   . Cancer Father   . Diabetes Sister   . Colon cancer Neg Hx   . Liver disease Neg Hx     SOCIAL HISTORY Social History   Tobacco Use  . Smoking status: Former Smoker    Packs/day: 0.10    Years: 48.00    Pack years: 4.80    Types: Cigarettes    Quit date: 05/11/2005    Years since quitting: 15.1  . Smokeless tobacco: Never Used  Substance Use Topics  . Alcohol use: No    Alcohol/week: 0.0 standard drinks  . Drug use: No         OPHTHALMIC EXAM:  Base Eye Exam  Visual Acuity (ETDRS)      Right Left   Dist cc 20/20 20/40 -2   Dist ph cc  20/40   Correction: Glasses       Tonometry (Tonopen, 3:14 PM)      Right Left   Pressure 11 12       Pupils      Pupils Dark Light Shape React APD   Right PERRL 5 4 Round Sluggish None   Left PERRL 5 4 Round Sluggish None       Visual Fields (Counting fingers)      Left Right    Full Full       Extraocular Movement      Right Left    Full Full       Neuro/Psych    Oriented x3: Yes   Mood/Affect: Normal       Dilation    Both eyes: 1.0% Mydriacyl, 2.5% Phenylephrine @ 3:14 PM        Slit Lamp and Fundus Exam    External Exam      Right Left   External Normal Normal       Slit Lamp Exam      Right Left   Lids/Lashes Normal Normal   Conjunctiva/Sclera White and quiet White and quiet   Cornea Clear Clear   Anterior Chamber Deep and quiet Deep and quiet   Iris Round and reactive Round and reactive   Lens Posterior chamber intraocular lens Posterior chamber intraocular lens   Vitreous Normal Normal          IMAGING AND PROCEDURES  Imaging and Procedures for 07/01/20  OCT, Retina - OU - Both Eyes       Right Eye Quality was good. Scan locations included subfoveal. Central Foveal Thickness: 270. Progression  has no prior data. Findings include vitreomacular adhesion , abnormal foveal contour.   Left Eye Quality was good. Scan locations included subfoveal. Central Foveal Thickness: 408. Progression has no prior data. Findings include abnormal foveal contour, subretinal fluid, pigment epithelial detachment.   Notes Past with serous detachment seemingly emanating from a pigment epithelial detachment inferotemporal to the foveal region.  Need fluorescein angiography transit left eye to delineate as this CNVM or CSCR                ASSESSMENT/PLAN:  No problem-specific Assessment & Plan notes found for this encounter.      ICD-10-CM   1. 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  Differential diagnosis also includes exudative CNVM OS with posterior detachment although this large of a serous retinal detachment is unlikely.  2.  Worsening angiography attempted today on multiple occasions but with poor IV access we will reschedule.  Patient is on thrice weekly Lasix, thus we will have her return Monday morning prior to using her morning dose of Lasix and hopefully she will have a larger vascular volume and easier to visualize very small peripheral veins.  3.  I explained in some detail with the patient the variation in types of therapy.  May in fact need to treat as a trial for CNVM, if we do not obtain IV access and thus are unable to obtain the fundus fluorescein angiography.  We will not proceed at this time with therapy until  we can obtain fluorescein angiography  Ophthalmic Meds Ordered this visit:  No orders of the defined types were placed in this encounter.      Return in about 6 days (around 07/07/2020) for DILATE OU, COLOR FP, OPTOS FFA L/R.  There are no Patient Instructions on file for this  visit.   Explained the diagnoses, plan, and follow up with the patient and they expressed understanding.  Patient expressed understanding of the importance of proper follow up care.   Clent Demark Dorota Heinrichs M.D. Diseases & Surgery of the Retina and Vitreous Retina & Diabetic [place] 07/01/20     Abbreviations: M myopia (nearsighted); A astigmatism; H hyperopia (farsighted); P presbyopia; Mrx spectacle prescription;  CTL contact lenses; OD right eye; OS left eye; OU both eyes  XT exotropia; ET esotropia; PEK punctate epithelial keratitis; PEE punctate epithelial erosions; DES dry eye syndrome; MGD meibomian gland dysfunction; ATs artificial tears; PFAT's preservative free artificial tears; [place] nuclear sclerotic cataract; PSC posterior subcapsular cataract; ERM epi-retinal membrane; PVD posterior vitreous detachment; RD retinal detachment; DM diabetes mellitus; DR diabetic retinopathy; NPDR non-proliferative diabetic retinopathy; PDR proliferative diabetic retinopathy; CSME clinically significant macular edema; DME diabetic macular edema; dbh dot blot hemorrhages; CWS cotton wool spot; POAG primary open angle glaucoma; C/D cup-to-disc ratio; HVF humphrey visual field; GVF goldmann visual field; OCT optical coherence tomography; IOP intraocular pressure; BRVO Branch retinal vein occlusion; CRVO central retinal vein occlusion; CRAO central retinal artery occlusion; BRAO branch retinal artery occlusion; RT retinal tear; SB scleral buckle; PPV pars plana vitrectomy; VH Vitreous hemorrhage; PRP panretinal laser photocoagulation; IVK intravitreal kenalog; VMT vitreomacular traction; MH Macular hole;  NVD neovascularization of the disc; NVE neovascularization elsewhere; AREDS age related eye disease study; ARMD age related macular degeneration; POAG primary open angle glaucoma; EBMD epithelial/anterior basement membrane dystrophy; ACIOL anterior chamber intraocular lens; IOL intraocular lens; PCIOL posterior chamber  intraocular lens; Phaco/IOL phacoemulsification with intraocular lens placement; [place] photorefractive keratectomy; LASIK laser assisted in situ keratomileusis; HTN hypertension; DM diabetes mellitus; COPD chronic obstructive pulmonary disease

## 2020-07-02 ENCOUNTER — Ambulatory Visit (HOSPITAL_COMMUNITY)
Admission: RE | Admit: 2020-07-02 | Discharge: 2020-07-02 | Disposition: A | Discharge status: Home / Self Care | Payer: Medicare Other | Source: Ambulatory Visit | Attending: Nephrology | Admitting: Nephrology

## 2020-07-02 VITALS — BP 136/84 | HR 65 | Temp 97.4°F | Resp 20

## 2020-07-02 DIAGNOSIS — N189 Chronic kidney disease, unspecified: Secondary | ICD-10-CM

## 2020-07-02 DIAGNOSIS — D631 Anemia in chronic kidney disease: Secondary | ICD-10-CM | POA: Diagnosis not present

## 2020-07-02 LAB — POCT HEMOGLOBIN-HEMACUE: Hemoglobin: 10.4 g/dL — ABNORMAL LOW (ref 12.0–15.0)

## 2020-07-02 MED ORDER — EPOETIN ALFA-EPBX 10000 UNIT/ML IJ SOLN
INTRAMUSCULAR | Status: AC
  Administered 2020-07-02: 10000 [IU] via SUBCUTANEOUS
  Filled 2020-07-02: qty 1

## 2020-07-02 MED ORDER — EPOETIN ALFA-EPBX 10000 UNIT/ML IJ SOLN: 10000.0000 [IU] | INTRAMUSCULAR | Status: DC

## 2020-07-07 ENCOUNTER — Ambulatory Visit (INDEPENDENT_AMBULATORY_CARE_PROVIDER_SITE_OTHER): Payer: Medicare Other | Admitting: Ophthalmology

## 2020-07-07 ENCOUNTER — Encounter (INDEPENDENT_AMBULATORY_CARE_PROVIDER_SITE_OTHER): Payer: Self-pay | Admitting: Ophthalmology

## 2020-07-07 ENCOUNTER — Other Ambulatory Visit: Payer: Self-pay

## 2020-07-07 DIAGNOSIS — R799 Abnormal finding of blood chemistry, unspecified: Secondary | ICD-10-CM | POA: Diagnosis not present

## 2020-07-07 DIAGNOSIS — N179 Acute kidney failure, unspecified: Secondary | ICD-10-CM | POA: Diagnosis not present

## 2020-07-07 DIAGNOSIS — H35712 Central serous chorioretinopathy, left eye: Secondary | ICD-10-CM | POA: Diagnosis not present

## 2020-07-07 DIAGNOSIS — N058 Unspecified nephritic syndrome with other morphologic changes: Secondary | ICD-10-CM | POA: Diagnosis not present

## 2020-07-07 DIAGNOSIS — I129 Hypertensive chronic kidney disease with stage 1 through stage 4 chronic kidney disease, or unspecified chronic kidney disease: Secondary | ICD-10-CM | POA: Diagnosis not present

## 2020-07-07 NOTE — Assessment & Plan Note (Signed)
Central serous retinopathy OS with pigment epithelial detachment.  Patient is undergoing oral chemotherapy.  Patient does recall having particularly stressful events some 2 weeks previous which would could be coincident with the worsening of the condition last week.  Poor IV access does not allow to see the site of leakage.  Nonetheless I have reassured the patient that most of these conditions will spontaneously resolve usually within months but certainly in most cases less than 6 months.  Continue to observe as there is already a slight improvement today.

## 2020-07-07 NOTE — Patient Instructions (Signed)
Probably should the vision left eye decline

## 2020-07-07 NOTE — Progress Notes (Signed)
07/07/2020     CHIEF COMPLAINT Patient presents for Retina Follow Up   HISTORY OF PRESENT ILLNESS: Cheryl [name] is a 74 y.o. female who presents to the clinic today for:   HPI    Retina Follow Up    Patient presents with  Other.  In both eyes.  This started 1 week ago.  Severity is mild.  Duration of 1 week.  Since onset it is stable.          Comments    1 Week FP/FFA L/R  Pt denies noticeable changes to [place] OU since last visit. Pt denies ocular pain, flashes of light, or floaters OU.         Last edited by Rockie Neighbours, [place] on 07/07/2020  8:57 AM. (History)      Referring physician: Sharilyn Sites, MD [id number] [place],  [place] [address]  HISTORICAL INFORMATION:   Selected notes from the [place]: No current outpatient medications on file. (Ophthalmic Drugs)   No current facility-administered medications for this visit. (Ophthalmic Drugs)   Current Outpatient Medications (Other)  Medication Sig   acetaminophen (TYLENOL) 500 MG tablet Take 500-1,000 mg by mouth as needed (for pain/headache).    albuterol (VENTOLIN HFA) 108 (90 Base) MCG/ACT inhaler Inhale 2 puffs into the lungs every 6 (six) hours as needed for wheezing or shortness of breath.   buPROPion (WELLBUTRIN XL) 300 MG 24 hr tablet Take 300 mg by mouth daily.   CALCIUM-VITAMIN D PO Take 5 mLs by mouth every morning.    Cholecalciferol (VITAMIN D3) 125 MCG (5000 UT) CAPS Take 1 capsule by mouth daily.   cyclophosphamide (CYTOXAN) 25 MG capsule Take by mouth daily. Takes 3 tablets by mouth daily.   diclofenac sodium (VOLTAREN) 1 % GEL Apply 2-4 g topically 4 (four) times daily. (Patient taking differently: Apply 2-4 g topically 4 (four) times daily as needed (for pain). )   furosemide (LASIX) 80 MG tablet 3 (three) times a week.   guaiFENesin (MUCINEX) 600 MG 12 hr tablet Take 1 tablet (600 mg total) by mouth 2 (two) times daily. (Patient taking  differently: Take 600 mg by mouth 2 (two) times daily as needed for cough or to loosen phlegm. )   linaclotide (LINZESS) 290 MCG CAPS capsule Take 1 capsule (290 mcg total) by mouth daily before breakfast.   montelukast (SINGULAIR) 10 MG tablet Take 1 tablet (10 mg total) by mouth every morning.   nystatin (MYCOSTATIN) 100000 UNIT/ML suspension Take 5 mLs by mouth 2 (two) times daily as needed (for thrush).    ondansetron (ZOFRAN) 4 MG tablet Take 4 mg by mouth as needed for nausea or vomiting.    pantoprazole (PROTONIX) 40 MG tablet Take 1 tablet (40 mg total) by mouth 2 (two) times daily before a meal.   polyethylene glycol (MIRALAX / GLYCOLAX) packet Take 17 g by mouth daily as needed for mild constipation or moderate constipation.    predniSONE (DELTASONE) 20 MG tablet Take 30 mg by mouth daily.   raloxifene (EVISTA) 60 MG tablet Take 60 mg by mouth daily.   rOPINIRole (REQUIP) 0.25 MG tablet Take 0.75 mg by mouth daily as needed (for rls).    tolterodine (DETROL) 1 MG tablet Take 1 mg by mouth 2 (two) times daily.   vitamin B-12 (CYANOCOBALAMIN) 1000 MCG tablet Take 1,000 mcg by mouth daily.   No current facility-administered medications for this visit. (Other)  REVIEW OF SYSTEMS:    ALLERGIES Allergies  Allergen Reactions   Prednisone Other (See Comments)    Hallucinations    Tramadol Nausea Only   Aspirin Nausea And Vomiting   Celecoxib Rash    PAST MEDICAL HISTORY Past Medical History:  Diagnosis Date   Acid reflux    Arthritis    CKD (chronic kidney disease)    Constipation    GERD (gastroesophageal reflux disease)    Neuromuscular disorder (HCC)    PUD (peptic ulcer disease) 2017   gastric ulcer healed on repeat EGD in August 2017   Sinus drainage    Sleep apnea    had surgery to correct   Past Surgical History:  Procedure Laterality Date   ANTERIOR CERVICAL DECOMP/DISCECTOMY FUSION N/A 11/19/2016   Procedure: ANTERIOR CERVICAL  DISCECTOMY AND FUSION C5-6 WITH PLATES, SCREWS, CAGE, VIVIGEN II;  Surgeon: Jessy Oto, MD;  Location: [facility];  Service: Orthopedics;  Laterality: N/A;   CARPAL TUNNEL RELEASE     CATARACT EXTRACTION, BILATERAL Bilateral 2017   One in December 2017 and one in January 2018   COLONOSCOPY  02/15/2005   [id number] small polyps ablated via cold biopsy, one from transverse colon and two from the rectum/Small external hemorrhoids   COLONOSCOPY  07/10/2010   CHE:NIDPOE rectum/long redundant colon, polyps in the sigmoid, descending, hepatic flexure/ADENOMATOUS POLYPS. next TCS due 06/2015   COLONOSCOPY  1995   3 polyps, path revealed chronic colitis   COLONOSCOPY N/A 08/05/2015   Procedure: COLONOSCOPY;  Surgeon: Daneil Dolin, MD; melanosis coli, colonic diverticulosis. Repeat colonoscopy in 5 years for surveillance.   ESOPHAGOGASTRODUODENOSCOPY  1995   Gastritis   ESOPHAGOGASTRODUODENOSCOPY N/A 04/16/2013   RMR: HH   ESOPHAGOGASTRODUODENOSCOPY N/A 05/13/2016   Procedure: ESOPHAGOGASTRODUODENOSCOPY (EGD);  Surgeon: Daneil Dolin, MD; gastric ulcer and erosions s/p biopsy, erosive gastropathy. Path of reactive and regenerative changes, no H. pylori.   ESOPHAGOGASTRODUODENOSCOPY N/A 08/18/2016   Procedure: ESOPHAGOGASTRODUODENOSCOPY (EGD);  Surgeon: Daneil Dolin, MD; normal esophagus, previous gastric ulcer completely healed, small hiatal hernia, normal duodenum.   KNEE SURGERY     x2   ROTATOR CUFF REPAIR     x2   SHOULDER ARTHROSCOPY WITH ROTATOR CUFF REPAIR AND SUBACROMIAL DECOMPRESSION Right 01/21/2015   Procedure: RIGHT SHOULDER ARTHROSCOPIC DEBRIDEMNT OF G-H JOINT AND REMOVAL OF LOOSE BODIES,ARTHROSCOPIC SUBACROMIAL DECOMPRESSION,MINI OPEN RCT REPAIR WITH SUPPLEMENTAL [facility] PATCH;  Surgeon: Garald Balding, MD;  Location: [facility];  Service: Orthopedics;  Laterality: Right;   TONSILLECTOMY     TRIGGER FINGER RELEASE     UVULOPALATOPHARYNGOPLASTY      FAMILY HISTORY Family  History  Problem Relation Age of Onset   Cancer Mother    Cancer Father    Diabetes Sister    Colon cancer Neg Hx    Liver disease Neg Hx     SOCIAL HISTORY Social History   Tobacco Use   Smoking status: Former Smoker    Packs/day: 0.10    Years: 48.00    Pack years: 4.80    Types: Cigarettes    Quit date: 05/11/2005    Years since quitting: 15.1   Smokeless tobacco: Never Used  Substance Use Topics   Alcohol use: No    Alcohol/week: 0.0 standard drinks   Drug use: No         OPHTHALMIC EXAM:  Base Eye Exam    Visual Acuity (ETDRS)      Right Left   Dist cc 20/20 -1 20/40 +1  Dist ph cc  20/30 +2   Correction: Glasses       Tonometry (Tonopen, 9:00 AM)      Right Left   Pressure 21 20       Pupils      Pupils Dark Light Shape React APD   Right PERRL 5 4 Round Brisk None   Left PERRL 5 4 Round Brisk None       Visual Fields (Counting fingers)      Left Right    Full Full       Extraocular Movement      Right Left    Full Full       Neuro/Psych    Oriented x3: Yes   Mood/Affect: Normal       Dilation    Both eyes: 1.0% Mydriacyl, 2.5% Phenylephrine @ 9:01 AM        Slit Lamp and Fundus Exam    External Exam      Right Left   External Normal Normal       Slit Lamp Exam      Right Left   Lids/Lashes Normal Normal   Conjunctiva/Sclera White and quiet White and quiet   Cornea Clear Clear   Anterior Chamber Deep and quiet Deep and quiet   Iris Round and reactive Round and reactive   Lens Posterior chamber intraocular lens Posterior chamber intraocular lens   Anterior Vitreous Normal Normal       Fundus Exam      Right Left   Posterior Vitreous Normal Normal   Disc Normal Normal   C/D Ratio 0.45 0.5   Macula Normal Macular thickening, Retinal pigment epithelial detachment,, subretinal fluid inferior to the fovea.  Subretinal white fibrotic area corresponds with the RPE detachment noted on OCT temporal to the fovea.    Vessels Normal Normal   Periphery Normal Normal          IMAGING AND PROCEDURES  Imaging and Procedures for 07/07/20  Color Fundus Photography Optos - OU - Both Eyes       Right Eye Progression has been stable. Disc findings include normal observations. Macula : normal observations. Vessels : normal observations. Periphery : normal observations.   Left Eye Progression has improved. Disc findings include normal observations. Vessels : normal observations. Periphery : normal observations.   Notes OS with serous retinal detachment, nonturbid subretinal fluid.  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       Fluorescein Angiography Optos (Transit OS)       Poor IV access attempted on the second to try again on the right 1 on the right 1 on the left with out successful injection       OCT, Retina - OU - Both Eyes       Right Eye Quality was good. Scan locations included subfoveal. Central Foveal Thickness: 271. Progression has been stable. Findings include vitreomacular adhesion , normal foveal contour.   Left Eye Quality was good. Scan locations included subfoveal. Central Foveal Thickness: 359. Progression has improved. Findings include vitreomacular adhesion , vitreous traction, abnormal foveal contour, subretinal hyper-reflective material, pigment epithelial detachment.   Notes OS, slightly less subretinal fluid in the foveal region as well as inferiorly.  RPE detachment persist temporally.                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  Patient is undergoing oral chemotherapy.  Patient does recall having particularly stressful events some 2 weeks previous which would could be coincident with the worsening of the condition last week.  Poor IV  access does not allow to see the site of leakage.  Nonetheless I have reassured the patient that most of these conditions will spontaneously resolve usually within months but certainly in most cases less than 6 months.  Continue to observe as there is already a slight improvement today.      ICD-10-CM   1. 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  I reassured the patient that the left eye is simply, now out of focus due to the elevation of the retina yet in the short time period of 5 to 6days the amount of fluid has diminished.  I reassured her that this is likely to continue over the coming months but certainly within 6 months in most cases.  The fact that she required some steroid use, prednisone for her general medical conditions, should not be halted for the's eye condition.  Patient understands that this going a short pulse of steroid therapy.  That should help with further resolution of his eye condition.  2.  3.  Ophthalmic Meds Ordered this visit:  No orders of the defined types were placed in this encounter.      Return in about 6 weeks (around 08/18/2020) for OS, dilate, OCT.  Patient Instructions  Probably should the vision left eye decline    Explained the diagnoses, plan, and follow up with the patient and they expressed understanding.  Patient expressed understanding of the importance of proper follow up care.   Clent Demark Avyanna Spada M.D. Diseases & Surgery of the Retina and Vitreous [facility]ina [facility]abetic [place] 07/07/20     Abbreviations: M myopia (nearsighted); A astigmatism; H hyperopia (farsighted); P presbyopia; Mrx spectacle prescription;  CTL contact lenses; OD right eye; OS left eye; OU both eyes  XT exotropia; ET esotropia; PEK punctate epithelial keratitis; PEE punctate epithelial erosions; DES dry eye  syndrome; MGD meibomian gland dysfunction; ATs artificial tears; PFAT's preservative free artificial tears; [place] nuclear sclerotic cataract; PSC posterior subcapsular cataract; ERM epi-retinal membrane; PVD posterior vitreous detachment; RD retinal detachment; DM diabetes mellitus; DR diabetic retinopathy; NPDR non-proliferative diabetic retinopathy; PDR proliferative diabetic retinopathy; CSME clinically significant macular edema; DME diabetic macular edema; dbh dot blot hemorrhages; CWS cotton wool spot; POAG primary open angle glaucoma; C/D cup-to-disc ratio; HVF humphrey visual field; GVF goldmann visual field; OCT optical coherence tomography; IOP intraocular pressure; BRVO Branch retinal vein occlusion; CRVO central retinal vein occlusion; CRAO central retinal artery occlusion; BRAO branch retinal artery occlusion; RT retinal tear; SB scleral buckle; PPV pars plana vitrectomy; VH Vitreous hemorrhage; PRP panretinal laser photocoagulation; IVK intravitreal kenalog; VMT vitreomacular traction; MH Macular hole;  NVD neovascularization of the disc; NVE neovascularization elsewhere; AREDS age related eye disease study; ARMD age related macular degeneration; POAG primary open angle glaucoma; EBMD epithelial/anterior basement membrane dystrophy; ACIOL anterior chamber intraocular lens; IOL intraocular lens; PCIOL posterior chamber intraocular lens; Phaco/IOL phacoemulsification with intraocular lens placement; [place] photorefractive keratectomy; LASIK laser assisted in situ keratomileusis; HTN hypertension; DM diabetes mellitus; COPD chronic obstructive pulmonary disease

## 2020-07-10 DIAGNOSIS — E875 Hyperkalemia: Secondary | ICD-10-CM | POA: Diagnosis not present

## 2020-07-10 MED FILL — CYCLOPHOSPHAMIDE 25 MG CAPS: 25 | 30 days supply | Qty: 90 | Fill #1

## 2020-07-14 DIAGNOSIS — K219 Gastro-esophageal reflux disease without esophagitis: Secondary | ICD-10-CM | POA: Diagnosis not present

## 2020-07-14 DIAGNOSIS — E8770 Fluid overload, unspecified: Secondary | ICD-10-CM | POA: Diagnosis not present

## 2020-07-14 DIAGNOSIS — N057 Unspecified nephritic syndrome with diffuse crescentic glomerulonephritis: Secondary | ICD-10-CM | POA: Diagnosis not present

## 2020-07-14 DIAGNOSIS — N189 Chronic kidney disease, unspecified: Secondary | ICD-10-CM | POA: Diagnosis not present

## 2020-07-14 DIAGNOSIS — E875 Hyperkalemia: Secondary | ICD-10-CM | POA: Diagnosis not present

## 2020-07-14 DIAGNOSIS — H538 Other visual disturbances: Secondary | ICD-10-CM | POA: Diagnosis not present

## 2020-07-14 DIAGNOSIS — D631 Anemia in chronic kidney disease: Secondary | ICD-10-CM | POA: Diagnosis not present

## 2020-07-14 DIAGNOSIS — N2581 Secondary hyperparathyroidism of renal origin: Secondary | ICD-10-CM | POA: Diagnosis not present

## 2020-07-14 DIAGNOSIS — N179 Acute kidney failure, unspecified: Secondary | ICD-10-CM | POA: Diagnosis not present

## 2020-07-14 DIAGNOSIS — I129 Hypertensive chronic kidney disease with stage 1 through stage 4 chronic kidney disease, or unspecified chronic kidney disease: Secondary | ICD-10-CM | POA: Diagnosis not present

## 2020-07-14 DIAGNOSIS — E785 Hyperlipidemia, unspecified: Secondary | ICD-10-CM | POA: Diagnosis not present

## 2020-07-14 DIAGNOSIS — N058 Unspecified nephritic syndrome with other morphologic changes: Secondary | ICD-10-CM | POA: Diagnosis not present

## 2020-07-16 ENCOUNTER — Other Ambulatory Visit: Payer: Self-pay

## 2020-07-16 ENCOUNTER — Encounter (HOSPITAL_COMMUNITY)
Admission: RE | Admit: 2020-07-16 | Discharge: 2020-07-16 | Disposition: A | Discharge status: Home / Self Care | Payer: Medicare Other | Source: Ambulatory Visit | Attending: Nephrology | Admitting: Nephrology

## 2020-07-16 VITALS — BP 171/93 | HR 62 | Temp 97.6°F | Resp 20

## 2020-07-16 DIAGNOSIS — N189 Chronic kidney disease, unspecified: Secondary | ICD-10-CM

## 2020-07-16 DIAGNOSIS — D631 Anemia in chronic kidney disease: Secondary | ICD-10-CM | POA: Diagnosis not present

## 2020-07-16 LAB — BASIC METABOLIC PANEL
Anion gap: 12 (ref 5–15)
BUN: 43 mg/dL — ABNORMAL HIGH (ref 8–23)
CO2: 26 mmol/L (ref 22–32)
Calcium: 9.1 mg/dL (ref 8.9–10.3)
Chloride: 107 mmol/L (ref 98–111)
Creatinine, Ser: 3.09 mg/dL — ABNORMAL HIGH (ref 0.44–1.00)
GFR calc Af Amer: 17 mL/min — ABNORMAL LOW (ref >60)
GFR calc non Af Amer: 14 mL/min — ABNORMAL LOW (ref >60)
Glucose, Bld: 94 mg/dL (ref 70–99)
Potassium: 4.4 mmol/L (ref 3.5–5.1)
Sodium: 145 mmol/L (ref 135–145)

## 2020-07-16 LAB — FERRITIN: Ferritin: 1385 ng/mL — ABNORMAL HIGH (ref 11–307)

## 2020-07-16 LAB — IRON AND TIBC
Iron: 73 ug/dL (ref 28–170)
Saturation Ratios: 24 % (ref 10.4–31.8)
TIBC: 307 ug/dL (ref 250–450)
UIBC: 234 ug/dL

## 2020-07-16 LAB — POCT HEMOGLOBIN-HEMACUE: Hemoglobin: 11.4 g/dL — ABNORMAL LOW (ref 12.0–15.0)

## 2020-07-16 MED ORDER — EPOETIN ALFA-EPBX 10000 UNIT/ML IJ SOLN
INTRAMUSCULAR | Status: AC
  Filled 2020-07-16: qty 1

## 2020-07-16 MED ORDER — EPOETIN ALFA-EPBX 10000 UNIT/ML IJ SOLN
10000.0000 [IU] | INTRAMUSCULAR | Status: DC
  Administered 2020-07-16: 10000 [IU] via SUBCUTANEOUS

## 2020-07-30 ENCOUNTER — Encounter (HOSPITAL_COMMUNITY)
Admission: RE | Admit: 2020-07-30 | Discharge: 2020-07-30 | Disposition: A | Discharge status: Home / Self Care | Payer: Medicare Other | Source: Ambulatory Visit | Attending: Nephrology | Admitting: Nephrology

## 2020-07-30 ENCOUNTER — Other Ambulatory Visit: Payer: Self-pay

## 2020-07-30 VITALS — BP 168/75 | HR 75 | Temp 97.2°F | Resp 20

## 2020-07-30 DIAGNOSIS — N189 Chronic kidney disease, unspecified: Secondary | ICD-10-CM | POA: Diagnosis not present

## 2020-07-30 DIAGNOSIS — D631 Anemia in chronic kidney disease: Secondary | ICD-10-CM | POA: Diagnosis not present

## 2020-07-30 LAB — POCT HEMOGLOBIN-HEMACUE: Hemoglobin: 11 g/dL — ABNORMAL LOW (ref 12.0–15.0)

## 2020-07-30 MED ORDER — EPOETIN ALFA-EPBX 10000 UNIT/ML IJ SOLN
INTRAMUSCULAR | Status: AC
  Filled 2020-07-30: qty 1

## 2020-07-30 MED ORDER — EPOETIN ALFA-EPBX 10000 UNIT/ML IJ SOLN
10000.0000 [IU] | INTRAMUSCULAR | Status: DC
  Administered 2020-07-30: 10000 [IU] via SUBCUTANEOUS

## 2020-08-05 DIAGNOSIS — N2581 Secondary hyperparathyroidism of renal origin: Secondary | ICD-10-CM | POA: Diagnosis not present

## 2020-08-05 DIAGNOSIS — N179 Acute kidney failure, unspecified: Secondary | ICD-10-CM | POA: Diagnosis not present

## 2020-08-11 DIAGNOSIS — N189 Chronic kidney disease, unspecified: Secondary | ICD-10-CM | POA: Diagnosis not present

## 2020-08-11 DIAGNOSIS — N2581 Secondary hyperparathyroidism of renal origin: Secondary | ICD-10-CM | POA: Diagnosis not present

## 2020-08-11 DIAGNOSIS — E785 Hyperlipidemia, unspecified: Secondary | ICD-10-CM | POA: Diagnosis not present

## 2020-08-11 DIAGNOSIS — N058 Unspecified nephritic syndrome with other morphologic changes: Secondary | ICD-10-CM | POA: Diagnosis not present

## 2020-08-11 DIAGNOSIS — H538 Other visual disturbances: Secondary | ICD-10-CM | POA: Diagnosis not present

## 2020-08-11 DIAGNOSIS — I129 Hypertensive chronic kidney disease with stage 1 through stage 4 chronic kidney disease, or unspecified chronic kidney disease: Secondary | ICD-10-CM | POA: Diagnosis not present

## 2020-08-11 DIAGNOSIS — D631 Anemia in chronic kidney disease: Secondary | ICD-10-CM | POA: Diagnosis not present

## 2020-08-11 DIAGNOSIS — E8770 Fluid overload, unspecified: Secondary | ICD-10-CM | POA: Diagnosis not present

## 2020-08-11 DIAGNOSIS — N057 Unspecified nephritic syndrome with diffuse crescentic glomerulonephritis: Secondary | ICD-10-CM | POA: Diagnosis not present

## 2020-08-11 DIAGNOSIS — E875 Hyperkalemia: Secondary | ICD-10-CM | POA: Diagnosis not present

## 2020-08-11 DIAGNOSIS — E872 Acidosis: Secondary | ICD-10-CM | POA: Diagnosis not present

## 2020-08-11 DIAGNOSIS — N179 Acute kidney failure, unspecified: Secondary | ICD-10-CM | POA: Diagnosis not present

## 2020-08-13 ENCOUNTER — Encounter (HOSPITAL_COMMUNITY): Payer: Medicare Other

## 2020-08-18 ENCOUNTER — Encounter (INDEPENDENT_AMBULATORY_CARE_PROVIDER_SITE_OTHER): Payer: Medicare Other | Admitting: Ophthalmology

## 2020-08-21 DIAGNOSIS — N179 Acute kidney failure, unspecified: Secondary | ICD-10-CM | POA: Diagnosis not present

## 2020-08-21 MED FILL — CYCLOPHOSPHAMIDE 25 MG CAPS: 25 | 30 days supply | Qty: 90 | Fill #2

## 2020-08-27 ENCOUNTER — Encounter (HOSPITAL_COMMUNITY)
Admission: RE | Admit: 2020-08-27 | Discharge: 2020-08-27 | Disposition: A | Discharge status: Home / Self Care | Payer: Medicare Other | Source: Ambulatory Visit | Attending: Nephrology | Admitting: Nephrology

## 2020-08-27 ENCOUNTER — Other Ambulatory Visit: Payer: Self-pay

## 2020-08-27 VITALS — BP 159/82 | HR 58 | Temp 97.4°F | Resp 20

## 2020-08-27 DIAGNOSIS — N189 Chronic kidney disease, unspecified: Secondary | ICD-10-CM

## 2020-08-27 DIAGNOSIS — D631 Anemia in chronic kidney disease: Secondary | ICD-10-CM

## 2020-08-27 LAB — CBC WITH DIFFERENTIAL/PLATELET
Abs Immature Granulocytes: 0.36 10*3/uL — ABNORMAL HIGH (ref 0.00–0.07)
Basophils Absolute: 0 10*3/uL (ref 0.0–0.1)
Basophils Relative: 0 %
Eosinophils Absolute: 0 10*3/uL (ref 0.0–0.5)
Eosinophils Relative: 0 %
HCT: 39.4 % (ref 36.0–46.0)
Hemoglobin: 11.9 g/dL — ABNORMAL LOW (ref 12.0–15.0)
Immature Granulocytes: 3 %
Lymphocytes Relative: 4 %
Lymphs Abs: 0.6 10*3/uL — ABNORMAL LOW (ref 0.7–4.0)
MCH: 33 pg (ref 26.0–34.0)
MCHC: 30.2 g/dL (ref 30.0–36.0)
MCV: 109.1 fL — ABNORMAL HIGH (ref 80.0–100.0)
Monocytes Absolute: 1.6 10*3/uL — ABNORMAL HIGH (ref 0.1–1.0)
Monocytes Relative: 11 %
Neutro Abs: 11.9 10*3/uL — ABNORMAL HIGH (ref 1.7–7.7)
Neutrophils Relative %: 82 %
Platelets: 374 10*3/uL (ref 150–400)
RBC: 3.61 MIL/uL — ABNORMAL LOW (ref 3.87–5.11)
RDW: 15.6 % — ABNORMAL HIGH (ref 11.5–15.5)
WBC: 14.5 10*3/uL — ABNORMAL HIGH (ref 4.0–10.5)
nRBC: 0 % (ref 0.0–0.2)

## 2020-08-27 LAB — COMPREHENSIVE METABOLIC PANEL
ALT: 18 U/L (ref 0–44)
AST: 19 U/L (ref 15–41)
Albumin: 3.1 g/dL — ABNORMAL LOW (ref 3.5–5.0)
Alkaline Phosphatase: 70 U/L (ref 38–126)
Anion gap: 10 (ref 5–15)
BUN: 62 mg/dL — ABNORMAL HIGH (ref 8–23)
CO2: 23 mmol/L (ref 22–32)
Calcium: 9.6 mg/dL (ref 8.9–10.3)
Chloride: 110 mmol/L (ref 98–111)
Creatinine, Ser: 3.55 mg/dL — ABNORMAL HIGH (ref 0.44–1.00)
GFR calc Af Amer: 14 mL/min — ABNORMAL LOW (ref >60)
GFR calc non Af Amer: 12 mL/min — ABNORMAL LOW (ref >60)
Glucose, Bld: 60 mg/dL — ABNORMAL LOW (ref 70–99)
Potassium: 4.5 mmol/L (ref 3.5–5.1)
Sodium: 143 mmol/L (ref 135–145)
Total Bilirubin: 0.4 mg/dL (ref 0.3–1.2)
Total Protein: 6.5 g/dL (ref 6.5–8.1)

## 2020-08-27 LAB — PROTEIN / CREATININE RATIO, URINE
Creatinine, Urine: 94.58 mg/dL
Protein Creatinine Ratio: 2.23 mg/mg{Cre} — ABNORMAL HIGH (ref 0.00–0.15)
Total Protein, Urine: 211 mg/dL

## 2020-08-27 LAB — URINALYSIS, COMPLETE (UACMP) WITH MICROSCOPIC
Bilirubin Urine: NEGATIVE
Glucose, UA: NEGATIVE mg/dL
Hgb urine dipstick: NEGATIVE
Ketones, ur: NEGATIVE mg/dL
Leukocytes,Ua: NEGATIVE
Nitrite: NEGATIVE
Protein, ur: 100 mg/dL — AB
Specific Gravity, Urine: 1.013 (ref 1.005–1.030)
pH: 5 (ref 5.0–8.0)

## 2020-08-27 LAB — IRON AND TIBC
Iron: 107 ug/dL (ref 28–170)
Saturation Ratios: 32 % — ABNORMAL HIGH (ref 10.4–31.8)
TIBC: 332 ug/dL (ref 250–450)
UIBC: 225 ug/dL

## 2020-08-27 LAB — PHOSPHORUS: Phosphorus: 4.1 mg/dL (ref 2.5–4.6)

## 2020-08-27 LAB — MAGNESIUM: Magnesium: 2 mg/dL (ref 1.7–2.4)

## 2020-08-27 LAB — POCT HEMOGLOBIN-HEMACUE: Hemoglobin: 11.9 g/dL — ABNORMAL LOW (ref 12.0–15.0)

## 2020-08-27 LAB — FERRITIN: Ferritin: 1455 ng/mL — ABNORMAL HIGH (ref 11–307)

## 2020-08-27 MED ORDER — EPOETIN ALFA-EPBX 10000 UNIT/ML IJ SOLN
INTRAMUSCULAR | Status: AC
  Administered 2020-08-27: 10000 [IU]
  Filled 2020-08-27: qty 1

## 2020-08-27 MED ORDER — EPOETIN ALFA-EPBX 10000 UNIT/ML IJ SOLN: 10000.0000 [IU] | INTRAMUSCULAR | Status: DC

## 2020-08-28 LAB — PTH, INTACT AND CALCIUM
Calcium, Total (PTH): 9.7 mg/dL (ref 8.7–10.3)
PTH: 41 pg/mL (ref 15–65)

## 2020-08-28 NOTE — Progress Notes (Signed)
Referring Provider: Sharilyn Sites, MD Primary Care Physician:  Jake Samples, PA-C Primary GI Physician: Dr. Gala Romney  Chief Complaint  Patient presents with  . Abdominal Pain    right side, comes/goes. No nausea, no vomiting, no diarrhea, no constipation    HPI:   Cheryl [name] is a 74 y.o. female presenting today for follow-up. History of GERD, PUD in 2017, constipation, adenomatous colon polyps. Last colonoscopy in August 2016 with melanosis coli, colonic diverticulosis with recommendation to repeat in 5 years (2021). Last EGD in August 2017 with normal esophagus, previous gastric ulcer completely healed, small hiatal hernia, normal examined duodenum. 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  She was found to be anemic at that time which was suspected to be secondary to renal failure and low folate. She is being treated with prednisone and has been receiving Retacrit.  Most recent labs completed 08/27/2020 remarkable for WBC 14.5, hemoglobin 11.9 with MCV 109.1, creatinine 3.55.   She was last seen in our office 06/20/2020 and reported nausea and occasional upper abdominal discomfort which was improved since prior hospitalization in May. Abdominal discomfort maybe once a week. Nausea/vomiting only 2-3 times a week, decreased from daily which had started at the time of her kidney trouble. Noted milk helped symptoms. She was taking Protonix daily for GERD which kept symptoms well controlled. Occasionally would take a second Protonix if she had a "sour" stomach which helped. She was following with [facility] and was currently on 30 mg of prednisone daily. Constipation well controlled on Linzess 290 mcg. Denied overt GI bleeding. No NSAIDs. She was advised to increase Protonix to 40 mg twice daily, continue Linzess, and follow-up in 2 months. Patient preferred to hold off on any sort of  endoscopic evaluation until her kidney function stabilized.  Today:  Nausea has essentially resolved. States it is 95% better. Epigastric abdominal pain has resolved with increasing Protonix. No GERD symptoms or dysphagia.  Intermittent RUQ abdominal pain. This is daily. Can be brief or last all day. Pain is not affected by eating. Worse when taking a deep breath. Can feel pain up around her right breast or in her back. No identified triggers. Had similar symptoms when she had pneumonia back in May. Symptoms had improved but started 2 weeks ago. Intermittent shortness of breath with exertion. No cough. No fever or chills. No cold or flu like symptoms.  BMs daily with Linzess 290 mcg daily.  No constipation or diarrhea. No blood in the stool or black stool.   Has a torn retina in her left eye. This is being monitored for now.     States she has been started on something to help with hyperkalemia but isn't sure of the name. I have asked she call us when she gets home to let us know.   Past Medical History:  Diagnosis Date  . Acid reflux   . Arthritis   . CKD (chronic kidney disease)   . Constipation   . GERD (gastroesophageal reflux disease)   . Neuromuscular disorder ([place] [facility])   . PUD (peptic ulcer disease) 2017   gastric ulcer healed on repeat EGD in August 2017  . Sinus drainage   . Sleep apnea    had surgery to correct    Past Surgical History:  Procedure Laterality Date  . ANTERIOR CERVICAL DECOMP/DISCECTOMY FUSION N/A 11/19/2016   Procedure: ANTERIOR CERVICAL DISCECTOMY AND FUSION  C5-6 WITH PLATES, SCREWS, CAGE, VIVIGEN II;  Surgeon: Jessy Oto, MD;  Location: [facility];  Service: Orthopedics;  Laterality: N/A;  . CARPAL TUNNEL RELEASE    . CATARACT EXTRACTION, BILATERAL Bilateral 2017   One in December 2017 and one in January 2018  . COLONOSCOPY  02/15/2005   NID:POEUM small polyps ablated via cold biopsy, one from transverse colon and two from the rectum/Small external hemorrhoids   . COLONOSCOPY  07/10/2010   PNT:IRWERX rectum/long redundant colon, polyps in the sigmoid, descending, hepatic flexure/ADENOMATOUS POLYPS. next TCS due 06/2015  . COLONOSCOPY  1995   3 polyps, path revealed chronic colitis  . COLONOSCOPY N/A 08/05/2015   Procedure: COLONOSCOPY;  Surgeon: Daneil Dolin, MD; melanosis coli, colonic diverticulosis. Repeat colonoscopy in 5 years for surveillance.  . ESOPHAGOGASTRODUODENOSCOPY  1995   Gastritis  . ESOPHAGOGASTRODUODENOSCOPY N/A 04/16/2013   RMR: HH  . ESOPHAGOGASTRODUODENOSCOPY N/A 05/13/2016   Procedure: ESOPHAGOGASTRODUODENOSCOPY (EGD);  Surgeon: Daneil Dolin, MD; gastric ulcer and erosions s/p biopsy, erosive gastropathy. Path of reactive and regenerative changes, no H. pylori.  . ESOPHAGOGASTRODUODENOSCOPY N/A 08/18/2016   Procedure: ESOPHAGOGASTRODUODENOSCOPY (EGD);  Surgeon: Daneil Dolin, MD; normal esophagus, previous gastric ulcer completely healed, small hiatal hernia, normal duodenum.  Marland Kitchen KNEE SURGERY     x2  . ROTATOR CUFF REPAIR     x2  . SHOULDER ARTHROSCOPY WITH ROTATOR CUFF REPAIR AND SUBACROMIAL DECOMPRESSION Right 01/21/2015   Procedure: RIGHT SHOULDER ARTHROSCOPIC DEBRIDEMNT OF G-H JOINT AND REMOVAL OF LOOSE BODIES,ARTHROSCOPIC SUBACROMIAL DECOMPRESSION,MINI OPEN RCT REPAIR WITH SUPPLEMENTAL [facility] PATCH;  Surgeon: Garald Balding, MD;  Location: [facility];  Service: Orthopedics;  Laterality: Right;  . TONSILLECTOMY    . TRIGGER FINGER RELEASE    . UVULOPALATOPHARYNGOPLASTY      Current Outpatient Medications  Medication Sig Dispense Refill  . acetaminophen (TYLENOL) 500 MG tablet Take 500-1,000 mg by mouth as needed (for pain/headache).     Marland Kitchen albuterol (VENTOLIN HFA) 108 (90 Base) MCG/ACT inhaler Inhale 2 puffs into the lungs every 6 (six) hours as needed for wheezing or shortness of breath. 18 g 2  . buPROPion (WELLBUTRIN XL) 300 MG 24 hr tablet Take 300 mg by mouth daily.    Marland Kitchen CALCIUM-VITAMIN D PO Take 5 mLs by mouth  every morning.     . Cholecalciferol (VITAMIN D3) 125 MCG (5000 UT) CAPS Take 1 capsule by mouth daily.    . cyclophosphamide (CYTOXAN) 25 MG capsule Take by mouth daily. Takes 3 tablets by mouth daily.    . diclofenac sodium (VOLTAREN) 1 % GEL Apply 2-4 g topically 4 (four) times daily. (Patient taking differently: Apply 2-4 g topically 4 (four) times daily as needed (for pain). ) 5 Tube 3  . furosemide (LASIX) 80 MG tablet 3 (three) times a week.    Marland Kitchen guaiFENesin (MUCINEX) 600 MG 12 hr tablet Take 1 tablet (600 mg total) by mouth 2 (two) times daily. (Patient taking differently: Take 600 mg by mouth 2 (two) times daily as needed for cough or to loosen phlegm. ) 20 tablet 0  . linaclotide (LINZESS) 290 MCG CAPS capsule Take 1 capsule (290 mcg total) by mouth daily before breakfast. 90 capsule 3  . montelukast (SINGULAIR) 10 MG tablet Take 1 tablet (10 mg total) by mouth every morning. 30 tablet 2  . nystatin (MYCOSTATIN) 100000 UNIT/ML suspension Take 5 mLs by mouth 2 (two) times daily as needed (for thrush).     . pantoprazole (PROTONIX) 40 MG  tablet Take 1 tablet (40 mg total) by mouth 2 (two) times daily before a meal. 60 tablet 3  . polyethylene glycol (MIRALAX / GLYCOLAX) packet Take 17 g by mouth daily as needed for mild constipation or moderate constipation.     . predniSONE (DELTASONE) 20 MG tablet Take 25 mg by mouth daily.     . raloxifene (EVISTA) 60 MG tablet Take 60 mg by mouth daily.    Marland Kitchen rOPINIRole (REQUIP) 0.25 MG tablet Take 0.75 mg by mouth daily as needed (for rls).     . tolterodine (DETROL) 1 MG tablet Take 1 mg by mouth 2 (two) times daily.    . vitamin B-12 (CYANOCOBALAMIN) 1000 MCG tablet Take 1,000 mcg by mouth daily.    . ondansetron (ZOFRAN) 4 MG tablet Take 4 mg by mouth as needed for nausea or vomiting.  (Patient not taking: Reported on 08/31/2020)     No current facility-administered medications for this visit.    Allergies as of 08/29/2020 - Review Complete  08/29/2020  Allergen Reaction Noted  . Prednisone Other (See Comments) 04/04/2013  . Tramadol Nausea Only 01/21/2015  . Aspirin Nausea And Vomiting   . Celecoxib Rash     Family History  Problem Relation Age of Onset  . Cancer Mother   . Cancer Father   . Diabetes Sister   . Colon cancer Neg Hx   . Liver disease Neg Hx     Social History   Socioeconomic History  . Marital status: Married    Spouse name: Not on file  . Number of children: 2  . Years of education: Not on file  . Highest education level: Not on file  Occupational History  . Occupation: BUS DRIVER    Employer: [employer]  Tobacco Use  . Smoking status: Former Smoker    Packs/day: 0.10    Years: 48.00    Pack years: 4.80    Types: Cigarettes    Quit date: 05/11/2005    Years since quitting: 15.3  . Smokeless tobacco: Never Used  Substance and Sexual Activity  . Alcohol use: No    Alcohol/week: 0.0 standard drinks  . Drug use: No  . Sexual activity: Not on file  Other Topics Concern  . Not on file  Social History Narrative  . Not on file   Social Determinants of Health   Financial Resource Strain:   . Difficulty of Paying Living Expenses: Not on file  Food Insecurity:   . Worried About Charity fundraiser in the Last Year: Not on file  . Ran Out of Food in the Last Year: Not on file  Transportation Needs:   . Lack of Transportation (Medical): Not on file  . Lack of Transportation (Non-Medical): Not on file  Physical Activity:   . Days of Exercise per Week: Not on file  . Minutes of Exercise per Session: Not on file  Stress:   . Feeling of Stress : Not on file  Social Connections:   . Frequency of Communication with Friends and Family: Not on file  . Frequency of Social Gatherings with Friends and Family: Not on file  . Attends Religious Services: Not on file  . Active Member of Clubs or Organizations: Not on file  . Attends Archivist Meetings: Not on file  . Marital  Status: Not on file    Review of Systems: Gen: Denies fever, chills, cold or flu like symptoms, pre-syncope, or syncope.   CV:  Denies chest pain or palpitations.  Resp: See HPI GI: See HPI Derm: Denies rash Psych: See HPI Heme: See HPI  Physical Exam: BP (!) 167/77   Pulse (!) 50   Temp (!) 97.1 F (36.2 C) (Oral)   Ht 4\' 11"  (1.499 m)   Wt 197 lb 9.6 oz (89.6 kg)   BMI 39.91 kg/m  General:   Alert and oriented. No distress noted. Pleasant and cooperative.  Head:  Normocephalic and atraumatic. Eyes:  Conjuctiva clear without scleral icterus. Heart:  S1, S2 present without murmurs appreciated. Lungs:  Clear to auscultation bilaterally. No wheezes, rales, or rhonchi. No distress.  Abdomen:  +BS, soft, non-tender and non-distended. No rebound or guarding. No HSM or masses noted.  She does have tenderness to palpation along the lower right rib cage/rib margin. Msk:  Symmetrical without gross deformities. Normal posture.  Neurologic:  Alert and  oriented x4  Psych:  Normal mood and affect.

## 2020-08-28 NOTE — H&P (View-Only) (Signed)
Referring Provider: Sharilyn Sites, MD Primary Care Physician:  Jake Samples, PA-C Primary GI Physician: Dr. Gala Romney  Chief Complaint  Patient presents with  . Abdominal Pain    right side, comes/goes. No nausea, no vomiting, no diarrhea, no constipation    HPI:   Cheryl [name] is a 74 y.o. female presenting today for follow-up. History of GERD, PUD in 2017, constipation, adenomatous colon polyps. Last colonoscopy in August 2016 with melanosis coli, colonic diverticulosis with recommendation to repeat in 5 years (2021). Last EGD in August 2017 with normal esophagus, previous gastric ulcer completely healed, small hiatal hernia, normal examined duodenum. 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  She was found to be anemic at that time which was suspected to be secondary to renal failure and low folate. She is being treated with prednisone and has been receiving Retacrit.  Most recent labs completed 08/27/2020 remarkable for WBC 14.5, hemoglobin 11.9 with MCV 109.1, creatinine 3.55.   She was last seen in our office 06/20/2020 and reported nausea and occasional upper abdominal discomfort which was improved since prior hospitalization in May. Abdominal discomfort maybe once a week. Nausea/vomiting only 2-3 times a week, decreased from daily which had started at the time of her kidney trouble. Noted milk helped symptoms. She was taking Protonix daily for GERD which kept symptoms well controlled. Occasionally would take a second Protonix if she had a "sour" stomach which helped. She was following with [facility] and was currently on 30 mg of prednisone daily. Constipation well controlled on Linzess 290 mcg. Denied overt GI bleeding. No NSAIDs. She was advised to increase Protonix to 40 mg twice daily, continue Linzess, and follow-up in 2 months. Patient preferred to hold off on any sort of  endoscopic evaluation until her kidney function stabilized.  Today:  Nausea has essentially resolved. States it is 95% better. Epigastric abdominal pain has resolved with increasing Protonix. No GERD symptoms or dysphagia.  Intermittent RUQ abdominal pain. This is daily. Can be brief or last all day. Pain is not affected by eating. Worse when taking a deep breath. Can feel pain up around her right breast or in her back. No identified triggers. Had similar symptoms when she had pneumonia back in May. Symptoms had improved but started 2 weeks ago. Intermittent shortness of breath with exertion. No cough. No fever or chills. No cold or flu like symptoms.  BMs daily with Linzess 290 mcg daily.  No constipation or diarrhea. No blood in the stool or black stool.   Has a torn retina in her left eye. This is being monitored for now.     States she has been started on something to help with hyperkalemia but isn't sure of the name. I have asked she call us when she gets home to let us know.   Past Medical History:  Diagnosis Date  . Acid reflux   . Arthritis   . CKD (chronic kidney disease)   . Constipation   . GERD (gastroesophageal reflux disease)   . Neuromuscular disorder ([place])   . PUD (peptic ulcer disease) 2017   gastric ulcer healed on repeat EGD in August 2017  . Sinus drainage   . Sleep apnea    had surgery to correct    Past Surgical History:  Procedure Laterality Date  . ANTERIOR CERVICAL DECOMP/DISCECTOMY FUSION N/A 11/19/2016   Procedure: ANTERIOR CERVICAL DISCECTOMY AND FUSION  C5-6 WITH PLATES, SCREWS, CAGE, VIVIGEN II;  Surgeon: Jessy Oto, MD;  Location: [facility];  Service: Orthopedics;  Laterality: N/A;  . CARPAL TUNNEL RELEASE    . CATARACT EXTRACTION, BILATERAL Bilateral 2017   One in December 2017 and one in January 2018  . COLONOSCOPY  02/15/2005   YKD:XIPJA small polyps ablated via cold biopsy, one from transverse colon and two from the rectum/Small external hemorrhoids   . COLONOSCOPY  07/10/2010   SNK:NLZJQB rectum/long redundant colon, polyps in the sigmoid, descending, hepatic flexure/ADENOMATOUS POLYPS. next TCS due 06/2015  . COLONOSCOPY  1995   3 polyps, path revealed chronic colitis  . COLONOSCOPY N/A 08/05/2015   Procedure: COLONOSCOPY;  Surgeon: Daneil Dolin, MD; melanosis coli, colonic diverticulosis. Repeat colonoscopy in 5 years for surveillance.  . ESOPHAGOGASTRODUODENOSCOPY  1995   Gastritis  . ESOPHAGOGASTRODUODENOSCOPY N/A 04/16/2013   RMR: HH  . ESOPHAGOGASTRODUODENOSCOPY N/A 05/13/2016   Procedure: ESOPHAGOGASTRODUODENOSCOPY (EGD);  Surgeon: Daneil Dolin, MD; gastric ulcer and erosions s/p biopsy, erosive gastropathy. Path of reactive and regenerative changes, no H. pylori.  . ESOPHAGOGASTRODUODENOSCOPY N/A 08/18/2016   Procedure: ESOPHAGOGASTRODUODENOSCOPY (EGD);  Surgeon: Daneil Dolin, MD; normal esophagus, previous gastric ulcer completely healed, small hiatal hernia, normal duodenum.  Marland Kitchen KNEE SURGERY     x2  . ROTATOR CUFF REPAIR     x2  . SHOULDER ARTHROSCOPY WITH ROTATOR CUFF REPAIR AND SUBACROMIAL DECOMPRESSION Right 01/21/2015   Procedure: RIGHT SHOULDER ARTHROSCOPIC DEBRIDEMNT OF G-H JOINT AND REMOVAL OF LOOSE BODIES,ARTHROSCOPIC SUBACROMIAL DECOMPRESSION,MINI OPEN RCT REPAIR WITH SUPPLEMENTAL [facility] PATCH;  Surgeon: Garald Balding, MD;  Location: [facility];  Service: Orthopedics;  Laterality: Right;  . TONSILLECTOMY    . TRIGGER FINGER RELEASE    . UVULOPALATOPHARYNGOPLASTY      Current Outpatient Medications  Medication Sig Dispense Refill  . acetaminophen (TYLENOL) 500 MG tablet Take 500-1,000 mg by mouth as needed (for pain/headache).     Marland Kitchen albuterol (VENTOLIN HFA) 108 (90 Base) MCG/ACT inhaler Inhale 2 puffs into the lungs every 6 (six) hours as needed for wheezing or shortness of breath. 18 g 2  . buPROPion (WELLBUTRIN XL) 300 MG 24 hr tablet Take 300 mg by mouth daily.    Marland Kitchen CALCIUM-VITAMIN D PO Take 5 mLs by mouth  every morning.     . Cholecalciferol (VITAMIN D3) 125 MCG (5000 UT) CAPS Take 1 capsule by mouth daily.    . cyclophosphamide (CYTOXAN) 25 MG capsule Take by mouth daily. Takes 3 tablets by mouth daily.    . diclofenac sodium (VOLTAREN) 1 % GEL Apply 2-4 g topically 4 (four) times daily. (Patient taking differently: Apply 2-4 g topically 4 (four) times daily as needed (for pain). ) 5 Tube 3  . furosemide (LASIX) 80 MG tablet 3 (three) times a week.    Marland Kitchen guaiFENesin (MUCINEX) 600 MG 12 hr tablet Take 1 tablet (600 mg total) by mouth 2 (two) times daily. (Patient taking differently: Take 600 mg by mouth 2 (two) times daily as needed for cough or to loosen phlegm. ) 20 tablet 0  . linaclotide (LINZESS) 290 MCG CAPS capsule Take 1 capsule (290 mcg total) by mouth daily before breakfast. 90 capsule 3  . montelukast (SINGULAIR) 10 MG tablet Take 1 tablet (10 mg total) by mouth every morning. 30 tablet 2  . nystatin (MYCOSTATIN) 100000 UNIT/ML suspension Take 5 mLs by mouth 2 (two) times daily as needed (for thrush).     . pantoprazole (PROTONIX) 40 MG  tablet Take 1 tablet (40 mg total) by mouth 2 (two) times daily before a meal. 60 tablet 3  . polyethylene glycol (MIRALAX / GLYCOLAX) packet Take 17 g by mouth daily as needed for mild constipation or moderate constipation.     . predniSONE (DELTASONE) 20 MG tablet Take 25 mg by mouth daily.     . raloxifene (EVISTA) 60 MG tablet Take 60 mg by mouth daily.    Marland Kitchen rOPINIRole (REQUIP) 0.25 MG tablet Take 0.75 mg by mouth daily as needed (for rls).     . tolterodine (DETROL) 1 MG tablet Take 1 mg by mouth 2 (two) times daily.    . vitamin B-12 (CYANOCOBALAMIN) 1000 MCG tablet Take 1,000 mcg by mouth daily.    . ondansetron (ZOFRAN) 4 MG tablet Take 4 mg by mouth as needed for nausea or vomiting.  (Patient not taking: Reported on 08/31/2020)     No current facility-administered medications for this visit.    Allergies as of 08/29/2020 - Review Complete  08/29/2020  Allergen Reaction Noted  . Prednisone Other (See Comments) 04/04/2013  . Tramadol Nausea Only 01/21/2015  . Aspirin Nausea And Vomiting   . Celecoxib Rash     Family History  Problem Relation Age of Onset  . Cancer Mother   . Cancer Father   . Diabetes Sister   . Colon cancer Neg Hx   . Liver disease Neg Hx     Social History   Socioeconomic History  . Marital status: Married    Spouse name: Not on file  . Number of children: 2  . Years of education: Not on file  . Highest education level: Not on file  Occupational History  . Occupation: BUS DRIVER    Employer: [employer]  Tobacco Use  . Smoking status: Former Smoker    Packs/day: 0.10    Years: 48.00    Pack years: 4.80    Types: Cigarettes    Quit date: 05/11/2005    Years since quitting: 15.3  . Smokeless tobacco: Never Used  Substance and Sexual Activity  . Alcohol use: No    Alcohol/week: 0.0 standard drinks  . Drug use: No  . Sexual activity: Not on file  Other Topics Concern  . Not on file  Social History Narrative  . Not on file   Social Determinants of Health   Financial Resource Strain:   . Difficulty of Paying Living Expenses: Not on file  Food Insecurity:   . Worried About Charity fundraiser in the Last Year: Not on file  . Ran Out of Food in the Last Year: Not on file  Transportation Needs:   . Lack of Transportation (Medical): Not on file  . Lack of Transportation (Non-Medical): Not on file  Physical Activity:   . Days of Exercise per Week: Not on file  . Minutes of Exercise per Session: Not on file  Stress:   . Feeling of Stress : Not on file  Social Connections:   . Frequency of Communication with Friends and Family: Not on file  . Frequency of Social Gatherings with Friends and Family: Not on file  . Attends Religious Services: Not on file  . Active Member of Clubs or Organizations: Not on file  . Attends Archivist Meetings: Not on file  . Marital  Status: Not on file    Review of Systems: Gen: Denies fever, chills, cold or flu like symptoms, pre-syncope, or syncope.   CV:  Denies chest pain or palpitations.  Resp: See HPI GI: See HPI Derm: Denies rash Psych: See HPI Heme: See HPI  Physical Exam: BP (!) 167/77   Pulse (!) 50   Temp (!) 97.1 F (36.2 C) (Oral)   Ht 4\' 11"  (1.499 m)   Wt 197 lb 9.6 oz (89.6 kg)   BMI 39.91 kg/m  General:   Alert and oriented. No distress noted. Pleasant and cooperative.  Head:  Normocephalic and atraumatic. Eyes:  Conjuctiva clear without scleral icterus. Heart:  S1, S2 present without murmurs appreciated. Lungs:  Clear to auscultation bilaterally. No wheezes, rales, or rhonchi. No distress.  Abdomen:  +BS, soft, non-tender and non-distended. No rebound or guarding. No HSM or masses noted.  She does have tenderness to palpation along the lower right rib cage/rib margin. Msk:  Symmetrical without gross deformities. Normal posture.  Neurologic:  Alert and  oriented x4  Psych:  Normal mood and affect.

## 2020-08-29 ENCOUNTER — Encounter: Payer: Self-pay | Admitting: Gastroenterology

## 2020-08-29 ENCOUNTER — Ambulatory Visit (INDEPENDENT_AMBULATORY_CARE_PROVIDER_SITE_OTHER): Payer: Medicare Other | Admitting: Gastroenterology

## 2020-08-29 ENCOUNTER — Encounter: Payer: Self-pay | Admitting: *Deleted

## 2020-08-29 ENCOUNTER — Other Ambulatory Visit: Payer: Self-pay

## 2020-08-29 VITALS — BP 167/77 | HR 50 | Temp 97.1°F | Ht 59.0 in | Wt 197.6 lb

## 2020-08-29 DIAGNOSIS — R1013 Epigastric pain: Secondary | ICD-10-CM | POA: Diagnosis not present

## 2020-08-29 DIAGNOSIS — K5900 Constipation, unspecified: Secondary | ICD-10-CM | POA: Diagnosis not present

## 2020-08-29 DIAGNOSIS — Z86010 Personal history of colonic polyps: Secondary | ICD-10-CM

## 2020-08-29 DIAGNOSIS — R109 Unspecified abdominal pain: Secondary | ICD-10-CM

## 2020-08-29 DIAGNOSIS — K219 Gastro-esophageal reflux disease without esophagitis: Secondary | ICD-10-CM

## 2020-08-29 NOTE — Patient Instructions (Addendum)
Please call and update [place] on your medication list when you get home.   We will get you scheduled for a colonoscopy in the near future with Dr. Gala Romney.  I suspect you have some inflammation in the tissue within your rib cage that is creating the right sided pain you are having.   I recommend you try tylenol for your pain. You can take 500 mg 4 times daily to see if this helps. I recommend we avoid ibuprofen for now due to your kidney trouble. Please follow-up with your PCP if your symptoms do not improve or if you develop shortness of breath, cough, or fever.   Continue Protonix 40 mg twice daily 30 minutes before breakfast and dinner.  Continue Linzess 290 mcg daily for constipation.   We will follow up with you after your procedure.  Do not hesitate to call if questions or concerns prior.  Aliene Altes, PA-C [facility]

## 2020-08-31 ENCOUNTER — Encounter: Payer: Self-pay | Admitting: Gastroenterology

## 2020-08-31 NOTE — Assessment & Plan Note (Signed)
Chronic.  Well-controlled on Linzess 290 mcg daily.  Will use MiraLAX if needed.  No alarm symptoms.  Advise she continue her current medications.  Follow-up after colonoscopy due to history of colon polyps as discussed above.

## 2020-08-31 NOTE — Assessment & Plan Note (Addendum)
Resolved after increasing Protonix to 40 mg twice daily.  Suspect patient may have developed some gastritis secondary to prednisone which she continues on due to kidney issues.  I have advised to continue Protonix 40 mg twice daily for now.

## 2020-08-31 NOTE — Assessment & Plan Note (Signed)
74 year old female with history of adenomatous colon polyps.  Last colonoscopy in August 2016 with melanosis coli and colonic diverticulosis with recommendations to repeat colonoscopy in 5 years for surveillance.  She has no significant lower GI symptoms at this time.  Constipation is well managed with Linzess 290 mcg daily.  No alarm symptoms.  Plan: Proceed with colonoscopy with Dr. Gala Romney in the near future. The risks, benefits, and alternatives have been discussed in detail with patient. They have stated understanding and desire to proceed.  ASA III Follow-up after colonoscopy.

## 2020-08-31 NOTE — Assessment & Plan Note (Addendum)
74 year old female reporting return of intermittent RUQ abdominal pain that is occurring daily.  Similar symptoms when diagnosed with pneumonia back in May 2021.  Symptoms resolved but returned about 2 weeks ago.  No association with meals.  Symptoms are worsened by taking a deep breath.  On exam, she has tenderness to palpation along lower right rib cage margin.  Notably, patient had been experiencing nausea and epigastric abdominal pain but these symptoms have essentially resolved after increasing Protonix to twice a day.  Most suspicious for musculoskeletal etiology, possible costochondritis.  Cannot rule out potential early development of pneumonia as she had similar symptoms back in May although lungs are clear to auscultation bilaterally today.  Symptoms do not seem consistent with biliary etiology. Doubt PUD.  Plan: Recommended she try tylenol for your pain.  May take 500 mg 4 times daily.   Recommended she avoid ibuprofen for now due to kidney trouble.  Follow-up with PCP if symptoms do not improve or if she develop shortness of breath, cough, or fever.  If symptoms do not improve, will need to consider further GI evaluation. Plan to follow-up in the office after colonoscopy.

## 2020-08-31 NOTE — Assessment & Plan Note (Addendum)
Well-controlled on Protonix 40 mg twice daily.  No alarm symptoms. Of note, Protonix was increased to twice daily back in July due to report of intermittent epigastric pain and nausea.  The symptoms have resolved.  However, suspect symptoms may have been secondary to gastritis in setting of prednisone which she continues to take for now due to kidney issues.  Advise she continue her current medications.  Follow-up after colonoscopy for history of colon polyps as discussed below.

## 2020-09-01 ENCOUNTER — Other Ambulatory Visit: Payer: Self-pay

## 2020-09-01 ENCOUNTER — Ambulatory Visit (INDEPENDENT_AMBULATORY_CARE_PROVIDER_SITE_OTHER): Payer: Medicare Other | Admitting: Ophthalmology

## 2020-09-01 ENCOUNTER — Encounter (INDEPENDENT_AMBULATORY_CARE_PROVIDER_SITE_OTHER): Payer: Self-pay | Admitting: Ophthalmology

## 2020-09-01 DIAGNOSIS — H35712 Central serous chorioretinopathy, left eye: Secondary | ICD-10-CM

## 2020-09-01 NOTE — Assessment & Plan Note (Signed)
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  Spontaneous improvement continues will thus observe and follow-up in 6 weeks

## 2020-09-01 NOTE — Progress Notes (Signed)
09/01/2020     CHIEF COMPLAINT Patient presents for Retina Follow Up   HISTORY OF PRESENT ILLNESS: Cheryl [name] is a 74 y.o. female who presents to the clinic today for:   HPI    Retina Follow Up    Diagnosis: Acute Serous Retinopathy.  In left eye.  Severity is moderate.  Duration of 8.  Since onset it is stable.  I, the attending physician,  performed the HPI with the patient and updated documentation appropriately.          Comments    8 Week f\u OS. OCT  Pt states some days vision is good and other days it is bad.       Last edited by Tilda Franco on 09/01/2020  9:34 AM. (History)      Referring physician: Sharilyn Sites, MD [address] [place],  [place] [address]  HISTORICAL INFORMATION:   Selected notes from the [facility]: No current outpatient medications on file. (Ophthalmic Drugs)   No current facility-administered medications for this visit. (Ophthalmic Drugs)   Current Outpatient Medications (Other)  Medication Sig  . acetaminophen (TYLENOL) 500 MG tablet Take 500-1,000 mg by mouth as needed (for pain/headache).   Marland Kitchen albuterol (VENTOLIN HFA) 108 (90 Base) MCG/ACT inhaler Inhale 2 puffs into the lungs every 6 (six) hours as needed for wheezing or shortness of breath.  Marland Kitchen buPROPion (WELLBUTRIN XL) 300 MG 24 hr tablet Take 300 mg by mouth daily.  Marland Kitchen CALCIUM-VITAMIN D PO Take 5 mLs by mouth every morning.   . Cholecalciferol (VITAMIN D3) 125 MCG (5000 UT) CAPS Take 1 capsule by mouth daily.  . cyclophosphamide (CYTOXAN) 25 MG capsule Take by mouth daily. Takes 3 tablets by mouth daily.  . diclofenac sodium (VOLTAREN) 1 % GEL Apply 2-4 g topically 4 (four) times daily. (Patient taking differently: Apply 2-4 g topically 4 (four) times daily as needed (for pain). )  . furosemide (LASIX) 80 MG tablet 3 (three) times a week.  Marland Kitchen guaiFENesin (MUCINEX) 600 MG 12 hr tablet Take 1 tablet (600 mg total) by mouth 2 (two)  times daily. (Patient taking differently: Take 600 mg by mouth 2 (two) times daily as needed for cough or to loosen phlegm. )  . linaclotide (LINZESS) 290 MCG CAPS capsule Take 1 capsule (290 mcg total) by mouth daily before breakfast.  . montelukast (SINGULAIR) 10 MG tablet Take 1 tablet (10 mg total) by mouth every morning.  . nystatin (MYCOSTATIN) 100000 UNIT/ML suspension Take 5 mLs by mouth 2 (two) times daily as needed (for thrush).   . ondansetron (ZOFRAN) 4 MG tablet Take 4 mg by mouth as needed for nausea or vomiting.  (Patient not taking: Reported on 08/31/2020)  . pantoprazole (PROTONIX) 40 MG tablet Take 1 tablet (40 mg total) by mouth 2 (two) times daily before a meal.  . polyethylene glycol (MIRALAX / GLYCOLAX) packet Take 17 g by mouth daily as needed for mild constipation or moderate constipation.   . predniSONE (DELTASONE) 20 MG tablet Take 25 mg by mouth daily.   . raloxifene (EVISTA) 60 MG tablet Take 60 mg by mouth daily.  Marland Kitchen rOPINIRole (REQUIP) 0.25 MG tablet Take 0.75 mg by mouth daily as needed (for rls).   . tolterodine (DETROL) 1 MG tablet Take 1 mg by mouth 2 (two) times daily.  . vitamin B-12 (CYANOCOBALAMIN) 1000 MCG tablet Take 1,000 mcg by mouth daily.   No  current facility-administered medications for this visit. (Other)      REVIEW OF SYSTEMS:    ALLERGIES Allergies  Allergen Reactions  . Prednisone Other (See Comments)    Hallucinations   . Tramadol Nausea Only  . Aspirin Nausea And Vomiting  . Celecoxib Rash    PAST MEDICAL HISTORY Past Medical History:  Diagnosis Date  . Acid reflux   . Arthritis   . CKD (chronic kidney disease)   . Constipation   . GERD (gastroesophageal reflux disease)   . Neuromuscular disorder ([place])   . PUD (peptic ulcer disease) 2017   gastric ulcer healed on repeat EGD in August 2017  . Sinus drainage   . Sleep apnea    had surgery to correct   Past Surgical History:  Procedure Laterality Date  . ANTERIOR  CERVICAL DECOMP/DISCECTOMY FUSION N/A 11/19/2016   Procedure: ANTERIOR CERVICAL DISCECTOMY AND FUSION C5-6 WITH PLATES, SCREWS, CAGE, VIVIGEN II;  Surgeon: Jessy Oto, MD;  Location: [facility];  Service: Orthopedics;  Laterality: N/A;  . CARPAL TUNNEL RELEASE    . CATARACT EXTRACTION, BILATERAL Bilateral 2017   One in December 2017 and one in January 2018  . COLONOSCOPY  02/15/2005   [facility]:HWKGS small polyps ablated via cold biopsy, one from transverse colon and two from the rectum/Small external hemorrhoids  . COLONOSCOPY  07/10/2010   [facility]:SRPRXY rectum/long redundant colon, polyps in the sigmoid, descending, hepatic flexure/ADENOMATOUS POLYPS. next TCS due 06/2015  . COLONOSCOPY  1995   3 polyps, path revealed chronic colitis  . COLONOSCOPY N/A 08/05/2015   Procedure: COLONOSCOPY;  Surgeon: Daneil Dolin, MD; melanosis coli, colonic diverticulosis. Repeat colonoscopy in 5 years for surveillance.  . ESOPHAGOGASTRODUODENOSCOPY  1995   Gastritis  . ESOPHAGOGASTRODUODENOSCOPY N/A 04/16/2013   RMR: HH  . ESOPHAGOGASTRODUODENOSCOPY N/A 05/13/2016   Procedure: ESOPHAGOGASTRODUODENOSCOPY (EGD);  Surgeon: Daneil Dolin, MD; gastric ulcer and erosions s/p biopsy, erosive gastropathy. Path of reactive and regenerative changes, no H. pylori.  . ESOPHAGOGASTRODUODENOSCOPY N/A 08/18/2016   Procedure: ESOPHAGOGASTRODUODENOSCOPY (EGD);  Surgeon: Daneil Dolin, MD; normal esophagus, previous gastric ulcer completely healed, small hiatal hernia, normal duodenum.  Marland Kitchen KNEE SURGERY     x2  . ROTATOR CUFF REPAIR     x2  . SHOULDER ARTHROSCOPY WITH ROTATOR CUFF REPAIR AND SUBACROMIAL DECOMPRESSION Right 01/21/2015   Procedure: RIGHT SHOULDER ARTHROSCOPIC DEBRIDEMNT OF G-H JOINT AND REMOVAL OF LOOSE BODIES,ARTHROSCOPIC SUBACROMIAL DECOMPRESSION,MINI OPEN RCT REPAIR WITH SUPPLEMENTAL [facility] PATCH;  Surgeon: Garald Balding, MD;  Location: [facility];  Service: Orthopedics;  Laterality: Right;  . TONSILLECTOMY    .  TRIGGER FINGER RELEASE    . UVULOPALATOPHARYNGOPLASTY      FAMILY HISTORY Family History  Problem Relation Age of Onset  . Cancer Mother   . Cancer Father   . Diabetes Sister   . Colon cancer Neg Hx   . Liver disease Neg Hx     SOCIAL HISTORY Social History   Tobacco Use  . Smoking status: Former Smoker    Packs/day: 0.10    Years: 48.00    Pack years: 4.80    Types: Cigarettes    Quit date: 05/11/2005    Years since quitting: 15.3  . Smokeless tobacco: Never Used  Substance Use Topics  . Alcohol use: No    Alcohol/week: 0.0 standard drinks  . Drug use: No         OPHTHALMIC EXAM: Base Eye Exam    Visual Acuity (Snellen - Linear)  Right Left   Dist cc 20/20 20/30 -1   Dist ph cc  NI   Correction: Glasses       Tonometry (Tonopen, 9:38 AM)      Right Left   Pressure 15 18       Pupils      Pupils Dark Light Shape React APD   Right PERRL 4 3 Round Brisk None   Left PERRL 4 3 Round Brisk None       Visual Fields (Counting fingers)      Left Right    Full Full       Neuro/Psych    Oriented x3: Yes   Mood/Affect: Normal       Dilation    Left eye: 1.0% Mydriacyl, 2.5% Phenylephrine @ 9:38 AM        Slit Lamp and Fundus Exam    External Exam      Right Left   External Normal Normal       Slit Lamp Exam      Right Left   Lids/Lashes Normal Normal   Conjunctiva/Sclera White and quiet White and quiet   Cornea Clear Clear   Anterior Chamber Deep and quiet Deep and quiet   Iris Round and reactive Round and reactive   Lens Posterior chamber intraocular lens Posterior chamber intraocular lens   Anterior Vitreous Normal Normal       Fundus Exam      Right Left   Posterior Vitreous  Normal   Disc  Normal   C/D Ratio  0.5   Macula  Macular thickening, Retinal pigment epithelial detachment,, subretinal fluid inferior to the fovea.  Subretinal white fibrotic area corresponds with the RPE detachment noted on OCT temporal to the fovea.    Vessels  Normal   Periphery  Normal          IMAGING AND PROCEDURES  Imaging and Procedures for 09/01/20  OCT, Retina - OU - Both Eyes       Right Eye Quality was good. Scan locations included subfoveal. Central Foveal Thickness: 257. Progression has been stable. Findings include vitreomacular adhesion , abnormal foveal contour.   Left Eye Quality was good. Scan locations included subfoveal. Central Foveal Thickness: 320. Progression has improved. Findings include pigment epithelial detachment, subretinal fluid, vitreomacular adhesion .   Notes OD with minor inner retinal foveal distortion from vitreal macular adhesion  Left eye also with vitreal macular adhesion Abrol pattern, yet much less subretinal fluid and subretinal hyper reflective material overlying the pigment epithelial detachment likely signifying improved central serous retinopathy associated with pigment epithelial detachment.  We will continue to observe as this is spontaneously improving at 6-week interval.                ASSESSMENT/PLAN:  Acute central serous retinopathy with subretinal fluid, left Spontaneous resolution of central serous retinopathy left eye concomitant with a tapering dose of oral systemic steroids.  Spontaneous improvement continues will thus observe and follow-up in 6 weeks      ICD-10-CM   1. Acute central serous retinopathy with subretinal fluid, left  H35.712 OCT, Retina - OU - Both Eyes    1.  OS continued improvement of central serous retinopathy as patients tapers her systemic p.o. steroids.  Spontaneous resolution suggest that this is in fact CS CR and not wet ARMD  2.  Follow-up in 6 weeks  3.  Ophthalmic Meds Ordered this visit:  No orders of the defined types were placed in  this encounter.      Return in about 6 weeks (around 10/13/2020) for dilate, OCT.  There are no Patient Instructions on file for this visit.   Explained the diagnoses, plan, and follow up  with the patient and they expressed understanding.  Patient expressed understanding of the importance of proper follow up care.   Clent Demark Dondrea Clendenin M.D. Diseases & Surgery of the Retina and Vitreous Retina & Diabetic [place] 09/01/20     Abbreviations: M myopia (nearsighted); A astigmatism; H hyperopia (farsighted); P presbyopia; Mrx spectacle prescription;  CTL contact lenses; OD right eye; OS left eye; OU both eyes  XT exotropia; ET esotropia; PEK punctate epithelial keratitis; PEE punctate epithelial erosions; DES dry eye syndrome; MGD meibomian gland dysfunction; ATs artificial tears; PFAT's preservative free artificial tears; [place] nuclear sclerotic cataract; PSC posterior subcapsular cataract; ERM epi-retinal membrane; PVD posterior vitreous detachment; RD retinal detachment; DM diabetes mellitus; DR diabetic retinopathy; NPDR non-proliferative diabetic retinopathy; PDR proliferative diabetic retinopathy; CSME clinically significant macular edema; DME diabetic macular edema; dbh dot blot hemorrhages; CWS cotton wool spot; POAG primary open angle glaucoma; C/D cup-to-disc ratio; HVF humphrey visual field; GVF goldmann visual field; OCT optical coherence tomography; IOP intraocular pressure; BRVO Branch retinal vein occlusion; CRVO central retinal vein occlusion; CRAO central retinal artery occlusion; BRAO branch retinal artery occlusion; RT retinal tear; SB scleral buckle; PPV pars plana vitrectomy; VH Vitreous hemorrhage; PRP panretinal laser photocoagulation; IVK intravitreal kenalog; VMT vitreomacular traction; MH Macular hole;  NVD neovascularization of the disc; NVE neovascularization elsewhere; AREDS age related eye disease study; ARMD age related macular degeneration; POAG primary open angle glaucoma; EBMD epithelial/anterior basement membrane dystrophy; ACIOL anterior chamber intraocular lens; IOL intraocular lens; PCIOL posterior chamber intraocular lens; Phaco/IOL phacoemulsification with  intraocular lens placement; [place] photorefractive keratectomy; LASIK laser assisted in situ keratomileusis; HTN hypertension; DM diabetes mellitus; COPD chronic obstructive pulmonary disease

## 2020-09-02 ENCOUNTER — Other Ambulatory Visit (HOSPITAL_COMMUNITY)
Admission: RE | Admit: 2020-09-02 | Discharge: 2020-09-02 | Disposition: A | Discharge status: Home / Self Care | Payer: Medicare Other | Source: Ambulatory Visit | Attending: Family Medicine | Admitting: Family Medicine

## 2020-09-02 ENCOUNTER — Telehealth: Payer: Self-pay

## 2020-09-02 ENCOUNTER — Telehealth: Payer: Self-pay | Admitting: Internal Medicine

## 2020-09-02 DIAGNOSIS — Z20822 Contact with and (suspected) exposure to covid-19: Secondary | ICD-10-CM | POA: Insufficient documentation

## 2020-09-02 DIAGNOSIS — Z01812 Encounter for preprocedural laboratory examination: Secondary | ICD-10-CM | POA: Diagnosis not present

## 2020-09-02 LAB — SARS CORONAVIRUS 2 (TAT 6-24 HRS): SARS Coronavirus 2: NEGATIVE

## 2020-09-02 NOTE — Telephone Encounter (Signed)
Pt called to make sure we had all the medication she's taking is on her medication list. Medications were updated.

## 2020-09-02 NOTE — Telephone Encounter (Signed)
PATIENT CALLED WANTING TO GIVE [place] A LIST OF HER UPDATED MEDICATIONS

## 2020-09-02 NOTE — Telephone Encounter (Signed)
Meds have been updated.

## 2020-09-03 ENCOUNTER — Encounter (HOSPITAL_COMMUNITY): Payer: Self-pay | Admitting: Internal Medicine

## 2020-09-03 ENCOUNTER — Other Ambulatory Visit: Payer: Self-pay

## 2020-09-03 ENCOUNTER — Encounter (HOSPITAL_COMMUNITY)
Admission: RE | Disposition: A | Discharge status: Home / Self Care | Payer: Self-pay | Source: Home / Self Care | Attending: Internal Medicine

## 2020-09-03 ENCOUNTER — Ambulatory Visit (HOSPITAL_COMMUNITY)
Admission: RE | Admit: 2020-09-03 | Discharge: 2020-09-03 | Disposition: A | Discharge status: Home / Self Care | Payer: Medicare Other | Attending: Internal Medicine | Admitting: Internal Medicine

## 2020-09-03 DIAGNOSIS — N189 Chronic kidney disease, unspecified: Secondary | ICD-10-CM | POA: Diagnosis not present

## 2020-09-03 DIAGNOSIS — Z7952 Long term (current) use of systemic steroids: Secondary | ICD-10-CM | POA: Diagnosis not present

## 2020-09-03 DIAGNOSIS — Z1211 Encounter for screening for malignant neoplasm of colon: Secondary | ICD-10-CM | POA: Diagnosis not present

## 2020-09-03 DIAGNOSIS — E875 Hyperkalemia: Secondary | ICD-10-CM | POA: Diagnosis not present

## 2020-09-03 DIAGNOSIS — Z87891 Personal history of nicotine dependence: Secondary | ICD-10-CM | POA: Insufficient documentation

## 2020-09-03 DIAGNOSIS — Z8711 Personal history of peptic ulcer disease: Secondary | ICD-10-CM | POA: Insufficient documentation

## 2020-09-03 DIAGNOSIS — Z7982 Long term (current) use of aspirin: Secondary | ICD-10-CM | POA: Insufficient documentation

## 2020-09-03 DIAGNOSIS — D120 Benign neoplasm of cecum: Secondary | ICD-10-CM | POA: Insufficient documentation

## 2020-09-03 DIAGNOSIS — Z791 Long term (current) use of non-steroidal anti-inflammatories (NSAID): Secondary | ICD-10-CM | POA: Insufficient documentation

## 2020-09-03 DIAGNOSIS — K635 Polyp of colon: Secondary | ICD-10-CM

## 2020-09-03 DIAGNOSIS — G4730 Sleep apnea, unspecified: Secondary | ICD-10-CM | POA: Diagnosis not present

## 2020-09-03 DIAGNOSIS — M1990 Unspecified osteoarthritis, unspecified site: Secondary | ICD-10-CM | POA: Diagnosis not present

## 2020-09-03 DIAGNOSIS — K5730 Diverticulosis of large intestine without perforation or abscess without bleeding: Secondary | ICD-10-CM | POA: Insufficient documentation

## 2020-09-03 DIAGNOSIS — D124 Benign neoplasm of descending colon: Secondary | ICD-10-CM | POA: Diagnosis not present

## 2020-09-03 DIAGNOSIS — Z79899 Other long term (current) drug therapy: Secondary | ICD-10-CM | POA: Insufficient documentation

## 2020-09-03 DIAGNOSIS — K219 Gastro-esophageal reflux disease without esophagitis: Secondary | ICD-10-CM | POA: Insufficient documentation

## 2020-09-03 DIAGNOSIS — Z86010 Personal history of colonic polyps: Secondary | ICD-10-CM | POA: Diagnosis not present

## 2020-09-03 HISTORY — PX: COLONOSCOPY: SHX5424

## 2020-09-03 HISTORY — PX: POLYPECTOMY: SHX5525

## 2020-09-03 SURGERY — COLONOSCOPY
Anesthesia: Moderate Sedation

## 2020-09-03 MED ORDER — MIDAZOLAM HCL 5 MG/5ML IJ SOLN
INTRAMUSCULAR | Status: DC | PRN
  Administered 2020-09-03: 1 mg via INTRAVENOUS
  Administered 2020-09-03: 2 mg via INTRAVENOUS
  Administered 2020-09-03: 1 mg via INTRAVENOUS

## 2020-09-03 MED ORDER — STERILE WATER FOR IRRIGATION IR SOLN
Status: DC | PRN
  Administered 2020-09-03: 1.5 mL

## 2020-09-03 MED ORDER — SODIUM CHLORIDE 0.9 % IV SOLN
INTRAVENOUS | Status: DC
  Administered 2020-09-03: 1000 mL via INTRAVENOUS

## 2020-09-03 MED ORDER — ONDANSETRON HCL 4 MG/2ML IJ SOLN
INTRAMUSCULAR | Status: DC | PRN
  Administered 2020-09-03: 4 mg via INTRAVENOUS

## 2020-09-03 MED ORDER — MEPERIDINE HCL 50 MG/ML IJ SOLN
INTRAMUSCULAR | Status: AC
  Filled 2020-09-03: qty 1

## 2020-09-03 MED ORDER — MIDAZOLAM HCL 5 MG/5ML IJ SOLN
INTRAMUSCULAR | Status: AC
  Filled 2020-09-03: qty 10

## 2020-09-03 MED ORDER — MEPERIDINE HCL 100 MG/ML IJ SOLN
INTRAMUSCULAR | Status: DC | PRN
Start: 2020-09-03 — End: 2020-09-03
  Administered 2020-09-03: 25 mg via INTRAVENOUS
  Administered 2020-09-03: 15 mg via INTRAVENOUS

## 2020-09-03 MED ORDER — ONDANSETRON HCL 4 MG/2ML IJ SOLN
INTRAMUSCULAR | Status: AC
  Filled 2020-09-03: qty 2

## 2020-09-03 NOTE — Op Note (Signed)
[facility] Patient Name: Cheryl [name] Procedure Date: 09/03/2020 10:44 AM MRN: [id number] Date of Birth: Jul 26, 1946 Attending MD: Norvel Richards , MD CSN: [id number] Age: 74 Admit Type: Outpatient Procedure:                Colonoscopy Indications:              High risk colon cancer surveillance: Personal                            history of colonic polyps Providers:                Norvel Richards, MD, [place]is [place] B. Sharon Seller, RN,                            Caprice Kluver, Rosina Lowenstein, RN Referring MD:              Medicines:                Midazolam 4 mg IV, Meperidine 40 mg IV Complications:            No immediate complications. Estimated Blood Loss:     Estimated blood loss was minimal. Procedure:                Pre-Anesthesia Assessment:                           - Prior to the procedure, a History and Physical                            was performed, and patient medications and                            allergies were reviewed. The patient's tolerance of                            previous anesthesia was also reviewed. The risks                            and benefits of the procedure and the sedation                            options and risks were discussed with the patient.                            All questions were answered, and informed consent                            was obtained. Prior Anticoagulants: The patient has                            taken no previous anticoagulant or antiplatelet                            agents. ASA Grade Assessment: III - A patient with  severe systemic disease. After reviewing the risks                            and benefits, the patient was deemed in                            satisfactory condition to undergo the procedure.                           After obtaining informed consent, the colonoscope                            was passed under direct vision. Throughout the                             procedure, the patient's blood pressure, pulse, and                            oxygen saturations were monitored continuously. The                            CF-HQ190L ([id number]) scope was introduced through                            the anus and advanced to the the cecum, identified                            by appendiceal orifice and ileocecal valve. The                            colonoscopy was performed without difficulty. The                            patient tolerated the procedure well. The quality                            of the bowel preparation was adequate. Scope In: 11:10:23 AM Scope Out: 11:39:53 AM Scope Withdrawal Time: 0 hours 18 minutes 21 seconds  Total Procedure Duration: 0 hours 29 minutes 30 seconds  Findings:      The perianal and digital rectal examinations were normal.      Seven semi-pedunculated polyps were found in the descending colon and       cecum. The polyps were 2 to 7 mm in size. These polyps were removed with       a cold snare. Resection and retrieval were complete. Estimated blood       loss was minimal.      Scattered small-mouthed diverticula were found in the entire colon.       Redundant colon. Changing of the patient's position and external       abdominal pressure required to reach the cecum.      The exam was otherwise without abnormality on direct and retroflexion       views. Impression:               - Seven 2 to 7 mm polyps  in the descending colon                            and in the cecum, removed with a cold snare.                            Resected and retrieved.                           - Diverticulosis in the entire examined colon.                            Redundant colon.                           - The examination was otherwise normal on direct                            and retroflexion views. Moderate Sedation:      Moderate (conscious) sedation was administered by the endoscopy nurse       and supervised by the  endoscopist. The following parameters were       monitored: oxygen saturation, heart rate, blood pressure, respiratory       rate, EKG, adequacy of pulmonary ventilation, and response to care.       Total physician intraservice time was 32 minutes. Recommendation:           - Patient has a contact number available for                            emergencies. The signs and symptoms of potential                            delayed complications were discussed with the                            patient. Return to normal activities tomorrow.                            Written discharge instructions were provided to the                            patient.                           - Resume previous diet.                           - Continue present medications.                           - Await pathology results.                           - Repeat colonoscopy in 5 years for surveillance  based on pathology results.                           - Return to GI office 3 months Procedure Code(s):        --- Professional ---                           [phone], Colonoscopy, flexible; with removal of                            tumor(s), polyp(s), or other lesion(s) by snare                            technique                           [id number], Moderate sedation; each additional 15                            minutes intraservice time                           G0500, Moderate sedation services provided by the                            same physician or other qualified health care                            professional performing a gastrointestinal                            endoscopic service that sedation supports,                            requiring the presence of an independent trained                            observer to assist in the monitoring of the                            patient's level of consciousness and physiological                            status; initial 15  minutes of intra-service time;                            patient age 67 years or older (additional time may                            be reported with [phone], as appropriate) Diagnosis Code(s):        --- Professional ---                           Z86.010, Personal history of colonic polyps  K63.5, Polyp of colon                           K57.30, Diverticulosis of large intestine without                            perforation or abscess without bleeding CPT copyright 2019 [employer]. All rights reserved. The codes documented in this report are preliminary and upon coder review may  be revised to meet current compliance requirements. Cristopher Estimable. Thomasine Klutts, MD Norvel Richards, MD 09/03/2020 11:48:32 AM This report has been signed electronically. Number of Addenda: 0

## 2020-09-03 NOTE — Interval H&P Note (Signed)
History and Physical Interval Note:  09/03/2020 11:01 AM  Cheryl [name]  has presented today for surgery, with the diagnosis of h/o colon polyps.  The various methods of treatment have been discussed with the patient and family. After consideration of risks, benefits and other options for treatment, the patient has consented to  Procedure(s) with comments: COLONOSCOPY (N/A) - 11:15am as a surgical intervention.  The patient's history has been reviewed, patient examined, no change in status, stable for surgery.  I have reviewed the patient's chart and labs.  Questions were answered to the patient's satisfaction.     Cheryl [name]  No change.  Surveillance colonoscopy per plan today  The risks, benefits, limitations, alternatives and imponderables have been reviewed with the patient. Questions have been answered. All parties are agreeable.

## 2020-09-03 NOTE — Discharge Instructions (Signed)
Colonoscopy Discharge Instructions  Read the instructions outlined below and refer to this sheet in the next few weeks. These discharge instructions provide you with general information on caring for yourself after you leave the hospital. Your doctor may also give you specific instructions. While your treatment has been planned according to the most current medical practices available, unavoidable complications occasionally occur. If you have any problems or questions after discharge, call Dr. Gala Romney at [phone]. ACTIVITY  You may resume your regular activity, but move at a slower pace for the next 24 hours.   Take frequent rest periods for the next 24 hours.   Walking will help get rid of the air and reduce the bloated feeling in your belly (abdomen).   No driving for 24 hours (because of the medicine (anesthesia) used during the test).    Do not sign any important legal documents or operate any machinery for 24 hours (because of the anesthesia used during the test).  NUTRITION  Drink plenty of fluids.   You may resume your normal diet as instructed by your doctor.   Begin with a light meal and progress to your normal diet. Heavy or fried foods are harder to digest and may make you feel sick to your stomach (nauseated).   Avoid alcoholic beverages for 24 hours or as instructed.  MEDICATIONS  You may resume your normal medications unless your doctor tells you otherwise.  WHAT YOU CAN EXPECT TODAY  Some feelings of bloating in the abdomen.   Passage of more gas than usual.   Spotting of blood in your stool or on the toilet paper.  IF YOU HAD POLYPS REMOVED DURING THE COLONOSCOPY:  No aspirin products for 7 days or as instructed.   No alcohol for 7 days or as instructed.   Eat a soft diet for the next 24 hours.  FINDING OUT THE RESULTS OF YOUR TEST Not all test results are available during your visit. If your test results are not back during the visit, make an appointment  with your caregiver to find out the results. Do not assume everything is normal if you have not heard from your caregiver or the medical facility. It is important for you to follow up on all of your test results.  SEEK IMMEDIATE MEDICAL ATTENTION IF:  You have more than a spotting of blood in your stool.   Your belly is swollen (abdominal distention).   You are nauseated or vomiting.   You have a temperature over 101.   You have abdominal pain or discomfort that is severe or gets worse throughout the day.   Diverticulosis and colon polyp information provided  7 polyps removed from your colon today  Further recommendations to follow pending review of pathology report  At patient request, I called Mason Jim at [phone] -discussed results  PATIENT INSTRUCTIONS POST-ANESTHESIA  IMMEDIATELY FOLLOWING SURGERY:  Do not drive or operate machinery for the first twenty four hours after surgery.  Do not make any important decisions for twenty four hours after surgery or while taking narcotic pain medications or sedatives.  If you develop intractable nausea and vomiting or a severe headache please notify your doctor immediately.  FOLLOW-UP:  Please make an appointment with your surgeon as instructed. You do not need to follow up with anesthesia unless specifically instructed to do so.  WOUND CARE INSTRUCTIONS (if applicable):  Keep a dry clean dressing on the anesthesia/puncture wound site if there is drainage.  Once the wound has quit  draining you may leave it open to air.  Generally you should leave the bandage intact for twenty four hours unless there is drainage.  If the epidural site drains for more than 36-48 hours please call the anesthesia department.  QUESTIONS?:  Please feel free to call your physician or the hospital operator if you have any questions, and they will be happy to assist you.      Colonoscopy, Adult, Care After This sheet gives you information about how to care for  yourself after your procedure. Your doctor may also give you more specific instructions. If you have problems or questions, call your doctor. What can I expect after the procedure? After the procedure, it is common to have:  A small amount of blood in your poop (stool) for 24 hours.  Some gas.  Mild cramping or bloating in your belly (abdomen). Follow these instructions at home: Eating and drinking   Drink enough fluid to keep your pee (urine) pale yellow.  Follow instructions from your doctor about what you cannot eat or drink.  Return to your normal diet as told by your doctor. Avoid heavy or fried foods that are hard to digest. Activity  Rest as told by your doctor.  Do not sit for a long time without moving. Get up to take short walks every 1-2 hours. This is important. Ask for help if you feel weak or unsteady.  Return to your normal activities as told by your doctor. Ask your doctor what activities are safe for you. To help cramping and bloating:   Try walking around.  Put heat on your belly as told by your doctor. Use the heat source that your doctor recommends, such as a moist heat pack or a heating pad. ? Put a towel between your skin and the heat source. ? Leave the heat on for 20-30 minutes. ? Remove the heat if your skin turns bright red. This is very important if you are unable to feel pain, heat, or cold. You may have a greater risk of getting burned. General instructions  For the first 24 hours after the procedure: ? Do not drive or use machinery. ? Do not sign important documents. ? Do not drink alcohol. ? Do your daily activities more slowly than normal. ? Eat foods that are soft and easy to digest.  Take over-the-counter or prescription medicines only as told by your doctor.  Keep all follow-up visits as told by your doctor. This is important. Contact a doctor if:  You have blood in your poop 2-3 days after the procedure. Get help right away  if:  You have more than a small amount of blood in your poop.  You see large clumps of tissue (blood clots) in your poop.  Your belly is swollen.  You feel like you may vomit (nauseous).  You vomit.  You have a fever.  You have belly pain that gets worse, and medicine does not help your pain. Summary  After the procedure, it is common to have a small amount of blood in your poop. You may also have mild cramping and bloating in your belly.  For the first 24 hours after the procedure, do not drive or use machinery, do not sign important documents, and do not drink alcohol.  Get help right away if you have a lot of blood in your poop, feel like you may vomit, have a fever, or have more belly pain. This information is not intended to replace advice given  to you by your health care provider. Make sure you discuss any questions you have with your health care provider. Document Revised: 07/02/2019 Document Reviewed: 07/02/2019 Elsevier Patient Education  [place].    Colon Polyps  Polyps are tissue growths inside the body. Polyps can grow in many places, including the large intestine (colon). A polyp may be a round bump or a mushroom-shaped growth. You could have one polyp or several. Most colon polyps are noncancerous (benign). However, some colon polyps can become cancerous over time. Finding and removing the polyps early can help prevent this. What are the causes? The exact cause of colon polyps is not known. What increases the risk? You are more likely to develop this condition if you:  Have a family history of colon cancer or colon polyps.  Are older than 69 or older than 45 if you are African American.  Have inflammatory bowel disease, such as ulcerative colitis or Crohn's disease.  Have certain hereditary conditions, such as: ? Familial adenomatous polyposis. ? Lynch syndrome. ? Turcot syndrome. ? Peutz-Jeghers syndrome.  Are overweight.  Smoke  cigarettes.  Do not get enough exercise.  Drink too much alcohol.  Eat a diet that is high in fat and red meat and low in fiber.  Had childhood cancer that was treated with abdominal radiation. What are the signs or symptoms? Most polyps do not cause symptoms. If you have symptoms, they may include:  Blood coming from your rectum when having a bowel movement.  Blood in your stool. The stool may look dark red or black.  Abdominal pain.  A change in bowel habits, such as constipation or diarrhea. How is this diagnosed? This condition is diagnosed with a colonoscopy. This is a procedure in which a lighted, flexible scope is inserted into the anus and then passed into the colon to examine the area. Polyps are sometimes found when a colonoscopy is done as part of routine cancer screening tests. How is this treated? Treatment for this condition involves removing any polyps that are found. Most polyps can be removed during a colonoscopy. Those polyps will then be tested for cancer. Additional treatment may be needed depending on the results of testing. Follow these instructions at home: Lifestyle  Maintain a healthy weight, or lose weight if recommended by your health care provider.  Exercise every day or as told by your health care provider.  Do not use any products that contain nicotine or tobacco, such as cigarettes and e-cigarettes. If you need help quitting, ask your health care provider.  If you drink alcohol, limit how much you have: ? 0-1 drink a day for women. ? 0-2 drinks a day for men.  Be aware of how much alcohol is in your drink. In the [place] one drink equals one 12 oz bottle of beer (355 mL), one 5 oz glass of wine (148 mL), or one 1 oz shot of hard liquor (44 mL). Eating and drinking   Eat foods that are high in fiber, such as fruits, vegetables, and whole grains.  Eat foods that are high in calcium and vitamin D, such as milk, cheese, yogurt, eggs, liver, fish,  and broccoli.  Limit foods that are high in fat, such as fried foods and desserts.  Limit the amount of red meat and processed meat you eat, such as hot dogs, sausage, bacon, and lunch meats. General instructions  Keep all follow-up visits as told by your health care provider. This is important. ? This  includes having regularly scheduled colonoscopies. ? Talk to your health care provider about when you need a colonoscopy. Contact a health care provider if:  You have new or worsening bleeding during a bowel movement.  You have new or increased blood in your stool.  You have a change in bowel habits.  You lose weight for no known reason. Summary  Polyps are tissue growths inside the body. Polyps can grow in many places, including the colon.  Most colon polyps are noncancerous (benign), but some can become cancerous over time.  This condition is diagnosed with a colonoscopy.  Treatment for this condition involves removing any polyps that are found. Most polyps can be removed during a colonoscopy. This information is not intended to replace advice given to you by your health care provider. Make sure you discuss any questions you have with your health care provider. Document Revised: 03/23/2018 Document Reviewed: 03/23/2018 Elsevier Patient Education  [place].    Diverticulosis  Diverticulosis is a condition that develops when small pouches (diverticula) form in the wall of the large intestine (colon). The colon is where water is absorbed and stool (feces) is formed. The pouches form when the inside layer of the colon pushes through weak spots in the outer layers of the colon. You may have a few pouches or many of them. The pouches usually do not cause problems unless they become inflamed or infected. When this happens, the condition is called diverticulitis. What are the causes? The cause of this condition is not known. What increases the risk? The following factors  may make you more likely to develop this condition:  Being older than age 29. Your risk for this condition increases with age. Diverticulosis is rare among people younger than age 50. By age 34, many people have it.  Eating a low-fiber diet.  Having frequent constipation.  Being overweight.  Not getting enough exercise.  Smoking.  Taking over-the-counter pain medicines, like aspirin and ibuprofen.  Having a family history of diverticulosis. What are the signs or symptoms? In most people, there are no symptoms of this condition. If you do have symptoms, they may include:  Bloating.  Cramps in the abdomen.  Constipation or diarrhea.  Pain in the lower left side of the abdomen. How is this diagnosed? Because diverticulosis usually has no symptoms, it is most often diagnosed during an exam for other colon problems. The condition may be diagnosed by:  Using a flexible scope to examine the colon (colonoscopy).  Taking an X-ray of the colon after dye has been put into the colon (barium enema).  Having a CT scan. How is this treated? You may not need treatment for this condition. Your health care provider may recommend treatment to prevent problems. You may need treatment if you have symptoms or if you previously had diverticulitis. Treatment may include:  Eating a high-fiber diet.  Taking a fiber supplement.  Taking a live bacteria supplement (probiotic).  Taking medicine to relax your colon. Follow these instructions at home: Medicines  Take over-the-counter and prescription medicines only as told by your health care provider.  If told by your health care provider, take a fiber supplement or probiotic. Constipation prevention Your condition may cause constipation. To prevent or treat constipation, you may need to:  Drink enough fluid to keep your urine pale yellow.  Take over-the-counter or prescription medicines.  Eat foods that are high in fiber, such as beans,  whole grains, and fresh fruits and vegetables.  Limit  foods that are high in fat and processed sugars, such as fried or sweet foods.  General instructions  Try not to strain when you have a bowel movement.  Keep all follow-up visits as told by your health care provider. This is important. Contact a health care provider if you:  Have pain in your abdomen.  Have bloating.  Have cramps.  Have not had a bowel movement in 3 days. Get help right away if:  Your pain gets worse.  Your bloating becomes very bad.  You have a fever or chills, and your symptoms suddenly get worse.  You vomit.  You have bowel movements that are bloody or black.  You have bleeding from your rectum. Summary  Diverticulosis is a condition that develops when small pouches (diverticula) form in the wall of the large intestine (colon).  You may have a few pouches or many of them.  This condition is most often diagnosed during an exam for other colon problems.  Treatment may include increasing the fiber in your diet, taking supplements, or taking medicines. This information is not intended to replace advice given to you by your health care provider. Make sure you discuss any questions you have with your health care provider. Document Revised: 07/05/2019 Document Reviewed: 07/05/2019 Elsevier Patient Education  [place].

## 2020-09-04 LAB — SURGICAL PATHOLOGY

## 2020-09-05 ENCOUNTER — Encounter: Payer: Self-pay | Admitting: Internal Medicine

## 2020-09-08 DIAGNOSIS — N057 Unspecified nephritic syndrome with diffuse crescentic glomerulonephritis: Secondary | ICD-10-CM | POA: Diagnosis not present

## 2020-09-08 DIAGNOSIS — D84821 Immunodeficiency due to drugs: Secondary | ICD-10-CM | POA: Diagnosis not present

## 2020-09-08 DIAGNOSIS — E8770 Fluid overload, unspecified: Secondary | ICD-10-CM | POA: Diagnosis not present

## 2020-09-08 DIAGNOSIS — I129 Hypertensive chronic kidney disease with stage 1 through stage 4 chronic kidney disease, or unspecified chronic kidney disease: Secondary | ICD-10-CM | POA: Diagnosis not present

## 2020-09-08 DIAGNOSIS — E875 Hyperkalemia: Secondary | ICD-10-CM | POA: Diagnosis not present

## 2020-09-08 DIAGNOSIS — N189 Chronic kidney disease, unspecified: Secondary | ICD-10-CM | POA: Diagnosis not present

## 2020-09-08 DIAGNOSIS — E872 Acidosis: Secondary | ICD-10-CM | POA: Diagnosis not present

## 2020-09-08 DIAGNOSIS — N058 Unspecified nephritic syndrome with other morphologic changes: Secondary | ICD-10-CM | POA: Diagnosis not present

## 2020-09-08 DIAGNOSIS — H538 Other visual disturbances: Secondary | ICD-10-CM | POA: Diagnosis not present

## 2020-09-08 DIAGNOSIS — N179 Acute kidney failure, unspecified: Secondary | ICD-10-CM | POA: Diagnosis not present

## 2020-09-08 DIAGNOSIS — N2581 Secondary hyperparathyroidism of renal origin: Secondary | ICD-10-CM | POA: Diagnosis not present

## 2020-09-08 DIAGNOSIS — D631 Anemia in chronic kidney disease: Secondary | ICD-10-CM | POA: Diagnosis not present

## 2020-09-09 ENCOUNTER — Encounter (HOSPITAL_COMMUNITY): Payer: Self-pay | Admitting: Internal Medicine

## 2020-09-15 ENCOUNTER — Encounter: Payer: Self-pay | Admitting: Internal Medicine

## 2020-09-15 ENCOUNTER — Ambulatory Visit (INDEPENDENT_AMBULATORY_CARE_PROVIDER_SITE_OTHER): Payer: Medicare Other | Admitting: Internal Medicine

## 2020-09-15 ENCOUNTER — Other Ambulatory Visit: Payer: Self-pay

## 2020-09-15 VITALS — BP 118/70 | HR 85 | Ht 59.0 in | Wt 199.0 lb

## 2020-09-15 DIAGNOSIS — E063 Autoimmune thyroiditis: Secondary | ICD-10-CM

## 2020-09-15 LAB — T3, FREE: T3, Free: 2.3 pg/mL (ref 2.3–4.2)

## 2020-09-15 LAB — TSH: TSH: 2.12 u[IU]/mL (ref 0.35–4.50)

## 2020-09-15 LAB — T4, FREE: Free T4: 0.93 ng/dL (ref 0.60–1.60)

## 2020-09-15 NOTE — Patient Instructions (Signed)
Please stop at the lab.  Please come back for a follow-up appointment in 1 year.  

## 2020-09-15 NOTE — Progress Notes (Signed)
Patient ID: Cheryl [name], female   DOB: Jun 10, 1946, 74 y.o.   MRN: [id number]  This visit occurred during the SARS-CoV-2 public health emergency.  Safety protocols were in place, including screening questions prior to the visit, additional usage of staff PPE, and extensive cleaning of exam room while observing appropriate contact time as indicated for disinfecting solutions.   HPI  Cheryl [name] is a 74 y.o.-year-old female, initially referred by her PCP,  Dr. Micheline Rough, returning for f/u for euthyroid Hashimoto's thyroiditis. Last visit 1 year and 3 months ago (virtual).  Since last visit, she was admitted with acute respiratory failure and pneumonia 03/2020 and Acute kidney failure 04/2020.  She was started on Prednisone 15 mg now for 1 week. She is on a taper.   She was also diagnosed with renal failure: Lab Results  Component Value Date   BUN 62 (H) 08/27/2020   Lab Results  Component Value Date   CREATININE 3.55 (H) 08/27/2020   She was diagnosed with euthyroid Hashimoto's thyroiditis in 10/2015.  Reviewed her TFTs: Lab Results  Component Value Date   TSH 1.25 06/14/2019   TSH 2.09 06/09/2018   TSH 1.54 02/25/2017   TSH 1.69 11/02/2016   TSH 2.82 02/27/2016   FREET4 0.84 06/14/2019   FREET4 0.85 06/09/2018   FREET4 0.85 02/25/2017   FREET4 1.08 11/02/2016   FREET4 1.03 02/27/2016   T3FREE 3.4 06/14/2019   T3FREE 3.9 06/09/2018   T3FREE 3.6 02/25/2017   T3FREE 3.9 11/02/2016   T3FREE 4.2 02/27/2016   Her TPO antibodies were elevated, but improved at last check: Component     Latest Ref Rng & Units 06/09/2018  Thyroperoxidase Ab SerPl-aCnc     <9 IU/mL 109 (H)   10/21/2015:  - TSH 1.020, free T3 2.0 (2-4.4) - TPO antibodies 159 (0-34)  She continues to describe arthritis pain, sinus congestion, fatigue, heat intolerance (chronic, since the 1990s when she was hospitalized with an undefined connective tissue disease) but hot flashes improved with before last  visit, also constipation (on Linzess), hair loss (saw dermatology)-improved. She also describes fatigue.  Pt denies: - feeling nodules in neck - hoarseness - dysphagia - choking - SOB with lying down  She has + FH of thyroid disorders in: sisters, mother. No FH of thyroid cancer. No h/o radiation tx to head or neck.  No seaweed or kelp. No recent contrast studies. No herbal supplements. Stopped Biotin.   She also has a history of OSA >> had surgery for it, osteopenia  (on Raloxifene), gout.  ROS: Constitutional: no weight gain/no weight loss, + fatigue, no subjective hyperthermia, no subjective hypothermia Eyes: no blurry vision, no xerophthalmia ENT: no sore throat, + see HPI Cardiovascular: no CP/no SOB/no palpitations/no leg swelling Respiratory: no cough/no SOB/no wheezing Gastrointestinal: no N/no V/no D/+ C/no acid reflux Musculoskeletal: + muscle aches/+ joint aches Skin: no rashes, + improved hair loss Neurological: no tremors/no numbness/no tingling/no dizziness  I reviewed pt's medications, allergies, PMH, social hx, family hx, and changes were documented in the history of present illness. Otherwise, unchanged from my initial visit note.  Past Medical History:  Diagnosis Date  . Acid reflux   . Arthritis   . CKD (chronic kidney disease)   . Constipation   . GERD (gastroesophageal reflux disease)   . Neuromuscular disorder ([place])   . PUD (peptic ulcer disease) 2017   gastric ulcer healed on repeat EGD in August 2017  . Sinus drainage   . Sleep  apnea    had surgery to correct   Past Surgical History:  Procedure Laterality Date  . ANTERIOR CERVICAL DECOMP/DISCECTOMY FUSION N/A 11/19/2016   Procedure: ANTERIOR CERVICAL DISCECTOMY AND FUSION C5-6 WITH PLATES, SCREWS, CAGE, VIVIGEN II;  Surgeon: Jessy Oto, MD;  Location: [place];  Service: Orthopedics;  Laterality: N/A;  . CARPAL TUNNEL RELEASE    . CATARACT EXTRACTION, BILATERAL Bilateral 2017   One in December  2017 and one in January 2018  . COLONOSCOPY  02/15/2005   [id number] small polyps ablated via cold biopsy, one from transverse colon and two from the rectum/Small external hemorrhoids  . COLONOSCOPY  07/10/2010   JXB:[id number] rectum/long redundant colon, polyps in the sigmoid, descending, hepatic flexure/ADENOMATOUS POLYPS. next TCS due 06/2015  . COLONOSCOPY  1995   3 polyps, path revealed chronic colitis  . COLONOSCOPY N/A 08/05/2015   Procedure: COLONOSCOPY;  Surgeon: Daneil Dolin, MD; melanosis coli, colonic diverticulosis. Repeat colonoscopy in 5 years for surveillance.  . COLONOSCOPY N/A 09/03/2020   Procedure: COLONOSCOPY;  Surgeon: Daneil Dolin, MD;  [facility];  Service: Endoscopy;  Laterality: N/A;  11:15am  . ESOPHAGOGASTRODUODENOSCOPY  1995   Gastritis  . ESOPHAGOGASTRODUODENOSCOPY N/A 04/16/2013   RMR: HH  . ESOPHAGOGASTRODUODENOSCOPY N/A 05/13/2016   Procedure: ESOPHAGOGASTRODUODENOSCOPY (EGD);  Surgeon: Daneil Dolin, MD; gastric ulcer and erosions s/p biopsy, erosive gastropathy. Path of reactive and regenerative changes, no H. pylori.  . ESOPHAGOGASTRODUODENOSCOPY N/A 08/18/2016   Procedure: ESOPHAGOGASTRODUODENOSCOPY (EGD);  Surgeon: Daneil Dolin, MD; normal esophagus, previous gastric ulcer completely healed, small hiatal hernia, normal duodenum.  Marland Kitchen KNEE SURGERY     x2  . POLYPECTOMY  09/03/2020   Procedure: POLYPECTOMY;  Surgeon: Daneil Dolin, MD;  [facility];  Service: Endoscopy;;  . ROTATOR CUFF REPAIR     x2  . SHOULDER ARTHROSCOPY WITH ROTATOR CUFF REPAIR AND SUBACROMIAL DECOMPRESSION Right 01/21/2015   Procedure: RIGHT SHOULDER ARTHROSCOPIC DEBRIDEMNT OF G-H JOINT AND REMOVAL OF LOOSE BODIES,ARTHROSCOPIC SUBACROMIAL DECOMPRESSION,MINI OPEN RCT REPAIR WITH SUPPLEMENTAL [facility] PATCH;  Surgeon: Garald Balding, MD;  Location: [place];  Service: Orthopedics;  Laterality: Right;  . TONSILLECTOMY    . TRIGGER FINGER RELEASE    .  UVULOPALATOPHARYNGOPLASTY     Social History   Social History  . Marital Status: Married    Spouse Name: N/A  . Number of Children: 2   Occupational History  . BUS DRIVER - retired    Social History Main Topics  . Smoking status: Former Smoker -- 48 years    Types: Cigarettes  . Smokeless tobacco: Former Systems developer    Quit date: 12/20/2004  . Alcohol Use: No  . Drug Use: No   Current Outpatient Medications on File Prior to Visit  Medication Sig Dispense Refill  . acetaminophen (TYLENOL) 500 MG tablet Take 500-1,000 mg by mouth as needed (for pain/headache).     Marland Kitchen albuterol (VENTOLIN HFA) 108 (90 Base) MCG/ACT inhaler Inhale 2 puffs into the lungs every 6 (six) hours as needed for wheezing or shortness of breath. 18 g 2  . buPROPion (WELLBUTRIN XL) 300 MG 24 hr tablet Take 300 mg by mouth daily.    . Cholecalciferol (VITAMIN D3) 125 MCG (5000 UT) CAPS Take 1 capsule by mouth daily.     . Cyanocobalamin (VITAMIN B-12) 5000 MCG TBDP Take 1,000 mcg by mouth daily.     . cyclophosphamide (CYTOXAN) 25 MG capsule Take 25 mg by mouth daily.     Marland Kitchen  diclofenac sodium (VOLTAREN) 1 % GEL Apply 2-4 g topically 4 (four) times daily. 5 Tube 3  . fluticasone (FLONASE) 50 MCG/ACT nasal spray Place 2 sprays into both nostrils daily as needed for allergies or rhinitis.    . furosemide (LASIX) 80 MG tablet 3 (three) times a week.     Marland Kitchen guaiFENesin (MUCINEX) 600 MG 12 hr tablet Take 1 tablet (600 mg total) by mouth 2 (two) times daily. 20 tablet 0  . linaclotide (LINZESS) 290 MCG CAPS capsule Take 1 capsule (290 mcg total) by mouth daily before breakfast. 90 capsule 3  . losartan (COZAAR) 25 MG tablet Take 12.5 mg by mouth at bedtime.    . metoprolol succinate (TOPROL-XL) 50 MG 24 hr tablet Take 50 mg by mouth daily. Take with or immediately following a meal.    . montelukast (SINGULAIR) 10 MG tablet Take 1 tablet (10 mg total) by mouth every morning. 30 tablet 2  . nystatin (MYCOSTATIN) 100000 UNIT/ML  suspension Take 5 mLs by mouth 2 (two) times daily as needed (for thrush).     . ondansetron (ZOFRAN) 4 MG tablet Take 4 mg by mouth as needed for nausea or vomiting.     . pantoprazole (PROTONIX) 40 MG tablet Take 1 tablet (40 mg total) by mouth 2 (two) times daily before a meal. 60 tablet 3  . polyethylene glycol (MIRALAX / GLYCOLAX) packet Take 17 g by mouth daily as needed for mild constipation or moderate constipation.     . predniSONE (DELTASONE) 20 MG tablet Take 20 mg by mouth daily.     . raloxifene (EVISTA) 60 MG tablet Take 60 mg by mouth daily.    Marland Kitchen rOPINIRole (REQUIP) 0.25 MG tablet Take 0.75 mg by mouth daily as needed (for rls).     . rosuvastatin (CRESTOR) 10 MG tablet Take 10 mg by mouth at bedtime.    . tolterodine (DETROL) 1 MG tablet Take 1 mg by mouth 2 (two) times daily.     . Vitamin D, Ergocalciferol, (DRISDOL) 1.25 MG (50000 UNIT) CAPS capsule Take 50,000 Units by mouth every 7 (seven) days.     No current facility-administered medications on file prior to visit.   Allergies  Allergen Reactions  . Prednisone Other (See Comments)    Hallucinations   . Tramadol Nausea Only  . Aspirin Nausea And Vomiting  . Celecoxib Rash   Family History  Problem Relation Age of Onset  . Cancer Mother   . Cancer Father   . Diabetes Sister   . Colon cancer Neg Hx   . Liver disease Neg Hx    PE: BP 118/70   Pulse 85   Ht 4\' 11"  (1.499 m)   Wt 199 lb (90.3 kg)   SpO2 95%   BMI 40.19 kg/m  Wt Readings from Last 3 Encounters:  09/15/20 199 lb (90.3 kg)  09/03/20 197 lb (89.4 kg)  08/29/20 197 lb 9.6 oz (89.6 kg)   Constitutional: overweight, in NAD Eyes: PERRLA, EOMI, no exophthalmos ENT: moist mucous membranes, no thyromegaly, no cervical lymphadenopathy Cardiovascular: RRR, No MRG Respiratory: CTA B Gastrointestinal: abdomen soft, NT, ND, BS+ Musculoskeletal: no deformities, strength intact in all 4 Skin: moist, warm, no rashes Neurological: no tremor with  outstretched hands, DTR normal in all 4  ASSESSMENT: 1. Hashimoto thyroiditis  PLAN: 1. Hashimoto thyroiditis -Patient with a history of euthyroid Hashimoto's thyroiditis, for which she does not require levothyroxine treatment -Reviewed latest TSH, which was normal 1 year and  3 months ago: Lab Results  Component Value Date   TSH 1.25 06/14/2019  -I did suggest selenium in the past to decrease her TPO antibodies but she did not try this. -At last visit, her fatigue decrease significantly and she still had some hair loss, but improved.  Also, her hot flashes and her weight loss improved.  She initially had 30 pound weight loss after starting bupropion but then it leveled out.  She continues on this. She lost another 30 lbs since last OV - intentional -At today's visit I will check a TSH, free T4, free T3 -We again discussed about correct intake of levothyroxine in case we need to start:  every day, with water, at least 30 minutes before breakfast, separated by at least 4 hours from: - acid reflux medications - calcium - iron - multivitamins -I will see her back in a year  Component     Latest Ref Rng & Units 09/15/2020  T4,Free(Direct)     0.60 - 1.60 ng/dL 0.93  Triiodothyronine,Free,Serum     2.3 - 4.2 pg/mL 2.3  TSH     0.35 - 4.50 uIU/mL 2.12   Normal TFTs.  Philemon Kingdom, MD PhD [facility] Endocrinology

## 2020-09-17 DIAGNOSIS — N179 Acute kidney failure, unspecified: Secondary | ICD-10-CM | POA: Diagnosis not present

## 2020-10-08 MED FILL — CYCLOPHOSPHAMIDE 25 MG CAPS: 25 | 30 days supply | Qty: 90 | Fill #3

## 2020-10-10 DIAGNOSIS — N179 Acute kidney failure, unspecified: Secondary | ICD-10-CM | POA: Diagnosis not present

## 2020-10-11 DIAGNOSIS — Z23 Encounter for immunization: Secondary | ICD-10-CM | POA: Diagnosis not present

## 2020-10-13 ENCOUNTER — Ambulatory Visit (INDEPENDENT_AMBULATORY_CARE_PROVIDER_SITE_OTHER): Payer: Medicare Other | Admitting: Ophthalmology

## 2020-10-13 ENCOUNTER — Encounter (INDEPENDENT_AMBULATORY_CARE_PROVIDER_SITE_OTHER): Payer: Self-pay | Admitting: Ophthalmology

## 2020-10-13 ENCOUNTER — Other Ambulatory Visit: Payer: Self-pay

## 2020-10-13 DIAGNOSIS — H35712 Central serous chorioretinopathy, left eye: Secondary | ICD-10-CM | POA: Diagnosis not present

## 2020-10-13 DIAGNOSIS — H353221 Exudative age-related macular degeneration, left eye, with active choroidal neovascularization: Secondary | ICD-10-CM | POA: Insufficient documentation

## 2020-10-13 DIAGNOSIS — N058 Unspecified nephritic syndrome with other morphologic changes: Secondary | ICD-10-CM | POA: Diagnosis not present

## 2020-10-13 DIAGNOSIS — E8770 Fluid overload, unspecified: Secondary | ICD-10-CM | POA: Diagnosis not present

## 2020-10-13 DIAGNOSIS — E875 Hyperkalemia: Secondary | ICD-10-CM | POA: Diagnosis not present

## 2020-10-13 DIAGNOSIS — N2581 Secondary hyperparathyroidism of renal origin: Secondary | ICD-10-CM | POA: Diagnosis not present

## 2020-10-13 DIAGNOSIS — K219 Gastro-esophageal reflux disease without esophagitis: Secondary | ICD-10-CM | POA: Diagnosis not present

## 2020-10-13 DIAGNOSIS — E872 Acidosis: Secondary | ICD-10-CM | POA: Diagnosis not present

## 2020-10-13 DIAGNOSIS — I129 Hypertensive chronic kidney disease with stage 1 through stage 4 chronic kidney disease, or unspecified chronic kidney disease: Secondary | ICD-10-CM | POA: Diagnosis not present

## 2020-10-13 DIAGNOSIS — E785 Hyperlipidemia, unspecified: Secondary | ICD-10-CM | POA: Diagnosis not present

## 2020-10-13 DIAGNOSIS — Z23 Encounter for immunization: Secondary | ICD-10-CM | POA: Diagnosis not present

## 2020-10-13 DIAGNOSIS — N179 Acute kidney failure, unspecified: Secondary | ICD-10-CM | POA: Diagnosis not present

## 2020-10-13 DIAGNOSIS — D631 Anemia in chronic kidney disease: Secondary | ICD-10-CM | POA: Diagnosis not present

## 2020-10-13 DIAGNOSIS — N189 Chronic kidney disease, unspecified: Secondary | ICD-10-CM | POA: Diagnosis not present

## 2020-10-13 DIAGNOSIS — N057 Unspecified nephritic syndrome with diffuse crescentic glomerulonephritis: Secondary | ICD-10-CM | POA: Diagnosis not present

## 2020-10-13 MED ORDER — BEVACIZUMAB CHEMO INJECTION 1.25MG/0.05ML SYRINGE FOR KALEIDOSCOPE
1.2500 mg | INTRAVITREAL | Status: AC | PRN
  Administered 2020-10-13: 1.25 mg via INTRAVITREAL

## 2020-10-13 NOTE — Assessment & Plan Note (Signed)
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  Will commence with intravitreal Avastin OS today

## 2020-10-13 NOTE — Progress Notes (Signed)
10/13/2020     CHIEF COMPLAINT Patient presents for Retina Follow Up   HISTORY OF PRESENT ILLNESS: Cheryl [name] is a 74 y.o. female who presents to the clinic today for:   HPI    Retina Follow Up    Patient presents with  Other.  In left eye.  This started 6 weeks ago.  Severity is mild.  Duration of 6 weeks.  Since onset it is stable.          Comments    6 Week F/U OS  Pt c/o difficulty distinguishing between colors OS. Pt c/o itching OU.       Last edited by Rockie Neighbours, Byron on 10/13/2020  9:52 AM. (History)      Referring physician: Jake Samples, PA-C [place],  [place] [address]  HISTORICAL INFORMATION:   Selected notes from the [place]: No current outpatient medications on file. (Ophthalmic Drugs)   No current facility-administered medications for this visit. (Ophthalmic Drugs)   Current Outpatient Medications (Other)  Medication Sig   acetaminophen (TYLENOL) 500 MG tablet Take 500-1,000 mg by mouth as needed (for pain/headache).    albuterol (VENTOLIN HFA) 108 (90 Base) MCG/ACT inhaler Inhale 2 puffs into the lungs every 6 (six) hours as needed for wheezing or shortness of breath.   buPROPion (WELLBUTRIN XL) 300 MG 24 hr tablet Take 300 mg by mouth daily.   Cholecalciferol (VITAMIN D3) 125 MCG (5000 UT) CAPS Take 1 capsule by mouth daily.    Cyanocobalamin (VITAMIN B-12) 5000 MCG TBDP Take 1,000 mcg by mouth daily.    cyclophosphamide (CYTOXAN) 25 MG capsule Take 25 mg by mouth daily.    diclofenac sodium (VOLTAREN) 1 % GEL Apply 2-4 g topically 4 (four) times daily.   fluticasone (FLONASE) 50 MCG/ACT nasal spray Place 2 sprays into both nostrils daily as needed for allergies or rhinitis.   furosemide (LASIX) 80 MG tablet 3 (three) times a week.    guaiFENesin (MUCINEX) 600 MG 12 hr tablet Take 1 tablet (600 mg total) by mouth 2 (two) times daily.   linaclotide (LINZESS) 290  MCG CAPS capsule Take 1 capsule (290 mcg total) by mouth daily before breakfast.   losartan (COZAAR) 25 MG tablet Take 12.5 mg by mouth at bedtime.   metoprolol succinate (TOPROL-XL) 50 MG 24 hr tablet Take 50 mg by mouth daily. Take with or immediately following a meal.   montelukast (SINGULAIR) 10 MG tablet Take 1 tablet (10 mg total) by mouth every morning.   nystatin (MYCOSTATIN) 100000 UNIT/ML suspension Take 5 mLs by mouth 2 (two) times daily as needed (for thrush).    ondansetron (ZOFRAN) 4 MG tablet Take 4 mg by mouth as needed for nausea or vomiting.    pantoprazole (PROTONIX) 40 MG tablet Take 1 tablet (40 mg total) by mouth 2 (two) times daily before a meal.   polyethylene glycol (MIRALAX / GLYCOLAX) packet Take 17 g by mouth daily as needed for mild constipation or moderate constipation.    predniSONE (DELTASONE) 20 MG tablet Take 20 mg by mouth daily.    raloxifene (EVISTA) 60 MG tablet Take 60 mg by mouth daily.   rOPINIRole (REQUIP) 0.25 MG tablet Take 0.75 mg by mouth daily as needed (for rls).    rosuvastatin (CRESTOR) 10 MG tablet Take 10 mg by mouth at bedtime.   tolterodine (DETROL) 1 MG tablet Take 1 mg by mouth 2 (  two) times daily.    Vitamin D, Ergocalciferol, (DRISDOL) 1.25 MG (50000 UNIT) CAPS capsule Take 50,000 Units by mouth every 7 (seven) days.   No current facility-administered medications for this visit. (Other)      REVIEW OF SYSTEMS:    ALLERGIES Allergies  Allergen Reactions   Prednisone Other (See Comments)    Hallucinations    Tramadol Nausea Only   Aspirin Nausea And Vomiting   Celecoxib Rash    PAST MEDICAL HISTORY Past Medical History:  Diagnosis Date   Acid reflux    Arthritis    CKD (chronic kidney disease)    Constipation    GERD (gastroesophageal reflux disease)    Neuromuscular disorder ([facility])    PUD (peptic ulcer disease) 2017   gastric ulcer healed on repeat EGD in August 2017   Sinus drainage     Sleep apnea    had surgery to correct   Past Surgical History:  Procedure Laterality Date   ANTERIOR CERVICAL DECOMP/DISCECTOMY FUSION N/A 11/19/2016   Procedure: ANTERIOR CERVICAL DISCECTOMY AND FUSION C5-6 WITH PLATES, SCREWS, CAGE, VIVIGEN II;  Surgeon: Jessy Oto, MD;  Location: [place];  Service: Orthopedics;  Laterality: N/A;   CARPAL TUNNEL RELEASE     CATARACT EXTRACTION, BILATERAL Bilateral 2017   One in December 2017 and one in January 2018   COLONOSCOPY  02/15/2005   [id number]:[id number] small polyps ablated via cold biopsy, one from transverse colon and two from the rectum/Small external hemorrhoids   COLONOSCOPY  07/10/2010   [id number] rectum/long redundant colon, polyps in the sigmoid, descending, hepatic flexure/ADENOMATOUS POLYPS. next TCS due 06/2015   COLONOSCOPY  1995   3 polyps, path revealed chronic colitis   COLONOSCOPY N/A 08/05/2015   Procedure: COLONOSCOPY;  Surgeon: Daneil Dolin, MD; melanosis coli, colonic diverticulosis. Repeat colonoscopy in 5 years for surveillance.   COLONOSCOPY N/A 09/03/2020   Procedure: COLONOSCOPY;  Surgeon: Daneil Dolin, MD;  [facility];  Service: Endoscopy;  Laterality: N/A;  11:15am   ESOPHAGOGASTRODUODENOSCOPY  1995   Gastritis   ESOPHAGOGASTRODUODENOSCOPY N/A 04/16/2013   RMR: HH   ESOPHAGOGASTRODUODENOSCOPY N/A 05/13/2016   Procedure: ESOPHAGOGASTRODUODENOSCOPY (EGD);  Surgeon: Daneil Dolin, MD; gastric ulcer and erosions s/p biopsy, erosive gastropathy. Path of reactive and regenerative changes, no H. pylori.   ESOPHAGOGASTRODUODENOSCOPY N/A 08/18/2016   Procedure: ESOPHAGOGASTRODUODENOSCOPY (EGD);  Surgeon: Daneil Dolin, MD; normal esophagus, previous gastric ulcer completely healed, small hiatal hernia, normal duodenum.   KNEE SURGERY     x2   POLYPECTOMY  09/03/2020   Procedure: POLYPECTOMY;  Surgeon: Daneil Dolin, MD;  [facility];  Service: Endoscopy;;   ROTATOR CUFF REPAIR     x2    SHOULDER ARTHROSCOPY WITH ROTATOR CUFF REPAIR AND SUBACROMIAL DECOMPRESSION Right 01/21/2015   Procedure: RIGHT SHOULDER ARTHROSCOPIC DEBRIDEMNT OF G-H JOINT AND REMOVAL OF LOOSE [place] SUBACROMIAL [place] OPEN RCT REPAIR WITH SUPPLEMENTAL [facility] PATCH;  Surgeon: Garald Balding, MD;  Location: [place];  Service: Orthopedics;  Laterality: Right;   TONSILLECTOMY     TRIGGER FINGER RELEASE     UVULOPALATOPHARYNGOPLASTY      FAMILY HISTORY Family History  Problem Relation Age of Onset   Cancer Mother    Cancer Father    Diabetes Sister    Colon cancer Neg Hx    Liver disease Neg Hx     SOCIAL HISTORY Social History   Tobacco Use   Smoking status: Former Smoker    Packs/day: 0.10  Years: 48.00    Pack years: 4.80    Types: Cigarettes    Quit date: 05/11/2005    Years since quitting: 15.4   Smokeless tobacco: Never Used  Substance Use Topics   Alcohol use: No    Alcohol/week: 0.0 standard drinks   Drug use: No         OPHTHALMIC EXAM: Base Eye Exam    Visual Acuity (ETDRS)      Right Left   Dist cc 20/25 +2 20/70   Dist ph cc  20/60 +2   Correction: Glasses       Tonometry (Tonopen, 9:52 AM)      Right Left   Pressure 11 12       Pupils      Pupils Dark Light Shape React APD   Right PERRL 4 3 Round Brisk None   Left PERRL 4 3 Round Brisk None       Visual Fields (Counting fingers)      Left Right    Full Full       Extraocular Movement      Right Left    Full Full       Neuro/Psych    Oriented x3: Yes   Mood/Affect: Normal       Dilation    Left eye: 1.0% Mydriacyl, 2.5% Phenylephrine @ 9:55 AM        Slit Lamp and Fundus Exam    External Exam      Right Left   External Normal Normal       Slit Lamp Exam      Right Left   Lids/Lashes Normal Normal   Conjunctiva/Sclera White and quiet White and quiet   Cornea Clear Clear   Anterior Chamber Deep and quiet Deep and quiet   Iris Round and reactive  Round and reactive   Lens Posterior chamber intraocular lens Posterior chamber intraocular lens   Anterior Vitreous Normal Normal       Fundus Exam      Right Left   Posterior Vitreous  Normal   Disc  Normal   C/D Ratio  0.5   Macula  Macular thickening, Retinal pigment epithelial detachment,, subretinal fluid inferior to the fovea.  Subretinal white fibrotic area corresponds with the RPE detachment noted on OCT temporal to the fovea.   Vessels  Normal   Periphery  Normal          IMAGING AND PROCEDURES  Imaging and Procedures for 10/13/20  OCT, Retina - OU - Both Eyes       Right Eye Quality was good. Scan locations included subfoveal. Central Foveal Thickness: 263. Findings include no SRF, normal foveal contour, vitreomacular adhesion .   Left Eye Quality was good. Scan locations included subfoveal. Central Foveal Thickness: 444. Progression has worsened. Findings include abnormal foveal contour, subretinal fluid, pigment epithelial detachment, vitreomacular adhesion .   Notes OS with worse subretinal fluid and marked pigment epithelial detachment temporally likely a form of wet macular degeneration as it has worsened.  We will commence and institute intravitreal Avastin OS today and return follow-up visit in 6 weeks       Intravitreal Injection, Pharmacologic Agent - OS - Left Eye       Time Out 10/13/2020. 11:06 AM. Confirmed correct patient, procedure, site, and patient consented.   Anesthesia Topical anesthesia was used. Anesthetic medications included Akten 3.5%.   Procedure Preparation included Ofloxacin , 10% betadine to eyelids, 5% betadine to ocular surface.  A 30 gauge needle was used.   Injection:  1.25 mg Bevacizumab (AVASTIN) SOLN   NDC: 70360-001-02, Lot: [id number]   Route: Intravitreal, Site: Left Eye, Waste: 0 mg  Post-op Post injection exam found visual acuity of at least counting fingers. The patient tolerated the procedure well. There were no  complications. The patient received written and verbal post procedure care education. Post injection medications were not given.                 ASSESSMENT/PLAN:  No problem-specific Assessment & Plan notes found for this encounter.      ICD-10-CM   1. 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 Acute central serous retinopathy with subretinal fluid, left  H35.712 OCT, Retina - OU - Both Eyes    1.  Menz with intravitreal Avastin OS today and examination in 6 weeks  2.  3.  Ophthalmic Meds Ordered this visit:  Meds ordered this encounter  Medications   Bevacizumab (AVASTIN) SOLN 1.25 mg       Return in about 6 weeks (around 11/24/2020) for dilate, OS.  Patient Instructions  Patient instructed to contact the office promptly for new onset visual acuity decline or distortions    Explained the diagnoses, plan, and follow up with the patient and they expressed understanding.  Patient expressed understanding of the importance of proper follow up care.   Clent Demark Jarett Dralle M.D. Diseases & Surgery of the Retina and Vitreous Retina [facility] Diabetic [place] 10/13/20     Abbreviations: M myopia (nearsighted); A astigmatism; H hyperopia (farsighted); P presbyopia; Mrx spectacle prescription;  CTL contact lenses; OD right eye; OS left eye; OU both eyes  XT exotropia; ET esotropia; PEK punctate epithelial keratitis; PEE punctate epithelial erosions; DES dry eye syndrome; MGD meibomian gland dysfunction; ATs artificial tears; PFAT's preservative free artificial tears; [place] nuclear sclerotic cataract; PSC posterior subcapsular cataract; ERM epi-retinal membrane; PVD posterior vitreous detachment; RD retinal detachment; DM diabetes mellitus; DR diabetic retinopathy; NPDR non-proliferative diabetic retinopathy; PDR  proliferative diabetic retinopathy; CSME clinically significant macular edema; DME diabetic macular edema; dbh dot blot hemorrhages; CWS cotton wool spot; POAG primary open angle glaucoma; C/D cup-to-disc ratio; HVF humphrey visual field; GVF goldmann visual field; OCT optical coherence tomography; IOP intraocular pressure; BRVO Branch retinal vein occlusion; CRVO central retinal vein occlusion; CRAO central retinal artery occlusion; BRAO branch retinal artery occlusion; RT retinal tear; SB scleral buckle; PPV pars plana vitrectomy; VH Vitreous hemorrhage; PRP panretinal laser photocoagulation; IVK intravitreal kenalog; VMT vitreomacular traction; MH Macular hole;  NVD neovascularization of the disc; NVE neovascularization elsewhere; AREDS age related eye disease study; ARMD age related macular degeneration; POAG primary open angle glaucoma; EBMD epithelial/anterior basement membrane dystrophy; ACIOL anterior chamber intraocular lens; IOL intraocular lens; PCIOL posterior chamber intraocular lens; Phaco/IOL phacoemulsification with intraocular lens placement; [place] photorefractive keratectomy; LASIK laser assisted in situ keratomileusis; HTN hypertension; DM diabetes mellitus; COPD chronic obstructive pulmonary disease

## 2020-10-13 NOTE — Patient Instructions (Signed)
Patient instructed to contact the office promptly for new onset visual acuity decline or distortions 

## 2020-10-18 ENCOUNTER — Other Ambulatory Visit: Payer: Self-pay | Admitting: Gastroenterology

## 2020-10-18 DIAGNOSIS — R110 Nausea: Secondary | ICD-10-CM

## 2020-10-18 DIAGNOSIS — K219 Gastro-esophageal reflux disease without esophagitis: Secondary | ICD-10-CM

## 2020-10-26 ENCOUNTER — Other Ambulatory Visit: Payer: Self-pay | Admitting: Gastroenterology

## 2020-10-26 DIAGNOSIS — K5900 Constipation, unspecified: Secondary | ICD-10-CM

## 2020-10-27 DIAGNOSIS — D631 Anemia in chronic kidney disease: Secondary | ICD-10-CM | POA: Diagnosis not present

## 2020-10-27 DIAGNOSIS — N179 Acute kidney failure, unspecified: Secondary | ICD-10-CM | POA: Diagnosis not present

## 2020-10-27 DIAGNOSIS — M810 Age-related osteoporosis without current pathological fracture: Secondary | ICD-10-CM | POA: Diagnosis not present

## 2020-11-03 DIAGNOSIS — N179 Acute kidney failure, unspecified: Secondary | ICD-10-CM | POA: Diagnosis not present

## 2020-11-03 DIAGNOSIS — N057 Unspecified nephritic syndrome with diffuse crescentic glomerulonephritis: Secondary | ICD-10-CM | POA: Diagnosis not present

## 2020-11-03 DIAGNOSIS — E785 Hyperlipidemia, unspecified: Secondary | ICD-10-CM | POA: Diagnosis not present

## 2020-11-03 DIAGNOSIS — D84821 Immunodeficiency due to drugs: Secondary | ICD-10-CM | POA: Diagnosis not present

## 2020-11-03 DIAGNOSIS — N184 Chronic kidney disease, stage 4 (severe): Secondary | ICD-10-CM | POA: Diagnosis not present

## 2020-11-03 DIAGNOSIS — D631 Anemia in chronic kidney disease: Secondary | ICD-10-CM | POA: Diagnosis not present

## 2020-11-03 DIAGNOSIS — E875 Hyperkalemia: Secondary | ICD-10-CM | POA: Diagnosis not present

## 2020-11-03 DIAGNOSIS — E872 Acidosis: Secondary | ICD-10-CM | POA: Diagnosis not present

## 2020-11-03 DIAGNOSIS — N058 Unspecified nephritic syndrome with other morphologic changes: Secondary | ICD-10-CM | POA: Diagnosis not present

## 2020-11-03 DIAGNOSIS — N2581 Secondary hyperparathyroidism of renal origin: Secondary | ICD-10-CM | POA: Diagnosis not present

## 2020-11-03 DIAGNOSIS — Z79899 Other long term (current) drug therapy: Secondary | ICD-10-CM | POA: Diagnosis not present

## 2020-11-03 DIAGNOSIS — I129 Hypertensive chronic kidney disease with stage 1 through stage 4 chronic kidney disease, or unspecified chronic kidney disease: Secondary | ICD-10-CM | POA: Diagnosis not present

## 2020-11-10 DIAGNOSIS — N184 Chronic kidney disease, stage 4 (severe): Secondary | ICD-10-CM | POA: Diagnosis not present

## 2020-11-17 DIAGNOSIS — N179 Acute kidney failure, unspecified: Secondary | ICD-10-CM | POA: Diagnosis not present

## 2020-11-18 ENCOUNTER — Other Ambulatory Visit: Payer: Self-pay

## 2020-11-18 ENCOUNTER — Ambulatory Visit (INDEPENDENT_AMBULATORY_CARE_PROVIDER_SITE_OTHER): Payer: Medicare Other | Admitting: Internal Medicine

## 2020-11-18 ENCOUNTER — Encounter: Payer: Self-pay | Admitting: Internal Medicine

## 2020-11-18 VITALS — BP 158/79 | HR 83 | Temp 96.8°F | Ht 59.0 in | Wt 203.0 lb

## 2020-11-18 DIAGNOSIS — K5900 Constipation, unspecified: Secondary | ICD-10-CM | POA: Diagnosis not present

## 2020-11-18 DIAGNOSIS — Z86010 Personal history of colonic polyps: Secondary | ICD-10-CM | POA: Diagnosis not present

## 2020-11-18 DIAGNOSIS — N184 Chronic kidney disease, stage 4 (severe): Secondary | ICD-10-CM | POA: Diagnosis not present

## 2020-11-18 DIAGNOSIS — K219 Gastro-esophageal reflux disease without esophagitis: Secondary | ICD-10-CM

## 2020-11-18 NOTE — Patient Instructions (Addendum)
Continue protonix 40 mg daily  Continue Linzess 290 daily   Consider one more colonoscopy in 3 years  OV here in 1 year and as needed

## 2020-11-18 NOTE — Progress Notes (Signed)
Primary Care Physician:  Scherrie Bateman Primary Gastroenterologist:  Dr. Gala Romney  Pre-Procedure History & Physical: HPI:  Cheryl [name] is a 74 y.o. female here for follow-up of GERD and constipation.  Constipation well managed on Linzess 290.  Reflux symptoms well controlled on Protonix 40 mg daily.  Recent colonoscopy yielded multiple small adenomas; due for surveillance 3 years if overall health permits.  Major health issues now are that of chronic kidney disease and eye issues for which she sees Dr. Zadie Rhine.  She feels very good from a GI standpoint.  Occasionally gets a "sour stomach" drinks a glass of buttermilk.  Feels she is doing very well from a GI standpoint.  Past Medical History:  Diagnosis Date  . Acid reflux   . Arthritis   . CKD (chronic kidney disease)   . Constipation   . GERD (gastroesophageal reflux disease)   . Neuromuscular disorder ([place])   . PUD (peptic ulcer disease) 2017   gastric ulcer healed on repeat EGD in August 2017  . Sinus drainage   . Sleep apnea    had surgery to correct    Past Surgical History:  Procedure Laterality Date  . ANTERIOR CERVICAL DECOMP/DISCECTOMY FUSION N/A 11/19/2016   Procedure: ANTERIOR CERVICAL DISCECTOMY AND FUSION C5-6 WITH PLATES, SCREWS, CAGE, VIVIGEN II;  Surgeon: Jessy Oto, MD;  Location: [place];  Service: Orthopedics;  Laterality: N/A;  . CARPAL TUNNEL RELEASE    . CATARACT EXTRACTION, BILATERAL Bilateral 2017   One in December 2017 and one in January 2018  . COLONOSCOPY  02/15/2005   BJS:EGBTD small polyps ablated via cold biopsy, one from transverse colon and two from the rectum/Small external hemorrhoids  . COLONOSCOPY  07/10/2010   VVO:HYWVPX rectum/long redundant colon, polyps in the sigmoid, descending, hepatic flexure/ADENOMATOUS POLYPS. next TCS due 06/2015  . COLONOSCOPY  1995   3 polyps, path revealed chronic colitis  . COLONOSCOPY N/A 08/05/2015   Procedure: COLONOSCOPY;  Surgeon: Daneil Dolin,  MD; melanosis coli, colonic diverticulosis. Repeat colonoscopy in 5 years for surveillance.  . COLONOSCOPY N/A 09/03/2020   Procedure: COLONOSCOPY;  Surgeon: Daneil Dolin, MD;  [facility];  Service: Endoscopy;  Laterality: N/A;  11:15am  . ESOPHAGOGASTRODUODENOSCOPY  1995   Gastritis  . ESOPHAGOGASTRODUODENOSCOPY N/A 04/16/2013   RMR: HH  . ESOPHAGOGASTRODUODENOSCOPY N/A 05/13/2016   Procedure: ESOPHAGOGASTRODUODENOSCOPY (EGD);  Surgeon: Daneil Dolin, MD; gastric ulcer and erosions s/p biopsy, erosive gastropathy. Path of reactive and regenerative changes, no H. pylori.  . ESOPHAGOGASTRODUODENOSCOPY N/A 08/18/2016   Procedure: ESOPHAGOGASTRODUODENOSCOPY (EGD);  Surgeon: Daneil Dolin, MD; normal esophagus, previous gastric ulcer completely healed, small hiatal hernia, normal duodenum.  Marland Kitchen KNEE SURGERY     x2  . POLYPECTOMY  09/03/2020   Procedure: POLYPECTOMY;  Surgeon: Daneil Dolin, MD;  [facility];  Service: Endoscopy;;  . ROTATOR CUFF REPAIR     x2  . SHOULDER ARTHROSCOPY WITH ROTATOR CUFF REPAIR AND SUBACROMIAL DECOMPRESSION Right 01/21/2015   Procedure: RIGHT SHOULDER ARTHROSCOPIC DEBRIDEMNT OF G-H JOINT AND REMOVAL OF LOOSE BODIES,ARTHROSCOPIC SUBACROMIAL DECOMPRESSION,MINI OPEN RCT REPAIR WITH SUPPLEMENTAL [facility] PATCH;  Surgeon: Garald Balding, MD;  Location: [place];  Service: Orthopedics;  Laterality: Right;  . TONSILLECTOMY    . TRIGGER FINGER RELEASE    . UVULOPALATOPHARYNGOPLASTY      Prior to Admission medications   Medication Sig Start Date End Date Taking? Authorizing Provider  acetaminophen (TYLENOL) 500 MG tablet Take  500-1,000 mg by mouth as needed (for pain/headache).    Yes [provider]  albuterol (VENTOLIN HFA) 108 (90 Base) MCG/ACT inhaler Inhale 2 puffs into the lungs every 6 (six) hours as needed for wheezing or shortness of breath. 03/29/20  Yes Emokpae, Courage, MD  buPROPion (WELLBUTRIN XL) 300 MG 24 hr tablet Take 300  mg by mouth daily. 05/15/19  Yes [provider]  Cholecalciferol (VITAMIN D3) 125 MCG (5000 UT) CAPS Take 1 capsule by mouth daily.    Yes [provider]  Cyanocobalamin (VITAMIN B-12) 5000 MCG TBDP Take 1,000 mcg by mouth daily.    Yes [provider]  cyclophosphamide (CYTOXAN) 25 MG capsule Take 25 mg by mouth daily.  05/27/20  Yes [provider]  diclofenac sodium (VOLTAREN) 1 % GEL Apply 2-4 g topically 4 (four) times daily. Patient taking differently: Apply 2-4 g topically as needed.  08/02/17  Yes Petrarca, Mike Craze, PA-C  fluticasone (FLONASE) 50 MCG/ACT nasal spray Place 2 sprays into both nostrils daily as needed for allergies or rhinitis.   Yes [provider]  furosemide (LASIX) 80 MG tablet as needed.  05/26/20  Yes [provider]  guaiFENesin (MUCINEX) 600 MG 12 hr tablet Take 1 tablet (600 mg total) by mouth 2 (two) times daily. Patient taking differently: Take 600 mg by mouth 2 (two) times daily as needed.  03/29/20  Yes Emokpae, Courage, MD  LINZESS 290 MCG CAPS capsule TAKE 1 CAPSULE BY MOUTH DAILY BEFORE BREAKFAST. 10/29/20  Yes Aliene Altes S, PA-C  losartan (COZAAR) 25 MG tablet Take 12.5 mg by mouth at bedtime.   Yes [provider]  metoprolol succinate (TOPROL-XL) 50 MG 24 hr tablet Take 50 mg by mouth daily. Take with or immediately following a meal.   Yes [provider]  montelukast (SINGULAIR) 10 MG tablet Take 1 tablet (10 mg total) by mouth every morning. 03/29/20  Yes Emokpae, Courage, MD  nystatin (MYCOSTATIN) 100000 UNIT/ML suspension Take 5 mLs by mouth 2 (two) times daily as needed (for thrush).  02/22/20  Yes [provider]  ondansetron (ZOFRAN) 4 MG tablet Take 4 mg by mouth as needed for nausea or vomiting.  04/25/20  Yes [provider]  pantoprazole (PROTONIX) 40 MG tablet TAKE 1 TABLET (40 MG TOTAL) BY MOUTH 2 (TWO) TIMES DAILY BEFORE A MEAL. 10/22/20  Yes Carlis Stable, NP    polyethylene glycol (MIRALAX / GLYCOLAX) packet Take 17 g by mouth daily as needed for mild constipation or moderate constipation.    Yes [provider]  predniSONE (DELTASONE) 20 MG tablet Take 15 mg by mouth daily.  06/13/20  Yes [provider]  raloxifene (EVISTA) 60 MG tablet Take 60 mg by mouth daily.   Yes [provider]  rOPINIRole (REQUIP) 0.25 MG tablet Take 0.75 mg by mouth daily as needed (for rls).  12/12/19  Yes [provider]  rosuvastatin (CRESTOR) 10 MG tablet Take 10 mg by mouth at bedtime.   Yes [provider]  tolterodine (DETROL) 1 MG tablet Take 1 mg by mouth as needed.    Yes [provider]  Vitamin D, Ergocalciferol, (DRISDOL) 1.25 MG (50000 UNIT) CAPS capsule Take 50,000 Units by mouth every 7 (seven) days.   Yes [provider]    Allergies as of 11/18/2020 - Review Complete 11/18/2020  Allergen Reaction Noted  . Prednisone Other (See Comments) 04/04/2013  . Tramadol Nausea Only 01/21/2015  . Aspirin Nausea  And Vomiting   . Celecoxib Rash     Family History  Problem Relation Age of Onset  . Cancer Mother   . Cancer Father   . Diabetes Sister   . Colon cancer Neg Hx   . Liver disease Neg Hx     Social History   Socioeconomic History  . Marital status: Married    Spouse name: Not on file  . Number of children: 2  . Years of education: Not on file  . Highest education level: Not on file  Occupational History  . Occupation: BUS DRIVER    Employer: [employer]  Tobacco Use  . Smoking status: Former Smoker    Packs/day: 0.10    Years: 48.00    Pack years: 4.80    Types: Cigarettes    Quit date: 05/11/2005    Years since quitting: 15.5  . Smokeless tobacco: Never Used  Substance and Sexual Activity  . Alcohol use: No    Alcohol/week: 0.0 standard drinks  . Drug use: No  . Sexual activity: Not on file  Other Topics Concern  . Not on file  Social History Narrative  . Not  on file   Social Determinants of Health   Financial Resource Strain:   . Difficulty of Paying Living Expenses: Not on file  Food Insecurity:   . Worried About Charity fundraiser in the Last Year: Not on file  . Ran Out of Food in the Last Year: Not on file  Transportation Needs:   . Lack of Transportation (Medical): Not on file  . Lack of Transportation (Non-Medical): Not on file  Physical Activity:   . Days of Exercise per Week: Not on file  . Minutes of Exercise per Session: Not on file  Stress:   . Feeling of Stress : Not on file  Social Connections:   . Frequency of Communication with Friends and Family: Not on file  . Frequency of Social Gatherings with Friends and Family: Not on file  . Attends Religious Services: Not on file  . Active Member of Clubs or Organizations: Not on file  . Attends Archivist Meetings: Not on file  . Marital Status: Not on file  Intimate Partner Violence:   . Fear of Current or Ex-Partner: Not on file  . Emotionally Abused: Not on file  . Physically Abused: Not on file  . Sexually Abused: Not on file    Review of Systems: See HPI, otherwise negative ROS  Physical Exam: BP (!) 158/79   Pulse 83   Temp (!) 96.8 F (36 C) (Temporal)   Ht 4\' 11"  (1.499 m)   Wt 203 lb (92.1 kg)   BMI 41.00 kg/m  General:   Alert,  Well-developed, well-nourished, pleasant and cooperative in NAD Neck:  Supple; no masses or thyromegaly. No significant cervical adenopathy.  Impression/Plan: 74 year old lady with multiple medical problems including GERD and chronic constipation doing very well on her current regimen at this time. Multiple colonic adenomas (small) removed 2 months ago.  Will consider 1 more colonoscopy in 3 years if overall health permits.  Recommendations: Continue protonix 40 mg daily  Continue Linzess 290 daily   Consider one more colonoscopy in 3 years  OV here in 1 year and as needed     Notice: This dictation was  prepared with Dragon dictation along with smaller phrase technology. Any transcriptional errors that result from this process are unintentional and may not be corrected upon review.

## 2020-11-24 ENCOUNTER — Encounter (INDEPENDENT_AMBULATORY_CARE_PROVIDER_SITE_OTHER): Payer: Self-pay | Admitting: Ophthalmology

## 2020-11-24 ENCOUNTER — Encounter (INDEPENDENT_AMBULATORY_CARE_PROVIDER_SITE_OTHER): Payer: Medicare Other | Admitting: Ophthalmology

## 2020-11-24 ENCOUNTER — Ambulatory Visit (INDEPENDENT_AMBULATORY_CARE_PROVIDER_SITE_OTHER): Payer: Medicare Other | Admitting: Ophthalmology

## 2020-11-24 ENCOUNTER — Other Ambulatory Visit: Payer: Self-pay

## 2020-11-24 DIAGNOSIS — E785 Hyperlipidemia, unspecified: Secondary | ICD-10-CM | POA: Diagnosis not present

## 2020-11-24 DIAGNOSIS — H353221 Exudative age-related macular degeneration, left eye, with active choroidal neovascularization: Secondary | ICD-10-CM | POA: Diagnosis not present

## 2020-11-24 DIAGNOSIS — D631 Anemia in chronic kidney disease: Secondary | ICD-10-CM | POA: Diagnosis not present

## 2020-11-24 DIAGNOSIS — Z79899 Other long term (current) drug therapy: Secondary | ICD-10-CM | POA: Diagnosis not present

## 2020-11-24 DIAGNOSIS — N057 Unspecified nephritic syndrome with diffuse crescentic glomerulonephritis: Secondary | ICD-10-CM | POA: Diagnosis not present

## 2020-11-24 DIAGNOSIS — N058 Unspecified nephritic syndrome with other morphologic changes: Secondary | ICD-10-CM | POA: Diagnosis not present

## 2020-11-24 DIAGNOSIS — N184 Chronic kidney disease, stage 4 (severe): Secondary | ICD-10-CM | POA: Diagnosis not present

## 2020-11-24 DIAGNOSIS — E872 Acidosis: Secondary | ICD-10-CM | POA: Diagnosis not present

## 2020-11-24 DIAGNOSIS — N179 Acute kidney failure, unspecified: Secondary | ICD-10-CM | POA: Diagnosis not present

## 2020-11-24 DIAGNOSIS — D84821 Immunodeficiency due to drugs: Secondary | ICD-10-CM | POA: Diagnosis not present

## 2020-11-24 DIAGNOSIS — N2581 Secondary hyperparathyroidism of renal origin: Secondary | ICD-10-CM | POA: Diagnosis not present

## 2020-11-24 DIAGNOSIS — I129 Hypertensive chronic kidney disease with stage 1 through stage 4 chronic kidney disease, or unspecified chronic kidney disease: Secondary | ICD-10-CM | POA: Diagnosis not present

## 2020-11-24 DIAGNOSIS — K219 Gastro-esophageal reflux disease without esophagitis: Secondary | ICD-10-CM | POA: Diagnosis not present

## 2020-11-24 DIAGNOSIS — H35712 Central serous chorioretinopathy, left eye: Secondary | ICD-10-CM | POA: Diagnosis not present

## 2020-11-24 MED ORDER — BEVACIZUMAB 2.5 MG/0.1ML IZ SOSY
2.5000 mg | PREFILLED_SYRINGE | INTRAVITREAL | Status: AC | PRN
  Administered 2020-11-24: 2.5 mg via INTRAVITREAL

## 2020-11-24 NOTE — Assessment & Plan Note (Signed)
Component of wet ARMD, improving on intravitreal Avastin, 6 weeks previous, also with tapering of systemic prednisone use

## 2020-11-24 NOTE — Assessment & Plan Note (Signed)
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

## 2020-11-24 NOTE — Progress Notes (Signed)
11/24/2020     CHIEF COMPLAINT Patient presents for Retina Follow Up   HISTORY OF PRESENT ILLNESS: Cheryl [name] is a 74 y.o. female who presents to the clinic today for:   HPI    Retina Follow Up    Patient presents with  Other.  In left eye.  This started 6 weeks ago.  Severity is mild.  Duration of 6 weeks.  Since onset it is stable.          Comments    6 Week F/U OS  Pt denies noticeable changes to [place] OU since last visit. Pt denies ocular pain, flashes of light, or floaters OU.  No new symptoms reported OU.       Last edited by Rockie Neighbours, [place] on 11/24/2020  2:35 PM. (History)      Referring physician: Jake Samples, PA-C [place],  [place] [id number]  HISTORICAL INFORMATION:   Selected notes from the [facility]: No current outpatient medications on file. (Ophthalmic Drugs)   No current facility-administered medications for this visit. (Ophthalmic Drugs)   Current Outpatient Medications (Other)  Medication Sig  . acetaminophen (TYLENOL) 500 MG tablet Take 500-1,000 mg by mouth as needed (for pain/headache).   Marland Kitchen albuterol (VENTOLIN HFA) 108 (90 Base) MCG/ACT inhaler Inhale 2 puffs into the lungs every 6 (six) hours as needed for wheezing or shortness of breath.  Marland Kitchen buPROPion (WELLBUTRIN XL) 300 MG 24 hr tablet Take 300 mg by mouth daily.  . Cholecalciferol (VITAMIN D3) 125 MCG (5000 UT) CAPS Take 1 capsule by mouth daily.   . Cyanocobalamin (VITAMIN B-12) 5000 MCG TBDP Take 1,000 mcg by mouth daily.   . cyclophosphamide (CYTOXAN) 25 MG capsule Take 25 mg by mouth daily.   . diclofenac sodium (VOLTAREN) 1 % GEL Apply 2-4 g topically 4 (four) times daily. (Patient taking differently: Apply 2-4 g topically as needed. )  . fluticasone (FLONASE) 50 MCG/ACT nasal spray Place 2 sprays into both nostrils daily as needed for allergies or rhinitis.  . furosemide (LASIX) 80 MG tablet as needed.   Marland Kitchen guaiFENesin  (MUCINEX) 600 MG 12 hr tablet Take 1 tablet (600 mg total) by mouth 2 (two) times daily. (Patient taking differently: Take 600 mg by mouth 2 (two) times daily as needed. )  . LINZESS 290 MCG CAPS capsule TAKE 1 CAPSULE BY MOUTH DAILY BEFORE BREAKFAST.  Marland Kitchen losartan (COZAAR) 25 MG tablet Take 12.5 mg by mouth at bedtime.  . metoprolol succinate (TOPROL-XL) 50 MG 24 hr tablet Take 50 mg by mouth daily. Take with or immediately following a meal.  . montelukast (SINGULAIR) 10 MG tablet Take 1 tablet (10 mg total) by mouth every morning.  . nystatin (MYCOSTATIN) 100000 UNIT/ML suspension Take 5 mLs by mouth 2 (two) times daily as needed (for thrush).   . ondansetron (ZOFRAN) 4 MG tablet Take 4 mg by mouth as needed for nausea or vomiting.   . pantoprazole (PROTONIX) 40 MG tablet TAKE 1 TABLET (40 MG TOTAL) BY MOUTH 2 (TWO) TIMES DAILY BEFORE A MEAL.  Marland Kitchen polyethylene glycol (MIRALAX / GLYCOLAX) packet Take 17 g by mouth daily as needed for mild constipation or moderate constipation.   . predniSONE (DELTASONE) 20 MG tablet Take 15 mg by mouth daily.   . raloxifene (EVISTA) 60 MG tablet Take 60 mg by mouth daily.  Marland Kitchen rOPINIRole (REQUIP) 0.25 MG tablet Take 0.75 mg by mouth  daily as needed (for rls).   . rosuvastatin (CRESTOR) 10 MG tablet Take 10 mg by mouth at bedtime.  . tolterodine (DETROL) 1 MG tablet Take 1 mg by mouth as needed.   . Vitamin D, Ergocalciferol, (DRISDOL) 1.25 MG (50000 UNIT) CAPS capsule Take 50,000 Units by mouth every 7 (seven) days.   No current facility-administered medications for this visit. (Other)      REVIEW OF SYSTEMS:    ALLERGIES Allergies  Allergen Reactions  . Prednisone Other (See Comments)    Hallucinations   . Tramadol Nausea Only  . Aspirin Nausea And Vomiting  . Celecoxib Rash    PAST MEDICAL HISTORY Past Medical History:  Diagnosis Date  . Acid reflux   . Arthritis   . CKD (chronic kidney disease)   . Constipation   . GERD (gastroesophageal  reflux disease)   . Neuromuscular disorder ([place])   . PUD (peptic ulcer disease) 2017   gastric ulcer healed on repeat EGD in August 2017  . Sinus drainage   . Sleep apnea    had surgery to correct   Past Surgical History:  Procedure Laterality Date  . ANTERIOR CERVICAL DECOMP/DISCECTOMY FUSION N/A 11/19/2016   Procedure: ANTERIOR CERVICAL DISCECTOMY AND FUSION C5-6 WITH PLATES, SCREWS, CAGE, VIVIGEN II;  Surgeon: Jessy Oto, MD;  Location: [place];  Service: Orthopedics;  Laterality: N/A;  . CARPAL TUNNEL RELEASE    . CATARACT EXTRACTION, BILATERAL Bilateral 2017   One in December 2017 and one in January 2018  . COLONOSCOPY  02/15/2005   CBU:LAGTX small polyps ablated via cold biopsy, one from transverse colon and two from the rectum/Small external hemorrhoids  . COLONOSCOPY  07/10/2010   MIW:OEHOZY rectum/long redundant colon, polyps in the sigmoid, descending, hepatic flexure/ADENOMATOUS POLYPS. next TCS due 06/2015  . COLONOSCOPY  1995   3 polyps, path revealed chronic colitis  . COLONOSCOPY N/A 08/05/2015   Procedure: COLONOSCOPY;  Surgeon: Daneil Dolin, MD; melanosis coli, colonic diverticulosis. Repeat colonoscopy in 5 years for surveillance.  . COLONOSCOPY N/A 09/03/2020   Procedure: COLONOSCOPY;  Surgeon: Daneil Dolin, MD;  [facility];  Service: Endoscopy;  Laterality: N/A;  11:15am  . ESOPHAGOGASTRODUODENOSCOPY  1995   Gastritis  . ESOPHAGOGASTRODUODENOSCOPY N/A 04/16/2013   RMR: HH  . ESOPHAGOGASTRODUODENOSCOPY N/A 05/13/2016   Procedure: ESOPHAGOGASTRODUODENOSCOPY (EGD);  Surgeon: Daneil Dolin, MD; gastric ulcer and erosions s/p biopsy, erosive gastropathy. Path of reactive and regenerative changes, no H. pylori.  . ESOPHAGOGASTRODUODENOSCOPY N/A 08/18/2016   Procedure: ESOPHAGOGASTRODUODENOSCOPY (EGD);  Surgeon: Daneil Dolin, MD; normal esophagus, previous gastric ulcer completely healed, small hiatal hernia, normal duodenum.  Marland Kitchen KNEE SURGERY     x2  .  POLYPECTOMY  09/03/2020   Procedure: POLYPECTOMY;  Surgeon: Daneil Dolin, MD;  [facility];  Service: Endoscopy;;  . ROTATOR CUFF REPAIR     x2  . SHOULDER ARTHROSCOPY WITH ROTATOR CUFF REPAIR AND SUBACROMIAL DECOMPRESSION Right 01/21/2015   Procedure: RIGHT SHOULDER ARTHROSCOPIC DEBRIDEMNT OF G-H JOINT AND REMOVAL OF LOOSE BODIES,ARTHROSCOPIC SUBACROMIAL DECOMPRESSION,MINI OPEN RCT REPAIR WITH SUPPLEMENTAL [facility] PATCH;  Surgeon: Garald Balding, MD;  Location: [place];  Service: Orthopedics;  Laterality: Right;  . TONSILLECTOMY    . TRIGGER FINGER RELEASE    . UVULOPALATOPHARYNGOPLASTY      FAMILY HISTORY Family History  Problem Relation Age of Onset  . Cancer Mother   . Cancer Father   . Diabetes Sister   . Colon cancer Neg  Hx   . Liver disease Neg Hx     SOCIAL HISTORY Social History   Tobacco Use  . Smoking status: Former Smoker    Packs/day: 0.10    Years: 48.00    Pack years: 4.80    Types: Cigarettes    Quit date: 05/11/2005    Years since quitting: 15.5  . Smokeless tobacco: Never Used  Substance Use Topics  . Alcohol use: No    Alcohol/week: 0.0 standard drinks  . Drug use: No         OPHTHALMIC EXAM:  Base Eye Exam    Visual Acuity (ETDRS)      Right Left   Dist cc 20/20 -1 20/50 +2   Dist ph cc  20/40 -2   Correction: Glasses       Tonometry (Tonopen, 2:34 PM)      Right Left   Pressure 17 18       Pupils      Pupils Dark Light Shape React APD   Right PERRL 4 3 Round Brisk None   Left PERRL 4 3 Round Brisk None       Visual Fields (Counting fingers)      Left Right    Full Full       Extraocular Movement      Right Left    Full Full       Neuro/Psych    Oriented x3: Yes   Mood/Affect: Normal       Dilation    Left eye: 1.0% Mydriacyl, 2.5% Phenylephrine @ 2:38 PM        Slit Lamp and Fundus Exam    External Exam      Right Left   External Normal Normal       Slit Lamp Exam      Right Left    Lids/Lashes Normal Normal   Conjunctiva/Sclera White and quiet White and quiet   Cornea Clear Clear   Anterior Chamber Deep and quiet Deep and quiet   Iris Round and reactive Round and reactive   Lens Posterior chamber intraocular lens Posterior chamber intraocular lens   Anterior Vitreous Normal Normal       Fundus Exam      Right Left   Posterior Vitreous  Normal   Disc  Normal   C/D Ratio  0.5   Macula  Macular thickening, Retinal pigment epithelial detachment,, subretinal fluid inferior to the fovea.  Subretinal white fibrotic area corresponds with the RPE detachment noted on OCT temporal to the fovea.   Vessels  Normal   Periphery  Normal          IMAGING AND PROCEDURES  Imaging and Procedures for 11/24/20  OCT, Retina - OU - Both Eyes       Right Eye Quality was good. Scan locations included subfoveal. Central Foveal Thickness: 270. Findings include vitreomacular adhesion .   Left Eye Quality was good. Scan locations included subfoveal. Central Foveal Thickness: 408. Progression has improved. Findings include vitreomacular adhesion .   Notes Much smaller subretinal fluid component to this vascularized pigment epithelial detachment       Intravitreal Injection, Pharmacologic Agent - OS - Left Eye       Time Out 11/24/2020. 3:52 PM. Confirmed correct patient, procedure, site, and patient consented.   Anesthesia Topical anesthesia was used. Anesthetic medications included Akten 3.5%.   Procedure Preparation included Ofloxacin , 10% betadine to eyelids, 5% betadine to ocular surface. A 30 gauge needle was  used.   Injection:  2.5 mg Bevacizumab (AVASTIN) 2.5mg /0.69mL SOSY   NDC: 26378-588-50, Lot: [id number]   Route: Intravitreal, Site: Left Eye  Post-op Post injection exam found visual acuity of at least counting fingers. The patient tolerated the procedure well. There were no complications. The patient received written and verbal post procedure care education.  Post injection medications were not given.                 ASSESSMENT/PLAN:  Exudative age-related macular degeneration of left eye with active choroidal neovascularization (HCC) Improved subretinal fluid possibly from vascularized pigment epithelial detachment left eye subfoveal location, improved at 6-week interval will repeat injection today and follow-up again in 6 weeks  Of note coincidentally she is tapering her prednisone systemically now 2 and half milligrams daily which also could help with the serous component of this disease  Acute central serous retinopathy with subretinal fluid, left Component of wet ARMD, improving on intravitreal Avastin, 6 weeks previous, also with tapering of systemic prednisone use      ICD-10-CM   1. Exudative age-related macular degeneration of left eye with active choroidal neovascularization (HCC)  H35.3221 Intravitreal Injection, Pharmacologic Agent - OS - Left Eye    bevacizumab (AVASTIN) SOSY 2.5 mg  2. Acute central serous retinopathy with subretinal fluid, left  H35.712 OCT, Retina - OU - Both Eyes    1.  [place] kidney note reviewed.  2.  Patient is on a tapering dose of prednisone.  Active condition is improving also coincident with 6-week follow-up post Avastin OS  3.  Ophthalmic Meds Ordered this visit:  Meds ordered this encounter  Medications  . bevacizumab (AVASTIN) SOSY 2.5 mg       Return in about 6 weeks (around 01/05/2021) for dilate, OS, AVASTIN OCT.  There are no Patient Instructions on file for this visit.   Explained the diagnoses, plan, and follow up with the patient and they expressed understanding.  Patient expressed understanding of the importance of proper follow up care.   Clent Demark Madalen Gavin M.D. Diseases & Surgery of the Retina and Vitreous Retina & Diabetic [place] 11/24/20     Abbreviations: M myopia (nearsighted); A astigmatism; H hyperopia (farsighted); P presbyopia; Mrx spectacle  prescription;  CTL contact lenses; OD right eye; OS left eye; OU both eyes  XT exotropia; ET esotropia; PEK punctate epithelial keratitis; PEE punctate epithelial erosions; DES dry eye syndrome; MGD meibomian gland dysfunction; ATs artificial tears; PFAT's preservative free artificial tears; Christine nuclear sclerotic cataract; PSC posterior subcapsular cataract; ERM epi-retinal membrane; PVD posterior vitreous detachment; RD retinal detachment; DM diabetes mellitus; DR diabetic retinopathy; NPDR non-proliferative diabetic retinopathy; PDR proliferative diabetic retinopathy; CSME clinically significant macular edema; DME diabetic macular edema; dbh dot blot hemorrhages; CWS cotton wool spot; POAG primary open angle glaucoma; C/D cup-to-disc ratio; HVF humphrey visual field; GVF goldmann visual field; OCT optical coherence tomography; IOP intraocular pressure; BRVO Branch retinal vein occlusion; CRVO central retinal vein occlusion; CRAO central retinal artery occlusion; BRAO branch retinal artery occlusion; RT retinal tear; SB scleral buckle; PPV pars plana vitrectomy; VH Vitreous hemorrhage; PRP panretinal laser photocoagulation; IVK intravitreal kenalog; VMT vitreomacular traction; MH Macular hole;  NVD neovascularization of the disc; NVE neovascularization elsewhere; AREDS age related eye disease study; ARMD age related macular degeneration; POAG primary open angle glaucoma; EBMD epithelial/anterior basement membrane dystrophy; ACIOL anterior chamber intraocular lens; IOL intraocular lens; PCIOL posterior chamber intraocular lens; Phaco/IOL phacoemulsification with intraocular lens placement; PRK photorefractive keratectomy; LASIK laser assisted  in situ keratomileusis; HTN hypertension; DM diabetes mellitus; COPD chronic obstructive pulmonary disease

## 2020-11-25 ENCOUNTER — Other Ambulatory Visit (HOSPITAL_COMMUNITY): Payer: Self-pay | Admitting: Nephrology

## 2020-11-25 MED FILL — CYCLOPHOSPHAMIDE 25 MG CAPS: 25 | 30 days supply | Qty: 90 | Fill #0

## 2020-12-18 MED FILL — CYCLOPHOSPHAMIDE 25 MG CAPS: 25 | 30 days supply | Qty: 90 | Fill #0

## 2020-12-25 DIAGNOSIS — N184 Chronic kidney disease, stage 4 (severe): Secondary | ICD-10-CM | POA: Diagnosis not present

## 2020-12-29 DIAGNOSIS — N058 Unspecified nephritic syndrome with other morphologic changes: Secondary | ICD-10-CM | POA: Diagnosis not present

## 2020-12-29 DIAGNOSIS — N184 Chronic kidney disease, stage 4 (severe): Secondary | ICD-10-CM | POA: Diagnosis not present

## 2020-12-29 DIAGNOSIS — E785 Hyperlipidemia, unspecified: Secondary | ICD-10-CM | POA: Diagnosis not present

## 2020-12-29 DIAGNOSIS — I129 Hypertensive chronic kidney disease with stage 1 through stage 4 chronic kidney disease, or unspecified chronic kidney disease: Secondary | ICD-10-CM | POA: Diagnosis not present

## 2020-12-29 DIAGNOSIS — D84821 Immunodeficiency due to drugs: Secondary | ICD-10-CM | POA: Diagnosis not present

## 2020-12-29 DIAGNOSIS — E872 Acidosis: Secondary | ICD-10-CM | POA: Diagnosis not present

## 2020-12-29 DIAGNOSIS — D631 Anemia in chronic kidney disease: Secondary | ICD-10-CM | POA: Diagnosis not present

## 2020-12-29 DIAGNOSIS — N057 Unspecified nephritic syndrome with diffuse crescentic glomerulonephritis: Secondary | ICD-10-CM | POA: Diagnosis not present

## 2020-12-29 DIAGNOSIS — K219 Gastro-esophageal reflux disease without esophagitis: Secondary | ICD-10-CM | POA: Diagnosis not present

## 2020-12-29 DIAGNOSIS — N2581 Secondary hyperparathyroidism of renal origin: Secondary | ICD-10-CM | POA: Diagnosis not present

## 2020-12-29 DIAGNOSIS — N179 Acute kidney failure, unspecified: Secondary | ICD-10-CM | POA: Diagnosis not present

## 2020-12-29 DIAGNOSIS — Z79899 Other long term (current) drug therapy: Secondary | ICD-10-CM | POA: Diagnosis not present

## 2021-01-05 ENCOUNTER — Encounter (INDEPENDENT_AMBULATORY_CARE_PROVIDER_SITE_OTHER): Payer: Medicare Other | Admitting: Ophthalmology

## 2021-01-09 ENCOUNTER — Ambulatory Visit (HOSPITAL_COMMUNITY)
Admission: RE | Admit: 2021-01-09 | Discharge: 2021-01-09 | Disposition: A | Discharge status: Home / Self Care | Payer: Medicare Other | Source: Ambulatory Visit | Attending: Nephrology | Admitting: Nephrology

## 2021-01-09 ENCOUNTER — Other Ambulatory Visit: Payer: Self-pay

## 2021-01-09 VITALS — BP 144/72 | HR 54 | Temp 96.8°F | Resp 20

## 2021-01-09 DIAGNOSIS — N189 Chronic kidney disease, unspecified: Secondary | ICD-10-CM | POA: Diagnosis not present

## 2021-01-09 DIAGNOSIS — D631 Anemia in chronic kidney disease: Secondary | ICD-10-CM | POA: Diagnosis not present

## 2021-01-09 LAB — POCT HEMOGLOBIN-HEMACUE: Hemoglobin: 10.2 g/dL — ABNORMAL LOW (ref 12.0–15.0)

## 2021-01-09 LAB — FERRITIN: Ferritin: 872 ng/mL — ABNORMAL HIGH (ref 11–307)

## 2021-01-09 LAB — IRON AND TIBC
Iron: 87 ug/dL (ref 28–170)
Saturation Ratios: 25 % (ref 10.4–31.8)
TIBC: 347 ug/dL (ref 250–450)
UIBC: 260 ug/dL

## 2021-01-09 MED ORDER — EPOETIN ALFA-EPBX 10000 UNIT/ML IJ SOLN
INTRAMUSCULAR | Status: AC
  Administered 2021-01-09: 10000 [IU] via SUBCUTANEOUS
  Filled 2021-01-09: qty 1

## 2021-01-09 MED ORDER — EPOETIN ALFA-EPBX 10000 UNIT/ML IJ SOLN: 10000.0000 [IU] | INTRAMUSCULAR | Status: DC

## 2021-01-12 ENCOUNTER — Other Ambulatory Visit: Payer: Self-pay

## 2021-01-12 ENCOUNTER — Encounter (INDEPENDENT_AMBULATORY_CARE_PROVIDER_SITE_OTHER): Payer: Self-pay | Admitting: Ophthalmology

## 2021-01-12 ENCOUNTER — Ambulatory Visit (INDEPENDENT_AMBULATORY_CARE_PROVIDER_SITE_OTHER): Payer: Medicare Other | Admitting: Ophthalmology

## 2021-01-12 DIAGNOSIS — H43822 Vitreomacular adhesion, left eye: Secondary | ICD-10-CM | POA: Diagnosis not present

## 2021-01-12 DIAGNOSIS — H43821 Vitreomacular adhesion, right eye: Secondary | ICD-10-CM

## 2021-01-12 DIAGNOSIS — H353221 Exudative age-related macular degeneration, left eye, with active choroidal neovascularization: Secondary | ICD-10-CM

## 2021-01-12 DIAGNOSIS — H35712 Central serous chorioretinopathy, left eye: Secondary | ICD-10-CM

## 2021-01-12 HISTORY — DX: Vitreomacular adhesion, right eye: H43.821

## 2021-01-12 MED ORDER — BEVACIZUMAB 2.5 MG/0.1ML IZ SOSY
2.5000 mg | PREFILLED_SYRINGE | INTRAVITREAL | Status: AC | PRN
  Administered 2021-01-12: 2.5 mg via INTRAVITREAL

## 2021-01-12 NOTE — Assessment & Plan Note (Signed)
Vitreomacular traction may cause vision loss from anatomic distortion to the center of the vision, the macula.  If visual function is symptomatic or threatened, therapy may be needed.  Surgical intervention offers the highest chance of visual stability and improvement.  Distortion of the macula anatomy may cause splitting of the retinal layers, termed foveomacular retinoschisis, which can cause more permanent vision loss.  Epiretinal membranes may also be associated.  Macular hole may also develop if vitreomacular traction progresses. The minor form of this condition is Vitreomacular adhesion, which is a natural change in the aging process of the eye, which requires observation only.  Broad-based vitreoretinal traction may be contributing to serous elevation, if condition does not medically improve may consider vitrectomy in the future

## 2021-01-12 NOTE — Assessment & Plan Note (Signed)
Associated with serous elevation and retinal pigment epithelial detachment.  We will repeat injection Avastin OS today  Notably patient is off prednisone now

## 2021-01-12 NOTE — Assessment & Plan Note (Signed)
Has completed course of prednisone

## 2021-01-12 NOTE — Progress Notes (Signed)
01/12/2021     CHIEF COMPLAINT Patient presents for Retina Follow Up (6 WK FU OS, POSS AVASTIN OS////Pt reports some itching that comes and goes OU, pt reports that "sometimes my glasses aren't strong enough", pt denies any new F/F, pain, or pressure OU. //)   HISTORY OF PRESENT ILLNESS: Cheryl [name] is a 75 y.o. female who presents to the clinic today for:   HPI    Retina Follow Up    Patient presents with  Wet AMD.  In left eye.  This started 6 weeks ago.  Duration of 6 weeks.  Since onset it is stable. Additional comments: 6 WK FU OS, POSS AVASTIN OS    Pt reports some itching that comes and goes OU, pt reports that "sometimes my glasses aren't strong enough", pt denies any new F/F, pain, or pressure OU.          Last edited by Nichola Sizer D on 01/12/2021  3:03 PM. (History)      Referring physician: Jake Samples, PA-C [place],  [place] [address]  HISTORICAL INFORMATION:   Selected notes from the MEDICAL RECORD NUMBER       CURRENT MEDICATIONS: No current outpatient medications on file. (Ophthalmic Drugs)   No current facility-administered medications for this visit. (Ophthalmic Drugs)   Current Outpatient Medications (Other)  Medication Sig  . acetaminophen (TYLENOL) 500 MG tablet Take 500-1,000 mg by mouth as needed (for pain/headache).   Marland Kitchen albuterol (VENTOLIN HFA) 108 (90 Base) MCG/ACT inhaler Inhale 2 puffs into the lungs every 6 (six) hours as needed for wheezing or shortness of breath.  Marland Kitchen buPROPion (WELLBUTRIN XL) 300 MG 24 hr tablet Take 300 mg by mouth daily.  . Cholecalciferol (VITAMIN D3) 125 MCG (5000 UT) CAPS Take 1 capsule by mouth daily.   . Cyanocobalamin (VITAMIN B-12) 5000 MCG TBDP Take 1,000 mcg by mouth daily.   . cyclophosphamide (CYTOXAN) 25 MG capsule Take 25 mg by mouth daily.   . diclofenac sodium (VOLTAREN) 1 % GEL Apply 2-4 g topically 4 (four) times daily. (Patient taking differently: Apply 2-4 g topically  as needed. )  . fluticasone (FLONASE) 50 MCG/ACT nasal spray Place 2 sprays into both nostrils daily as needed for allergies or rhinitis.  . furosemide (LASIX) 80 MG tablet as needed.   Marland Kitchen guaiFENesin (MUCINEX) 600 MG 12 hr tablet Take 1 tablet (600 mg total) by mouth 2 (two) times daily. (Patient taking differently: Take 600 mg by mouth 2 (two) times daily as needed. )  . LINZESS 290 MCG CAPS capsule TAKE 1 CAPSULE BY MOUTH DAILY BEFORE BREAKFAST.  Marland Kitchen losartan (COZAAR) 25 MG tablet Take 12.5 mg by mouth at bedtime.  . metoprolol succinate (TOPROL-XL) 50 MG 24 hr tablet Take 50 mg by mouth daily. Take with or immediately following a meal.  . montelukast (SINGULAIR) 10 MG tablet Take 1 tablet (10 mg total) by mouth every morning.  . nystatin (MYCOSTATIN) 100000 UNIT/ML suspension Take 5 mLs by mouth 2 (two) times daily as needed (for thrush).   . ondansetron (ZOFRAN) 4 MG tablet Take 4 mg by mouth as needed for nausea or vomiting.   . pantoprazole (PROTONIX) 40 MG tablet TAKE 1 TABLET (40 MG TOTAL) BY MOUTH 2 (TWO) TIMES DAILY BEFORE A MEAL.  Marland Kitchen polyethylene glycol (MIRALAX / GLYCOLAX) packet Take 17 g by mouth daily as needed for mild constipation or moderate constipation.   . predniSONE (DELTASONE) 20 MG tablet Take 15 mg  by mouth daily.   . raloxifene (EVISTA) 60 MG tablet Take 60 mg by mouth daily.  Marland Kitchen rOPINIRole (REQUIP) 0.25 MG tablet Take 0.75 mg by mouth daily as needed (for rls).   . rosuvastatin (CRESTOR) 10 MG tablet Take 10 mg by mouth at bedtime.  . tolterodine (DETROL) 1 MG tablet Take 1 mg by mouth as needed.   . Vitamin D, Ergocalciferol, (DRISDOL) 1.25 MG (50000 UNIT) CAPS capsule Take 50,000 Units by mouth every 7 (seven) days.   No current facility-administered medications for this visit. (Other)      REVIEW OF SYSTEMS:    ALLERGIES Allergies  Allergen Reactions  . Prednisone Other (See Comments)    Hallucinations   . Tramadol Nausea Only  . Aspirin Nausea And Vomiting   . Celecoxib Rash    PAST MEDICAL HISTORY Past Medical History:  Diagnosis Date  . Acid reflux   . Arthritis   . CKD (chronic kidney disease)   . Constipation   . GERD (gastroesophageal reflux disease)   . Neuromuscular disorder ([place])   . PUD (peptic ulcer disease) 2017   gastric ulcer healed on repeat EGD in August 2017  . Sinus drainage   . Sleep apnea    had surgery to correct   Past Surgical History:  Procedure Laterality Date  . ANTERIOR CERVICAL DECOMP/DISCECTOMY FUSION N/A 11/19/2016   Procedure: ANTERIOR CERVICAL DISCECTOMY AND FUSION C5-6 WITH PLATES, SCREWS, CAGE, VIVIGEN II;  Surgeon: Jessy Oto, MD;  Location: [facility];  Service: Orthopedics;  Laterality: N/A;  . CARPAL TUNNEL RELEASE    . CATARACT EXTRACTION, BILATERAL Bilateral 2017   One in December 2017 and one in January 2018  . COLONOSCOPY  02/15/2005   NLZ:JQBHA small polyps ablated via cold biopsy, one from transverse colon and two from the rectum/Small external hemorrhoids  . COLONOSCOPY  07/10/2010   [id number] rectum/long redundant colon, polyps in the sigmoid, descending, hepatic flexure/ADENOMATOUS POLYPS. next TCS due 06/2015  . COLONOSCOPY  1995   3 polyps, path revealed chronic colitis  . COLONOSCOPY N/A 08/05/2015   Procedure: COLONOSCOPY;  Surgeon: Daneil Dolin, MD; melanosis coli, colonic diverticulosis. Repeat colonoscopy in 5 years for surveillance.  . COLONOSCOPY N/A 09/03/2020   Procedure: COLONOSCOPY;  Surgeon: Daneil Dolin, MD;  [facility];  Service: Endoscopy;  Laterality: N/A;  11:15am  . ESOPHAGOGASTRODUODENOSCOPY  1995   Gastritis  . ESOPHAGOGASTRODUODENOSCOPY N/A 04/16/2013   RMR: HH  . ESOPHAGOGASTRODUODENOSCOPY N/A 05/13/2016   Procedure: ESOPHAGOGASTRODUODENOSCOPY (EGD);  Surgeon: Daneil Dolin, MD; gastric ulcer and erosions s/p biopsy, erosive gastropathy. Path of reactive and regenerative changes, no H. pylori.  . ESOPHAGOGASTRODUODENOSCOPY N/A 08/18/2016    Procedure: ESOPHAGOGASTRODUODENOSCOPY (EGD);  Surgeon: Daneil Dolin, MD; normal esophagus, previous gastric ulcer completely healed, small hiatal hernia, normal duodenum.  Marland Kitchen KNEE SURGERY     x2  . POLYPECTOMY  09/03/2020   Procedure: POLYPECTOMY;  Surgeon: Daneil Dolin, MD;  [facility];  Service: Endoscopy;;  . ROTATOR CUFF REPAIR     x2  . SHOULDER ARTHROSCOPY WITH ROTATOR CUFF REPAIR AND SUBACROMIAL DECOMPRESSION Right 01/21/2015   Procedure: RIGHT SHOULDER ARTHROSCOPIC DEBRIDEMNT OF G-H JOINT AND REMOVAL OF LOOSE BODIES,ARTHROSCOPIC SUBACROMIAL DECOMPRESSION,MINI OPEN RCT REPAIR WITH SUPPLEMENTAL [facility] PATCH;  Surgeon: Garald Balding, MD;  Location: [facility];  Service: Orthopedics;  Laterality: Right;  . TONSILLECTOMY    . TRIGGER FINGER RELEASE    . UVULOPALATOPHARYNGOPLASTY      FAMILY  HISTORY Family History  Problem Relation Age of Onset  . Cancer Mother   . Cancer Father   . Diabetes Sister   . Colon cancer Neg Hx   . Liver disease Neg Hx     SOCIAL HISTORY Social History   Tobacco Use  . Smoking status: Former Smoker    Packs/day: 0.10    Years: 48.00    Pack years: 4.80    Types: Cigarettes    Quit date: 05/11/2005    Years since quitting: 15.6  . Smokeless tobacco: Never Used  Substance Use Topics  . Alcohol use: No    Alcohol/week: 0.0 standard drinks  . Drug use: No         OPHTHALMIC EXAM: Base Eye Exam    Visual Acuity (ETDRS)      Right Left   Dist cc 20/25 +1 20/30 +2   Dist ph cc  20/20 -2   Correction: Glasses       Tonometry (Tonopen, 3:08 PM)      Right Left   Pressure 12 13       Pupils      Pupils Dark Light Shape React APD   Right PERRL 4 3 Round Brisk None   Left PERRL 4 3 Round Brisk None       Visual Fields (Counting fingers)      Left Right    Full Full       Extraocular Movement      Right Left    Full Full       Neuro/Psych    Oriented x3: Yes   Mood/Affect: Normal       Dilation    Left  eye: 1.0% Mydriacyl, 2.5% Phenylephrine @ 3:08 PM        Slit Lamp and Fundus Exam    External Exam      Right Left   External Normal Normal       Slit Lamp Exam      Right Left   Lids/Lashes Normal Normal   Conjunctiva/Sclera White and quiet White and quiet   Cornea Clear Clear   Anterior Chamber Deep and quiet Deep and quiet   Iris Round and reactive Round and reactive   Lens Posterior chamber intraocular lens Posterior chamber intraocular lens   Anterior Vitreous Normal Normal       Fundus Exam      Right Left   Posterior Vitreous  Normal   Disc  Normal   C/D Ratio  0.5   Macula  Macular thickening, Retinal pigment epithelial detachment,, subretinal fluid inferior to the fovea.  Subretinal white fibrotic area corresponds with the RPE detachment noted on OCT temporal to the fovea.   Vessels  Normal   Periphery  Normal          IMAGING AND PROCEDURES  Imaging and Procedures for 01/12/21  OCT, Retina - OU - Both Eyes       Right Eye Quality was good. Central Foveal Thickness: 290. Progression has been stable. Findings include vitreomacular adhesion , vitreous traction.   Left Eye Quality was good. Scan locations included subfoveal. Central Foveal Thickness: 361. Progression has been stable. Findings include vitreous traction, vitreomacular adhesion , subretinal fluid, pigment epithelial detachment.   Notes Vitreomacular traction with inner foveal distortion right eye with no acuity change we will continue to monitor  OS broad based vitreomacular traction may be contributing to ongoing serous retinal detachment associated with pigment epithelial detachment.  Intravitreal Injection, Pharmacologic Agent - OS - Left Eye       Time Out 01/12/2021. 3:51 PM. Confirmed correct patient, procedure, site, and patient consented.   Anesthesia Topical anesthesia was used. Anesthetic medications included Akten 3.5%.   Procedure Preparation included Ofloxacin ,  10% betadine to eyelids, 5% betadine to ocular surface, Tobramycin 0.3%. A 30 gauge needle was used.   Injection:  2.5 mg Bevacizumab (AVASTIN) 2.5mg /0.17mL SOSY   NDC: 94496-759-16, Lot: [id number]   Route: Intravitreal, Site: Left Eye  Post-op Post injection exam found visual acuity of at least counting fingers. The patient tolerated the procedure well. There were no complications. The patient received written and verbal post procedure care education. Post injection medications were not given.                 ASSESSMENT/PLAN:  Vitreomacular adhesion of right eye Vitreomacular traction may cause vision loss from anatomic distortion to the center of the vision, the macula.  If visual function is symptomatic or threatened, therapy may be needed.  Surgical intervention offers the highest chance of visual stability and improvement.  Distortion of the macula anatomy may cause splitting of the retinal layers, termed foveomacular retinoschisis, which can cause more permanent vision loss.  Epiretinal membranes may also be associated.  Macular hole may also develop if vitreomacular traction progresses. The minor form of this condition is Vitreomacular adhesion, which is a natural change in the aging process of the eye, which requires observation only.  Minor inner foveal elevation, no impact on acuity will observe  Vitreomacular traction syndrome, left Vitreomacular traction may cause vision loss from anatomic distortion to the center of the vision, the macula.  If visual function is symptomatic or threatened, therapy may be needed.  Surgical intervention offers the highest chance of visual stability and improvement.  Distortion of the macula anatomy may cause splitting of the retinal layers, termed foveomacular retinoschisis, which can cause more permanent vision loss.  Epiretinal membranes may also be associated.  Macular hole may also develop if vitreomacular traction progresses. The minor form of  this condition is Vitreomacular adhesion, which is a natural change in the aging process of the eye, which requires observation only.  Broad-based vitreoretinal traction may be contributing to serous elevation, if condition does not medically improve may consider vitrectomy in the future  Exudative age-related macular degeneration of left eye with active choroidal neovascularization (HCC) Associated with serous elevation and retinal pigment epithelial detachment.  We will repeat injection Avastin OS today  Notably patient is off prednisone now  Acute central serous retinopathy with subretinal fluid, left Has completed course of prednisone      ICD-10-CM   1. Exudative age-related macular degeneration of left eye with active choroidal neovascularization (HCC)  H35.3221 OCT, Retina - OU - Both Eyes    Intravitreal Injection, Pharmacologic Agent - OS - Left Eye    bevacizumab (AVASTIN) SOSY 2.5 mg  2. Vitreomacular adhesion of right eye  H43.821   3. Vitreomacular traction syndrome, left  H43.822   4. Acute central serous retinopathy with subretinal fluid, left  H35.712     1.  We will repeat intravitreal Avastin OS today.  Coincidentally the serous component of this disease is stable with good acuity moreover the patient has recently ceased use of prednisone which was used for a renal condition.  May encourage further resolution of the central serous component of this disease  2.  Vitreomacular adhesion in a broad-based fashion may also be  contributing to the retinal elevation left eye will continue to monitor this condition  3.  Follow-up examination in 6 weeks left eye  Ophthalmic Meds Ordered this visit:  Meds ordered this encounter  Medications  . bevacizumab (AVASTIN) SOSY 2.5 mg       Return in about 6 weeks (around 02/23/2021) for dilate, OS, AVASTIN OCT.  There are no Patient Instructions on file for this visit.   Explained the diagnoses, plan, and follow up with the  patient and they expressed understanding.  Patient expressed understanding of the importance of proper follow up care.   Clent Demark Zalika Tieszen M.D. Diseases & Surgery of the Retina and Vitreous [facility] 01/12/21     Abbreviations: M myopia (nearsighted); A astigmatism; H hyperopia (farsighted); P presbyopia; Mrx spectacle prescription;  CTL contact lenses; OD right eye; OS left eye; OU both eyes  XT exotropia; ET esotropia; PEK punctate epithelial keratitis; PEE punctate epithelial erosions; DES dry eye syndrome; MGD meibomian gland dysfunction; ATs artificial tears; PFAT's preservative free artificial tears; [place] nuclear sclerotic cataract; PSC posterior subcapsular cataract; ERM epi-retinal membrane; PVD posterior vitreous detachment; RD retinal detachment; DM diabetes mellitus; DR diabetic retinopathy; NPDR non-proliferative diabetic retinopathy; PDR proliferative diabetic retinopathy; CSME clinically significant macular edema; DME diabetic macular edema; dbh dot blot hemorrhages; CWS cotton wool spot; POAG primary open angle glaucoma; C/D cup-to-disc ratio; HVF humphrey visual field; GVF goldmann visual field; OCT optical coherence tomography; IOP intraocular pressure; BRVO Branch retinal vein occlusion; CRVO central retinal vein occlusion; CRAO central retinal artery occlusion; BRAO branch retinal artery occlusion; RT retinal tear; SB scleral buckle; PPV pars plana vitrectomy; VH Vitreous hemorrhage; PRP panretinal laser photocoagulation; IVK intravitreal kenalog; VMT vitreomacular traction; MH Macular hole;  NVD neovascularization of the disc; NVE neovascularization elsewhere; AREDS age related eye disease study; ARMD age related macular degeneration; POAG primary open angle glaucoma; EBMD epithelial/anterior basement membrane dystrophy; ACIOL anterior chamber intraocular lens; IOL intraocular lens; PCIOL posterior chamber intraocular lens; Phaco/IOL phacoemulsification with intraocular  lens placement; [place] photorefractive keratectomy; LASIK laser assisted in situ keratomileusis; HTN hypertension; DM diabetes mellitus; COPD chronic obstructive pulmonary disease

## 2021-01-12 NOTE — Assessment & Plan Note (Signed)
Vitreomacular traction may cause vision loss from anatomic distortion to the center of the vision, the macula.  If visual function is symptomatic or threatened, therapy may be needed.  Surgical intervention offers the highest chance of visual stability and improvement.  Distortion of the macula anatomy may cause splitting of the retinal layers, termed foveomacular retinoschisis, which can cause more permanent vision loss.  Epiretinal membranes may also be associated.  Macular hole may also develop if vitreomacular traction progresses. The minor form of this condition is Vitreomacular adhesion, which is a natural change in the aging process of the eye, which requires observation only.  Minor inner foveal elevation, no impact on acuity will observe

## 2021-01-23 ENCOUNTER — Other Ambulatory Visit: Payer: Self-pay

## 2021-01-23 ENCOUNTER — Encounter (HOSPITAL_COMMUNITY)
Admission: RE | Admit: 2021-01-23 | Discharge: 2021-01-23 | Disposition: A | Discharge status: Home / Self Care | Payer: Medicare Other | Source: Ambulatory Visit | Attending: Nephrology | Admitting: Nephrology

## 2021-01-23 VITALS — BP 127/69 | HR 70 | Temp 97.5°F | Resp 20

## 2021-01-23 DIAGNOSIS — D631 Anemia in chronic kidney disease: Secondary | ICD-10-CM | POA: Insufficient documentation

## 2021-01-23 DIAGNOSIS — N189 Chronic kidney disease, unspecified: Secondary | ICD-10-CM | POA: Diagnosis not present

## 2021-01-23 LAB — POCT HEMOGLOBIN-HEMACUE: Hemoglobin: 10.7 g/dL — ABNORMAL LOW (ref 12.0–15.0)

## 2021-01-23 MED ORDER — EPOETIN ALFA-EPBX 10000 UNIT/ML IJ SOLN
10000.0000 [IU] | INTRAMUSCULAR | Status: DC
  Administered 2021-01-23: 10000 [IU] via SUBCUTANEOUS

## 2021-01-23 MED ORDER — EPOETIN ALFA-EPBX 10000 UNIT/ML IJ SOLN
INTRAMUSCULAR | Status: AC
  Filled 2021-01-23: qty 1

## 2021-01-27 DIAGNOSIS — N184 Chronic kidney disease, stage 4 (severe): Secondary | ICD-10-CM | POA: Diagnosis not present

## 2021-01-28 MED FILL — CYCLOPHOSPHAMIDE 25 MG CAPS: 25 | 30 days supply | Qty: 90 | Fill #1

## 2021-02-02 ENCOUNTER — Encounter (INDEPENDENT_AMBULATORY_CARE_PROVIDER_SITE_OTHER): Payer: Self-pay

## 2021-02-02 DIAGNOSIS — N179 Acute kidney failure, unspecified: Secondary | ICD-10-CM | POA: Diagnosis not present

## 2021-02-02 DIAGNOSIS — Z79899 Other long term (current) drug therapy: Secondary | ICD-10-CM | POA: Diagnosis not present

## 2021-02-02 DIAGNOSIS — N058 Unspecified nephritic syndrome with other morphologic changes: Secondary | ICD-10-CM | POA: Diagnosis not present

## 2021-02-02 DIAGNOSIS — E872 Acidosis: Secondary | ICD-10-CM | POA: Diagnosis not present

## 2021-02-02 DIAGNOSIS — N057 Unspecified nephritic syndrome with diffuse crescentic glomerulonephritis: Secondary | ICD-10-CM | POA: Diagnosis not present

## 2021-02-02 DIAGNOSIS — N184 Chronic kidney disease, stage 4 (severe): Secondary | ICD-10-CM | POA: Diagnosis not present

## 2021-02-02 DIAGNOSIS — N2581 Secondary hyperparathyroidism of renal origin: Secondary | ICD-10-CM | POA: Diagnosis not present

## 2021-02-02 DIAGNOSIS — K219 Gastro-esophageal reflux disease without esophagitis: Secondary | ICD-10-CM | POA: Diagnosis not present

## 2021-02-02 DIAGNOSIS — I129 Hypertensive chronic kidney disease with stage 1 through stage 4 chronic kidney disease, or unspecified chronic kidney disease: Secondary | ICD-10-CM | POA: Diagnosis not present

## 2021-02-02 DIAGNOSIS — D631 Anemia in chronic kidney disease: Secondary | ICD-10-CM | POA: Diagnosis not present

## 2021-02-02 DIAGNOSIS — E785 Hyperlipidemia, unspecified: Secondary | ICD-10-CM | POA: Diagnosis not present

## 2021-02-02 DIAGNOSIS — D84821 Immunodeficiency due to drugs: Secondary | ICD-10-CM | POA: Diagnosis not present

## 2021-02-06 ENCOUNTER — Encounter (HOSPITAL_COMMUNITY)
Admission: RE | Admit: 2021-02-06 | Discharge: 2021-02-06 | Disposition: A | Discharge status: Home / Self Care | Payer: Medicare Other | Source: Ambulatory Visit | Attending: Nephrology | Admitting: Nephrology

## 2021-02-06 ENCOUNTER — Other Ambulatory Visit: Payer: Self-pay

## 2021-02-06 ENCOUNTER — Encounter (HOSPITAL_COMMUNITY): Payer: Medicare Other

## 2021-02-06 VITALS — BP 134/78 | HR 72 | Temp 98.2°F | Resp 20

## 2021-02-06 DIAGNOSIS — N189 Chronic kidney disease, unspecified: Secondary | ICD-10-CM

## 2021-02-06 DIAGNOSIS — D631 Anemia in chronic kidney disease: Secondary | ICD-10-CM | POA: Diagnosis not present

## 2021-02-06 LAB — POCT HEMOGLOBIN-HEMACUE: Hemoglobin: 10.1 g/dL — ABNORMAL LOW (ref 12.0–15.0)

## 2021-02-06 LAB — IRON AND TIBC
Iron: 96 ug/dL (ref 28–170)
Saturation Ratios: 29 % (ref 10.4–31.8)
TIBC: 329 ug/dL (ref 250–450)
UIBC: 233 ug/dL

## 2021-02-06 LAB — FERRITIN: Ferritin: 684 ng/mL — ABNORMAL HIGH (ref 11–307)

## 2021-02-06 MED ORDER — EPOETIN ALFA-EPBX 10000 UNIT/ML IJ SOLN: 10000.0000 [IU] | INTRAMUSCULAR | Status: DC

## 2021-02-06 MED ORDER — EPOETIN ALFA-EPBX 10000 UNIT/ML IJ SOLN
INTRAMUSCULAR | Status: AC
  Administered 2021-02-06: 10000 [IU] via SUBCUTANEOUS
  Filled 2021-02-06: qty 1

## 2021-02-20 ENCOUNTER — Other Ambulatory Visit: Payer: Self-pay

## 2021-02-20 ENCOUNTER — Ambulatory Visit (HOSPITAL_COMMUNITY)
Admission: RE | Admit: 2021-02-20 | Discharge: 2021-02-20 | Disposition: A | Discharge status: Home / Self Care | Payer: Medicare Other | Source: Ambulatory Visit | Attending: Nephrology | Admitting: Nephrology

## 2021-02-20 VITALS — BP 138/74 | HR 66 | Temp 97.5°F | Resp 20

## 2021-02-20 DIAGNOSIS — N189 Chronic kidney disease, unspecified: Secondary | ICD-10-CM | POA: Diagnosis not present

## 2021-02-20 DIAGNOSIS — D631 Anemia in chronic kidney disease: Secondary | ICD-10-CM | POA: Diagnosis not present

## 2021-02-20 LAB — POCT HEMOGLOBIN-HEMACUE: Hemoglobin: 10.7 g/dL — ABNORMAL LOW (ref 12.0–15.0)

## 2021-02-20 MED ORDER — EPOETIN ALFA-EPBX 10000 UNIT/ML IJ SOLN
INTRAMUSCULAR | Status: AC
  Filled 2021-02-20: qty 1

## 2021-02-20 MED ORDER — EPOETIN ALFA-EPBX 10000 UNIT/ML IJ SOLN
10000.0000 [IU] | INTRAMUSCULAR | Status: DC
  Administered 2021-02-20: 10000 [IU] via SUBCUTANEOUS

## 2021-02-23 ENCOUNTER — Encounter (INDEPENDENT_AMBULATORY_CARE_PROVIDER_SITE_OTHER): Payer: Self-pay | Admitting: Ophthalmology

## 2021-02-23 ENCOUNTER — Ambulatory Visit (INDEPENDENT_AMBULATORY_CARE_PROVIDER_SITE_OTHER): Payer: Medicare Other | Admitting: Ophthalmology

## 2021-02-23 ENCOUNTER — Other Ambulatory Visit: Payer: Self-pay

## 2021-02-23 DIAGNOSIS — H33101 Unspecified retinoschisis, right eye: Secondary | ICD-10-CM | POA: Insufficient documentation

## 2021-02-23 DIAGNOSIS — H35712 Central serous chorioretinopathy, left eye: Secondary | ICD-10-CM | POA: Diagnosis not present

## 2021-02-23 DIAGNOSIS — H43822 Vitreomacular adhesion, left eye: Secondary | ICD-10-CM

## 2021-02-23 DIAGNOSIS — H43821 Vitreomacular adhesion, right eye: Secondary | ICD-10-CM

## 2021-02-23 DIAGNOSIS — H353221 Exudative age-related macular degeneration, left eye, with active choroidal neovascularization: Secondary | ICD-10-CM | POA: Diagnosis not present

## 2021-02-23 HISTORY — DX: Unspecified retinoschisis, right eye: H33.101

## 2021-02-23 MED ORDER — BEVACIZUMAB 2.5 MG/0.1ML IZ SOSY
2.5000 mg | PREFILLED_SYRINGE | INTRAVITREAL | Status: AC | PRN
Start: 2021-02-23 — End: 2021-02-23
  Administered 2021-02-23: 2.5 mg via INTRAVITREAL

## 2021-02-23 NOTE — Assessment & Plan Note (Signed)
Some component of persistent elevation likely from the tractional change seen yet overall the serous component of the retinal detachment from wet AMD, CNVM has improved on therapy will repeat injection today again

## 2021-02-23 NOTE — Assessment & Plan Note (Signed)
Prior adhesion to the fovea now with foveal macular schisis we will continue to monitor with good acuity

## 2021-02-23 NOTE — Assessment & Plan Note (Signed)
Macular findings OS continuing to improve

## 2021-02-23 NOTE — Assessment & Plan Note (Signed)
Improved OS improved OS on intravitreal Avastin will repeat again

## 2021-02-23 NOTE — Progress Notes (Signed)
02/23/2021     CHIEF COMPLAINT Patient presents for Retina Follow Up (6 Wk F/U OS, poss Avastin OS//Pt sts VA OS has gradually improved. Pt sts VA OS still fluctuates, however. Pt denies any new symptoms OU.)   HISTORY OF PRESENT ILLNESS: Cheryl [name] is a 75 y.o. female who presents to the clinic today for:   HPI    Retina Follow Up    Patient presents with  Wet AMD.  In left eye.  This started 6 weeks ago.  Severity is mild.  Duration of 6 weeks.  Since onset it is gradually improving. Additional comments: 6 Wk F/U OS, poss Avastin OS  Pt sts VA OS has gradually improved. Pt sts VA OS still fluctuates, however. Pt denies any new symptoms OU.       Last edited by Rockie Neighbours, [place] on 02/23/2021  2:19 PM. (History)      Referring physician: Jake Samples, PA-C [place],  [place] [id number]  HISTORICAL INFORMATION:   Selected notes from the [facility]: No current outpatient medications on file. (Ophthalmic Drugs)   No current facility-administered medications for this visit. (Ophthalmic Drugs)   Current Outpatient Medications (Other)  Medication Sig  . acetaminophen (TYLENOL) 500 MG tablet Take 500-1,000 mg by mouth as needed (for pain/headache).   Marland Kitchen albuterol (VENTOLIN HFA) 108 (90 Base) MCG/ACT inhaler Inhale 2 puffs into the lungs every 6 (six) hours as needed for wheezing or shortness of breath.  Marland Kitchen buPROPion (WELLBUTRIN XL) 300 MG 24 hr tablet Take 300 mg by mouth daily.  . Cholecalciferol (VITAMIN D3) 125 MCG (5000 UT) CAPS Take 1 capsule by mouth daily.   . Cyanocobalamin (VITAMIN B-12) 5000 MCG TBDP Take 1,000 mcg by mouth daily.   . cyclophosphamide (CYTOXAN) 25 MG capsule Take 25 mg by mouth daily.   . diclofenac sodium (VOLTAREN) 1 % GEL Apply 2-4 g topically 4 (four) times daily. (Patient taking differently: Apply 2-4 g topically as needed. )  . fluticasone (FLONASE) 50 MCG/ACT nasal spray Place 2  sprays into both nostrils daily as needed for allergies or rhinitis.  . furosemide (LASIX) 80 MG tablet as needed.   Marland Kitchen guaiFENesin (MUCINEX) 600 MG 12 hr tablet Take 1 tablet (600 mg total) by mouth 2 (two) times daily. (Patient taking differently: Take 600 mg by mouth 2 (two) times daily as needed. )  . LINZESS 290 MCG CAPS capsule TAKE 1 CAPSULE BY MOUTH DAILY BEFORE BREAKFAST.  Marland Kitchen losartan (COZAAR) 25 MG tablet Take 12.5 mg by mouth at bedtime.  . metoprolol succinate (TOPROL-XL) 50 MG 24 hr tablet Take 50 mg by mouth daily. Take with or immediately following a meal.  . montelukast (SINGULAIR) 10 MG tablet Take 1 tablet (10 mg total) by mouth every morning.  . nystatin (MYCOSTATIN) 100000 UNIT/ML suspension Take 5 mLs by mouth 2 (two) times daily as needed (for thrush).   . ondansetron (ZOFRAN) 4 MG tablet Take 4 mg by mouth as needed for nausea or vomiting.   . pantoprazole (PROTONIX) 40 MG tablet TAKE 1 TABLET (40 MG TOTAL) BY MOUTH 2 (TWO) TIMES DAILY BEFORE A MEAL.  Marland Kitchen polyethylene glycol (MIRALAX / GLYCOLAX) packet Take 17 g by mouth daily as needed for mild constipation or moderate constipation.   . predniSONE (DELTASONE) 20 MG tablet Take 15 mg by mouth daily.   . raloxifene (EVISTA) 60 MG tablet Take 60 mg by  mouth daily.  Marland Kitchen rOPINIRole (REQUIP) 0.25 MG tablet Take 0.75 mg by mouth daily as needed (for rls).   . rosuvastatin (CRESTOR) 10 MG tablet Take 10 mg by mouth at bedtime.  . tolterodine (DETROL) 1 MG tablet Take 1 mg by mouth as needed.   . Vitamin D, Ergocalciferol, (DRISDOL) 1.25 MG (50000 UNIT) CAPS capsule Take 50,000 Units by mouth every 7 (seven) days.   No current facility-administered medications for this visit. (Other)      REVIEW OF SYSTEMS:    ALLERGIES Allergies  Allergen Reactions  . Prednisone Other (See Comments)    Hallucinations   . Tramadol Nausea Only  . Aspirin Nausea And Vomiting  . Celecoxib Rash    PAST MEDICAL HISTORY Past Medical History:   Diagnosis Date  . Acid reflux   . Arthritis   . CKD (chronic kidney disease)   . Constipation   . GERD (gastroesophageal reflux disease)   . Neuromuscular disorder ([place])   . PUD (peptic ulcer disease) 2017   gastric ulcer healed on repeat EGD in August 2017  . Sinus drainage   . Sleep apnea    had surgery to correct   Past Surgical History:  Procedure Laterality Date  . ANTERIOR CERVICAL DECOMP/DISCECTOMY FUSION N/A 11/19/2016   Procedure: ANTERIOR CERVICAL DISCECTOMY AND FUSION C5-6 WITH PLATES, SCREWS, CAGE, VIVIGEN II;  Surgeon: Jessy Oto, MD;  Location: [facility];  Service: Orthopedics;  Laterality: N/A;  . CARPAL TUNNEL RELEASE    . CATARACT EXTRACTION, BILATERAL Bilateral 2017   One in December 2017 and one in January 2018  . COLONOSCOPY  02/15/2005   EGB:TDVVO small polyps ablated via cold biopsy, one from transverse colon and two from the rectum/Small external hemorrhoids  . COLONOSCOPY  07/10/2010   HYW:VPXTGG rectum/long redundant colon, polyps in the sigmoid, descending, hepatic flexure/ADENOMATOUS POLYPS. next TCS due 06/2015  . COLONOSCOPY  1995   3 polyps, path revealed chronic colitis  . COLONOSCOPY N/A 08/05/2015   Procedure: COLONOSCOPY;  Surgeon: Daneil Dolin, MD; melanosis coli, colonic diverticulosis. Repeat colonoscopy in 5 years for surveillance.  . COLONOSCOPY N/A 09/03/2020   Procedure: COLONOSCOPY;  Surgeon: Daneil Dolin, MD;  [facility];  Service: Endoscopy;  Laterality: N/A;  11:15am  . ESOPHAGOGASTRODUODENOSCOPY  1995   Gastritis  . ESOPHAGOGASTRODUODENOSCOPY N/A 04/16/2013   RMR: HH  . ESOPHAGOGASTRODUODENOSCOPY N/A 05/13/2016   Procedure: ESOPHAGOGASTRODUODENOSCOPY (EGD);  Surgeon: Daneil Dolin, MD; gastric ulcer and erosions s/p biopsy, erosive gastropathy. Path of reactive and regenerative changes, no H. pylori.  . ESOPHAGOGASTRODUODENOSCOPY N/A 08/18/2016   Procedure: ESOPHAGOGASTRODUODENOSCOPY (EGD);  Surgeon: Daneil Dolin, MD;  normal esophagus, previous gastric ulcer completely healed, small hiatal hernia, normal duodenum.  Marland Kitchen KNEE SURGERY     x2  . POLYPECTOMY  09/03/2020   Procedure: POLYPECTOMY;  Surgeon: Daneil Dolin, MD;  [facility];  Service: Endoscopy;;  . ROTATOR CUFF REPAIR     x2  . SHOULDER ARTHROSCOPY WITH ROTATOR CUFF REPAIR AND SUBACROMIAL DECOMPRESSION Right 01/21/2015   Procedure: RIGHT SHOULDER ARTHROSCOPIC DEBRIDEMNT OF G-H JOINT AND REMOVAL OF LOOSE BODIES,ARTHROSCOPIC SUBACROMIAL DECOMPRESSION,MINI OPEN RCT REPAIR WITH SUPPLEMENTAL [facility] PATCH;  Surgeon: Garald Balding, MD;  Location: [facility];  Service: Orthopedics;  Laterality: Right;  . TONSILLECTOMY    . TRIGGER FINGER RELEASE    . UVULOPALATOPHARYNGOPLASTY      FAMILY HISTORY Family History  Problem Relation Age of Onset  . Cancer Mother   .  Cancer Father   . Diabetes Sister   . Colon cancer Neg Hx   . Liver disease Neg Hx     SOCIAL HISTORY Social History   Tobacco Use  . Smoking status: Former Smoker    Packs/day: 0.10    Years: 48.00    Pack years: 4.80    Types: Cigarettes    Quit date: 05/11/2005    Years since quitting: 15.8  . Smokeless tobacco: Never Used  Substance Use Topics  . Alcohol use: No    Alcohol/week: 0.0 standard drinks  . Drug use: No         OPHTHALMIC EXAM:  Base Eye Exam    Visual Acuity (ETDRS)      Right Left   Dist cc 20/20 -2 20/40   Dist ph cc  20/40 +2   Correction: Glasses       Tonometry (Tonopen, 2:20 PM)      Right Left   Pressure 14 18       Pupils      Pupils Dark Light Shape React APD   Right PERRL 4 3 Round Brisk None   Left PERRL 4 3 Round Brisk None       Visual Fields (Counting fingers)      Left Right    Full Full       Extraocular Movement      Right Left    Full Full       Neuro/Psych    Oriented x3: Yes   Mood/Affect: Normal       Dilation    Left eye: 1.0% Mydriacyl, 2.5% Phenylephrine @ 2:23 PM        Slit Lamp and  Fundus Exam    External Exam      Right Left   External Normal Normal       Slit Lamp Exam      Right Left   Lids/Lashes Normal Normal   Conjunctiva/Sclera White and quiet White and quiet   Cornea Clear Clear   Anterior Chamber Deep and quiet Deep and quiet   Iris Round and reactive Round and reactive   Lens Posterior chamber intraocular lens Posterior chamber intraocular lens   Anterior Vitreous Normal Normal       Fundus Exam      Right Left   Posterior Vitreous  Normal   Disc  Normal   C/D Ratio 0.5 0.5   Macula Negative Watzke Macular thickening, Retinal pigment epithelial detachment,, subretinal fluid inferior to the fovea.  Subretinal white fibrotic area corresponds with the RPE detachment noted on OCT temporal to the fovea.   Vessels  Normal   Periphery  Normal          IMAGING AND PROCEDURES  Imaging and Procedures for 02/23/21  OCT, Retina - OU - Both Eyes       Right Eye Quality was good. Central Foveal Thickness: 299. Progression has been stable. Findings include vitreomacular adhesion , vitreous traction.   Left Eye Quality was good. Scan locations included subfoveal. Central Foveal Thickness: 289. Progression has been stable. Findings include vitreous traction, vitreomacular adhesion , subretinal fluid, pigment epithelial detachment.   Notes Vitreomacular traction with inner foveal distortion right eye with no acuity change we will continue to monitor, yet with new foveal macular schisis centrally, early change in the outer retina yet with good acuity will continue to observe.  These findings were displayed and reviewed with the patient at the screen  OS broad based  vitreomacular traction may be contributing to ongoing serous retinal detachment associated with pigment epithelial detachment.  OS much improved post recent Avastin and much smaller onset yet with vitreal macular traction and adhesion remaining       Intravitreal Injection, Pharmacologic  Agent - OS - Left Eye       Time Out 02/23/2021. 3:21 PM. Confirmed correct patient, procedure, site, and patient consented.   Anesthesia Topical anesthesia was used.   Procedure Preparation included Ofloxacin . A 30 gauge needle was used.   Injection:  2.5 mg Bevacizumab (AVASTIN) 2.5mg /0.44mL SOSY   NDC: 23762-831-51, Lot: [id number]   Route: Intravitreal, Site: Left Eye  Post-op Post injection exam found visual acuity of at least counting fingers. The patient tolerated the procedure well. There were no complications. The patient received written and verbal post procedure care education. Post injection medications were not given.                 ASSESSMENT/PLAN:  Acute central serous retinopathy with subretinal fluid, left Improved OS improved OS on intravitreal Avastin will repeat again  Vitreomacular traction syndrome, left Some component of persistent elevation likely from the tractional change seen yet overall the serous component of the retinal detachment from wet AMD, CNVM has improved on therapy will repeat injection today again  Exudative age-related macular degeneration of left eye with active choroidal neovascularization (HCC) Macular findings OS continuing to improve  Vitreomacular adhesion of right eye Prior adhesion to the fovea now with foveal macular schisis we will continue to monitor with good acuity  Macular retinoschisis of right eye With good acuity, new secondary to vitreomacular traction, will observe closely reviewed findings with patient      ICD-10-CM   1. Exudative age-related macular degeneration of left eye with active choroidal neovascularization (HCC)  H35.3221 OCT, Retina - OU - Both Eyes    Intravitreal Injection, Pharmacologic Agent - OS - Left Eye    bevacizumab (AVASTIN) SOSY 2.5 mg  2. Acute central serous retinopathy with subretinal fluid, left  H35.712   3. Vitreomacular traction syndrome, left  H43.822   4. Vitreomacular adhesion  of right eye  H43.821   5. Macular retinoschisis of right eye  H33.101     1.  Improved serous component of wet ARMD OS now 6 weeks post injection.  Some residual serous fluid may be from a tractional of vitreomacular traction OS yet we will continue to repeat injection Avastin today and examination again in 6 weeks  2.  Macular findings of the right eye are new with vitreal macular traction now leading to foveal macular retina schisis centrally yet with good acuity will observe.  3.  Ophthalmic Meds Ordered this visit:  Meds ordered this encounter  Medications  . bevacizumab (AVASTIN) SOSY 2.5 mg       Return in about 6 weeks (around 04/06/2021) for dilate, OS, AVASTIN OCT.  There are no Patient Instructions on file for this visit.   Explained the diagnoses, plan, and follow up with the patient and they expressed understanding.  Patient expressed understanding of the importance of proper follow up care.   Clent Demark Juliah Scadden M.D. Diseases & Surgery of the Retina and Vitreous Retina [facility] Diabetic [place] 02/23/21     Abbreviations: M myopia (nearsighted); A astigmatism; H hyperopia (farsighted); P presbyopia; Mrx spectacle prescription;  CTL contact lenses; OD right eye; OS left eye; OU both eyes  XT exotropia; ET esotropia; PEK punctate epithelial keratitis; PEE punctate epithelial erosions;  DES dry eye syndrome; MGD meibomian gland dysfunction; ATs artificial tears; PFAT's preservative free artificial tears; [place] nuclear sclerotic cataract; PSC posterior subcapsular cataract; ERM epi-retinal membrane; PVD posterior vitreous detachment; RD retinal detachment; DM diabetes mellitus; DR diabetic retinopathy; NPDR non-proliferative diabetic retinopathy; PDR proliferative diabetic retinopathy; CSME clinically significant macular edema; DME diabetic macular edema; dbh dot blot hemorrhages; CWS cotton wool spot; POAG primary open angle glaucoma; C/D cup-to-disc ratio; HVF humphrey visual field;  GVF goldmann visual field; OCT optical coherence tomography; IOP intraocular pressure; BRVO Branch retinal vein occlusion; CRVO central retinal vein occlusion; CRAO central retinal artery occlusion; BRAO branch retinal artery occlusion; RT retinal tear; SB scleral buckle; PPV pars plana vitrectomy; VH Vitreous hemorrhage; PRP panretinal laser photocoagulation; IVK intravitreal kenalog; VMT vitreomacular traction; MH Macular hole;  NVD neovascularization of the disc; NVE neovascularization elsewhere; AREDS age related eye disease study; ARMD age related macular degeneration; POAG primary open angle glaucoma; EBMD epithelial/anterior basement membrane dystrophy; ACIOL anterior chamber intraocular lens; IOL intraocular lens; PCIOL posterior chamber intraocular lens; Phaco/IOL phacoemulsification with intraocular lens placement; [place] photorefractive keratectomy; LASIK laser assisted in situ keratomileusis; HTN hypertension; DM diabetes mellitus; COPD chronic obstructive pulmonary disease

## 2021-02-23 NOTE — Assessment & Plan Note (Signed)
With good acuity, new secondary to vitreomacular traction, will observe closely reviewed findings with patient

## 2021-03-04 ENCOUNTER — Other Ambulatory Visit: Payer: Self-pay

## 2021-03-04 ENCOUNTER — Encounter (HOSPITAL_COMMUNITY)
Admission: RE | Admit: 2021-03-04 | Discharge: 2021-03-04 | Disposition: A | Discharge status: Home / Self Care | Payer: Medicare Other | Source: Ambulatory Visit | Attending: Nephrology | Admitting: Nephrology

## 2021-03-04 DIAGNOSIS — D631 Anemia in chronic kidney disease: Secondary | ICD-10-CM | POA: Insufficient documentation

## 2021-03-04 DIAGNOSIS — N189 Chronic kidney disease, unspecified: Secondary | ICD-10-CM | POA: Diagnosis not present

## 2021-03-04 LAB — FERRITIN: Ferritin: 803 ng/mL — ABNORMAL HIGH (ref 11–307)

## 2021-03-04 LAB — POCT HEMOGLOBIN-HEMACUE: Hemoglobin: 10.4 g/dL — ABNORMAL LOW (ref 12.0–15.0)

## 2021-03-04 LAB — IRON AND TIBC
Iron: 81 ug/dL (ref 28–170)
Saturation Ratios: 26 % (ref 10.4–31.8)
TIBC: 307 ug/dL (ref 250–450)
UIBC: 226 ug/dL

## 2021-03-04 MED ORDER — EPOETIN ALFA-EPBX 10000 UNIT/ML IJ SOLN
10000.0000 [IU] | INTRAMUSCULAR | Status: DC
Start: 2021-03-04 — End: 2021-03-05
  Administered 2021-03-04: 10000 [IU] via SUBCUTANEOUS

## 2021-03-04 MED ORDER — EPOETIN ALFA-EPBX 10000 UNIT/ML IJ SOLN
INTRAMUSCULAR | Status: AC
  Filled 2021-03-04: qty 1

## 2021-03-05 ENCOUNTER — Other Ambulatory Visit: Payer: Self-pay | Admitting: Nurse Practitioner

## 2021-03-05 DIAGNOSIS — R110 Nausea: Secondary | ICD-10-CM

## 2021-03-05 DIAGNOSIS — K219 Gastro-esophageal reflux disease without esophagitis: Secondary | ICD-10-CM

## 2021-03-06 ENCOUNTER — Encounter (HOSPITAL_COMMUNITY): Payer: Medicare Other

## 2021-03-09 DIAGNOSIS — N184 Chronic kidney disease, stage 4 (severe): Secondary | ICD-10-CM | POA: Diagnosis not present

## 2021-03-10 ENCOUNTER — Other Ambulatory Visit (HOSPITAL_BASED_OUTPATIENT_CLINIC_OR_DEPARTMENT_OTHER): Payer: Self-pay

## 2021-03-11 MED FILL — CYCLOPHOSPHAMIDE 25 MG CAPS: 25 | 30 days supply | Qty: 90 | Fill #2

## 2021-03-16 ENCOUNTER — Encounter (INDEPENDENT_AMBULATORY_CARE_PROVIDER_SITE_OTHER): Payer: Self-pay

## 2021-03-16 DIAGNOSIS — K219 Gastro-esophageal reflux disease without esophagitis: Secondary | ICD-10-CM | POA: Diagnosis not present

## 2021-03-16 DIAGNOSIS — I129 Hypertensive chronic kidney disease with stage 1 through stage 4 chronic kidney disease, or unspecified chronic kidney disease: Secondary | ICD-10-CM | POA: Diagnosis not present

## 2021-03-16 DIAGNOSIS — N058 Unspecified nephritic syndrome with other morphologic changes: Secondary | ICD-10-CM | POA: Diagnosis not present

## 2021-03-16 DIAGNOSIS — Z79899 Other long term (current) drug therapy: Secondary | ICD-10-CM | POA: Diagnosis not present

## 2021-03-16 DIAGNOSIS — D84821 Immunodeficiency due to drugs: Secondary | ICD-10-CM | POA: Diagnosis not present

## 2021-03-16 DIAGNOSIS — H538 Other visual disturbances: Secondary | ICD-10-CM | POA: Diagnosis not present

## 2021-03-16 DIAGNOSIS — N2581 Secondary hyperparathyroidism of renal origin: Secondary | ICD-10-CM | POA: Diagnosis not present

## 2021-03-16 DIAGNOSIS — E785 Hyperlipidemia, unspecified: Secondary | ICD-10-CM | POA: Diagnosis not present

## 2021-03-16 DIAGNOSIS — N184 Chronic kidney disease, stage 4 (severe): Secondary | ICD-10-CM | POA: Diagnosis not present

## 2021-03-16 DIAGNOSIS — E872 Acidosis: Secondary | ICD-10-CM | POA: Diagnosis not present

## 2021-03-16 DIAGNOSIS — N057 Unspecified nephritic syndrome with diffuse crescentic glomerulonephritis: Secondary | ICD-10-CM | POA: Diagnosis not present

## 2021-03-16 DIAGNOSIS — D631 Anemia in chronic kidney disease: Secondary | ICD-10-CM | POA: Diagnosis not present

## 2021-03-18 ENCOUNTER — Encounter (HOSPITAL_COMMUNITY)
Admission: RE | Admit: 2021-03-18 | Discharge: 2021-03-18 | Disposition: A | Discharge status: Home / Self Care | Payer: Medicare Other | Source: Ambulatory Visit | Attending: Nephrology | Admitting: Nephrology

## 2021-03-18 ENCOUNTER — Other Ambulatory Visit: Payer: Self-pay | Admitting: Oral Surgery

## 2021-03-18 ENCOUNTER — Other Ambulatory Visit: Payer: Self-pay

## 2021-03-18 VITALS — BP 141/70 | HR 66 | Resp 18

## 2021-03-18 DIAGNOSIS — N189 Chronic kidney disease, unspecified: Secondary | ICD-10-CM

## 2021-03-18 DIAGNOSIS — K1321 Leukoplakia of oral mucosa, including tongue: Secondary | ICD-10-CM | POA: Diagnosis not present

## 2021-03-18 DIAGNOSIS — D631 Anemia in chronic kidney disease: Secondary | ICD-10-CM

## 2021-03-18 LAB — POCT HEMOGLOBIN-HEMACUE: Hemoglobin: 10.9 g/dL — ABNORMAL LOW (ref 12.0–15.0)

## 2021-03-18 MED ORDER — EPOETIN ALFA-EPBX 10000 UNIT/ML IJ SOLN
INTRAMUSCULAR | Status: AC
  Filled 2021-03-18: qty 1

## 2021-03-18 MED ORDER — EPOETIN ALFA-EPBX 10000 UNIT/ML IJ SOLN
10000.0000 [IU] | INTRAMUSCULAR | Status: DC
  Administered 2021-03-18: 10000 [IU] via SUBCUTANEOUS

## 2021-03-30 DIAGNOSIS — Z1151 Encounter for screening for human papillomavirus (HPV): Secondary | ICD-10-CM | POA: Diagnosis not present

## 2021-03-30 DIAGNOSIS — Z779 Other contact with and (suspected) exposures hazardous to health: Secondary | ICD-10-CM | POA: Diagnosis not present

## 2021-03-30 DIAGNOSIS — Z01411 Encounter for gynecological examination (general) (routine) with abnormal findings: Secondary | ICD-10-CM | POA: Diagnosis not present

## 2021-03-30 DIAGNOSIS — Z6841 Body Mass Index (BMI) 40.0 and over, adult: Secondary | ICD-10-CM | POA: Diagnosis not present

## 2021-03-30 DIAGNOSIS — R87612 Low grade squamous intraepithelial lesion on cytologic smear of cervix (LGSIL): Secondary | ICD-10-CM | POA: Diagnosis not present

## 2021-03-30 DIAGNOSIS — Z124 Encounter for screening for malignant neoplasm of cervix: Secondary | ICD-10-CM | POA: Diagnosis not present

## 2021-03-30 DIAGNOSIS — Z01419 Encounter for gynecological examination (general) (routine) without abnormal findings: Secondary | ICD-10-CM | POA: Diagnosis not present

## 2021-03-30 DIAGNOSIS — Z1231 Encounter for screening mammogram for malignant neoplasm of breast: Secondary | ICD-10-CM | POA: Diagnosis not present

## 2021-04-01 ENCOUNTER — Encounter (HOSPITAL_COMMUNITY)
Admission: RE | Admit: 2021-04-01 | Discharge: 2021-04-01 | Disposition: A | Discharge status: Home / Self Care | Payer: Medicare Other | Source: Ambulatory Visit | Attending: Nephrology | Admitting: Nephrology

## 2021-04-01 VITALS — BP 136/64 | HR 73 | Temp 97.5°F | Resp 20

## 2021-04-01 DIAGNOSIS — N189 Chronic kidney disease, unspecified: Secondary | ICD-10-CM | POA: Diagnosis not present

## 2021-04-01 DIAGNOSIS — D631 Anemia in chronic kidney disease: Secondary | ICD-10-CM | POA: Diagnosis not present

## 2021-04-01 LAB — POCT HEMOGLOBIN-HEMACUE: Hemoglobin: 10.5 g/dL — ABNORMAL LOW (ref 12.0–15.0)

## 2021-04-01 LAB — FERRITIN: Ferritin: 689 ng/mL — ABNORMAL HIGH (ref 11–307)

## 2021-04-01 LAB — IRON AND TIBC
Iron: 84 ug/dL (ref 28–170)
Saturation Ratios: 26 % (ref 10.4–31.8)
TIBC: 318 ug/dL (ref 250–450)
UIBC: 234 ug/dL

## 2021-04-01 MED ORDER — EPOETIN ALFA-EPBX 10000 UNIT/ML IJ SOLN
INTRAMUSCULAR | Status: AC
  Administered 2021-04-01: 10000 [IU] via SUBCUTANEOUS
  Filled 2021-04-01: qty 1

## 2021-04-01 MED ORDER — EPOETIN ALFA-EPBX 10000 UNIT/ML IJ SOLN: 10000.0000 [IU] | INTRAMUSCULAR | Status: DC

## 2021-04-06 ENCOUNTER — Other Ambulatory Visit: Payer: Self-pay

## 2021-04-06 ENCOUNTER — Encounter (INDEPENDENT_AMBULATORY_CARE_PROVIDER_SITE_OTHER): Payer: Self-pay | Admitting: Ophthalmology

## 2021-04-06 ENCOUNTER — Ambulatory Visit (INDEPENDENT_AMBULATORY_CARE_PROVIDER_SITE_OTHER): Payer: Medicare Other | Admitting: Ophthalmology

## 2021-04-06 DIAGNOSIS — H43822 Vitreomacular adhesion, left eye: Secondary | ICD-10-CM

## 2021-04-06 DIAGNOSIS — H353221 Exudative age-related macular degeneration, left eye, with active choroidal neovascularization: Secondary | ICD-10-CM | POA: Diagnosis not present

## 2021-04-06 DIAGNOSIS — H43821 Vitreomacular adhesion, right eye: Secondary | ICD-10-CM | POA: Diagnosis not present

## 2021-04-06 DIAGNOSIS — N184 Chronic kidney disease, stage 4 (severe): Secondary | ICD-10-CM | POA: Diagnosis not present

## 2021-04-06 MED ORDER — BEVACIZUMAB 2.5 MG/0.1ML IZ SOSY
2.5000 mg | PREFILLED_SYRINGE | INTRAVITREAL | Status: AC | PRN
  Administered 2021-04-06: 2.5 mg via INTRAVITREAL

## 2021-04-06 NOTE — Assessment & Plan Note (Signed)
Serous retinal detachment improving on intravitreal Avastin, thus VMT not likely the major cause of outer retinal foveal elevation

## 2021-04-06 NOTE — Assessment & Plan Note (Signed)
With secondary inner foveal schisis, may have progressed slightly to outer retinal disruption yet with good acuity will continue to observe

## 2021-04-06 NOTE — Assessment & Plan Note (Signed)
Symptomatic improved acuity on intravitreal Avastin we will repeat the injection today

## 2021-04-06 NOTE — Progress Notes (Signed)
04/06/2021     CHIEF COMPLAINT Patient presents for Retina Follow Up (6 Wk F/U OS, poss Avastin OS//Pt denies noticeable changes to [place] OU since last visit. Pt denies ocular pain, flashes of light, or floaters OU. //)   HISTORY OF PRESENT ILLNESS: Cheryl [name] is a 75 y.o. female who presents to the clinic today for:   HPI    Retina Follow Up    Patient presents with  Wet AMD.  In left eye.  This started 6 weeks ago.  Severity is mild.  Duration of 6 weeks.  Since onset it is stable. Additional comments: 6 Wk F/U OS, poss Avastin OS  Pt denies noticeable changes to [place] OU since last visit. Pt denies ocular pain, flashes of light, or floaters OU.          Last edited by Rockie Neighbours, [place] on 04/06/2021  2:08 PM. (History)      Referring physician: Jake Samples, PA-C [place] [id number]  HISTORICAL INFORMATION:   Selected notes from the [place]: No current outpatient medications on file. (Ophthalmic Drugs)   No current facility-administered medications for this visit. (Ophthalmic Drugs)   Current Outpatient Medications (Other)  Medication Sig  . acetaminophen (TYLENOL) 500 MG tablet Take 500-1,000 mg by mouth as needed (for pain/headache).   Marland Kitchen albuterol (VENTOLIN HFA) 108 (90 Base) MCG/ACT inhaler Inhale 2 puffs into the lungs every 6 (six) hours as needed for wheezing or shortness of breath.  Marland Kitchen buPROPion (WELLBUTRIN XL) 300 MG 24 hr tablet Take 300 mg by mouth daily.  . Cholecalciferol (VITAMIN D3) 125 MCG (5000 UT) CAPS Take 1 capsule by mouth daily.   . Cyanocobalamin (VITAMIN B-12) 5000 MCG TBDP Take 1,000 mcg by mouth daily.   . cyclophosphamide (CYTOXAN) 25 MG capsule Take 25 mg by mouth daily.   . cyclophosphamide (CYTOXAN) 25 MG capsule TAKE 3 CAPSULES BY MOUTH DAILY  . cyclophosphamide (CYTOXAN) 25 MG capsule TAKE 3 (THREE) CAPSULES BY MOUTH DAILY  . diclofenac sodium (VOLTAREN) 1 % GEL  Apply 2-4 g topically 4 (four) times daily. (Patient taking differently: Apply 2-4 g topically as needed. )  . fluticasone (FLONASE) 50 MCG/ACT nasal spray Place 2 sprays into both nostrils daily as needed for allergies or rhinitis.  . furosemide (LASIX) 80 MG tablet as needed.   Marland Kitchen guaiFENesin (MUCINEX) 600 MG 12 hr tablet Take 1 tablet (600 mg total) by mouth 2 (two) times daily. (Patient taking differently: Take 600 mg by mouth 2 (two) times daily as needed. )  . LINZESS 290 MCG CAPS capsule TAKE 1 CAPSULE BY MOUTH DAILY BEFORE BREAKFAST.  Marland Kitchen losartan (COZAAR) 25 MG tablet Take 12.5 mg by mouth at bedtime.  . metoprolol succinate (TOPROL-XL) 50 MG 24 hr tablet Take 50 mg by mouth daily. Take with or immediately following a meal.  . montelukast (SINGULAIR) 10 MG tablet Take 1 tablet (10 mg total) by mouth every morning.  . nystatin (MYCOSTATIN) 100000 UNIT/ML suspension Take 5 mLs by mouth 2 (two) times daily as needed (for thrush).   . ondansetron (ZOFRAN) 4 MG tablet Take 4 mg by mouth as needed for nausea or vomiting.   . pantoprazole (PROTONIX) 40 MG tablet Take one tablet once or twice daily before a meal for reflux  . polyethylene glycol (MIRALAX / GLYCOLAX) packet Take 17 g by mouth daily as needed for mild constipation or moderate  constipation.   . predniSONE (DELTASONE) 20 MG tablet Take 15 mg by mouth daily.   . raloxifene (EVISTA) 60 MG tablet Take 60 mg by mouth daily.  Marland Kitchen rOPINIRole (REQUIP) 0.25 MG tablet Take 0.75 mg by mouth daily as needed (for rls).   . rosuvastatin (CRESTOR) 10 MG tablet Take 10 mg by mouth at bedtime.  . tolterodine (DETROL) 1 MG tablet Take 1 mg by mouth as needed.   . Vitamin D, Ergocalciferol, (DRISDOL) 1.25 MG (50000 UNIT) CAPS capsule Take 50,000 Units by mouth every 7 (seven) days.   No current facility-administered medications for this visit. (Other)      REVIEW OF SYSTEMS:    ALLERGIES Allergies  Allergen Reactions  . Prednisone Other (See  Comments)    Hallucinations   . Tramadol Nausea Only  . Aspirin Nausea And Vomiting  . Celecoxib Rash    PAST MEDICAL HISTORY Past Medical History:  Diagnosis Date  . Acid reflux   . Arthritis   . CKD (chronic kidney disease)   . Constipation   . GERD (gastroesophageal reflux disease)   . Neuromuscular disorder ([place])   . PUD (peptic ulcer disease) 2017   gastric ulcer healed on repeat EGD in August 2017  . Sinus drainage   . Sleep apnea    had surgery to correct   Past Surgical History:  Procedure Laterality Date  . ANTERIOR CERVICAL DECOMP/DISCECTOMY FUSION N/A 11/19/2016   Procedure: ANTERIOR CERVICAL DISCECTOMY AND FUSION C5-6 WITH PLATES, SCREWS, CAGE, VIVIGEN II;  Surgeon: Jessy Oto, MD;  Location: [facility];  Service: Orthopedics;  Laterality: N/A;  . CARPAL TUNNEL RELEASE    . CATARACT EXTRACTION, BILATERAL Bilateral 2017   One in December 2017 and one in January 2018  . COLONOSCOPY  02/15/2005   LEX:NTZGY small polyps ablated via cold biopsy, one from transverse colon and two from the rectum/Small external hemorrhoids  . COLONOSCOPY  07/10/2010   FVC:BSWHQP rectum/long redundant colon, polyps in the sigmoid, descending, hepatic flexure/ADENOMATOUS POLYPS. next TCS due 06/2015  . COLONOSCOPY  1995   3 polyps, path revealed chronic colitis  . COLONOSCOPY N/A 08/05/2015   Procedure: COLONOSCOPY;  Surgeon: Daneil Dolin, MD; melanosis coli, colonic diverticulosis. Repeat colonoscopy in 5 years for surveillance.  . COLONOSCOPY N/A 09/03/2020   Procedure: COLONOSCOPY;  Surgeon: Daneil Dolin, MD;  [facility];  Service: Endoscopy;  Laterality: N/A;  11:15am  . ESOPHAGOGASTRODUODENOSCOPY  1995   Gastritis  . ESOPHAGOGASTRODUODENOSCOPY N/A 04/16/2013   RMR: HH  . ESOPHAGOGASTRODUODENOSCOPY N/A 05/13/2016   Procedure: ESOPHAGOGASTRODUODENOSCOPY (EGD);  Surgeon: Daneil Dolin, MD; gastric ulcer and erosions s/p biopsy, erosive gastropathy. Path of reactive and  regenerative changes, no H. pylori.  . ESOPHAGOGASTRODUODENOSCOPY N/A 08/18/2016   Procedure: ESOPHAGOGASTRODUODENOSCOPY (EGD);  Surgeon: Daneil Dolin, MD; normal esophagus, previous gastric ulcer completely healed, small hiatal hernia, normal duodenum.  Marland Kitchen KNEE SURGERY     x2  . POLYPECTOMY  09/03/2020   Procedure: POLYPECTOMY;  Surgeon: Daneil Dolin, MD;  [facility];  Service: Endoscopy;;  . ROTATOR CUFF REPAIR     x2  . SHOULDER ARTHROSCOPY WITH ROTATOR CUFF REPAIR AND SUBACROMIAL DECOMPRESSION Right 01/21/2015   Procedure: RIGHT SHOULDER ARTHROSCOPIC DEBRIDEMNT OF G-H JOINT AND REMOVAL OF LOOSE BODIES,ARTHROSCOPIC SUBACROMIAL DECOMPRESSION,MINI OPEN RCT REPAIR WITH SUPPLEMENTAL [facility] PATCH;  Surgeon: Garald Balding, MD;  Location: [facility];  Service: Orthopedics;  Laterality: Right;  . TONSILLECTOMY    . TRIGGER FINGER  RELEASE    . UVULOPALATOPHARYNGOPLASTY      FAMILY HISTORY Family History  Problem Relation Age of Onset  . Cancer Mother   . Cancer Father   . Diabetes Sister   . Colon cancer Neg Hx   . Liver disease Neg Hx     SOCIAL HISTORY Social History   Tobacco Use  . Smoking status: Former Smoker    Packs/day: 0.10    Years: 48.00    Pack years: 4.80    Types: Cigarettes    Quit date: 05/11/2005    Years since quitting: 15.9  . Smokeless tobacco: Never Used  Substance Use Topics  . Alcohol use: No    Alcohol/week: 0.0 standard drinks  . Drug use: No         OPHTHALMIC EXAM: Base Eye Exam    Visual Acuity (ETDRS)      Right Left   Dist cc 20/30 +2 20/50 +1   Dist ph cc 20/25 -1 20/50 +2   Correction: Glasses       Tonometry (Tonopen, 2:12 PM)      Right Left   Pressure 13 13       Pupils      Pupils Dark Light Shape React APD   Right PERRL 4 3 Round Brisk None   Left PERRL 4 3 Round Brisk None       Visual Fields (Counting fingers)      Left Right    Full Full       Extraocular Movement      Right Left    Full Full        Neuro/Psych    Oriented x3: Yes   Mood/Affect: Normal       Dilation    Left eye: 1.0% Mydriacyl, 2.5% Phenylephrine @ 2:12 PM        Slit Lamp and Fundus Exam    External Exam      Right Left   External Normal Normal       Slit Lamp Exam      Right Left   Lids/Lashes Normal Normal   Conjunctiva/Sclera White and quiet White and quiet   Cornea Clear Clear   Anterior Chamber Deep and quiet Deep and quiet   Iris Round and reactive Round and reactive   Lens Posterior chamber intraocular lens Posterior chamber intraocular lens   Anterior Vitreous Normal Normal       Fundus Exam      Right Left   Posterior Vitreous  Normal   Disc  Normal   C/D Ratio  0.5   Macula  Macular thickening, Retinal pigment epithelial detachment,, subretinal fluid inferior to the fovea.  Subretinal white fibrotic area corresponds with the RPE detachment noted on OCT temporal to the fovea.   Vessels  Normal   Periphery  Normal          IMAGING AND PROCEDURES  Imaging and Procedures for 04/06/21  OCT, Retina - OU - Both Eyes       Right Eye Quality was good. Central Foveal Thickness: 299. Progression has been stable. Findings include vitreomacular adhesion , vitreous traction.   Left Eye Quality was good. Scan locations included subfoveal. Central Foveal Thickness: 248. Progression has been stable. Findings include vitreous traction, vitreomacular adhesion , subretinal fluid, pigment epithelial detachment.   Notes Vitreomacular traction syndrome right eye, with inner foveal schisis and now with outer retinal Dehiscence of the photoreceptor layer.  Will need to observe and monitor this  closely if this progresses may need simple vitrectomy to release this traction  OS broad based vitreomacular traction may be contributing to ongoing yet now improving serous retinal detachment associated with pigment epithelial detachment.  OS much improved post recent Avastin and much smaller onset yet  with vitreal macular traction and adhesion remaining       Intravitreal Injection, Pharmacologic Agent - OS - Left Eye       Time Out 04/06/2021. 2:46 PM. Confirmed correct patient, procedure, site, and patient consented.   Anesthesia Topical anesthesia was used.   Procedure Preparation included Ofloxacin . A 30 gauge needle was used.   Injection:  2.5 mg Bevacizumab (AVASTIN) 2.5mg /0.18mL SOSY   NDC: 16384-665-99, Lot: [id number]   Route: Intravitreal, Site: Left Eye  Post-op Post injection exam found visual acuity of at least counting fingers. The patient tolerated the procedure well. There were no complications. The patient received written and verbal post procedure care education. Post injection medications were not given.                 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 Exudative age-related macular degeneration of left eye with active choroidal neovascularization (HCC)  H35.3221 OCT, Retina - OU - Both Eyes    Intravitreal Injection, Pharmacologic Agent - OS - Left Eye    bevacizumab (AVASTIN) SOSY 2.5 mg  2. Vitreomacular adhesion of right eye  H43.821   3. Vitreomacular traction syndrome, left  H43.822     1.  OS with improving serous retinal detachment from CNVM.  We will repeat injection today at 6-week interval and follow-up again in 6 weeks.  2.  Broad-based VMT OS, still might be some component of central foveal elevation  we will continue to monitor  3.  Intraretinal VMT OD with secondary schisis now with outer retinal changes may be trying to progress to full-thickness hole.  These findings were discussed and demonstrated to the patient we will continue to monitor.  If outer retinal changes develop may need simple vitrectomy to prevent further loss of visual acuity impact  Ophthalmic Meds Ordered this visit:  Meds ordered this encounter  Medications  . bevacizumab (AVASTIN) SOSY 2.5 mg       Return in about 6 weeks (around 05/18/2021) for dilate, OS, AVASTIN OCT.  There are no Patient Instructions on file for this visit.   Explained the diagnoses, plan, and follow up with the patient and they expressed understanding.  Patient expressed understanding of the importance of proper follow up care.   Clent Demark Zyriah Mask M.D. Diseases & Surgery of the Retina and Vitreous Retina & Diabetic [place] 04/06/21     Abbreviations: M myopia (nearsighted); A astigmatism; H hyperopia (farsighted); P presbyopia; Mrx spectacle prescription;  CTL contact lenses; OD right eye; OS left eye; OU both eyes  XT exotropia; ET esotropia; PEK punctate epithelial keratitis; PEE punctate epithelial erosions; DES dry eye syndrome; MGD meibomian gland dysfunction; ATs artificial tears; PFAT's preservative free artificial tears; [place] nuclear sclerotic cataract; PSC posterior subcapsular  cataract; ERM epi-retinal membrane; PVD posterior vitreous detachment; RD retinal detachment; DM diabetes mellitus; DR diabetic retinopathy; NPDR non-proliferative diabetic retinopathy; PDR proliferative diabetic retinopathy; CSME clinically significant macular edema; DME diabetic macular edema; dbh dot blot hemorrhages; CWS cotton wool spot; POAG primary open angle glaucoma; C/D cup-to-disc ratio; HVF humphrey visual field; GVF goldmann visual field; OCT optical coherence tomography; IOP intraocular pressure; BRVO Branch retinal vein occlusion; CRVO central  retinal vein occlusion; CRAO central retinal artery occlusion; BRAO branch retinal artery occlusion; RT retinal tear; SB scleral buckle; PPV pars plana vitrectomy; VH Vitreous hemorrhage; PRP panretinal laser photocoagulation; IVK intravitreal kenalog; VMT vitreomacular traction; MH Macular hole;  NVD neovascularization of the disc; NVE neovascularization elsewhere; AREDS age related eye disease study; ARMD age related macular degeneration; POAG primary open angle glaucoma; EBMD epithelial/anterior basement membrane dystrophy; ACIOL anterior chamber intraocular lens; IOL intraocular lens; PCIOL posterior chamber intraocular lens; Phaco/IOL phacoemulsification with intraocular lens placement; [place] photorefractive keratectomy; LASIK laser assisted in situ keratomileusis; HTN hypertension; DM diabetes mellitus; COPD chronic obstructive pulmonary disease

## 2021-04-13 DIAGNOSIS — E785 Hyperlipidemia, unspecified: Secondary | ICD-10-CM | POA: Diagnosis not present

## 2021-04-13 DIAGNOSIS — D84821 Immunodeficiency due to drugs: Secondary | ICD-10-CM | POA: Diagnosis not present

## 2021-04-13 DIAGNOSIS — N184 Chronic kidney disease, stage 4 (severe): Secondary | ICD-10-CM | POA: Diagnosis not present

## 2021-04-13 DIAGNOSIS — R768 Other specified abnormal immunological findings in serum: Secondary | ICD-10-CM | POA: Diagnosis not present

## 2021-04-13 DIAGNOSIS — N057 Unspecified nephritic syndrome with diffuse crescentic glomerulonephritis: Secondary | ICD-10-CM | POA: Diagnosis not present

## 2021-04-13 DIAGNOSIS — N2581 Secondary hyperparathyroidism of renal origin: Secondary | ICD-10-CM | POA: Diagnosis not present

## 2021-04-13 DIAGNOSIS — K219 Gastro-esophageal reflux disease without esophagitis: Secondary | ICD-10-CM | POA: Diagnosis not present

## 2021-04-13 DIAGNOSIS — I129 Hypertensive chronic kidney disease with stage 1 through stage 4 chronic kidney disease, or unspecified chronic kidney disease: Secondary | ICD-10-CM | POA: Diagnosis not present

## 2021-04-13 DIAGNOSIS — N058 Unspecified nephritic syndrome with other morphologic changes: Secondary | ICD-10-CM | POA: Diagnosis not present

## 2021-04-13 DIAGNOSIS — Z79899 Other long term (current) drug therapy: Secondary | ICD-10-CM | POA: Diagnosis not present

## 2021-04-13 DIAGNOSIS — D631 Anemia in chronic kidney disease: Secondary | ICD-10-CM | POA: Diagnosis not present

## 2021-04-13 DIAGNOSIS — H538 Other visual disturbances: Secondary | ICD-10-CM | POA: Diagnosis not present

## 2021-04-15 ENCOUNTER — Encounter (HOSPITAL_COMMUNITY): Payer: Medicare Other

## 2021-04-18 ENCOUNTER — Other Ambulatory Visit (HOSPITAL_COMMUNITY): Payer: Self-pay

## 2021-04-18 MED FILL — Cyclophosphamide Cap 25 MG: ORAL | 30 days supply | Qty: 90 | Fill #0 | Status: AC

## 2021-04-21 ENCOUNTER — Other Ambulatory Visit (HOSPITAL_COMMUNITY): Payer: Self-pay

## 2021-04-23 ENCOUNTER — Other Ambulatory Visit: Payer: Self-pay

## 2021-04-23 ENCOUNTER — Encounter (HOSPITAL_COMMUNITY)
Admission: RE | Admit: 2021-04-23 | Discharge: 2021-04-23 | Disposition: A | Discharge status: Home / Self Care | Payer: Medicare Other | Source: Ambulatory Visit | Attending: Nephrology | Admitting: Nephrology

## 2021-04-23 VITALS — BP 125/80 | HR 80 | Temp 97.4°F | Resp 20

## 2021-04-23 DIAGNOSIS — N189 Chronic kidney disease, unspecified: Secondary | ICD-10-CM | POA: Insufficient documentation

## 2021-04-23 DIAGNOSIS — D631 Anemia in chronic kidney disease: Secondary | ICD-10-CM | POA: Diagnosis not present

## 2021-04-23 LAB — POCT HEMOGLOBIN-HEMACUE: Hemoglobin: 11.3 g/dL — ABNORMAL LOW (ref 12.0–15.0)

## 2021-04-23 MED ORDER — EPOETIN ALFA-EPBX 10000 UNIT/ML IJ SOLN
10000.0000 [IU] | INTRAMUSCULAR | Status: DC
  Administered 2021-04-23: 10000 [IU] via SUBCUTANEOUS

## 2021-04-23 MED ORDER — EPOETIN ALFA-EPBX 10000 UNIT/ML IJ SOLN
INTRAMUSCULAR | Status: AC
  Filled 2021-04-23: qty 1

## 2021-04-27 DIAGNOSIS — N184 Chronic kidney disease, stage 4 (severe): Secondary | ICD-10-CM | POA: Diagnosis not present

## 2021-05-04 ENCOUNTER — Encounter (INDEPENDENT_AMBULATORY_CARE_PROVIDER_SITE_OTHER): Payer: Self-pay

## 2021-05-04 DIAGNOSIS — K219 Gastro-esophageal reflux disease without esophagitis: Secondary | ICD-10-CM | POA: Diagnosis not present

## 2021-05-04 DIAGNOSIS — Z79899 Other long term (current) drug therapy: Secondary | ICD-10-CM | POA: Diagnosis not present

## 2021-05-04 DIAGNOSIS — R942 Abnormal results of pulmonary function studies: Secondary | ICD-10-CM | POA: Diagnosis not present

## 2021-05-04 DIAGNOSIS — D84821 Immunodeficiency due to drugs: Secondary | ICD-10-CM | POA: Diagnosis not present

## 2021-05-04 DIAGNOSIS — N057 Unspecified nephritic syndrome with diffuse crescentic glomerulonephritis: Secondary | ICD-10-CM | POA: Diagnosis not present

## 2021-05-04 DIAGNOSIS — N185 Chronic kidney disease, stage 5: Secondary | ICD-10-CM | POA: Diagnosis not present

## 2021-05-04 DIAGNOSIS — D631 Anemia in chronic kidney disease: Secondary | ICD-10-CM | POA: Diagnosis not present

## 2021-05-04 DIAGNOSIS — N058 Unspecified nephritic syndrome with other morphologic changes: Secondary | ICD-10-CM | POA: Diagnosis not present

## 2021-05-04 DIAGNOSIS — N2581 Secondary hyperparathyroidism of renal origin: Secondary | ICD-10-CM | POA: Diagnosis not present

## 2021-05-04 DIAGNOSIS — I120 Hypertensive chronic kidney disease with stage 5 chronic kidney disease or end stage renal disease: Secondary | ICD-10-CM | POA: Diagnosis not present

## 2021-05-04 DIAGNOSIS — R768 Other specified abnormal immunological findings in serum: Secondary | ICD-10-CM | POA: Diagnosis not present

## 2021-05-04 DIAGNOSIS — E785 Hyperlipidemia, unspecified: Secondary | ICD-10-CM | POA: Diagnosis not present

## 2021-05-04 IMAGING — DX DG CHEST 1V PORT
1 series · 1 of 1 positions shown · non-contrast
Comparison: Radiograph 11/19/2016

CLINICAL DATA: Chest pain. Congestion.

EXAM:
PORTABLE CHEST 1 VIEW

[chest ap]
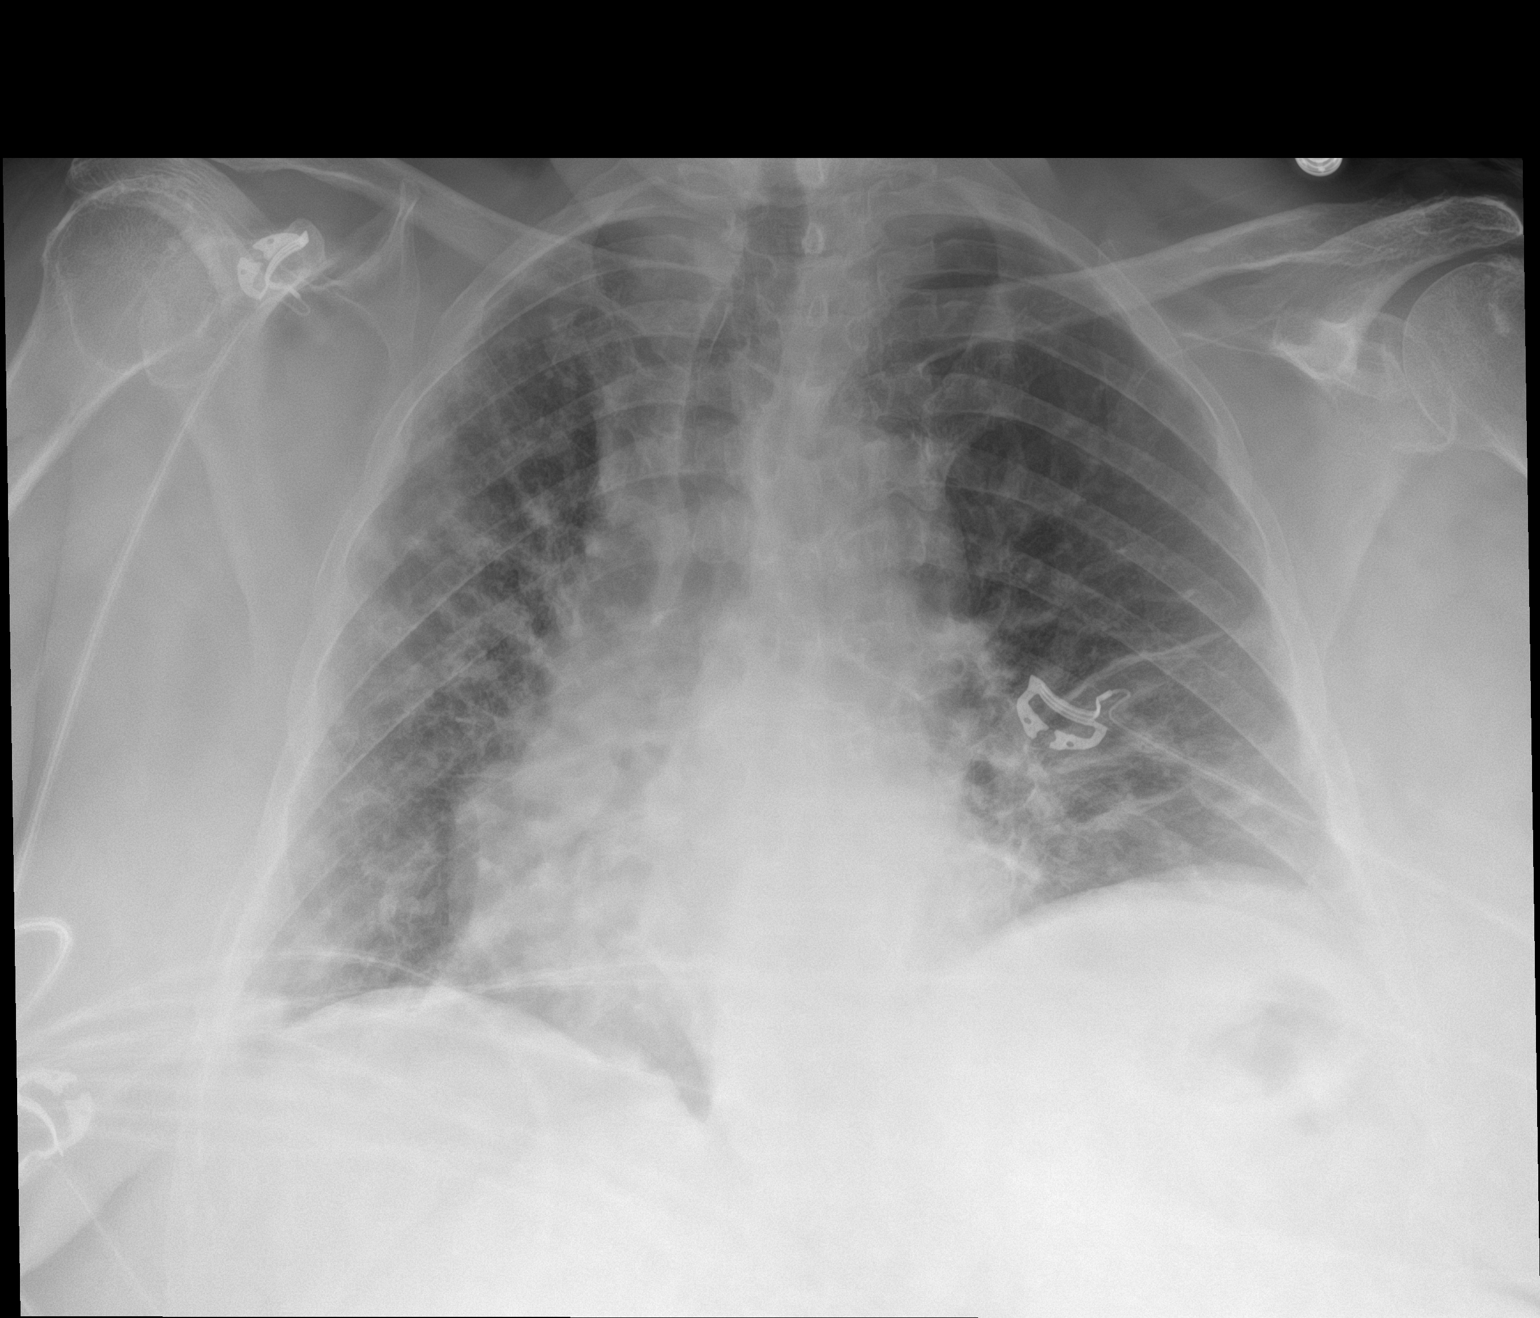

[1 of 1 positions shown; findings below may reference images not displayed]

FINDINGS: Borderline cardiomegaly. Unchanged mediastinal contours. Patchy and
heterogeneous bilateral lung opacities, right greater than left.
Superimposed streaky atelectasis or scarring at the left lung base
is unchanged from prior. Peribronchial and mild interstitial
thickening. No large pleural effusion. No evidence of pneumothorax.
Surgical hardware in the lower cervical spine is partially included.
IMPRESSION: Patchy and heterogeneous bilateral lung opacities, right greater
than left. This may represent pulmonary edema or multifocal
infection, including atypical viral organisms ([id number]).

## 2021-05-07 ENCOUNTER — Other Ambulatory Visit: Payer: Self-pay

## 2021-05-07 ENCOUNTER — Encounter (HOSPITAL_COMMUNITY)
Admission: RE | Admit: 2021-05-07 | Discharge: 2021-05-07 | Disposition: A | Discharge status: Home / Self Care | Payer: Medicare Other | Source: Ambulatory Visit | Attending: Nephrology | Admitting: Nephrology

## 2021-05-07 VITALS — HR 71

## 2021-05-07 DIAGNOSIS — D631 Anemia in chronic kidney disease: Secondary | ICD-10-CM | POA: Diagnosis not present

## 2021-05-07 DIAGNOSIS — N189 Chronic kidney disease, unspecified: Secondary | ICD-10-CM | POA: Diagnosis not present

## 2021-05-07 LAB — IRON AND TIBC
Iron: 71 ug/dL (ref 28–170)
Saturation Ratios: 23 % (ref 10.4–31.8)
TIBC: 307 ug/dL (ref 250–450)
UIBC: 236 ug/dL

## 2021-05-07 LAB — POCT HEMOGLOBIN-HEMACUE: Hemoglobin: 10.8 g/dL — ABNORMAL LOW (ref 12.0–15.0)

## 2021-05-07 LAB — FERRITIN: Ferritin: 634 ng/mL — ABNORMAL HIGH (ref 11–307)

## 2021-05-07 MED ORDER — EPOETIN ALFA-EPBX 10000 UNIT/ML IJ SOLN
INTRAMUSCULAR | Status: AC
  Filled 2021-05-07: qty 1

## 2021-05-07 MED ORDER — EPOETIN ALFA-EPBX 10000 UNIT/ML IJ SOLN
10000.0000 [IU] | INTRAMUSCULAR | Status: DC
  Administered 2021-05-07: 10000 [IU] via SUBCUTANEOUS

## 2021-05-11 ENCOUNTER — Ambulatory Visit (INDEPENDENT_AMBULATORY_CARE_PROVIDER_SITE_OTHER): Payer: Medicare Other | Admitting: Pulmonary Disease

## 2021-05-11 ENCOUNTER — Other Ambulatory Visit: Payer: Self-pay

## 2021-05-11 ENCOUNTER — Encounter: Payer: Self-pay | Admitting: Pulmonary Disease

## 2021-05-11 VITALS — BP 124/78 | HR 76 | Temp 97.2°F | Ht 59.0 in | Wt 201.0 lb

## 2021-05-11 DIAGNOSIS — R942 Abnormal results of pulmonary function studies: Secondary | ICD-10-CM | POA: Diagnosis not present

## 2021-05-11 DIAGNOSIS — J984 Other disorders of lung: Secondary | ICD-10-CM

## 2021-05-11 DIAGNOSIS — G4719 Other hypersomnia: Secondary | ICD-10-CM | POA: Diagnosis not present

## 2021-05-11 NOTE — Progress Notes (Signed)
Subjective:   PATIENT ID: Cheryl [name] GENDER: female DOB: June 05, 1946, MRN: [id number]   HPI  Chief Complaint  Patient presents with  . Consult    Recently had an abnormal breathing test but needs clarification or possibly retest due to plan to have kidney transplant. Reports intermittent shortness of breath with walking and heat. Has a history of sleep apnea, not currently on CPAP.    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 Referred for pulmonary evaluation.  Reviewed faxed records from [place] on 05/04/21. She was seen for CKD. Per [facility] she was not an appropriate candidate for kidney transplant due to abnormal PFTs. Has reportedly has history of sleep apnea.    She reports shortness of with moderate and severe exertion. Denies wheezing or coughing. She had a history of sleep apnea s/p tonsillectomy > 10 years ago. She still has snoring but no comments about witnessed apneic episodes. Since her CKD diagnosis she reports decreased energy and fatigue. Denies sleep issues however has drowsiness during the daytime with excessive sleepiness. Denies waking up gasping or short of breath. But she does need to get up for nocturia. She does not have a regular schedule since being retired and often watches TV late into the night.   Social History: Smoked 1/4 ppd since she was 44. Quit in 2006.  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 >30 years  I have personally reviewed patient's past medical/family/social history, allergies, current medications.  Past Medical History:  Diagnosis Date  . Acid reflux   . Arthritis   . CKD (chronic kidney disease)   . Constipation   . GERD (gastroesophageal reflux disease)   . Neuromuscular disorder ([place])   . PUD (peptic  ulcer disease) 2017   gastric ulcer healed on repeat EGD in August 2017  . Sinus drainage   . Sleep apnea    had surgery to correct     Family History  Problem Relation Age of Onset  . Cancer Mother   . Cancer Father   . Diabetes Sister   . Colon cancer Neg Hx   . Liver disease Neg Hx      Social History   Occupational History  . Occupation: BUS DRIVER    Employer: [employer]  Tobacco Use  . Smoking status: Former Smoker    Packs/day: 0.10    Years: 48.00    Pack years: 4.80    Types: Cigarettes    Quit date: 05/11/2005    Years since quitting: 16.0  . Smokeless tobacco: Never Used  . Tobacco comment: smoked less than 1 pack a week  Vaping Use  . Vaping Use: Never used  Substance and Sexual Activity  . Alcohol use: No    Alcohol/week: 0.0 standard drinks  . Drug use: No  . Sexual activity: Not on file    Allergies  Allergen Reactions  . Prednisone Other (See Comments)    Hallucinations   . Tramadol Nausea Only  . Aspirin Nausea And Vomiting  . Celecoxib Rash     Outpatient Medications Prior to Visit  Medication Sig Dispense Refill  . acetaminophen (TYLENOL) 500 MG tablet Take 500-1,000 mg by mouth as needed (for pain/headache).     Marland Kitchen albuterol (VENTOLIN HFA) 108 (90 Base) MCG/ACT inhaler  Inhale 2 puffs into the lungs every 6 (six) hours as needed for wheezing or shortness of breath. 18 g 2  . buPROPion (WELLBUTRIN XL) 300 MG 24 hr tablet Take 300 mg by mouth daily.    . Cholecalciferol (VITAMIN D3) 125 MCG (5000 UT) CAPS Take 1 capsule by mouth daily.     . clobetasol ointment (TEMOVATE) 0.05 % SMARTSIG:1 Topical Every Night    . clotrimazole-betamethasone (LOTRISONE) cream Apply topically.    . colchicine 0.6 MG tablet Take 0.6 mg by mouth daily.    . Cyanocobalamin (VITAMIN B-12) 5000 MCG TBDP Take 1,000 mcg by mouth daily.     . cyclophosphamide (CYTOXAN) 25 MG capsule Take 25 mg by mouth daily.     . cyclophosphamide (CYTOXAN) 25 MG capsule TAKE  3 CAPSULES BY MOUTH DAILY 90 capsule 4  . cyclophosphamide (CYTOXAN) 25 MG capsule TAKE 3 (THREE) CAPSULES BY MOUTH DAILY 90 capsule 3  . diclofenac sodium (VOLTAREN) 1 % GEL Apply 2-4 g topically 4 (four) times daily. (Patient taking differently: Apply 2-4 g topically as needed.) 5 Tube 3  . Docusate Sodium (DSS) 100 MG CAPS Take by mouth.    . fluticasone (FLONASE) 50 MCG/ACT nasal spray Place 2 sprays into both nostrils daily as needed for allergies or rhinitis.    . furosemide (LASIX) 80 MG tablet as needed.     Marland Kitchen guaiFENesin (MUCINEX) 600 MG 12 hr tablet Take 1 tablet (600 mg total) by mouth 2 (two) times daily. (Patient taking differently: Take 600 mg by mouth 2 (two) times daily as needed.) 20 tablet 0  . LINZESS 290 MCG CAPS capsule TAKE 1 CAPSULE BY MOUTH DAILY BEFORE BREAKFAST. 90 capsule 3  . metoprolol succinate (TOPROL-XL) 50 MG 24 hr tablet Take 50 mg by mouth daily. Take with or immediately following a meal.    . montelukast (SINGULAIR) 10 MG tablet Take 1 tablet (10 mg total) by mouth every morning. 30 tablet 2  . nystatin (MYCOSTATIN) 100000 UNIT/ML suspension Take 5 mLs by mouth 2 (two) times daily as needed (for thrush).     . ondansetron (ZOFRAN) 4 MG tablet Take 4 mg by mouth as needed for nausea or vomiting.     . pantoprazole (PROTONIX) 40 MG tablet Take one tablet once or twice daily before a meal for reflux 60 tablet 3  . polyethylene glycol (MIRALAX / GLYCOLAX) packet Take 17 g by mouth daily as needed for mild constipation or moderate constipation.     . raloxifene (EVISTA) 60 MG tablet Take 60 mg by mouth daily.    Marland Kitchen rOPINIRole (REQUIP) 0.25 MG tablet Take 0.75 mg by mouth daily as needed (for rls).     . rosuvastatin (CRESTOR) 10 MG tablet Take 10 mg by mouth at bedtime.    Marland Kitchen amLODipine (NORVASC) 2.5 MG tablet Take 2.5 mg by mouth daily. (Patient not taking: Reported on 05/11/2021)    . losartan (COZAAR) 25 MG tablet Take 12.5 mg by mouth at bedtime. (Patient not taking:  Reported on 05/11/2021)    . predniSONE (DELTASONE) 20 MG tablet Take 15 mg by mouth daily.  (Patient not taking: Reported on 05/11/2021)    . tolterodine (DETROL) 1 MG tablet Take 1 mg by mouth as needed.  (Patient not taking: Reported on 05/11/2021)    . Vitamin D, Ergocalciferol, (DRISDOL) 1.25 MG (50000 UNIT) CAPS capsule Take 50,000 Units by mouth every 7 (seven) days. (Patient not taking: Reported on 05/11/2021)  No facility-administered medications prior to visit.    Review of Systems  Constitutional: Positive for malaise/fatigue. Negative for chills, diaphoresis, fever and weight loss.  HENT: Negative for congestion, ear pain and sore throat.   Respiratory: Positive for shortness of breath. Negative for cough, hemoptysis, sputum production and wheezing.   Cardiovascular: Negative for chest pain, palpitations and leg swelling.  Gastrointestinal: Negative for abdominal pain, heartburn and nausea.  Genitourinary: Positive for frequency.  Musculoskeletal: Negative for joint pain and myalgias.  Skin: Negative for itching and rash.  Neurological: Negative for dizziness, weakness and headaches.  Endo/Heme/Allergies: Does not bruise/bleed easily.  Psychiatric/Behavioral: Negative for depression. The patient is not nervous/anxious.      Objective:   Vitals:   05/11/21 1443  BP: 124/78  Pulse: 76  Temp: (!) 97.2 F (36.2 C)  TempSrc: Temporal  SpO2: 95%  Weight: 201 lb (91.2 kg)  Height: 4\' 11"  (1.499 m)   SpO2: 95 % (RA) O2 Device: None (Room air)  Physical Exam: General: Well-appearing, no acute distress HENT: [place], AT Eyes: EOMI, no scleral icterus Respiratory: Clear to auscultation bilaterally.  No crackles, wheezing or rales Cardiovascular: RRR, -M/R/G, no JVD Extremities:-Edema,-tenderness Neuro: AAO x4, CNII-XII grossly intact Skin: Intact, no rashes or bruising Psych: Normal mood, normal affect  Data Reviewed:  Imaging: CT A/P Lung fields 02/19/12 Bibasilar  atelectasis. Scattered small blebs CXR 04/19/20 - Stable bilateral pulmonary opacities, linear scarring in lingular  PFT: 05/11/21 FVC  1.49 (53%) FEV1 1.36 (63%) Ratio 91  TLC 60% DLCO 41% Interpretation: Moderate restrictive defect with moderately reduced diffusing capacity  Labs: CBC    Component Value Date/Time   WBC 14.5 (H) 08/27/2020 1115   RBC 3.61 (L) 08/27/2020 1115   HGB 9.9 (L) 05/22/2021 0912   HCT 39.4 08/27/2020 1115   HCT 37 03/23/2013 0000   PLT 374 08/27/2020 1115   MCV 109.1 (H) 08/27/2020 1115   MCV 91.1 03/23/2013 0000   MCH 33.0 08/27/2020 1115   MCHC 30.2 08/27/2020 1115   RDW 15.6 (H) 08/27/2020 1115   LYMPHSABS 0.6 (L) 08/27/2020 1115   MONOABS 1.6 (H) 08/27/2020 1115   EOSABS 0.0 08/27/2020 1115   BASOSABS 0.0 08/27/2020 1115   BMET    Component Value Date/Time   NA 143 08/27/2020 1115   NA 142 03/23/2013 0000   K 4.5 08/27/2020 1115   K 4.3 03/23/2013 0000   CL 110 08/27/2020 1115   CO2 23 08/27/2020 1115   GLUCOSE 60 (L) 08/27/2020 1115   BUN 62 (H) 08/27/2020 1115   BUN 17 03/23/2013 0000   CREATININE 3.55 (H) 08/27/2020 1115   CREATININE 0.98 03/23/2013 0000   CALCIUM 9.6 08/27/2020 1115   CALCIUM 9.7 08/27/2020 1115   GFRNONAA 12 (L) 08/27/2020 1115   GFRAA 14 (L) 08/27/2020 1115   Imaging, labs and test noted above have been reviewed independently by me.    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 PFTs with moderate restrictive defect and reduced DLCO. Differential includes ILD. No dedicated CT lung scan in EMR. No indication of supplemental oxygen on ambulatory O2 performed in office. Discussed need for further work-up with her Nephrologist, Dr. Royce Macadamia, who agreed to plan.  Moderate restrictive lung disease Reduced DLCO --ORDER CT Chest without contrast --Ambulatory O2 was normal  Excessive  daytime sleepiness --Discussed sleep hygiene --Offer home sleep study at next  visit  Health Maintenance Immunization History  Administered Date(s) Administered  . Influenza, High Dose Seasonal PF 09/19/2017, 10/02/2018, 10/16/2019  . Moderna Sars-Covid-2 Vaccination 02/01/2020, 10/13/2020, 12/29/2020  . Pneumococcal Conjugate-13 10/02/2017  . Zoster Recombinat (Shingrix) 10/02/2018, 01/11/2019   CT Lung Screen - not indicated  Orders Placed This Encounter  Procedures  . CT Chest Wo Contrast    Please schedule for first available MEDICARE PART A AND B/bcbs  Wt 201/No Needs/wear mask/come alone/No to all covid q's /No spinal cord/No body injector/no glucose mon//ab w/chantell@[facility] /epic order Please remember if you need to cancel your appt, please do so 24 hours prior to your appointment to avoid getting charged a no-show fee of $75.00 pt is aware    Standing Status:   Future    Standing Expiration Date:   05/11/2022    Order Specific Question:   Preferred imaging location?    Answer:   [id number]  No orders of the defined types were placed in this encounter.   No follow-ups on file.  I have spent a total time of 45-minutes on the day of the appointment reviewing prior documentation, coordinating care and discussing medical diagnosis and plan with the patient/family. Imaging, labs and tests included in this note have been reviewed and interpreted independently by me.  [place], MD [facility] 05/11/2021 2:58 PM  Office Number [phone]

## 2021-05-11 NOTE — Patient Instructions (Addendum)
Moderate restrictive lung disease Reduced DLCO --ORDER CT Chest without contrast --Ambulatory O2 was normal  Excessive daytime sleepiness --Discussed sleep hygiene  Follow-up with me in one month after CT Chest. May need labwork on this visit.  Hypersomnia Hypersomnia is a condition in which a person feels very tired during the day even though he or she gets plenty of sleep at night. A person with this condition may take naps during the day and may find it very difficult to wake up from sleep. Hypersomnia may affect a person's ability to think, concentrate, drive, or remember things. What are the causes? The cause of this condition may not be known. Possible causes include:  Certain medicines.  Sleep disorders, such as narcolepsy and sleep apnea.  Injury to the head, brain, or spinal cord.  Drug or alcohol use.  Gastroesophageal reflux disease (GERD).  Tumors.  Certain medical conditions, such as depression, diabetes, or an underactive thyroid gland (hypothyroidism). What are the signs or symptoms? The main symptoms of hypersomnia include:  Feeling very tired throughout the day, regardless of how much sleep you got the night before.  Having trouble waking up. Others may find it difficult to wake you up when you are sleeping.  Sleeping for longer and longer periods at a time.  Taking naps throughout the day. Other symptoms may include:  Feeling restless, anxious, or annoyed.  Lacking energy.  Having trouble with: ? Remembering. ? Speaking. ? Thinking.  Loss of appetite.  Seeing, hearing, tasting, smelling, or feeling things that are not real (hallucinations). How is this diagnosed? This condition may be diagnosed based on:  Your symptoms and medical history.  Your sleeping habits. Your health care provider may ask you to write down your sleeping habits in a daily sleep log, along with any symptoms you have.  A series of tests that are done while you sleep  (sleep study or polysomnogram).  A test that measures how quickly you can fall asleep during the day (daytime nap study or multiple sleep latency test). How is this treated? Treatment can help you manage your condition. Treatment may include:  Following a regular sleep routine.  Lifestyle changes, such as changing your eating habits, getting regular exercise, and avoiding alcohol or caffeinated beverages.  Taking medicines to make you more alert (stimulants) during the day.  Treating any underlying medical causes of hypersomnia. Follow these instructions at home: Sleep routine  Schedule the same bedtime and wake-up time each day.  Practice a relaxing bedtime routine. This may include reading, meditation, deep breathing, or taking a warm bath before going to sleep.  Get regular exercise each day. Avoid strenuous exercise in the evening hours.  Keep your sleep environment at a cooler temperature, darkened, and quiet.  Sleep with pillows and a mattress that are comfortable and supportive.  Schedule short 20-minute naps for when you feel sleepiest during the day.  Talk with your employer or teachers about your hypersomnia. If possible, adjust your schedule so that: ? You have a regular daytime work schedule. ? You can take a scheduled nap during the day. ? You do not have to work or be active at night.  Do not eat a heavy meal for a few hours before bedtime. Eat your meals at about the same times every day.  Avoid drinking alcohol or caffeinated beverages.   Safety  Do not drive or use heavy machinery if you are sleepy. Ask your health care provider if it is safe for you to  drive.  Wear a life jacket when swimming or spending time near water.   General instructions  Take supplements and over-the-counter and prescription medicines only as told by your health care provider.  Keep a sleep log that will help your doctor manage your condition. This may include information  about: ? What time you go to bed each night. ? How often you wake up at night. ? How many hours you sleep at night. ? How often and for how long you nap during the day. ? Any observations from others, such as leg movements during sleep, sleep walking, or snoring.  Keep all follow-up visits as told by your health care provider. This is important. Contact a health care provider if:  You have new symptoms.  Your symptoms get worse. Get help right away if:  You have serious thoughts about hurting yourself or someone else. If you ever feel like you may hurt yourself or others, or have thoughts about taking your own life, get help right away. You can go to your nearest emergency department or call:  Your local emergency services (911 in the [place]  A suicide crisis helpline, such as the [place] at 909 340 3357. This is open 24 hours a day. Summary  Hypersomnia refers to a condition in which you feel very tired during the day even though you get plenty of sleep at night.  A person with this condition may take naps during the day and may find it very difficult to wake up from sleep.  Hypersomnia may affect a person's ability to think, concentrate, drive, or remember things.  Treatment, such as following a regular sleep routine and making some lifestyle changes, can help you manage your condition. This information is not intended to replace advice given to you by your health care provider. Make sure you discuss any questions you have with your health care provider. Document Revised: 10/16/2020 Document Reviewed: 10/16/2020 Elsevier Patient Education  2021 [employer].

## 2021-05-19 ENCOUNTER — Encounter (INDEPENDENT_AMBULATORY_CARE_PROVIDER_SITE_OTHER): Payer: Self-pay | Admitting: Ophthalmology

## 2021-05-19 ENCOUNTER — Ambulatory Visit (INDEPENDENT_AMBULATORY_CARE_PROVIDER_SITE_OTHER): Payer: Medicare Other | Admitting: Ophthalmology

## 2021-05-19 ENCOUNTER — Other Ambulatory Visit: Payer: Self-pay

## 2021-05-19 DIAGNOSIS — N059 Unspecified nephritic syndrome with unspecified morphologic changes: Secondary | ICD-10-CM | POA: Diagnosis not present

## 2021-05-19 DIAGNOSIS — H43822 Vitreomacular adhesion, left eye: Secondary | ICD-10-CM | POA: Diagnosis not present

## 2021-05-19 DIAGNOSIS — Z6841 Body Mass Index (BMI) 40.0 and over, adult: Secondary | ICD-10-CM | POA: Diagnosis not present

## 2021-05-19 DIAGNOSIS — H35341 Macular cyst, hole, or pseudohole, right eye: Secondary | ICD-10-CM | POA: Insufficient documentation

## 2021-05-19 DIAGNOSIS — H43821 Vitreomacular adhesion, right eye: Secondary | ICD-10-CM | POA: Diagnosis not present

## 2021-05-19 DIAGNOSIS — H353221 Exudative age-related macular degeneration, left eye, with active choroidal neovascularization: Secondary | ICD-10-CM

## 2021-05-19 DIAGNOSIS — Z961 Presence of intraocular lens: Secondary | ICD-10-CM | POA: Insufficient documentation

## 2021-05-19 DIAGNOSIS — M2550 Pain in unspecified joint: Secondary | ICD-10-CM | POA: Diagnosis not present

## 2021-05-19 MED ORDER — PREDNISOLONE ACETATE 1 % OP SUSP
1.0000 [drp] | Freq: Four times a day (QID) | OPHTHALMIC | 0 refills | Status: DC
Start: 2021-05-19 — End: 2021-06-30

## 2021-05-19 MED ORDER — OFLOXACIN 0.3 % OP SOLN: 1.0000 [drp] | OPHTHALMIC | 0 refills | Status: AC

## 2021-05-19 NOTE — Patient Instructions (Signed)
1.  GIVE PREOPERATIVE EYE MEDICATION TO PATIENT TO TAKE HOME, AND DO NOT USE UNTIL POSTOPERATIVE VISIT THE NEXT DAY IN [facility] OFFICE UNLESS INSTRUCTED BY PHYSICIAN TO START THE NEXT DAY.   POSITIONS:  (if gas bubble placed into the eye) or planning purposes after surgery  A.  Do not sleep or rest on back  B.  Do not travel by airplane, to the mountains, or elevation above 2000 feet  C.    The patient understands the risks of anesthesia including but not limited to the rare occurrence of death. The patient also understands risks and benefits of the planned surgical procedure include but are not limited to hemorrhage, infection, scarring, loss of vision, progression of disease despite intervention, and need for another procedure.   The patient understands the risks of anesthesia including but not limited to the rare occurrence of death. The patient also understands risks and benefits of the planned surgical procedure include but are not limited to hemorrhage, infection, scarring, loss of vision, progression of disease despite intervention, and need for another procedure.

## 2021-05-19 NOTE — Assessment & Plan Note (Signed)
Vitreomacular traction may cause vision loss from anatomic distortion to the center of the vision, the macula.  If visual function is symptomatic or threatened, therapy may be needed.  Surgical intervention offers the highest chance of visual stability and improvement.  Distortion of the macula anatomy may cause splitting of the retinal layers, termed foveomacular retinoschisis, which can cause more permanent vision loss.  Epiretinal membranes may also be associated.  Macular hole may also develop if vitreomacular traction progresses. The minor form of this condition is Vitreomacular adhesion, which is a natural change in the aging process of the eye, which requires observation only.  Progressive disease now a secondary macular hole.  Nonetheless perifoveal operculum is intact and thus simple vitrectomy alone may be required not internal limiting membrane peel  Will inject gas at the same time

## 2021-05-19 NOTE — Progress Notes (Signed)
05/19/2021     CHIEF COMPLAINT Patient presents for Retina Follow Up (6 Wk F/U OS, poss Avastin OS//Pt c/o decreased VA at distance and near OU x 1 week off and on. Pt sts she can see better at times OU. Pt denies flashes, floaters, or ocular pain OU.)   HISTORY OF PRESENT ILLNESS: Cheryl [name] is a 75 y.o. female who presents to the clinic today for:   HPI    Retina Follow Up    Diagnosis: Wet AMD   Laterality: left eye   Onset: 6 weeks ago   Severity: moderate   Duration: 6 weeks   Course: rapidly worsening   Comments: 6 Wk F/U OS, poss Avastin OS  Pt c/o decreased VA at distance and near OU x 1 week off and on. Pt sts she can see better at times OU. Pt denies flashes, floaters, or ocular pain OU.       Last edited by Hurman Horn, MD on 05/19/2021  2:36 PM. (History)      Referring physician: Jake Samples, PA-C [place],  [place] [id number]  HISTORICAL INFORMATION:   Selected notes from the [facility]: No current outpatient medications on file. (Ophthalmic Drugs)   No current facility-administered medications for this visit. (Ophthalmic Drugs)   Current Outpatient Medications (Other)  Medication Sig  . acetaminophen (TYLENOL) 500 MG tablet Take 500-1,000 mg by mouth as needed (for pain/headache).   Marland Kitchen albuterol (VENTOLIN HFA) 108 (90 Base) MCG/ACT inhaler Inhale 2 puffs into the lungs every 6 (six) hours as needed for wheezing or shortness of breath.  Marland Kitchen amLODipine (NORVASC) 2.5 MG tablet Take 2.5 mg by mouth daily. (Patient not taking: Reported on 05/11/2021)  . buPROPion (WELLBUTRIN XL) 300 MG 24 hr tablet Take 300 mg by mouth daily.  . Cholecalciferol (VITAMIN D3) 125 MCG (5000 UT) CAPS Take 1 capsule by mouth daily.   . clobetasol ointment (TEMOVATE) 0.05 % SMARTSIG:1 Topical Every Night  . clotrimazole-betamethasone (LOTRISONE) cream Apply topically.  . colchicine 0.6 MG tablet Take 0.6 mg by mouth  daily.  . Cyanocobalamin (VITAMIN B-12) 5000 MCG TBDP Take 1,000 mcg by mouth daily.   . cyclophosphamide (CYTOXAN) 25 MG capsule Take 25 mg by mouth daily.   . cyclophosphamide (CYTOXAN) 25 MG capsule TAKE 3 CAPSULES BY MOUTH DAILY  . cyclophosphamide (CYTOXAN) 25 MG capsule TAKE 3 (THREE) CAPSULES BY MOUTH DAILY  . diclofenac sodium (VOLTAREN) 1 % GEL Apply 2-4 g topically 4 (four) times daily. (Patient taking differently: Apply 2-4 g topically as needed.)  . Docusate Sodium (DSS) 100 MG CAPS Take by mouth.  . fluticasone (FLONASE) 50 MCG/ACT nasal spray Place 2 sprays into both nostrils daily as needed for allergies or rhinitis.  . furosemide (LASIX) 80 MG tablet as needed.   Marland Kitchen guaiFENesin (MUCINEX) 600 MG 12 hr tablet Take 1 tablet (600 mg total) by mouth 2 (two) times daily. (Patient taking differently: Take 600 mg by mouth 2 (two) times daily as needed.)  . LINZESS 290 MCG CAPS capsule TAKE 1 CAPSULE BY MOUTH DAILY BEFORE BREAKFAST.  Marland Kitchen losartan (COZAAR) 25 MG tablet Take 12.5 mg by mouth at bedtime. (Patient not taking: Reported on 05/11/2021)  . metoprolol succinate (TOPROL-XL) 50 MG 24 hr tablet Take 50 mg by mouth daily. Take with or immediately following a meal.  . montelukast (SINGULAIR) 10 MG tablet Take 1 tablet (10 mg total) by  mouth every morning.  . nystatin (MYCOSTATIN) 100000 UNIT/ML suspension Take 5 mLs by mouth 2 (two) times daily as needed (for thrush).   . ondansetron (ZOFRAN) 4 MG tablet Take 4 mg by mouth as needed for nausea or vomiting.   . pantoprazole (PROTONIX) 40 MG tablet Take one tablet once or twice daily before a meal for reflux  . polyethylene glycol (MIRALAX / GLYCOLAX) packet Take 17 g by mouth daily as needed for mild constipation or moderate constipation.   . predniSONE (DELTASONE) 20 MG tablet Take 15 mg by mouth daily.  (Patient not taking: Reported on 05/11/2021)  . raloxifene (EVISTA) 60 MG tablet Take 60 mg by mouth daily.  Marland Kitchen rOPINIRole (REQUIP) 0.25 MG  tablet Take 0.75 mg by mouth daily as needed (for rls).   . rosuvastatin (CRESTOR) 10 MG tablet Take 10 mg by mouth at bedtime.  . tolterodine (DETROL) 1 MG tablet Take 1 mg by mouth as needed.  (Patient not taking: Reported on 05/11/2021)  . Vitamin D, Ergocalciferol, (DRISDOL) 1.25 MG (50000 UNIT) CAPS capsule Take 50,000 Units by mouth every 7 (seven) days. (Patient not taking: Reported on 05/11/2021)   No current facility-administered medications for this visit. (Other)      REVIEW OF SYSTEMS:    ALLERGIES Allergies  Allergen Reactions  . Prednisone Other (See Comments)    Hallucinations   . Tramadol Nausea Only  . Aspirin Nausea And Vomiting  . Celecoxib Rash    PAST MEDICAL HISTORY Past Medical History:  Diagnosis Date  . Acid reflux   . Arthritis   . CKD (chronic kidney disease)   . Constipation   . GERD (gastroesophageal reflux disease)   . Neuromuscular disorder ([place])   . PUD (peptic ulcer disease) 2017   gastric ulcer healed on repeat EGD in August 2017  . Sinus drainage   . Sleep apnea    had surgery to correct   Past Surgical History:  Procedure Laterality Date  . ANTERIOR CERVICAL DECOMP/DISCECTOMY FUSION N/A 11/19/2016   Procedure: ANTERIOR CERVICAL DISCECTOMY AND FUSION C5-6 WITH PLATES, SCREWS, CAGE, VIVIGEN II;  Surgeon: Jessy Oto, MD;  Location: [facility];  Service: Orthopedics;  Laterality: N/A;  . CARPAL TUNNEL RELEASE    . CATARACT EXTRACTION, BILATERAL Bilateral 2017   One in December 2017 and one in January 2018  . COLONOSCOPY  02/15/2005   KPT:WSFKC small polyps ablated via cold biopsy, one from transverse colon and two from the rectum/Small external hemorrhoids  . COLONOSCOPY  07/10/2010   LEX:NTZGYF rectum/long redundant colon, polyps in the sigmoid, descending, hepatic flexure/ADENOMATOUS POLYPS. next TCS due 06/2015  . COLONOSCOPY  1995   3 polyps, path revealed chronic colitis  . COLONOSCOPY N/A 08/05/2015   Procedure: COLONOSCOPY;  Surgeon:  Daneil Dolin, MD; melanosis coli, colonic diverticulosis. Repeat colonoscopy in 5 years for surveillance.  . COLONOSCOPY N/A 09/03/2020   Procedure: COLONOSCOPY;  Surgeon: Daneil Dolin, MD;  [facility];  Service: Endoscopy;  Laterality: N/A;  11:15am  . ESOPHAGOGASTRODUODENOSCOPY  1995   Gastritis  . ESOPHAGOGASTRODUODENOSCOPY N/A 04/16/2013   RMR: [facility]  . ESOPHAGOGASTRODUODENOSCOPY N/A 05/13/2016   Procedure: ESOPHAGOGASTRODUODENOSCOPY (EGD);  Surgeon: Daneil Dolin, MD; gastric ulcer and erosions s/p biopsy, erosive gastropathy. Path of reactive and regenerative changes, no H. pylori.  . ESOPHAGOGASTRODUODENOSCOPY N/A 08/18/2016   Procedure: ESOPHAGOGASTRODUODENOSCOPY (EGD);  Surgeon: Daneil Dolin, MD; normal esophagus, previous gastric ulcer completely healed, small hiatal hernia, normal duodenum.  Marland Kitchen KNEE  SURGERY     x2  . POLYPECTOMY  09/03/2020   Procedure: POLYPECTOMY;  Surgeon: Daneil Dolin, MD;  [facility];  Service: Endoscopy;;  . ROTATOR CUFF REPAIR     x2  . SHOULDER ARTHROSCOPY WITH ROTATOR CUFF REPAIR AND SUBACROMIAL DECOMPRESSION Right 01/21/2015   Procedure: RIGHT SHOULDER ARTHROSCOPIC DEBRIDEMNT OF G-H JOINT AND REMOVAL OF LOOSE BODIES,ARTHROSCOPIC SUBACROMIAL DECOMPRESSION,MINI OPEN RCT REPAIR WITH SUPPLEMENTAL [facility] PATCH;  Surgeon: Garald Balding, MD;  Location: [facility];  Service: Orthopedics;  Laterality: Right;  . TONSILLECTOMY    . TRIGGER FINGER RELEASE    . UVULOPALATOPHARYNGOPLASTY      FAMILY HISTORY Family History  Problem Relation Age of Onset  . Cancer Mother   . Cancer Father   . Diabetes Sister   . Colon cancer Neg Hx   . Liver disease Neg Hx     SOCIAL HISTORY Social History   Tobacco Use  . Smoking status: Former Smoker    Packs/day: 0.10    Years: 48.00    Pack years: 4.80    Types: Cigarettes    Quit date: 05/11/2005    Years since quitting: 16.0  . Smokeless tobacco: Never Used  . Tobacco comment: smoked  less than 1 pack a week  Vaping Use  . Vaping Use: Never used  Substance Use Topics  . Alcohol use: No    Alcohol/week: 0.0 standard drinks  . Drug use: No         OPHTHALMIC EXAM:  Base Eye Exam    Visual Acuity (ETDRS)      Right Left   Dist [place] 20/60 20/50 +1   Dist ph [place] NI NI   Correction: Glasses       Tonometry (Tonopen, 2:30 PM)      Right Left   Pressure 15 14       Pupils      Dark Light Shape React APD   Right 5 4 Round Slow None   Left 4 3 Round Slow None       Visual Fields (Counting fingers)      Left Right    Full Full       Extraocular Movement      Right Left    Full Full       Neuro/Psych    Oriented x3: Yes   Mood/Affect: Normal       Dilation    Both eyes: 1.0% Mydriacyl, 2.5% Phenylephrine @ 2:30 PM        Slit Lamp and Fundus Exam    External Exam      Right Left   External Normal Normal       Slit Lamp Exam      Right Left   Lids/Lashes Normal Normal   Conjunctiva/Sclera White and quiet White and quiet   Cornea Clear Clear   Anterior Chamber Deep and quiet Deep and quiet   Iris Round and reactive Round and reactive   Lens Posterior chamber intraocular lens Posterior chamber intraocular lens   Anterior Vitreous Normal Normal       Fundus Exam      Right Left   Posterior Vitreous No PVD completed Normal   Disc Normal Normal   C/D Ratio 0.5 0.5   Macula Negative Watzke, pseudocyst or change macular hole appearance, Macular hole Macular thickening, no subretinal fluid no CME   Vessels Normal Normal   Periphery Normal Normal  IMAGING AND PROCEDURES  Imaging and Procedures for 05/19/21  OCT, Retina - OU - Both Eyes       Right Eye Quality was good. Central Foveal Thickness: 457. Progression has been stable. Findings include vitreomacular adhesion , vitreous traction, abnormal foveal contour.   Left Eye Quality was good. Scan locations included subfoveal. Central Foveal Thickness: 289. Progression has  been stable. Findings include vitreous traction, vitreomacular adhesion , subretinal fluid, pigment epithelial detachment, abnormal foveal contour.   Notes Vitreomacular traction syndrome right eye, with inner foveal schisis and now with outer retinal Dehiscence of the photoreceptor layer.  Today with outer retinal hole, and flap tear with intact operculum.  The operculum held in place by broad vitreomacular traction.   OS broad based vitreomacular traction appears to have been contributing serous retinal detachment associated with pigment epithelial detachment, as this condition has now improved.  Post Avastin but also coincident with progression of partial posterior hyaloid detachment now with only residual vitreal foveal traction.                  ASSESSMENT/PLAN:  Macular hole of right eye Secondary to vitreomacular traction progression now with outer retinal hole and inner retinal break.  Intact prefoveal operculum on the face of the posterior hyaloid with vitreal macular traction.  This suggests: That simple vitrectomy alone with circumferential removal of the posterior hyaloid with preservation of the side of the vitreomacular traction may release the traction, leaving the foveal operculum in place to seal the macular hole with a gas bubble without vitrectomy membrane peel formation,  without removal of the ILM  Vitreomacular traction syndrome, left Vitreomacular traction syndrome has now been confirmed.  Prior OCT evaluation showed broad-based elevation which led to secondary serous retinal detachment of the retina left eye.  Now the hyaloid detachment is progressing in a natural fashion perhaps enhanced or or precipitated by use of intravitreal injections of antivegF to protect the macula.  Nonetheless now only foveal elevation is noted with no retinal schisis yet with acuity decline and with outer retinal thickening at the photoreceptor layer.  The left eye also Will need vitrectomy  if this does not release fully spontaneously  Will not need intravitreal antivegF today  Exudative age-related macular degeneration of left eye with active choroidal neovascularization ([place]) This presumed condition resolved.  No need for injection into vegF OS today  Vitreomacular traction syndrome, with secondary hole formation Vitreomacular traction may cause vision loss from anatomic distortion to the center of the vision, the macula.  If visual function is symptomatic or threatened, therapy may be needed.  Surgical intervention offers the highest chance of visual stability and improvement.  Distortion of the macula anatomy may cause splitting of the retinal layers, termed foveomacular retinoschisis, which can cause more permanent vision loss.  Epiretinal membranes may also be associated.  Macular hole may also develop if vitreomacular traction progresses. The minor form of this condition is Vitreomacular adhesion, which is a natural change in the aging process of the eye, which requires observation only.  Progressive disease now a secondary macular hole.  Nonetheless perifoveal operculum is intact and thus simple vitrectomy alone may be required not internal limiting membrane peel  Will inject gas at the same time      ICD-10-CM   1. Exudative age-related macular degeneration of left eye with active choroidal neovascularization (HCC)  H35.3221 OCT, Retina - OU - Both Eyes  2. Macular hole of right eye  H35.341   3.  Pseudophakia of both eyes  Z96.1   4. Vitreomacular traction syndrome, left  H43.822   5. Vitreomacular traction syndrome, with secondary hole formation  H43.821     1.  OD, with progression of vitreomacular traction syndrome now to a full-thickness macular hole with outer retinal gap in the photoreceptor layer.  Nonetheless the operculum is intact with ongoing vitreous traction.  Thus I we will plan to perform only a vitrectomy and try to preserve the central posterior hyaloid  which is a attached to the retina so as to allow for spontaneous closure of the hole with using gas and drainage of preretinal fluid.  2.  OS, resolution of serous retinal detachment as vitreomacular adhesion has diminished now to just a foveal location.  Thus no injection intravitreal Avastin is required today.  3.  Preoperative drops will be ordered and the patient will be scheduled for surgical intervention next week May 27, 2021  Ophthalmic Meds Ordered this visit:  No orders of the defined types were placed in this encounter.      Return ,, [facility], for Schedule vitrectomy, no membrane peel, right eye, gas injection-[id number].  There are no Patient Instructions on file for this visit.   Explained the diagnoses, plan, and follow up with the patient and they expressed understanding.  Patient expressed understanding of the importance of proper follow up care.   Clent Demark Johnika Escareno M.D. Diseases & Surgery of the Retina and Vitreous [facility] 05/19/21     Abbreviations: M myopia (nearsighted); A astigmatism; H hyperopia (farsighted); P presbyopia; Mrx spectacle prescription;  CTL contact lenses; OD right eye; OS left eye; OU both eyes  XT exotropia; ET esotropia; PEK punctate epithelial keratitis; PEE punctate epithelial erosions; DES dry eye syndrome; MGD meibomian gland dysfunction; ATs artificial tears; PFAT's preservative free artificial tears; [place] nuclear sclerotic cataract; PSC posterior subcapsular cataract; ERM epi-retinal membrane; PVD posterior vitreous detachment; RD retinal detachment; DM diabetes mellitus; DR diabetic retinopathy; NPDR non-proliferative diabetic retinopathy; PDR proliferative diabetic retinopathy; CSME clinically significant macular edema; DME diabetic macular edema; dbh dot blot hemorrhages; CWS cotton wool spot; POAG primary open angle glaucoma; C/D cup-to-disc ratio; HVF humphrey visual field; GVF goldmann visual field; OCT  optical coherence tomography; IOP intraocular pressure; BRVO Branch retinal vein occlusion; CRVO central retinal vein occlusion; CRAO central retinal artery occlusion; BRAO branch retinal artery occlusion; RT retinal tear; SB scleral buckle; PPV pars plana vitrectomy; VH Vitreous hemorrhage; PRP panretinal laser photocoagulation; IVK intravitreal kenalog; VMT vitreomacular traction; MH Macular hole;  NVD neovascularization of the disc; NVE neovascularization elsewhere; AREDS age related eye disease study; ARMD age related macular degeneration; POAG primary open angle glaucoma; EBMD epithelial/anterior basement membrane dystrophy; ACIOL anterior chamber intraocular lens; IOL intraocular lens; PCIOL posterior chamber intraocular lens; Phaco/IOL phacoemulsification with intraocular lens placement; [place] photorefractive keratectomy; LASIK laser assisted in situ keratomileusis; HTN hypertension; DM diabetes mellitus; COPD chronic obstructive pulmonary disease

## 2021-05-19 NOTE — Assessment & Plan Note (Signed)
Vitreomacular traction syndrome has now been confirmed.  Prior OCT evaluation showed broad-based elevation which led to secondary serous retinal detachment of the retina left eye.  Now the hyaloid detachment is progressing in a natural fashion perhaps enhanced or or precipitated by use of intravitreal injections of antivegF to protect the macula.  Nonetheless now only foveal elevation is noted with no retinal schisis yet with acuity decline and with outer retinal thickening at the photoreceptor layer.  The left eye also Will need vitrectomy if this does not release fully spontaneously  Will not need intravitreal antivegF today

## 2021-05-19 NOTE — Assessment & Plan Note (Signed)
This presumed condition resolved.  No need for injection into vegF OS today

## 2021-05-19 NOTE — Assessment & Plan Note (Signed)
Secondary to vitreomacular traction progression now with outer retinal hole and inner retinal break.  Intact prefoveal operculum on the face of the posterior hyaloid with vitreal macular traction.  This suggests: That simple vitrectomy alone with circumferential removal of the posterior hyaloid with preservation of the side of the vitreomacular traction may release the traction, leaving the foveal operculum in place to seal the macular hole with a gas bubble without vitrectomy membrane peel formation,  without removal of the ILM

## 2021-05-21 ENCOUNTER — Other Ambulatory Visit (HOSPITAL_COMMUNITY): Payer: Self-pay | Admitting: *Deleted

## 2021-05-22 ENCOUNTER — Encounter (HOSPITAL_COMMUNITY)
Admission: RE | Admit: 2021-05-22 | Discharge: 2021-05-22 | Disposition: A | Discharge status: Home / Self Care | Payer: Medicare Other | Source: Ambulatory Visit | Attending: Nephrology | Admitting: Nephrology

## 2021-05-22 ENCOUNTER — Other Ambulatory Visit: Payer: Self-pay

## 2021-05-22 VITALS — BP 139/84 | HR 72 | Temp 97.6°F | Resp 20 | Wt 198.0 lb

## 2021-05-22 DIAGNOSIS — N057 Unspecified nephritic syndrome with diffuse crescentic glomerulonephritis: Secondary | ICD-10-CM | POA: Diagnosis not present

## 2021-05-22 DIAGNOSIS — N189 Chronic kidney disease, unspecified: Secondary | ICD-10-CM

## 2021-05-22 DIAGNOSIS — D631 Anemia in chronic kidney disease: Secondary | ICD-10-CM | POA: Insufficient documentation

## 2021-05-22 DIAGNOSIS — G4719 Other hypersomnia: Secondary | ICD-10-CM | POA: Insufficient documentation

## 2021-05-22 LAB — POCT HEMOGLOBIN-HEMACUE: Hemoglobin: 9.9 g/dL — ABNORMAL LOW (ref 12.0–15.0)

## 2021-05-22 MED ORDER — DIPHENHYDRAMINE HCL 25 MG PO CAPS
ORAL_CAPSULE | ORAL | Status: AC
  Administered 2021-05-22: 25 mg via ORAL
  Filled 2021-05-22: qty 1

## 2021-05-22 MED ORDER — DIPHENHYDRAMINE HCL 25 MG PO CAPS: 25.0000 mg | ORAL_CAPSULE | Freq: Once | ORAL | Status: AC

## 2021-05-22 MED ORDER — EPOETIN ALFA-EPBX 10000 UNIT/ML IJ SOLN: 10000.0000 [IU] | INTRAMUSCULAR | Status: DC

## 2021-05-22 MED ORDER — EPOETIN ALFA-EPBX 10000 UNIT/ML IJ SOLN
INTRAMUSCULAR | Status: AC
  Administered 2021-05-22: 10000 [IU] via SUBCUTANEOUS
  Filled 2021-05-22: qty 1

## 2021-05-22 MED ORDER — SODIUM CHLORIDE 0.9 % IV SOLN
1000.0000 mg | Freq: Once | INTRAVENOUS | Status: AC
  Administered 2021-05-22: 1000 mg via INTRAVENOUS
  Filled 2021-05-22: qty 100

## 2021-05-22 MED ORDER — ACETAMINOPHEN 325 MG PO TABS
ORAL_TABLET | ORAL | Status: AC
  Filled 2021-05-22: qty 2

## 2021-05-22 MED ORDER — ACETAMINOPHEN 325 MG PO TABS: 650.0000 mg | ORAL_TABLET | Freq: Once | ORAL | Status: DC

## 2021-05-23 ENCOUNTER — Encounter (HOSPITAL_COMMUNITY): Payer: Self-pay

## 2021-05-23 ENCOUNTER — Other Ambulatory Visit: Payer: Self-pay

## 2021-05-23 ENCOUNTER — Ambulatory Visit
Admission: RE | Admit: 2021-05-23 | Discharge: 2021-05-23 | Disposition: A | Discharge status: Home / Self Care | Payer: Medicare Other | Source: Ambulatory Visit | Attending: Pulmonary Disease | Admitting: Pulmonary Disease

## 2021-05-23 DIAGNOSIS — J984 Other disorders of lung: Secondary | ICD-10-CM

## 2021-05-23 DIAGNOSIS — R0602 Shortness of breath: Secondary | ICD-10-CM | POA: Diagnosis not present

## 2021-05-25 ENCOUNTER — Other Ambulatory Visit (HOSPITAL_COMMUNITY): Payer: Self-pay

## 2021-05-25 MED FILL — Cyclophosphamide Cap 25 MG: ORAL | 5 days supply | Qty: 15 | Fill #1 | Status: AC

## 2021-05-27 ENCOUNTER — Encounter (AMBULATORY_SURGERY_CENTER): Payer: Medicare Other | Admitting: Ophthalmology

## 2021-05-27 DIAGNOSIS — H35341 Macular cyst, hole, or pseudohole, right eye: Secondary | ICD-10-CM | POA: Diagnosis not present

## 2021-05-27 DIAGNOSIS — H43821 Vitreomacular adhesion, right eye: Secondary | ICD-10-CM

## 2021-05-28 ENCOUNTER — Other Ambulatory Visit: Payer: Self-pay

## 2021-05-28 ENCOUNTER — Ambulatory Visit (INDEPENDENT_AMBULATORY_CARE_PROVIDER_SITE_OTHER): Payer: Medicare Other | Admitting: Ophthalmology

## 2021-05-28 ENCOUNTER — Encounter (INDEPENDENT_AMBULATORY_CARE_PROVIDER_SITE_OTHER): Payer: Self-pay | Admitting: Ophthalmology

## 2021-05-28 DIAGNOSIS — H43821 Vitreomacular adhesion, right eye: Secondary | ICD-10-CM

## 2021-05-28 DIAGNOSIS — H35341 Macular cyst, hole, or pseudohole, right eye: Secondary | ICD-10-CM

## 2021-05-28 NOTE — Assessment & Plan Note (Signed)
Outer and inner retinal hole progression at the time of attempted vitrectomy for VMT with broad inner retinal foveal attachment.  Full-thickness macular hole developed with loss of the pre foveal operculum

## 2021-05-28 NOTE — Progress Notes (Signed)
05/28/2021     CHIEF COMPLAINT Patient presents for Post-op Follow-up (1 day post op OD sx 05/27/2021/Pt states, "I am doing well. I did not have any issues last night. I have not any real discomfort. The postioning is what has been hard."/)   HISTORY OF PRESENT ILLNESS: Cheryl [name] is a 75 y.o. female who presents to the clinic today for:   HPI     Post-op Follow-up           Laterality: right eye   Discomfort: none.  Negative for pain, itching, tearing and discharge   Vision: is stable   Comments: 1 day post op OD sx 05/27/2021 Pt states, "I am doing well. I did not have any issues last night. I have not any real discomfort. The postioning is what has been hard."          Comments   Postop day #1 status post attempted vitrectomy for VMT with partial break superficial retina with an outer retinal full-thickness break.  Which progressed to a full-thickness operculum and break upon attempted posterior peripheral vitrectomy with an attempt to leave the vitreal retinal attachment.  Thus full macular hole repair was done with vitrectomy and ILM peel and gas injection      Last edited by Hurman Horn, MD on 05/28/2021  9:08 AM.      Referring physician: Jake Samples, PA-C [place],  [place] [id number]  HISTORICAL INFORMATION:   Selected notes from the MEDICAL RECORD NUMBER       CURRENT MEDICATIONS: Current Outpatient Medications (Ophthalmic Drugs)  Medication Sig   ofloxacin (OCUFLOX) 0.3 % ophthalmic solution Place 1 drop into the right eye every 4 (four) hours for 10 days.   prednisoLONE acetate (PRED FORTE) 1 % ophthalmic suspension Place 1 drop into the right eye 4 (four) times daily.   No current facility-administered medications for this visit. (Ophthalmic Drugs)   Current Outpatient Medications (Other)  Medication Sig   acetaminophen (TYLENOL) 500 MG tablet Take 500-1,000 mg by mouth as needed (for pain/headache).    albuterol  (VENTOLIN HFA) 108 (90 Base) MCG/ACT inhaler Inhale 2 puffs into the lungs every 6 (six) hours as needed for wheezing or shortness of breath.   amLODipine (NORVASC) 2.5 MG tablet Take 2.5 mg by mouth daily. (Patient not taking: Reported on 05/11/2021)   buPROPion (WELLBUTRIN XL) 300 MG 24 hr tablet Take 300 mg by mouth daily.   Cholecalciferol (VITAMIN D3) 125 MCG (5000 UT) CAPS Take 1 capsule by mouth daily.    clobetasol ointment (TEMOVATE) 0.05 % SMARTSIG:1 Topical Every Night   clotrimazole-betamethasone (LOTRISONE) cream Apply topically.   colchicine 0.6 MG tablet Take 0.6 mg by mouth daily.   Cyanocobalamin (VITAMIN B-12) 5000 MCG TBDP Take 1,000 mcg by mouth daily.    cyclophosphamide (CYTOXAN) 25 MG capsule Take 25 mg by mouth daily.    cyclophosphamide (CYTOXAN) 25 MG capsule TAKE 3 CAPSULES BY MOUTH DAILY   diclofenac sodium (VOLTAREN) 1 % GEL Apply 2-4 g topically 4 (four) times daily. (Patient taking differently: Apply 2-4 g topically as needed.)   Docusate Sodium (DSS) 100 MG CAPS Take by mouth.   fluticasone (FLONASE) 50 MCG/ACT nasal spray Place 2 sprays into both nostrils daily as needed for allergies or rhinitis.   furosemide (LASIX) 80 MG tablet as needed.    guaiFENesin (MUCINEX) 600 MG 12 hr tablet Take 1 tablet (600 mg total) by mouth 2 (two) times daily. (Patient  taking differently: Take 600 mg by mouth 2 (two) times daily as needed.)   LINZESS 290 MCG CAPS capsule TAKE 1 CAPSULE BY MOUTH DAILY BEFORE BREAKFAST.   losartan (COZAAR) 25 MG tablet Take 12.5 mg by mouth at bedtime. (Patient not taking: Reported on 05/11/2021)   metoprolol succinate (TOPROL-XL) 50 MG 24 hr tablet Take 50 mg by mouth daily. Take with or immediately following a meal.   montelukast (SINGULAIR) 10 MG tablet Take 1 tablet (10 mg total) by mouth every morning.   nystatin (MYCOSTATIN) 100000 UNIT/ML suspension Take 5 mLs by mouth 2 (two) times daily as needed (for thrush).    ondansetron (ZOFRAN) 4 MG  tablet Take 4 mg by mouth as needed for nausea or vomiting.    pantoprazole (PROTONIX) 40 MG tablet Take one tablet once or twice daily before a meal for reflux   polyethylene glycol (MIRALAX / GLYCOLAX) packet Take 17 g by mouth daily as needed for mild constipation or moderate constipation.    predniSONE (DELTASONE) 20 MG tablet Take 15 mg by mouth daily.  (Patient not taking: Reported on 05/11/2021)   raloxifene (EVISTA) 60 MG tablet Take 60 mg by mouth daily.   rOPINIRole (REQUIP) 0.25 MG tablet Take 0.75 mg by mouth daily as needed (for rls).    rosuvastatin (CRESTOR) 10 MG tablet Take 10 mg by mouth at bedtime.   tolterodine (DETROL) 1 MG tablet Take 1 mg by mouth as needed.  (Patient not taking: Reported on 05/11/2021)   Vitamin D, Ergocalciferol, (DRISDOL) 1.25 MG (50000 UNIT) CAPS capsule Take 50,000 Units by mouth every 7 (seven) days. (Patient not taking: Reported on 05/11/2021)   No current facility-administered medications for this visit. (Other)      REVIEW OF SYSTEMS:    ALLERGIES Allergies  Allergen Reactions   Prednisone Other (See Comments)    Hallucinations    Tramadol Nausea Only   Aspirin Nausea And Vomiting   Celecoxib Rash    PAST MEDICAL HISTORY Past Medical History:  Diagnosis Date   Acid reflux    Arthritis    CKD (chronic kidney disease)    Constipation    GERD (gastroesophageal reflux disease)    Neuromuscular disorder (HCC)    PUD (peptic ulcer disease) 2017   gastric ulcer healed on repeat EGD in August 2017   Sinus drainage    Sleep apnea    had surgery to correct   Past Surgical History:  Procedure Laterality Date   ANTERIOR CERVICAL DECOMP/DISCECTOMY FUSION N/A 11/19/2016   Procedure: ANTERIOR CERVICAL DISCECTOMY AND FUSION C5-6 WITH PLATES, SCREWS, CAGE, VIVIGEN II;  Surgeon: Jessy Oto, MD;  Location: [facility];  Service: Orthopedics;  Laterality: N/A;   CARPAL TUNNEL RELEASE     CATARACT EXTRACTION, BILATERAL Bilateral 2017   One in  December 2017 and one in January 2018   COLONOSCOPY  02/15/2005   YHC:WCBJS small polyps ablated via cold biopsy, one from transverse colon and two from the rectum/Small external hemorrhoids   COLONOSCOPY  07/10/2010   EGB:TDVVOH rectum/long redundant colon, polyps in the sigmoid, descending, hepatic flexure/ADENOMATOUS POLYPS. next TCS due 06/2015   COLONOSCOPY  1995   3 polyps, path revealed chronic colitis   COLONOSCOPY N/A 08/05/2015   Procedure: COLONOSCOPY;  Surgeon: Daneil Dolin, MD; melanosis coli, colonic diverticulosis. Repeat colonoscopy in 5 years for surveillance.   COLONOSCOPY N/A 09/03/2020   Procedure: COLONOSCOPY;  Surgeon: Daneil Dolin, MD;  [facility];  Service: Endoscopy;  Laterality: N/A;  11:15am   ESOPHAGOGASTRODUODENOSCOPY  1995   Gastritis   ESOPHAGOGASTRODUODENOSCOPY N/A 04/16/2013   RMR: [facility]   ESOPHAGOGASTRODUODENOSCOPY N/A 05/13/2016   Procedure: ESOPHAGOGASTRODUODENOSCOPY (EGD);  Surgeon: Daneil Dolin, MD; gastric ulcer and erosions s/p biopsy, erosive gastropathy. Path of reactive and regenerative changes, no H. pylori.   ESOPHAGOGASTRODUODENOSCOPY N/A 08/18/2016   Procedure: ESOPHAGOGASTRODUODENOSCOPY (EGD);  Surgeon: Daneil Dolin, MD; normal esophagus, previous gastric ulcer completely healed, small hiatal hernia, normal duodenum.   KNEE SURGERY     x2   POLYPECTOMY  09/03/2020   Procedure: POLYPECTOMY;  Surgeon: Daneil Dolin, MD;  [facility];  Service: Endoscopy;;   ROTATOR CUFF REPAIR     x2   SHOULDER ARTHROSCOPY WITH ROTATOR CUFF REPAIR AND SUBACROMIAL DECOMPRESSION Right 01/21/2015   Procedure: RIGHT SHOULDER ARTHROSCOPIC DEBRIDEMNT OF G-H JOINT AND REMOVAL OF LOOSE [place] SUBACROMIAL [place] OPEN RCT REPAIR WITH SUPPLEMENTAL [facility] PATCH;  Surgeon: Garald Balding, MD;  Location: [facility];  Service: Orthopedics;  Laterality: Right;   TONSILLECTOMY     TRIGGER FINGER RELEASE      UVULOPALATOPHARYNGOPLASTY      FAMILY HISTORY Family History  Problem Relation Age of Onset   Cancer Mother    Cancer Father    Diabetes Sister    Colon cancer Neg Hx    Liver disease Neg Hx     SOCIAL HISTORY Social History   Tobacco Use   Smoking status: Former    Packs/day: 0.10    Years: 48.00    Pack years: 4.80    Types: Cigarettes    Quit date: 05/11/2005    Years since quitting: 16.0   Smokeless tobacco: Never   Tobacco comments:    smoked less than 1 pack a week  Vaping Use   Vaping Use: Never used  Substance Use Topics   Alcohol use: No    Alcohol/week: 0.0 standard drinks   Drug use: No         OPHTHALMIC EXAM:  Base Eye Exam     Visual Acuity (ETDRS)       Right Left   Dist [place] CF at 2' 20/70 -2   Dist ph [place] NI 20/30 -2         Tonometry (Tonopen, 9:00 AM)       Right Left   Pressure 12 13         Pupils       Pupils Dark Light Shape React APD   Right PERRL 7 7 Dialted     Left PERRL 4 3 Round Slow None         Neuro/Psych     Oriented x3: Yes   Mood/Affect: Normal         Dilation     Right eye: 1.0% Mydriacyl, 2.5% Phenylephrine @ 9:00 AM           Slit Lamp and Fundus Exam     External Exam       Right Left   External Normal Normal         Slit Lamp Exam       Right Left   Lids/Lashes Normal Normal   Conjunctiva/Sclera White and quiet White and quiet   Cornea Clear Clear   Anterior Chamber Deep and quiet Deep and quiet   Iris Round and reactive Round and reactive   Lens Posterior chamber intraocular lens Posterior chamber intraocular lens   Anterior Vitreous Normal Normal  Fundus Exam       Right Left   Posterior Vitreous Clear, avitric, 90% gas    Disc Normal    C/D Ratio 0.5 0.5   Vessels Normal    Periphery Normal             IMAGING AND PROCEDURES  Imaging and Procedures for 05/28/21           ASSESSMENT/PLAN:  Macular hole of right eye Outer and inner  retinal hole progression at the time of attempted vitrectomy for VMT with broad inner retinal foveal attachment.  Full-thickness macular hole developed with loss of the pre foveal operculum     ICD-10-CM   1. Macular hole of right eye  H35.341     2. Vitreomacular traction syndrome, with secondary hole formation  H43.821       1.  2.  3.  Ophthalmic Meds Ordered this visit:  No orders of the defined types were placed in this encounter.      Return in about 1 week (around 06/04/2021) for dilate, OD, OCT.  Patient Instructions  Ofloxacin  4 times daily to the operative eye  Prednisolone acetate 1 drop to the operative eye 4 times daily  Patient instructed not to refill the medications and use them for maximum of 3 weeks.  Patient instructed do not rub the eye.  Patient has the option to use the patch at night.   Macular hole surgery was explained with the need for a gas injection. The patient is aware of proper face down position (like looking downward naturally while  reading a book in a lap) for 3-5 days. Patient was advised to not lay on their back while sleeping or resting, usually for 2 weeks. Do not travel to places of elevation while gas bubble is in the eye, usually for 2 weeks   Patient instructed to use the reading position for the next 4 days   Explained the diagnoses, plan, and follow up with the patient and they expressed understanding.  Patient expressed understanding of the importance of proper follow up care.   Clent Demark Heidee Audi M.D. Diseases & Surgery of the Retina and Vitreous [facility]ina [facility] Di[facility] [place] 05/28/21     Abbreviations: M myopia (nearsighted); A astigmatism; H hyperopia (farsighted); P presbyopia; Mrx spectacle prescription;  CTL contact lenses; OD right eye; OS left eye; OU both eyes  XT exotropia; ET esotropia; PEK punctate epithelial keratitis; PEE punctate epithelial erosions; DES dry eye syndrome; MGD meibomian gland dysfunction; ATs  artificial tears; PFAT's preservative free artificial tears; [place] nuclear sclerotic cataract; PSC posterior subcapsular cataract; ERM epi-retinal membrane; PVD posterior vitreous detachment; RD retinal detachment; DM diabetes mellitus; DR diabetic retinopathy; NPDR non-proliferative diabetic retinopathy; PDR proliferative diabetic retinopathy; CSME clinically significant macular edema; DME diabetic macular edema; dbh dot blot hemorrhages; CWS cotton wool spot; POAG primary open angle glaucoma; C/D cup-to-disc ratio; HVF humphrey visual field; GVF goldmann visual field; OCT optical coherence tomography; IOP intraocular pressure; BRVO Branch retinal vein occlusion; CRVO central retinal vein occlusion; CRAO central retinal artery occlusion; BRAO branch retinal artery occlusion; RT retinal tear; SB scleral buckle; PPV pars plana vitrectomy; VH Vitreous hemorrhage; PRP panretinal laser photocoagulation; IVK intravitreal kenalog; VMT vitreomacular traction; MH Macular hole;  NVD neovascularization of the disc; NVE neovascularization elsewhere; AREDS age related eye disease study; ARMD age related macular degeneration; POAG primary open angle glaucoma; EBMD epithelial/anterior basement membrane dystrophy; ACIOL anterior chamber intraocular lens; IOL intraocular lens; PCIOL posterior chamber intraocular  lens; Phaco/IOL phacoemulsification with intraocular lens placement; [place] photorefractive keratectomy; LASIK laser assisted in situ keratomileusis; HTN hypertension; DM diabetes mellitus; COPD chronic obstructive pulmonary disease

## 2021-05-28 NOTE — Patient Instructions (Signed)
Ofloxacin  4 times daily to the operative eye  Prednisolone acetate 1 drop to the operative eye 4 times daily  Patient instructed not to refill the medications and use them for maximum of 3 weeks.  Patient instructed do not rub the eye.  Patient has the option to use the patch at night.   Macular hole surgery was explained with the need for a gas injection. The patient is aware of proper face down position (like looking downward naturally while  reading a book in a lap) for 3-5 days. Patient was advised to not lay on their back while sleeping or resting, usually for 2 weeks. Do not travel to places of elevation while gas bubble is in the eye, usually for 2 weeks   Patient instructed to use the reading position for the next 4 days

## 2021-06-01 DIAGNOSIS — N185 Chronic kidney disease, stage 5: Secondary | ICD-10-CM | POA: Diagnosis not present

## 2021-06-03 ENCOUNTER — Encounter (INDEPENDENT_AMBULATORY_CARE_PROVIDER_SITE_OTHER): Payer: Medicare Other | Admitting: Ophthalmology

## 2021-06-04 ENCOUNTER — Encounter (INDEPENDENT_AMBULATORY_CARE_PROVIDER_SITE_OTHER): Payer: Self-pay | Admitting: Ophthalmology

## 2021-06-04 ENCOUNTER — Other Ambulatory Visit: Payer: Self-pay

## 2021-06-04 ENCOUNTER — Ambulatory Visit (INDEPENDENT_AMBULATORY_CARE_PROVIDER_SITE_OTHER): Payer: Medicare Other | Admitting: Ophthalmology

## 2021-06-04 DIAGNOSIS — H43821 Vitreomacular adhesion, right eye: Secondary | ICD-10-CM | POA: Diagnosis not present

## 2021-06-04 DIAGNOSIS — H33101 Unspecified retinoschisis, right eye: Secondary | ICD-10-CM | POA: Diagnosis not present

## 2021-06-04 DIAGNOSIS — H35341 Macular cyst, hole, or pseudohole, right eye: Secondary | ICD-10-CM

## 2021-06-04 DIAGNOSIS — Z6841 Body Mass Index (BMI) 40.0 and over, adult: Secondary | ICD-10-CM | POA: Diagnosis not present

## 2021-06-04 DIAGNOSIS — N059 Unspecified nephritic syndrome with unspecified morphologic changes: Secondary | ICD-10-CM | POA: Diagnosis not present

## 2021-06-04 DIAGNOSIS — M150 Primary generalized (osteo)arthritis: Secondary | ICD-10-CM | POA: Diagnosis not present

## 2021-06-04 DIAGNOSIS — M2550 Pain in unspecified joint: Secondary | ICD-10-CM | POA: Diagnosis not present

## 2021-06-04 NOTE — Assessment & Plan Note (Signed)
OD looks great with improving visual acuity post vitrectomy successful closure of macular hole via vitrectomy membrane peel gas injection 1 week prior patient to continue her topical medications for the next 2 weeks and   Discontinue all topical medications in 2 weeks.  Not refill medication should they be completed before this time.

## 2021-06-04 NOTE — Progress Notes (Signed)
06/04/2021     CHIEF COMPLAINT Patient presents for Post-op Follow-up (1 Wk POV OD//Pt reports floaters like "gnats" that move around OD. Pt sts [facility] OD has improved since last week. No ocular pain reported OU.)   HISTORY OF PRESENT ILLNESS: Cheryl [name] is a 75 y.o. female who presents to the clinic today for:   HPI     Post-op Follow-up           Laterality: right eye   Discomfort: floaters   Vision: is improved   Comments: 1 Wk POV OD  Pt reports floaters like "gnats" that move around OD. Pt sts [facility] OD has improved since last week. No ocular pain reported OU.       Last edited by Milly Jakob, [place] on 06/04/2021 10:12 AM.      Referring physician: Jake Samples, PA-C [place],   [id number]  HISTORICAL INFORMATION:   Selected notes from the MEDICAL RECORD NUMBER       CURRENT MEDICATIONS: Current Outpatient Medications (Ophthalmic Drugs)  Medication Sig   prednisoLONE acetate (PRED FORTE) 1 % ophthalmic suspension Place 1 drop into the right eye 4 (four) times daily.   No current facility-administered medications for this visit. (Ophthalmic Drugs)   Current Outpatient Medications (Other)  Medication Sig   acetaminophen (TYLENOL) 500 MG tablet Take 500-1,000 mg by mouth as needed (for pain/headache).    albuterol (VENTOLIN HFA) 108 (90 Base) MCG/ACT inhaler Inhale 2 puffs into the lungs every 6 (six) hours as needed for wheezing or shortness of breath.   amLODipine (NORVASC) 2.5 MG tablet Take 2.5 mg by mouth daily. (Patient not taking: Reported on 05/11/2021)   buPROPion (WELLBUTRIN XL) 300 MG 24 hr tablet Take 300 mg by mouth daily.   Cholecalciferol (VITAMIN D3) 125 MCG (5000 UT) CAPS Take 1 capsule by mouth daily.    clobetasol ointment (TEMOVATE) 0.05 % SMARTSIG:1 Topical Every Night   clotrimazole-betamethasone (LOTRISONE) cream Apply topically.   colchicine 0.6 MG tablet Take 0.6 mg by mouth daily.   Cyanocobalamin (VITAMIN B-12)  5000 MCG TBDP Take 1,000 mcg by mouth daily.    cyclophosphamide (CYTOXAN) 25 MG capsule Take 25 mg by mouth daily.    cyclophosphamide (CYTOXAN) 25 MG capsule TAKE 3 CAPSULES BY MOUTH DAILY   diclofenac sodium (VOLTAREN) 1 % GEL Apply 2-4 g topically 4 (four) times daily. (Patient taking differently: Apply 2-4 g topically as needed.)   Docusate Sodium (DSS) 100 MG CAPS Take by mouth.   fluticasone (FLONASE) 50 MCG/ACT nasal spray Place 2 sprays into both nostrils daily as needed for allergies or rhinitis.   furosemide (LASIX) 80 MG tablet as needed.    guaiFENesin (MUCINEX) 600 MG 12 hr tablet Take 1 tablet (600 mg total) by mouth 2 (two) times daily. (Patient taking differently: Take 600 mg by mouth 2 (two) times daily as needed.)   LINZESS 290 MCG CAPS capsule TAKE 1 CAPSULE BY MOUTH DAILY BEFORE BREAKFAST.   losartan (COZAAR) 25 MG tablet Take 12.5 mg by mouth at bedtime. (Patient not taking: Reported on 05/11/2021)   metoprolol succinate (TOPROL-XL) 50 MG 24 hr tablet Take 50 mg by mouth daily. Take with or immediately following a meal.   montelukast (SINGULAIR) 10 MG tablet Take 1 tablet (10 mg total) by mouth every morning.   nystatin (MYCOSTATIN) 100000 UNIT/ML suspension Take 5 mLs by mouth 2 (two) times daily as needed (for thrush).    ondansetron (ZOFRAN) 4 MG  tablet Take 4 mg by mouth as needed for nausea or vomiting.    pantoprazole (PROTONIX) 40 MG tablet Take one tablet once or twice daily before a meal for reflux   polyethylene glycol (MIRALAX / GLYCOLAX) packet Take 17 g by mouth daily as needed for mild constipation or moderate constipation.    predniSONE (DELTASONE) 20 MG tablet Take 15 mg by mouth daily.  (Patient not taking: Reported on 05/11/2021)   raloxifene (EVISTA) 60 MG tablet Take 60 mg by mouth daily.   rOPINIRole (REQUIP) 0.25 MG tablet Take 0.75 mg by mouth daily as needed (for rls).    rosuvastatin (CRESTOR) 10 MG tablet Take 10 mg by mouth at bedtime.   tolterodine  (DETROL) 1 MG tablet Take 1 mg by mouth as needed.  (Patient not taking: Reported on 05/11/2021)   Vitamin D, Ergocalciferol, (DRISDOL) 1.25 MG (50000 UNIT) CAPS capsule Take 50,000 Units by mouth every 7 (seven) days. (Patient not taking: Reported on 05/11/2021)   No current facility-administered medications for this visit. (Other)      REVIEW OF SYSTEMS:    ALLERGIES Allergies  Allergen Reactions   Prednisone Other (See Comments)    Hallucinations    Tramadol Nausea Only   Aspirin Nausea And Vomiting   Celecoxib Rash    PAST MEDICAL HISTORY Past Medical History:  Diagnosis Date   Acid reflux    Arthritis    CKD (chronic kidney disease)    Constipation    GERD (gastroesophageal reflux disease)    Neuromuscular disorder (HCC)    PUD (peptic ulcer disease) 2017   gastric ulcer healed on repeat EGD in August 2017   Sinus drainage    Sleep apnea    had surgery to correct   Past Surgical History:  Procedure Laterality Date   ANTERIOR CERVICAL DECOMP/DISCECTOMY FUSION N/A 11/19/2016   Procedure: ANTERIOR CERVICAL DISCECTOMY AND FUSION C5-6 WITH PLATES, SCREWS, CAGE, VIVIGEN II;  Surgeon: Jessy Oto, MD;  Location: [facility];  Service: Orthopedics;  Laterality: N/A;   CARPAL TUNNEL RELEASE     CATARACT EXTRACTION, BILATERAL Bilateral 2017   One in December 2017 and one in January 2018   COLONOSCOPY  02/15/2005   NLZ:JQBHA small polyps ablated via cold biopsy, one from transverse colon and two from the rectum/Small external hemorrhoids   COLONOSCOPY  07/10/2010   LPF:[id number] rectum/long redundant colon, polyps in the sigmoid, descending, hepatic flexure/ADENOMATOUS POLYPS. next TCS due 06/2015   COLONOSCOPY  1995   3 polyps, path revealed chronic colitis   COLONOSCOPY N/A 08/05/2015   Procedure: COLONOSCOPY;  Surgeon: Daneil Dolin, MD; melanosis coli, colonic diverticulosis. Repeat colonoscopy in 5 years for surveillance.   COLONOSCOPY N/A 09/03/2020   Procedure: COLONOSCOPY;   Surgeon: Daneil Dolin, MD;  [facility];  Service: Endoscopy;  Laterality: N/A;  11:15am   ESOPHAGOGASTRODUODENOSCOPY  1995   Gastritis   ESOPHAGOGASTRODUODENOSCOPY N/A 04/16/2013   RMR: [facility]   ESOPHAGOGASTRODUODENOSCOPY N/A 05/13/2016   Procedure: ESOPHAGOGASTRODUODENOSCOPY (EGD);  Surgeon: Daneil Dolin, MD; gastric ulcer and erosions s/p biopsy, erosive gastropathy. Path of reactive and regenerative changes, no H. pylori.   ESOPHAGOGASTRODUODENOSCOPY N/A 08/18/2016   Procedure: ESOPHAGOGASTRODUODENOSCOPY (EGD);  Surgeon: Daneil Dolin, MD; normal esophagus, previous gastric ulcer completely healed, small hiatal hernia, normal duodenum.   KNEE SURGERY     x2   POLYPECTOMY  09/03/2020   Procedure: POLYPECTOMY;  Surgeon: Daneil Dolin, MD;  [facility];  Service: Endoscopy;;  ROTATOR CUFF REPAIR     x2   SHOULDER ARTHROSCOPY WITH ROTATOR CUFF REPAIR AND SUBACROMIAL DECOMPRESSION Right 01/21/2015   Procedure: RIGHT SHOULDER ARTHROSCOPIC DEBRIDEMNT OF G-H JOINT AND REMOVAL OF LOOSE BODIES,ARTHROSCOPIC SUBACROMIAL DECOMPRESSION,MINI OPEN RCT REPAIR WITH SUPPLEMENTAL [facility] PATCH;  Surgeon: Garald Balding, MD;  Location: [facility];  Service: Orthopedics;  Laterality: Right;   TONSILLECTOMY     TRIGGER FINGER RELEASE     UVULOPALATOPHARYNGOPLASTY      FAMILY HISTORY Family History  Problem Relation Age of Onset   Cancer Mother    Cancer Father    Diabetes Sister    Colon cancer Neg Hx    Liver disease Neg Hx     SOCIAL HISTORY Social History   Tobacco Use   Smoking status: Former    Packs/day: 0.10    Years: 48.00    Pack years: 4.80    Types: Cigarettes    Quit date: 05/11/2005    Years since quitting: 16.0   Smokeless tobacco: Never   Tobacco comments:    smoked less than 1 pack a week  Vaping Use   Vaping Use: Never used  Substance Use Topics   Alcohol use: No    Alcohol/week: 0.0 standard drinks   Drug use: No         OPHTHALMIC  EXAM:  Base Eye Exam     Visual Acuity (ETDRS)       Right Left   Dist cc 20/50 +1 20/40 +2   Dist ph cc NI NI    Correction: Glasses         Tonometry (Tonopen, 10:17 AM)       Right Left   Pressure 15 13         Pupils       Dark Light Shape React APD   Right 4 3 Round Slow None   Left 3 2 Round Slow None         Visual Fields (Counting fingers)       Left Right    Full Full         Extraocular Movement       Right Left    Full Full         Neuro/Psych     Oriented x3: Yes   Mood/Affect: Normal         Dilation     Right eye: 1.0% Mydriacyl, 2.5% Phenylephrine @ 10:17 AM           Slit Lamp and Fundus Exam     External Exam       Right Left   External Normal Normal         Slit Lamp Exam       Right Left   Lids/Lashes Normal Normal   Conjunctiva/Sclera White and quiet White and quiet   Cornea Clear Clear   Anterior Chamber Deep and quiet Deep and quiet   Iris Round and reactive Round and reactive   Lens Posterior chamber intraocular lens Posterior chamber intraocular lens   Anterior Vitreous Normal Normal         Fundus Exam       Right Left   Posterior Vitreous Clear, avitric, 20% gas    Disc Normal    C/D Ratio 0.5    Macula Macular hole is closed    Vessels Normal    Periphery Normal             IMAGING AND PROCEDURES  Imaging  and Procedures for 06/04/21  OCT, Retina - OU - Both Eyes       Right Eye Quality was good. Central Foveal Thickness: 316. Progression has been stable. Findings include abnormal foveal contour.   Left Eye Quality was good. Scan locations included subfoveal. Central Foveal Thickness: 296. Progression has been stable. Findings include vitreous traction, vitreomacular adhesion , subretinal fluid, pigment epithelial detachment, abnormal foveal contour.   Notes Macular hole closed status post vitrectomy, membrane peel gas injection right eye with stabilized and improving  vision.  Small outer retinal photoreceptor elevation, fluid pocket which is likely to continue to resolve, this is only 1 week post surgery at this date   OS broad based vitreomacular traction appears to have been contributing serous retinal detachment associated with pigment epithelial detachment, as this condition has now improved.  Post Avastin but also coincident with progression of partial posterior hyaloid detachment now with only residual vitreal foveal traction.               ASSESSMENT/PLAN:  Macular hole of right eye OD looks great with improving visual acuity post vitrectomy successful closure of macular hole via vitrectomy membrane peel gas injection 1 week prior patient to continue her topical medications for the next 2 weeks and   Discontinue all topical medications in 2 weeks.  Not refill medication should they be completed before this time.  Macular retinoschisis of right eye Secondary to vitreous traction from VMT and macular hole formation vastly improved 1 week post vitrectomy release of traction  Vitreomacular traction syndrome, with secondary hole formation OD vitreal macular traction resolved now 1 week post repair via vitrectomy membrane peel the release of the VMT and ILM peel     ICD-10-CM   1. Macular hole of right eye  H35.341 OCT, Retina - OU - Both Eyes    2. Macular retinoschisis of right eye  H33.101     3. Vitreomacular traction syndrome, with secondary hole formation  H43.821       1.  Released all activity including travel to elevations and mountains and airplane travel in 5 days.  2.  Patient asked to refrain from lying or sleeping on back for the next 5 days.  3.  Patient to complete complete topical medications over the next 2 weeks or sooner if the bottles are complete.  Patient to discard all topical eye medications remaining at the end of 2 weeks  Ophthalmic Meds Ordered this visit:  No orders of the defined types were placed in this  encounter.      Return in about 8 weeks (around 07/30/2021) for DILATE OU, OCT.  There are no Patient Instructions on file for this visit.   Explained the diagnoses, plan, and follow up with the patient and they expressed understanding.  Patient expressed understanding of the importance of proper follow up care.   Clent Demark Jazariah Teall M.D. Diseases & Surgery of the Retina and Vitreous Retina & Diabetic [place] 06/04/21     Abbreviations: M myopia (nearsighted); A astigmatism; H hyperopia (farsighted); P presbyopia; Mrx spectacle prescription;  CTL contact lenses; OD right eye; OS left eye; OU both eyes  XT exotropia; ET esotropia; PEK punctate epithelial keratitis; PEE punctate epithelial erosions; DES dry eye syndrome; MGD meibomian gland dysfunction; ATs artificial tears; PFAT's preservative free artificial tears; [place] nuclear sclerotic cataract; PSC posterior subcapsular cataract; ERM epi-retinal membrane; PVD posterior vitreous detachment; RD retinal detachment; DM diabetes mellitus; DR diabetic retinopathy; NPDR non-proliferative diabetic retinopathy; PDR  proliferative diabetic retinopathy; CSME clinically significant macular edema; DME diabetic macular edema; dbh dot blot hemorrhages; CWS cotton wool spot; POAG primary open angle glaucoma; C/D cup-to-disc ratio; HVF humphrey visual field; GVF goldmann visual field; OCT optical coherence tomography; IOP intraocular pressure; BRVO Branch retinal vein occlusion; CRVO central retinal vein occlusion; CRAO central retinal artery occlusion; BRAO branch retinal artery occlusion; RT retinal tear; SB scleral buckle; PPV pars plana vitrectomy; VH Vitreous hemorrhage; PRP panretinal laser photocoagulation; IVK intravitreal kenalog; VMT vitreomacular traction; MH Macular hole;  NVD neovascularization of the disc; NVE neovascularization elsewhere; AREDS age related eye disease study; ARMD age related macular degeneration; POAG primary open angle glaucoma;  EBMD epithelial/anterior basement membrane dystrophy; ACIOL anterior chamber intraocular lens; IOL intraocular lens; PCIOL posterior chamber intraocular lens; Phaco/IOL phacoemulsification with intraocular lens placement; [place] photorefractive keratectomy; LASIK laser assisted in situ keratomileusis; HTN hypertension; DM diabetes mellitus; COPD chronic obstructive pulmonary disease

## 2021-06-04 NOTE — Assessment & Plan Note (Signed)
OD vitreal macular traction resolved now 1 week post repair via vitrectomy membrane peel the release of the VMT and ILM peel

## 2021-06-04 NOTE — Assessment & Plan Note (Signed)
Secondary to vitreous traction from VMT and macular hole formation vastly improved 1 week post vitrectomy release of traction

## 2021-06-05 ENCOUNTER — Encounter (HOSPITAL_COMMUNITY)
Admission: RE | Admit: 2021-06-05 | Discharge: 2021-06-05 | Disposition: A | Discharge status: Home / Self Care | Payer: Medicare Other | Source: Ambulatory Visit | Attending: Nephrology | Admitting: Nephrology

## 2021-06-05 VITALS — BP 124/64 | HR 76 | Temp 97.4°F | Resp 20

## 2021-06-05 DIAGNOSIS — D631 Anemia in chronic kidney disease: Secondary | ICD-10-CM | POA: Diagnosis not present

## 2021-06-05 DIAGNOSIS — N189 Chronic kidney disease, unspecified: Secondary | ICD-10-CM | POA: Diagnosis not present

## 2021-06-05 DIAGNOSIS — N057 Unspecified nephritic syndrome with diffuse crescentic glomerulonephritis: Secondary | ICD-10-CM | POA: Diagnosis not present

## 2021-06-05 LAB — IRON AND TIBC
Iron: 94 ug/dL (ref 28–170)
Saturation Ratios: 31 % (ref 10.4–31.8)
TIBC: 304 ug/dL (ref 250–450)
UIBC: 210 ug/dL

## 2021-06-05 LAB — FERRITIN: Ferritin: 614 ng/mL — ABNORMAL HIGH (ref 11–307)

## 2021-06-05 LAB — POCT HEMOGLOBIN-HEMACUE: Hemoglobin: 10.1 g/dL — ABNORMAL LOW (ref 12.0–15.0)

## 2021-06-05 MED ORDER — EPOETIN ALFA-EPBX 10000 UNIT/ML IJ SOLN
INTRAMUSCULAR | Status: AC
  Filled 2021-06-05: qty 1

## 2021-06-05 MED ORDER — EPOETIN ALFA-EPBX 10000 UNIT/ML IJ SOLN
10000.0000 [IU] | INTRAMUSCULAR | Status: DC
  Administered 2021-06-05: 10000 [IU] via SUBCUTANEOUS

## 2021-06-06 IMAGING — US US RENAL
1 series · 14 of 25 positions shown · non-contrast
Comparison: None.

CLINICAL DATA: Acute kidney injury

EXAM:
RENAL / URINARY TRACT ULTRASOUND COMPLETE

[Series 1: us renal · 14 of 43 slices shown]
[im 1/43]
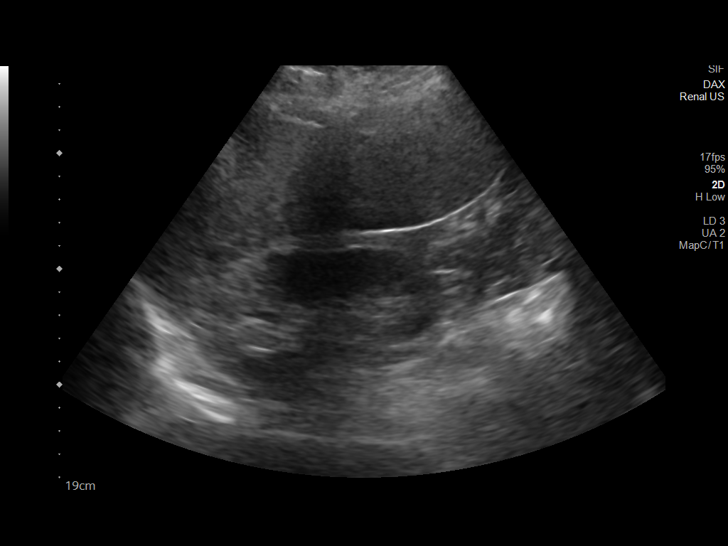
[im 4/43]
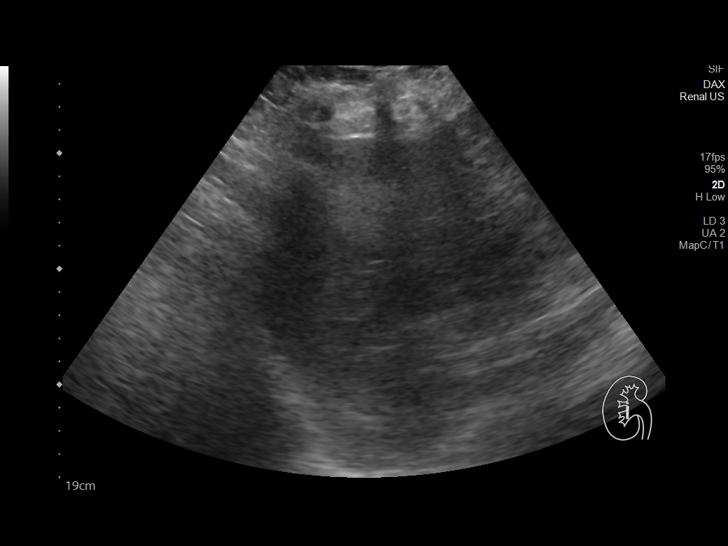
[im 8/43]
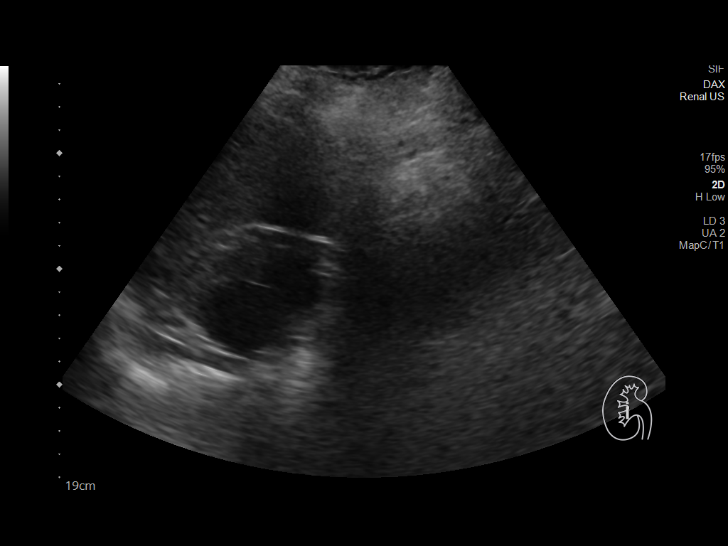
[im 11/43]
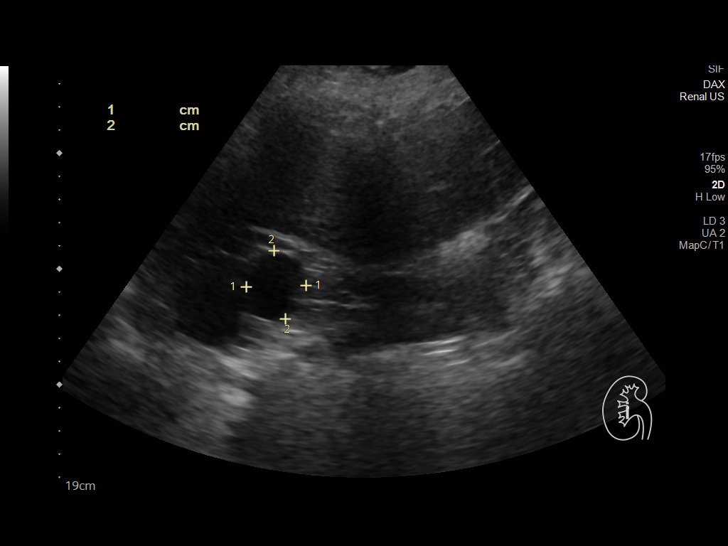
[im 15/43]
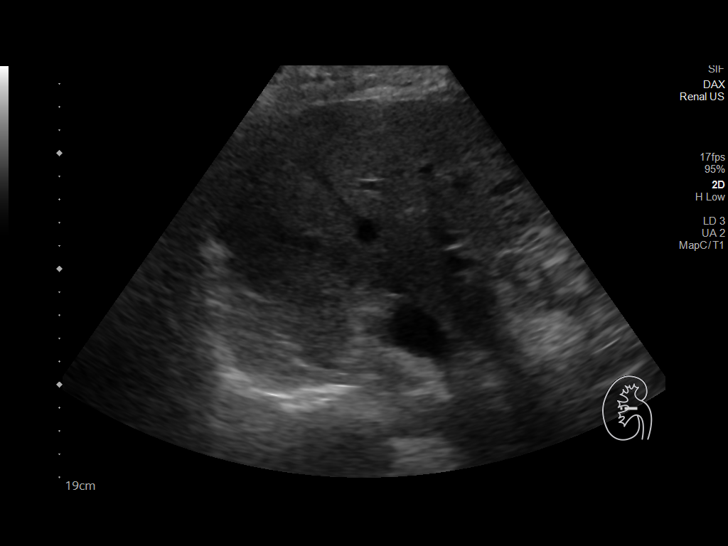
[im 16/43]
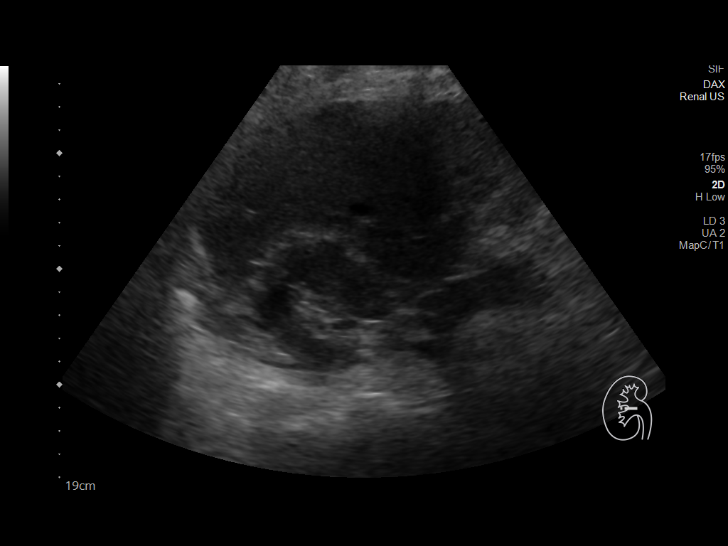
[im 20/43]
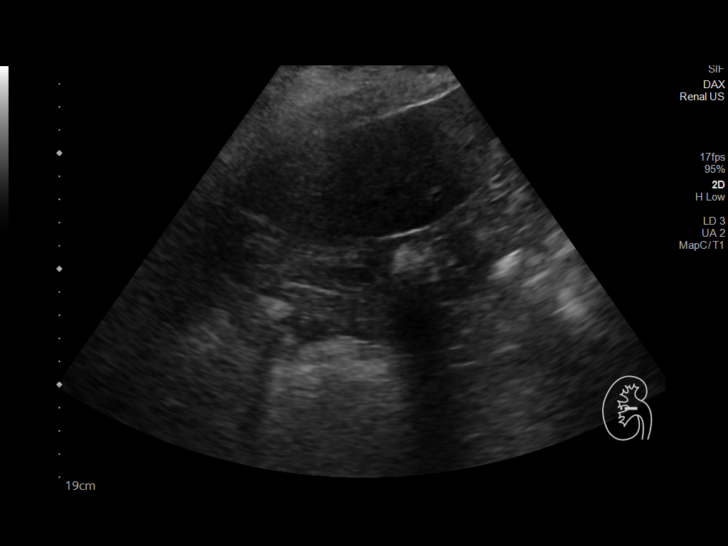
[im 23/43]
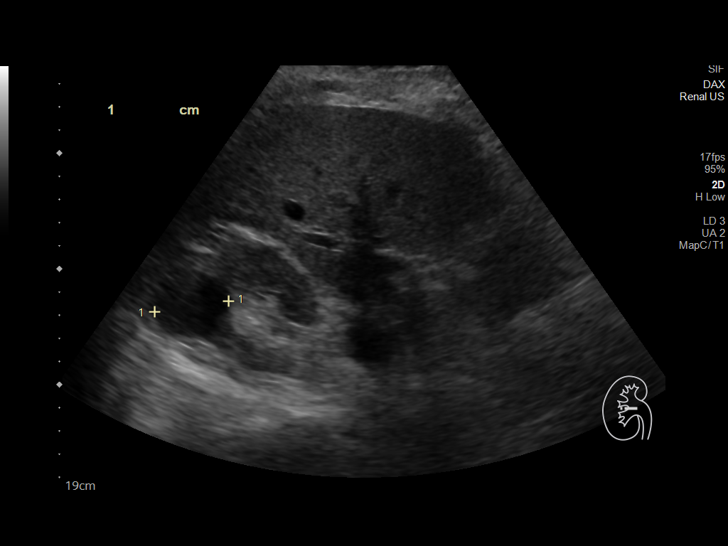
[im 27/43]
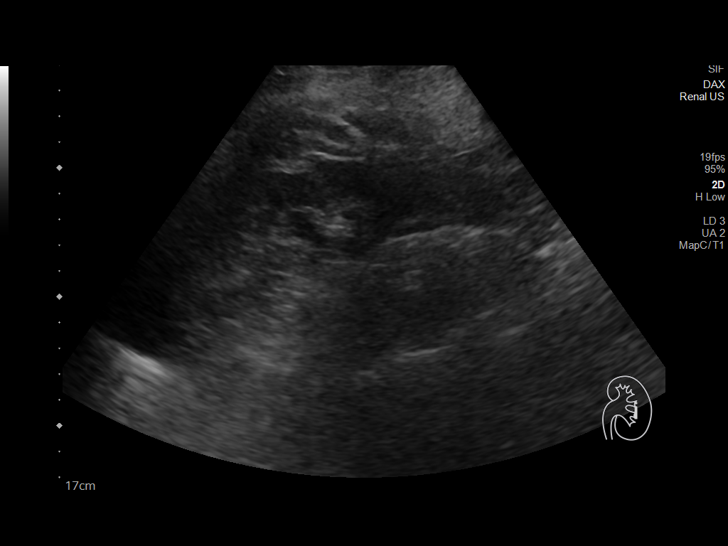
[im 29/43]
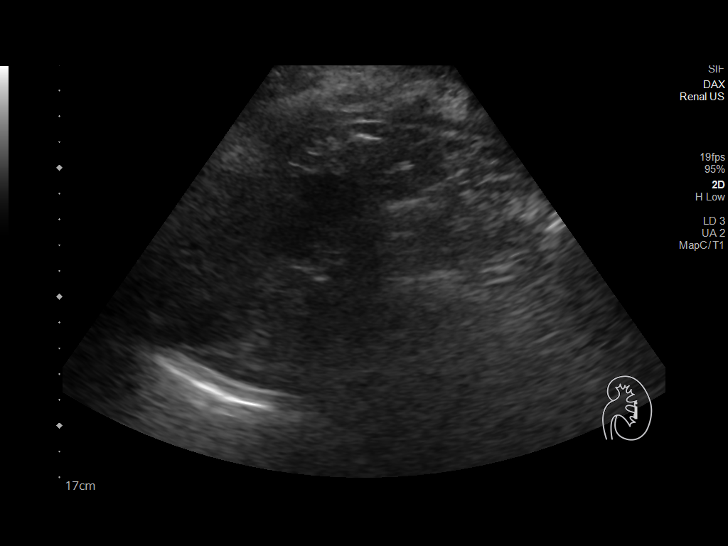
[im 32/43]
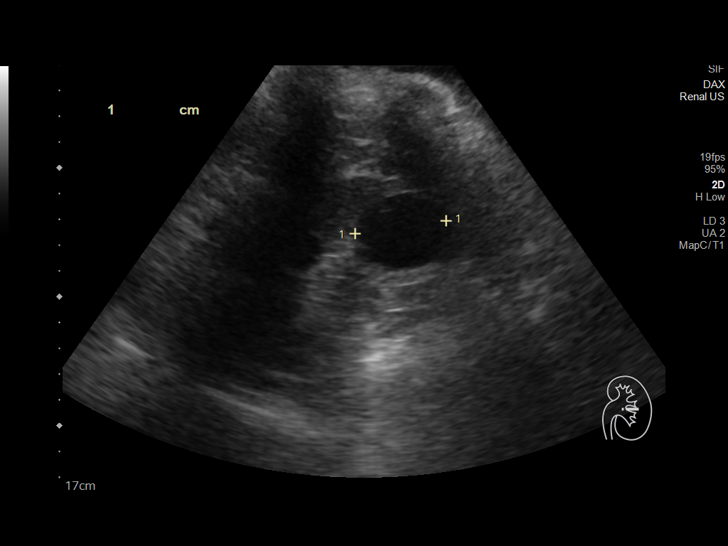
[im 36/43]
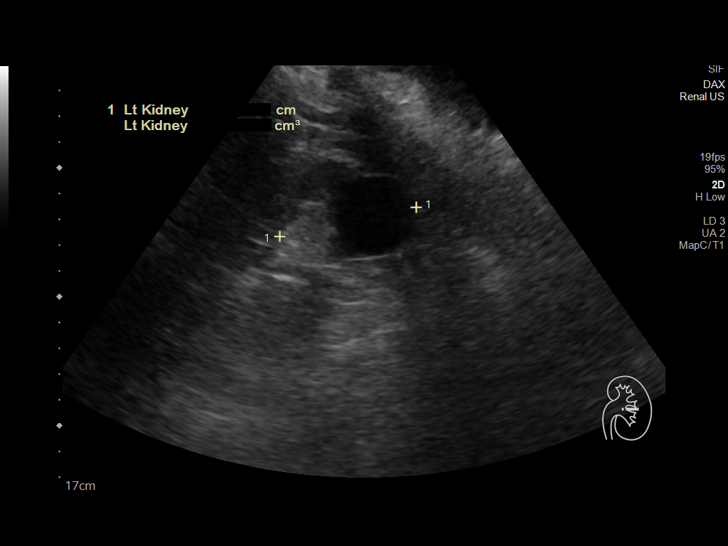
[im 39/43]
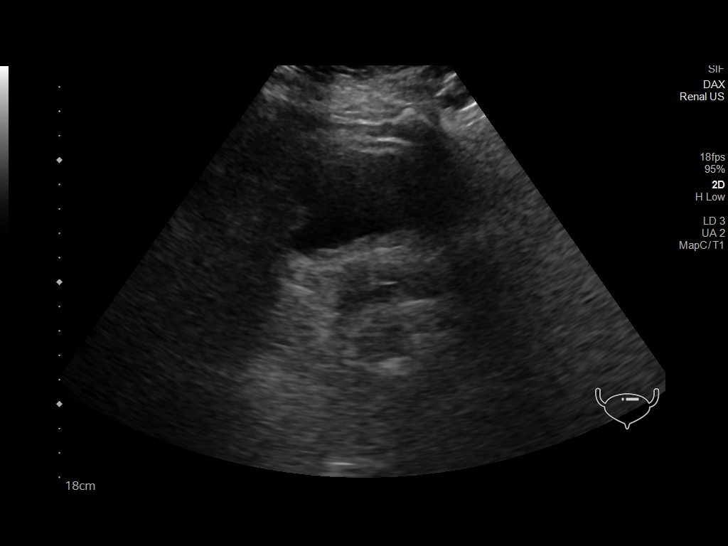
[im 43/43]
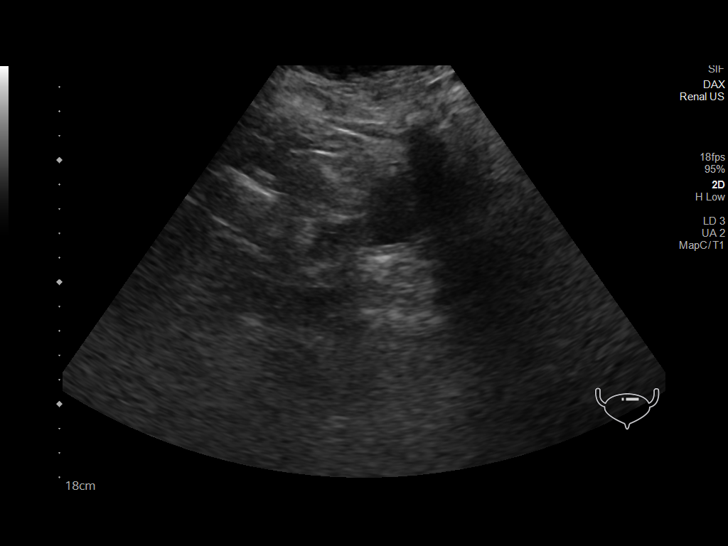

[14 of 25 positions shown; findings below may reference images not displayed]

FINDINGS: Right Kidney:

Renal measurements: 9.2 x 4.5 x 4.6 cm = volume: 95.6 mL. There are
2 discrete simple renal cysts. The larger measures 3.7 x 3.1 x
cm and the smaller measures 2.6 x 3.0 x 2.9 cm. No hydronephrosis.
Normal renal echogenicity. No shadowing calculi.

Left Kidney:

Renal measurements: 9.8 x 5.4 x 5.4 cm = volume: 151.1 mL. There is
a simple renal cyst measuring 3.3 x 2.9 x 3 6 cm. No hydronephrosis
or shadowing calculus. Normal parenchymal echogenicity.

Bladder:

Appears normal for degree of bladder distention.

Other:

None.
IMPRESSION: No hydronephrosis or parenchymal abnormality. Multiple simple renal
cysts.

## 2021-06-08 ENCOUNTER — Encounter (INDEPENDENT_AMBULATORY_CARE_PROVIDER_SITE_OTHER): Payer: Self-pay

## 2021-06-08 DIAGNOSIS — I129 Hypertensive chronic kidney disease with stage 1 through stage 4 chronic kidney disease, or unspecified chronic kidney disease: Secondary | ICD-10-CM | POA: Diagnosis not present

## 2021-06-08 DIAGNOSIS — N058 Unspecified nephritic syndrome with other morphologic changes: Secondary | ICD-10-CM | POA: Diagnosis not present

## 2021-06-08 DIAGNOSIS — N057 Unspecified nephritic syndrome with diffuse crescentic glomerulonephritis: Secondary | ICD-10-CM | POA: Diagnosis not present

## 2021-06-08 DIAGNOSIS — R942 Abnormal results of pulmonary function studies: Secondary | ICD-10-CM | POA: Diagnosis not present

## 2021-06-08 DIAGNOSIS — R768 Other specified abnormal immunological findings in serum: Secondary | ICD-10-CM | POA: Diagnosis not present

## 2021-06-08 DIAGNOSIS — D84821 Immunodeficiency due to drugs: Secondary | ICD-10-CM | POA: Diagnosis not present

## 2021-06-08 DIAGNOSIS — D631 Anemia in chronic kidney disease: Secondary | ICD-10-CM | POA: Diagnosis not present

## 2021-06-08 DIAGNOSIS — N2581 Secondary hyperparathyroidism of renal origin: Secondary | ICD-10-CM | POA: Diagnosis not present

## 2021-06-08 DIAGNOSIS — E785 Hyperlipidemia, unspecified: Secondary | ICD-10-CM | POA: Diagnosis not present

## 2021-06-08 DIAGNOSIS — Z79899 Other long term (current) drug therapy: Secondary | ICD-10-CM | POA: Diagnosis not present

## 2021-06-08 DIAGNOSIS — K219 Gastro-esophageal reflux disease without esophagitis: Secondary | ICD-10-CM | POA: Diagnosis not present

## 2021-06-08 DIAGNOSIS — N184 Chronic kidney disease, stage 4 (severe): Secondary | ICD-10-CM | POA: Diagnosis not present

## 2021-06-15 ENCOUNTER — Telehealth: Payer: Medicare Other | Admitting: Pulmonary Disease

## 2021-06-19 ENCOUNTER — Other Ambulatory Visit: Payer: Self-pay

## 2021-06-19 ENCOUNTER — Encounter (HOSPITAL_COMMUNITY)
Admission: RE | Admit: 2021-06-19 | Discharge: 2021-06-19 | Disposition: A | Discharge status: Home / Self Care | Payer: Medicare Other | Source: Ambulatory Visit | Attending: Nephrology | Admitting: Nephrology

## 2021-06-19 DIAGNOSIS — N189 Chronic kidney disease, unspecified: Secondary | ICD-10-CM

## 2021-06-19 DIAGNOSIS — D631 Anemia in chronic kidney disease: Secondary | ICD-10-CM | POA: Insufficient documentation

## 2021-06-19 LAB — POCT HEMOGLOBIN-HEMACUE: Hemoglobin: 10.5 g/dL — ABNORMAL LOW (ref 12.0–15.0)

## 2021-06-19 MED ORDER — EPOETIN ALFA-EPBX 10000 UNIT/ML IJ SOLN
INTRAMUSCULAR | Status: AC
  Filled 2021-06-19: qty 1

## 2021-06-19 MED ORDER — EPOETIN ALFA-EPBX 10000 UNIT/ML IJ SOLN
10000.0000 [IU] | INTRAMUSCULAR | Status: DC
  Administered 2021-06-19: 10000 [IU] via SUBCUTANEOUS

## 2021-06-30 ENCOUNTER — Ambulatory Visit (INDEPENDENT_AMBULATORY_CARE_PROVIDER_SITE_OTHER): Payer: Medicare Other | Admitting: Pulmonary Disease

## 2021-06-30 ENCOUNTER — Encounter: Payer: Self-pay | Admitting: Pulmonary Disease

## 2021-06-30 ENCOUNTER — Other Ambulatory Visit: Payer: Self-pay

## 2021-06-30 VITALS — BP 112/78 | HR 83 | Temp 98.1°F | Ht 59.0 in | Wt 201.4 lb

## 2021-06-30 DIAGNOSIS — J849 Interstitial pulmonary disease, unspecified: Secondary | ICD-10-CM

## 2021-06-30 DIAGNOSIS — G4719 Other hypersomnia: Secondary | ICD-10-CM | POA: Diagnosis not present

## 2021-06-30 DIAGNOSIS — Z7289 Other problems related to lifestyle: Secondary | ICD-10-CM | POA: Diagnosis not present

## 2021-06-30 DIAGNOSIS — J691 Pneumonitis due to inhalation of oils and essences: Secondary | ICD-10-CM

## 2021-06-30 DIAGNOSIS — Z114 Encounter for screening for human immunodeficiency virus [HIV]: Secondary | ICD-10-CM | POA: Diagnosis not present

## 2021-06-30 MED ORDER — ALBUTEROL SULFATE HFA 108 (90 BASE) MCG/ACT IN AERS: 2.0000 | INHALATION_SPRAY | Freq: Four times a day (QID) | RESPIRATORY_TRACT | 2 refills | Status: DC | PRN

## 2021-06-30 NOTE — Patient Instructions (Addendum)
  LIP/Interstitial lung disease --Recommend serial monitoring with CT Chest without contrast and PFT in one year --Obtain labs: HIV, immunoglobulins, RF, anti-CCP --START Albuterol as needed for shortness of breath or wheezing  Excessive daytime sleepiness --Discussed sleep hygiene --Order home sleep study  Follow-up with me in 6 months

## 2021-06-30 NOTE — Progress Notes (Signed)
Subjective:   PATIENT ID: Cheryl [name] GENDER: female DOB: [birth year], MRN: [id number]   HPI  Chief Complaint  Patient presents with   Follow-up    Pt states she has been doing okay since last visit and states she still becomes SOB with activities.    Reason for Visit: Restrictive lung disease, CT follow-up  [name] is a 75 year old female former smoker (5 pack-years) with pauci immune necrotizing glomerulonephritis on cytoxan  and OSA who presents for follow-up.  Synopsis: Diagnosed with pauci immune necrotizing glomerulonephritis and started on cytoxan 04/2020. On evaluation for kidney transplant, PFTs were incidentally found with restrictive defect with reduced DLCO.  At baseline, she is active and able to perform her ADLs. She does have some shortness of breath with heavy exertion but does not feel this limits her activity. Denies shortness of breath, cough or wheezing when ambulating within house or when performing tasks including grocery shopping or yardwork unless it requires heavy lifting.  On prior visit she did report she has a history of sleep apnea s/p tonsillectomy >10 years ago. She was unable to tolerate CPAP. Currently she is snoring at night. Unsure if she has any apneic episodes. Reports decreased energy, excessive daytime sleepiness/drowsiness. Denies gasping or choking episodes at night but does awaken due to nocturia. Her sleep hygiene is poor and often watches TV into the night.  Social History: Smoked 1/4 ppd since she was 89. Quit in 2006.  Environmental exposures:  Retired Teacher, early years/pre. Wood stove at home which was recently taken out in 2021. >30 years  I have personally reviewed patient's past medical/family/social history/allergies/current medications.  Past Medical History:  Diagnosis Date   Acid reflux    Arthritis    CKD (chronic kidney disease)    Constipation    GERD (gastroesophageal reflux disease)    Neuromuscular disorder  (HCC)    PUD (peptic ulcer disease) 2017   gastric ulcer healed on repeat EGD in August 2017   Sinus drainage    Sleep apnea    had surgery to correct    Allergies  Allergen Reactions   Prednisone Other (See Comments)    Hallucinations    Tramadol Nausea Only   Aspirin Nausea And Vomiting   Celecoxib Rash     Outpatient Medications Prior to Visit  Medication Sig Dispense Refill   acetaminophen (TYLENOL) 500 MG tablet Take 500-1,000 mg by mouth as needed (for pain/headache).      albuterol (VENTOLIN HFA) 108 (90 Base) MCG/ACT inhaler Inhale 2 puffs into the lungs every 6 (six) hours as needed for wheezing or shortness of breath. 18 g 2   amLODipine (NORVASC) 2.5 MG tablet Take 2.5 mg by mouth daily.     buPROPion (WELLBUTRIN XL) 300 MG 24 hr tablet Take 300 mg by mouth daily.     Cholecalciferol (VITAMIN D3) 125 MCG (5000 UT) CAPS Take 1 capsule by mouth daily.      clobetasol ointment (TEMOVATE) 0.05 % SMARTSIG:1 Topical Every Night     clotrimazole-betamethasone (LOTRISONE) cream Apply topically.     colchicine 0.6 MG tablet Take 0.6 mg by mouth daily.     Cyanocobalamin (VITAMIN B-12) 5000 MCG TBDP Take 1,000 mcg by mouth daily.      diclofenac sodium (VOLTAREN) 1 % GEL Apply 2-4 g topically 4 (four) times daily. (Patient taking differently: Apply 2-4 g topically as needed.) 5 Tube 3   Docusate Sodium (DSS) 100 MG CAPS Take by  mouth.     fluticasone (FLONASE) 50 MCG/ACT nasal spray Place 2 sprays into both nostrils daily as needed for allergies or rhinitis.     furosemide (LASIX) 80 MG tablet as needed.      guaiFENesin (MUCINEX) 600 MG 12 hr tablet Take 1 tablet (600 mg total) by mouth 2 (two) times daily. (Patient taking differently: Take 600 mg by mouth 2 (two) times daily as needed.) 20 tablet 0   LINZESS 290 MCG CAPS capsule TAKE 1 CAPSULE BY MOUTH DAILY BEFORE BREAKFAST. 90 capsule 3   losartan (COZAAR) 25 MG tablet Take 12.5 mg by mouth at bedtime.     metoprolol succinate  (TOPROL-XL) 50 MG 24 hr tablet Take 50 mg by mouth daily. Take with or immediately following a meal.     montelukast (SINGULAIR) 10 MG tablet Take 1 tablet (10 mg total) by mouth every morning. 30 tablet 2   nystatin (MYCOSTATIN) 100000 UNIT/ML suspension Take 5 mLs by mouth 2 (two) times daily as needed (for thrush).      ondansetron (ZOFRAN) 4 MG tablet Take 4 mg by mouth as needed for nausea or vomiting.      pantoprazole (PROTONIX) 40 MG tablet Take one tablet once or twice daily before a meal for reflux 60 tablet 3   polyethylene glycol (MIRALAX / GLYCOLAX) packet Take 17 g by mouth daily as needed for mild constipation or moderate constipation.      raloxifene (EVISTA) 60 MG tablet Take 60 mg by mouth daily.     rOPINIRole (REQUIP) 0.25 MG tablet Take 0.75 mg by mouth daily as needed (for rls).      rosuvastatin (CRESTOR) 10 MG tablet Take 10 mg by mouth at bedtime.     Vitamin D, Ergocalciferol, (DRISDOL) 1.25 MG (50000 UNIT) CAPS capsule Take 50,000 Units by mouth every 7 (seven) days.     cyclophosphamide (CYTOXAN) 25 MG capsule TAKE 3 CAPSULES BY MOUTH DAILY 90 capsule 4   cyclophosphamide (CYTOXAN) 25 MG capsule Take 25 mg by mouth daily.      prednisoLONE acetate (PRED FORTE) 1 % ophthalmic suspension Place 1 drop into the right eye 4 (four) times daily. 10 mL 0   predniSONE (DELTASONE) 20 MG tablet Take 15 mg by mouth daily.  (Patient not taking: Reported on 05/11/2021)     tolterodine (DETROL) 1 MG tablet Take 1 mg by mouth as needed.  (Patient not taking: Reported on 05/11/2021)     No facility-administered medications prior to visit.    Review of Systems  Constitutional:  Positive for malaise/fatigue. Negative for chills, diaphoresis, fever and weight loss.  HENT:  Negative for congestion.   Respiratory:  Positive for shortness of breath. Negative for cough, hemoptysis, sputum production and wheezing.   Cardiovascular:  Negative for chest pain, palpitations and leg swelling.   Genitourinary:  Positive for urgency. Negative for dysuria.    Objective:   Vitals:   06/30/21 1413  BP: 112/78  Pulse: 83  Temp: 98.1 F (36.7 C)  TempSrc: Oral  SpO2: 100%  Weight: 201 lb 6.4 oz (91.4 kg)  Height: 4\' 11"  (1.499 m)   SpO2: 100 % (RA) O2 Device: None (Room air)  Body mass index is 40.68 kg/m.  Physical Exam: General: Well-appearing, no acute distress HENT: [place], AT Eyes: EOMI, no scleral icterus Respiratory: Clear to auscultation bilaterally.  No crackles, wheezing or rales Cardiovascular: RRR, -M/R/G, no JVD Extremities:-Edema,-tenderness Neuro: AAO x4, CNII-XII grossly intact Skin: Intact, no rashes or  bruising Psych: Normal mood, normal affect   Data Reviewed:  Imaging: CT A/P Lung fields 02/19/12 Bibasilar atelectasis. Scattered small blebs CXR 04/19/20 - Stable bilateral pulmonary opacities, linear scarring in lingular CT Chest 05/23/21 - Scattered thin walled cysts bilaterally, minimal bibasilar fibrosis. Background emphysema  PFT: 05/11/21 FVC  1.49 (53%) FEV1 1.36 (63%) Ratio 91  TLC 60% DLCO 41% Interpretation: Moderate restrictive defect with moderately reduced diffusing capacity  Labs: CBC    Component Value Date/Time   WBC 14.5 (H) 08/27/2020 1115   RBC 3.61 (L) 08/27/2020 1115   HGB 10.5 (L) 06/19/2021 1057   HCT 39.4 08/27/2020 1115   HCT 37 03/23/2013 0000   PLT 374 08/27/2020 1115   MCV 109.1 (H) 08/27/2020 1115   MCV 91.1 03/23/2013 0000   MCH 33.0 08/27/2020 1115   MCHC 30.2 08/27/2020 1115   RDW 15.6 (H) 08/27/2020 1115   LYMPHSABS 0.6 (L) 08/27/2020 1115   MONOABS 1.6 (H) 08/27/2020 1115   EOSABS 0.0 08/27/2020 1115   BASOSABS 0.0 08/27/2020 1115   BMET    Component Value Date/Time   NA 143 08/27/2020 1115   NA 142 03/23/2013 0000   K 4.5 08/27/2020 1115   K 4.3 03/23/2013 0000   CL 110 08/27/2020 1115   CO2 23 08/27/2020 1115   GLUCOSE 60 (L) 08/27/2020 1115   BUN 62 (H) 08/27/2020 1115   BUN 17 03/23/2013  0000   CREATININE 3.55 (H) 08/27/2020 1115   CREATININE 0.98 03/23/2013 0000   CALCIUM 9.6 08/27/2020 1115   CALCIUM 9.7 08/27/2020 1115   GFRNONAA 12 (L) 08/27/2020 1115   GFRAA 14 (L) 08/27/2020 1115   Ambulatory O2 05/11/21 No desaturations No indication for supplemental oxygen  Imaging, labs and test noted above have been reviewed independently by me.    Assessment & Plan:   Discussion: 75 year old female former smoker (5 pack-years) with pauci immune necrotizing glomerulonephritis. Work-up for restrictive lung disease revealing for ILD concerning for LIP. We discussed the work-up and conservative management. I will discuss this diagnosis with her Nephrologist, Dr. Royce Macadamia. She has dyspnea on exertion however is functional at baseline and able to perform her ADLs.  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 Patient is already on immunosuppressants that would be appropriate for treatment of this disease. Lung biopsy could be pursued if needed for definitive diagnosis but will defer for now as this does not change current management. If this is indeed LIP, current data reports five year survival is 50-66% and median survival reported 11.5 years. We will plan for serial PFTs and CT chest to monitor for stability of her lung disease.  Patient has questions regarding kidney transplant candidacy. Will defer to her Nephrologist and Transplant team. If her lung function remains stable, she would be eligible from a pulmonary standpoint to undergo surgery however she will need to continue discussions with her kidney team.  LIP/Interstitial lung disease --Recommend serial monitoring with CT Chest without contrast and PFT in one year. ADDENDUM: Will plan for PFTs in 6 months --Obtain labs: HIV, immunoglobulins, RF, anti-CCP --START Albuterol as needed for shortness of breath or wheezing  Excessive daytime  sleepiness --Discussed sleep hygiene --Order home sleep study  Health Maintenance Immunization History  Administered Date(s) Administered   Influenza, High Dose Seasonal PF 09/19/2017, 10/02/2018, 10/16/2019   Moderna Sars-Covid-2 Vaccination 02/01/2020, 10/13/2020, 12/29/2020   Pneumococcal Conjugate-13 10/02/2017   Zoster Recombinat (Shingrix)  10/02/2018, 01/11/2019   CT Lung Screen - not indicated  Orders Placed This Encounter  Procedures   CT Chest Wo Contrast    To be completed in 1 year    Standing Status:   Future    Standing Expiration Date:   06/30/2022    Order Specific Question:   Preferred imaging location?    Answer:   [facility] - [address]   IgG, IgA, IgM    Standing Status:   Future    Number of Occurrences:   1    Standing Expiration Date:   06/30/2022   Rheumatoid factor    Standing Status:   Future    Number of Occurrences:   1    Standing Expiration Date:   0/17/5102   Cyclic citrul peptide antibody, IgG    Standing Status:   Future    Number of Occurrences:   1    Standing Expiration Date:   06/30/2022   HIV antibody (with reflex)    Standing Status:   Future    Number of Occurrences:   1    Standing Expiration Date:   06/30/2022   Pulmonary function test    Standing Status:   Future    Standing Expiration Date:   06/30/2022    Order Specific Question:   Where should this test be performed?    Answer:    Pulmonary    Order Specific Question:   Full PFT: includes the following: basic spirometry, spirometry pre & post bronchodilator, diffusion capacity (DLCO), lung volumes    Answer:   Full PFT   Home sleep test    Standing Status:   Future    Standing Expiration Date:   06/30/2022    Order Specific Question:   Where should this test be performed:    Answer:   [facility] - Pulmonary   Meds ordered this encounter  Medications   albuterol (VENTOLIN HFA) 108 (90 Base) MCG/ACT inhaler    Sig: Inhale 2 puffs into the lungs every 6 (six) hours as needed for  wheezing or shortness of breath.    Dispense:  18 g    Refill:  2    Return in about 6 months (around 12/31/2021).  I have spent a total time of 50-minutes on the day of the appointment reviewing prior documentation, coordinating care and discussing medical diagnosis and plan with the patient/family. Imaging, labs and tests included in this note have been reviewed and interpreted independently by me.  Isami Mehra Rodman Pickle, MD [facility] 06/30/2021 2:24 PM  Office Number [phone]

## 2021-07-01 LAB — IGG, IGA, IGM
IgG (Immunoglobin G), Serum: 1230 mg/dL (ref 600–1540)
IgM, Serum: 53 mg/dL (ref 50–300)
Immunoglobulin A: 197 mg/dL (ref 70–320)

## 2021-07-01 LAB — RHEUMATOID FACTOR: Rheumatoid fact SerPl-aCnc: <14 IU/mL (ref <14)

## 2021-07-01 LAB — HIV ANTIBODY (ROUTINE TESTING W REFLEX): HIV 1&2 Ab, 4th Generation: NONREACTIVE

## 2021-07-01 LAB — CYCLIC CITRUL PEPTIDE ANTIBODY, IGG: Cyclic Citrullin Peptide Ab: <16 UNITS

## 2021-07-02 NOTE — Progress Notes (Signed)
Please call and/or mail result letter to patient. All labs for work-up of her interstitial lung disease (Lymphoid interstitial pneumonia) is negative. I have also spoken with her nephrologist regarding her diagnosis and eligibility for transplant. Ultimately her qualification for kidney transplant is up to the Transplant team. If they decide she is a candidate, I believe from a pulmonary standpoint she could tolerate surgery as long as her PFTs are stable. Please schedule her for PFTs for sooner appointment with me in 6 months with PFTs.

## 2021-07-03 ENCOUNTER — Other Ambulatory Visit: Payer: Self-pay

## 2021-07-03 ENCOUNTER — Encounter (HOSPITAL_COMMUNITY)
Admission: RE | Admit: 2021-07-03 | Discharge: 2021-07-03 | Disposition: A | Discharge status: Home / Self Care | Payer: Medicare Other | Source: Ambulatory Visit | Attending: Nephrology | Admitting: Nephrology

## 2021-07-03 VITALS — BP 135/73 | HR 68 | Temp 97.7°F | Resp 20

## 2021-07-03 DIAGNOSIS — N189 Chronic kidney disease, unspecified: Secondary | ICD-10-CM | POA: Diagnosis not present

## 2021-07-03 DIAGNOSIS — D631 Anemia in chronic kidney disease: Secondary | ICD-10-CM

## 2021-07-03 LAB — IRON AND TIBC
Iron: 85 ug/dL (ref 28–170)
Saturation Ratios: 29 % (ref 10.4–31.8)
TIBC: 298 ug/dL (ref 250–450)
UIBC: 213 ug/dL

## 2021-07-03 LAB — FERRITIN: Ferritin: 679 ng/mL — ABNORMAL HIGH (ref 11–307)

## 2021-07-03 LAB — POCT HEMOGLOBIN-HEMACUE: Hemoglobin: 10.6 g/dL — ABNORMAL LOW (ref 12.0–15.0)

## 2021-07-03 MED ORDER — EPOETIN ALFA-EPBX 10000 UNIT/ML IJ SOLN
10000.0000 [IU] | INTRAMUSCULAR | Status: DC
Start: 2021-07-03 — End: 2021-07-04

## 2021-07-03 MED ORDER — EPOETIN ALFA-EPBX 10000 UNIT/ML IJ SOLN
INTRAMUSCULAR | Status: AC
  Administered 2021-07-03: 10000 [IU] via SUBCUTANEOUS
  Filled 2021-07-03: qty 1

## 2021-07-12 ENCOUNTER — Other Ambulatory Visit: Payer: Self-pay | Admitting: Gastroenterology

## 2021-07-12 DIAGNOSIS — R110 Nausea: Secondary | ICD-10-CM

## 2021-07-12 DIAGNOSIS — K219 Gastro-esophageal reflux disease without esophagitis: Secondary | ICD-10-CM

## 2021-07-17 ENCOUNTER — Encounter (HOSPITAL_COMMUNITY)
Admission: RE | Admit: 2021-07-17 | Discharge: 2021-07-17 | Disposition: A | Discharge status: Home / Self Care | Payer: Medicare Other | Source: Ambulatory Visit | Attending: Nephrology | Admitting: Nephrology

## 2021-07-17 ENCOUNTER — Other Ambulatory Visit: Payer: Self-pay

## 2021-07-17 VITALS — BP 133/75 | HR 85 | Temp 98.5°F | Resp 20

## 2021-07-17 DIAGNOSIS — D631 Anemia in chronic kidney disease: Secondary | ICD-10-CM | POA: Diagnosis not present

## 2021-07-17 DIAGNOSIS — N189 Chronic kidney disease, unspecified: Secondary | ICD-10-CM

## 2021-07-17 LAB — POCT HEMOGLOBIN-HEMACUE: Hemoglobin: 11 g/dL — ABNORMAL LOW (ref 12.0–15.0)

## 2021-07-17 MED ORDER — EPOETIN ALFA-EPBX 10000 UNIT/ML IJ SOLN
INTRAMUSCULAR | Status: AC
  Filled 2021-07-17: qty 1

## 2021-07-17 MED ORDER — EPOETIN ALFA-EPBX 10000 UNIT/ML IJ SOLN
10000.0000 [IU] | INTRAMUSCULAR | Status: DC
  Administered 2021-07-17: 10000 [IU] via SUBCUTANEOUS

## 2021-07-30 ENCOUNTER — Encounter (INDEPENDENT_AMBULATORY_CARE_PROVIDER_SITE_OTHER): Payer: Self-pay | Admitting: Ophthalmology

## 2021-07-30 ENCOUNTER — Encounter (INDEPENDENT_AMBULATORY_CARE_PROVIDER_SITE_OTHER): Payer: Medicare Other | Admitting: Ophthalmology

## 2021-07-30 ENCOUNTER — Ambulatory Visit (INDEPENDENT_AMBULATORY_CARE_PROVIDER_SITE_OTHER): Payer: Medicare Other | Admitting: Ophthalmology

## 2021-07-30 ENCOUNTER — Other Ambulatory Visit: Payer: Self-pay

## 2021-07-30 DIAGNOSIS — H43822 Vitreomacular adhesion, left eye: Secondary | ICD-10-CM

## 2021-07-30 DIAGNOSIS — H35341 Macular cyst, hole, or pseudohole, right eye: Secondary | ICD-10-CM

## 2021-07-30 DIAGNOSIS — H43821 Vitreomacular adhesion, right eye: Secondary | ICD-10-CM

## 2021-07-30 NOTE — Assessment & Plan Note (Signed)
Status post vitrectomy and membrane peel gas injection OD, macular hole closed.  Acuity improving

## 2021-07-30 NOTE — Assessment & Plan Note (Signed)
Vastly improved macular anatomy now hole is closed.  Acuity slowly improving

## 2021-07-30 NOTE — Progress Notes (Signed)
07/30/2021     CHIEF COMPLAINT Patient presents for Retina Follow Up   HISTORY OF PRESENT ILLNESS: Cheryl [name] is a 75 y.o. female who presents to the clinic today for:   HPI     Retina Follow Up           Diagnosis: Other   Laterality: both eyes   Onset: 8 weeks ago   Severity: mild   Duration: 8 weeks   Course: gradually improving         Comments   8 week fu ou and oct, now 2 months status post vitrectomy for VMT with secondary macular hole formation  Pt states, "My vision is a lot better than it was last time I was here. I am seeing pretty good."      Last edited by Hurman Horn, MD on 07/30/2021  3:10 PM.      Referring physician: Jake Samples, PA-C [address],  [place] [address]  HISTORICAL INFORMATION:   Selected notes from the MEDICAL RECORD NUMBER       CURRENT MEDICATIONS: No current outpatient medications on file. (Ophthalmic Drugs)   No current facility-administered medications for this visit. (Ophthalmic Drugs)   Current Outpatient Medications (Other)  Medication Sig   acetaminophen (TYLENOL) 500 MG tablet Take 500-1,000 mg by mouth as needed (for pain/headache).    albuterol (VENTOLIN HFA) 108 (90 Base) MCG/ACT inhaler Inhale 2 puffs into the lungs every 6 (six) hours as needed for wheezing or shortness of breath.   amLODipine (NORVASC) 2.5 MG tablet Take 2.5 mg by mouth daily.   buPROPion (WELLBUTRIN XL) 300 MG 24 hr tablet Take 300 mg by mouth daily.   Cholecalciferol (VITAMIN D3) 125 MCG (5000 UT) CAPS Take 1 capsule by mouth daily.    clobetasol ointment (TEMOVATE) 0.05 % SMARTSIG:1 Topical Every Night   clotrimazole-betamethasone (LOTRISONE) cream Apply topically.   colchicine 0.6 MG tablet Take 0.6 mg by mouth daily.   Cyanocobalamin (VITAMIN B-12) 5000 MCG TBDP Take 1,000 mcg by mouth daily.    cyclophosphamide (CYTOXAN) 25 MG capsule TAKE 3 CAPSULES BY MOUTH DAILY   diclofenac sodium (VOLTAREN) 1 % GEL Apply  2-4 g topically 4 (four) times daily. (Patient taking differently: Apply 2-4 g topically as needed.)   Docusate Sodium (DSS) 100 MG CAPS Take by mouth.   fluticasone (FLONASE) 50 MCG/ACT nasal spray Place 2 sprays into both nostrils daily as needed for allergies or rhinitis.   furosemide (LASIX) 80 MG tablet as needed.    guaiFENesin (MUCINEX) 600 MG 12 hr tablet Take 1 tablet (600 mg total) by mouth 2 (two) times daily. (Patient taking differently: Take 600 mg by mouth 2 (two) times daily as needed.)   LINZESS 290 MCG CAPS capsule TAKE 1 CAPSULE BY MOUTH DAILY BEFORE BREAKFAST.   losartan (COZAAR) 25 MG tablet Take 12.5 mg by mouth at bedtime.   metoprolol succinate (TOPROL-XL) 50 MG 24 hr tablet Take 50 mg by mouth daily. Take with or immediately following a meal.   montelukast (SINGULAIR) 10 MG tablet Take 1 tablet (10 mg total) by mouth every morning.   nystatin (MYCOSTATIN) 100000 UNIT/ML suspension Take 5 mLs by mouth 2 (two) times daily as needed (for thrush).    ondansetron (ZOFRAN) 4 MG tablet Take 4 mg by mouth as needed for nausea or vomiting.    pantoprazole (PROTONIX) 40 MG tablet TAKE ONE TABLET ONCE OR TWICE DAILY BEFORE A MEAL FOR REFLUX  polyethylene glycol (MIRALAX / GLYCOLAX) packet Take 17 g by mouth daily as needed for mild constipation or moderate constipation.    raloxifene (EVISTA) 60 MG tablet Take 60 mg by mouth daily.   rOPINIRole (REQUIP) 0.25 MG tablet Take 0.75 mg by mouth daily as needed (for rls).    rosuvastatin (CRESTOR) 10 MG tablet Take 10 mg by mouth at bedtime.   Vitamin D, Ergocalciferol, (DRISDOL) 1.25 MG (50000 UNIT) CAPS capsule Take 50,000 Units by mouth every 7 (seven) days.   No current facility-administered medications for this visit. (Other)      REVIEW OF SYSTEMS:    ALLERGIES Allergies  Allergen Reactions   Prednisone Other (See Comments)    Hallucinations    Tramadol Nausea Only   Aspirin Nausea And Vomiting   Celecoxib Rash     PAST MEDICAL HISTORY Past Medical History:  Diagnosis Date   Acid reflux    Arthritis    CKD (chronic kidney disease)    Constipation    GERD (gastroesophageal reflux disease)    Neuromuscular disorder (HCC)    PUD (peptic ulcer disease) 2017   gastric ulcer healed on repeat EGD in August 2017   Sinus drainage    Sleep apnea    had surgery to correct   Past Surgical History:  Procedure Laterality Date   ANTERIOR CERVICAL DECOMP/DISCECTOMY FUSION N/A 11/19/2016   Procedure: ANTERIOR CERVICAL DISCECTOMY AND FUSION C5-6 WITH PLATES, SCREWS, CAGE, VIVIGEN II;  Surgeon: Jessy Oto, MD;  Location: [facility];  Service: Orthopedics;  Laterality: N/A;   CARPAL TUNNEL RELEASE     CATARACT EXTRACTION, BILATERAL Bilateral 2017   One in December 2017 and one in January 2018   COLONOSCOPY  02/15/2005   BJS:EGBTD small polyps ablated via cold biopsy, one from transverse colon and two from the rectum/Small external hemorrhoids   COLONOSCOPY  07/10/2010   VVO:HYWVPX rectum/long redundant colon, polyps in the sigmoid, descending, hepatic flexure/ADENOMATOUS POLYPS. next TCS due 06/2015   COLONOSCOPY  1995   3 polyps, path revealed chronic colitis   COLONOSCOPY N/A 08/05/2015   Procedure: COLONOSCOPY;  Surgeon: Daneil Dolin, MD; melanosis coli, colonic diverticulosis. Repeat colonoscopy in 5 years for surveillance.   COLONOSCOPY N/A 09/03/2020   Procedure: COLONOSCOPY;  Surgeon: Daneil Dolin, MD;  [facility];  Service: Endoscopy;  Laterality: N/A;  11:15am   ESOPHAGOGASTRODUODENOSCOPY  1995   Gastritis   ESOPHAGOGASTRODUODENOSCOPY N/A 04/16/2013   RMR: [facility]   ESOPHAGOGASTRODUODENOSCOPY N/A 05/13/2016   Procedure: ESOPHAGOGASTRODUODENOSCOPY (EGD);  Surgeon: Daneil Dolin, MD; gastric ulcer and erosions s/p biopsy, erosive gastropathy. Path of reactive and regenerative changes, no H. pylori.   ESOPHAGOGASTRODUODENOSCOPY N/A 08/18/2016   Procedure: ESOPHAGOGASTRODUODENOSCOPY (EGD);   Surgeon: Daneil Dolin, MD; normal esophagus, previous gastric ulcer completely healed, small hiatal hernia, normal duodenum.   KNEE SURGERY     x2   POLYPECTOMY  09/03/2020   Procedure: POLYPECTOMY;  Surgeon: Daneil Dolin, MD;  [facility];  Service: Endoscopy;;   ROTATOR CUFF REPAIR     x2   SHOULDER ARTHROSCOPY WITH ROTATOR CUFF REPAIR AND SUBACROMIAL DECOMPRESSION Right 01/21/2015   Procedure: RIGHT SHOULDER ARTHROSCOPIC DEBRIDEMNT OF G-H JOINT AND REMOVAL OF LOOSE [place] SUBACROMIAL [place] OPEN RCT REPAIR WITH SUPPLEMENTAL [facility] PATCH;  Surgeon: Garald Balding, MD;  Location: [facility];  Service: Orthopedics;  Laterality: Right;   TONSILLECTOMY     TRIGGER FINGER RELEASE     UVULOPALATOPHARYNGOPLASTY  FAMILY HISTORY Family History  Problem Relation Age of Onset   Cancer Mother    Cancer Father    Diabetes Sister    Colon cancer Neg Hx    Liver disease Neg Hx     SOCIAL HISTORY Social History   Tobacco Use   Smoking status: Former    Packs/day: 0.10    Years: 48.00    Pack years: 4.80    Types: Cigarettes    Quit date: 05/11/2005    Years since quitting: 16.2   Smokeless tobacco: Never   Tobacco comments:    smoked less than 1 pack a week  Vaping Use   Vaping Use: Never used  Substance Use Topics   Alcohol use: No    Alcohol/week: 0.0 standard drinks   Drug use: No         OPHTHALMIC EXAM:  Base Eye Exam     Visual Acuity (ETDRS)       Right Left   Dist cc 20/50 +1 20/40 +2   Dist ph cc NI NI    Correction: Glasses         Tonometry (Tonopen, 2:51 PM)       Right Left   Pressure 12 13         Pupils       Pupils Dark Light Shape React APD   Right PERRL 4 3 Round Slow None   Left PERRL 3 2 Round Slow None         Visual Fields (Counting fingers)       Left Right    Full Full         Extraocular Movement       Right Left    Full Full         Neuro/Psych     Oriented x3: Yes    Mood/Affect: Normal         Dilation     Both eyes: 1.0% Mydriacyl, 2.5% Phenylephrine @ 2:51 PM           Slit Lamp and Fundus Exam     External Exam       Right Left   External Normal Normal         Slit Lamp Exam       Right Left   Lids/Lashes Normal Normal   Conjunctiva/Sclera White and quiet White and quiet   Cornea Clear Clear   Anterior Chamber Deep and quiet Deep and quiet   Iris Round and reactive Round and reactive   Lens Posterior chamber intraocular lens Posterior chamber intraocular lens   Anterior Vitreous Normal Normal         Fundus Exam       Right Left   Posterior Vitreous Clear, avitric,  Normal   Disc Normal Normal   C/D Ratio 0.5 0.5   Macula Macular hole is closed Macular thickening, no subretinal fluid no CME   Vessels Normal Normal   Periphery Normal Normal            IMAGING AND PROCEDURES  Imaging and Procedures for 07/30/21  OCT, Retina - OU - Both Eyes       Right Eye Quality was good. Central Foveal Thickness: 299. Progression has been stable. Findings include abnormal foveal contour.   Left Eye Quality was good. Scan locations included subfoveal. Central Foveal Thickness: 296. Progression has been stable. Findings include vitreous traction, vitreomacular adhesion , subretinal fluid, pigment epithelial detachment, abnormal foveal contour.   Notes  Macular hole closed status post vitrectomy, membrane peel gas injection right eye with stabilized and improving vision.  Small outer retinal photoreceptor elevation, fluid pocket which is likely to continue to resolve, this is only 8 week post surgery at this date   OS broad based vitreomacular traction appears to have been contributing serous retinal detachment associated with pigment epithelial detachment, as this condition has now improved.  Post Avastin but also coincident with progression of partial posterior hyaloid detachment now with only residual vitreal foveal  traction.               ASSESSMENT/PLAN:  Vitreomacular traction syndrome, with secondary hole formation Vastly improved macular anatomy now hole is closed.  Acuity slowly improving  Macular hole of right eye Status post vitrectomy and membrane peel gas injection OD, macular hole closed.  Acuity improving  Vitreomacular traction syndrome, left VMT OS with broad posterior vitreous detachment on the foveal region with intraretinal schisis yet not quite acuity  impact, will observe closely      ICD-10-CM   1. Macular hole of right eye  H35.341 OCT, Retina - OU - Both Eyes    2. Vitreomacular adhesion of right eye  H43.821 OCT, Retina - OU - Both Eyes    3. Vitreomacular traction syndrome, left  H43.822       1.  OD 2 months post vitrectomy membrane peel for macular hole secondary to severe VMT.  Hole is closed.  Acuity improved nicely.  Patient has discontinued medications.  2.  VMT OS remains, with intact acuity yet still some impact.  We will monitor closely follow-up in 2 months  3.  Ophthalmic Meds Ordered this visit:  No orders of the defined types were placed in this encounter.      Return in about 2 months (around 09/29/2021) for DILATE OU, OCT.  There are no Patient Instructions on file for this visit.   Explained the diagnoses, plan, and follow up with the patient and they expressed understanding.  Patient expressed understanding of the importance of proper follow up care.   Clent Demark Laneya Gasaway M.D. Diseases & Surgery of the Retina and Vitreous Retina & Diabetic [place] 07/30/21     Abbreviations: M myopia (nearsighted); A astigmatism; H hyperopia (farsighted); P presbyopia; Mrx spectacle prescription;  CTL contact lenses; OD right eye; OS left eye; OU both eyes  XT exotropia; ET esotropia; PEK punctate epithelial keratitis; PEE punctate epithelial erosions; DES dry eye syndrome; MGD meibomian gland dysfunction; ATs artificial tears; PFAT's preservative  free artificial tears; [place] nuclear sclerotic cataract; PSC posterior subcapsular cataract; ERM epi-retinal membrane; PVD posterior vitreous detachment; RD retinal detachment; DM diabetes mellitus; DR diabetic retinopathy; NPDR non-proliferative diabetic retinopathy; PDR proliferative diabetic retinopathy; CSME clinically significant macular edema; DME diabetic macular edema; dbh dot blot hemorrhages; CWS cotton wool spot; POAG primary open angle glaucoma; C/D cup-to-disc ratio; HVF humphrey visual field; GVF goldmann visual field; OCT optical coherence tomography; IOP intraocular pressure; BRVO Branch retinal vein occlusion; CRVO central retinal vein occlusion; CRAO central retinal artery occlusion; BRAO branch retinal artery occlusion; RT retinal tear; SB scleral buckle; PPV pars plana vitrectomy; VH Vitreous hemorrhage; PRP panretinal laser photocoagulation; IVK intravitreal kenalog; VMT vitreomacular traction; MH Macular hole;  NVD neovascularization of the disc; NVE neovascularization elsewhere; AREDS age related eye disease study; ARMD age related macular degeneration; POAG primary open angle glaucoma; EBMD epithelial/anterior basement membrane dystrophy; ACIOL anterior chamber intraocular lens; IOL intraocular lens; PCIOL posterior chamber intraocular lens; Phaco/IOL phacoemulsification  with intraocular lens placement; [place] photorefractive keratectomy; LASIK laser assisted in situ keratomileusis; HTN hypertension; DM diabetes mellitus; COPD chronic obstructive pulmonary disease

## 2021-07-30 NOTE — Assessment & Plan Note (Signed)
VMT OS with broad posterior vitreous detachment on the foveal region with intraretinal schisis yet not quite acuity  impact, will observe closely

## 2021-07-31 ENCOUNTER — Encounter (HOSPITAL_COMMUNITY)
Admission: RE | Admit: 2021-07-31 | Discharge: 2021-07-31 | Disposition: A | Discharge status: Home / Self Care | Payer: Medicare Other | Source: Ambulatory Visit | Attending: Nephrology | Admitting: Nephrology

## 2021-07-31 VITALS — BP 134/53 | HR 80 | Temp 98.1°F | Resp 16

## 2021-07-31 DIAGNOSIS — N189 Chronic kidney disease, unspecified: Secondary | ICD-10-CM | POA: Diagnosis not present

## 2021-07-31 DIAGNOSIS — D631 Anemia in chronic kidney disease: Secondary | ICD-10-CM | POA: Insufficient documentation

## 2021-07-31 LAB — IRON AND TIBC
Iron: 93 ug/dL (ref 28–170)
Saturation Ratios: 28 % (ref 10.4–31.8)
TIBC: 329 ug/dL (ref 250–450)
UIBC: 236 ug/dL

## 2021-07-31 LAB — FERRITIN: Ferritin: 678 ng/mL — ABNORMAL HIGH (ref 11–307)

## 2021-07-31 LAB — POCT HEMOGLOBIN-HEMACUE: Hemoglobin: 10.9 g/dL — ABNORMAL LOW (ref 12.0–15.0)

## 2021-07-31 MED ORDER — EPOETIN ALFA-EPBX 10000 UNIT/ML IJ SOLN
INTRAMUSCULAR | Status: AC
  Filled 2021-07-31: qty 1

## 2021-07-31 MED ORDER — EPOETIN ALFA-EPBX 10000 UNIT/ML IJ SOLN: 10000.0000 [IU] | INTRAMUSCULAR | Status: DC

## 2021-08-03 DIAGNOSIS — N184 Chronic kidney disease, stage 4 (severe): Secondary | ICD-10-CM | POA: Diagnosis not present

## 2021-08-05 DIAGNOSIS — N184 Chronic kidney disease, stage 4 (severe): Secondary | ICD-10-CM | POA: Diagnosis not present

## 2021-08-05 DIAGNOSIS — E875 Hyperkalemia: Secondary | ICD-10-CM | POA: Diagnosis not present

## 2021-08-11 ENCOUNTER — Other Ambulatory Visit: Payer: Self-pay

## 2021-08-11 ENCOUNTER — Ambulatory Visit: Payer: Medicare Other

## 2021-08-11 DIAGNOSIS — G4719 Other hypersomnia: Secondary | ICD-10-CM

## 2021-08-11 DIAGNOSIS — G4733 Obstructive sleep apnea (adult) (pediatric): Secondary | ICD-10-CM | POA: Diagnosis not present

## 2021-08-13 DIAGNOSIS — E782 Mixed hyperlipidemia: Secondary | ICD-10-CM | POA: Diagnosis not present

## 2021-08-13 DIAGNOSIS — Z0001 Encounter for general adult medical examination with abnormal findings: Secondary | ICD-10-CM | POA: Diagnosis not present

## 2021-08-13 DIAGNOSIS — G8929 Other chronic pain: Secondary | ICD-10-CM | POA: Diagnosis not present

## 2021-08-13 DIAGNOSIS — R7309 Other abnormal glucose: Secondary | ICD-10-CM | POA: Diagnosis not present

## 2021-08-13 DIAGNOSIS — Z1331 Encounter for screening for depression: Secondary | ICD-10-CM | POA: Diagnosis not present

## 2021-08-13 DIAGNOSIS — Z1389 Encounter for screening for other disorder: Secondary | ICD-10-CM | POA: Diagnosis not present

## 2021-08-13 DIAGNOSIS — Z6841 Body Mass Index (BMI) 40.0 and over, adult: Secondary | ICD-10-CM | POA: Diagnosis not present

## 2021-08-14 ENCOUNTER — Encounter (HOSPITAL_COMMUNITY)
Admission: RE | Admit: 2021-08-14 | Discharge: 2021-08-14 | Disposition: A | Discharge status: Home / Self Care | Payer: Medicare Other | Source: Ambulatory Visit | Attending: Nephrology | Admitting: Nephrology

## 2021-08-14 ENCOUNTER — Encounter (HOSPITAL_COMMUNITY): Payer: Medicare Other

## 2021-08-14 VITALS — BP 145/92 | HR 85 | Temp 98.1°F | Resp 17

## 2021-08-14 DIAGNOSIS — D84821 Immunodeficiency due to drugs: Secondary | ICD-10-CM | POA: Diagnosis not present

## 2021-08-14 DIAGNOSIS — E875 Hyperkalemia: Secondary | ICD-10-CM | POA: Diagnosis not present

## 2021-08-14 DIAGNOSIS — N184 Chronic kidney disease, stage 4 (severe): Secondary | ICD-10-CM | POA: Diagnosis not present

## 2021-08-14 DIAGNOSIS — R768 Other specified abnormal immunological findings in serum: Secondary | ICD-10-CM | POA: Diagnosis not present

## 2021-08-14 DIAGNOSIS — N057 Unspecified nephritic syndrome with diffuse crescentic glomerulonephritis: Secondary | ICD-10-CM | POA: Diagnosis not present

## 2021-08-14 DIAGNOSIS — N2581 Secondary hyperparathyroidism of renal origin: Secondary | ICD-10-CM | POA: Diagnosis not present

## 2021-08-14 DIAGNOSIS — Z79899 Other long term (current) drug therapy: Secondary | ICD-10-CM | POA: Diagnosis not present

## 2021-08-14 DIAGNOSIS — E785 Hyperlipidemia, unspecified: Secondary | ICD-10-CM | POA: Diagnosis not present

## 2021-08-14 DIAGNOSIS — N058 Unspecified nephritic syndrome with other morphologic changes: Secondary | ICD-10-CM | POA: Diagnosis not present

## 2021-08-14 DIAGNOSIS — N189 Chronic kidney disease, unspecified: Secondary | ICD-10-CM

## 2021-08-14 DIAGNOSIS — G4733 Obstructive sleep apnea (adult) (pediatric): Secondary | ICD-10-CM | POA: Diagnosis not present

## 2021-08-14 DIAGNOSIS — K219 Gastro-esophageal reflux disease without esophagitis: Secondary | ICD-10-CM | POA: Diagnosis not present

## 2021-08-14 DIAGNOSIS — D631 Anemia in chronic kidney disease: Secondary | ICD-10-CM | POA: Diagnosis not present

## 2021-08-14 DIAGNOSIS — I129 Hypertensive chronic kidney disease with stage 1 through stage 4 chronic kidney disease, or unspecified chronic kidney disease: Secondary | ICD-10-CM | POA: Diagnosis not present

## 2021-08-14 LAB — POCT HEMOGLOBIN-HEMACUE: Hemoglobin: 11 g/dL — ABNORMAL LOW (ref 12.0–15.0)

## 2021-08-14 MED ORDER — EPOETIN ALFA-EPBX 10000 UNIT/ML IJ SOLN
10000.0000 [IU] | INTRAMUSCULAR | Status: DC
  Administered 2021-08-14: 10000 [IU] via SUBCUTANEOUS

## 2021-08-14 MED ORDER — EPOETIN ALFA-EPBX 10000 UNIT/ML IJ SOLN
INTRAMUSCULAR | Status: AC
  Filled 2021-08-14: qty 1

## 2021-08-18 ENCOUNTER — Telehealth: Payer: Self-pay | Admitting: Pulmonary Disease

## 2021-08-18 DIAGNOSIS — G4733 Obstructive sleep apnea (adult) (pediatric): Secondary | ICD-10-CM

## 2021-08-18 NOTE — Telephone Encounter (Signed)
Pulmonary Progress Note  Reviewed sleep study.  AHI 44/hr. SpO2 nadir 69% Discussed findings with patient. Findings consistent with severe OSA. We discussed treatment option which is limited to CPAP. She is not an Insp[facility] candidate and would not recommend oral compliance due to BMI and severity of OSA. She agrees for PAP titration  Plan Staff - please order PAP titration and add the following comment: Start with CPAP. If not tolerated, ok to titrate with BiPAP for patient comfort.  Rodman Pickle, M.D. [facility] Pulmonary/Critical Care Medicine 08/18/2021 4:17 PM   See Amion for personal pager For hours between 7 PM to 7 AM, please call Elink for urgent questions

## 2021-08-18 NOTE — Telephone Encounter (Signed)
Message received from Dr. Loanne Drilling to order pap titration. CPAP titration has been ordered and the following instructions have been included in order: Start with CPAP. If not tolerated, ok to titrate with BiPAP for patient comfort. Nothing further needed at this time.

## 2021-08-28 ENCOUNTER — Other Ambulatory Visit: Payer: Self-pay

## 2021-08-28 ENCOUNTER — Encounter (HOSPITAL_COMMUNITY)
Admission: RE | Admit: 2021-08-28 | Discharge: 2021-08-28 | Disposition: A | Discharge status: Home / Self Care | Payer: Medicare Other | Source: Ambulatory Visit | Attending: Nephrology | Admitting: Nephrology

## 2021-08-28 VITALS — BP 132/76 | HR 72 | Resp 18

## 2021-08-28 DIAGNOSIS — D631 Anemia in chronic kidney disease: Secondary | ICD-10-CM | POA: Insufficient documentation

## 2021-08-28 DIAGNOSIS — N189 Chronic kidney disease, unspecified: Secondary | ICD-10-CM

## 2021-08-28 LAB — IRON AND TIBC
Iron: 60 ug/dL (ref 28–170)
Saturation Ratios: 20 % (ref 10.4–31.8)
TIBC: 305 ug/dL (ref 250–450)
UIBC: 245 ug/dL

## 2021-08-28 LAB — FERRITIN: Ferritin: 551 ng/mL — ABNORMAL HIGH (ref 11–307)

## 2021-08-28 LAB — POCT HEMOGLOBIN-HEMACUE: Hemoglobin: 10.8 g/dL — ABNORMAL LOW (ref 12.0–15.0)

## 2021-08-28 MED ORDER — EPOETIN ALFA-EPBX 10000 UNIT/ML IJ SOLN: 10000.0000 [IU] | INTRAMUSCULAR | Status: DC

## 2021-08-28 MED ORDER — EPOETIN ALFA-EPBX 10000 UNIT/ML IJ SOLN
INTRAMUSCULAR | Status: AC
  Administered 2021-08-28: 10000 [IU] via SUBCUTANEOUS
  Filled 2021-08-28: qty 1

## 2021-09-08 ENCOUNTER — Other Ambulatory Visit: Payer: Self-pay | Admitting: Gastroenterology

## 2021-09-08 DIAGNOSIS — R110 Nausea: Secondary | ICD-10-CM

## 2021-09-08 DIAGNOSIS — K219 Gastro-esophageal reflux disease without esophagitis: Secondary | ICD-10-CM

## 2021-09-10 ENCOUNTER — Encounter (HOSPITAL_COMMUNITY)
Admission: RE | Admit: 2021-09-10 | Discharge: 2021-09-10 | Disposition: A | Discharge status: Home / Self Care | Payer: Medicare Other | Source: Ambulatory Visit | Attending: Nephrology | Admitting: Nephrology

## 2021-09-10 ENCOUNTER — Other Ambulatory Visit: Payer: Self-pay

## 2021-09-10 VITALS — BP 125/57 | HR 79 | Temp 98.6°F | Resp 18

## 2021-09-10 DIAGNOSIS — D631 Anemia in chronic kidney disease: Secondary | ICD-10-CM

## 2021-09-10 DIAGNOSIS — N189 Chronic kidney disease, unspecified: Secondary | ICD-10-CM | POA: Diagnosis not present

## 2021-09-10 LAB — POCT HEMOGLOBIN-HEMACUE: Hemoglobin: 11.5 g/dL — ABNORMAL LOW (ref 12.0–15.0)

## 2021-09-10 MED ORDER — EPOETIN ALFA-EPBX 10000 UNIT/ML IJ SOLN
INTRAMUSCULAR | Status: AC
  Administered 2021-09-10: 10000 [IU] via SUBCUTANEOUS
  Filled 2021-09-10: qty 1

## 2021-09-10 MED ORDER — EPOETIN ALFA-EPBX 10000 UNIT/ML IJ SOLN: 10000.0000 [IU] | INTRAMUSCULAR | Status: DC

## 2021-09-11 ENCOUNTER — Encounter (HOSPITAL_COMMUNITY): Payer: Medicare Other

## 2021-09-21 DIAGNOSIS — N184 Chronic kidney disease, stage 4 (severe): Secondary | ICD-10-CM | POA: Diagnosis not present

## 2021-09-24 ENCOUNTER — Encounter (HOSPITAL_COMMUNITY)
Admission: RE | Admit: 2021-09-24 | Discharge: 2021-09-24 | Disposition: A | Discharge status: Home / Self Care | Payer: Medicare Other | Source: Ambulatory Visit | Attending: Nephrology | Admitting: Nephrology

## 2021-09-24 ENCOUNTER — Other Ambulatory Visit: Payer: Self-pay

## 2021-09-24 VITALS — BP 139/87 | HR 87 | Temp 97.3°F | Resp 18

## 2021-09-24 DIAGNOSIS — N189 Chronic kidney disease, unspecified: Secondary | ICD-10-CM | POA: Insufficient documentation

## 2021-09-24 DIAGNOSIS — D631 Anemia in chronic kidney disease: Secondary | ICD-10-CM | POA: Diagnosis not present

## 2021-09-24 DIAGNOSIS — N184 Chronic kidney disease, stage 4 (severe): Secondary | ICD-10-CM | POA: Insufficient documentation

## 2021-09-24 LAB — IRON AND TIBC
Iron: 59 ug/dL (ref 28–170)
Saturation Ratios: 20 % (ref 10.4–31.8)
TIBC: 290 ug/dL (ref 250–450)
UIBC: 231 ug/dL

## 2021-09-24 LAB — POCT HEMOGLOBIN-HEMACUE: Hemoglobin: 10.9 g/dL — ABNORMAL LOW (ref 12.0–15.0)

## 2021-09-24 LAB — FERRITIN: Ferritin: 513 ng/mL — ABNORMAL HIGH (ref 11–307)

## 2021-09-24 MED ORDER — EPOETIN ALFA-EPBX 10000 UNIT/ML IJ SOLN
INTRAMUSCULAR | Status: AC
  Administered 2021-09-24: 10000 [IU] via SUBCUTANEOUS
  Filled 2021-09-24: qty 1

## 2021-09-24 MED ORDER — EPOETIN ALFA-EPBX 10000 UNIT/ML IJ SOLN: 10000.0000 [IU] | INTRAMUSCULAR | Status: DC

## 2021-09-28 DIAGNOSIS — D631 Anemia in chronic kidney disease: Secondary | ICD-10-CM | POA: Diagnosis not present

## 2021-09-28 DIAGNOSIS — Z79899 Other long term (current) drug therapy: Secondary | ICD-10-CM | POA: Diagnosis not present

## 2021-09-28 DIAGNOSIS — E785 Hyperlipidemia, unspecified: Secondary | ICD-10-CM | POA: Diagnosis not present

## 2021-09-28 DIAGNOSIS — Z23 Encounter for immunization: Secondary | ICD-10-CM | POA: Diagnosis not present

## 2021-09-28 DIAGNOSIS — E875 Hyperkalemia: Secondary | ICD-10-CM | POA: Diagnosis not present

## 2021-09-28 DIAGNOSIS — N057 Unspecified nephritic syndrome with diffuse crescentic glomerulonephritis: Secondary | ICD-10-CM | POA: Diagnosis not present

## 2021-09-28 DIAGNOSIS — I129 Hypertensive chronic kidney disease with stage 1 through stage 4 chronic kidney disease, or unspecified chronic kidney disease: Secondary | ICD-10-CM | POA: Diagnosis not present

## 2021-09-28 DIAGNOSIS — D84821 Immunodeficiency due to drugs: Secondary | ICD-10-CM | POA: Diagnosis not present

## 2021-09-28 DIAGNOSIS — N2581 Secondary hyperparathyroidism of renal origin: Secondary | ICD-10-CM | POA: Diagnosis not present

## 2021-09-28 DIAGNOSIS — R768 Other specified abnormal immunological findings in serum: Secondary | ICD-10-CM | POA: Diagnosis not present

## 2021-09-28 DIAGNOSIS — N058 Unspecified nephritic syndrome with other morphologic changes: Secondary | ICD-10-CM | POA: Diagnosis not present

## 2021-09-28 DIAGNOSIS — N184 Chronic kidney disease, stage 4 (severe): Secondary | ICD-10-CM | POA: Diagnosis not present

## 2021-10-01 ENCOUNTER — Other Ambulatory Visit: Payer: Self-pay

## 2021-10-01 ENCOUNTER — Encounter (INDEPENDENT_AMBULATORY_CARE_PROVIDER_SITE_OTHER): Payer: Medicare Other | Admitting: Ophthalmology

## 2021-10-01 ENCOUNTER — Ambulatory Visit (INDEPENDENT_AMBULATORY_CARE_PROVIDER_SITE_OTHER): Payer: Medicare Other | Admitting: Ophthalmology

## 2021-10-01 ENCOUNTER — Encounter (INDEPENDENT_AMBULATORY_CARE_PROVIDER_SITE_OTHER): Payer: Self-pay | Admitting: Ophthalmology

## 2021-10-01 DIAGNOSIS — H43822 Vitreomacular adhesion, left eye: Secondary | ICD-10-CM

## 2021-10-01 DIAGNOSIS — Z6841 Body Mass Index (BMI) 40.0 and over, adult: Secondary | ICD-10-CM

## 2021-10-01 DIAGNOSIS — H43812 Vitreous degeneration, left eye: Secondary | ICD-10-CM | POA: Diagnosis not present

## 2021-10-01 DIAGNOSIS — H43821 Vitreomacular adhesion, right eye: Secondary | ICD-10-CM | POA: Diagnosis not present

## 2021-10-01 DIAGNOSIS — H33102 Unspecified retinoschisis, left eye: Secondary | ICD-10-CM

## 2021-10-01 DIAGNOSIS — H35341 Macular cyst, hole, or pseudohole, right eye: Secondary | ICD-10-CM

## 2021-10-01 DIAGNOSIS — H33101 Unspecified retinoschisis, right eye: Secondary | ICD-10-CM | POA: Diagnosis not present

## 2021-10-01 DIAGNOSIS — J9601 Acute respiratory failure with hypoxia: Secondary | ICD-10-CM | POA: Diagnosis not present

## 2021-10-01 NOTE — Assessment & Plan Note (Signed)
OS, recent VMT with tractional change on the macula for several months and is now spontaneously resolved.  Prior serous retinal detachment in the left eye resolved followed by foveal macular schisis followed by release of VMT now to completion of PVD and recovery of acuity

## 2021-10-01 NOTE — Assessment & Plan Note (Signed)
Now spontaneous resolution and release of foveal traction with less extensive foveal macular schisis

## 2021-10-01 NOTE — Assessment & Plan Note (Signed)
This condition resolved post vitrectomy

## 2021-10-01 NOTE — Assessment & Plan Note (Signed)
This condition now resolved and improved post surgical correction, 4 months previous

## 2021-10-01 NOTE — Progress Notes (Signed)
10/01/2021     CHIEF COMPLAINT Patient presents for  Chief Complaint  Patient presents with   Retina Follow Up      HISTORY OF PRESENT ILLNESS: Cheryl [name] is a 75 y.o. female who presents to the clinic today for:   HPI     Retina Follow Up   Patient presents with  Other.  In both eyes.  This started 9 weeks ago.  Duration of 9 weeks.        Comments   9 week f/u OU with OCT  Pt states her vision is improved since previous visit. States she has been able to started reading again. Pt denies any new flashes or floaters. Pt denies any eye pain.  EyeMeds: None      Last edited by Reather Littler, COA on 10/01/2021  1:56 PM.      Referring physician: Clent Jacks, MD [address] [place],  [place] [address]  HISTORICAL INFORMATION:   Selected notes from the [facility]: No current outpatient medications on file. (Ophthalmic Drugs)   No current facility-administered medications for this visit. (Ophthalmic Drugs)   Current Outpatient Medications (Other)  Medication Sig   acetaminophen (TYLENOL) 500 MG tablet Take 500-1,000 mg by mouth as needed (for pain/headache).    albuterol (VENTOLIN HFA) 108 (90 Base) MCG/ACT inhaler Inhale 2 puffs into the lungs every 6 (six) hours as needed for wheezing or shortness of breath.   amLODipine (NORVASC) 2.5 MG tablet Take 2.5 mg by mouth daily.   buPROPion (WELLBUTRIN XL) 300 MG 24 hr tablet Take 300 mg by mouth daily.   Cholecalciferol (VITAMIN D3) 125 MCG (5000 UT) CAPS Take 1 capsule by mouth daily.    clobetasol ointment (TEMOVATE) 0.05 % SMARTSIG:1 Topical Every Night   clotrimazole-betamethasone (LOTRISONE) cream Apply topically.   colchicine 0.6 MG tablet Take 0.6 mg by mouth daily.   Cyanocobalamin (VITAMIN B-12) 5000 MCG TBDP Take 1,000 mcg by mouth daily.    cyclophosphamide (CYTOXAN) 25 MG capsule TAKE 3 CAPSULES BY MOUTH DAILY   diclofenac sodium (VOLTAREN) 1 % GEL  Apply 2-4 g topically 4 (four) times daily. (Patient taking differently: Apply 2-4 g topically as needed.)   Docusate Sodium (DSS) 100 MG CAPS Take by mouth.   fluticasone (FLONASE) 50 MCG/ACT nasal spray Place 2 sprays into both nostrils daily as needed for allergies or rhinitis.   furosemide (LASIX) 80 MG tablet as needed.    guaiFENesin (MUCINEX) 600 MG 12 hr tablet Take 1 tablet (600 mg total) by mouth 2 (two) times daily. (Patient taking differently: Take 600 mg by mouth 2 (two) times daily as needed.)   LINZESS 290 MCG CAPS capsule TAKE 1 CAPSULE BY MOUTH DAILY BEFORE BREAKFAST.   losartan (COZAAR) 25 MG tablet Take 12.5 mg by mouth at bedtime.   metoprolol succinate (TOPROL-XL) 50 MG 24 hr tablet Take 50 mg by mouth daily. Take with or immediately following a meal.   montelukast (SINGULAIR) 10 MG tablet Take 1 tablet (10 mg total) by mouth every morning.   nystatin (MYCOSTATIN) 100000 UNIT/ML suspension Take 5 mLs by mouth 2 (two) times daily as needed (for thrush).    ondansetron (ZOFRAN) 4 MG tablet Take 4 mg by mouth as needed for nausea or vomiting.    pantoprazole (PROTONIX) 40 MG tablet TAKE 1 TABLET BY MOUTH 1 OR 2 TIMES DAILY BEFORE A MEAL FOR REFLUX  polyethylene glycol (MIRALAX / GLYCOLAX) packet Take 17 g by mouth daily as needed for mild constipation or moderate constipation.    raloxifene (EVISTA) 60 MG tablet Take 60 mg by mouth daily.   rOPINIRole (REQUIP) 0.25 MG tablet Take 0.75 mg by mouth daily as needed (for rls).    rosuvastatin (CRESTOR) 10 MG tablet Take 10 mg by mouth at bedtime.   Vitamin D, Ergocalciferol, (DRISDOL) 1.25 MG (50000 UNIT) CAPS capsule Take 50,000 Units by mouth every 7 (seven) days.   No current facility-administered medications for this visit. (Other)      REVIEW OF SYSTEMS:    ALLERGIES Allergies  Allergen Reactions   Prednisone Other (See Comments)    Hallucinations    Tramadol Nausea Only   Aspirin Nausea And Vomiting   Celecoxib  Rash    PAST MEDICAL HISTORY Past Medical History:  Diagnosis Date   Acid reflux    Arthritis    CKD (chronic kidney disease)    Constipation    GERD (gastroesophageal reflux disease)    Macular retinoschisis of right eye 02/23/2021   Neuromuscular disorder (HCC)    PUD (peptic ulcer disease) 2017   gastric ulcer healed on repeat EGD in August 2017   Sinus drainage    Sleep apnea    had surgery to correct   Vitreomacular traction syndrome, with secondary hole formation 01/12/2021   Vitrectomy membrane peel gas injection right eye 05-27-2021   Past Surgical History:  Procedure Laterality Date   ANTERIOR CERVICAL DECOMP/DISCECTOMY FUSION N/A 11/19/2016   Procedure: ANTERIOR CERVICAL DISCECTOMY AND FUSION C5-6 WITH PLATES, SCREWS, CAGE, VIVIGEN II;  Surgeon: Jessy Oto, MD;  Location: [facility];  Service: Orthopedics;  Laterality: N/A;   CARPAL TUNNEL RELEASE     CATARACT EXTRACTION, BILATERAL Bilateral 2017   One in December 2017 and one in January 2018   COLONOSCOPY  02/15/2005   KPT:WSFKC small polyps ablated via cold biopsy, one from transverse colon and two from the rectum/Small external hemorrhoids   COLONOSCOPY  07/10/2010   LEX:NTZGYF rectum/long redundant colon, polyps in the sigmoid, descending, hepatic flexure/ADENOMATOUS POLYPS. next TCS due 06/2015   COLONOSCOPY  1995   3 polyps, path revealed chronic colitis   COLONOSCOPY N/A 08/05/2015   Procedure: COLONOSCOPY;  Surgeon: Daneil Dolin, MD; melanosis coli, colonic diverticulosis. Repeat colonoscopy in 5 years for surveillance.   COLONOSCOPY N/A 09/03/2020   Procedure: COLONOSCOPY;  Surgeon: Daneil Dolin, MD;  [facility];  Service: Endoscopy;  Laterality: N/A;  11:15am   ESOPHAGOGASTRODUODENOSCOPY  1995   Gastritis   ESOPHAGOGASTRODUODENOSCOPY N/A 04/16/2013   RMR: HH   ESOPHAGOGASTRODUODENOSCOPY N/A 05/13/2016   Procedure: ESOPHAGOGASTRODUODENOSCOPY (EGD);  Surgeon: Daneil Dolin, MD; gastric ulcer and  erosions s/p biopsy, erosive gastropathy. Path of reactive and regenerative changes, no H. pylori.   ESOPHAGOGASTRODUODENOSCOPY N/A 08/18/2016   Procedure: ESOPHAGOGASTRODUODENOSCOPY (EGD);  Surgeon: Daneil Dolin, MD; normal esophagus, previous gastric ulcer completely healed, small hiatal hernia, normal duodenum.   KNEE SURGERY     x2   POLYPECTOMY  09/03/2020   Procedure: POLYPECTOMY;  Surgeon: Daneil Dolin, MD;  [facility];  Service: Endoscopy;;   ROTATOR CUFF REPAIR     x2   SHOULDER ARTHROSCOPY WITH ROTATOR CUFF REPAIR AND SUBACROMIAL DECOMPRESSION Right 01/21/2015   Procedure: RIGHT SHOULDER ARTHROSCOPIC DEBRIDEMNT OF G-H JOINT AND REMOVAL OF LOOSE [place] SUBACROMIAL [place] OPEN RCT REPAIR WITH SUPPLEMENTAL [facility] PATCH;  Surgeon: Garald Balding, MD;  Location: [facility];  Service: Orthopedics;  Laterality: Right;   TONSILLECTOMY     TRIGGER FINGER RELEASE     UVULOPALATOPHARYNGOPLASTY      FAMILY HISTORY Family History  Problem Relation Age of Onset   Cancer Mother    Cancer Father    Diabetes Sister    Colon cancer Neg Hx    Liver disease Neg Hx     SOCIAL HISTORY Social History   Tobacco Use   Smoking status: Former    Packs/day: 0.10    Years: 48.00    Pack years: 4.80    Types: Cigarettes    Quit date: 05/11/2005    Years since quitting: 16.4   Smokeless tobacco: Never   Tobacco comments:    smoked less than 1 pack a week  Vaping Use   Vaping Use: Never used  Substance Use Topics   Alcohol use: No    Alcohol/week: 0.0 standard drinks   Drug use: No         OPHTHALMIC EXAM:  Base Eye Exam     Visual Acuity (ETDRS)       Right Left   Dist cc 20/30 -1 20/25 -2   Dist ph cc NI     Correction: Glasses         Tonometry (Tonopen, 2:04 PM)       Right Left   Pressure 10 12         Pupils       Pupils Dark Light Shape React APD   Right PERRL 3 2 Round Slow None   Left PERRL 3 2 Round Slow None          Visual Fields (Counting fingers)       Left Right    Full Full         Extraocular Movement       Right Left    Full, Ortho Full, Ortho         Neuro/Psych     Oriented x3: Yes   Mood/Affect: Normal         Dilation     Both eyes: 1.0% Mydriacyl, 2.5% Phenylephrine @ 2:04 PM           Slit Lamp and Fundus Exam     External Exam       Right Left   External Normal Normal         Slit Lamp Exam       Right Left   Lids/Lashes Normal Normal   Conjunctiva/Sclera White and quiet White and quiet   Cornea Clear Clear   Anterior Chamber Deep and quiet Deep and quiet   Iris Round and reactive Round and reactive   Lens Posterior chamber intraocular lens Posterior chamber intraocular lens   Anterior Vitreous Normal Normal         Fundus Exam       Right Left   Posterior Vitreous Clear, avitric,  ,, Posterior vitreous detachment   Disc Normal Normal   C/D Ratio 0.5 0.5   Macula Macular hole is closed Macular thickening, no subretinal fluid no CME   Vessels Normal Normal   Periphery Normal Normal            IMAGING AND PROCEDURES  Imaging and Procedures for 10/01/21           ASSESSMENT/PLAN:  Vitreomacular traction syndrome, with secondary hole formation This condition now resolved and improved post surgical correction, 4 months previous  Vitreomacular traction syndrome,  left Now spontaneous resolution and release of foveal traction with less extensive foveal macular schisis  Macular retinoschisis of right eye This condition resolved post vitrectomy  Macular retinoschisis of left eye OS improving foveal macular schisis as spontaneous vitreomacular traction syndrome has resolved into a PVD  Posterior vitreous detachment of left eye OS, recent VMT with tractional change on the macula for several months and is now spontaneously resolved.  Prior serous retinal detachment in the left eye resolved followed by foveal macular  schisis followed by release of VMT now to completion of PVD and recovery of acuity     ICD-10-CM   1. Macular hole of right eye  H35.341 OCT, Retina - OU - Both Eyes    2. Vitreomacular traction syndrome, with secondary hole formation  H43.821     3. Vitreomacular traction syndrome, left  H43.822     4. Macular retinoschisis of right eye  H33.101     5. Macular retinoschisis of left eye  H33.102     6. Posterior vitreous detachment of left eye  H43.812       1.  OD, macular hole repaired June 2022 with excellent visual acuity recovery.  She will macular traction syndrome of the right eye completely resolved  2.  OS now with complete vitreomacular traction syndrome spontaneous resolution.  Small amount of residual foveal macular schisis remains without visual acuity impact and no macular hole formation thus we will continue to observe the condition left eye.  3.  Ophthalmic Meds Ordered this visit:  No orders of the defined types were placed in this encounter.      Return in about 1 year (around 10/01/2022) for DILATE OU, COLOR FP, OCT.  There are no Patient Instructions on file for this visit.   Explained the diagnoses, plan, and follow up with the patient and they expressed understanding.  Patient expressed understanding of the importance of proper follow up care.   Clent Demark Zameer Borman M.D. Diseases & Surgery of the Retina and Vitreous Retina & Diabetic [place] 10/01/21     Abbreviations: M myopia (nearsighted); A astigmatism; H hyperopia (farsighted); P presbyopia; Mrx spectacle prescription;  CTL contact lenses; OD right eye; OS left eye; OU both eyes  XT exotropia; ET esotropia; PEK punctate epithelial keratitis; PEE punctate epithelial erosions; DES dry eye syndrome; MGD meibomian gland dysfunction; ATs artificial tears; PFAT's preservative free artificial tears; [place] nuclear sclerotic cataract; PSC posterior subcapsular cataract; ERM epi-retinal membrane; PVD posterior  vitreous detachment; RD retinal detachment; DM diabetes mellitus; DR diabetic retinopathy; NPDR non-proliferative diabetic retinopathy; PDR proliferative diabetic retinopathy; CSME clinically significant macular edema; DME diabetic macular edema; dbh dot blot hemorrhages; CWS cotton wool spot; POAG primary open angle glaucoma; C/D cup-to-disc ratio; HVF humphrey visual field; GVF goldmann visual field; OCT optical coherence tomography; IOP intraocular pressure; BRVO Branch retinal vein occlusion; CRVO central retinal vein occlusion; CRAO central retinal artery occlusion; BRAO branch retinal artery occlusion; RT retinal tear; SB scleral buckle; PPV pars plana vitrectomy; VH Vitreous hemorrhage; PRP panretinal laser photocoagulation; IVK intravitreal kenalog; VMT vitreomacular traction; MH Macular hole;  NVD neovascularization of the disc; NVE neovascularization elsewhere; AREDS age related eye disease study; ARMD age related macular degeneration; POAG primary open angle glaucoma; EBMD epithelial/anterior basement membrane dystrophy; ACIOL anterior chamber intraocular lens; IOL intraocular lens; PCIOL posterior chamber intraocular lens; Phaco/IOL phacoemulsification with intraocular lens placement; [place] photorefractive keratectomy; LASIK laser assisted in situ keratomileusis; HTN hypertension; DM diabetes mellitus; COPD chronic obstructive pulmonary disease

## 2021-10-01 NOTE — Assessment & Plan Note (Signed)
OS improving foveal macular schisis as spontaneous vitreomacular traction syndrome has resolved into a PVD

## 2021-10-08 ENCOUNTER — Other Ambulatory Visit: Payer: Self-pay

## 2021-10-08 ENCOUNTER — Encounter (HOSPITAL_COMMUNITY)
Admission: RE | Admit: 2021-10-08 | Discharge: 2021-10-08 | Disposition: A | Discharge status: Home / Self Care | Payer: Medicare Other | Source: Ambulatory Visit | Attending: Nephrology | Admitting: Nephrology

## 2021-10-08 VITALS — BP 133/81 | HR 74 | Temp 98.0°F | Resp 18

## 2021-10-08 DIAGNOSIS — N189 Chronic kidney disease, unspecified: Secondary | ICD-10-CM | POA: Diagnosis not present

## 2021-10-08 DIAGNOSIS — D631 Anemia in chronic kidney disease: Secondary | ICD-10-CM

## 2021-10-08 DIAGNOSIS — N184 Chronic kidney disease, stage 4 (severe): Secondary | ICD-10-CM | POA: Diagnosis not present

## 2021-10-08 LAB — POCT HEMOGLOBIN-HEMACUE: Hemoglobin: 11.4 g/dL — ABNORMAL LOW (ref 12.0–15.0)

## 2021-10-08 MED ORDER — EPOETIN ALFA-EPBX 10000 UNIT/ML IJ SOLN
INTRAMUSCULAR | Status: AC
  Administered 2021-10-08: 10000 [IU] via SUBCUTANEOUS
  Filled 2021-10-08: qty 1

## 2021-10-08 MED ORDER — EPOETIN ALFA-EPBX 10000 UNIT/ML IJ SOLN: 10000.0000 [IU] | INTRAMUSCULAR | Status: DC

## 2021-10-12 ENCOUNTER — Other Ambulatory Visit: Payer: Self-pay

## 2021-10-12 ENCOUNTER — Ambulatory Visit (HOSPITAL_BASED_OUTPATIENT_CLINIC_OR_DEPARTMENT_OTHER): Payer: Medicare Other | Attending: Pulmonary Disease | Admitting: Internal Medicine

## 2021-10-12 DIAGNOSIS — G4733 Obstructive sleep apnea (adult) (pediatric): Secondary | ICD-10-CM | POA: Insufficient documentation

## 2021-10-17 DIAGNOSIS — G4733 Obstructive sleep apnea (adult) (pediatric): Secondary | ICD-10-CM | POA: Diagnosis not present

## 2021-10-17 NOTE — Procedures (Signed)
     Patient Name: Cheryl [name], Cheryl [name] Date: 10/12/2021 Gender: Female D.O.B: [birth year] Age (years): 85 Referring Provider: Chi Rodman Pickle Height (inches): 52 Interpreting Physician: Baird Lyons MD, ABSM Weight (lbs): 198 RPSGT: Zadie Rhine BMI: 40 MRN: [id number] Neck Size: 14.50  CLINICAL INFORMATION The patient is referred for a CPAP titration to treat sleep apnea.  Date of NPSG, Split Night or HST:   HST 08/11/21   AHI 44.7/ hr, desaturation to 69%, body weight 201 lbs  SLEEP STUDY TECHNIQUE As per the AASM Manual for the Scoring of Sleep and Associated Events v2.3 (April 2016) with a hypopnea requiring 4% desaturations.  The channels recorded and monitored were frontal, central and occipital EEG, electrooculogram (EOG), submentalis EMG (chin), nasal and oral airflow, thoracic and abdominal wall motion, anterior tibialis EMG, snore microphone, electrocardiogram, and pulse oximetry. Continuous positive airway pressure (CPAP) was initiated at the beginning of the study and titrated to treat sleep-disordered breathing.  MEDICATIONS Medications self-administered by patient taken the night of the study : none reported  TECHNICIAN COMMENTS Comments added by technician: No restroom visted. Patient had difficulty initiating sleep. Comments added by scorer: N/A RESPIRATORY PARAMETERS Optimal PAP Pressure (cm): 15 AHI at Optimal Pressure (/hr): 0 Overall Minimal O2 (%): 87.0 Supine % at Optimal Pressure (%): 100 Minimal O2 at Optimal Pressure (%): 90.0   SLEEP ARCHITECTURE The study was initiated at 9:38:08 PM and ended at 4:18:58 AM.  Sleep onset time was 119.5 minutes and the sleep efficiency was 66.4%%. The total sleep time was 266 minutes.  The patient spent 4.7%% of the night in stage N1 sleep, 63.7%% in stage N2 sleep, 0.0%% in stage N3 and 31.6% in REM.Stage REM latency was 38.5 minutes  Wake after sleep onset was 15.4. Alpha intrusion was absent. Supine sleep was  52.07%.  CARDIAC DATA The 2 lead EKG demonstrated sinus rhythm. The mean heart rate was 66.9 beats per minute. Other EKG findings include: PVCs.  LEG MOVEMENT DATA The total Periodic Limb Movements of Sleep (PLMS) were 0. The PLMS index was 0.0. A PLMS index of <15 is considered normal in adults.  IMPRESSIONS - The optimal PAP pressure was 15 cm of water. - Mild oxygen desaturations were observed during this titration (min O2 = 87.0%). Minimum O2 saturation on CPAP 15 was 90%. - No snoring was audible during this study. - 2-lead EKG demonstrated: PVCs - Clinically significant periodic limb movements were not noted during this study. Arousals associated with PLMs were rare.  DIAGNOSIS - Obstructive Sleep Apnea (G47.33)  RECOMMENDATIONS - Trial of CPAP therapy on 15 cm H2O or autopap 10-20. - Patient used a Small size Resmed Full Face Mask AirFit 20 for Her mask and heated humidification. - Be careful with alcohol, sedatives and other CNS depressants that may worsen sleep apnea and disrupt normal sleep architecture. - Sleep hygiene should be reviewed to assess factors that may improve sleep quality. - Weight management and regular exercise should be initiated or continued.  [Electronically signed] 10/17/2021 03:54 PM  Baird Lyons MD, [place], American Board of Sleep Medicine   NPI: [id number]                        [place], [place] of Sleep Medicine  ELECTRONICALLY SIGNED ON:  10/17/2021, 3:50 PM [place] PH: [phone]   FX: [phone] [place]

## 2021-10-22 ENCOUNTER — Encounter (HOSPITAL_COMMUNITY)
Admission: RE | Admit: 2021-10-22 | Discharge: 2021-10-22 | Disposition: A | Discharge status: Home / Self Care | Payer: Medicare Other | Source: Ambulatory Visit | Attending: Nephrology | Admitting: Nephrology

## 2021-10-22 ENCOUNTER — Other Ambulatory Visit: Payer: Self-pay

## 2021-10-22 VITALS — BP 115/51 | HR 81 | Temp 97.6°F

## 2021-10-22 DIAGNOSIS — N189 Chronic kidney disease, unspecified: Secondary | ICD-10-CM | POA: Diagnosis not present

## 2021-10-22 DIAGNOSIS — D631 Anemia in chronic kidney disease: Secondary | ICD-10-CM | POA: Insufficient documentation

## 2021-10-22 LAB — IRON AND TIBC
Iron: 68 ug/dL (ref 28–170)
Saturation Ratios: 22 % (ref 10.4–31.8)
TIBC: 307 ug/dL (ref 250–450)
UIBC: 239 ug/dL

## 2021-10-22 LAB — FERRITIN: Ferritin: 387 ng/mL — ABNORMAL HIGH (ref 11–307)

## 2021-10-22 LAB — POCT HEMOGLOBIN-HEMACUE: Hemoglobin: 11 g/dL — ABNORMAL LOW (ref 12.0–15.0)

## 2021-10-22 MED ORDER — EPOETIN ALFA-EPBX 10000 UNIT/ML IJ SOLN
INTRAMUSCULAR | Status: AC
  Filled 2021-10-22: qty 1

## 2021-10-22 MED ORDER — EPOETIN ALFA-EPBX 10000 UNIT/ML IJ SOLN
10000.0000 [IU] | INTRAMUSCULAR | Status: DC
  Administered 2021-10-22: 10000 [IU] via SUBCUTANEOUS

## 2021-11-05 ENCOUNTER — Other Ambulatory Visit: Payer: Self-pay

## 2021-11-05 ENCOUNTER — Encounter (HOSPITAL_COMMUNITY): Payer: Medicare Other

## 2021-11-05 ENCOUNTER — Encounter (HOSPITAL_COMMUNITY)
Admission: RE | Admit: 2021-11-05 | Discharge: 2021-11-05 | Disposition: A | Discharge status: Home / Self Care | Payer: Medicare Other | Source: Ambulatory Visit | Attending: Nephrology | Admitting: Nephrology

## 2021-11-05 VITALS — BP 140/53 | HR 68 | Temp 97.1°F | Resp 18

## 2021-11-05 DIAGNOSIS — N189 Chronic kidney disease, unspecified: Secondary | ICD-10-CM | POA: Diagnosis not present

## 2021-11-05 LAB — POCT HEMOGLOBIN-HEMACUE: Hemoglobin: 11.7 g/dL — ABNORMAL LOW (ref 12.0–15.0)

## 2021-11-05 MED ORDER — EPOETIN ALFA-EPBX 10000 UNIT/ML IJ SOLN
INTRAMUSCULAR | Status: AC
  Filled 2021-11-05: qty 1

## 2021-11-05 MED ORDER — EPOETIN ALFA-EPBX 10000 UNIT/ML IJ SOLN
10000.0000 [IU] | INTRAMUSCULAR | Status: DC
  Administered 2021-11-05: 09:00:00 10000 [IU] via SUBCUTANEOUS

## 2021-11-19 ENCOUNTER — Other Ambulatory Visit: Payer: Self-pay

## 2021-11-19 ENCOUNTER — Encounter (HOSPITAL_COMMUNITY)
Admission: RE | Admit: 2021-11-19 | Discharge: 2021-11-19 | Disposition: A | Discharge status: Home / Self Care | Payer: Medicare Other | Source: Ambulatory Visit | Attending: Nephrology | Admitting: Nephrology

## 2021-11-19 VITALS — BP 143/78 | HR 68 | Temp 97.0°F | Resp 18

## 2021-11-19 DIAGNOSIS — D631 Anemia in chronic kidney disease: Secondary | ICD-10-CM | POA: Diagnosis present

## 2021-11-19 DIAGNOSIS — N189 Chronic kidney disease, unspecified: Secondary | ICD-10-CM

## 2021-11-19 DIAGNOSIS — N184 Chronic kidney disease, stage 4 (severe): Secondary | ICD-10-CM | POA: Insufficient documentation

## 2021-11-19 LAB — IRON AND TIBC
Iron: 89 ug/dL (ref 28–170)
Saturation Ratios: 23 % (ref 10.4–31.8)
TIBC: 395 ug/dL (ref 250–450)
UIBC: 306 ug/dL

## 2021-11-19 LAB — FERRITIN: Ferritin: 398 ng/mL — ABNORMAL HIGH (ref 11–307)

## 2021-11-19 LAB — POCT HEMOGLOBIN-HEMACUE: Hemoglobin: 12.4 g/dL (ref 12.0–15.0)

## 2021-11-19 MED ORDER — EPOETIN ALFA-EPBX 10000 UNIT/ML IJ SOLN
INTRAMUSCULAR | Status: AC
  Filled 2021-11-19: qty 1

## 2021-11-19 MED ORDER — EPOETIN ALFA-EPBX 10000 UNIT/ML IJ SOLN: 10000.0000 [IU] | INTRAMUSCULAR | Status: DC

## 2021-11-24 DIAGNOSIS — N184 Chronic kidney disease, stage 4 (severe): Secondary | ICD-10-CM | POA: Diagnosis not present

## 2021-11-25 DIAGNOSIS — E875 Hyperkalemia: Secondary | ICD-10-CM | POA: Diagnosis not present

## 2021-11-30 DIAGNOSIS — D631 Anemia in chronic kidney disease: Secondary | ICD-10-CM | POA: Diagnosis not present

## 2021-11-30 DIAGNOSIS — I120 Hypertensive chronic kidney disease with stage 5 chronic kidney disease or end stage renal disease: Secondary | ICD-10-CM | POA: Diagnosis not present

## 2021-11-30 DIAGNOSIS — N185 Chronic kidney disease, stage 5: Secondary | ICD-10-CM | POA: Diagnosis not present

## 2021-11-30 DIAGNOSIS — N058 Unspecified nephritic syndrome with other morphologic changes: Secondary | ICD-10-CM | POA: Diagnosis not present

## 2021-11-30 DIAGNOSIS — E785 Hyperlipidemia, unspecified: Secondary | ICD-10-CM | POA: Diagnosis not present

## 2021-11-30 DIAGNOSIS — D84821 Immunodeficiency due to drugs: Secondary | ICD-10-CM | POA: Diagnosis not present

## 2021-11-30 DIAGNOSIS — R942 Abnormal results of pulmonary function studies: Secondary | ICD-10-CM | POA: Diagnosis not present

## 2021-11-30 DIAGNOSIS — R768 Other specified abnormal immunological findings in serum: Secondary | ICD-10-CM | POA: Diagnosis not present

## 2021-11-30 DIAGNOSIS — N2581 Secondary hyperparathyroidism of renal origin: Secondary | ICD-10-CM | POA: Diagnosis not present

## 2021-11-30 DIAGNOSIS — K219 Gastro-esophageal reflux disease without esophagitis: Secondary | ICD-10-CM | POA: Diagnosis not present

## 2021-11-30 DIAGNOSIS — N057 Unspecified nephritic syndrome with diffuse crescentic glomerulonephritis: Secondary | ICD-10-CM | POA: Diagnosis not present

## 2021-11-30 DIAGNOSIS — M109 Gout, unspecified: Secondary | ICD-10-CM | POA: Diagnosis not present

## 2021-12-03 ENCOUNTER — Other Ambulatory Visit: Payer: Self-pay

## 2021-12-03 ENCOUNTER — Encounter (HOSPITAL_COMMUNITY)
Admission: RE | Admit: 2021-12-03 | Discharge: 2021-12-03 | Disposition: A | Discharge status: Home / Self Care | Payer: Medicare Other | Source: Ambulatory Visit | Attending: Nephrology | Admitting: Nephrology

## 2021-12-03 VITALS — BP 144/71 | HR 77 | Temp 97.1°F | Resp 18

## 2021-12-03 DIAGNOSIS — D631 Anemia in chronic kidney disease: Secondary | ICD-10-CM

## 2021-12-03 DIAGNOSIS — N189 Chronic kidney disease, unspecified: Secondary | ICD-10-CM | POA: Diagnosis not present

## 2021-12-03 DIAGNOSIS — N184 Chronic kidney disease, stage 4 (severe): Secondary | ICD-10-CM | POA: Diagnosis not present

## 2021-12-03 LAB — POCT HEMOGLOBIN-HEMACUE: Hemoglobin: 10.7 g/dL — ABNORMAL LOW (ref 12.0–15.0)

## 2021-12-03 MED ORDER — EPOETIN ALFA-EPBX 10000 UNIT/ML IJ SOLN
INTRAMUSCULAR | Status: AC
  Filled 2021-12-03: qty 1

## 2021-12-03 MED ORDER — EPOETIN ALFA-EPBX 10000 UNIT/ML IJ SOLN
10000.0000 [IU] | INTRAMUSCULAR | Status: DC
  Administered 2021-12-03: 10000 [IU] via SUBCUTANEOUS

## 2021-12-09 DIAGNOSIS — H35722 Serous detachment of retinal pigment epithelium, left eye: Secondary | ICD-10-CM | POA: Diagnosis not present

## 2021-12-09 DIAGNOSIS — H43821 Vitreomacular adhesion, right eye: Secondary | ICD-10-CM | POA: Diagnosis not present

## 2021-12-09 DIAGNOSIS — H3581 Retinal edema: Secondary | ICD-10-CM | POA: Diagnosis not present

## 2021-12-09 DIAGNOSIS — H40013 Open angle with borderline findings, low risk, bilateral: Secondary | ICD-10-CM | POA: Diagnosis not present

## 2021-12-09 DIAGNOSIS — Z961 Presence of intraocular lens: Secondary | ICD-10-CM | POA: Diagnosis not present

## 2021-12-10 ENCOUNTER — Other Ambulatory Visit: Payer: Self-pay | Admitting: Gastroenterology

## 2021-12-10 DIAGNOSIS — K219 Gastro-esophageal reflux disease without esophagitis: Secondary | ICD-10-CM

## 2021-12-10 DIAGNOSIS — R110 Nausea: Secondary | ICD-10-CM

## 2021-12-24 ENCOUNTER — Encounter (HOSPITAL_COMMUNITY): Payer: Self-pay

## 2021-12-31 ENCOUNTER — Encounter (HOSPITAL_COMMUNITY): Payer: Medicare Other

## 2021-12-31 ENCOUNTER — Encounter (HOSPITAL_COMMUNITY)
Admission: RE | Admit: 2021-12-31 | Discharge: 2021-12-31 | Disposition: A | Discharge status: Home / Self Care | Payer: Medicare PPO | Source: Ambulatory Visit | Attending: Nephrology | Admitting: Nephrology

## 2021-12-31 ENCOUNTER — Other Ambulatory Visit: Payer: Self-pay

## 2021-12-31 VITALS — BP 134/66 | HR 77 | Temp 97.4°F | Resp 20

## 2021-12-31 DIAGNOSIS — D631 Anemia in chronic kidney disease: Secondary | ICD-10-CM

## 2021-12-31 DIAGNOSIS — N189 Chronic kidney disease, unspecified: Secondary | ICD-10-CM | POA: Diagnosis present

## 2021-12-31 LAB — IRON AND TIBC
Iron: 87 ug/dL (ref 28–170)
Saturation Ratios: 27 % (ref 10.4–31.8)
TIBC: 322 ug/dL (ref 250–450)
UIBC: 235 ug/dL

## 2021-12-31 LAB — POCT HEMOGLOBIN-HEMACUE: Hemoglobin: 10.6 g/dL — ABNORMAL LOW (ref 12.0–15.0)

## 2021-12-31 LAB — FERRITIN: Ferritin: 435 ng/mL — ABNORMAL HIGH (ref 11–307)

## 2021-12-31 MED ORDER — EPOETIN ALFA-EPBX 10000 UNIT/ML IJ SOLN
INTRAMUSCULAR | Status: AC
  Administered 2021-12-31: 10000 [IU] via SUBCUTANEOUS
  Filled 2021-12-31: qty 1

## 2021-12-31 MED ORDER — EPOETIN ALFA-EPBX 10000 UNIT/ML IJ SOLN: 10000.0000 [IU] | INTRAMUSCULAR | Status: DC

## 2022-01-06 ENCOUNTER — Encounter: Payer: Self-pay | Admitting: Pulmonary Disease

## 2022-01-06 ENCOUNTER — Ambulatory Visit (INDEPENDENT_AMBULATORY_CARE_PROVIDER_SITE_OTHER): Payer: Medicare PPO | Admitting: Pulmonary Disease

## 2022-01-06 ENCOUNTER — Other Ambulatory Visit: Payer: Self-pay

## 2022-01-06 VITALS — BP 142/90 | HR 69 | Temp 98.0°F | Ht 59.0 in | Wt 209.0 lb

## 2022-01-06 DIAGNOSIS — J849 Interstitial pulmonary disease, unspecified: Secondary | ICD-10-CM | POA: Diagnosis not present

## 2022-01-06 DIAGNOSIS — G4733 Obstructive sleep apnea (adult) (pediatric): Secondary | ICD-10-CM | POA: Diagnosis not present

## 2022-01-06 DIAGNOSIS — J691 Pneumonitis due to inhalation of oils and essences: Secondary | ICD-10-CM

## 2022-01-06 DIAGNOSIS — J842 Lymphoid interstitial pneumonia: Secondary | ICD-10-CM | POA: Diagnosis not present

## 2022-01-06 LAB — PULMONARY FUNCTION TEST
DL/VA % pred: 85 %
DL/VA: 3.66 ml/min/mmHg/L
DLCO cor % pred: 55 %
DLCO cor: 8.91 ml/min/mmHg
DLCO unc % pred: 55 %
DLCO unc: 8.91 ml/min/mmHg
FEF 25-75 Post: 0.81 L/sec
FEF 25-75 Pre: 0.85 L/sec
FEF2575-%Change-Post: -4 %
FEF2575-%Pred-Post: 65 %
FEF2575-%Pred-Pre: 68 %
FEV1-%Change-Post: -4 %
FEV1-%Pred-Post: 77 %
FEV1-%Pred-Pre: 81 %
FEV1-Post: 1.03 L
FEV1-Pre: 1.08 L
FEV1FVC-%Change-Post: 7 %
FEV1FVC-%Pred-Pre: 95 %
FEV6-%Change-Post: -10 %
FEV6-%Pred-Post: 80 %
FEV6-%Pred-Pre: 89 %
FEV6-Post: 1.31 L
FEV6-Pre: 1.48 L
FEV6FVC-%Pred-Post: 105 %
FEV6FVC-%Pred-Pre: 105 %
FVC-%Change-Post: -10 %
FVC-%Pred-Post: 75 %
FVC-%Pred-Pre: 85 %
FVC-Post: 1.31 L
FVC-Pre: 1.48 L
Post FEV1/FVC ratio: 79 %
Post FEV6/FVC ratio: 100 %
Pre FEV1/FVC ratio: 73 %
Pre FEV6/FVC Ratio: 100 %
RV % pred: 72 %
RV: 1.45 L
TLC % pred: 69 %
TLC: 2.99 L

## 2022-01-06 MED ORDER — ALBUTEROL SULFATE HFA 108 (90 BASE) MCG/ACT IN AERS: 2.0000 | INHALATION_SPRAY | Freq: Four times a day (QID) | RESPIRATORY_TRACT | 2 refills | Status: AC | PRN

## 2022-01-06 NOTE — Patient Instructions (Addendum)
LIP/Interstitial lung disease - improved PFTs (FEV1 and DLCO increased) Labs were neg: HIV, immunoglobulins, RF, anti-CCP --Recommend serial monitoring with CT Chest without contrast and PFT in one year.  --ORDER CT chest in 05/2022 --CONTINUE Albuterol as needed for shortness of breath or wheezing --Will update your Nephrologist (Dr. Faylene Million)  Severe OSA --Will obtain final sleep titration reading to start CPAP --ORDER auto-CPAP 5-20 mm Hg and supplies --Discussed weight loss management for OSA and preparation for potential transplant  Follow-up with me in 3 months

## 2022-01-06 NOTE — Progress Notes (Signed)
Subjective:   PATIENT ID: Cheryl [name] GENDER: female DOB: 1946/08/31, MRN: [id number]   HPI  Chief Complaint  Patient presents with   Follow-up    PFT review  ILD   Reason for Visit: Follow-up  [name] is a 76 year old female former smoker (5 pack-years) with pauci immune necrotizing glomerulonephritis on cytoxan  and OSA who presents for follow-up.  Synopsis: Diagnosed with pauci immune necrotizing glomerulonephritis and started on cytoxan 04/2020. On evaluation for kidney transplant, PFTs were incidentally found with restrictive defect with reduced DLCO.  At baseline, she is active and able to perform her ADLs. She does have some shortness of breath with heavy exertion but does not feel this limits her activity. Denies shortness of breath, cough or wheezing when ambulating within house or when performing tasks including grocery shopping or yardwork unless it requires heavy lifting.  On prior visit she did report she has a history of sleep apnea s/p tonsillectomy >10 years ago. She was unable to tolerate CPAP. Currently she is snoring at night. Unsure if she has any apneic episodes. Reports decreased energy, excessive daytime sleepiness/drowsiness. Denies gasping or choking episodes at night but does awaken due to nocturia. Her sleep hygiene is poor and often watches TV into the night.  01/06/22 Since our last visit, she reports completing her sleep studies but she is not currently on CPAP. CPAP titration on 10/13/21 was documented to be completed but no report available. She has some dyspnea on heavy exertion but denies any symptoms of dyspnea, cough or wheezing at baseline when performing her ADLs. She is able to grocery shop an  Social History: Smoked 1/4 ppd since she was 57. Quit in 2006.  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 >30 years  Past Medical History:  Diagnosis Date   Acid reflux     Arthritis    CKD (chronic kidney disease)    Constipation    GERD (gastroesophageal reflux disease)    Macular retinoschisis of right eye 02/23/2021   Neuromuscular disorder (HCC)    PUD (peptic ulcer disease) 2017   gastric ulcer healed on repeat EGD in August 2017   Sinus drainage    Sleep apnea    had surgery to correct   Vitreomacular traction syndrome, with secondary hole formation 01/12/2021   Vitrectomy membrane peel gas injection right eye 05-27-2021    Allergies  Allergen Reactions   Prednisone Other (See Comments)    Hallucinations    Tramadol Nausea Only   Aspirin Nausea And Vomiting   Celecoxib Rash     Outpatient Medications Prior to Visit  Medication Sig Dispense Refill   acetaminophen (TYLENOL) 500 MG tablet Take 500-1,000 mg by mouth as needed (for pain/headache).      albuterol (VENTOLIN HFA) 108 (90 Base) MCG/ACT inhaler Inhale 2 puffs into the lungs every 6 (six) hours as needed for wheezing or shortness of breath. 18 g 2   amLODipine (NORVASC) 2.5 MG tablet Take 2.5 mg by mouth daily.     buPROPion (WELLBUTRIN XL) 300 MG 24 hr tablet Take 300 mg by mouth daily.     Cholecalciferol (VITAMIN D3) 125 MCG (5000 UT) CAPS Take 1 capsule by mouth daily.      clobetasol ointment (TEMOVATE) 0.05 % SMARTSIG:1 Topical Every Night     clotrimazole-betamethasone (LOTRISONE) cream Apply topically.     colchicine 0.6 MG tablet Take 0.6 mg  by mouth daily.     Cyanocobalamin (VITAMIN B-12) 5000 MCG TBDP Take 1,000 mcg by mouth daily.      diclofenac sodium (VOLTAREN) 1 % GEL Apply 2-4 g topically 4 (four) times daily. (Patient taking differently: Apply 2-4 g topically as needed.) 5 Tube 3   Docusate Sodium (DSS) 100 MG CAPS Take by mouth.     fluticasone (FLONASE) 50 MCG/ACT nasal spray Place 2 sprays into both nostrils daily as needed for allergies or rhinitis.     furosemide (LASIX) 80 MG tablet as needed.      guaiFENesin (MUCINEX) 600 MG 12 hr tablet Take 1 tablet (600 mg  total) by mouth 2 (two) times daily. (Patient taking differently: Take 600 mg by mouth 2 (two) times daily as needed.) 20 tablet 0   LINZESS 290 MCG CAPS capsule TAKE 1 CAPSULE BY MOUTH DAILY BEFORE BREAKFAST. 90 capsule 3   losartan (COZAAR) 25 MG tablet Take 12.5 mg by mouth at bedtime.     metoprolol succinate (TOPROL-XL) 50 MG 24 hr tablet Take 50 mg by mouth daily. Take with or immediately following a meal.     montelukast (SINGULAIR) 10 MG tablet Take 1 tablet (10 mg total) by mouth every morning. 30 tablet 2   nystatin (MYCOSTATIN) 100000 UNIT/ML suspension Take 5 mLs by mouth 2 (two) times daily as needed (for thrush).      ondansetron (ZOFRAN) 4 MG tablet Take 4 mg by mouth as needed for nausea or vomiting.      pantoprazole (PROTONIX) 40 MG tablet TAKE 1 TABLET BY MOUTH 1 OR 2 TIMES DAILY BEFORE A MEAL FOR REFLUX 180 tablet 1   polyethylene glycol (MIRALAX / GLYCOLAX) packet Take 17 g by mouth daily as needed for mild constipation or moderate constipation.      raloxifene (EVISTA) 60 MG tablet Take 60 mg by mouth daily.     rOPINIRole (REQUIP) 0.25 MG tablet Take 0.75 mg by mouth daily as needed (for rls).      rosuvastatin (CRESTOR) 10 MG tablet Take 10 mg by mouth at bedtime.     Vitamin D, Ergocalciferol, (DRISDOL) 1.25 MG (50000 UNIT) CAPS capsule Take 50,000 Units by mouth every 7 (seven) days.     No facility-administered medications prior to visit.    Review of Systems  Constitutional:  Negative for chills, diaphoresis, fever, malaise/fatigue and weight loss.  HENT:  Negative for congestion.   Respiratory:  Negative for cough, hemoptysis, sputum production, shortness of breath and wheezing.   Cardiovascular:  Negative for chest pain, palpitations and leg swelling.    Objective:   Vitals:   01/06/22 1116  BP: (!) 142/90  Pulse: 69  Temp: 98 F (36.7 C)  TempSrc: Oral  SpO2: 100%  Weight: 94.8 kg  Height: 4\' 11"  (1.499 m)      Body mass index is 42.21  kg/m.  Physical Exam: General: Well-appearing, no acute distress HENT: [place], AT Eyes: EOMI, no scleral icterus Respiratory: Clear to auscultation bilaterally.  No crackles, wheezing or rales Cardiovascular: RRR, -M/R/G, no JVD Extremities:-Edema,-tenderness Neuro: AAO x4, CNII-XII grossly intact Psych: Normal mood, normal affect  Data Reviewed:  Imaging: CT A/P Lung fields 02/19/12 Bibasilar atelectasis. Scattered small blebs CXR 04/19/20 - Stable bilateral pulmonary opacities, linear scarring in lingular CT Chest 05/23/21 - Scattered thin walled cysts bilaterally, minimal bibasilar fibrosis. Background emphysema  PFT: 05/11/21 FVC  1.49 (53%) FEV1 1.36 (63%) Ratio 91  TLC 60% DLCO 41% Interpretation: Moderate restrictive defect  with moderately reduced diffusing capacity  01/06/22 FVC 1.31 (75%) FEV1 1.03 (77%) Ratio 73  TLC 69% DLCO 55% Interpretation: Mild restrictive defect with moderately reduced diffusing capacity, improved values  Ambulatory O2 05/11/21 No desaturations No indication for supplemental oxygen  Sleep study: 08/11/21- AHI 44/hr. SpO2 nadir 69%  Assessment & Plan:   Discussion: 76 year old female former smoker (5 pack-years) with pauci immune necrotizing glomerulonephritis who presents for follow-up.   We re-reviewed her diagnosis of LIP. 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 Patient is already on immunosuppressants that would be appropriate for treatment of this disease. Lung biopsy could be pursued if needed for definitive diagnosis but will defer for now as this does not change current management. If this is indeed LIP, current data reports five year survival is 50-66% and median survival reported 11.5 years. We will plan for serial PFTs and CT chest to monitor for stability of her lung disease. Reviewed her PFTs today and restrictive defect has improved from moderate to mild on  FEV1.  Her restrictive defect at this point may be combination of LIP and morbid obesity. She expresses interest in kidney transplant. From a pulmonary standpoint, her lung function is stable/improved on current immunosuppressant therapy however she will need to continue discussions with her nephrologist regarding eligibility for transplant.  LIP/Interstitial lung disease - improved PFTs (FEV1 and DLCO increased) Labs were neg: HIV, immunoglobulins, RF, anti-CCP --Recommend serial monitoring with CT Chest without contrast and PFT in one year.  --ORDER CT chest in 05/2022 --Plan for another PFT in 6 months to monitor for stability --CONTINUE Albuterol as needed for shortness of breath or wheezing --Will update your Nephrologist (Dr. Faylene Million)  Severe OSA --Will obtain final sleep titration reading to verify CPAP parameters. Assigned sleep specialist was contacted and made aware of pending report --ORDER auto-CPAP 5-20 mm Hg and supplies --Discussed weight loss management for OSA and preparation for potential transplant  Health Maintenance Immunization History  Administered Date(s) Administered   Influenza, High Dose Seasonal PF 09/19/2017, 10/02/2018, 10/16/2019   Moderna Sars-Covid-2 Vaccination 02/01/2020, 10/13/2020, 12/29/2020   Pneumococcal Conjugate-13 10/02/2017   Zoster Recombinat (Shingrix) 10/02/2018, 01/11/2019   CT Lung Screen - not indicated  Orders Placed This Encounter  Procedures   Ambulatory Referral for DME    Referral Priority:   Routine    Referral Type:   Durable Medical Equipment Purchase    Number of Visits Requested:   1   Meds ordered this encounter  Medications   albuterol (VENTOLIN HFA) 108 (90 Base) MCG/ACT inhaler    Sig: Inhale 2 puffs into the lungs every 6 (six) hours as needed for wheezing or shortness of breath.    Dispense:  18 g    Refill:  2   Return in about 3 months (around 04/06/2022).  I have spent a total time of 45-minutes on the  day of the appointment reviewing prior documentation, coordinating care and discussing medical diagnosis and plan with the patient/family. Past medical history, allergies, medications were reviewed. Pertinent imaging, labs and tests included in this note have been reviewed and interpreted independently by me.  [place], MD [facility] 01/06/2022 10:58 AM  Office Number [phone]

## 2022-01-06 NOTE — Patient Instructions (Signed)
Full PFT performed today. °

## 2022-01-06 NOTE — Progress Notes (Signed)
Full PFT performed today. °

## 2022-01-11 ENCOUNTER — Other Ambulatory Visit: Payer: Self-pay

## 2022-01-11 DIAGNOSIS — N179 Acute kidney failure, unspecified: Secondary | ICD-10-CM

## 2022-01-11 NOTE — Progress Notes (Signed)
[facility] [place]  ASSESSMENT / PLAN: Cheryl [name] is a 76 y.o. right handed female in need of permanent hemodialysis access. I reviewed options for dialysis in detail with the patient. I counseled the patient that dialysis access requires surveillance and periodic maintenance. Plan to proceed with left arteriovenous graft as soon as Dr. Royce Macadamia thinks it appropriate.   CHIEF COMPLAINT: deteriorating renal function  HISTORY OF PRESENT ILLNESS: Cheryl [name] is a 76 y.o. female referred to clinic for evaluation of dialysis access creation.  The patient has had slowly deteriorating renal function.  She is followed by Dr. Harrie Jeans.  She is relatively asymptomatic from a renal dysfunction.  She is right-handed.  She has never had a dialysis access surgery before.  She denies any history of pacemaker.  She denies any history of central venous catheterization such as port or catheter placement. Past Medical History:  Diagnosis Date   Acid reflux    Arthritis    CKD (chronic kidney disease)    Constipation    GERD (gastroesophageal reflux disease)    Macular retinoschisis of right eye 02/23/2021   Neuromuscular disorder (HCC)    PUD (peptic ulcer disease) 2017   gastric ulcer healed on repeat EGD in August 2017   Sinus drainage    Sleep apnea    had surgery to correct   Vitreomacular traction syndrome, with secondary hole formation 01/12/2021   Vitrectomy membrane peel gas injection right eye 05-27-2021    Past Surgical History:  Procedure Laterality Date   ANTERIOR CERVICAL DECOMP/DISCECTOMY FUSION N/A 11/19/2016   Procedure: ANTERIOR CERVICAL DISCECTOMY AND FUSION C5-6 WITH PLATES, SCREWS, CAGE, VIVIGEN II;  Surgeon: Jessy Oto, MD;  Location: [facility];  Service: Orthopedics;  Laterality: N/A;   CARPAL TUNNEL RELEASE     CATARACT EXTRACTION, BILATERAL Bilateral 2017   One in December 2017 and one in January 2018   COLONOSCOPY  02/15/2005   XHB:ZJIRC small polyps  ablated via cold biopsy, one from transverse colon and two from the rectum/Small external hemorrhoids   COLONOSCOPY  07/10/2010   VEL:FYBOFB rectum/long redundant colon, polyps in the sigmoid, descending, hepatic flexure/ADENOMATOUS POLYPS. next TCS due 06/2015   COLONOSCOPY  1995   3 polyps, path revealed chronic colitis   COLONOSCOPY N/A 08/05/2015   Procedure: COLONOSCOPY;  Surgeon: Daneil Dolin, MD; melanosis coli, colonic diverticulosis. Repeat colonoscopy in 5 years for surveillance.   COLONOSCOPY N/A 09/03/2020   Procedure: COLONOSCOPY;  Surgeon: Daneil Dolin, MD;  [facility];  Service: Endoscopy;  Laterality: N/A;  11:15am   ESOPHAGOGASTRODUODENOSCOPY  1995   Gastritis   ESOPHAGOGASTRODUODENOSCOPY N/A 04/16/2013   RMR: [facility]   ESOPHAGOGASTRODUODENOSCOPY N/A 05/13/2016   Procedure: ESOPHAGOGASTRODUODENOSCOPY (EGD);  Surgeon: Daneil Dolin, MD; gastric ulcer and erosions s/p biopsy, erosive gastropathy. Path of reactive and regenerative changes, no H. pylori.   ESOPHAGOGASTRODUODENOSCOPY N/A 08/18/2016   Procedure: ESOPHAGOGASTRODUODENOSCOPY (EGD);  Surgeon: Daneil Dolin, MD; normal esophagus, previous gastric ulcer completely healed, small hiatal hernia, normal duodenum.   KNEE SURGERY     x2   POLYPECTOMY  09/03/2020   Procedure: POLYPECTOMY;  Surgeon: Daneil Dolin, MD;  [facility];  Service: Endoscopy;;   ROTATOR CUFF REPAIR     x2   SHOULDER ARTHROSCOPY WITH ROTATOR CUFF REPAIR AND SUBACROMIAL DECOMPRESSION Right 01/21/2015   Procedure: RIGHT SHOULDER ARTHROSCOPIC DEBRIDEMNT OF G-H JOINT AND REMOVAL OF LOOSE BODIES,ARTHROSCOPIC SUBACROMIAL DECOMPRESSION,MINI OPEN RCT REPAIR WITH SUPPLEMENTAL  [facility] PATCH;  Surgeon: Garald Balding, MD;  Location: [facility];  Service: Orthopedics;  Laterality: Right;   TONSILLECTOMY     TRIGGER FINGER RELEASE     UVULOPALATOPHARYNGOPLASTY      Family History  Problem Relation Age of Onset   Cancer Mother    Cancer  Father    Diabetes Sister    Colon cancer Neg Hx    Liver disease Neg Hx     Social History   Socioeconomic History   Marital status: Married    Spouse name: Not on file   Number of children: 2   Years of education: Not on file   Highest education level: Not on file  Occupational History   Occupation: BUS DRIVER    Employer: [employer]  Tobacco Use   Smoking status: Former    Packs/day: 0.10    Years: 48.00    Pack years: 4.80    Types: Cigarettes    Quit date: 05/11/2005    Years since quitting: 16.6   Smokeless tobacco: Never   Tobacco comments:    smoked less than 1 pack a week  Vaping Use   Vaping Use: Never used  Substance and Sexual Activity   Alcohol use: No    Alcohol/week: 0.0 standard drinks   Drug use: No   Sexual activity: Not on file  Other Topics Concern   Not on file  Social History Narrative   Not on file   Social Determinants of Health   Financial Resource Strain: Not on file  Food Insecurity: Not on file  Transportation Needs: Not on file  Physical Activity: Not on file  Stress: Not on file  Social Connections: Not on file  Intimate Partner Violence: Not on file    Allergies  Allergen Reactions   Prednisone Other (See Comments)    Hallucinations    Tramadol Nausea Only   Aspirin Nausea And Vomiting   Celecoxib Rash    Current Outpatient Medications  Medication Sig Dispense Refill   acetaminophen (TYLENOL) 500 MG tablet Take 500-1,000 mg by mouth as needed (for pain/headache).      albuterol (VENTOLIN HFA) 108 (90 Base) MCG/ACT inhaler Inhale 2 puffs into the lungs every 6 (six) hours as needed for wheezing or shortness of breath. 18 g 2   amLODipine (NORVASC) 2.5 MG tablet Take 2.5 mg by mouth daily.     buPROPion (WELLBUTRIN XL) 300 MG 24 hr tablet Take 300 mg by mouth daily.     Cholecalciferol (VITAMIN D3) 125 MCG (5000 UT) CAPS Take 1 capsule by mouth daily.     cholecalciferol (VITAMIN D3) 25 MCG (1000 UNIT) tablet 1  tablet     clobetasol ointment (TEMOVATE) 0.05 %      clotrimazole-betamethasone (LOTRISONE) cream Apply topically.     colchicine 0.6 MG tablet Take 0.6 mg by mouth daily.     Cyanocobalamin (VITAMIN B-12) 5000 MCG TBDP Take 1,000 mcg by mouth daily.      diclofenac sodium (VOLTAREN) 1 % GEL Apply 2-4 g topically 4 (four) times daily. 5 Tube 3   Docusate Sodium (DSS) 100 MG CAPS Take by mouth.     fluticasone (FLONASE) 50 MCG/ACT nasal spray Place 2 sprays into both nostrils daily as needed for allergies or rhinitis.     furosemide (LASIX) 80 MG tablet as needed.     guaiFENesin (MUCINEX) 600 MG 12 hr tablet Take 1 tablet (600 mg total) by mouth 2 (two) times daily. (Patient  taking differently: Take 600 mg by mouth 2 (two) times daily as needed.) 20 tablet 0   LINZESS 290 MCG CAPS capsule TAKE 1 CAPSULE BY MOUTH DAILY BEFORE BREAKFAST. 90 capsule 3   losartan (COZAAR) 25 MG tablet Take 12.5 mg by mouth at bedtime.     metoprolol succinate (TOPROL-XL) 50 MG 24 hr tablet Take 50 mg by mouth daily. Take with or immediately following a meal.     montelukast (SINGULAIR) 10 MG tablet Take 1 tablet (10 mg total) by mouth every morning. 30 tablet 2   nystatin (MYCOSTATIN) 100000 UNIT/ML suspension Take 5 mLs by mouth 2 (two) times daily as needed (for thrush).      ondansetron (ZOFRAN) 4 MG tablet Take 4 mg by mouth as needed for nausea or vomiting.     pantoprazole (PROTONIX) 40 MG tablet TAKE 1 TABLET BY MOUTH 1 OR 2 TIMES DAILY BEFORE A MEAL FOR REFLUX 180 tablet 1   polyethylene glycol (MIRALAX / GLYCOLAX) packet Take 17 g by mouth daily as needed for mild constipation or moderate constipation.     raloxifene (EVISTA) 60 MG tablet Take 60 mg by mouth daily.     rOPINIRole (REQUIP) 0.25 MG tablet Take 0.75 mg by mouth daily as needed (for rls).      rosuvastatin (CRESTOR) 10 MG tablet Take 10 mg by mouth at bedtime.     Vitamin D, Ergocalciferol, (DRISDOL) 1.25 MG (50000 UNIT) CAPS capsule Take  50,000 Units by mouth every 7 (seven) days.     No current facility-administered medications for this visit.    REVIEW OF SYSTEMS:  [X]  denotes positive finding, [ ]  denotes negative finding Cardiac  Comments:  Chest pain or chest pressure:    Shortness of breath upon exertion:    Short of breath when lying flat:    Irregular heart rhythm:        Vascular    Pain in calf, thigh, or hip brought on by ambulation:    Pain in feet at night that wakes you up from your sleep:     Blood clot in your veins:    Leg swelling:         Pulmonary    Oxygen at home:    Productive cough:     Wheezing:         Neurologic    Sudden weakness in arms or legs:     Sudden numbness in arms or legs:     Sudden onset of difficulty speaking or slurred speech:    Temporary loss of vision in one eye:     Problems with dizziness:         Gastrointestinal    Blood in stool:     Vomited blood:         Genitourinary    Burning when urinating:     Blood in urine:        Psychiatric    Major depression:         Hematologic    Bleeding problems:    Problems with blood clotting too easily:        Skin    Rashes or ulcers:        Constitutional    Fever or chills:      PHYSICAL EXAM Vitals:   01/12/22 1036  BP: (!) 142/88  Pulse: 67  Resp: 20  Temp: 98.3 F (36.8 C)  SpO2: 100%  Weight: 208 lb (94.3 kg)  Height: 4\' 11"  (1.499  m)    Constitutional: well appearing. Obese. No distress.   Neurologic: CN intact. no focal findings. no sensory loss. Psychiatric: Mood and affect symmetric and appropriate. Eyes: No icterus. No conjunctival pallor. Ears, nose, throat: mucous membranes moist. Midline trachea.  Cardiac: regular rate and rhythm.  Respiratory: unlabored. Abdominal:  soft, non-tender, non-distended.  Peripheral vascular: 2+ radial pulses Extremity: no edema. no cyanosis. no pallor.  Skin: no gangrene. no ulceration.  Lymphatic: no Stemmer's sign. no palpable  lymphadenopathy.  PERTINENT LABORATORY AND RADIOLOGIC DATA  Most recent CBC CBC Latest Ref Rng & Units 12/31/2021 12/03/2021 11/19/2021  WBC 4.0 - 10.5 K/uL - - -  Hemoglobin 12.0 - 15.0 g/dL 10.6(L) 10.7(L) 12.4  Hematocrit 36.0 - 46.0 % - - -  Platelets 150 - 400 K/uL - - -     Most recent CMP CMP Latest Ref Rng & Units 08/27/2020 08/27/2020 07/16/2020  Glucose 70 - 99 mg/dL 60(L) - 94  BUN 8 - 23 mg/dL 62(H) - 43(H)  Creatinine 0.44 - 1.00 mg/dL 3.55(H) - 3.09(H)  Sodium 135 - 145 mmol/L 143 - 145  Potassium 3.5 - 5.1 mmol/L 4.5 - 4.4  Chloride 98 - 111 mmol/L 110 - 107  CO2 22 - 32 mmol/L 23 - 26  Calcium 8.7 - 10.3 mg/dL 9.6 9.7 9.1  Total Protein 6.5 - 8.1 g/dL 6.5 - -  Total Bilirubin 0.3 - 1.2 mg/dL 0.4 - -  Alkaline Phos 38 - 126 U/L 70 - -  AST 15 - 41 U/L 19 - -  ALT 0 - 44 U/L 18 - -   Preoperative venous duplex shows no usable vein for autogenous dialysis access.  Yevonne Aline. Stanford Breed, MD [facility] Phone Number: [phone] 01/12/2022 11:10 AM  Total time spent on preparing this encounter including chart review, data review, collecting history, examining the patient, coordinating care for this new patient, 60 minutes.  Portions of this report may have been transcribed using voice recognition software.  Every effort has been made to ensure accuracy; however, inadvertent computerized transcription errors may still be present.

## 2022-01-12 ENCOUNTER — Ambulatory Visit (INDEPENDENT_AMBULATORY_CARE_PROVIDER_SITE_OTHER): Payer: Medicare PPO | Admitting: Vascular Surgery

## 2022-01-12 ENCOUNTER — Other Ambulatory Visit: Payer: Self-pay

## 2022-01-12 ENCOUNTER — Encounter: Payer: Self-pay | Admitting: Pulmonary Disease

## 2022-01-12 ENCOUNTER — Ambulatory Visit (HOSPITAL_COMMUNITY)
Admission: RE | Admit: 2022-01-12 | Discharge: 2022-01-12 | Disposition: A | Discharge status: Home / Self Care | Payer: Medicare PPO | Source: Ambulatory Visit | Attending: Vascular Surgery | Admitting: Vascular Surgery

## 2022-01-12 ENCOUNTER — Encounter: Payer: Self-pay | Admitting: Vascular Surgery

## 2022-01-12 ENCOUNTER — Ambulatory Visit (INDEPENDENT_AMBULATORY_CARE_PROVIDER_SITE_OTHER)
Admission: RE | Admit: 2022-01-12 | Discharge: 2022-01-12 | Disposition: A | Discharge status: Home / Self Care | Payer: Medicare PPO | Source: Ambulatory Visit | Attending: Vascular Surgery | Admitting: Vascular Surgery

## 2022-01-12 VITALS — BP 142/88 | HR 67 | Temp 98.3°F | Resp 20 | Ht 59.0 in | Wt 208.0 lb

## 2022-01-12 DIAGNOSIS — N185 Chronic kidney disease, stage 5: Secondary | ICD-10-CM | POA: Diagnosis not present

## 2022-01-12 DIAGNOSIS — N179 Acute kidney failure, unspecified: Secondary | ICD-10-CM

## 2022-01-25 ENCOUNTER — Other Ambulatory Visit (HOSPITAL_COMMUNITY): Payer: Self-pay | Admitting: *Deleted

## 2022-01-27 ENCOUNTER — Other Ambulatory Visit: Payer: Self-pay

## 2022-01-27 ENCOUNTER — Encounter (HOSPITAL_COMMUNITY): Payer: Self-pay

## 2022-01-27 ENCOUNTER — Encounter (HOSPITAL_COMMUNITY)
Admission: RE | Admit: 2022-01-27 | Discharge: 2022-01-27 | Disposition: A | Discharge status: Home / Self Care | Payer: Medicare Other | Source: Ambulatory Visit | Attending: Nephrology | Admitting: Nephrology

## 2022-01-27 VITALS — BP 143/80 | HR 75 | Temp 97.2°F | Resp 18 | Wt 200.0 lb

## 2022-01-27 DIAGNOSIS — N184 Chronic kidney disease, stage 4 (severe): Secondary | ICD-10-CM | POA: Insufficient documentation

## 2022-01-27 DIAGNOSIS — N057 Unspecified nephritic syndrome with diffuse crescentic glomerulonephritis: Secondary | ICD-10-CM | POA: Diagnosis present

## 2022-01-27 DIAGNOSIS — D631 Anemia in chronic kidney disease: Secondary | ICD-10-CM | POA: Diagnosis not present

## 2022-01-27 DIAGNOSIS — N189 Chronic kidney disease, unspecified: Secondary | ICD-10-CM | POA: Insufficient documentation

## 2022-01-27 LAB — POCT HEMOGLOBIN-HEMACUE: Hemoglobin: 11.1 g/dL — ABNORMAL LOW (ref 12.0–15.0)

## 2022-01-27 MED ORDER — SODIUM CHLORIDE 0.9 % IV SOLN
510.0000 mg | INTRAVENOUS | Status: DC
  Administered 2022-01-27: 510 mg via INTRAVENOUS
  Filled 2022-01-27: qty 510

## 2022-01-27 MED ORDER — DIPHENHYDRAMINE HCL 25 MG PO CAPS
ORAL_CAPSULE | ORAL | Status: AC
  Filled 2022-01-27: qty 1

## 2022-01-27 MED ORDER — EPOETIN ALFA-EPBX 10000 UNIT/ML IJ SOLN
INTRAMUSCULAR | Status: AC
  Filled 2022-01-27: qty 1

## 2022-01-27 MED ORDER — DIPHENHYDRAMINE HCL 25 MG PO CAPS
25.0000 mg | ORAL_CAPSULE | Freq: Once | ORAL | Status: AC
  Administered 2022-01-27: 25 mg via ORAL

## 2022-01-27 MED ORDER — ACETAMINOPHEN 325 MG PO TABS
ORAL_TABLET | ORAL | Status: AC
  Filled 2022-01-27: qty 2

## 2022-01-27 MED ORDER — SODIUM CHLORIDE 0.9 % IV SOLN
1000.0000 mg | Freq: Once | INTRAVENOUS | Status: AC
  Administered 2022-01-27: 1000 mg via INTRAVENOUS
  Filled 2022-01-27: qty 100

## 2022-01-27 MED ORDER — ACETAMINOPHEN 325 MG PO TABS
650.0000 mg | ORAL_TABLET | Freq: Once | ORAL | Status: AC
  Administered 2022-01-27: 650 mg via ORAL

## 2022-01-27 MED ORDER — EPOETIN ALFA-EPBX 10000 UNIT/ML IJ SOLN
10000.0000 [IU] | INTRAMUSCULAR | Status: DC
  Administered 2022-01-27: 10000 [IU] via SUBCUTANEOUS

## 2022-01-28 ENCOUNTER — Encounter (HOSPITAL_COMMUNITY): Payer: Medicare PPO

## 2022-02-03 ENCOUNTER — Encounter (HOSPITAL_COMMUNITY): Payer: Medicare PPO

## 2022-02-24 ENCOUNTER — Encounter (HOSPITAL_COMMUNITY)
Admission: RE | Admit: 2022-02-24 | Discharge: 2022-02-24 | Disposition: A | Discharge status: Home / Self Care | Payer: Medicare Other | Source: Ambulatory Visit | Attending: Nephrology | Admitting: Nephrology

## 2022-02-24 VITALS — BP 162/66 | HR 68 | Temp 97.1°F | Resp 18

## 2022-02-24 DIAGNOSIS — N184 Chronic kidney disease, stage 4 (severe): Secondary | ICD-10-CM | POA: Insufficient documentation

## 2022-02-24 DIAGNOSIS — D631 Anemia in chronic kidney disease: Secondary | ICD-10-CM

## 2022-02-24 DIAGNOSIS — N189 Chronic kidney disease, unspecified: Secondary | ICD-10-CM | POA: Diagnosis present

## 2022-02-24 LAB — IRON AND TIBC
Iron: 103 ug/dL (ref 28–170)
Saturation Ratios: 34 % — ABNORMAL HIGH (ref 10.4–31.8)
TIBC: 301 ug/dL (ref 250–450)
UIBC: 198 ug/dL

## 2022-02-24 LAB — FERRITIN: Ferritin: 640 ng/mL — ABNORMAL HIGH (ref 11–307)

## 2022-02-24 LAB — POCT HEMOGLOBIN-HEMACUE: Hemoglobin: 10.4 g/dL — ABNORMAL LOW (ref 12.0–15.0)

## 2022-02-24 MED ORDER — EPOETIN ALFA-EPBX 10000 UNIT/ML IJ SOLN
10000.0000 [IU] | INTRAMUSCULAR | Status: DC
  Administered 2022-02-24: 10000 [IU] via SUBCUTANEOUS

## 2022-02-24 MED ORDER — EPOETIN ALFA-EPBX 10000 UNIT/ML IJ SOLN
INTRAMUSCULAR | Status: AC
  Filled 2022-02-24: qty 1

## 2022-03-24 ENCOUNTER — Encounter (HOSPITAL_COMMUNITY)
Admission: RE | Admit: 2022-03-24 | Discharge: 2022-03-24 | Disposition: A | Discharge status: Home / Self Care | Payer: Medicare Other | Source: Ambulatory Visit | Attending: Nephrology | Admitting: Nephrology

## 2022-03-24 ENCOUNTER — Encounter (HOSPITAL_COMMUNITY): Payer: Medicare Other

## 2022-03-24 VITALS — BP 132/58 | HR 65 | Temp 97.0°F | Resp 18

## 2022-03-24 DIAGNOSIS — D631 Anemia in chronic kidney disease: Secondary | ICD-10-CM | POA: Diagnosis present

## 2022-03-24 DIAGNOSIS — N184 Chronic kidney disease, stage 4 (severe): Secondary | ICD-10-CM | POA: Diagnosis present

## 2022-03-24 DIAGNOSIS — N189 Chronic kidney disease, unspecified: Secondary | ICD-10-CM | POA: Insufficient documentation

## 2022-03-24 LAB — IRON AND TIBC
Iron: 87 ug/dL (ref 28–170)
Saturation Ratios: 28 % (ref 10.4–31.8)
TIBC: 314 ug/dL (ref 250–450)
UIBC: 227 ug/dL

## 2022-03-24 LAB — FERRITIN: Ferritin: 546 ng/mL — ABNORMAL HIGH (ref 11–307)

## 2022-03-24 LAB — POCT HEMOGLOBIN-HEMACUE: Hemoglobin: 10.7 g/dL — ABNORMAL LOW (ref 12.0–15.0)

## 2022-03-24 MED ORDER — EPOETIN ALFA-EPBX 10000 UNIT/ML IJ SOLN
10000.0000 [IU] | INTRAMUSCULAR | Status: DC
  Administered 2022-03-24: 10000 [IU] via SUBCUTANEOUS

## 2022-03-24 MED ORDER — EPOETIN ALFA-EPBX 10000 UNIT/ML IJ SOLN
INTRAMUSCULAR | Status: AC
  Filled 2022-03-24: qty 1

## 2022-04-04 NOTE — Progress Notes (Signed)
? ? ?Subjective:  ? ?PATIENT ID: Cheryl [name] GENDER: female DOB: 20-Feb-1946, MRN: [id number] ? ? ?HPI ? ?Chief Complaint  ?Patient presents with  ? Follow-up  ?  Cpap compliance   ? ?Reason for Visit: Follow-up ? ?[name] is a 76 year old female former smoker (5 pack-years) with pauci immune necrotizing glomerulonephritis on cytoxan  and OSA who presents for follow-up. ? ?Synopsis: ?Diagnosed with pauci immune necrotizing glomerulonephritis and started on cytoxan 04/2020. On evaluation for kidney transplant, PFTs were incidentally found with restrictive defect with reduced DLCO. ? ?At baseline, she is active and able to perform her ADLs. She does have some shortness of breath with heavy exertion but does not feel this limits her activity. Denies shortness of breath, cough or wheezing when ambulating within house or when performing tasks including grocery shopping or yardwork unless it requires heavy lifting. ? ?On prior visit she did report she has a history of sleep apnea s/p tonsillectomy >10 years ago. She was unable to tolerate CPAP. Currently she is snoring at night. Unsure if she has any apneic episodes. Reports decreased energy, excessive daytime sleepiness/drowsiness. Denies gasping or choking episodes at night but does awaken due to nocturia. Her sleep hygiene is poor and often watches TV into the night. ? ?01/06/22 ?Since our last visit, she reports completing her sleep studies but she is not currently on CPAP. CPAP titration on 10/13/21 was documented to be completed but no report available. She has some dyspnea on heavy exertion but denies any symptoms of dyspnea, cough or wheezing at baseline when performing her ADLs.  ? ?04/04/22 ?Since her last visit she has been to [facility] for pretransplant evaluation.  Note by Dr. Mancel Bale on 03/04/2022 reviewed.  Her main risk factor is her age, BMI, and ILD. She has also been counseled on options for dialysis access by her nephrologist but not yet on dialysis.  Rarely uses her albuterol once a month at most ? ?Social History: ?Smoked 1/4 ppd since she was 11. Quit in 2006. ? ?Environmental exposures:  ?Retired Teacher, early years/pre. ?Wood stove at home which was recently taken out in 2021. >30 years ? ?Past Medical History:  ?Diagnosis Date  ? Acid reflux   ? Arthritis   ? CKD (chronic kidney disease)   ? Constipation   ? GERD (gastroesophageal reflux disease)   ? Macular retinoschisis of right eye 02/23/2021  ? Neuromuscular disorder ([place])   ? PUD (peptic ulcer disease) 2017  ? gastric ulcer healed on repeat EGD in August 2017  ? Sinus drainage   ? Sleep apnea   ? had surgery to correct  ? Vitreomacular traction syndrome, with secondary hole formation 01/12/2021  ? Vitrectomy membrane peel gas injection right eye 05-27-2021  ?  ?Allergies  ?Allergen Reactions  ? Prednisone Other (See Comments)  ?  Hallucinations   ? Tramadol Nausea Only  ? Aspirin Nausea And Vomiting  ? Celecoxib Rash  ?  ? ?Outpatient Medications Prior to Visit  ?Medication Sig Dispense Refill  ? acetaminophen (TYLENOL) 500 MG tablet Take 500-1,000 mg by mouth as needed (for pain/headache).     ? albuterol (VENTOLIN HFA) 108 (90 Base) MCG/ACT inhaler Inhale 2 puffs into the lungs every 6 (six) hours as needed for wheezing or shortness of breath. 18 g 2  ? amLODipine (NORVASC) 2.5 MG tablet Take 2.5 mg by mouth daily.    ? buPROPion (WELLBUTRIN XL) 300 MG 24 hr tablet Take 300 mg by mouth  daily.    ? Cholecalciferol (VITAMIN D3) 125 MCG (5000 UT) CAPS Take 1 capsule by mouth daily.    ? cholecalciferol (VITAMIN D3) 25 MCG (1000 UNIT) tablet 1 tablet    ? clobetasol ointment (TEMOVATE) 0.05 %     ? clotrimazole-betamethasone (LOTRISONE) cream Apply topically.    ? colchicine 0.6 MG tablet Take 0.6 mg by mouth daily.    ? Cyanocobalamin (VITAMIN B-12) 5000 MCG TBDP Take 1,000 mcg by mouth daily.     ? diclofenac sodium (VOLTAREN) 1 % GEL Apply 2-4 g topically 4 (four) times daily. 5 Tube 3  ? Docusate Sodium (DSS)  100 MG CAPS Take by mouth.    ? fluticasone (FLONASE) 50 MCG/ACT nasal spray Place 2 sprays into both nostrils daily as needed for allergies or rhinitis.    ? furosemide (LASIX) 80 MG tablet as needed.    ? guaiFENesin (MUCINEX) 600 MG 12 hr tablet Take 1 tablet (600 mg total) by mouth 2 (two) times daily. (Patient taking differently: Take 600 mg by mouth 2 (two) times daily as needed.) 20 tablet 0  ? LINZESS 290 MCG CAPS capsule TAKE 1 CAPSULE BY MOUTH DAILY BEFORE BREAKFAST. 90 capsule 3  ? losartan (COZAAR) 25 MG tablet Take 12.5 mg by mouth at bedtime.    ? metoprolol succinate (TOPROL-XL) 50 MG 24 hr tablet Take 50 mg by mouth daily. Take with or immediately following a meal.    ? montelukast (SINGULAIR) 10 MG tablet Take 1 tablet (10 mg total) by mouth every morning. 30 tablet 2  ? nystatin (MYCOSTATIN) 100000 UNIT/ML suspension Take 5 mLs by mouth 2 (two) times daily as needed (for thrush).     ? ondansetron (ZOFRAN) 4 MG tablet Take 4 mg by mouth as needed for nausea or vomiting.    ? pantoprazole (PROTONIX) 40 MG tablet TAKE 1 TABLET BY MOUTH 1 OR 2 TIMES DAILY BEFORE A MEAL FOR REFLUX 180 tablet 1  ? polyethylene glycol (MIRALAX / GLYCOLAX) packet Take 17 g by mouth daily as needed for mild constipation or moderate constipation.    ? raloxifene (EVISTA) 60 MG tablet Take 60 mg by mouth daily.    ? rOPINIRole (REQUIP) 0.25 MG tablet Take 0.75 mg by mouth daily as needed (for rls).     ? rosuvastatin (CRESTOR) 10 MG tablet Take 10 mg by mouth at bedtime.    ? Vitamin D, Ergocalciferol, (DRISDOL) 1.25 MG (50000 UNIT) CAPS capsule Take 50,000 Units by mouth every 7 (seven) days.    ? ?No facility-administered medications prior to visit.  ? ? ?Review of Systems  ?Constitutional:  Negative for chills, diaphoresis, fever, malaise/fatigue and weight loss.  ?HENT:  Negative for congestion.   ?Respiratory:  Positive for shortness of breath. Negative for cough, hemoptysis, sputum production and wheezing.    ?Cardiovascular:  Negative for chest pain, palpitations and leg swelling.  ? ? ?Objective:  ? ?Vitals:  ? 04/07/22 0928  ?BP: 132/80  ?Pulse: 69  ?SpO2: 96%  ?Weight: 212 lb 12.8 oz (96.5 kg)  ?Height: '4\' 11"'$  (1.499 m)  ? ? ?SpO2: 96 % ?O2 Device: None (Room air) ? ?Body mass index is 42.98 kg/m?. ? ?Physical Exam: ?General: Well-appearing, no acute distress ?HENT: [place], AT ?Eyes: EOMI, no scleral icterus ?Respiratory: Clear to auscultation bilaterally.  No crackles, wheezing or rales ?Cardiovascular: RRR, -M/R/G, no JVD ?Extremities:-Edema,-tenderness ?Neuro: AAO x4, CNII-XII grossly intact ?Psych: Normal mood, normal affect ? ?Data Reviewed: ? ?Imaging: ?CT A/P  Lung fields 02/19/12 Bibasilar atelectasis. Scattered small blebs ?CXR 04/19/20 - Stable bilateral pulmonary opacities, linear scarring in lingular ?CT Chest 05/23/21 - Scattered thin walled cysts bilaterally, minimal bibasilar fibrosis. Background emphysema ? ?PFT: ?05/11/21 ?FVC  1.49 (53%) FEV1 1.36 (63%) Ratio 91  TLC 60% DLCO 41% ?Interpretation: Moderate restrictive defect with moderately reduced diffusing capacity ? ?01/06/22 ?FVC 1.31 (75%) FEV1 1.03 (77%) Ratio 73  TLC 69% DLCO 55% ?Interpretation: Mild restrictive defect with moderately reduced diffusing capacity, improved values ? ?Ambulatory O2 05/11/21 ?No desaturations ?No indication for supplemental oxygen ? ?Sleep study: ?08/11/21- AHI 44/hr. SpO2 nadir 69% ?PAP titration 10/12/21 - Trial of CPAP therapy on 15 cm H2O or autopap 10-20. ? ?CPAP Compliance 03/07/2022-04/05/2022 ?Usage days 30/30 (100%) ?>4 hours 25 days (83%) ?CPAP 5-20 AHI 2.5 ?Assessment & Plan:  ? ?Discussion: ?76 year old female former smoker (5 pack-years) with pauci immune necrotizing glomerulonephritis, stage V CKD who presents for follow-up. Counseled on LIP which is well-controlled. Counseled on CPAP compliance. She is not an [place] candidate. We discussed weight loss and oral devices for management of OSA however encouraged to  continue her CPAP in the interim since we are seeing excellent results with it. ? ?Previously lymphoid interstitial pneumonia (LIP) is a rare lung disease with a clinical course and prognosis is poorly understood. Fraser Din

## 2022-04-07 ENCOUNTER — Encounter (HOSPITAL_COMMUNITY): Payer: Self-pay

## 2022-04-07 ENCOUNTER — Encounter: Payer: Self-pay | Admitting: Pulmonary Disease

## 2022-04-07 ENCOUNTER — Ambulatory Visit (INDEPENDENT_AMBULATORY_CARE_PROVIDER_SITE_OTHER): Payer: Medicare Other | Admitting: Pulmonary Disease

## 2022-04-07 VITALS — BP 132/80 | HR 69 | Ht 59.0 in | Wt 212.8 lb

## 2022-04-07 DIAGNOSIS — J984 Other disorders of lung: Secondary | ICD-10-CM | POA: Diagnosis not present

## 2022-04-07 DIAGNOSIS — J691 Pneumonitis due to inhalation of oils and essences: Secondary | ICD-10-CM | POA: Diagnosis not present

## 2022-04-07 NOTE — Patient Instructions (Addendum)
LIP/Interstitial lung disease  ?--ORDER CT Chest without contrast July 2023 ?--Will plan for pulmonary function test in January 2024 ?--CONTINUE Albuterol as needed for shortness of breath or wheezing ? ?OSA ?Good job on your CPAP ?Avoid driving when sleepy ?Continue to work on weight loss for preparation for transplant ?Oral compliance dentists: Oneal Grout (orthodontist) or Augustina Mood, DDS  ? ?Follow-up with me in 3 months with CT prior to visit ? ? ?

## 2022-04-21 ENCOUNTER — Encounter (HOSPITAL_COMMUNITY): Payer: Medicare Other

## 2022-04-21 ENCOUNTER — Encounter (HOSPITAL_COMMUNITY)
Admission: RE | Admit: 2022-04-21 | Discharge: 2022-04-21 | Disposition: A | Discharge status: Home / Self Care | Payer: Medicare Other | Source: Ambulatory Visit | Attending: Nephrology | Admitting: Nephrology

## 2022-04-21 VITALS — BP 152/76 | HR 72 | Temp 97.3°F | Resp 18

## 2022-04-21 DIAGNOSIS — N189 Chronic kidney disease, unspecified: Secondary | ICD-10-CM | POA: Insufficient documentation

## 2022-04-21 DIAGNOSIS — N184 Chronic kidney disease, stage 4 (severe): Secondary | ICD-10-CM | POA: Insufficient documentation

## 2022-04-21 DIAGNOSIS — D631 Anemia in chronic kidney disease: Secondary | ICD-10-CM | POA: Insufficient documentation

## 2022-04-21 LAB — IRON AND TIBC
Iron: 74 ug/dL (ref 28–170)
Saturation Ratios: 24 % (ref 10.4–31.8)
TIBC: 314 ug/dL (ref 250–450)
UIBC: 240 ug/dL

## 2022-04-21 LAB — FERRITIN: Ferritin: 448 ng/mL — ABNORMAL HIGH (ref 11–307)

## 2022-04-21 LAB — POCT HEMOGLOBIN-HEMACUE: Hemoglobin: 10.9 g/dL — ABNORMAL LOW (ref 12.0–15.0)

## 2022-04-21 MED ORDER — EPOETIN ALFA-EPBX 10000 UNIT/ML IJ SOLN
INTRAMUSCULAR | Status: AC
  Administered 2022-04-21: 10000 [IU] via SUBCUTANEOUS
  Filled 2022-04-21: qty 1

## 2022-04-21 MED ORDER — EPOETIN ALFA-EPBX 10000 UNIT/ML IJ SOLN: 10000.0000 [IU] | INTRAMUSCULAR | Status: DC

## 2022-05-19 ENCOUNTER — Encounter (HOSPITAL_COMMUNITY)
Admission: RE | Admit: 2022-05-19 | Discharge: 2022-05-19 | Disposition: A | Discharge status: Home / Self Care | Payer: Medicare Other | Source: Ambulatory Visit | Attending: Nephrology | Admitting: Nephrology

## 2022-05-19 VITALS — BP 151/69 | HR 62 | Temp 97.2°F | Resp 18

## 2022-05-19 DIAGNOSIS — N184 Chronic kidney disease, stage 4 (severe): Secondary | ICD-10-CM | POA: Diagnosis not present

## 2022-05-19 DIAGNOSIS — N189 Chronic kidney disease, unspecified: Secondary | ICD-10-CM | POA: Diagnosis not present

## 2022-05-19 LAB — POCT HEMOGLOBIN-HEMACUE: Hemoglobin: 11.9 g/dL — ABNORMAL LOW (ref 12.0–15.0)

## 2022-05-19 MED ORDER — EPOETIN ALFA-EPBX 10000 UNIT/ML IJ SOLN: 10000.0000 [IU] | INTRAMUSCULAR | Status: DC

## 2022-05-19 MED ORDER — EPOETIN ALFA-EPBX 10000 UNIT/ML IJ SOLN
INTRAMUSCULAR | Status: AC
  Administered 2022-05-19: 10000 [IU] via SUBCUTANEOUS
  Filled 2022-05-19: qty 1

## 2022-06-10 ENCOUNTER — Other Ambulatory Visit: Payer: Self-pay

## 2022-06-10 ENCOUNTER — Emergency Department (HOSPITAL_COMMUNITY): Payer: Medicare Other

## 2022-06-10 ENCOUNTER — Emergency Department (HOSPITAL_COMMUNITY)
Admission: EM | Admit: 2022-06-10 | Discharge: 2022-06-10 | Disposition: A | Discharge status: Home / Self Care | Payer: Medicare Other | Attending: Emergency Medicine | Admitting: Emergency Medicine

## 2022-06-10 ENCOUNTER — Encounter (HOSPITAL_COMMUNITY): Payer: Self-pay | Admitting: *Deleted

## 2022-06-10 DIAGNOSIS — K219 Gastro-esophageal reflux disease without esophagitis: Secondary | ICD-10-CM | POA: Diagnosis not present

## 2022-06-10 DIAGNOSIS — N189 Chronic kidney disease, unspecified: Secondary | ICD-10-CM | POA: Diagnosis not present

## 2022-06-10 DIAGNOSIS — R079 Chest pain, unspecified: Secondary | ICD-10-CM | POA: Diagnosis present

## 2022-06-10 DIAGNOSIS — F439 Reaction to severe stress, unspecified: Secondary | ICD-10-CM | POA: Diagnosis not present

## 2022-06-10 LAB — COMPREHENSIVE METABOLIC PANEL
ALT: 12 U/L (ref 0–44)
AST: 17 U/L (ref 15–41)
Albumin: 3.8 g/dL (ref 3.5–5.0)
Alkaline Phosphatase: 90 U/L (ref 38–126)
Anion gap: 7 (ref 5–15)
BUN: 37 mg/dL — ABNORMAL HIGH (ref 8–23)
CO2: 25 mmol/L (ref 22–32)
Calcium: 8.9 mg/dL (ref 8.9–10.3)
Chloride: 109 mmol/L (ref 98–111)
Creatinine, Ser: 3.55 mg/dL — ABNORMAL HIGH (ref 0.44–1.00)
GFR, Estimated: 13 mL/min — ABNORMAL LOW (ref >60)
Glucose, Bld: 108 mg/dL — ABNORMAL HIGH (ref 70–99)
Potassium: 5.1 mmol/L (ref 3.5–5.1)
Sodium: 141 mmol/L (ref 135–145)
Total Bilirubin: 0.3 mg/dL (ref 0.3–1.2)
Total Protein: 7.5 g/dL (ref 6.5–8.1)

## 2022-06-10 LAB — TROPONIN I (HIGH SENSITIVITY)
Troponin I (High Sensitivity): 8 ng/L (ref <18)
Troponin I (High Sensitivity): 8 ng/L (ref <18)

## 2022-06-10 LAB — CBC WITH DIFFERENTIAL/PLATELET
Abs Immature Granulocytes: 0.04 10*3/uL (ref 0.00–0.07)
Basophils Absolute: 0 10*3/uL (ref 0.0–0.1)
Basophils Relative: 1 %
Eosinophils Absolute: 0.2 10*3/uL (ref 0.0–0.5)
Eosinophils Relative: 3 %
HCT: 37 % (ref 36.0–46.0)
Hemoglobin: 11.4 g/dL — ABNORMAL LOW (ref 12.0–15.0)
Immature Granulocytes: 1 %
Lymphocytes Relative: 12 %
Lymphs Abs: 0.8 10*3/uL (ref 0.7–4.0)
MCH: 32.1 pg (ref 26.0–34.0)
MCHC: 30.8 g/dL (ref 30.0–36.0)
MCV: 104.2 fL — ABNORMAL HIGH (ref 80.0–100.0)
Monocytes Absolute: 0.8 10*3/uL (ref 0.1–1.0)
Monocytes Relative: 12 %
Neutro Abs: 4.5 10*3/uL (ref 1.7–7.7)
Neutrophils Relative %: 71 %
Platelets: 402 10*3/uL — ABNORMAL HIGH (ref 150–400)
RBC: 3.55 MIL/uL — ABNORMAL LOW (ref 3.87–5.11)
RDW: 14.8 % (ref 11.5–15.5)
WBC: 6.2 10*3/uL (ref 4.0–10.5)
nRBC: 0 % (ref 0.0–0.2)

## 2022-06-10 NOTE — ED Triage Notes (Signed)
C/o chest heaviness, onset last night, central chest, no radiation, some light headedness and sob, CP comes and goes, denies fever, cough, congestion, cold sx, NVD, weakness or other sx. Alert, NAD, calm, interactive, resps e/u, speech clear. Took usual meds PTA, but not for sx.

## 2022-06-10 NOTE — ED Notes (Signed)
Pt gone to xray

## 2022-06-10 NOTE — Discharge Instructions (Addendum)
Please follow-up with your PCP within the next few days for reevaluation and continued medical management.  Keep taking your pantoprazole as discussed.  Further information regarding GERD and stress management has been provided for you to review at your leisure.  Return to the ED for new or worsening symptoms as described.

## 2022-06-10 NOTE — ED Provider Notes (Cosign Needed)
[facility] EMERGENCY DEPARTMENT Provider Note   CSN: [id number] Arrival date & time: 06/10/22  1233     History  Chief Complaint  Patient presents with   Chest Pain    Cheryl [name] is a 76 y.o. female with chief complaint of "chest pain" started last night suddenly when she was lying in bed.  Pain is described more as a pressure sitting on the central chest, without radiation.  States she tried some deep breathing exercises, which helped relieve this, then went to bed.  Woke up this morning and it had returned.  Also endorses intermittent periods of mild shortness of breath.  States when she was twisting her head in the ED, she believes she may have it too quick and caused herself to become mildly lightheaded, which quickly resolved.  She does not believe this is related.  Denies weakness of the extremities, palpitations, radiating pain, back pain, or significant cardiac history.  Denies changes in bowel or urinary habits.  States she felt this before when she had been diagnosed with GERD at least 25 years ago.  Also endorses increased stressors in her family life.  Hx of GERD, CKD, PUD, neuromuscular disorder, sleep apnea, restrictive lung disease, anemia due to chronic disease, Hashimoto's, fatty liver  The history is provided by the patient and medical records.  Chest Pain    Home Medications Prior to Admission medications   Medication Sig Start Date End Date Taking? Authorizing Provider  acetaminophen (TYLENOL) 500 MG tablet Take 500-1,000 mg by mouth as needed (for pain/headache).     [provider]  albuterol (VENTOLIN HFA) 108 (90 Base) MCG/ACT inhaler Inhale 2 puffs into the lungs every 6 (six) hours as needed for wheezing or shortness of breath. 01/06/22   Margaretha Seeds, MD  amLODipine (NORVASC) 2.5 MG tablet Take 2.5 mg by mouth daily. 04/27/21   [provider]  buPROPion (WELLBUTRIN XL) 300 MG 24 hr tablet Take 300 mg by mouth daily. 05/15/19   [provider]  Cholecalciferol (VITAMIN D3) 125 MCG (5000 UT) CAPS Take 1 capsule by mouth daily.    [provider]  cholecalciferol (VITAMIN D3) 25 MCG (1000 UNIT) tablet 1 tablet    [provider]  clobetasol ointment (TEMOVATE) 0.05 %  12/22/20   [provider]  clotrimazole-betamethasone (LOTRISONE) cream Apply topically.    [provider]  colchicine 0.6 MG tablet Take 0.6 mg by mouth daily.    [provider]  Cyanocobalamin (VITAMIN B-12) 5000 MCG TBDP Take 1,000 mcg by mouth daily.     [provider]  diclofenac sodium (VOLTAREN) 1 % GEL Apply 2-4 g topically 4 (four) times daily. 08/02/17   Cherylann Ratel, PA-C  Docusate Sodium (DSS) 100 MG CAPS Take by mouth.    [provider]  fluticasone (FLONASE) 50 MCG/ACT nasal spray Place 2 sprays into both nostrils daily as needed for allergies or rhinitis.    [provider]  furosemide (LASIX) 80 MG tablet as needed. 05/26/20   [provider]  guaiFENesin (MUCINEX) 600 MG 12 hr tablet Take 1 tablet (600 mg total) by mouth 2 (two) times daily. Patient taking differently: Take 600 mg by mouth 2 (two) times daily as needed. 03/29/20   Roxan Hockey, MD  LINZESS 290 MCG CAPS capsule TAKE 1 CAPSULE BY MOUTH DAILY BEFORE BREAKFAST. 10/29/20   Erenest Rasher, PA-C  losartan (COZAAR) 25 MG tablet Take 12.5 mg by mouth at bedtime.  [provider]  metoprolol succinate (TOPROL-XL) 50 MG 24 hr tablet Take 50 mg by mouth daily. Take with or immediately following a meal.    [provider]  montelukast (SINGULAIR) 10 MG tablet Take 1 tablet (10 mg total) by mouth every morning. 03/29/20   Roxan Hockey, MD  nystatin (MYCOSTATIN) 100000 UNIT/ML suspension Take 5 mLs by mouth 2 (two) times daily as needed (for thrush).  02/22/20   [provider]  ondansetron (ZOFRAN) 4 MG tablet Take 4 mg by mouth as needed for nausea or vomiting. 04/25/20    [provider]  pantoprazole (PROTONIX) 40 MG tablet TAKE 1 TABLET BY MOUTH 1 OR 2 TIMES DAILY BEFORE A MEAL FOR REFLUX 12/15/21   Annitta Needs, NP  polyethylene glycol [facility] / GLYCOLAX) packet Take 17 g by mouth daily as needed for mild constipation or moderate constipation.    [provider]  raloxifene (EVISTA) 60 MG tablet Take 60 mg by mouth daily.    [provider]  rOPINIRole (REQUIP) 0.25 MG tablet Take 0.75 mg by mouth daily as needed (for rls).  12/12/19   [provider]  rosuvastatin (CRESTOR) 10 MG tablet Take 10 mg by mouth at bedtime.    [provider]  Vitamin D, Ergocalciferol, (DRISDOL) 1.25 MG (50000 UNIT) CAPS capsule Take 50,000 Units by mouth every 7 (seven) days.    [provider]      Allergies    Prednisone, Tramadol, Aspirin, and Celecoxib    Review of Systems   Review of Systems  Cardiovascular:  Positive for chest pain.    Physical Exam Updated Vital Signs BP 125/77   Pulse 71   Temp 98.2 F (36.8 C) (Oral)   Resp 16   Ht '4\' 11"'$  (1.499 m)   Wt 95.3 kg   SpO2 97%   BMI 42.41 kg/m  Physical Exam Vitals and nursing note reviewed.  Constitutional:      General: She is not in acute distress.    Appearance: She is well-developed. She is obese. She is not ill-appearing, toxic-appearing or diaphoretic.  HENT:     Head: Normocephalic and atraumatic.  Eyes:     Conjunctiva/sclera: Conjunctivae normal.  Cardiovascular:     Rate and Rhythm: Normal rate and regular rhythm.     Pulses: Normal pulses.          Radial pulses are 2+ on the right side and 2+ on the left side.       Dorsalis pedis pulses are 2+ on the right side and 2+ on the left side.       Posterior tibial pulses are 2+ on the right side and 2+ on the left side.     Heart sounds: Normal heart sounds. No murmur heard. Pulmonary:     Effort: Pulmonary effort is normal. No tachypnea, accessory muscle usage or respiratory distress.      Breath sounds: Normal breath sounds. No wheezing.     Comments: CTAB, able to communicate without difficulty.  No increased respiratory effort appreciated. Chest:     Chest wall: No tenderness or crepitus.  Abdominal:     General: Bowel sounds are normal.     Palpations: Abdomen is soft.     Tenderness: There is no abdominal tenderness. There is no guarding.  Musculoskeletal:        General: No swelling.     Cervical back: Neck supple.     Right lower leg: No edema.  Left lower leg: No edema.  Skin:    General: Skin is warm and dry.     Capillary Refill: Capillary refill takes less than 2 seconds.     Coloration: Skin is not cyanotic or pale.     Findings: No erythema.  Neurological:     Mental Status: She is alert and oriented to person, place, and time.  Psychiatric:        Mood and Affect: Mood normal.     ED Results / Procedures / Treatments   Labs (all labs ordered are listed, but only abnormal results are displayed) Labs Reviewed  COMPREHENSIVE METABOLIC PANEL - Abnormal; Notable for the following components:      Result Value   Glucose, Bld 108 (*)    BUN 37 (*)    Creatinine, Ser 3.55 (*)    GFR, Estimated 13 (*)    All other components within normal limits  CBC WITH DIFFERENTIAL/PLATELET - Abnormal; Notable for the following components:   RBC 3.55 (*)    Hemoglobin 11.4 (*)    MCV 104.2 (*)    Platelets 402 (*)    All other components within normal limits  TROPONIN I (HIGH SENSITIVITY)  TROPONIN I (HIGH SENSITIVITY)    EKG EKG Interpretation  Date/Time:  Thursday June 10 2022 12:56:49 EDT Ventricular Rate:  70 PR Interval:  118 QRS Duration: 72 QT Interval:  398 QTC Calculation: 429 R Axis:   28 Text Interpretation: Normal sinus rhythm Normal ECG When compared with ECG of 29-Apr-2020 13:08, rate is slower Confirmed by Aletta Edouard [phone]) on 06/10/2022 1:00:13 PM  Radiology DG Chest 2 View  Result Date: 06/10/2022 CLINICAL DATA:  Chest  heaviness shortness of breath EXAM: CHEST - 2 VIEW COMPARISON:  Chest radiograph 04/29/2020 CT chest 05/23/2021 FINDINGS: The cardiomediastinal silhouette is stable, allowing for rightward patient rotation. There is no focal consolidation or pulmonary edema. There is no pleural effusion or pneumothorax. Linear opacities in the left mid lung are unchanged since 2021, likely reflecting scarring. There is no acute osseous abnormality. IMPRESSION: Stable chest with no radiographic evidence of acute cardiopulmonary process. Electronically Signed   By: Valetta Mole M.D.   On: 06/10/2022 13:52    Procedures Procedures    Medications Ordered in ED Medications - No data to display  ED Course/ Medical Decision Making/ A&P Clinical Course as of 06/10/22 2313  Thu Jun 10, 5966  2951 76 year old female with no prior cardiac history complaining of some chest pain that began last evening and recurred again today.  She said she had it once about 25 years ago and was reflux but she does not think it is that now because she takes reflux medication.  Otherwise well-appearing.  Getting labs EKG troponins.  Disposition per results of testing. [MB]    Clinical Course User Index [MB] Hayden Rasmussen, MD                           Medical Decision Making Amount and/or Complexity of Data Reviewed External Data Reviewed: notes. Labs: ordered. Decision-making details documented in ED Course. Radiology: ordered and independent interpretation performed. Decision-making details documented in ED Course. ECG/medicine tests: ordered and independent interpretation performed. Decision-making details documented in ED Course.  Risk OTC drugs. Prescription drug management.   76 y.o. female presents to the ED for concern of Chest Pain     This involves an extensive number of treatment options, and is a complaint  that carries with it a high risk of complications and morbidity.  The emergent differential diagnosis prior to  evaluation includes, but is not limited to: ACS, GERD, pneumonia, pneumothorax, dissection, costochondritis, PE, PUD, anxiety, rib fracture  This is not an exhaustive differential.   Past Medical History / Co-morbidities / Social History: Hx of GERD, CKD, PUD, neuromuscular disorder, sleep apnea, restrictive lung disease, lipoid interstitial pneumonia, anemia due to chronic disease, Hashimoto's, fatty liver  Additional History:  Internal and external records from outside source obtained and reviewed including Pulmonology, PCP  Physical Exam: Physical exam performed. The pertinent findings include: Overall unremarkable  Lab Tests: I ordered, and personally interpreted labs.  The pertinent results include:   CBC: Hgb 11.4, consistent with baseline CMP/BMP: Creatinine 3.55 and BUN 37, consistent with baseline Troponin: Initial 8, subsequent 8  Imaging Studies: I ordered imaging studies including CXR .  I independently visualized and interpreted said imaging.  Pertinent results include: Negative I agree with the radiologist interpretation.  Cardiac Monitoring: The patient was maintained on a cardiac monitor.  I personally viewed and interpreted the cardiac monitored which showed an underlying rhythm of: Normal sinus rhythm  I personally ordered and interpreted EKG 12 lead findings, which showed: Normal sinus rhythm  ED Course: Pt well-appearing on exam.  With chief complaint of chest pain that started last night, with waxing and waning shortness of breath.  Describes chest pain as pressure on the chest.  No radiation.  No aggravators or relievers.  Not worse with exertion.  96-97% on RA.  Hx of restrictive lung disease, GERD, PUD.  Admits to increased stress recently, and has felt this before with GERD symptoms about 25 years ago.  Patient does not meet SIRS or sepsis criteria.  Anticipate discharge pending results.  Upon reevaluation, patient remains mildly improved.  Chest pressure  lessened.  Troponins negative, EKG without significant changes, CXR negative for acute pathology.  Low suspicion for ACS, pneumonia, pulmonary embolism, pneumothorax, dissection.  Chest non-TTP, low suspicion for costochondritis or other MSK etiology.  Overall, I am uncertain the exact etiology of the patient's symptoms.  However, I do not believe she is currently experiencing a medical, surgical, or psychiatric emergency.  Suspicious of benign etiology such as GERD vs anxiety.  Recommend close follow-up with PCP for reevaluation and continued follow up with pulmonology as planned.  Patient satisfied with today's encounter.  Patient in NAD and in good condition at time of discharge.  Disposition: After consideration of the diagnostic results and the patient's encounter today, I feel that the emergency department workup does not suggest an emergent condition requiring admission or immediate intervention beyond what has been performed at this time.  The patient is safe for discharge and has been instructed to return immediately for worsening symptoms, change in symptoms or any other concerns.  I have reviewed the patients home medicines and have made adjustments as needed.  Discussed course of treatment thoroughly with the patient, whom demonstrated understanding.  Patient in agreement and has no further questions.    I discussed this case with my attending, Dr. Melina Copa, who agreed with the proposed treatment course and cosigned this note including patient's presenting symptoms, physical exam, and planned diagnostics and interventions.  Attending physician stated agreement with plan or made changes to plan which were implemented.     This chart was dictated using voice recognition software.  Despite best efforts to proofread, errors can occur which can change the documentation meaning.  Final Clinical Impression(s) / ED Diagnoses Final diagnoses:  Nonspecific chest pain  Gastroesophageal  reflux disease, unspecified whether esophagitis present  Stress at home    Rx / DC Orders ED Discharge Orders     None         Prince Rome, PA-C 45/14/60 2314

## 2022-06-14 ENCOUNTER — Ambulatory Visit (HOSPITAL_COMMUNITY)
Admission: RE | Admit: 2022-06-14 | Discharge: 2022-06-14 | Disposition: A | Discharge status: Home / Self Care | Payer: Medicare Other | Source: Ambulatory Visit | Attending: Nephrology | Admitting: Nephrology

## 2022-06-14 VITALS — BP 125/68 | HR 67 | Temp 97.1°F | Resp 20

## 2022-06-14 DIAGNOSIS — N189 Chronic kidney disease, unspecified: Secondary | ICD-10-CM | POA: Insufficient documentation

## 2022-06-14 DIAGNOSIS — D631 Anemia in chronic kidney disease: Secondary | ICD-10-CM | POA: Insufficient documentation

## 2022-06-14 DIAGNOSIS — N184 Chronic kidney disease, stage 4 (severe): Secondary | ICD-10-CM | POA: Insufficient documentation

## 2022-06-14 LAB — POCT HEMOGLOBIN-HEMACUE: Hemoglobin: 10.8 g/dL — ABNORMAL LOW (ref 12.0–15.0)

## 2022-06-14 LAB — FERRITIN: Ferritin: 550 ng/mL — ABNORMAL HIGH (ref 11–307)

## 2022-06-14 LAB — IRON AND TIBC
Iron: 92 ug/dL (ref 28–170)
Saturation Ratios: 31 % (ref 10.4–31.8)
TIBC: 297 ug/dL (ref 250–450)
UIBC: 205 ug/dL

## 2022-06-14 MED ORDER — EPOETIN ALFA-EPBX 10000 UNIT/ML IJ SOLN
10000.0000 [IU] | INTRAMUSCULAR | Status: DC
  Administered 2022-06-14: 10000 [IU] via SUBCUTANEOUS

## 2022-06-14 MED ORDER — EPOETIN ALFA-EPBX 10000 UNIT/ML IJ SOLN
INTRAMUSCULAR | Status: AC
  Filled 2022-06-14: qty 1

## 2022-06-16 ENCOUNTER — Encounter (HOSPITAL_COMMUNITY): Payer: Medicare Other

## 2022-06-23 ENCOUNTER — Ambulatory Visit
Admission: RE | Admit: 2022-06-23 | Discharge: 2022-06-23 | Disposition: A | Discharge status: Home / Self Care | Payer: Medicare Other | Source: Ambulatory Visit | Attending: Pulmonary Disease | Admitting: Pulmonary Disease

## 2022-06-23 DIAGNOSIS — J691 Pneumonitis due to inhalation of oils and essences: Secondary | ICD-10-CM

## 2022-06-30 ENCOUNTER — Ambulatory Visit (INDEPENDENT_AMBULATORY_CARE_PROVIDER_SITE_OTHER): Payer: Medicare Other | Admitting: Pulmonary Disease

## 2022-06-30 ENCOUNTER — Encounter: Payer: Self-pay | Admitting: Pulmonary Disease

## 2022-06-30 VITALS — BP 122/80 | HR 57 | Temp 97.8°F | Ht 59.0 in | Wt 214.8 lb

## 2022-06-30 DIAGNOSIS — G4733 Obstructive sleep apnea (adult) (pediatric): Secondary | ICD-10-CM | POA: Diagnosis not present

## 2022-06-30 DIAGNOSIS — J691 Pneumonitis due to inhalation of oils and essences: Secondary | ICD-10-CM

## 2022-06-30 NOTE — Patient Instructions (Addendum)
LIP/Interstitial lung disease - improved PFTs (FEV1 and DLCO increased) Labs were neg: HIV, immunoglobulins, RF, anti-CCP Stable radiographic findings >1 year --ORDER pulmonary function test in January 2024 --CONTINUE Albuterol as needed for shortness of breath or wheezing --Discuss with Dr. Royce Macadamia regarding immunosuppressant  Severe OSA --Reviewed CPAP compliance report --Patient uses NIV for more than four hours nightly for 70% of nights during the last three months of usage. --Continue auto-CPAP 5-20 mm Hg  --Discussed weight loss  Follow-up with me in 6 months (Jan 2024)

## 2022-06-30 NOTE — Progress Notes (Signed)
Subjective:   PATIENT ID: Cheryl [name] GENDER: female DOB: 08-15-1946, MRN: [id number]   HPI  Chief Complaint  Patient presents with   Follow-up    Patient had CT done on 7/5 and would like to go over the results. No other concerns.   Reason for Visit: Follow-up  [name] is a 76 year old female former smoker (5 pack-years) with pauci immune necrotizing glomerulonephritis on cytoxan  and OSA who presents for follow-up.  Synopsis: Diagnosed with pauci immune necrotizing glomerulonephritis and started on cytoxan 04/2020. On evaluation for kidney transplant, PFTs were incidentally found with restrictive defect with reduced DLCO.  At baseline, she is active and able to perform her ADLs. She does have some shortness of breath with heavy exertion but does not feel this limits her activity. Denies shortness of breath, cough or wheezing when ambulating within house or when performing tasks including grocery shopping or yardwork unless it requires heavy lifting.  On prior visit she did report she has a history of sleep apnea s/p tonsillectomy >10 years ago. She was unable to tolerate CPAP. Currently she is snoring at night. Unsure if she has any apneic episodes. Reports decreased energy, excessive daytime sleepiness/drowsiness. Denies gasping or choking episodes at night but does awaken due to nocturia. Her sleep hygiene is poor and often watches TV into the night.  01/06/22 Since our last visit, she reports completing her sleep studies but she is not currently on CPAP. CPAP titration on 10/13/21 was documented to be completed but no report available. She has some dyspnea on heavy exertion but denies any symptoms of dyspnea, cough or wheezing at baseline when performing her ADLs.   04/04/22 Since her last visit she has been to [facility] for pretransplant evaluation.  Note by Dr. Mancel Bale on 03/04/2022 reviewed.  Her main risk factor is her age, BMI, and ILD. She has also been counseled on options  for dialysis access by her nephrologist but not yet on dialysis. Rarely uses her albuterol once a month at most  04/02/22 Overall doing well. No respiratory symptoms. Denies shortness of breath, cough or wheezing.  Social History: Smoked 1/4 ppd since she was 30. Quit in 2006.  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 >30 years  Past Medical History:  Diagnosis Date   Acid reflux    Arthritis    CKD (chronic kidney disease)    Constipation    GERD (gastroesophageal reflux disease)    Macular retinoschisis of right eye 02/23/2021   Neuromuscular disorder (HCC)    PUD (peptic ulcer disease) 2017   gastric ulcer healed on repeat EGD in August 2017   Sinus drainage    Sleep apnea    had surgery to correct   Vitreomacular traction syndrome, with secondary hole formation 01/12/2021   Vitrectomy membrane peel gas injection right eye 05-27-2021    Allergies  Allergen Reactions   Prednisone Other (See Comments)    Hallucinations    Tramadol Nausea Only   Aspirin Nausea And Vomiting   Celecoxib Rash     Outpatient Medications Prior to Visit  Medication Sig Dispense Refill   acetaminophen (TYLENOL) 500 MG tablet Take 500-1,000 mg by mouth as needed (for pain/headache).      albuterol (VENTOLIN HFA) 108 (90 Base) MCG/ACT inhaler Inhale 2 puffs into the lungs every 6 (six) hours as needed for wheezing or shortness of breath. 18 g 2  amLODipine (NORVASC) 2.5 MG tablet Take 2.5 mg by mouth daily.     buPROPion (WELLBUTRIN XL) 300 MG 24 hr tablet Take 300 mg by mouth daily.     Cholecalciferol (VITAMIN D3) 125 MCG (5000 UT) CAPS Take 1 capsule by mouth daily.     cholecalciferol (VITAMIN D3) 25 MCG (1000 UNIT) tablet 1 tablet     clobetasol ointment (TEMOVATE) 0.05 %      clotrimazole-betamethasone (LOTRISONE) cream Apply topically.     colchicine 0.6 MG tablet Take 0.6 mg by mouth daily.     Cyanocobalamin (VITAMIN  B-12) 5000 MCG TBDP Take 1,000 mcg by mouth daily.      diclofenac sodium (VOLTAREN) 1 % GEL Apply 2-4 g topically 4 (four) times daily. 5 Tube 3   Docusate Sodium (DSS) 100 MG CAPS Take by mouth.     fluticasone (FLONASE) 50 MCG/ACT nasal spray Place 2 sprays into both nostrils daily as needed for allergies or rhinitis.     furosemide (LASIX) 80 MG tablet as needed.     guaiFENesin (MUCINEX) 600 MG 12 hr tablet Take 1 tablet (600 mg total) by mouth 2 (two) times daily. (Patient taking differently: Take 600 mg by mouth 2 (two) times daily as needed.) 20 tablet 0   LINZESS 290 MCG CAPS capsule TAKE 1 CAPSULE BY MOUTH DAILY BEFORE BREAKFAST. 90 capsule 3   losartan (COZAAR) 25 MG tablet Take 12.5 mg by mouth at bedtime.     metoprolol succinate (TOPROL-XL) 50 MG 24 hr tablet Take 50 mg by mouth daily. Take with or immediately following a meal.     montelukast (SINGULAIR) 10 MG tablet Take 1 tablet (10 mg total) by mouth every morning. 30 tablet 2   nystatin (MYCOSTATIN) 100000 UNIT/ML suspension Take 5 mLs by mouth 2 (two) times daily as needed (for thrush).      ondansetron (ZOFRAN) 4 MG tablet Take 4 mg by mouth as needed for nausea or vomiting.     pantoprazole (PROTONIX) 40 MG tablet TAKE 1 TABLET BY MOUTH 1 OR 2 TIMES DAILY BEFORE A MEAL FOR REFLUX 180 tablet 1   polyethylene glycol (MIRALAX / GLYCOLAX) packet Take 17 g by mouth daily as needed for mild constipation or moderate constipation.     raloxifene (EVISTA) 60 MG tablet Take 60 mg by mouth daily.     rOPINIRole (REQUIP) 0.25 MG tablet Take 0.75 mg by mouth daily as needed (for rls).      rosuvastatin (CRESTOR) 10 MG tablet Take 10 mg by mouth at bedtime.     Vitamin D, Ergocalciferol, (DRISDOL) 1.25 MG (50000 UNIT) CAPS capsule Take 50,000 Units by mouth every 7 (seven) days.     No facility-administered medications prior to visit.    Review of Systems  Constitutional:  Negative for chills, diaphoresis, fever, malaise/fatigue and  weight loss.  HENT:  Negative for congestion.   Respiratory:  Negative for cough, hemoptysis, sputum production, shortness of breath and wheezing.   Cardiovascular:  Negative for chest pain, palpitations and leg swelling.     Objective:   Vitals:   06/30/22 1056  BP: 122/80  Pulse: (!) 57  Temp: 97.8 F (36.6 C)  TempSrc: Oral  SpO2: 98%  Weight: 214 lb 12.8 oz (97.4 kg)  Height: '4\' 11"'$  (1.499 m)    SpO2: 98 % O2 Device: None (Room air)  Body mass index is 43.38 kg/m.  Physical Exam: General: Morbidly obese, well-appearing, no acute distress HENT: [place], AT Eyes:  EOMI, no scleral icterus Respiratory: Clear to auscultation bilaterally.  No crackles, wheezing or rales Cardiovascular: RRR, -M/R/G, no JVD Extremities:-Edema,-tenderness Neuro: AAO x4, CNII-XII grossly intact Psych: Normal mood, normal affect  Data Reviewed:  Imaging: CT A/P Lung fields 02/19/12 Bibasilar atelectasis. Scattered small blebs CXR 04/19/20 - Stable bilateral pulmonary opacities, linear scarring in lingular CT Chest 05/23/21 - Scattered thin walled cysts bilaterally, minimal bibasilar fibrosis. Background emphysema CT Chest 06/23/22 - Mild basilar predominant GGO and reticular opacities with scattered thin wall cysts. No progression  PFT: 05/11/21 FVC  1.49 (53%) FEV1 1.36 (63%) Ratio 91  TLC 60% DLCO 41% Interpretation: Moderate restrictive defect with moderately reduced diffusing capacity  01/06/22 FVC 1.31 (75%) FEV1 1.03 (77%) Ratio 73  TLC 69% DLCO 55% Interpretation: Mild restrictive defect with moderately reduced diffusing capacity, improved values  Ambulatory O2 05/11/21 No desaturations No indication for supplemental oxygen  Sleep study: 08/11/21- AHI 44/hr. SpO2 nadir 69% PAP titration 10/12/21 - Trial of CPAP therapy on 15 cm H2O or autopap 10-20.  CPAP Compliance 05/30/22-06/28/22 Usage days 30/30 (100%) >4 hours 25 days (83%) CPAP 5-20 AHI 1.9 Assessment & Plan:   Discussion: 76  year old female former smoker (5 pack-years) with LIP, pauci immune necrotizing glomerulonephritis, stage V CKD who presents for follow-up.  We reviewed CT which demonstrated no progression of LIP.   Previously discussed that lymphoid interstitial pneumonia (LIP) is a rare lung disease with a clinical course and prognosis that is poorly understood. Patient is already on immunosuppressants that would be appropriate for treatment of this disease. Lung biopsy could be pursued if needed for definitive diagnosis but will defer for now as this does not change current management. If this is indeed LIP, current data reports five year survival is 50-66% and median survival reported 11.5 years. We will plan for serial PFTs and CT chest to monitor for stability of her lung disease. Last PFTs with improvement from moderate to mild FEV1.  Her restrictive defect likely combination of LIP and morbid obesity. Lung function improved and radiographic findings stable on current immunosuppressant therapy.  LIP/Interstitial lung disease - improved PFTs (FEV1 and DLCO increased) Labs were neg: HIV, immunoglobulins, RF, anti-CCP Stable radiographic findings >1 year --ORDER pulmonary function test in January 2024 --CONTINUE Albuterol as needed for shortness of breath or wheezing --Discuss with Dr. Royce Macadamia regarding immunosuppressant  Severe OSA --Reviewed CPAP compliance report. Excellent control --Patient uses NIV for more than four hours nightly for 70% of nights during the last three months of usage. --Continue auto-CPAP 5-20 mm Hg  --Discussed weight loss  Health Maintenance Immunization History  Administered Date(s) Administered   Influenza, High Dose Seasonal PF 09/19/2017, 10/02/2018, 10/16/2019   Influenza-Unspecified 10/08/2021   Moderna Sars-Covid-2 Vaccination 02/01/2020, 10/13/2020, 12/29/2020   Pneumococcal Conjugate-13 10/02/2017   Zoster Recombinat (Shingrix) 10/02/2018, 01/11/2019   CT Lung  Screen - not indicated  Orders Placed This Encounter  Procedures   Pulmonary Function Test    Standing Status:   Future    Standing Expiration Date:   07/01/2023    Scheduling Instructions:     Jan 2024    Order Specific Question:   Where should this test be performed?    Answer:   [facility]    Order Specific Question:   Full PFT: includes the following: basic spirometry, spirometry pre & post bronchodilator, diffusion capacity (DLCO), lung volumes    Answer:   Full PFT   No orders of the defined types were placed  in this encounter.  Return in about 6 months (around 12/31/2022).  I have spent a total time of 35-minutes on the day of the appointment including chart review, data review, collecting history, coordinating care and discussing medical diagnosis and plan with the patient/family. Past medical history, allergies, medications were reviewed. Pertinent imaging, labs and tests included in this note have been reviewed and interpreted independently by me.  [place], MD [facility] 06/30/2022 11:05 AM  Office Number [phone]

## 2022-07-12 ENCOUNTER — Ambulatory Visit (HOSPITAL_COMMUNITY)
Admission: RE | Admit: 2022-07-12 | Discharge: 2022-07-12 | Disposition: A | Discharge status: Home / Self Care | Payer: Medicare Other | Source: Ambulatory Visit | Attending: Nephrology | Admitting: Nephrology

## 2022-07-12 VITALS — BP 145/68 | HR 64 | Temp 98.1°F | Resp 12

## 2022-07-12 DIAGNOSIS — N189 Chronic kidney disease, unspecified: Secondary | ICD-10-CM | POA: Diagnosis present

## 2022-07-12 DIAGNOSIS — D631 Anemia in chronic kidney disease: Secondary | ICD-10-CM | POA: Insufficient documentation

## 2022-07-12 DIAGNOSIS — N185 Chronic kidney disease, stage 5: Secondary | ICD-10-CM | POA: Diagnosis not present

## 2022-07-12 LAB — IRON AND TIBC
Iron: 82 ug/dL (ref 28–170)
Saturation Ratios: 29 % (ref 10.4–31.8)
TIBC: 279 ug/dL (ref 250–450)
UIBC: 197 ug/dL

## 2022-07-12 LAB — FERRITIN: Ferritin: 475 ng/mL — ABNORMAL HIGH (ref 11–307)

## 2022-07-12 LAB — POCT HEMOGLOBIN-HEMACUE: Hemoglobin: 11 g/dL — ABNORMAL LOW (ref 12.0–15.0)

## 2022-07-12 MED ORDER — EPOETIN ALFA 10000 UNIT/ML IJ SOLN: 10000.0000 [IU] | Freq: Once | INTRAMUSCULAR | Status: DC

## 2022-07-12 MED ORDER — EPOETIN ALFA 10000 UNIT/ML IJ SOLN
INTRAMUSCULAR | Status: AC
  Administered 2022-07-12: 10000 [IU]
  Filled 2022-07-12: qty 1

## 2022-07-12 MED ORDER — EPOETIN ALFA-EPBX 10000 UNIT/ML IJ SOLN: 10000.0000 [IU] | INTRAMUSCULAR | Status: DC

## 2022-07-27 ENCOUNTER — Other Ambulatory Visit (HOSPITAL_COMMUNITY): Payer: Self-pay

## 2022-07-28 ENCOUNTER — Ambulatory Visit (HOSPITAL_COMMUNITY)
Admission: RE | Admit: 2022-07-28 | Discharge: 2022-07-28 | Disposition: A | Discharge status: Home / Self Care | Payer: Medicare Other | Source: Ambulatory Visit | Attending: Nephrology | Admitting: Nephrology

## 2022-07-28 DIAGNOSIS — J849 Interstitial pulmonary disease, unspecified: Secondary | ICD-10-CM | POA: Diagnosis present

## 2022-07-28 DIAGNOSIS — N057 Unspecified nephritic syndrome with diffuse crescentic glomerulonephritis: Secondary | ICD-10-CM | POA: Insufficient documentation

## 2022-07-28 DIAGNOSIS — N185 Chronic kidney disease, stage 5: Secondary | ICD-10-CM | POA: Insufficient documentation

## 2022-07-28 MED ORDER — DIPHENHYDRAMINE HCL 25 MG PO CAPS
ORAL_CAPSULE | ORAL | Status: AC
  Administered 2022-07-28: 25 mg via ORAL
  Filled 2022-07-28: qty 1

## 2022-07-28 MED ORDER — DIPHENHYDRAMINE HCL 25 MG PO CAPS: 25.0000 mg | ORAL_CAPSULE | Freq: Once | ORAL | Status: AC

## 2022-07-28 MED ORDER — ACETAMINOPHEN 325 MG PO TABS: 650.0000 mg | ORAL_TABLET | Freq: Once | ORAL | Status: DC

## 2022-07-28 MED ORDER — SODIUM CHLORIDE 0.9 % IV SOLN
1000.0000 mg | Freq: Once | INTRAVENOUS | Status: AC
  Administered 2022-07-28: 1000 mg via INTRAVENOUS
  Filled 2022-07-28: qty 100

## 2022-08-09 ENCOUNTER — Encounter (HOSPITAL_COMMUNITY): Payer: Medicare Other

## 2022-08-11 ENCOUNTER — Ambulatory Visit (HOSPITAL_COMMUNITY)
Admission: RE | Admit: 2022-08-11 | Discharge: 2022-08-11 | Disposition: A | Discharge status: Home / Self Care | Payer: Medicare Other | Source: Ambulatory Visit | Attending: Nephrology | Admitting: Nephrology

## 2022-08-11 VITALS — BP 149/77 | HR 80 | Temp 98.0°F | Resp 18

## 2022-08-11 DIAGNOSIS — N189 Chronic kidney disease, unspecified: Secondary | ICD-10-CM | POA: Insufficient documentation

## 2022-08-11 DIAGNOSIS — N185 Chronic kidney disease, stage 5: Secondary | ICD-10-CM | POA: Diagnosis not present

## 2022-08-11 DIAGNOSIS — D631 Anemia in chronic kidney disease: Secondary | ICD-10-CM | POA: Diagnosis present

## 2022-08-11 LAB — IRON AND TIBC
Iron: 68 ug/dL (ref 28–170)
Saturation Ratios: 20 % (ref 10.4–31.8)
TIBC: 333 ug/dL (ref 250–450)
UIBC: 265 ug/dL

## 2022-08-11 LAB — FERRITIN: Ferritin: 440 ng/mL — ABNORMAL HIGH (ref 11–307)

## 2022-08-11 MED ORDER — EPOETIN ALFA 10000 UNIT/ML IJ SOLN
INTRAMUSCULAR | Status: AC
  Administered 2022-08-11: 10000 [IU] via SUBCUTANEOUS
  Filled 2022-08-11: qty 1

## 2022-08-11 MED ORDER — EPOETIN ALFA 10000 UNIT/ML IJ SOLN: 10000.0000 [IU] | Freq: Once | INTRAMUSCULAR | Status: AC

## 2022-08-11 MED ORDER — EPOETIN ALFA-EPBX 10000 UNIT/ML IJ SOLN: 10000.0000 [IU] | INTRAMUSCULAR | Status: DC

## 2022-08-12 LAB — POCT HEMOGLOBIN-HEMACUE: Hemoglobin: 11.9 g/dL — ABNORMAL LOW (ref 12.0–15.0)

## 2022-09-08 ENCOUNTER — Encounter (HOSPITAL_COMMUNITY): Payer: Medicare Other

## 2022-09-08 ENCOUNTER — Ambulatory Visit (HOSPITAL_COMMUNITY)
Admission: RE | Admit: 2022-09-08 | Discharge: 2022-09-08 | Disposition: A | Discharge status: Home / Self Care | Payer: Medicare Other | Source: Ambulatory Visit | Attending: Nephrology | Admitting: Nephrology

## 2022-09-08 VITALS — BP 150/70 | HR 72 | Temp 98.4°F | Resp 16

## 2022-09-08 DIAGNOSIS — N189 Chronic kidney disease, unspecified: Secondary | ICD-10-CM | POA: Insufficient documentation

## 2022-09-08 DIAGNOSIS — D631 Anemia in chronic kidney disease: Secondary | ICD-10-CM | POA: Insufficient documentation

## 2022-09-08 DIAGNOSIS — N184 Chronic kidney disease, stage 4 (severe): Secondary | ICD-10-CM | POA: Insufficient documentation

## 2022-09-08 LAB — FERRITIN: Ferritin: 544 ng/mL — ABNORMAL HIGH (ref 11–307)

## 2022-09-08 LAB — IRON AND TIBC
Iron: 97 ug/dL (ref 28–170)
Saturation Ratios: 30 % (ref 10.4–31.8)
TIBC: 321 ug/dL (ref 250–450)
UIBC: 224 ug/dL

## 2022-09-08 LAB — POCT HEMOGLOBIN-HEMACUE: Hemoglobin: 10.9 g/dL — ABNORMAL LOW (ref 12.0–15.0)

## 2022-09-08 MED ORDER — EPOETIN ALFA-EPBX 10000 UNIT/ML IJ SOLN: 10000.0000 [IU] | INTRAMUSCULAR | Status: DC

## 2022-09-08 MED ORDER — EPOETIN ALFA-EPBX 10000 UNIT/ML IJ SOLN
INTRAMUSCULAR | Status: AC
  Administered 2022-09-08: 10000 [IU] via SUBCUTANEOUS
  Filled 2022-09-08: qty 1

## 2022-09-09 ENCOUNTER — Ambulatory Visit (INDEPENDENT_AMBULATORY_CARE_PROVIDER_SITE_OTHER): Payer: Medicare Other | Admitting: Internal Medicine

## 2022-09-09 ENCOUNTER — Encounter (INDEPENDENT_AMBULATORY_CARE_PROVIDER_SITE_OTHER): Payer: Self-pay | Admitting: Internal Medicine

## 2022-09-09 VITALS — BP 135/77 | HR 74 | Temp 98.4°F | Ht 59.0 in | Wt 212.0 lb

## 2022-09-09 DIAGNOSIS — Z6841 Body Mass Index (BMI) 40.0 and over, adult: Secondary | ICD-10-CM | POA: Diagnosis not present

## 2022-09-09 NOTE — Progress Notes (Signed)
Office: [phone]  /  Fax: [phone]  Initial Visit  Cheryl [name] was seen in clinic today to evaluate for obesity.  She was referred by pulmonology as she is wanting to come off CPAP therapy. She was not sure why she was here. She has multiple chronic conditions, dietary restrictions (potassium) due to advanced CKD. She is followed by nephrology (Dr. Royce Macadamia) and is undergoing evaluation for an AV fistula. She is somewhat overwhelmed by number of appointments and declining health. She had not been thinking about weight loss and does not feel this is a good time due to competing medical needs.  She has tried to lose weight in past but has problems with regain. At present feels restricted and only eats 1 to 2 meals a day. No PA  She presents today to review program treatment options, initial physical assessment, and evaluation.      Past medical history includes:   Past Medical History:  Diagnosis Date   Acid reflux    Arthritis    CKD (chronic kidney disease)    Constipation    GERD (gastroesophageal reflux disease)    Macular retinoschisis of right eye 02/23/2021   Neuromuscular disorder (HCC)    PUD (peptic ulcer disease) 2017   gastric ulcer healed on repeat EGD in August 2017   Sinus drainage    Sleep apnea    had surgery to correct   Vitreomacular traction syndrome, with secondary hole formation 01/12/2021   Vitrectomy membrane peel gas injection right eye 05-27-2021     Objective:   BP 135/77   Pulse 74   Temp 98.4 F (36.9 C)   Ht '4\' 11"'$  (1.499 m)   Wt 212 lb (96.2 kg)   SpO2 100%   BMI 42.82 kg/m  She was weighed on the bioimpedance scale:  Body mass index is 42.82 kg/m.  General:  Alert, oriented and cooperative. Patient is in no acute distress.  Respiratory: Normal respiratory effort, no problems with respiration noted  Extremities: Normal range of motion.    Mental Status: Normal mood and affect. Normal behavior. Normal judgment and thought content.    Assessment and Plan:  1. Class 3 severe obesity with serious comorbidity and body mass index (BMI) of 40.0 to 44.9 in adult, unspecified obesity type ([place])      Obesity Treatment Plan:  Patient is not ready to start journey due to competing medical problems and medical appointments. She is overwhelmed and had not been considering weight loss.  She was educated today on energy regulation and eating patterns.  I encouraged her to eat three meals a day and discuss with her nephrologist about protein intake. Food recall shows she is not getting adequate nutrition.    Patient was also counseled and support was provided regarding living with chronic conditions. She will contact us when she feels ready to partner with [place].   Obesity Education Performed Today:  She was weighed on the bioimpedance scale and results were discussed and documented in the synopsis.  We discussed obesity as a disease and the importance of a more detailed evaluation of all the factors contributing to the disease.  We discussed the importance of long term lifestyle changes which include nutrition, exercise and behavioral modifications as well as the importance of customizing this to her specific health and social needs.  We discussed the benefits of reaching a healthier weight to alleviate the symptoms of existing conditions and reduce the risks of the biomechanical, metabolic and psychological effects  of obesity.  We discussed the goals of this program is to improve her overall health and not simply achieve a specific BMI.  I explained obesity is a life-long chronic disease and long term treatments would be required. Medications to help her follow his eating plan may be offered as appropriate but are not required. All medication decisions will be made together after the initial workup is done and benefits and side effects are discussed in depth.  The clinic rules were reviewed including the late policy, cancellation  policy, no show and program fees.  Larence Penning Roye appears to be in the precontemplation stage of change and states they are not ready to start intensive lifestyle modifications and behavioral modifications.  30 minutes was spent today on this visit including the above counseling, pre-visit chart review, and post-visit documentation.  Thomes Dinning, MD

## 2022-10-06 ENCOUNTER — Ambulatory Visit (HOSPITAL_COMMUNITY)
Admission: RE | Admit: 2022-10-06 | Discharge: 2022-10-06 | Disposition: A | Discharge status: Home / Self Care | Payer: Medicare Other | Source: Ambulatory Visit | Attending: Nephrology | Admitting: Nephrology

## 2022-10-06 VITALS — BP 124/72 | HR 71 | Temp 98.6°F | Resp 18

## 2022-10-06 DIAGNOSIS — N189 Chronic kidney disease, unspecified: Secondary | ICD-10-CM | POA: Insufficient documentation

## 2022-10-06 DIAGNOSIS — D631 Anemia in chronic kidney disease: Secondary | ICD-10-CM | POA: Diagnosis present

## 2022-10-06 LAB — IRON AND TIBC
Iron: 77 ug/dL (ref 28–170)
Saturation Ratios: 24 % (ref 10.4–31.8)
TIBC: 328 ug/dL (ref 250–450)
UIBC: 251 ug/dL

## 2022-10-06 LAB — POCT HEMOGLOBIN-HEMACUE: Hemoglobin: 10.8 g/dL — ABNORMAL LOW (ref 12.0–15.0)

## 2022-10-06 LAB — FERRITIN: Ferritin: 514 ng/mL — ABNORMAL HIGH (ref 11–307)

## 2022-10-06 MED ORDER — EPOETIN ALFA-EPBX 10000 UNIT/ML IJ SOLN
10000.0000 [IU] | INTRAMUSCULAR | Status: DC
  Administered 2022-10-06: 10000 [IU] via SUBCUTANEOUS

## 2022-10-06 MED ORDER — EPOETIN ALFA-EPBX 10000 UNIT/ML IJ SOLN
INTRAMUSCULAR | Status: AC
  Filled 2022-10-06: qty 1

## 2022-10-07 ENCOUNTER — Encounter (INDEPENDENT_AMBULATORY_CARE_PROVIDER_SITE_OTHER): Payer: Medicare Other | Admitting: Ophthalmology

## 2022-11-03 ENCOUNTER — Ambulatory Visit (HOSPITAL_COMMUNITY)
Admission: RE | Admit: 2022-11-03 | Discharge: 2022-11-03 | Disposition: A | Discharge status: Home / Self Care | Payer: Medicare Other | Source: Ambulatory Visit | Attending: Nephrology | Admitting: Nephrology

## 2022-11-03 VITALS — BP 137/71 | HR 71 | Temp 98.8°F | Resp 18

## 2022-11-03 DIAGNOSIS — D631 Anemia in chronic kidney disease: Secondary | ICD-10-CM | POA: Insufficient documentation

## 2022-11-03 DIAGNOSIS — N185 Chronic kidney disease, stage 5: Secondary | ICD-10-CM | POA: Insufficient documentation

## 2022-11-03 DIAGNOSIS — N189 Chronic kidney disease, unspecified: Secondary | ICD-10-CM | POA: Diagnosis present

## 2022-11-03 LAB — IRON AND TIBC
Iron: 71 ug/dL (ref 28–170)
Saturation Ratios: 21 % (ref 10.4–31.8)
TIBC: 339 ug/dL (ref 250–450)
UIBC: 268 ug/dL

## 2022-11-03 LAB — FERRITIN: Ferritin: 476 ng/mL — ABNORMAL HIGH (ref 11–307)

## 2022-11-03 LAB — POCT HEMOGLOBIN-HEMACUE: Hemoglobin: 11.1 g/dL — ABNORMAL LOW (ref 12.0–15.0)

## 2022-11-03 MED ORDER — EPOETIN ALFA-EPBX 10000 UNIT/ML IJ SOLN
INTRAMUSCULAR | Status: AC
  Administered 2022-11-03: 10000 [IU] via SUBCUTANEOUS
  Filled 2022-11-03: qty 1

## 2022-11-03 MED ORDER — EPOETIN ALFA-EPBX 10000 UNIT/ML IJ SOLN: 10000.0000 [IU] | INTRAMUSCULAR | Status: DC

## 2022-12-01 ENCOUNTER — Ambulatory Visit (HOSPITAL_COMMUNITY)
Admission: RE | Admit: 2022-12-01 | Discharge: 2022-12-01 | Disposition: A | Discharge status: Home / Self Care | Payer: Medicare Other | Source: Ambulatory Visit | Attending: Nephrology | Admitting: Nephrology

## 2022-12-01 VITALS — BP 139/71 | HR 72 | Temp 97.6°F | Resp 18

## 2022-12-01 DIAGNOSIS — D631 Anemia in chronic kidney disease: Secondary | ICD-10-CM | POA: Diagnosis present

## 2022-12-01 DIAGNOSIS — N185 Chronic kidney disease, stage 5: Secondary | ICD-10-CM | POA: Diagnosis not present

## 2022-12-01 DIAGNOSIS — N189 Chronic kidney disease, unspecified: Secondary | ICD-10-CM | POA: Insufficient documentation

## 2022-12-01 LAB — IRON AND TIBC
Iron: 70 ug/dL (ref 28–170)
Saturation Ratios: 22 % (ref 10.4–31.8)
TIBC: 325 ug/dL (ref 250–450)
UIBC: 255 ug/dL

## 2022-12-01 LAB — FERRITIN: Ferritin: 486 ng/mL — ABNORMAL HIGH (ref 11–307)

## 2022-12-01 LAB — POCT HEMOGLOBIN-HEMACUE: Hemoglobin: 10.4 g/dL — ABNORMAL LOW (ref 12.0–15.0)

## 2022-12-01 MED ORDER — EPOETIN ALFA-EPBX 10000 UNIT/ML IJ SOLN
INTRAMUSCULAR | Status: AC
  Filled 2022-12-01: qty 1

## 2022-12-01 MED ORDER — EPOETIN ALFA-EPBX 10000 UNIT/ML IJ SOLN
10000.0000 [IU] | INTRAMUSCULAR | Status: DC
  Administered 2022-12-01: 10000 [IU] via SUBCUTANEOUS

## 2022-12-20 DIAGNOSIS — G4733 Obstructive sleep apnea (adult) (pediatric): Secondary | ICD-10-CM | POA: Diagnosis not present

## 2022-12-21 DIAGNOSIS — J069 Acute upper respiratory infection, unspecified: Secondary | ICD-10-CM | POA: Diagnosis not present

## 2022-12-21 DIAGNOSIS — M722 Plantar fascial fibromatosis: Secondary | ICD-10-CM | POA: Diagnosis not present

## 2022-12-22 ENCOUNTER — Encounter (HOSPITAL_COMMUNITY): Payer: Self-pay

## 2022-12-29 ENCOUNTER — Ambulatory Visit (HOSPITAL_COMMUNITY)
Admission: RE | Admit: 2022-12-29 | Discharge: 2022-12-29 | Disposition: A | Discharge status: Home / Self Care | Payer: Medicare Other | Source: Ambulatory Visit | Attending: Nephrology | Admitting: Nephrology

## 2022-12-29 VITALS — BP 129/75 | HR 80 | Temp 97.1°F | Resp 18

## 2022-12-29 DIAGNOSIS — D631 Anemia in chronic kidney disease: Secondary | ICD-10-CM | POA: Diagnosis not present

## 2022-12-29 DIAGNOSIS — N189 Chronic kidney disease, unspecified: Secondary | ICD-10-CM | POA: Diagnosis not present

## 2022-12-29 LAB — IRON AND TIBC
Iron: 69 ug/dL (ref 28–170)
Saturation Ratios: 21 % (ref 10.4–31.8)
TIBC: 326 ug/dL (ref 250–450)
UIBC: 257 ug/dL

## 2022-12-29 LAB — FERRITIN: Ferritin: 426 ng/mL — ABNORMAL HIGH (ref 11–307)

## 2022-12-29 LAB — POCT HEMOGLOBIN-HEMACUE: Hemoglobin: 11.3 g/dL — ABNORMAL LOW (ref 12.0–15.0)

## 2022-12-29 MED ORDER — EPOETIN ALFA-EPBX 10000 UNIT/ML IJ SOLN
10000.0000 [IU] | INTRAMUSCULAR | Status: DC
  Administered 2022-12-29: 10000 [IU] via SUBCUTANEOUS

## 2022-12-29 MED ORDER — EPOETIN ALFA-EPBX 10000 UNIT/ML IJ SOLN
INTRAMUSCULAR | Status: AC
  Filled 2022-12-29: qty 1

## 2022-12-30 ENCOUNTER — Ambulatory Visit (HOSPITAL_COMMUNITY)
Admission: RE | Admit: 2022-12-30 | Discharge: 2022-12-30 | Disposition: A | Discharge status: Home / Self Care | Payer: Medicare Other | Source: Ambulatory Visit | Attending: Family Medicine | Admitting: Family Medicine

## 2022-12-30 ENCOUNTER — Other Ambulatory Visit (HOSPITAL_COMMUNITY): Payer: Self-pay | Admitting: Family Medicine

## 2022-12-30 DIAGNOSIS — N184 Chronic kidney disease, stage 4 (severe): Secondary | ICD-10-CM | POA: Diagnosis not present

## 2022-12-30 DIAGNOSIS — R059 Cough, unspecified: Secondary | ICD-10-CM | POA: Diagnosis not present

## 2022-12-30 DIAGNOSIS — J069 Acute upper respiratory infection, unspecified: Secondary | ICD-10-CM | POA: Insufficient documentation

## 2023-01-05 DIAGNOSIS — N185 Chronic kidney disease, stage 5: Secondary | ICD-10-CM | POA: Diagnosis not present

## 2023-01-10 DIAGNOSIS — M109 Gout, unspecified: Secondary | ICD-10-CM | POA: Diagnosis not present

## 2023-01-10 DIAGNOSIS — E875 Hyperkalemia: Secondary | ICD-10-CM | POA: Diagnosis not present

## 2023-01-10 DIAGNOSIS — N058 Unspecified nephritic syndrome with other morphologic changes: Secondary | ICD-10-CM | POA: Diagnosis not present

## 2023-01-10 DIAGNOSIS — N2581 Secondary hyperparathyroidism of renal origin: Secondary | ICD-10-CM | POA: Diagnosis not present

## 2023-01-10 DIAGNOSIS — D631 Anemia in chronic kidney disease: Secondary | ICD-10-CM | POA: Diagnosis not present

## 2023-01-10 DIAGNOSIS — E872 Acidosis, unspecified: Secondary | ICD-10-CM | POA: Diagnosis not present

## 2023-01-10 DIAGNOSIS — N185 Chronic kidney disease, stage 5: Secondary | ICD-10-CM | POA: Diagnosis not present

## 2023-01-10 DIAGNOSIS — Z79899 Other long term (current) drug therapy: Secondary | ICD-10-CM | POA: Diagnosis not present

## 2023-01-10 DIAGNOSIS — N057 Unspecified nephritic syndrome with diffuse crescentic glomerulonephritis: Secondary | ICD-10-CM | POA: Diagnosis not present

## 2023-01-10 DIAGNOSIS — D84821 Immunodeficiency due to drugs: Secondary | ICD-10-CM | POA: Diagnosis not present

## 2023-01-10 DIAGNOSIS — E785 Hyperlipidemia, unspecified: Secondary | ICD-10-CM | POA: Diagnosis not present

## 2023-01-10 DIAGNOSIS — I120 Hypertensive chronic kidney disease with stage 5 chronic kidney disease or end stage renal disease: Secondary | ICD-10-CM | POA: Diagnosis not present

## 2023-01-20 DIAGNOSIS — G4733 Obstructive sleep apnea (adult) (pediatric): Secondary | ICD-10-CM | POA: Diagnosis not present

## 2023-01-25 ENCOUNTER — Encounter (HOSPITAL_COMMUNITY)
Admission: RE | Admit: 2023-01-25 | Discharge: 2023-01-25 | Disposition: A | Discharge status: Home / Self Care | Payer: Medicare Other | Source: Ambulatory Visit | Attending: Nephrology | Admitting: Nephrology

## 2023-01-25 VITALS — BP 133/75 | HR 81 | Temp 97.1°F | Resp 17

## 2023-01-25 DIAGNOSIS — N189 Chronic kidney disease, unspecified: Secondary | ICD-10-CM | POA: Diagnosis not present

## 2023-01-25 DIAGNOSIS — D631 Anemia in chronic kidney disease: Secondary | ICD-10-CM | POA: Diagnosis not present

## 2023-01-25 LAB — POCT HEMOGLOBIN-HEMACUE: Hemoglobin: 10.9 g/dL — ABNORMAL LOW (ref 12.0–15.0)

## 2023-01-25 LAB — FERRITIN: Ferritin: 438 ng/mL — ABNORMAL HIGH (ref 11–307)

## 2023-01-25 LAB — IRON AND TIBC
Iron: 90 ug/dL (ref 28–170)
Saturation Ratios: 29 % (ref 10.4–31.8)
TIBC: 315 ug/dL (ref 250–450)
UIBC: 225 ug/dL

## 2023-01-25 MED ORDER — EPOETIN ALFA-EPBX 10000 UNIT/ML IJ SOLN
INTRAMUSCULAR | Status: AC
  Filled 2023-01-25: qty 1

## 2023-01-25 MED ORDER — EPOETIN ALFA-EPBX 10000 UNIT/ML IJ SOLN
10000.0000 [IU] | INTRAMUSCULAR | Status: DC
  Administered 2023-01-25: 10000 [IU] via SUBCUTANEOUS

## 2023-01-26 ENCOUNTER — Encounter (HOSPITAL_COMMUNITY): Payer: BC Managed Care – PPO

## 2023-02-14 DIAGNOSIS — N185 Chronic kidney disease, stage 5: Secondary | ICD-10-CM | POA: Diagnosis not present

## 2023-02-16 ENCOUNTER — Ambulatory Visit (INDEPENDENT_AMBULATORY_CARE_PROVIDER_SITE_OTHER): Payer: Medicare Other | Admitting: Pulmonary Disease

## 2023-02-16 ENCOUNTER — Encounter (HOSPITAL_BASED_OUTPATIENT_CLINIC_OR_DEPARTMENT_OTHER): Payer: Self-pay | Admitting: Pulmonary Disease

## 2023-02-16 VITALS — BP 138/78 | HR 76 | Ht 59.0 in | Wt 213.4 lb

## 2023-02-16 DIAGNOSIS — J849 Interstitial pulmonary disease, unspecified: Secondary | ICD-10-CM | POA: Diagnosis not present

## 2023-02-16 DIAGNOSIS — J691 Pneumonitis due to inhalation of oils and essences: Secondary | ICD-10-CM

## 2023-02-16 LAB — PULMONARY FUNCTION TEST
DL/VA % pred: 107 %
DL/VA: 4.6 ml/min/mmHg/L
DLCO cor % pred: 70 %
DLCO cor: 11.27 ml/min/mmHg
DLCO unc % pred: 64 %
DLCO unc: 10.3 ml/min/mmHg
FEF 25-75 Post: 1.9 L/sec
FEF 25-75 Pre: 2.25 L/sec
FEF2575-%Change-Post: -15 %
FEF2575-%Pred-Post: 132 %
FEF2575-%Pred-Pre: 157 %
FEV1-%Change-Post: -1 %
FEV1-%Pred-Post: 78 %
FEV1-%Pred-Pre: 79 %
FEV1-Post: 1.32 L
FEV1-Pre: 1.33 L
FEV1FVC-%Change-Post: 1 %
FEV1FVC-%Pred-Pre: 122 %
FEV6-%Change-Post: -3 %
FEV6-%Pred-Post: 65 %
FEV6-%Pred-Pre: 67 %
FEV6-Post: 1.4 L
FEV6-Pre: 1.45 L
FEV6FVC-%Change-Post: 0 %
FEV6FVC-%Pred-Post: 105 %
FEV6FVC-%Pred-Pre: 105 %
FVC-%Change-Post: -3 %
FVC-%Pred-Post: 61 %
FVC-%Pred-Pre: 64 %
FVC-Post: 1.4 L
FVC-Pre: 1.45 L
Post FEV1/FVC ratio: 94 %
Post FEV6/FVC ratio: 100 %
Pre FEV1/FVC ratio: 92 %
Pre FEV6/FVC Ratio: 100 %
RV % pred: 81 %
RV: 1.64 L
TLC % pred: 72 %
TLC: 3.14 L

## 2023-02-16 NOTE — Patient Instructions (Addendum)
LIP/Interstitial lung disease  Holding immunosuppressants --Monitor with repeat CT Chest without contrast and PFTs in 6 months   Obstructive sleep apnea Recommend oral appliance for sleep apnea. Please call and discuss if your current dentist provides this service  If your dentist would like to refer, we recommend Dr. Oneal Grout at [phone]. We can also place a referral if needed  Follow-up with me in 6 months with PFTs prior to visit

## 2023-02-16 NOTE — Progress Notes (Signed)
Full PFT Performed Today. 

## 2023-02-16 NOTE — Patient Instructions (Signed)
Full PFT Performed Today. 

## 2023-02-16 NOTE — Progress Notes (Signed)
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  Synopsis: Diagnosed with pauci immune necrotizing glomerulonephritis and started on cytoxan 04/2020. On evaluation for kidney transplant, PFTs were incidentally found with restrictive defect with reduced DLCO.  At baseline, she is active and able to perform her ADLs. She does have some shortness of breath with heavy exertion but does not feel this limits her activity. Denies shortness of breath, cough or wheezing when ambulating within house or when performing tasks including grocery shopping or yardwork unless it requires heavy lifting.  On prior visit she did report she has a history of sleep apnea s/p tonsillectomy >10 years ago. She was unable to tolerate CPAP. Currently she is snoring at night. Unsure if she has any apneic episodes. Reports decreased energy, excessive daytime sleepiness/drowsiness. Denies gasping or choking episodes at night but does awaken due to nocturia. Her sleep hygiene is poor and often watches TV into the night.  01/06/22 Since our last visit, she reports completing her sleep studies but she is not currently on CPAP. CPAP titration on 10/13/21 was documented to be completed but no report available. She has some dyspnea on heavy exertion but denies any symptoms of dyspnea, cough or wheezing at baseline when performing her ADLs.   04/04/22 Since her last visit she has been to [facility] for pretransplant evaluation.  Note by Dr. Mancel Bale on 03/04/2022 reviewed.  Her main risk factor is her age, BMI, and ILD. She has also been counseled on options for dialysis access by her nephrologist but  not yet on dialysis. Rarely uses her albuterol once a month at most  04/02/22 Overall doing well. No respiratory symptoms. Denies shortness of breath, cough or wheezing.  02/16/23 Since our last visit, denies respiratory symptoms. Denies shortness of breath, cough or wheezing. She is overall compliant with CPAP however was ill last month. Average 4 hours nightly. Her sleep is irregular. Is interested in oral compliance device  Social History: Smoked 1/4 ppd since she was 28. Quit in 2006.  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 >30 years  Past Medical History:  Diagnosis Date   Acid reflux    Arthritis    CKD (chronic kidney disease)    Constipation    GERD (gastroesophageal reflux disease)    Macular retinoschisis of right eye 02/23/2021   Neuromuscular disorder (HCC)    PUD (peptic ulcer disease) 2017   gastric ulcer healed on repeat EGD in August 2017   Sinus drainage    Sleep apnea    had surgery to correct   Vitreomacular traction syndrome, with secondary hole formation 01/12/2021   Vitrectomy membrane peel gas injection right eye 05-27-2021    Allergies  Allergen Reactions   Prednisone Other (See Comments)    Hallucinations    Tramadol Nausea Only   Aspirin Nausea And Vomiting   Celecoxib Rash     Outpatient Medications Prior to Visit  Medication Sig Dispense Refill   acetaminophen (TYLENOL) 500 MG tablet Take 500-1,000 mg by mouth as needed (for pain/headache).  albuterol (VENTOLIN HFA) 108 (90 Base) MCG/ACT inhaler Inhale 2 puffs into the lungs every 6 (six) hours as needed for wheezing or shortness of breath. 18 g 2   amLODipine (NORVASC) 2.5 MG tablet Take 2.5 mg by mouth daily.     buPROPion (WELLBUTRIN XL) 300 MG 24 hr tablet Take 300 mg by mouth daily.     Cholecalciferol (VITAMIN D3) 125 MCG (5000 UT) CAPS Take 1 capsule by mouth daily.     cholecalciferol (VITAMIN D3) 25 MCG (1000 UNIT)  tablet 1 tablet     clobetasol ointment (TEMOVATE) 0.05 %      clotrimazole-betamethasone (LOTRISONE) cream Apply topically.     colchicine 0.6 MG tablet Take 0.6 mg by mouth daily.     Cyanocobalamin (VITAMIN B-12) 5000 MCG TBDP Take 1,000 mcg by mouth daily.      diclofenac sodium (VOLTAREN) 1 % GEL Apply 2-4 g topically 4 (four) times daily. 5 Tube 3   Docusate Sodium (DSS) 100 MG CAPS Take by mouth.     fluticasone (FLONASE) 50 MCG/ACT nasal spray Place 2 sprays into both nostrils daily as needed for allergies or rhinitis.     furosemide (LASIX) 80 MG tablet as needed.     guaiFENesin (MUCINEX) 600 MG 12 hr tablet Take 1 tablet (600 mg total) by mouth 2 (two) times daily. (Patient taking differently: Take 600 mg by mouth 2 (two) times daily as needed.) 20 tablet 0   LINZESS 290 MCG CAPS capsule TAKE 1 CAPSULE BY MOUTH DAILY BEFORE BREAKFAST. 90 capsule 3   losartan (COZAAR) 25 MG tablet Take 12.5 mg by mouth at bedtime.     metoprolol succinate (TOPROL-XL) 50 MG 24 hr tablet Take 50 mg by mouth daily. Take with or immediately following a meal.     montelukast (SINGULAIR) 10 MG tablet Take 1 tablet (10 mg total) by mouth every morning. 30 tablet 2   nystatin (MYCOSTATIN) 100000 UNIT/ML suspension Take 5 mLs by mouth 2 (two) times daily as needed (for thrush).      pantoprazole (PROTONIX) 40 MG tablet TAKE 1 TABLET BY MOUTH 1 OR 2 TIMES DAILY BEFORE A MEAL FOR REFLUX 180 tablet 1   polyethylene glycol (MIRALAX / GLYCOLAX) packet Take 17 g by mouth daily as needed for mild constipation or moderate constipation.     raloxifene (EVISTA) 60 MG tablet Take 60 mg by mouth daily.     rOPINIRole (REQUIP) 0.25 MG tablet Take 0.75 mg by mouth daily as needed (for rls).      rosuvastatin (CRESTOR) 10 MG tablet Take 10 mg by mouth at bedtime.     ondansetron (ZOFRAN) 4 MG tablet Take 4 mg by mouth as needed for nausea or vomiting.     Vitamin D, Ergocalciferol, (DRISDOL) 1.25 MG (50000 UNIT) CAPS capsule  Take 50,000 Units by mouth every 7 (seven) days. (Patient not taking: Reported on 02/16/2023)     No facility-administered medications prior to visit.    Review of Systems  Constitutional:  Negative for chills, diaphoresis, fever, malaise/fatigue and weight loss.  HENT:  Negative for congestion.   Respiratory:  Negative for cough, hemoptysis, sputum production, shortness of breath and wheezing.   Cardiovascular:  Negative for chest pain, palpitations and leg swelling.     Objective:   Vitals:   02/16/23 1143  BP: 138/78  Pulse: 76  SpO2: 97%  Weight: 213 lb 6.4 oz (96.8 kg)  Height: '4\' 11"'$  (1.499 m)   SpO2: 97 %  O2 Device: None (Room air)  Body mass index is 43.1 kg/m.  Physical Exam: General: Well-appearing, no acute distress HENT: [place], AT Eyes: EOMI, no scleral icterus Respiratory: Clear to auscultation bilaterally.  No crackles, wheezing or rales Cardiovascular: RRR, -M/R/G, no JVD Extremities:-Edema,-tenderness Neuro: AAO x4, CNII-XII grossly intact Psych: Normal mood, normal affect   Data Reviewed:  Imaging: CT A/P Lung fields 02/19/12 Bibasilar atelectasis. Scattered small blebs CXR 04/19/20 - Stable bilateral pulmonary opacities, linear scarring in lingular CT Chest 05/23/21 - Scattered thin walled cysts bilaterally, minimal bibasilar fibrosis. Background emphysema CT Chest 06/23/22 - Mild basilar predominant GGO and reticular opacities with scattered thin wall cysts. No progression  PFT: 05/11/21 FVC  1.49 (53%) FEV1 1.36 (63%) Ratio 91  TLC 60% DLCO 41% Interpretation: Moderate restrictive defect with moderately reduced diffusing capacity  01/06/22 FVC 1.31 (75%) FEV1 1.03 (77%) Ratio 73  TLC 69% DLCO 55% Interpretation: Mild restrictive defect with moderately reduced diffusing capacity, improved values  02/16/23 FVC 1.40 (61%) FEV1 1.32 (78%) Ratio 92  TLC 72% DLCO 64% Interpretation: Mild restrictive defect with mildly reduced DLCO  Sleep study: 08/11/21-  AHI 44/hr. SpO2 nadir 69% PAP titration 10/12/21 - Trial of CPAP therapy on 15 cm H2O or autopap 10-20.  CPAP Compliance 12/17/22-01/25/23 Usage days 46/60 (77%) >4 hours 37 days (62%) CPAP 5-20 AHI 3.7 Assessment & Plan:   Discussion: 77 year old female former smoker (5 pack years) with LIP, pauci immune necrotizing glomerulonephritis, stage V CKD who presents for follow-up. Prior CT imaging stable. PFTs with improved DLCO.  Her restrictive defect likely combination of LIP and morbid obesity. Lung function improved and radiographic findings overall stable.  LIP/Interstitial lung disease - stable CT and PFTs Labs were neg: HIV, immunoglobulins, RF, anti-CCP Stable radiographic findings >1 year --Reviewed PFTs. Stable mild restrictive defect with improved DLCO to mild --CONTINUE Albuterol as needed for shortness of breath or wheezing --Discussed with Dr. Royce Macadamia, Nephrology, regarding her immunosuppressant regimen. Currently off --Monitor with repeat CT Chest without contrast and PFTs in 6 months   Severe OSA --Patient reports compliance with CPAP --Continue auto CPAP 5-20 mm Hg  --Discussed oral compliance --Please call and discuss if your current dentist provides this service  If your dentist would like to refer, we recommend Dr. Oneal Grout at [phone]. We can also place a referral if needed  Health Maintenance Immunization History  Administered Date(s) Administered   Influenza, High Dose Seasonal PF 09/19/2017, 10/02/2018, 10/16/2019   Influenza-Unspecified 10/08/2021   Moderna Sars-Covid-2 Vaccination 02/01/2020, 10/13/2020, 12/29/2020   Pneumococcal Conjugate-13 10/02/2017   Zoster Recombinat (Shingrix) 10/02/2018, 01/11/2019   CT Lung Screen - not indicated  Orders Placed This Encounter  Procedures   CT Chest Wo Contrast    Standing Status:   Future    Standing Expiration Date:   02/17/2024    Scheduling Instructions:     Schedule in 6 months (August)    Order  Specific Question:   Preferred imaging location?    Answer:   [facility]   Pulmonary function test    Standing Status:   Future    Standing Expiration Date:   02/17/2024    Scheduling Instructions:     Schedule in 6 months (August 2024)    Order Specific Question:   Where should this test be performed?    Answer:   [facility]    Order Specific Question:   Full PFT: includes the following: basic spirometry, spirometry pre & post bronchodilator, diffusion  capacity (DLCO), lung volumes    Answer:   Full PFT   No orders of the defined types were placed in this encounter.  Return in about 6 months (around 08/17/2023).  I have spent a total time of 35-minutes on the day of the appointment including chart review, data review, collecting history, coordinating care and discussing medical diagnosis and plan with the patient/family. Past medical history, allergies, medications were reviewed. Pertinent imaging, labs and tests included in this note have been reviewed and interpreted independently by me.  [place], MD [facility] 02/16/2023 12:26 PM  Office Number [phone]

## 2023-02-17 DIAGNOSIS — N2581 Secondary hyperparathyroidism of renal origin: Secondary | ICD-10-CM | POA: Diagnosis not present

## 2023-02-17 DIAGNOSIS — D84821 Immunodeficiency due to drugs: Secondary | ICD-10-CM | POA: Diagnosis not present

## 2023-02-17 DIAGNOSIS — D631 Anemia in chronic kidney disease: Secondary | ICD-10-CM | POA: Diagnosis not present

## 2023-02-17 DIAGNOSIS — K219 Gastro-esophageal reflux disease without esophagitis: Secondary | ICD-10-CM | POA: Diagnosis not present

## 2023-02-17 DIAGNOSIS — N185 Chronic kidney disease, stage 5: Secondary | ICD-10-CM | POA: Diagnosis not present

## 2023-02-17 DIAGNOSIS — E872 Acidosis, unspecified: Secondary | ICD-10-CM | POA: Diagnosis not present

## 2023-02-17 DIAGNOSIS — N058 Unspecified nephritic syndrome with other morphologic changes: Secondary | ICD-10-CM | POA: Diagnosis not present

## 2023-02-17 DIAGNOSIS — E875 Hyperkalemia: Secondary | ICD-10-CM | POA: Diagnosis not present

## 2023-02-17 DIAGNOSIS — E785 Hyperlipidemia, unspecified: Secondary | ICD-10-CM | POA: Diagnosis not present

## 2023-02-17 DIAGNOSIS — N057 Unspecified nephritic syndrome with diffuse crescentic glomerulonephritis: Secondary | ICD-10-CM | POA: Diagnosis not present

## 2023-02-17 DIAGNOSIS — I120 Hypertensive chronic kidney disease with stage 5 chronic kidney disease or end stage renal disease: Secondary | ICD-10-CM | POA: Diagnosis not present

## 2023-02-17 DIAGNOSIS — Z79899 Other long term (current) drug therapy: Secondary | ICD-10-CM | POA: Diagnosis not present

## 2023-02-18 DIAGNOSIS — G4733 Obstructive sleep apnea (adult) (pediatric): Secondary | ICD-10-CM | POA: Diagnosis not present

## 2023-02-22 ENCOUNTER — Telehealth: Payer: Self-pay | Admitting: Pulmonary Disease

## 2023-02-22 DIAGNOSIS — G4733 Obstructive sleep apnea (adult) (pediatric): Secondary | ICD-10-CM

## 2023-02-22 NOTE — Telephone Encounter (Signed)
Dr.Ellison, pt is requesting a referral to Dr.Katz for an oral appliance to treat her OSA. Please advise that you are okay with that being ordered?

## 2023-02-22 NOTE — Telephone Encounter (Signed)
Ok to place referral.

## 2023-02-22 NOTE — Telephone Encounter (Signed)
PT would ike a referral for a mouthpiece from Dr. Ron Parker. Her dentist said Ron Parker is the DDS she needs. Her # is [phone]

## 2023-02-23 ENCOUNTER — Ambulatory Visit (HOSPITAL_COMMUNITY)
Admission: RE | Admit: 2023-02-23 | Discharge: 2023-02-23 | Disposition: A | Discharge status: Home / Self Care | Payer: Medicare Other | Source: Ambulatory Visit | Attending: Nephrology | Admitting: Nephrology

## 2023-02-23 VITALS — BP 137/72 | HR 80 | Temp 97.1°F | Resp 17

## 2023-02-23 DIAGNOSIS — D631 Anemia in chronic kidney disease: Secondary | ICD-10-CM | POA: Insufficient documentation

## 2023-02-23 DIAGNOSIS — N189 Chronic kidney disease, unspecified: Secondary | ICD-10-CM | POA: Insufficient documentation

## 2023-02-23 LAB — IRON AND TIBC
Iron: 90 ug/dL (ref 28–170)
Saturation Ratios: 27 % (ref 10.4–31.8)
TIBC: 336 ug/dL (ref 250–450)
UIBC: 246 ug/dL

## 2023-02-23 LAB — FERRITIN: Ferritin: 484 ng/mL — ABNORMAL HIGH (ref 11–307)

## 2023-02-23 LAB — POCT HEMOGLOBIN-HEMACUE: Hemoglobin: 11.1 g/dL — ABNORMAL LOW (ref 12.0–15.0)

## 2023-02-23 MED ORDER — EPOETIN ALFA-EPBX 10000 UNIT/ML IJ SOLN
10000.0000 [IU] | INTRAMUSCULAR | Status: DC
  Administered 2023-02-23: 10000 [IU] via SUBCUTANEOUS

## 2023-02-23 MED ORDER — EPOETIN ALFA-EPBX 10000 UNIT/ML IJ SOLN
INTRAMUSCULAR | Status: AC
  Filled 2023-02-23: qty 1

## 2023-02-23 NOTE — Telephone Encounter (Signed)
Referral has been placed. Nothing further needed.   

## 2023-02-24 DIAGNOSIS — R768 Other specified abnormal immunological findings in serum: Secondary | ICD-10-CM | POA: Diagnosis not present

## 2023-02-24 DIAGNOSIS — K429 Umbilical hernia without obstruction or gangrene: Secondary | ICD-10-CM | POA: Diagnosis not present

## 2023-02-24 DIAGNOSIS — N058 Unspecified nephritic syndrome with other morphologic changes: Secondary | ICD-10-CM | POA: Diagnosis not present

## 2023-02-24 DIAGNOSIS — N184 Chronic kidney disease, stage 4 (severe): Secondary | ICD-10-CM | POA: Diagnosis not present

## 2023-03-05 DIAGNOSIS — G4733 Obstructive sleep apnea (adult) (pediatric): Secondary | ICD-10-CM | POA: Diagnosis not present

## 2023-03-10 DIAGNOSIS — M8589 Other specified disorders of bone density and structure, multiple sites: Secondary | ICD-10-CM | POA: Diagnosis not present

## 2023-03-10 DIAGNOSIS — M810 Age-related osteoporosis without current pathological fracture: Secondary | ICD-10-CM | POA: Diagnosis not present

## 2023-03-16 DIAGNOSIS — N185 Chronic kidney disease, stage 5: Secondary | ICD-10-CM | POA: Diagnosis not present

## 2023-03-21 DIAGNOSIS — G4733 Obstructive sleep apnea (adult) (pediatric): Secondary | ICD-10-CM | POA: Diagnosis not present

## 2023-03-23 ENCOUNTER — Ambulatory Visit (HOSPITAL_COMMUNITY)
Admission: RE | Admit: 2023-03-23 | Discharge: 2023-03-23 | Disposition: A | Discharge status: Home / Self Care | Payer: Medicare Other | Source: Ambulatory Visit | Attending: Nephrology | Admitting: Nephrology

## 2023-03-23 VITALS — BP 143/58 | HR 70 | Temp 97.4°F | Resp 17

## 2023-03-23 DIAGNOSIS — N189 Chronic kidney disease, unspecified: Secondary | ICD-10-CM | POA: Insufficient documentation

## 2023-03-23 DIAGNOSIS — D631 Anemia in chronic kidney disease: Secondary | ICD-10-CM | POA: Insufficient documentation

## 2023-03-23 LAB — IRON AND TIBC
Iron: 92 ug/dL (ref 28–170)
Saturation Ratios: 27 % (ref 10.4–31.8)
TIBC: 343 ug/dL (ref 250–450)
UIBC: 251 ug/dL

## 2023-03-23 LAB — POCT HEMOGLOBIN-HEMACUE: Hemoglobin: 11 g/dL — ABNORMAL LOW (ref 12.0–15.0)

## 2023-03-23 LAB — FERRITIN: Ferritin: 392 ng/mL — ABNORMAL HIGH (ref 11–307)

## 2023-03-23 MED ORDER — EPOETIN ALFA-EPBX 10000 UNIT/ML IJ SOLN
INTRAMUSCULAR | Status: AC
  Administered 2023-03-23: 10000 [IU] via SUBCUTANEOUS
  Filled 2023-03-23: qty 1

## 2023-03-23 MED ORDER — EPOETIN ALFA-EPBX 10000 UNIT/ML IJ SOLN: 10000.0000 [IU] | INTRAMUSCULAR | Status: DC

## 2023-03-25 DIAGNOSIS — D631 Anemia in chronic kidney disease: Secondary | ICD-10-CM | POA: Diagnosis not present

## 2023-03-25 DIAGNOSIS — R768 Other specified abnormal immunological findings in serum: Secondary | ICD-10-CM | POA: Diagnosis not present

## 2023-03-25 DIAGNOSIS — Z79899 Other long term (current) drug therapy: Secondary | ICD-10-CM | POA: Diagnosis not present

## 2023-03-25 DIAGNOSIS — E872 Acidosis, unspecified: Secondary | ICD-10-CM | POA: Diagnosis not present

## 2023-03-25 DIAGNOSIS — N058 Unspecified nephritic syndrome with other morphologic changes: Secondary | ICD-10-CM | POA: Diagnosis not present

## 2023-03-25 DIAGNOSIS — D84821 Immunodeficiency due to drugs: Secondary | ICD-10-CM | POA: Diagnosis not present

## 2023-03-25 DIAGNOSIS — N2581 Secondary hyperparathyroidism of renal origin: Secondary | ICD-10-CM | POA: Diagnosis not present

## 2023-03-25 DIAGNOSIS — E875 Hyperkalemia: Secondary | ICD-10-CM | POA: Diagnosis not present

## 2023-03-25 DIAGNOSIS — N185 Chronic kidney disease, stage 5: Secondary | ICD-10-CM | POA: Diagnosis not present

## 2023-03-25 DIAGNOSIS — I120 Hypertensive chronic kidney disease with stage 5 chronic kidney disease or end stage renal disease: Secondary | ICD-10-CM | POA: Diagnosis not present

## 2023-03-25 DIAGNOSIS — N057 Unspecified nephritic syndrome with diffuse crescentic glomerulonephritis: Secondary | ICD-10-CM | POA: Diagnosis not present

## 2023-03-25 DIAGNOSIS — E785 Hyperlipidemia, unspecified: Secondary | ICD-10-CM | POA: Diagnosis not present

## 2023-04-20 ENCOUNTER — Encounter (HOSPITAL_COMMUNITY)
Admission: RE | Admit: 2023-04-20 | Discharge: 2023-04-20 | Disposition: A | Discharge status: Home / Self Care | Payer: Medicare Other | Source: Ambulatory Visit | Attending: Nephrology | Admitting: Nephrology

## 2023-04-20 DIAGNOSIS — N189 Chronic kidney disease, unspecified: Secondary | ICD-10-CM | POA: Diagnosis not present

## 2023-04-20 DIAGNOSIS — D631 Anemia in chronic kidney disease: Secondary | ICD-10-CM | POA: Insufficient documentation

## 2023-04-20 DIAGNOSIS — G4733 Obstructive sleep apnea (adult) (pediatric): Secondary | ICD-10-CM | POA: Diagnosis not present

## 2023-04-20 DIAGNOSIS — N185 Chronic kidney disease, stage 5: Secondary | ICD-10-CM | POA: Diagnosis not present

## 2023-04-20 LAB — IRON AND TIBC
Iron: 54 ug/dL (ref 28–170)
Saturation Ratios: 18 % (ref 10.4–31.8)
TIBC: 302 ug/dL (ref 250–450)
UIBC: 248 ug/dL

## 2023-04-20 LAB — FERRITIN: Ferritin: 405 ng/mL — ABNORMAL HIGH (ref 11–307)

## 2023-04-20 LAB — POCT HEMOGLOBIN-HEMACUE: Hemoglobin: 11.3 g/dL — ABNORMAL LOW (ref 12.0–15.0)

## 2023-04-20 MED ORDER — EPOETIN ALFA-EPBX 10000 UNIT/ML IJ SOLN
INTRAMUSCULAR | Status: AC
  Filled 2023-04-20: qty 1

## 2023-04-20 MED ORDER — EPOETIN ALFA-EPBX 10000 UNIT/ML IJ SOLN
10000.0000 [IU] | INTRAMUSCULAR | Status: DC
  Administered 2023-04-20: 10000 [IU] via SUBCUTANEOUS

## 2023-04-29 DIAGNOSIS — E785 Hyperlipidemia, unspecified: Secondary | ICD-10-CM | POA: Diagnosis not present

## 2023-04-29 DIAGNOSIS — Z79899 Other long term (current) drug therapy: Secondary | ICD-10-CM | POA: Diagnosis not present

## 2023-04-29 DIAGNOSIS — N185 Chronic kidney disease, stage 5: Secondary | ICD-10-CM | POA: Diagnosis not present

## 2023-04-29 DIAGNOSIS — E875 Hyperkalemia: Secondary | ICD-10-CM | POA: Diagnosis not present

## 2023-04-29 DIAGNOSIS — D631 Anemia in chronic kidney disease: Secondary | ICD-10-CM | POA: Diagnosis not present

## 2023-04-29 DIAGNOSIS — K219 Gastro-esophageal reflux disease without esophagitis: Secondary | ICD-10-CM | POA: Diagnosis not present

## 2023-04-29 DIAGNOSIS — E872 Acidosis, unspecified: Secondary | ICD-10-CM | POA: Diagnosis not present

## 2023-04-29 DIAGNOSIS — N058 Unspecified nephritic syndrome with other morphologic changes: Secondary | ICD-10-CM | POA: Diagnosis not present

## 2023-04-29 DIAGNOSIS — N2581 Secondary hyperparathyroidism of renal origin: Secondary | ICD-10-CM | POA: Diagnosis not present

## 2023-04-29 DIAGNOSIS — N057 Unspecified nephritic syndrome with diffuse crescentic glomerulonephritis: Secondary | ICD-10-CM | POA: Diagnosis not present

## 2023-04-29 DIAGNOSIS — D84821 Immunodeficiency due to drugs: Secondary | ICD-10-CM | POA: Diagnosis not present

## 2023-04-29 DIAGNOSIS — I120 Hypertensive chronic kidney disease with stage 5 chronic kidney disease or end stage renal disease: Secondary | ICD-10-CM | POA: Diagnosis not present

## 2023-05-08 ENCOUNTER — Other Ambulatory Visit: Payer: Self-pay

## 2023-05-08 ENCOUNTER — Emergency Department (HOSPITAL_BASED_OUTPATIENT_CLINIC_OR_DEPARTMENT_OTHER)
Admission: EM | Admit: 2023-05-08 | Discharge: 2023-05-08 | Disposition: A | Discharge status: Home / Self Care | Payer: Medicare Other | Attending: Emergency Medicine | Admitting: Emergency Medicine

## 2023-05-08 ENCOUNTER — Emergency Department (HOSPITAL_BASED_OUTPATIENT_CLINIC_OR_DEPARTMENT_OTHER): Payer: Medicare Other | Admitting: Radiology

## 2023-05-08 ENCOUNTER — Encounter (HOSPITAL_BASED_OUTPATIENT_CLINIC_OR_DEPARTMENT_OTHER): Payer: Self-pay

## 2023-05-08 DIAGNOSIS — M7989 Other specified soft tissue disorders: Secondary | ICD-10-CM | POA: Diagnosis not present

## 2023-05-08 DIAGNOSIS — R2241 Localized swelling, mass and lump, right lower limb: Secondary | ICD-10-CM | POA: Diagnosis not present

## 2023-05-08 DIAGNOSIS — M25471 Effusion, right ankle: Secondary | ICD-10-CM | POA: Diagnosis not present

## 2023-05-08 DIAGNOSIS — M79604 Pain in right leg: Secondary | ICD-10-CM | POA: Diagnosis not present

## 2023-05-08 MED ORDER — METHYLPREDNISOLONE 4 MG PO TBPK: ORAL_TABLET | ORAL | 0 refills | Status: DC

## 2023-05-08 NOTE — ED Triage Notes (Signed)
Patient here POV from Home.  Endorses right Foot Pain/Swelling that began 48 Hours ago. Worsened since. No Trauma or Injury. Pulse palpable Distally. No known fevers.   NAD noted during Triage. A&Ox4. Gcs 15. BIB Wheelchair.

## 2023-05-08 NOTE — Discharge Instructions (Addendum)
Please try to keep your ankle elevated is much as possible this week, apply ice packs or cooling packs for 10 minutes at a time to your ankle to help with the swelling.  You can take the prednisone steroid that was prescribed.  Please contact your doctors office to arrange for follow-up visit.  I would recommend buying a cane or a walker to have in the house for some extra stability.

## 2023-05-08 NOTE — ED Provider Notes (Signed)
[place] EMERGENCY DEPARTMENT AT [facility] Provider Note   CSN: [id number] Arrival date & time: 05/08/23  1338     History  Chief Complaint  Patient presents with   Foot Swelling    Cheryl [name] is a 77 y.o. female presenting to the ED with atraumatic right ankle swelling.  Patient ports onset of symptoms 3 days ago.  She went urgent care and referred to the ED.  She denies fevers or chills.  Denies any injuries before.  Reports history of gout  HPI     Home Medications Prior to Admission medications   Medication Sig Start Date End Date Taking? Authorizing Provider  methylPREDNISolone (MEDROL DOSEPAK) 4 MG TBPK tablet Use as directed 05/08/23  Yes Cira Deyoe, Kermit Balo, MD  acetaminophen (TYLENOL) 500 MG tablet Take 500-1,000 mg by mouth as needed (for pain/headache).     [provider]  albuterol (VENTOLIN HFA) 108 (90 Base) MCG/ACT inhaler Inhale 2 puffs into the lungs every 6 (six) hours as needed for wheezing or shortness of breath. 01/06/22   Luciano Cutter, MD  amLODipine (NORVASC) 2.5 MG tablet Take 2.5 mg by mouth daily. 04/27/21   [provider]  buPROPion (WELLBUTRIN XL) 300 MG 24 hr tablet Take 300 mg by mouth daily. 05/15/19   [provider]  Cholecalciferol (VITAMIN D3) 125 MCG (5000 UT) CAPS Take 1 capsule by mouth daily.    [provider]  cholecalciferol (VITAMIN D3) 25 MCG (1000 UNIT) tablet 1 tablet    [provider]  clobetasol ointment (TEMOVATE) 0.05 %  12/22/20   [provider]  clotrimazole-betamethasone (LOTRISONE) cream Apply topically.    [provider]  colchicine 0.6 MG tablet Take 0.6 mg by mouth daily.    [provider]  Cyanocobalamin (VITAMIN B-12) 5000 MCG TBDP Take 1,000 mcg by mouth daily.     [provider]  diclofenac sodium (VOLTAREN) 1 % GEL Apply 2-4 g topically 4 (four) times daily. 08/02/17   Jetty Peeks, PA-C  Docusate Sodium (DSS) 100 MG  CAPS Take by mouth.    [provider]  fluticasone (FLONASE) 50 MCG/ACT nasal spray Place 2 sprays into both nostrils daily as needed for allergies or rhinitis.    [provider]  furosemide (LASIX) 80 MG tablet as needed. 05/26/20   [provider]  guaiFENesin (MUCINEX) 600 MG 12 hr tablet Take 1 tablet (600 mg total) by mouth 2 (two) times daily. Patient taking differently: Take 600 mg by mouth 2 (two) times daily as needed. 03/29/20   Shon Hale, MD  LINZESS 290 MCG CAPS capsule TAKE 1 CAPSULE BY MOUTH DAILY BEFORE BREAKFAST. 10/29/20   Letta Median, PA-C  losartan (COZAAR) 25 MG tablet Take 12.5 mg by mouth at bedtime.    [provider]  metoprolol succinate (TOPROL-XL) 50 MG 24 hr tablet Take 50 mg by mouth daily. Take with or immediately following a meal.    [provider]  montelukast (SINGULAIR) 10 MG tablet Take 1 tablet (10 mg total) by mouth every morning. 03/29/20   Shon Hale, MD  nystatin (MYCOSTATIN) 100000 UNIT/ML suspension Take 5 mLs by mouth 2 (two) times daily as needed (for thrush).  02/22/20   [provider]  ondansetron (ZOFRAN) 4 MG tablet Take 4 mg by mouth as needed for nausea or vomiting. 04/25/20   [provider]  pantoprazole (PROTONIX) 40 MG tablet TAKE 1 TABLET BY MOUTH 1 OR 2 TIMES  DAILY BEFORE A MEAL FOR REFLUX 12/15/21   Gelene Mink, NP  polyethylene glycol [facility] / Ethelene Hal) packet Take 17 g by mouth daily as needed for mild constipation or moderate constipation.    [provider]  raloxifene (EVISTA) 60 MG tablet Take 60 mg by mouth daily.    [provider]  rOPINIRole (REQUIP) 0.25 MG tablet Take 0.75 mg by mouth daily as needed (for rls).  12/12/19   [provider]  rosuvastatin (CRESTOR) 10 MG tablet Take 10 mg by mouth at bedtime.    [provider]  Vitamin D, Ergocalciferol, (DRISDOL) 1.25 MG (50000 UNIT) CAPS capsule Take 50,000 Units by  mouth every 7 (seven) days. Patient not taking: Reported on 02/16/2023    [provider]      Allergies    Prednisone, Tramadol, Aspirin, and Celecoxib    Review of Systems   Review of Systems  Physical Exam Updated Vital Signs BP (!) 132/98 (BP Location: Right Arm)   Pulse 79   Temp (!) 96.8 F (36 C) (Temporal)   Resp 18   Ht 4\' 11"  (1.499 m)   Wt 96.6 kg   SpO2 100%   BMI 43.02 kg/m  Physical Exam Constitutional:      General: She is not in acute distress. HENT:     Head: Normocephalic and atraumatic.  Eyes:     Conjunctiva/sclera: Conjunctivae normal.     Pupils: Pupils are equal, round, and reactive to light.  Cardiovascular:     Rate and Rhythm: Normal rate and regular rhythm.  Pulmonary:     Effort: Pulmonary effort is normal. No respiratory distress.  Abdominal:     General: There is no distension.     Tenderness: There is no abdominal tenderness.  Musculoskeletal:     Comments: Mild warmth effusion of the right ankle.  Patient is able to flex and extend her right foot  Skin:    General: Skin is warm and dry.  Neurological:     General: No focal deficit present.     Mental Status: She is alert. Mental status is at baseline.  Psychiatric:        Mood and Affect: Mood normal.        Behavior: Behavior normal.     ED Results / Procedures / Treatments   Labs (all labs ordered are listed, but only abnormal results are displayed) Labs Reviewed - No data to display  EKG None  Radiology DG Foot Complete Right  Result Date: 05/08/2023 CLINICAL DATA:  Two day history of foot pain and swelling. No known injury. EXAM: RIGHT FOOT COMPLETE - 3 VIEW; RIGHT ANKLE - COMPLETE 3 VIEW COMPARISON:  Right foot radiographs dated 08/23/2019 FINDINGS: There are no findings of fracture or dislocation. No joint effusion. There is no evidence of arthropathy or other focal bone abnormality. Ankle mortise is intact. Circumferential soft tissue swelling of the ankle.  Curvilinear metallic radiodensity projects along the posteromedial distal right tibial diaphysis. IMPRESSION: 1. No acute fracture or dislocation of the right foot or ankle. 2. Circumferential soft tissue swelling of the ankle. 3. Curvilinear metallic radiodensity projects along the posteromedial distal right tibial diaphysis, which may represent a foreign body. Recommend correlation with surgical history in this area. Electronically Signed   By: Agustin Cree M.D.   On: 05/08/2023 14:55   DG Ankle Complete Right  Result Date: 05/08/2023 CLINICAL DATA:  Two day history of foot pain and swelling. No known injury. EXAM:  RIGHT FOOT COMPLETE - 3 VIEW; RIGHT ANKLE - COMPLETE 3 VIEW COMPARISON:  Right foot radiographs dated 08/23/2019 FINDINGS: There are no findings of fracture or dislocation. No joint effusion. There is no evidence of arthropathy or other focal bone abnormality. Ankle mortise is intact. Circumferential soft tissue swelling of the ankle. Curvilinear metallic radiodensity projects along the posteromedial distal right tibial diaphysis. IMPRESSION: 1. No acute fracture or dislocation of the right foot or ankle. 2. Circumferential soft tissue swelling of the ankle. 3. Curvilinear metallic radiodensity projects along the posteromedial distal right tibial diaphysis, which may represent a foreign body. Recommend correlation with surgical history in this area. Electronically Signed   By: Agustin Cree M.D.   On: 05/08/2023 14:55    Procedures Procedures    Medications Ordered in ED Medications - No data to display  ED Course/ Medical Decision Making/ A&P                             Medical Decision Making  Patient is here with atraumatic swelling of the right ankle.  I suspect this may be gout or pseudogout.  She has no risk factors for septic joint, do not see indication for emergent arthrocentesis.  We discussed anti-inflammatory course with prednisone, which she tolerates fine in low doses, per her  report.  She does not take NSAIDs due to kidney disease.  X-rays ordered in triage and reviewed by myself, no acute fracture.  Patient did have a toe ring that was removed by myself to prevent future entrapment from swelling.  This is not consistent with DVT in my exam.  The patient does not have any calf pain or swelling.  Her symptoms are localized to effusion of the ankle.  I do not see an indication for emergent ultrasound.        Final Clinical Impression(s) / ED Diagnoses Final diagnoses:  Right ankle swelling    Rx / DC Orders ED Discharge Orders          Ordered    methylPREDNISolone (MEDROL DOSEPAK) 4 MG TBPK tablet        05/08/23 1622              Terald Sleeper, MD 05/08/23 1623

## 2023-05-18 ENCOUNTER — Telehealth: Payer: Self-pay

## 2023-05-18 ENCOUNTER — Encounter (HOSPITAL_COMMUNITY)
Admission: RE | Admit: 2023-05-18 | Discharge: 2023-05-18 | Disposition: A | Discharge status: Home / Self Care | Payer: Medicare Other | Source: Ambulatory Visit | Attending: Nephrology | Admitting: Nephrology

## 2023-05-18 VITALS — BP 129/50 | HR 76 | Temp 97.8°F | Resp 17

## 2023-05-18 DIAGNOSIS — D631 Anemia in chronic kidney disease: Secondary | ICD-10-CM

## 2023-05-18 DIAGNOSIS — N189 Chronic kidney disease, unspecified: Secondary | ICD-10-CM

## 2023-05-18 LAB — POCT HEMOGLOBIN-HEMACUE: Hemoglobin: 11.1 g/dL — ABNORMAL LOW (ref 12.0–15.0)

## 2023-05-18 MED ORDER — EPOETIN ALFA-EPBX 10000 UNIT/ML IJ SOLN
10000.0000 [IU] | INTRAMUSCULAR | Status: DC
  Administered 2023-05-18: 10000 [IU] via SUBCUTANEOUS

## 2023-05-18 MED ORDER — EPOETIN ALFA-EPBX 10000 UNIT/ML IJ SOLN
INTRAMUSCULAR | Status: AC
  Filled 2023-05-18: qty 1

## 2023-05-18 NOTE — Telephone Encounter (Signed)
Transition Care Management Follow-up Telephone Call Date of discharge and from where: [facility] 5/19 How have you been since you were released from the hospital? Very well  Any questions or concerns? No  Items Reviewed: Did the pt receive and understand the discharge instructions provided? Yes  Medications obtained and verified? Yes  Other? No  Any new allergies since your discharge? No  Dietary orders reviewed? No Do you have support at home? Yes     Follow up appointments reviewed:  PCP Hospital f/u appt confirmed? Yes  Scheduled to see  on 5/30 @ . Specialist Hospital f/u appt confirmed?  Scheduled to see  on  @ . Are transportation arrangements needed? No  If their condition worsens, is the pt aware to call PCP or go to the Emergency Dept.? Yes Was the patient provided with contact information for the PCP's office or ED? Yes Was to pt encouraged to call back with questions or concerns? Yes

## 2023-05-19 ENCOUNTER — Ambulatory Visit (HOSPITAL_COMMUNITY)
Admission: RE | Admit: 2023-05-19 | Discharge: 2023-05-19 | Disposition: A | Discharge status: Home / Self Care | Payer: Medicare Other | Source: Ambulatory Visit | Attending: Family Medicine | Admitting: Family Medicine

## 2023-05-19 ENCOUNTER — Other Ambulatory Visit (HOSPITAL_COMMUNITY): Payer: Self-pay | Admitting: Family Medicine

## 2023-05-19 DIAGNOSIS — J441 Chronic obstructive pulmonary disease with (acute) exacerbation: Secondary | ICD-10-CM | POA: Diagnosis not present

## 2023-05-19 DIAGNOSIS — N058 Unspecified nephritic syndrome with other morphologic changes: Secondary | ICD-10-CM | POA: Diagnosis not present

## 2023-05-19 DIAGNOSIS — J449 Chronic obstructive pulmonary disease, unspecified: Secondary | ICD-10-CM | POA: Diagnosis not present

## 2023-05-19 DIAGNOSIS — N184 Chronic kidney disease, stage 4 (severe): Secondary | ICD-10-CM | POA: Diagnosis not present

## 2023-05-21 DIAGNOSIS — G4733 Obstructive sleep apnea (adult) (pediatric): Secondary | ICD-10-CM | POA: Diagnosis not present

## 2023-05-26 DIAGNOSIS — N185 Chronic kidney disease, stage 5: Secondary | ICD-10-CM | POA: Diagnosis not present

## 2023-05-27 ENCOUNTER — Encounter (HOSPITAL_COMMUNITY): Payer: Self-pay

## 2023-06-03 ENCOUNTER — Encounter: Payer: Self-pay | Admitting: Podiatrist

## 2023-06-03 ENCOUNTER — Ambulatory Visit (INDEPENDENT_AMBULATORY_CARE_PROVIDER_SITE_OTHER): Payer: Medicare Other

## 2023-06-03 ENCOUNTER — Ambulatory Visit (INDEPENDENT_AMBULATORY_CARE_PROVIDER_SITE_OTHER): Payer: Medicare Other | Admitting: Podiatrist

## 2023-06-03 DIAGNOSIS — E872 Acidosis, unspecified: Secondary | ICD-10-CM | POA: Diagnosis not present

## 2023-06-03 DIAGNOSIS — N058 Unspecified nephritic syndrome with other morphologic changes: Secondary | ICD-10-CM | POA: Diagnosis not present

## 2023-06-03 DIAGNOSIS — M7750 Other enthesopathy of unspecified foot: Secondary | ICD-10-CM

## 2023-06-03 DIAGNOSIS — M779 Enthesopathy, unspecified: Secondary | ICD-10-CM

## 2023-06-03 DIAGNOSIS — M109 Gout, unspecified: Secondary | ICD-10-CM | POA: Diagnosis not present

## 2023-06-03 DIAGNOSIS — E785 Hyperlipidemia, unspecified: Secondary | ICD-10-CM | POA: Diagnosis not present

## 2023-06-03 DIAGNOSIS — N185 Chronic kidney disease, stage 5: Secondary | ICD-10-CM | POA: Diagnosis not present

## 2023-06-03 DIAGNOSIS — M778 Other enthesopathies, not elsewhere classified: Secondary | ICD-10-CM

## 2023-06-03 DIAGNOSIS — N057 Unspecified nephritic syndrome with diffuse crescentic glomerulonephritis: Secondary | ICD-10-CM | POA: Diagnosis not present

## 2023-06-03 DIAGNOSIS — M7751 Other enthesopathy of right foot: Secondary | ICD-10-CM

## 2023-06-03 DIAGNOSIS — E875 Hyperkalemia: Secondary | ICD-10-CM | POA: Diagnosis not present

## 2023-06-03 DIAGNOSIS — I120 Hypertensive chronic kidney disease with stage 5 chronic kidney disease or end stage renal disease: Secondary | ICD-10-CM | POA: Diagnosis not present

## 2023-06-03 DIAGNOSIS — R942 Abnormal results of pulmonary function studies: Secondary | ICD-10-CM | POA: Diagnosis not present

## 2023-06-03 DIAGNOSIS — R768 Other specified abnormal immunological findings in serum: Secondary | ICD-10-CM | POA: Diagnosis not present

## 2023-06-03 DIAGNOSIS — N2581 Secondary hyperparathyroidism of renal origin: Secondary | ICD-10-CM | POA: Diagnosis not present

## 2023-06-03 DIAGNOSIS — D631 Anemia in chronic kidney disease: Secondary | ICD-10-CM | POA: Diagnosis not present

## 2023-06-03 NOTE — Patient Instructions (Signed)
Walking Boot, Adult  A walking boot holds your foot or ankle in place after an injury or a medical procedure. This helps with healing and prevents further injury. It has a hard, rigid outer frame that limits movement and supports your foot and leg. The inner lining is a layer of padded material. Walking boots also have adjustable straps to secure them over the foot and leg. A walking boot may be prescribed if you can put weight (bear weight) on your injured foot. How much you can walk while wearing the boot will depend on the type and severity of your injury. How to put on a walking boot There are different types of walking boots. Each type has specific instructions about how to wear it properly. Follow instructions from your health care provider, such as: Ask someone to help you put on the boot, if needed. Sit to put on your boot. Doing this is more comfortable and helps to prevent falls. Open up the boot fully. Place your foot into the boot so your heel rests against the back. Your toes should be supported by the base of the boot. They should not hang over the front edge. Adjust the straps so the boot fits securely but is not too tight. Do not bend the hard frame of the boot to get a good fit. How to walk with a walking boot How much you can walk will depend on your injury. Some tips for managing with a boot include: Do not try to walk without wearing the boot unless your health care provider approves. Use other assistive walking devices, including crutches or canes, as told by your health care provider. On your uninjured foot, wear a shoe with a heel that is close to the height of the walking boot. Be careful when walking on surfaces that are uneven or wet. How to reduce swelling while using a walking boot  Rest your injured foot or leg as much as possible. If directed, put ice on the injured area. To do this: Put ice in a plastic bag. Place a towel between your skin and the bag. Leave the  ice on for 20 minutes, 2-3 times a day. Remove the ice if your skin turns bright red. This is very important. If you cannot feel pain, heat, or cold, you have a greater risk of damage to the area. Keep your injured foot or leg raised (elevated) above the level of your heart for at least 2?3 hours each day or as told by your health care provider. If swelling gets worse, loosen the boot. Rest and elevate your foot and leg. How to care for your skin and foot while using a walking boot Wear a long sock to protect your foot and leg from rubbing inside the boot. Take off the boot one time each day to check the injured area. Check your foot, the surrounding skin, and your leg to make sure there are no sores, rashes, swelling, or wounds. The skin should be a healthy color, not pale or blue. Try to notice if your walking pattern (gait) in the boot is fairly normal and that you are walking without a noticeable limp. Follow instructions from your health care provider about taking care of your incision or wound, if this applies. Clean and wash the injured area as told by your health care provider. Gently dry your foot and leg before putting the boot back on. Removing your walking boot Follow directions from your health care provider for removing the   walking boot. Generally, it is okay to remove your walking boot: When you are resting or sleeping. To clean your foot and leg. How to keep the walking boot clean Do not put any part of the boot in a washing machine or dryer. Do not use chemical cleaning products. These could irritate your skin, especially if you have a wound or an incision. Do not soak the liner of the boot. Use a washcloth with mild soap and water to clean the frame and the liner of the boot by hand. Allow the boot to air-dry completely before you put it back on your foot. Follow these instructions at home: Activity Your activity will be restricted depending on the type and severity of your  injury. Follow instructions from your health care provider. Also: Bathe and shower as told by your health care provider. Do not do any activities that could make your injury worse. Do not drive if your affected foot is the one that you use for driving. Contact a health care provider if: The boot is cracked or damaged. The boot does not fit properly. Your foot or leg hurts. You have a rash, sore, or open sore (ulcer) on your foot or leg. The skin on your foot or leg is pale. You have a wound or incision on the foot and it is getting worse. Your skin becomes painful, red, or irritated. Your swelling does not get better or it gets worse. Get help right away if: You have numbness in your foot or leg. The skin on your foot or leg is cold, blue, or gray. Summary A walking boot holds your foot or ankle in place after an injury or a medical procedure. There are different types of walking boots. Follow instructions about how to correctly wear your boot. Ask someone to help you put on the boot, if needed. It is important to check your skin and foot every day. Call your health care provider if you notice a rash or sore on your foot or leg. This information is not intended to replace advice given to you by your health care provider. Make sure you discuss any questions you have with your health care provider. Document Revised: 09/29/2020 Document Reviewed: 09/29/2020 [employer] Patient Education  2024 [employer]  

## 2023-06-03 NOTE — Progress Notes (Unsigned)
Chief Complaint  Patient presents with   Foot Pain    "My foot hurts on the bottom in the arch area up into the sides." N - arch pain L - arch, heel, and lateral right D - 3 weeks O - suddenly, getting better C - throbs,  A - walking T - Tylenol      HPI: Patient is 77 y.o. female who presents today for pain in the right foot in the arch area and up the lateral aspect of the foot.  She denies any recent trauma or injury.  Denies twisting or stepping incorrectly.  Relates she woke up one day and her foot was painful.  Patient Active Problem List   Diagnosis Date Noted   Macular retinoschisis of left eye 10/01/2021   Posterior vitreous detachment of left eye 10/01/2021   Acute respiratory failure with hypoxia (HCC) 10/01/2021   LIP (lipoid interstitial pneumonia) (HCC) 06/30/2021   ILD (interstitial lung disease) (HCC) 06/30/2021   Excessive daytime sleepiness 05/22/2021   Macular hole of right eye 05/19/2021   Pseudophakia of both eyes 05/19/2021   Restrictive lung disease 05/11/2021   Diffusion capacity of lung (dl), decreased 40/98/1191   Vitreomacular traction syndrome, left 01/12/2021   Exudative age-related macular degeneration of left eye with active choroidal neovascularization (HCC) 10/13/2020   Abdominal pain, epigastric 08/29/2020   Anemia 06/20/2020   Anemia secondary to renal failure 06/04/2020   Glomerulonephritis 05/04/2020   ARF (acute renal failure) (HCC) 04/29/2020   AKI (acute kidney injury) (HCC) 03/28/2020   Herniated cervical intervertebral disc 11/19/2016   Cervical disc disorder at C5-C6 level with radiculopathy 10/21/2016   Gastric ulceration    GERD (gastroesophageal reflux disease) 05/11/2016   Constipation 05/11/2016   Hashimoto's thyroiditis 02/20/2016   History of colonic polyps    Diverticulosis of colon without hemorrhage    Complete tear of right rotator cuff 01/21/2015   Severe obesity (BMI >= 40) (HCC) 01/21/2015   Nausea without  vomiting 04/04/2013   H/O adenomatous polyp of colon 04/04/2013   Fatty liver 11/11/2010   Right sided abdominal pain 11/11/2010   NONSPEC ELEVATION OF LEVELS OF TRANSAMINASE/LDH 11/11/2010   COLONIC POLYPS, HYPERPLASTIC, HX OF 06/25/2010   ANEMIA-NOS 06/24/2010    Current Outpatient Medications on File Prior to Visit  Medication Sig Dispense Refill   acetaminophen (TYLENOL) 500 MG tablet Take 500-1,000 mg by mouth as needed (for pain/headache).      albuterol (VENTOLIN HFA) 108 (90 Base) MCG/ACT inhaler Inhale 2 puffs into the lungs every 6 (six) hours as needed for wheezing or shortness of breath. 18 g 2   amLODipine (NORVASC) 2.5 MG tablet Take 2.5 mg by mouth daily.     buPROPion (WELLBUTRIN XL) 300 MG 24 hr tablet Take 300 mg by mouth daily.     Cholecalciferol (VITAMIN D3) 125 MCG (5000 UT) CAPS Take 1 capsule by mouth daily.     cholecalciferol (VITAMIN D3) 25 MCG (1000 UNIT) tablet 1 tablet     clobetasol ointment (TEMOVATE) 0.05 %      clotrimazole-betamethasone (LOTRISONE) cream Apply topically.     Cyanocobalamin (VITAMIN B-12) 5000 MCG TBDP Take 1,000 mcg by mouth daily.      diclofenac sodium (VOLTAREN) 1 % GEL Apply 2-4 g topically 4 (four) times daily. 5 Tube 3   Docusate Sodium (DSS) 100 MG CAPS Take by mouth.     fluticasone (FLONASE) 50 MCG/ACT nasal spray Place 2 sprays into both nostrils daily as needed for  allergies or rhinitis.     furosemide (LASIX) 80 MG tablet as needed.     LINZESS 290 MCG CAPS capsule TAKE 1 CAPSULE BY MOUTH DAILY BEFORE BREAKFAST. 90 capsule 3   losartan (COZAAR) 25 MG tablet Take 12.5 mg by mouth at bedtime.     metoprolol succinate (TOPROL-XL) 50 MG 24 hr tablet Take 50 mg by mouth daily. Take with or immediately following a meal.     montelukast (SINGULAIR) 10 MG tablet Take 1 tablet (10 mg total) by mouth every morning. 30 tablet 2   nystatin (MYCOSTATIN) 100000 UNIT/ML suspension Take 5 mLs by mouth 2 (two) times daily as needed (for  thrush).      pantoprazole (PROTONIX) 40 MG tablet TAKE 1 TABLET BY MOUTH 1 OR 2 TIMES DAILY BEFORE A MEAL FOR REFLUX 180 tablet 1   polyethylene glycol (MIRALAX / GLYCOLAX) packet Take 17 g by mouth daily as needed for mild constipation or moderate constipation.     raloxifene (EVISTA) 60 MG tablet Take 60 mg by mouth daily.     colchicine 0.6 MG tablet Take 0.6 mg by mouth daily. (Patient not taking: Reported on 06/03/2023)     guaiFENesin (MUCINEX) 600 MG 12 hr tablet Take 1 tablet (600 mg total) by mouth 2 (two) times daily. (Patient not taking: Reported on 06/03/2023) 20 tablet 0   methylPREDNISolone (MEDROL DOSEPAK) 4 MG TBPK tablet Use as directed (Patient not taking: Reported on 06/03/2023) 21 tablet 0   ondansetron (ZOFRAN) 4 MG tablet Take 4 mg by mouth as needed for nausea or vomiting. (Patient not taking: Reported on 06/03/2023)     rOPINIRole (REQUIP) 0.25 MG tablet Take 0.75 mg by mouth daily as needed (for rls).  (Patient not taking: Reported on 06/03/2023)     rosuvastatin (CRESTOR) 10 MG tablet Take 10 mg by mouth at bedtime. (Patient not taking: Reported on 06/03/2023)     Vitamin D, Ergocalciferol, (DRISDOL) 1.25 MG (50000 UNIT) CAPS capsule Take 50,000 Units by mouth every 7 (seven) days. (Patient not taking: Reported on 06/03/2023)     No current facility-administered medications on file prior to visit.    Allergies  Allergen Reactions   Prednisone Other (See Comments)    Hallucinations    Tramadol Nausea Only   Aspirin Nausea And Vomiting   Celecoxib Rash    Review of Systems No fevers, chills, nausea, muscle aches, no difficulty breathing, no calf pain, no chest pain or shortness of breath.   Physical Exam  GENERAL APPEARANCE: Alert, conversant. Appropriately groomed. No acute distress.   VASCULAR: Pedal pulses palpable 2/4 DP and 2/4 PT bilateral.  Capillary refill time is immediate to all digits,  Proximal to distal cooling is warm to warm.  Digital perfusion  adequate.   NEUROLOGIC: sensation is intact to 5.07 monofilament at 5/5 sites bilateral.  Light touch is intact bilateral, vibratory sensation intact bilateral  MUSCULOSKELETAL: acceptable muscle strength, tone and stability bilateral.  No gross boney pedal deformities noted.  Pain along the course of the peroneus brevis and peroneus longus muscle and tendon right foot.  Pain along the mid arch central arch and posterior behind the lateral malleolus is palpated.  Pain at the fifth metatarsal base is also noted.  DERMATOLOGIC: skin is warm, supple, and dry.  Color, texture, and turgor of skin within normal limits.  No open wounds are noted.  No preulcerative lesions are seen.  Digital nails are asymptomatic.    X-ray evaluation 3 views of  the right foot are obtained.  No sign of fracture or dislocation is noted.  Fifth metatarsal cortices noted to be intact and again no sign of fracture is seen.  No increased soft tissue density is seen.  No radiopaque foreign bodies are noted.  Slight bunion deformity noted.    Assessment     ICD-10-CM   1. Tendonitis of ankle or foot  M77.50 DG Foot Complete Right       Plan  Discussed exam findings and treatment recommendations.  At this time I recommended boot immobilization for the right foot.  Short air fracture walker was dispensed and she was given instructions for wear.  I discussed that she may continue to drive however she needs to remove the boot and wear a good supportive sneaker when driving.  She will be seen back in 3 to 4 weeks for recheck and if any problems questions or concerns arise prior to that visit she will call.

## 2023-06-07 ENCOUNTER — Encounter (HOSPITAL_COMMUNITY): Payer: Self-pay

## 2023-06-08 ENCOUNTER — Encounter (HOSPITAL_COMMUNITY): Payer: Self-pay

## 2023-06-15 ENCOUNTER — Ambulatory Visit (HOSPITAL_COMMUNITY)
Admission: RE | Admit: 2023-06-15 | Discharge: 2023-06-15 | Disposition: A | Discharge status: Home / Self Care | Payer: Medicare Other | Source: Ambulatory Visit | Attending: Nephrology | Admitting: Nephrology

## 2023-06-15 VITALS — BP 144/73 | HR 88 | Temp 97.0°F | Resp 17

## 2023-06-15 DIAGNOSIS — D631 Anemia in chronic kidney disease: Secondary | ICD-10-CM

## 2023-06-15 DIAGNOSIS — N189 Chronic kidney disease, unspecified: Secondary | ICD-10-CM | POA: Diagnosis not present

## 2023-06-15 LAB — IRON AND TIBC
Iron: 93 ug/dL (ref 28–170)
Saturation Ratios: 29 % (ref 10.4–31.8)
TIBC: 322 ug/dL (ref 250–450)
UIBC: 229 ug/dL

## 2023-06-15 LAB — FERRITIN: Ferritin: 593 ng/mL — ABNORMAL HIGH (ref 11–307)

## 2023-06-15 LAB — POCT HEMOGLOBIN-HEMACUE: Hemoglobin: 11.2 g/dL — ABNORMAL LOW (ref 12.0–15.0)

## 2023-06-15 MED ORDER — EPOETIN ALFA-EPBX 10000 UNIT/ML IJ SOLN
10000.0000 [IU] | INTRAMUSCULAR | Status: DC
  Administered 2023-06-15: 10000 [IU] via SUBCUTANEOUS

## 2023-06-15 MED ORDER — EPOETIN ALFA-EPBX 10000 UNIT/ML IJ SOLN
INTRAMUSCULAR | Status: AC
  Filled 2023-06-15: qty 1

## 2023-06-20 DIAGNOSIS — G4733 Obstructive sleep apnea (adult) (pediatric): Secondary | ICD-10-CM | POA: Diagnosis not present

## 2023-07-07 DIAGNOSIS — N185 Chronic kidney disease, stage 5: Secondary | ICD-10-CM | POA: Diagnosis not present

## 2023-07-12 DIAGNOSIS — N057 Unspecified nephritic syndrome with diffuse crescentic glomerulonephritis: Secondary | ICD-10-CM | POA: Diagnosis not present

## 2023-07-12 DIAGNOSIS — N185 Chronic kidney disease, stage 5: Secondary | ICD-10-CM | POA: Diagnosis not present

## 2023-07-12 DIAGNOSIS — M109 Gout, unspecified: Secondary | ICD-10-CM | POA: Diagnosis not present

## 2023-07-12 DIAGNOSIS — R768 Other specified abnormal immunological findings in serum: Secondary | ICD-10-CM | POA: Diagnosis not present

## 2023-07-12 DIAGNOSIS — N2581 Secondary hyperparathyroidism of renal origin: Secondary | ICD-10-CM | POA: Diagnosis not present

## 2023-07-12 DIAGNOSIS — R942 Abnormal results of pulmonary function studies: Secondary | ICD-10-CM | POA: Diagnosis not present

## 2023-07-12 DIAGNOSIS — N058 Unspecified nephritic syndrome with other morphologic changes: Secondary | ICD-10-CM | POA: Diagnosis not present

## 2023-07-12 DIAGNOSIS — E875 Hyperkalemia: Secondary | ICD-10-CM | POA: Diagnosis not present

## 2023-07-12 DIAGNOSIS — E872 Acidosis, unspecified: Secondary | ICD-10-CM | POA: Diagnosis not present

## 2023-07-12 DIAGNOSIS — E785 Hyperlipidemia, unspecified: Secondary | ICD-10-CM | POA: Diagnosis not present

## 2023-07-12 DIAGNOSIS — D631 Anemia in chronic kidney disease: Secondary | ICD-10-CM | POA: Diagnosis not present

## 2023-07-12 DIAGNOSIS — I120 Hypertensive chronic kidney disease with stage 5 chronic kidney disease or end stage renal disease: Secondary | ICD-10-CM | POA: Diagnosis not present

## 2023-07-13 ENCOUNTER — Ambulatory Visit (HOSPITAL_COMMUNITY)
Admission: EM | Admit: 2023-07-13 | Discharge: 2023-07-13 | Disposition: A | Discharge status: Home / Self Care | Payer: BC Managed Care – PPO

## 2023-07-13 ENCOUNTER — Encounter (HOSPITAL_COMMUNITY): Payer: Self-pay | Admitting: Emergency Medicine

## 2023-07-13 ENCOUNTER — Ambulatory Visit (HOSPITAL_COMMUNITY)
Admission: RE | Admit: 2023-07-13 | Discharge: 2023-07-13 | Disposition: A | Discharge status: Home / Self Care | Payer: Medicare Other | Source: Ambulatory Visit | Attending: Nephrology | Admitting: Nephrology

## 2023-07-13 VITALS — BP 127/73 | HR 81 | Temp 97.3°F | Resp 17

## 2023-07-13 DIAGNOSIS — D631 Anemia in chronic kidney disease: Secondary | ICD-10-CM | POA: Diagnosis not present

## 2023-07-13 DIAGNOSIS — R519 Headache, unspecified: Secondary | ICD-10-CM | POA: Diagnosis not present

## 2023-07-13 DIAGNOSIS — N189 Chronic kidney disease, unspecified: Secondary | ICD-10-CM | POA: Insufficient documentation

## 2023-07-13 LAB — IRON AND TIBC
Iron: 68 ug/dL (ref 28–170)
Saturation Ratios: 22 % (ref 10.4–31.8)
TIBC: 308 ug/dL (ref 250–450)
UIBC: 240 ug/dL

## 2023-07-13 LAB — FERRITIN: Ferritin: 554 ng/mL — ABNORMAL HIGH (ref 11–307)

## 2023-07-13 LAB — POCT HEMOGLOBIN-HEMACUE: Hemoglobin: 11.2 g/dL — ABNORMAL LOW (ref 12.0–15.0)

## 2023-07-13 MED ORDER — EPOETIN ALFA-EPBX 10000 UNIT/ML IJ SOLN
INTRAMUSCULAR | Status: AC
  Administered 2023-07-13: 10000 [IU] via SUBCUTANEOUS
  Filled 2023-07-13: qty 1

## 2023-07-13 MED ORDER — EPOETIN ALFA-EPBX 10000 UNIT/ML IJ SOLN: 10000.0000 [IU] | INTRAMUSCULAR | Status: DC
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
## 2023-07-19 ENCOUNTER — Emergency Department (HOSPITAL_COMMUNITY)
Admission: EM | Admit: 2023-07-19 | Discharge: 2023-07-19 | Disposition: A | Discharge status: Home / Self Care | Payer: Medicare Other | Attending: Emergency Medicine | Admitting: Emergency Medicine

## 2023-07-19 ENCOUNTER — Other Ambulatory Visit: Payer: Self-pay

## 2023-07-19 ENCOUNTER — Encounter (HOSPITAL_COMMUNITY): Payer: Self-pay | Admitting: *Deleted

## 2023-07-19 DIAGNOSIS — M79675 Pain in left toe(s): Secondary | ICD-10-CM | POA: Insufficient documentation

## 2023-07-19 DIAGNOSIS — M10371 Gout due to renal impairment, right ankle and foot: Secondary | ICD-10-CM | POA: Insufficient documentation

## 2023-07-19 DIAGNOSIS — M109 Gout, unspecified: Secondary | ICD-10-CM | POA: Diagnosis not present

## 2023-07-19 DIAGNOSIS — M10372 Gout due to renal impairment, left ankle and foot: Secondary | ICD-10-CM

## 2023-07-19 HISTORY — DX: Gout, unspecified: M10.9

## 2023-07-19 MED ORDER — PREDNISONE 20 MG PO TABS
40.0000 mg | ORAL_TABLET | Freq: Once | ORAL | Status: AC
  Administered 2023-07-19: 40 mg via ORAL
  Filled 2023-07-19: qty 2

## 2023-07-19 MED ORDER — HYDROCODONE-ACETAMINOPHEN 5-325 MG PO TABS: 1.0000 | ORAL_TABLET | Freq: Four times a day (QID) | ORAL | 0 refills | Status: DC | PRN

## 2023-07-19 MED ORDER — PREDNISONE 20 MG PO TABS: 40.0000 mg | ORAL_TABLET | Freq: Every day | ORAL | 0 refills | Status: AC

## 2023-07-19 NOTE — Discharge Instructions (Signed)
Pleasure taking care of you today.  Your evaluated for pain in your left great toe.  This seems to be due to a gout flare.  We are giving a prescription for steroids for the inflammation and Norco for pain.  Follow-up closely with your primary care doctor.  Come back to the ER for new or worsening symptoms.

## 2023-07-19 NOTE — ED Provider Notes (Signed)
[place] EMERGENCY DEPARTMENT AT [facility] Provider Note   CSN: [id number] Arrival date & time: 07/19/23  1509     History  Chief Complaint  Patient presents with   Foot Pain    Cheryl [name] is a 77 y.o. female.  She has history of kidney disease and gout.  Presents the ER today complaining of left great toe pain for the past couple of days.  Started suddenly, no trauma, feels exactly prior episodes of gout.  Denies fever or chills.   Foot Pain       Home Medications Prior to Admission medications   Medication Sig Start Date End Date Taking? Authorizing Provider  HYDROcodone-acetaminophen (NORCO) 5-325 MG tablet Take 1 tablet by mouth every 6 (six) hours as needed for moderate pain. 07/19/23  Yes Nachum Derossett A, PA-C  predniSONE (DELTASONE) 20 MG tablet Take 2 tablets (40 mg total) by mouth daily for 4 days. 07/20/23 07/24/23 Yes Traxton Kolenda A, PA-C  acetaminophen (TYLENOL) 500 MG tablet Take 500-1,000 mg by mouth as needed (for pain/headache).     [provider]  albuterol (VENTOLIN HFA) 108 (90 Base) MCG/ACT inhaler Inhale 2 puffs into the lungs every 6 (six) hours as needed for wheezing or shortness of breath. 01/06/22   Luciano Cutter, MD  amLODipine (NORVASC) 2.5 MG tablet Take 2.5 mg by mouth daily. 04/27/21   [provider]  buPROPion (WELLBUTRIN XL) 300 MG 24 hr tablet Take 300 mg by mouth daily. 05/15/19   [provider]  Cholecalciferol (VITAMIN D3) 125 MCG (5000 UT) CAPS Take 1 capsule by mouth daily.    [provider]  cholecalciferol (VITAMIN D3) 25 MCG (1000 UNIT) tablet 1 tablet    [provider]  clobetasol ointment (TEMOVATE) 0.05 %  12/22/20   [provider]  clotrimazole-betamethasone (LOTRISONE) cream Apply topically.    [provider]  colchicine 0.6 MG tablet Take 0.6 mg by mouth daily. Patient not taking: Reported on 06/03/2023    [provider]  Cyanocobalamin  (VITAMIN B-12) 5000 MCG TBDP Take 1,000 mcg by mouth daily.     [provider]  diclofenac sodium (VOLTAREN) 1 % GEL Apply 2-4 g topically 4 (four) times daily. 08/02/17   Jetty Peeks, PA-C  Docusate Sodium (DSS) 100 MG CAPS Take by mouth.    [provider]  fluticasone (FLONASE) 50 MCG/ACT nasal spray Place 2 sprays into both nostrils daily as needed for allergies or rhinitis.    [provider]  furosemide (LASIX) 80 MG tablet as needed. 05/26/20   [provider]  guaiFENesin (MUCINEX) 600 MG 12 hr tablet Take 1 tablet (600 mg total) by mouth 2 (two) times daily. Patient not taking: Reported on 06/03/2023 03/29/20   Shon Hale, MD  LINZESS 290 MCG CAPS capsule TAKE 1 CAPSULE BY MOUTH DAILY BEFORE BREAKFAST. 10/29/20   Letta Median, PA-C  losartan (COZAAR) 25 MG tablet Take 12.5 mg by mouth at bedtime.    [provider]  methylPREDNISolone (MEDROL DOSEPAK) 4 MG TBPK tablet Use as directed Patient not taking: Reported on 06/03/2023 05/08/23   Terald Sleeper, MD  metoprolol succinate (TOPROL-XL) 50 MG 24 hr tablet Take 50 mg by mouth daily. Take with or immediately following a meal.    [provider]  montelukast (SINGULAIR) 10 MG tablet Take 1 tablet (10 mg total) by mouth every morning. 03/29/20   Shon Hale, MD  nystatin (MYCOSTATIN) 100000 UNIT/ML  suspension Take 5 mLs by mouth 2 (two) times daily as needed (for thrush).  02/22/20   [provider]  ondansetron (ZOFRAN) 4 MG tablet Take 4 mg by mouth as needed for nausea or vomiting. Patient not taking: Reported on 06/03/2023 04/25/20   [provider]  pantoprazole (PROTONIX) 40 MG tablet TAKE 1 TABLET BY MOUTH 1 OR 2 TIMES DAILY BEFORE A MEAL FOR REFLUX 12/15/21   Gelene Mink, NP  polyethylene glycol [facility] / GLYCOLAX) packet Take 17 g by mouth daily as needed for mild constipation or moderate constipation.    [provider]  raloxifene  (EVISTA) 60 MG tablet Take 60 mg by mouth daily.    [provider]  rOPINIRole (REQUIP) 0.25 MG tablet Take 0.75 mg by mouth daily as needed (for rls).  Patient not taking: Reported on 06/03/2023 12/12/19   [provider]  rosuvastatin (CRESTOR) 10 MG tablet Take 10 mg by mouth at bedtime. Patient not taking: Reported on 06/03/2023    [provider]  Vitamin D, Ergocalciferol, (DRISDOL) 1.25 MG (50000 UNIT) CAPS capsule Take 50,000 Units by mouth every 7 (seven) days. Patient not taking: Reported on 06/03/2023    [provider]      Allergies    Prednisone, Tramadol, Aspirin, and Celecoxib    Review of Systems   Review of Systems  Physical Exam Updated Vital Signs BP (!) 125/113 (BP Location: Right Wrist)   Pulse 84   Temp 98.9 F (37.2 C) (Oral)   Resp 12   Ht 4\' 11"  (1.499 m)   Wt 97.5 kg   SpO2 92%   BMI 43.42 kg/m  Physical Exam Vitals and nursing note reviewed.  Constitutional:      General: She is not in acute distress.    Appearance: She is well-developed.  HENT:     Head: Normocephalic and atraumatic.     Mouth/Throat:     Mouth: Mucous membranes are moist.  Eyes:     Conjunctiva/sclera: Conjunctivae normal.  Cardiovascular:     Rate and Rhythm: Normal rate and regular rhythm.     Heart sounds: No murmur heard. Pulmonary:     Effort: Pulmonary effort is normal. No respiratory distress.     Breath sounds: Normal breath sounds.  Abdominal:     Palpations: Abdomen is soft.     Tenderness: There is no abdominal tenderness.  Musculoskeletal:        General: No swelling.     Cervical back: Neck supple.     Comments: Swelling and tenderness at left first MTP joint with mild hyperemia over the lateral aspect.  DP and PT pulses intact of the left foot.  Capillary refill is brisk in the toes.  The MTP joint is exquisitely tender with any movement or palpation  Skin:    General: Skin is warm and dry.     Capillary Refill:  Capillary refill takes less than 2 seconds.  Neurological:     General: No focal deficit present.     Mental Status: She is alert and oriented to person, place, and time.  Psychiatric:        Mood and Affect: Mood normal.     ED Results / Procedures / Treatments   Labs (all labs ordered are listed, but only abnormal results are displayed) Labs Reviewed - No data to display  EKG None  Radiology No results found.  Procedures Procedures    Medications Ordered in ED Medications  predniSONE (  DELTASONE) tablet 40 mg (has no administration in time range)    ED Course/ Medical Decision Making/ A&P                                 Medical Decision Making DDx: Fracture, gout, pseudogout, contusion, cellulitis, other  ED course: Patient has history of gout and kidney disease, having pain and swelling to the left first MTP joint that feels exactly prior flares of gout.  Given her renal disease will avoid NSAIDs and after steroids.  She states she has allergy to prednisone but states that this is only reaction that causes hallucinations of very high doses for prolonged periods when she was first diagnosed with kidney disease, states she is taking steroids before for shorter periods without problem.  Will give her Norco for analgesics.  She declines dose in the ED because she wants to go home and eat something before she takes it.  Risk Prescription drug management.           Final Clinical Impression(s) / ED Diagnoses Final diagnoses:  Acute gout due to renal impairment involving toe of left foot    Rx / DC Orders ED Discharge Orders          Ordered    predniSONE (DELTASONE) 20 MG tablet  Daily        07/19/23 1723    HYDROcodone-acetaminophen (NORCO) 5-325 MG tablet  Every 6 hours PRN        07/19/23 1723              Ma Rings, PA-C 07/19/23 1729    Rondel Baton, MD 07/20/23 1227

## 2023-07-19 NOTE — ED Triage Notes (Signed)
Pt with pain to left foot since Saturday. Pt concerned she has gout, pt states hx of gout. Pt has tried tylenol at home.

## 2023-07-21 DIAGNOSIS — G4733 Obstructive sleep apnea (adult) (pediatric): Secondary | ICD-10-CM | POA: Diagnosis not present

## 2023-08-01 ENCOUNTER — Encounter: Payer: Self-pay | Admitting: *Deleted

## 2023-08-02 ENCOUNTER — Ambulatory Visit (HOSPITAL_BASED_OUTPATIENT_CLINIC_OR_DEPARTMENT_OTHER)
Admission: RE | Admit: 2023-08-02 | Discharge: 2023-08-02 | Disposition: A | Discharge status: Home / Self Care | Payer: Medicare Other | Source: Ambulatory Visit | Attending: Pulmonary Disease | Admitting: Pulmonary Disease

## 2023-08-02 DIAGNOSIS — J691 Pneumonitis due to inhalation of oils and essences: Secondary | ICD-10-CM | POA: Insufficient documentation

## 2023-08-02 DIAGNOSIS — J439 Emphysema, unspecified: Secondary | ICD-10-CM | POA: Diagnosis not present

## 2023-08-02 DIAGNOSIS — R918 Other nonspecific abnormal finding of lung field: Secondary | ICD-10-CM | POA: Diagnosis not present

## 2023-08-02 DIAGNOSIS — J84112 Idiopathic pulmonary fibrosis: Secondary | ICD-10-CM | POA: Diagnosis not present

## 2023-08-02 DIAGNOSIS — J849 Interstitial pulmonary disease, unspecified: Secondary | ICD-10-CM | POA: Diagnosis not present

## 2023-08-10 ENCOUNTER — Ambulatory Visit (HOSPITAL_COMMUNITY)
Admission: RE | Admit: 2023-08-10 | Discharge: 2023-08-10 | Disposition: A | Discharge status: Home / Self Care | Payer: Medicare Other | Source: Ambulatory Visit | Attending: Nephrology | Admitting: Nephrology

## 2023-08-10 VITALS — BP 139/77 | HR 72 | Temp 98.2°F | Resp 17

## 2023-08-10 DIAGNOSIS — N189 Chronic kidney disease, unspecified: Secondary | ICD-10-CM | POA: Insufficient documentation

## 2023-08-10 DIAGNOSIS — D631 Anemia in chronic kidney disease: Secondary | ICD-10-CM | POA: Diagnosis not present

## 2023-08-10 LAB — FERRITIN: Ferritin: 843 ng/mL — ABNORMAL HIGH (ref 11–307)

## 2023-08-10 LAB — IRON AND TIBC
Iron: 71 ug/dL (ref 28–170)
Saturation Ratios: 21 % (ref 10.4–31.8)
TIBC: 336 ug/dL (ref 250–450)
UIBC: 265 ug/dL

## 2023-08-10 LAB — POCT HEMOGLOBIN-HEMACUE: Hemoglobin: 12.2 g/dL (ref 12.0–15.0)

## 2023-08-10 MED ORDER — EPOETIN ALFA-EPBX 10000 UNIT/ML IJ SOLN: 10000.0000 [IU] | INTRAMUSCULAR | Status: DC

## 2023-08-16 ENCOUNTER — Ambulatory Visit (HOSPITAL_COMMUNITY)
Admission: EM | Admit: 2023-08-16 | Discharge: 2023-08-16 | Disposition: A | Discharge status: Home / Self Care | Payer: Medicare Other | Attending: Emergency Medicine | Admitting: Emergency Medicine

## 2023-08-16 ENCOUNTER — Ambulatory Visit (INDEPENDENT_AMBULATORY_CARE_PROVIDER_SITE_OTHER): Payer: Medicare Other

## 2023-08-16 ENCOUNTER — Encounter (HOSPITAL_COMMUNITY): Payer: Self-pay

## 2023-08-16 DIAGNOSIS — M109 Gout, unspecified: Secondary | ICD-10-CM

## 2023-08-16 DIAGNOSIS — M25572 Pain in left ankle and joints of left foot: Secondary | ICD-10-CM | POA: Diagnosis not present

## 2023-08-16 LAB — BASIC METABOLIC PANEL
Anion gap: 12 (ref 5–15)
BUN: 34 mg/dL — ABNORMAL HIGH (ref 8–23)
CO2: 20 mmol/L — ABNORMAL LOW (ref 22–32)
Calcium: 9.2 mg/dL (ref 8.9–10.3)
Chloride: 108 mmol/L (ref 98–111)
Creatinine, Ser: 3.75 mg/dL — ABNORMAL HIGH (ref 0.44–1.00)
GFR, Estimated: 12 mL/min — ABNORMAL LOW (ref >60)
Glucose, Bld: 89 mg/dL (ref 70–99)
Potassium: 4.6 mmol/L (ref 3.5–5.1)
Sodium: 140 mmol/L (ref 135–145)

## 2023-08-16 MED ORDER — PREDNISONE 20 MG PO TABS: 40.0000 mg | ORAL_TABLET | Freq: Every day | ORAL | 0 refills | Status: AC

## 2023-08-16 NOTE — ED Provider Notes (Signed)
HPI  SUBJECTIVE:  Cheryl [name] is a 77 y.o. female who presents with ***    Past Medical History:  Diagnosis Date   Acid reflux    Arthritis    CKD (chronic kidney disease)    Constipation    GERD (gastroesophageal reflux disease)    Gout    Macular retinoschisis of right eye 02/23/2021   Neuromuscular disorder (HCC)    PUD (peptic ulcer disease) 2017   gastric ulcer healed on repeat EGD in August 2017   Sinus drainage    Sleep apnea    had surgery to correct   Vitreomacular traction syndrome, with secondary hole formation 01/12/2021   Vitrectomy membrane peel gas injection right eye 05-27-2021    Past Surgical History:  Procedure Laterality Date   ANTERIOR CERVICAL DECOMP/DISCECTOMY FUSION N/A 11/19/2016   Procedure: ANTERIOR CERVICAL DISCECTOMY AND FUSION C5-6 WITH PLATES, SCREWS, CAGE, VIVIGEN II;  Surgeon: Kerrin Champagne, MD;  Location: [facility];  Service: Orthopedics;  Laterality: N/A;   CARPAL TUNNEL RELEASE     CATARACT EXTRACTION, BILATERAL Bilateral 2017   One in December 2017 and one in January 2018   COLONOSCOPY  02/15/2005   [id number] small polyps ablated via cold biopsy, one from transverse colon and two from the rectum/Small external hemorrhoids   COLONOSCOPY  07/10/2010   [id number] rectum/long redundant colon, polyps in the sigmoid, descending, hepatic flexure/ADENOMATOUS POLYPS. next TCS due 06/2015   COLONOSCOPY  1995   3 polyps, path revealed chronic colitis   COLONOSCOPY N/A 08/05/2015   Procedure: COLONOSCOPY;  Surgeon: Corbin Ade, MD; melanosis coli, colonic diverticulosis. Repeat colonoscopy in 5 years for surveillance.   COLONOSCOPY N/A 09/03/2020   Procedure: COLONOSCOPY;  Surgeon: Corbin Ade, MD;  Location: [facility];  Service: Endoscopy;  Laterality: N/A;  11:15am   ESOPHAGOGASTRODUODENOSCOPY  1995   Gastritis   ESOPHAGOGASTRODUODENOSCOPY N/A 04/16/2013   RMR: [facility]   ESOPHAGOGASTRODUODENOSCOPY N/A 05/13/2016   Procedure:  ESOPHAGOGASTRODUODENOSCOPY (EGD);  Surgeon: Corbin Ade, MD; gastric ulcer and erosions s/p biopsy, erosive gastropathy. Path of reactive and regenerative changes, no H. pylori.   ESOPHAGOGASTRODUODENOSCOPY N/A 08/18/2016   Procedure: ESOPHAGOGASTRODUODENOSCOPY (EGD);  Surgeon: Corbin Ade, MD; normal esophagus, previous gastric ulcer completely healed, small hiatal hernia, normal duodenum.   KNEE SURGERY     x2   POLYPECTOMY  09/03/2020   Procedure: POLYPECTOMY;  Surgeon: Corbin Ade, MD;  Location: [facility];  Service: Endoscopy;;   ROTATOR CUFF REPAIR     x2   SHOULDER ARTHROSCOPY WITH ROTATOR CUFF REPAIR AND SUBACROMIAL DECOMPRESSION Right 01/21/2015   Procedure: RIGHT SHOULDER ARTHROSCOPIC DEBRIDEMNT OF G-H JOINT AND REMOVAL OF LOOSE BODIES,ARTHROSCOPIC SUBACROMIAL DECOMPRESSION,MINI OPEN RCT REPAIR WITH SUPPLEMENTAL [facility] PATCH;  Surgeon: Valeria Batman, MD;  Location: [facility] OR;  Service: Orthopedics;  Laterality: Right;   TONSILLECTOMY     TRIGGER FINGER RELEASE     UVULOPALATOPHARYNGOPLASTY      Family History  Problem Relation Age of Onset   Cancer Mother    Cancer Father    Diabetes Sister    Colon cancer Neg Hx    Liver disease Neg Hx     Social History   Tobacco Use   Smoking status: Former    Current packs/day: 0.00    Average packs/day: 0.1 packs/day for 48.0 years (4.8 ttl pk-yrs)    Types: Cigarettes    Start date: 05/11/1957    Quit date: 05/11/2005    Years since quitting:  18.2    Passive exposure: Never   Smokeless tobacco: Never   Tobacco comments:    smoked less than 1 pack a week  Vaping Use   Vaping status: Never Used  Substance Use Topics   Alcohol use: No    Alcohol/week: 0.0 standard drinks of alcohol   Drug use: No    No current facility-administered medications for this encounter.  Current Outpatient Medications:    predniSONE (DELTASONE) 20 MG tablet, Take 2 tablets (40 mg total) by mouth daily with breakfast for 5 days.,  Disp: 10 tablet, Rfl: 0   acetaminophen (TYLENOL) 500 MG tablet, Take 500-1,000 mg by mouth as needed (for pain/headache). , Disp: , Rfl:    albuterol (VENTOLIN HFA) 108 (90 Base) MCG/ACT inhaler, Inhale 2 puffs into the lungs every 6 (six) hours as needed for wheezing or shortness of breath., Disp: 18 g, Rfl: 2   amLODipine (NORVASC) 2.5 MG tablet, Take 2.5 mg by mouth daily., Disp: , Rfl:    buPROPion (WELLBUTRIN XL) 300 MG 24 hr tablet, Take 300 mg by mouth daily., Disp: , Rfl:    Cholecalciferol (VITAMIN D3) 125 MCG (5000 UT) CAPS, Take 1 capsule by mouth daily., Disp: , Rfl:    cholecalciferol (VITAMIN D3) 25 MCG (1000 UNIT) tablet, 1 tablet, Disp: , Rfl:    clobetasol ointment (TEMOVATE) 0.05 %, , Disp: , Rfl:    clotrimazole-betamethasone (LOTRISONE) cream, Apply topically., Disp: , Rfl:    colchicine 0.6 MG tablet, Take 0.6 mg by mouth daily. (Patient not taking: Reported on 06/03/2023), Disp: , Rfl:    Cyanocobalamin (VITAMIN B-12) 5000 MCG TBDP, Take 1,000 mcg by mouth daily. , Disp: , Rfl:    Docusate Sodium (DSS) 100 MG CAPS, Take by mouth., Disp: , Rfl:    fluticasone (FLONASE) 50 MCG/ACT nasal spray, Place 2 sprays into both nostrils daily as needed for allergies or rhinitis., Disp: , Rfl:    furosemide (LASIX) 80 MG tablet, as needed., Disp: , Rfl:    guaiFENesin (MUCINEX) 600 MG 12 hr tablet, Take 1 tablet (600 mg total) by mouth 2 (two) times daily. (Patient not taking: Reported on 06/03/2023), Disp: 20 tablet, Rfl: 0   HYDROcodone-acetaminophen (NORCO) 5-325 MG tablet, Take 1 tablet by mouth every 6 (six) hours as needed for moderate pain., Disp: 10 tablet, Rfl: 0   LINZESS 290 MCG CAPS capsule, TAKE 1 CAPSULE BY MOUTH DAILY BEFORE BREAKFAST., Disp: 90 capsule, Rfl: 3   losartan (COZAAR) 25 MG tablet, Take 12.5 mg by mouth at bedtime., Disp: , Rfl:    metoprolol succinate (TOPROL-XL) 50 MG 24 hr tablet, Take 50 mg by mouth daily. Take with or immediately following a meal., Disp: ,  Rfl:    montelukast (SINGULAIR) 10 MG tablet, Take 1 tablet (10 mg total) by mouth every morning., Disp: 30 tablet, Rfl: 2   nystatin (MYCOSTATIN) 100000 UNIT/ML suspension, Take 5 mLs by mouth 2 (two) times daily as needed (for thrush). , Disp: , Rfl:    ondansetron (ZOFRAN) 4 MG tablet, Take 4 mg by mouth as needed for nausea or vomiting. (Patient not taking: Reported on 06/03/2023), Disp: , Rfl:    pantoprazole (PROTONIX) 40 MG tablet, TAKE 1 TABLET BY MOUTH 1 OR 2 TIMES DAILY BEFORE A MEAL FOR REFLUX, Disp: 180 tablet, Rfl: 1   polyethylene glycol (MIRALAX / GLYCOLAX) packet, Take 17 g by mouth daily as needed for mild constipation or moderate constipation., Disp: , Rfl:    raloxifene (EVISTA)  60 MG tablet, Take 60 mg by mouth daily., Disp: , Rfl:    rOPINIRole (REQUIP) 0.25 MG tablet, Take 0.75 mg by mouth daily as needed (for rls).  (Patient not taking: Reported on 06/03/2023), Disp: , Rfl:    rosuvastatin (CRESTOR) 10 MG tablet, Take 10 mg by mouth at bedtime. (Patient not taking: Reported on 06/03/2023), Disp: , Rfl:    Vitamin D, Ergocalciferol, (DRISDOL) 1.25 MG (50000 UNIT) CAPS capsule, Take 50,000 Units by mouth every 7 (seven) days. (Patient not taking: Reported on 06/03/2023), Disp: , Rfl:   Allergies  Allergen Reactions   Prednisone Other (See Comments)    Hallucinations    Tramadol Nausea Only   Aspirin Nausea And Vomiting   Celecoxib Rash     ROS  As noted in HPI.   Physical Exam  BP (!) 144/85 (BP Location: Right Arm)   Pulse 74   Temp 98.7 F (37.1 C) (Oral)   Resp 17   SpO2 100%   Constitutional: Well developed, well nourished, no acute distress Eyes:  EOMI, conjunctiva normal bilaterally HENT: Normocephalic, atraumatic,mucus membranes moist Respiratory: Normal inspiratory effort Cardiovascular: Normal rate GI: nondistended skin: No rash, skin intact Musculoskeletal:  Left midfoot tender. Base of fifth metatarsal tender.  Diffuse swelling left foot.   Calcaneus NT. no bruising. Skin intact. DP 2+. Refill less than 2 seconds. Sensation grossly intact. Patient able to move all toes actively.  no pain with inversion / eversion,  dorsiflexion / plantarflexion. No Tenderness along the plantar fascia. Distal fibula NT, Medial malleolus NT,  Deltoid ligament NT, Lateral ligaments NT, Achilles NT. Patient able to bear weight with a walker while in department.  Neurologic: Alert & oriented x 3, no focal neuro deficits Psychiatric: Speech and behavior appropriate   ED Course   Medications - No data to display  Orders Placed This Encounter  Procedures   DG Foot Complete Left    Standing Status:   Standing    Number of Occurrences:   1    Order Specific Question:   Reason for Exam (SYMPTOM  OR DIAGNOSIS REQUIRED)    Answer:   Midfoot, fifth metatarsal tenderness.  History of gout.  Rule out acute fracture   Basic metabolic panel    Standing Status:   Standing    Number of Occurrences:   1    No results found for this or any previous visit (from the past 24 hour(s)). No results found.  ED Clinical Impression  1. Acute gout of left foot, unspecified cause      ED Assessment/Plan   {The patient has been seen in Urgent Care in the last 3 years. :1}  Will image foot to rule out any acute fractures since this has been an ongoing issue.  Will also check a basic metabolic panel as I am able to find a record of a BMP/kidney function since 2023 although patient states that she gets her kidney function checked monthly by her nephrologist.  Plan to send home on 40 mg prednisone for 5 days.  She does not want colchicine.  Tylenol.  She states that she can take short courses of low-dose prednisone even though prednisone is listed as an allergy.  Advised her to talk with her PCP about allopurinol.  Reviewed imaging independently.  No fractures read by me.  Formal radiology report pending.  Will call patient if read differs enough from my read and we  need to change management.     Home with  prednisone.  No NSAIDs because of kidney function.  Tylenol 1000 g 3 times daily patient states she cannot take colchicine.  Advised her to discuss allopurinol with her PCP.    Discussed labs, imaging, MDM, treatment plan, and plan for follow-up with patient. patient agrees with plan.   Meds ordered this encounter  Medications   predniSONE (DELTASONE) 20 MG tablet    Sig: Take 2 tablets (40 mg total) by mouth daily with breakfast for 5 days.    Dispense:  10 tablet    Refill:  0      *This clinic note was created using Scientist, clinical (histocompatibility and immunogenetics). Therefore, there may be occasional mistakes despite careful proofreading.  ?

## 2023-08-16 NOTE — ED Triage Notes (Signed)
Pt presents with c/o gout flare up that has been going on for months.  States she has medicine to for relief, states it has stopped working.

## 2023-08-16 NOTE — Discharge Instructions (Signed)
Talk to your primary care provider about starting allopurinol, which can prevent further gout flares.  Take 1000 g Tylenol 3 times a day.  Finish the prednisone, even if you feel better.  We will contact you if the radiology read comes back different enough from my read that we need to change management.  I did not see any fracture on your x-ray.

## 2023-08-17 ENCOUNTER — Encounter (HOSPITAL_COMMUNITY): Payer: Self-pay

## 2023-08-21 DIAGNOSIS — G4733 Obstructive sleep apnea (adult) (pediatric): Secondary | ICD-10-CM | POA: Diagnosis not present

## 2023-08-25 ENCOUNTER — Encounter (HOSPITAL_COMMUNITY): Payer: BC Managed Care – PPO

## 2023-08-26 ENCOUNTER — Encounter (HOSPITAL_BASED_OUTPATIENT_CLINIC_OR_DEPARTMENT_OTHER): Payer: Self-pay | Admitting: Pulmonary Disease

## 2023-08-26 ENCOUNTER — Ambulatory Visit (INDEPENDENT_AMBULATORY_CARE_PROVIDER_SITE_OTHER): Payer: Medicare Other | Admitting: Pulmonary Disease

## 2023-08-26 ENCOUNTER — Ambulatory Visit (HOSPITAL_BASED_OUTPATIENT_CLINIC_OR_DEPARTMENT_OTHER): Payer: Medicare Other | Admitting: Pulmonary Disease

## 2023-08-26 VITALS — BP 120/72 | HR 57 | Resp 18 | Ht 59.0 in | Wt 214.0 lb

## 2023-08-26 DIAGNOSIS — J849 Interstitial pulmonary disease, unspecified: Secondary | ICD-10-CM

## 2023-08-26 DIAGNOSIS — G4733 Obstructive sleep apnea (adult) (pediatric): Secondary | ICD-10-CM

## 2023-08-26 DIAGNOSIS — N185 Chronic kidney disease, stage 5: Secondary | ICD-10-CM | POA: Diagnosis not present

## 2023-08-26 DIAGNOSIS — J691 Pneumonitis due to inhalation of oils and essences: Secondary | ICD-10-CM

## 2023-08-26 LAB — PULMONARY FUNCTION TEST
DL/VA % pred: 100 %
DL/VA: 4.28 ml/min/mmHg/L
DLCO unc % pred: 57 %
DLCO unc: 9.18 ml/min/mmHg
FEF 25-75 Post: 2.31 L/s
FEF 25-75 Pre: 2.36 L/s
FEF2575-%Change-Post: -2 %
FEF2575-%Pred-Post: 167 %
FEF2575-%Pred-Pre: 170 %
FEV1-%Change-Post: 4 %
FEV1-%Pred-Post: 79 %
FEV1-%Pred-Pre: 76 %
FEV1-Post: 1.32 L
FEV1-Pre: 1.26 L
FEV1FVC-%Change-Post: 4 %
FEV1FVC-%Pred-Pre: 119 %
FEV6-%Change-Post: 0 %
FEV6-%Pred-Post: 67 %
FEV6-%Pred-Pre: 67 %
FEV6-Post: 1.41 L
FEV6-Pre: 1.41 L
FEV6FVC-%Pred-Post: 105 %
FEV6FVC-%Pred-Pre: 105 %
FVC-%Change-Post: 0 %
FVC-%Pred-Post: 63 %
FVC-%Pred-Pre: 63 %
FVC-Post: 1.41 L
FVC-Pre: 1.41 L
Post FEV1/FVC ratio: 93 %
Post FEV6/FVC ratio: 100 %
Pre FEV1/FVC ratio: 90 %
Pre FEV6/FVC Ratio: 100 %

## 2023-08-26 NOTE — Progress Notes (Signed)
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  Synopsis: Diagnosed with pauci immune necrotizing glomerulonephritis and started on cytoxan 04/2020. On evaluation for kidney transplant, PFTs were incidentally found with restrictive defect with reduced DLCO.  At baseline, she is active and able to perform her ADLs. She does have some shortness of breath with heavy exertion but does not feel this limits her activity. Denies shortness of breath, cough or wheezing when ambulating within house or when performing tasks including grocery shopping or yardwork unless it requires heavy lifting.  On prior visit she did report she has a history of sleep apnea s/p tonsillectomy >10 years ago. She was unable to tolerate CPAP. Currently she is snoring at night. Unsure if she has any apneic episodes. Reports decreased energy, excessive daytime sleepiness/drowsiness. Denies gasping or choking episodes at night but does awaken due to nocturia. Her sleep hygiene is poor and often watches TV into the night.  01/06/22 Since our last visit, she reports completing her sleep studies but she is not currently on CPAP. CPAP titration on 10/13/21 was documented to be completed but no report available. She has some dyspnea on heavy exertion but denies any symptoms of dyspnea, cough or wheezing at baseline when performing her ADLs.   04/04/22 Since her last visit she has been to [facility] for pretransplant evaluation.  Note by Dr. Su Hilt on 03/04/2022 reviewed.  Her main risk factor is her age, BMI, and ILD. She has also been counseled on options for dialysis access by her nephrologist but not yet on dialysis.  Rarely uses her albuterol once a month at most  04/02/22 Overall doing well. No respiratory symptoms. Denies shortness of breath, cough or wheezing.  02/16/23 Since our last visit, denies respiratory symptoms. Denies shortness of breath, cough or wheezing. She is overall compliant with CPAP however was ill last month. Average 4 hours nightly. Her sleep is irregular. Is interested in oral compliance device  08/26/23  Since our last visit no respiratory issues. Denies shortness of breath, cough or wheezing. Does not wearing CPAP. Has received oral appliance and has been tolerating. Has been having story like dreams and sleeping well. Has been off Rituxan for >1 year. Last dose around August 2023.  Social History: Smoked 1/4 ppd since she was 47. Quit in 2006.  Environmental exposures:  Retired Surveyor, mining. Wood stove at home which was recently taken out in 2021. >30 years  Past Medical History:  Diagnosis Date   Acid reflux    Arthritis    CKD (chronic kidney disease)    Constipation    GERD (gastroesophageal reflux disease)    Gout    Macular retinoschisis of right eye 02/23/2021   Neuromuscular disorder (HCC)    PUD (peptic ulcer disease) 2017   gastric ulcer healed on repeat EGD in August 2017   Sinus drainage    Sleep apnea    had surgery to correct   Vitreomacular traction syndrome, with secondary hole formation 01/12/2021   Vitrectomy membrane peel gas injection right eye 05-27-2021    Allergies  Allergen Reactions   Prednisone Other (See Comments)    Hallucinations  Tramadol Nausea Only   Aspirin Nausea And Vomiting   Celecoxib Rash     Outpatient Medications Prior to Visit  Medication Sig Dispense Refill   acetaminophen (TYLENOL) 500 MG tablet Take 500-1,000 mg by mouth as needed (for pain/headache).      albuterol (VENTOLIN HFA) 108 (90 Base) MCG/ACT inhaler Inhale 2 puffs into the lungs every 6 (six) hours as needed for wheezing or shortness of breath. 18 g 2    amLODipine (NORVASC) 2.5 MG tablet Take 2.5 mg by mouth daily.     buPROPion (WELLBUTRIN XL) 300 MG 24 hr tablet Take 300 mg by mouth daily.     Cholecalciferol (VITAMIN D3) 125 MCG (5000 UT) CAPS Take 1 capsule by mouth daily.     cholecalciferol (VITAMIN D3) 25 MCG (1000 UNIT) tablet 1 tablet     clobetasol ointment (TEMOVATE) 0.05 %      clotrimazole-betamethasone (LOTRISONE) cream Apply topically.     colchicine 0.6 MG tablet Take 0.6 mg by mouth daily.     Cyanocobalamin (VITAMIN B-12) 5000 MCG TBDP Take 1,000 mcg by mouth daily.      Docusate Sodium (DSS) 100 MG CAPS Take by mouth.     fluticasone (FLONASE) 50 MCG/ACT nasal spray Place 2 sprays into both nostrils daily as needed for allergies or rhinitis.     furosemide (LASIX) 80 MG tablet as needed.     guaiFENesin (MUCINEX) 600 MG 12 hr tablet Take 1 tablet (600 mg total) by mouth 2 (two) times daily. 20 tablet 0   HYDROcodone-acetaminophen (NORCO) 5-325 MG tablet Take 1 tablet by mouth every 6 (six) hours as needed for moderate pain. 10 tablet 0   LINZESS 290 MCG CAPS capsule TAKE 1 CAPSULE BY MOUTH DAILY BEFORE BREAKFAST. 90 capsule 3   losartan (COZAAR) 25 MG tablet Take 12.5 mg by mouth at bedtime.     metoprolol succinate (TOPROL-XL) 50 MG 24 hr tablet Take 50 mg by mouth daily. Take with or immediately following a meal.     montelukast (SINGULAIR) 10 MG tablet Take 1 tablet (10 mg total) by mouth every morning. 30 tablet 2   nystatin (MYCOSTATIN) 100000 UNIT/ML suspension Take 5 mLs by mouth 2 (two) times daily as needed (for thrush).      ondansetron (ZOFRAN) 4 MG tablet Take 4 mg by mouth as needed for nausea or vomiting.     pantoprazole (PROTONIX) 40 MG tablet TAKE 1 TABLET BY MOUTH 1 OR 2 TIMES DAILY BEFORE A MEAL FOR REFLUX 180 tablet 1   polyethylene glycol (MIRALAX / GLYCOLAX) packet Take 17 g by mouth daily as needed for mild constipation or moderate constipation.     raloxifene (EVISTA) 60 MG tablet Take 60 mg by  mouth daily.     rOPINIRole (REQUIP) 0.25 MG tablet Take 0.75 mg by mouth daily as needed (for rls).     rosuvastatin (CRESTOR) 10 MG tablet Take 10 mg by mouth at bedtime.     Vitamin D, Ergocalciferol, (DRISDOL) 1.25 MG (50000 UNIT) CAPS capsule Take 50,000 Units by mouth every 7 (seven) days.     No facility-administered medications prior to visit.    Review of Systems  Constitutional:  Negative for chills, diaphoresis, fever, malaise/fatigue and weight loss.  HENT:  Negative for congestion.   Respiratory:  Negative for cough, hemoptysis, sputum production, shortness of breath and wheezing.   Cardiovascular:  Negative for chest pain, palpitations and leg swelling.     Objective:   Vitals:  08/26/23 1014  BP: 120/72  Pulse: (!) 57  Resp: 18  SpO2: 99%  Weight: 214 lb (97.1 kg)  Height: 4\' 11"  (1.499 m)   SpO2: 99 %  Body mass index is 43.22 kg/m.  Physical Exam: General: Well-appearing, no acute distress HENT: [place], AT Eyes: EOMI, no scleral icterus Respiratory: Clear to auscultation bilaterally.  No crackles, wheezing or rales Cardiovascular: RRR, -M/R/G, no JVD Extremities:-Edema,-tenderness Neuro: AAO x4, CNII-XII grossly intact Psych: Normal mood, normal affect   Data Reviewed:  Imaging: CT A/P Lung fields 02/19/12 Bibasilar atelectasis. Scattered small blebs CXR 04/19/20 - Stable bilateral pulmonary opacities, linear scarring in lingular CT Chest 05/23/21 - Scattered thin walled cysts bilaterally, minimal bibasilar fibrosis. Background emphysema CT Chest 06/23/22 - Mild basilar predominant GGO and reticular opacities with scattered thin wall cysts. No progression CT Chest 08/26/23 - Mildly progressive lower lobe reticular opacities  PFT: 05/11/21 FVC  1.49 (53%) FEV1 1.36 (63%) Ratio 91  TLC 60% DLCO 41% Interpretation: Moderate restrictive defect with moderately reduced diffusing capacity  01/06/22 FVC 1.31 (75%) FEV1 1.03 (77%) Ratio 73  TLC 69% DLCO  55% Interpretation: Mild restrictive defect with moderately reduced diffusing capacity, improved values  02/16/23 FVC 1.40 (61%) FEV1 1.32 (78%) Ratio 92  TLC 72% DLCO 64% Interpretation: Mild restrictive defect with mildly reduced DLCO  08/26/23 FVC 1.41 (63%) FEV1 1.32 (79%) Ratio 90  DLCO 57% Interpretation: No obstructive defect. Reduced FEV1 and FVC suggest restrictive defect. Worsening DLCO compared to 02/16/23.   Sleep study: 08/11/21- AHI 44/hr. SpO2 nadir 69% PAP titration 10/12/21 - Trial of CPAP therapy on 15 cm H2O or autopap 10-20.  CPAP Compliance 12/17/22-01/25/23 Usage days 46/60 (77%) >4 hours 37 days (62%) CPAP 5-20 AHI 3.7 Assessment & Plan:   Discussion: 77 year old female former smoker (5 pack years) with LIP, pauci immune necrotizing glomerulonephritis, stage V CKD who presents for follow-up. Asymptomatic. PFTs reviewed with worsening DLCO. Her restrictive defect likely combination of LIP and morbid obesity.  LIP/Interstitial lung disease - mildly worsening CT and worsening DLCO  Labs were neg: HIV, immunoglobulins, RF, anti-CCP Stable radiographic findings >1 year --Reviewed PFTs.  Stable mild restrictive defect with worsening DLCO  --CONTINUE Albuterol as needed for shortness of breath or wheezing --Will discuss with Dr. Malen Gauze, Nephrology, regarding her immunosuppressant regimen. Currently off but may likely need to restart --Monitor with annual CT Chest without contrast and repeat PFTs in 6 months (March)   Severe OSA --Previously on auto CPAP 5-20 mm Hg  --Currently using oral compliance provided by Dr. Althea Grimmer at [phone] --Will repeat home sleep study to determine effectiveness. Will call regarding sleep test results and if adjustment on oral device needed  Health Maintenance Immunization History  Administered Date(s) Administered   Influenza, High Dose Seasonal PF 09/19/2017, 10/02/2018, 10/16/2019   Influenza-Unspecified 10/08/2021   Moderna  Sars-Covid-2 Vaccination 02/01/2020, 10/13/2020, 12/29/2020   Pneumococcal Conjugate-13 10/02/2017   Zoster Recombinant(Shingrix) 10/02/2018, 01/11/2019   CT Lung Screen - not indicated  Orders Placed This Encounter  Procedures   Home sleep test    Standing Status:   Future    Standing Expiration Date:   08/25/2024    Order Specific Question:   Where should this test be performed:    Answer:   [facility]   No orders of the defined types were placed in this encounter.  Return in about 6 months (around 02/23/2024) for after PFT.  I have spent a total time of 35-minutes  on the day of the appointment including chart review, data review, collecting history, coordinating care and discussing medical diagnosis and plan with the patient/family. Past medical history, allergies, medications were reviewed. Pertinent imaging, labs and tests included in this note have been reviewed and interpreted independently by me.  Areg Bialas Mechele Collin, MD [facility] 08/26/2023 10:48 AM  Office Number [phone]

## 2023-08-26 NOTE — Patient Instructions (Signed)
LIP/Interstitial lung disease - mildly worsening CT and worsening DLCO  Labs were neg: HIV, immunoglobulins, RF, anti-CCP Stable radiographic findings >1 year --Reviewed PFTs.  Stable mild restrictive defect with worsening DLCO  --CONTINUE Albuterol as needed for shortness of breath or wheezing --Will discuss with Dr. Malen Gauze, Nephrology, regarding her immunosuppressant regimen. Currently off but may likely need to restart --Monitor with annual CT Chest without contrast and repeat PFTs in 6 months (March)   Severe OSA --Previously on auto CPAP 5-20 mm Hg  --Currently using oral compliance provided by Dr. Althea Grimmer at [phone] --Will repeat home sleep study to determine effectiveness. Will call regarding sleep test results and if adjustment on oral device needed

## 2023-08-26 NOTE — Progress Notes (Signed)
Attempted full PFT; Performed Pre/Post Spirometry & DLCO today;

## 2023-08-26 NOTE — Patient Instructions (Signed)
Attempted full PFT; Performed Pre/Post Spirometry & DLCO today;

## 2023-08-30 DIAGNOSIS — R768 Other specified abnormal immunological findings in serum: Secondary | ICD-10-CM | POA: Diagnosis not present

## 2023-08-30 DIAGNOSIS — E785 Hyperlipidemia, unspecified: Secondary | ICD-10-CM | POA: Diagnosis not present

## 2023-08-30 DIAGNOSIS — M109 Gout, unspecified: Secondary | ICD-10-CM | POA: Diagnosis not present

## 2023-08-30 DIAGNOSIS — N057 Unspecified nephritic syndrome with diffuse crescentic glomerulonephritis: Secondary | ICD-10-CM | POA: Diagnosis not present

## 2023-08-30 DIAGNOSIS — N2581 Secondary hyperparathyroidism of renal origin: Secondary | ICD-10-CM | POA: Diagnosis not present

## 2023-08-30 DIAGNOSIS — I120 Hypertensive chronic kidney disease with stage 5 chronic kidney disease or end stage renal disease: Secondary | ICD-10-CM | POA: Diagnosis not present

## 2023-08-30 DIAGNOSIS — E875 Hyperkalemia: Secondary | ICD-10-CM | POA: Diagnosis not present

## 2023-08-30 DIAGNOSIS — R942 Abnormal results of pulmonary function studies: Secondary | ICD-10-CM | POA: Diagnosis not present

## 2023-08-30 DIAGNOSIS — N185 Chronic kidney disease, stage 5: Secondary | ICD-10-CM | POA: Diagnosis not present

## 2023-08-30 DIAGNOSIS — E872 Acidosis, unspecified: Secondary | ICD-10-CM | POA: Diagnosis not present

## 2023-08-30 DIAGNOSIS — D631 Anemia in chronic kidney disease: Secondary | ICD-10-CM | POA: Diagnosis not present

## 2023-08-30 DIAGNOSIS — N058 Unspecified nephritic syndrome with other morphologic changes: Secondary | ICD-10-CM | POA: Diagnosis not present

## 2023-09-06 ENCOUNTER — Ambulatory Visit (INDEPENDENT_AMBULATORY_CARE_PROVIDER_SITE_OTHER): Payer: Medicare Other | Admitting: Internal Medicine

## 2023-09-06 ENCOUNTER — Encounter: Payer: Self-pay | Admitting: Internal Medicine

## 2023-09-06 VITALS — BP 134/69 | HR 75 | Temp 98.4°F | Ht 59.0 in | Wt 216.8 lb

## 2023-09-06 DIAGNOSIS — Z86010 Personal history of colonic polyps: Secondary | ICD-10-CM | POA: Diagnosis not present

## 2023-09-06 DIAGNOSIS — K5909 Other constipation: Secondary | ICD-10-CM | POA: Diagnosis not present

## 2023-09-06 DIAGNOSIS — K219 Gastro-esophageal reflux disease without esophagitis: Secondary | ICD-10-CM | POA: Diagnosis not present

## 2023-09-06 DIAGNOSIS — G4730 Sleep apnea, unspecified: Secondary | ICD-10-CM | POA: Diagnosis not present

## 2023-09-06 NOTE — Progress Notes (Unsigned)
Primary Care Physician:  Ladon Applebaum Primary Gastroenterologist:  Dr. Jena Gauss  Pre-Procedure History & Physical: HPI:  Cheryl [name] is a 77 y.o. female here for  follow-up of GERD.  Chronic constipation well-managed with Linzess 290 daily.  If she misses a single dose she gets constipated.  History of multiple colonic adenomas removed for colon 2021; due for surveillance examination at this time.  Prior EGD demonstrated normal esophagus 2017.  Patient denies dysphagia.   She does require twice daily Protonix for good control.  Otherwise, no alarm symptoms.  Past Medical History:  Diagnosis Date   Acid reflux    Arthritis    CKD (chronic kidney disease)    Constipation    GERD (gastroesophageal reflux disease)    Gout    Macular retinoschisis of right eye 02/23/2021   Neuromuscular disorder (HCC)    PUD (peptic ulcer disease) 2017   gastric ulcer healed on repeat EGD in August 2017   Sinus drainage    Sleep apnea    had surgery to correct   Vitreomacular traction syndrome, with secondary hole formation 01/12/2021   Vitrectomy membrane peel gas injection right eye 05-27-2021    Past Surgical History:  Procedure Laterality Date   ANTERIOR CERVICAL DECOMP/DISCECTOMY FUSION N/A 11/19/2016   Procedure: ANTERIOR CERVICAL DISCECTOMY AND FUSION C5-6 WITH PLATES, SCREWS, CAGE, VIVIGEN II;  Surgeon: Kerrin Champagne, MD;  Location: [facility] OR;  Service: Orthopedics;  Laterality: N/A;   CARPAL TUNNEL RELEASE     CATARACT EXTRACTION, BILATERAL Bilateral 2017   One in December 2017 and one in January 2018   COLONOSCOPY  02/15/2005   [id number] small polyps ablated via cold biopsy, one from transverse colon and two from the rectum/Small external hemorrhoids   COLONOSCOPY  07/10/2010   QIH:[id number] rectum/long redundant colon, polyps in the sigmoid, descending, hepatic flexure/ADENOMATOUS POLYPS. next TCS due 06/2015   COLONOSCOPY  1995   3 polyps, path revealed chronic colitis    COLONOSCOPY N/A 08/05/2015   Procedure: COLONOSCOPY;  Surgeon: Corbin Ade, MD; melanosis coli, colonic diverticulosis. Repeat colonoscopy in 5 years for surveillance.   COLONOSCOPY N/A 09/03/2020   Procedure: COLONOSCOPY;  Surgeon: Corbin Ade, MD;  Location: [facility];  Service: Endoscopy;  Laterality: N/A;  11:15am   ESOPHAGOGASTRODUODENOSCOPY  1995   Gastritis   ESOPHAGOGASTRODUODENOSCOPY N/A 04/16/2013   RMR: [facility]   ESOPHAGOGASTRODUODENOSCOPY N/A 05/13/2016   Procedure: ESOPHAGOGASTRODUODENOSCOPY (EGD);  Surgeon: Corbin Ade, MD; gastric ulcer and erosions s/p biopsy, erosive gastropathy. Path of reactive and regenerative changes, no H. pylori.   ESOPHAGOGASTRODUODENOSCOPY N/A 08/18/2016   Procedure: ESOPHAGOGASTRODUODENOSCOPY (EGD);  Surgeon: Corbin Ade, MD; normal esophagus, previous gastric ulcer completely healed, small hiatal hernia, normal duodenum.   KNEE SURGERY     x2   POLYPECTOMY  09/03/2020   Procedure: POLYPECTOMY;  Surgeon: Corbin Ade, MD;  Location: [facility];  Service: Endoscopy;;   ROTATOR CUFF REPAIR     x2   SHOULDER ARTHROSCOPY WITH ROTATOR CUFF REPAIR AND SUBACROMIAL DECOMPRESSION Right 01/21/2015   Procedure: RIGHT SHOULDER ARTHROSCOPIC DEBRIDEMNT OF G-H JOINT AND REMOVAL OF LOOSE BODIES,ARTHROSCOPIC SUBACROMIAL DECOMPRESSION,MINI OPEN RCT REPAIR WITH SUPPLEMENTAL [facility] PATCH;  Surgeon: Valeria Batman, MD;  Location: [facility] OR;  Service: Orthopedics;  Laterality: Right;   TONSILLECTOMY     TRIGGER FINGER RELEASE     UVULOPALATOPHARYNGOPLASTY      Prior to Admission medications   Medication Sig Start Date End Date Taking?  Authorizing Provider  acetaminophen (TYLENOL) 500 MG tablet Take 500-1,000 mg by mouth as needed (for pain/headache).    Yes [provider]  albuterol (VENTOLIN HFA) 108 (90 Base) MCG/ACT inhaler Inhale 2 puffs into the lungs every 6 (six) hours as needed for wheezing or shortness of breath. 01/06/22  Yes Luciano Cutter, MD  allopurinol (ZYLOPRIM) 100 MG tablet Take 100 mg by mouth daily. 09/02/23  Yes [provider]  amLODipine (NORVASC) 2.5 MG tablet Take 2.5 mg by mouth daily. 04/27/21  Yes [provider]  buPROPion (WELLBUTRIN XL) 300 MG 24 hr tablet Take 300 mg by mouth daily. 05/15/19  Yes [provider]  calcitRIOL (ROCALTROL) 0.25 MCG capsule Take 0.25 mcg by mouth daily. 09/04/23  Yes [provider]  clobetasol ointment (TEMOVATE) 0.05 %  12/22/20  Yes [provider]  clotrimazole-betamethasone (LOTRISONE) cream Apply topically.   Yes [provider]  colchicine 0.6 MG tablet Take 0.6 mg by mouth daily as needed.   Yes [provider]  Cyanocobalamin (VITAMIN B-12) 5000 MCG TBDP Take 1,000 mcg by mouth daily.    Yes [provider]  Docusate Sodium (DSS) 100 MG CAPS Take by mouth.   Yes [provider]  fluticasone (FLONASE) 50 MCG/ACT nasal spray Place 2 sprays into both nostrils daily as needed for allergies or rhinitis.   Yes [provider]  furosemide (LASIX) 80 MG tablet as needed. 05/26/20  Yes [provider]  guaiFENesin (MUCINEX) 600 MG 12 hr tablet Take 1 tablet (600 mg total) by mouth 2 (two) times daily. 03/29/20  Yes Emokpae, Courage, MD  LINZESS 290 MCG CAPS capsule TAKE 1 CAPSULE BY MOUTH DAILY BEFORE BREAKFAST. 10/29/20  Yes Ermalinda Memos S, PA-C  losartan (COZAAR) 25 MG tablet Take 12.5 mg by mouth at bedtime.   Yes [provider]  metoprolol succinate (TOPROL-XL) 50 MG 24 hr tablet Take 50 mg by mouth daily. Take with or immediately following a meal.   Yes [provider]  montelukast (SINGULAIR) 10 MG tablet Take 1 tablet (10 mg total) by mouth every morning. 03/29/20  Yes Emokpae, Courage, MD  nystatin (MYCOSTATIN) 100000 UNIT/ML suspension Take 5 mLs by mouth 2 (two) times daily as needed (for thrush).  02/22/20  Yes [provider]  ondansetron (ZOFRAN) 4  MG tablet Take 4 mg by mouth as needed for nausea or vomiting. 04/25/20  Yes [provider]  pantoprazole (PROTONIX) 40 MG tablet TAKE 1 TABLET BY MOUTH 1 OR 2 TIMES DAILY BEFORE A MEAL FOR REFLUX 12/15/21  Yes Gelene Mink, NP  polyethylene glycol (MIRALAX / GLYCOLAX) packet Take 17 g by mouth daily as needed for mild constipation or moderate constipation.   Yes [provider]  raloxifene (EVISTA) 60 MG tablet Take 60 mg by mouth daily.   Yes [provider]  VELTASSA 8.4 g packet SMARTSIG:1 Packet(s) By Mouth 3 Times a Week 08/24/23  Yes [provider]    Allergies as of 09/06/2023 - Review Complete 09/06/2023  Allergen Reaction Noted   Prednisone Other (See Comments) 04/04/2013   Tramadol Nausea Only 01/21/2015   Aspirin Nausea And Vomiting    Celecoxib Rash     Family History  Problem Relation Age of Onset   Cancer Mother    Cancer Father    Diabetes Sister    Colon cancer Neg Hx    Liver disease Neg Hx     Social History   Socioeconomic  History   Marital status: Married    Spouse name: Not on file   Number of children: 2   Years of education: Not on file   Highest education level: Not on file  Occupational History   Occupation: BUS DRIVER    Employer: [employer]  Tobacco Use   Smoking status: Former    Current packs/day: 0.00    Average packs/day: 0.1 packs/day for 48.0 years (4.8 ttl pk-yrs)    Types: Cigarettes    Start date: 05/11/1957    Quit date: 05/11/2005    Years since quitting: 18.3    Passive exposure: Never   Smokeless tobacco: Never   Tobacco comments:    smoked less than 1 pack a week  Vaping Use   Vaping status: Never Used  Substance and Sexual Activity   Alcohol use: No    Alcohol/week: 0.0 standard drinks of alcohol   Drug use: No   Sexual activity: Not on file  Other Topics Concern   Not on file  Social History Narrative   Not on file   Social Determinants of Health   Financial Resource  Strain: Not on file  Food Insecurity: Not on file  Transportation Needs: Not on file  Physical Activity: Not on file  Stress: Not on file  Social Connections: Not on file  Intimate Partner Violence: Not on file    Review of Systems: See HPI, otherwise negative ROS  Physical Exam: BP 134/69 (BP Location: Right Arm, Patient Position: Sitting, Cuff Size: Large)   Pulse 75   Temp 98.4 F (36.9 C) (Oral)   Ht 4\' 11"  (1.499 m)   Wt 216 lb 12.8 oz (98.3 kg)   BMI 43.79 kg/m  General:   Alert,  Well-developed, well-nourished, pleasant and cooperative in NAD Lungs:  Clear throughout to auscultation.   No wheezes, crackles, or rhonchi. No acute distress. Heart:  Regular rate and rhythm; no murmurs, clicks, rubs,  or gallops. Abdomen: Non-distended, normal bowel sounds.  Soft and nontender without appreciable mass or hepatosplenomegaly.    Impression/Plan:    77 year old lady with chronic constipation managed well with Linzess daily.  GERD well-controlled on twice daily PPI therapy without any alarm features.    History of multiple colonic adenomas removed 2021; due for what may be 1 more surveillance colonoscopy now.    Recommendations:  We will schedule a surveillance colonoscopy (history of colonic polyps) ASA 3.  The risks, benefits, limitations, alternatives and imponderables have been reviewed with the patient. Questions have been answered. All parties are agreeable.    Continue Linzess daily up until the day before your colonoscopy  Continue Protonix 40 mg daily  Further recommendations to follow.     Notice: This dictation was prepared with Dragon dictation along with smaller phrase technology. Any transcriptional errors that result from this process are unintentional and may not be corrected upon review.

## 2023-09-06 NOTE — Patient Instructions (Signed)
It was good to see you again today!  As discussed, we will schedule a surveillance colonoscopy (history of colonic polyps) ASA 3  Continue Linzess daily up until the day before your colonoscopy  Continue Protonix 40 mg daily  Further recommendations to follow.

## 2023-09-07 ENCOUNTER — Encounter: Payer: Self-pay | Admitting: *Deleted

## 2023-09-07 ENCOUNTER — Telehealth: Payer: Self-pay | Admitting: *Deleted

## 2023-09-07 ENCOUNTER — Encounter (HOSPITAL_COMMUNITY): Payer: BC Managed Care – PPO

## 2023-09-07 ENCOUNTER — Encounter (HOSPITAL_BASED_OUTPATIENT_CLINIC_OR_DEPARTMENT_OTHER): Payer: Self-pay | Admitting: Pulmonary Disease

## 2023-09-07 ENCOUNTER — Other Ambulatory Visit: Payer: Self-pay | Admitting: *Deleted

## 2023-09-07 MED ORDER — PEG 3350-KCL-NA BICARB-NACL 420 G PO SOLR: 4000.0000 mL | Freq: Once | ORAL | 0 refills | Status: AC

## 2023-09-07 NOTE — Telephone Encounter (Signed)
[employer] PA:  Notification or Prior Authorization is not required for the requested services You are not required to submit a notification/prior authorization based on the information provided. If you have general questions about the prior authorization requirements, visit [email or web address] > Clinician Resources > Advance and Admission Notification Requirements. The number above acknowledges your notification. Please write this reference number down for future reference. If you would like to request an organization determination, please call us at [phone]. Decision ID #: [id number]

## 2023-09-08 DIAGNOSIS — Z0001 Encounter for general adult medical examination with abnormal findings: Secondary | ICD-10-CM | POA: Diagnosis not present

## 2023-09-08 DIAGNOSIS — N058 Unspecified nephritic syndrome with other morphologic changes: Secondary | ICD-10-CM | POA: Diagnosis not present

## 2023-09-08 DIAGNOSIS — G4733 Obstructive sleep apnea (adult) (pediatric): Secondary | ICD-10-CM | POA: Diagnosis not present

## 2023-09-08 DIAGNOSIS — J449 Chronic obstructive pulmonary disease, unspecified: Secondary | ICD-10-CM | POA: Diagnosis not present

## 2023-09-08 DIAGNOSIS — E782 Mixed hyperlipidemia: Secondary | ICD-10-CM | POA: Diagnosis not present

## 2023-09-08 DIAGNOSIS — N184 Chronic kidney disease, stage 4 (severe): Secondary | ICD-10-CM | POA: Diagnosis not present

## 2023-09-10 ENCOUNTER — Encounter (HOSPITAL_COMMUNITY): Payer: Self-pay

## 2023-09-20 DIAGNOSIS — G4733 Obstructive sleep apnea (adult) (pediatric): Secondary | ICD-10-CM | POA: Diagnosis not present

## 2023-09-27 DIAGNOSIS — G4733 Obstructive sleep apnea (adult) (pediatric): Secondary | ICD-10-CM | POA: Diagnosis not present

## 2023-10-03 DIAGNOSIS — H353131 Nonexudative age-related macular degeneration, bilateral, early dry stage: Secondary | ICD-10-CM | POA: Diagnosis not present

## 2023-10-03 DIAGNOSIS — H43812 Vitreous degeneration, left eye: Secondary | ICD-10-CM | POA: Diagnosis not present

## 2023-10-03 DIAGNOSIS — H353222 Exudative age-related macular degeneration, left eye, with inactive choroidal neovascularization: Secondary | ICD-10-CM | POA: Diagnosis not present

## 2023-10-03 DIAGNOSIS — G4733 Obstructive sleep apnea (adult) (pediatric): Secondary | ICD-10-CM | POA: Diagnosis not present

## 2023-10-03 DIAGNOSIS — H35341 Macular cyst, hole, or pseudohole, right eye: Secondary | ICD-10-CM | POA: Diagnosis not present

## 2023-10-07 ENCOUNTER — Ambulatory Visit (INDEPENDENT_AMBULATORY_CARE_PROVIDER_SITE_OTHER): Payer: Medicare Other

## 2023-10-07 DIAGNOSIS — Z23 Encounter for immunization: Secondary | ICD-10-CM

## 2023-10-07 NOTE — Progress Notes (Signed)
After obtaining consent, and per orders of Dr. Elvera Lennox, injection of INfuenza given by Pollie Meyer. Patient instructed to remain in clinic for 20 minutes afterwards, and to report any adverse reaction to me immediately.

## 2023-10-08 ENCOUNTER — Telehealth (HOSPITAL_BASED_OUTPATIENT_CLINIC_OR_DEPARTMENT_OTHER): Payer: Self-pay | Admitting: Pulmonary Disease

## 2023-10-08 NOTE — Telephone Encounter (Signed)
[facility] Telephone Results  Discussed HST results which demonstrate severe sleep apnea despite wearing oral compliance device.  Patient reports that Dr. Myrtis Ser was planning on contacting our office regarding timing of next sleep test when he has fully titrated device.  No records have been received so far and no documented telephone encounters. Provided patient [facility] office number and fax number.

## 2023-10-14 NOTE — Patient Instructions (Signed)
Cheryl [name]  10/14/2023     @PREFPERIOPPHARMACY @   Your procedure is scheduled on  10/19/2023.   Report to [facility] at  1130  A.M.   Call this number if you have problems the morning of surgery:  [phone]  If you experience any cold or flu symptoms such as cough, fever, chills, shortness of breath, etc. between now and your scheduled surgery, please notify us at the above number.   Remember:  Follow the diet and prep instructions given to you by the office.   You may drink clear liquids until 0930 am on 10/30/20240.    Clear liquids allowed are:                    Water, Carbonated beverages (diabetics please choose diet or no sugar options), Black Coffee Only (No creamer, milk or cream, including half & half and powdered creamer), and Clear Sports drink (No red color; diabetics please choose diet or no sugar options)    Take these medicines the morning of surgery with A SIP OF WATER             allopurinol, evista, amlodipine, buproprion, metoprolol, zofran (if needed), pantoprazole.      Do not wear jewelry, make-up or nail polish, including gel polish,  artificial nails, or any other type of covering on natural nails (fingers and  toes).  Do not wear lotions, powders, or perfumes, or deodorant.  Do not shave 48 hours prior to surgery.  Men may shave face and neck.  Do not bring valuables to the hospital.  [facility] is not responsible for any belongings or valuables.  Contacts, dentures or bridgework may not be worn into surgery.  Leave your suitcase in the car.  After surgery it may be brought to your room.  For patients admitted to the hospital, discharge time will be determined by your treatment team.  Patients discharged the day of surgery will not be allowed to drive home and must have someone with them for 24 hours.    Special instructions:   DO NOT smoke tobacco or vape for 24 hors before your procedure.  Please read over the following fact  sheets that you were given. Anesthesia Post-op Instructions and Care and Recovery After Surgery      Colonoscopy, Adult, Care After The following information offers guidance on how to care for yourself after your procedure. Your health care provider may also give you more specific instructions. If you have problems or questions, contact your health care provider. What can I expect after the procedure? After the procedure, it is common to have: A small amount of blood in your stool for 24 hours after the procedure. Some gas. Mild cramping or bloating of your abdomen. Follow these instructions at home: Eating and drinking  Drink enough fluid to keep your urine pale yellow. Follow instructions from your health care provider about eating or drinking restrictions. Resume your normal diet as told by your health care provider. Avoid heavy or fried foods that are hard to digest. Activity Rest as told by your health care provider. Avoid sitting for a long time without moving. Get up to take short walks every 1-2 hours. This is important to improve blood flow and breathing. Ask for help if you feel weak or unsteady. Return to your normal activities as told by your health care provider. Ask your health care provider what activities are safe for you. Managing cramping  and bloating  Try walking around when you have cramps or feel bloated. If directed, apply heat to your abdomen as told by your health care provider. Use the heat source that your health care provider recommends, such as a moist heat pack or a heating pad. Place a towel between your skin and the heat source. Leave the heat on for 20-30 minutes. Remove the heat if your skin turns bright red. This is especially important if you are unable to feel pain, heat, or cold. You have a greater risk of getting burned. General instructions If you were given a sedative during the procedure, it can affect you for several hours. Do not drive or  operate machinery until your health care provider says that it is safe. For the first 24 hours after the procedure: Do not sign important documents. Do not drink alcohol. Do your regular daily activities at a slower pace than normal. Eat soft foods that are easy to digest. Take over-the-counter and prescription medicines only as told by your health care provider. Keep all follow-up visits. This is important. Contact a health care provider if: You have blood in your stool 2-3 days after the procedure. Get help right away if: You have more than a small spotting of blood in your stool. You have large blood clots in your stool. You have swelling of your abdomen. You have nausea or vomiting. You have a fever. You have increasing pain in your abdomen that is not relieved with medicine. These symptoms may be an emergency. Get help right away. Call 911. Do not wait to see if the symptoms will go away. Do not drive yourself to the hospital. Summary After the procedure, it is common to have a small amount of blood in your stool. You may also have mild cramping and bloating of your abdomen. If you were given a sedative during the procedure, it can affect you for several hours. Do not drive or operate machinery until your health care provider says that it is safe. Get help right away if you have a lot of blood in your stool, nausea or vomiting, a fever, or increased pain in your abdomen. This information is not intended to replace advice given to you by your health care provider. Make sure you discuss any questions you have with your health care provider. Document Revised: 01/18/2023 Document Reviewed: 07/29/2021 Elsevier Patient Education  2024 [employer] Monitored Anesthesia Care, Care After The following information offers guidance on how to care for yourself after your procedure. Your health care provider may also give you more specific instructions. If you have problems or questions, contact  your health care provider. What can I expect after the procedure? After the procedure, it is common to have: Tiredness. Little or no memory about what happened during or after the procedure. Impaired judgment when it comes to making decisions. Nausea or vomiting. Some trouble with balance. Follow these instructions at home: For the time period you were told by your health care provider:  Rest. Do not participate in activities where you could fall or become injured. Do not drive or use machinery. Do not drink alcohol. Do not take sleeping pills or medicines that cause drowsiness. Do not make important decisions or sign legal documents. Do not take care of children on your own. Medicines Take over-the-counter and prescription medicines only as told by your health care provider. If you were prescribed antibiotics, take them as told by your health care provider. Do not stop using the  antibiotic even if you start to feel better. Eating and drinking Follow instructions from your health care provider about what you may eat and drink. Drink enough fluid to keep your urine pale yellow. If you vomit: Drink clear fluids slowly and in small amounts as you are able. Clear fluids include water, ice chips, low-calorie sports drinks, and fruit juice that has water added to it (diluted fruit juice). Eat light and bland foods in small amounts as you are able. These foods include bananas, applesauce, rice, lean meats, toast, and crackers. General instructions  Have a responsible adult stay with you for the time you are told. It is important to have someone help care for you until you are awake and alert. If you have sleep apnea, surgery and some medicines can increase your risk for breathing problems. Follow instructions from your health care provider about wearing your sleep device: When you are sleeping. This includes during daytime naps. While taking prescription pain medicines, sleeping medicines,  or medicines that make you drowsy. Do not use any products that contain nicotine or tobacco. These products include cigarettes, chewing tobacco, and vaping devices, such as e-cigarettes. If you need help quitting, ask your health care provider. Contact a health care provider if: You feel nauseous or vomit every time you eat or drink. You feel light-headed. You are still sleepy or having trouble with balance after 24 hours. You get a rash. You have a fever. You have redness or swelling around the IV site. Get help right away if: You have trouble breathing. You have new confusion after you get home. These symptoms may be an emergency. Get help right away. Call 911. Do not wait to see if the symptoms will go away. Do not drive yourself to the hospital. This information is not intended to replace advice given to you by your health care provider. Make sure you discuss any questions you have with your health care provider. Document Revised: 05/03/2022 Document Reviewed: 05/03/2022 Elsevier Patient Education  2024 [employer].

## 2023-10-17 ENCOUNTER — Encounter (HOSPITAL_COMMUNITY): Payer: Self-pay

## 2023-10-17 ENCOUNTER — Other Ambulatory Visit: Payer: Self-pay

## 2023-10-17 ENCOUNTER — Encounter (HOSPITAL_COMMUNITY)
Admission: RE | Admit: 2023-10-17 | Discharge: 2023-10-17 | Disposition: A | Discharge status: Home / Self Care | Payer: Medicare Other | Source: Ambulatory Visit | Attending: Internal Medicine

## 2023-10-17 VITALS — BP 124/77 | HR 77 | Temp 97.8°F | Resp 18 | Ht 59.0 in | Wt 216.7 lb

## 2023-10-17 DIAGNOSIS — I10 Essential (primary) hypertension: Secondary | ICD-10-CM

## 2023-10-17 DIAGNOSIS — N189 Chronic kidney disease, unspecified: Secondary | ICD-10-CM | POA: Diagnosis not present

## 2023-10-17 DIAGNOSIS — Z01818 Encounter for other preprocedural examination: Secondary | ICD-10-CM | POA: Diagnosis not present

## 2023-10-17 DIAGNOSIS — I129 Hypertensive chronic kidney disease with stage 1 through stage 4 chronic kidney disease, or unspecified chronic kidney disease: Secondary | ICD-10-CM | POA: Diagnosis not present

## 2023-10-17 DIAGNOSIS — D631 Anemia in chronic kidney disease: Secondary | ICD-10-CM | POA: Diagnosis not present

## 2023-10-17 DIAGNOSIS — Z01812 Encounter for preprocedural laboratory examination: Secondary | ICD-10-CM | POA: Diagnosis present

## 2023-10-17 DIAGNOSIS — D649 Anemia, unspecified: Secondary | ICD-10-CM

## 2023-10-17 DIAGNOSIS — Z01810 Encounter for preprocedural cardiovascular examination: Secondary | ICD-10-CM | POA: Diagnosis present

## 2023-10-17 HISTORY — DX: Essential (primary) hypertension: I10

## 2023-10-17 HISTORY — DX: Dyspnea, unspecified: R06.00

## 2023-10-17 LAB — CBC WITH DIFFERENTIAL/PLATELET
Abs Immature Granulocytes: 0.06 10*3/uL (ref 0.00–0.07)
Basophils Absolute: 0 10*3/uL (ref 0.0–0.1)
Basophils Relative: 0 %
Eosinophils Absolute: 0.3 10*3/uL (ref 0.0–0.5)
Eosinophils Relative: 3 %
HCT: 36.3 % (ref 36.0–46.0)
Hemoglobin: 11 g/dL — ABNORMAL LOW (ref 12.0–15.0)
Immature Granulocytes: 1 %
Lymphocytes Relative: 14 %
Lymphs Abs: 1.3 10*3/uL (ref 0.7–4.0)
MCH: 32 pg (ref 26.0–34.0)
MCHC: 30.3 g/dL (ref 30.0–36.0)
MCV: 105.5 fL — ABNORMAL HIGH (ref 80.0–100.0)
Monocytes Absolute: 1.1 10*3/uL — ABNORMAL HIGH (ref 0.1–1.0)
Monocytes Relative: 12 %
Neutro Abs: 6.3 10*3/uL (ref 1.7–7.7)
Neutrophils Relative %: 70 %
Platelets: 368 10*3/uL (ref 150–400)
RBC: 3.44 MIL/uL — ABNORMAL LOW (ref 3.87–5.11)
RDW: 15.3 % (ref 11.5–15.5)
WBC: 9.1 10*3/uL (ref 4.0–10.5)
nRBC: 0 % (ref 0.0–0.2)

## 2023-10-19 ENCOUNTER — Encounter (HOSPITAL_COMMUNITY): Payer: Self-pay | Admitting: Internal Medicine

## 2023-10-19 ENCOUNTER — Encounter (HOSPITAL_COMMUNITY)
Admission: RE | Disposition: A | Discharge status: Home / Self Care | Payer: Self-pay | Source: Home / Self Care | Attending: Internal Medicine

## 2023-10-19 ENCOUNTER — Ambulatory Visit (HOSPITAL_BASED_OUTPATIENT_CLINIC_OR_DEPARTMENT_OTHER): Payer: Medicare Other | Admitting: Anesthesiology

## 2023-10-19 ENCOUNTER — Ambulatory Visit (HOSPITAL_COMMUNITY)
Admission: RE | Admit: 2023-10-19 | Discharge: 2023-10-19 | Disposition: A | Discharge status: Home / Self Care | Payer: Medicare Other | Attending: Internal Medicine | Admitting: Internal Medicine

## 2023-10-19 ENCOUNTER — Ambulatory Visit (HOSPITAL_COMMUNITY): Payer: Medicare Other | Admitting: Anesthesiology

## 2023-10-19 DIAGNOSIS — K6389 Other specified diseases of intestine: Secondary | ICD-10-CM | POA: Diagnosis not present

## 2023-10-19 DIAGNOSIS — Z87891 Personal history of nicotine dependence: Secondary | ICD-10-CM | POA: Insufficient documentation

## 2023-10-19 DIAGNOSIS — K279 Peptic ulcer, site unspecified, unspecified as acute or chronic, without hemorrhage or perforation: Secondary | ICD-10-CM | POA: Insufficient documentation

## 2023-10-19 DIAGNOSIS — N189 Chronic kidney disease, unspecified: Secondary | ICD-10-CM | POA: Insufficient documentation

## 2023-10-19 DIAGNOSIS — Z860100 Personal history of colon polyps, unspecified: Secondary | ICD-10-CM

## 2023-10-19 DIAGNOSIS — Q438 Other specified congenital malformations of intestine: Secondary | ICD-10-CM | POA: Diagnosis not present

## 2023-10-19 DIAGNOSIS — G4730 Sleep apnea, unspecified: Secondary | ICD-10-CM | POA: Diagnosis not present

## 2023-10-19 DIAGNOSIS — D649 Anemia, unspecified: Secondary | ICD-10-CM | POA: Diagnosis not present

## 2023-10-19 DIAGNOSIS — Z833 Family history of diabetes mellitus: Secondary | ICD-10-CM | POA: Insufficient documentation

## 2023-10-19 DIAGNOSIS — D124 Benign neoplasm of descending colon: Secondary | ICD-10-CM | POA: Insufficient documentation

## 2023-10-19 DIAGNOSIS — I129 Hypertensive chronic kidney disease with stage 1 through stage 4 chronic kidney disease, or unspecified chronic kidney disease: Secondary | ICD-10-CM | POA: Insufficient documentation

## 2023-10-19 DIAGNOSIS — K635 Polyp of colon: Secondary | ICD-10-CM | POA: Diagnosis not present

## 2023-10-19 DIAGNOSIS — Z1211 Encounter for screening for malignant neoplasm of colon: Secondary | ICD-10-CM | POA: Diagnosis not present

## 2023-10-19 DIAGNOSIS — D126 Benign neoplasm of colon, unspecified: Secondary | ICD-10-CM

## 2023-10-19 DIAGNOSIS — Z860101 Personal history of adenomatous and serrated colon polyps: Secondary | ICD-10-CM | POA: Diagnosis not present

## 2023-10-19 DIAGNOSIS — Z79899 Other long term (current) drug therapy: Secondary | ICD-10-CM | POA: Insufficient documentation

## 2023-10-19 DIAGNOSIS — K5730 Diverticulosis of large intestine without perforation or abscess without bleeding: Secondary | ICD-10-CM | POA: Insufficient documentation

## 2023-10-19 DIAGNOSIS — R0602 Shortness of breath: Secondary | ICD-10-CM | POA: Diagnosis not present

## 2023-10-19 DIAGNOSIS — K219 Gastro-esophageal reflux disease without esophagitis: Secondary | ICD-10-CM | POA: Insufficient documentation

## 2023-10-19 DIAGNOSIS — D122 Benign neoplasm of ascending colon: Secondary | ICD-10-CM | POA: Insufficient documentation

## 2023-10-19 HISTORY — PX: COLONOSCOPY WITH PROPOFOL: SHX5780

## 2023-10-19 HISTORY — PX: POLYPECTOMY: SHX5525

## 2023-10-19 SURGERY — COLONOSCOPY WITH PROPOFOL
Anesthesia: General

## 2023-10-19 MED ORDER — PHENYLEPHRINE 80 MCG/ML (10ML) SYRINGE FOR IV PUSH (FOR BLOOD PRESSURE SUPPORT)
PREFILLED_SYRINGE | INTRAVENOUS | Status: DC | PRN
  Administered 2023-10-19 (×3): 160 ug via INTRAVENOUS

## 2023-10-19 MED ORDER — PROPOFOL 10 MG/ML IV BOLUS
INTRAVENOUS | Status: DC | PRN
  Administered 2023-10-19: 50 mg via INTRAVENOUS
  Administered 2023-10-19: 100 mg via INTRAVENOUS
  Administered 2023-10-19: 50 mg via INTRAVENOUS

## 2023-10-19 MED ORDER — LACTATED RINGERS IV SOLN: INTRAVENOUS | Status: DC | PRN

## 2023-10-19 MED ORDER — LIDOCAINE HCL (CARDIAC) PF 100 MG/5ML IV SOSY
PREFILLED_SYRINGE | INTRAVENOUS | Status: DC | PRN
  Administered 2023-10-19: 50 mg via INTRAVENOUS

## 2023-10-19 MED ORDER — PHENYLEPHRINE 80 MCG/ML (10ML) SYRINGE FOR IV PUSH (FOR BLOOD PRESSURE SUPPORT)
PREFILLED_SYRINGE | INTRAVENOUS | Status: AC
  Filled 2023-10-19: qty 10

## 2023-10-19 MED ORDER — STERILE WATER FOR IRRIGATION IR SOLN
Status: DC | PRN
  Administered 2023-10-19: 60 mL

## 2023-10-19 MED ORDER — PROPOFOL 500 MG/50ML IV EMUL
INTRAVENOUS | Status: DC | PRN
  Administered 2023-10-19: 150 ug/kg/min via INTRAVENOUS

## 2023-10-19 NOTE — Anesthesia Preprocedure Evaluation (Signed)
Anesthesia Evaluation  Patient identified by MRN, date of birth, ID band Patient awake    Reviewed: Allergy & Precautions, H&P , NPO status , Patient's Chart, lab work & pertinent test results, reviewed documented beta blocker date and time   Airway Mallampati: II  TM Distance: >3 FB Neck ROM: full    Dental no notable dental hx.    Pulmonary neg pulmonary ROS, shortness of breath, sleep apnea , former smoker   Pulmonary exam normal breath sounds clear to auscultation       Cardiovascular Exercise Tolerance: Good hypertension, negative cardio ROS  Rhythm:regular Rate:Normal     Neuro/Psych  Neuromuscular disease negative neurological ROS  negative psych ROS   GI/Hepatic negative GI ROS, Neg liver ROS, PUD,GERD  ,,  Endo/Other  negative endocrine ROS    Renal/GU Renal diseasenegative Renal ROS  negative genitourinary   Musculoskeletal   Abdominal   Peds  Hematology negative hematology ROS (+) Blood dyscrasia, anemia   Anesthesia Other Findings   Reproductive/Obstetrics negative OB ROS                             Anesthesia Physical Anesthesia Plan  ASA: 2  Anesthesia Plan: General   Post-op Pain Management:    Induction:   PONV Risk Score and Plan: Propofol infusion  Airway Management Planned:   Additional Equipment:   Intra-op Plan:   Post-operative Plan:   Informed Consent: I have reviewed the patients History and Physical, chart, labs and discussed the procedure including the risks, benefits and alternatives for the proposed anesthesia with the patient or authorized representative who has indicated his/her understanding and acceptance.     Dental Advisory Given  Plan Discussed with: CRNA  Anesthesia Plan Comments:        Anesthesia Quick Evaluation

## 2023-10-19 NOTE — Op Note (Signed)
[facility] Patient Name: Cheryl [name] Procedure Date: 10/19/2023 1:42 PM MRN: [id number] Date of Birth: 1946/04/25 Attending MD: Gennette Pac , MD, [id number] CSN: [id number] Age: 77 Admit Type: Outpatient Procedure:                Colonoscopy Indications:              High risk colon cancer surveillance: Personal                            history of colonic polyps Providers:                Gennette Pac, MD, Edrick Kins, RN,                            Zena Amos Referring MD:              Medicines:                Propofol per Anesthesia Complications:            No immediate complications. Estimated Blood Loss:     Estimated blood loss was minimal. Procedure:                Pre-Anesthesia Assessment:                           - Prior to the procedure, a History and Physical                            was performed, and patient medications and                            allergies were reviewed. The patient's tolerance of                            previous anesthesia was also reviewed. The risks                            and benefits of the procedure and the sedation                            options and risks were discussed with the patient.                            All questions were answered, and informed consent                            was obtained. Prior Anticoagulants: The patient has                            taken no anticoagulant or antiplatelet agents. ASA                            Grade Assessment: III - A patient with severe  systemic disease. After reviewing the risks and                            benefits, the patient was deemed in satisfactory                            condition to undergo the procedure.                           After obtaining informed consent, the colonoscope                            was passed under direct vision. Throughout the                            procedure, the patient's  blood pressure, pulse, and                            oxygen saturations were monitored continuously. The                            [id number] scope was introduced through the                            anus and advanced to the the cecum, identified by                            appendiceal orifice and ileocecal valve. The                            colonoscopy was performed without difficulty. The                            patient tolerated the procedure well. The quality                            of the bowel preparation was adequate. The                            ileocecal valve, appendiceal orifice, and rectum                            were photographed. The colonoscopy was performed                            without difficulty. The patient tolerated the                            procedure well. The quality of the bowel                            preparation was adequate. Scope In: 2:05:02 PM Scope Out: 2:31:07 PM Scope Withdrawal Time: 0 hours 12 minutes 29 seconds  Total Procedure Duration: 0 hours 26 minutes 5 seconds  Findings:      The perianal and digital rectal examinations were normal. Redundant       colon. Required external abdominal pressure to reach the cecum. Densely       pigmented colonic mucosa diffusely consistent with melanosis coli.      Five sessile polyps were found in the descending colon and ascending       colon. The polyps were 3 to 5 mm in size. These polyps were removed with       a cold snare. Resection and retrieval were complete. Estimated blood       loss was minimal.      Scattered small-mouthed diverticula were found in the entire colon. One       of the small descending colon polyps was not recovered.      The exam was otherwise without abnormality on direct and retroflexion       views. Impression:               - Five 3 to 5 mm polyps in the descending colon and                            in the ascending colon, removed with a cold  snare.                            Resected and retrieved.                           - Diverticulosis in the entire examined colon.                            Melanosis coli. Redundant colon.                           - The examination was otherwise normal on direct                            and retroflexion views. Moderate Sedation:      Moderate (conscious) sedation was personally administered by an       anesthesia professional. The following parameters were monitored: oxygen       saturation, heart rate, blood pressure, respiratory rate, EKG, adequacy       of pulmonary ventilation, and response to care. Recommendation:           - Patient has a contact number available for                            emergencies. The signs and symptoms of potential                            delayed complications were discussed with the                            patient. Return to normal activities tomorrow.                            Written discharge instructions were provided to the  patient.                           - Advance diet as tolerated.                           - Continue present medications.                           - Repeat colonoscopy date to be determined after                            pending pathology results are reviewed for                            surveillance.                           - Return to GI office (date not yet determined). Procedure Code(s):        --- Professional ---                           [phone], Colonoscopy, flexible; with removal of                            tumor(s), polyp(s), or other lesion(s) by snare                            technique Diagnosis Code(s):        --- Professional ---                           Z86.010, Personal history of colonic polyps                           D12.4, Benign neoplasm of descending colon                           D12.2, Benign neoplasm of ascending colon                           K57.30,  Diverticulosis of large intestine without                            perforation or abscess without bleeding CPT copyright 2022 American Medical Association. All rights reserved. The codes documented in this report are preliminary and upon coder review may  be revised to meet current compliance requirements. Gerrit Friends. Manami Tutor, MD Gennette Pac, MD 10/19/2023 2:43:12 PM This report has been signed electronically. Number of Addenda: 0

## 2023-10-19 NOTE — H&P (Signed)
@LOGO @   Primary Care Physician:  Avis Epley, PA-C Primary Gastroenterologist:  Dr.   Pre-Procedure History & Physical: HPI:  Cheryl [name] is a 77 y.o. female here for   Past Medical History:  Diagnosis Date   Acid reflux    Arthritis    CKD (chronic kidney disease)    Constipation    Dyspnea    GERD (gastroesophageal reflux disease)    Gout    Hypertension    Macular retinoschisis of right eye 02/23/2021   Neuromuscular disorder (HCC)    PUD (peptic ulcer disease) 2017   gastric ulcer healed on repeat EGD in August 2017   Sinus drainage    Sleep apnea    had surgery to correct   Vitreomacular traction syndrome, with secondary hole formation 01/12/2021   Vitrectomy membrane peel gas injection right eye 05-27-2021    Past Surgical History:  Procedure Laterality Date   ANTERIOR CERVICAL DECOMP/DISCECTOMY FUSION N/A 11/19/2016   Procedure: ANTERIOR CERVICAL DISCECTOMY AND FUSION C5-6 WITH PLATES, SCREWS, CAGE, VIVIGEN II;  Surgeon: Kerrin Champagne, MD;  Location: [facility] OR;  Service: Orthopedics;  Laterality: N/A;   BACK SURGERY     CARPAL TUNNEL RELEASE     CATARACT EXTRACTION, BILATERAL Bilateral 2017   One in December 2017 and one in January 2018   COLONOSCOPY  02/15/2005   ZOX:[id number] small polyps ablated via cold biopsy, one from transverse colon and two from the rectum/Small external hemorrhoids   COLONOSCOPY  07/10/2010   [id number] rectum/long redundant colon, polyps in the sigmoid, descending, hepatic flexure/ADENOMATOUS POLYPS. next TCS due 06/2015   COLONOSCOPY  1995   3 polyps, path revealed chronic colitis   COLONOSCOPY N/A 08/05/2015   Procedure: COLONOSCOPY;  Surgeon: Corbin Ade, MD; melanosis coli, colonic diverticulosis. Repeat colonoscopy in 5 years for surveillance.   COLONOSCOPY N/A 09/03/2020   Procedure: COLONOSCOPY;  Surgeon: Corbin Ade, MD;  Location: [facility];  Service: Endoscopy;  Laterality: N/A;  11:15am    ESOPHAGOGASTRODUODENOSCOPY  1995   Gastritis   ESOPHAGOGASTRODUODENOSCOPY N/A 04/16/2013   RMR: [facility]   ESOPHAGOGASTRODUODENOSCOPY N/A 05/13/2016   Procedure: ESOPHAGOGASTRODUODENOSCOPY (EGD);  Surgeon: Corbin Ade, MD; gastric ulcer and erosions s/p biopsy, erosive gastropathy. Path of reactive and regenerative changes, no H. pylori.   ESOPHAGOGASTRODUODENOSCOPY N/A 08/18/2016   Procedure: ESOPHAGOGASTRODUODENOSCOPY (EGD);  Surgeon: Corbin Ade, MD; normal esophagus, previous gastric ulcer completely healed, small hiatal hernia, normal duodenum.   KNEE SURGERY     x2   POLYPECTOMY  09/03/2020   Procedure: POLYPECTOMY;  Surgeon: Corbin Ade, MD;  Location: [facility];  Service: Endoscopy;;   ROTATOR CUFF REPAIR     x2   SHOULDER ARTHROSCOPY WITH ROTATOR CUFF REPAIR AND SUBACROMIAL DECOMPRESSION Right 01/21/2015   Procedure: RIGHT SHOULDER ARTHROSCOPIC DEBRIDEMNT OF G-H JOINT AND REMOVAL OF LOOSE BODIES,ARTHROSCOPIC SUBACROMIAL DECOMPRESSION,MINI OPEN RCT REPAIR WITH SUPPLEMENTAL [facility] PATCH;  Surgeon: Valeria Batman, MD;  Location: [facility] OR;  Service: Orthopedics;  Laterality: Right;   TONSILLECTOMY     TRIGGER FINGER RELEASE     UVULOPALATOPHARYNGOPLASTY      Prior to Admission medications   Medication Sig Start Date End Date Taking? Authorizing Provider  acetaminophen (TYLENOL) 500 MG tablet Take 500-1,000 mg by mouth as needed (for pain/headache).    Yes [provider]  albuterol (VENTOLIN HFA) 108 (90 Base) MCG/ACT inhaler Inhale 2 puffs into the lungs every 6 (six) hours as needed for wheezing or shortness of  breath. 01/06/22  Yes Luciano Cutter, MD  allopurinol (ZYLOPRIM) 100 MG tablet Take 100 mg by mouth daily. 09/02/23  Yes [provider]  amLODipine (NORVASC) 2.5 MG tablet Take 2.5 mg by mouth daily. 04/27/21  Yes [provider]  buPROPion (WELLBUTRIN XL) 300 MG 24 hr tablet Take 300 mg by mouth daily. 05/15/19  Yes [provider]  calcitRIOL (ROCALTROL) 0.25 MCG capsule Take 0.25 mcg by mouth daily. 09/04/23  Yes [provider]  colchicine 0.6 MG tablet Take 0.6 mg by mouth daily as needed.   Yes [provider]  fluticasone (FLONASE) 50 MCG/ACT nasal spray Place 2 sprays into both nostrils daily as needed for allergies or rhinitis.   Yes [provider]  furosemide (LASIX) 80 MG tablet as needed. 05/26/20  Yes [provider]  LINZESS 290 MCG CAPS capsule TAKE 1 CAPSULE BY MOUTH DAILY BEFORE BREAKFAST. 10/29/20  Yes Ermalinda Memos S, PA-C  metoprolol succinate (TOPROL-XL) 50 MG 24 hr tablet Take 50 mg by mouth daily. Take with or immediately following a meal.   Yes [provider]  montelukast (SINGULAIR) 10 MG tablet Take 1 tablet (10 mg total) by mouth every morning. 03/29/20  Yes Emokpae, Courage, MD  pantoprazole (PROTONIX) 40 MG tablet TAKE 1 TABLET BY MOUTH 1 OR 2 TIMES DAILY BEFORE A MEAL FOR REFLUX 12/15/21  Yes Gelene Mink, NP  polyethylene glycol (MIRALAX / GLYCOLAX) packet Take 17 g by mouth daily as needed for mild constipation or moderate constipation.   Yes [provider]  raloxifene (EVISTA) 60 MG tablet Take 60 mg by mouth daily.   Yes [provider]  VELTASSA 8.4 g packet SMARTSIG:1 Packet(s) By Mouth 3 Times a Week 08/24/23  Yes [provider]  clobetasol ointment (TEMOVATE) 0.05 %  12/22/20   [provider]  clotrimazole-betamethasone (LOTRISONE) cream Apply topically.    [provider]  Cyanocobalamin (VITAMIN B-12) 5000 MCG TBDP Take 1,000 mcg by mouth daily.     [provider]  Docusate Sodium (DSS) 100 MG CAPS Take by mouth.    [provider]  guaiFENesin (MUCINEX) 600 MG 12 hr tablet Take 1 tablet (600 mg total) by mouth 2 (two) times daily. 03/29/20   Shon Hale, MD  losartan (COZAAR) 25 MG tablet Take 12.5 mg by mouth at bedtime.    [provider]  nystatin (MYCOSTATIN)  100000 UNIT/ML suspension Take 5 mLs by mouth 2 (two) times daily as needed (for thrush).  02/22/20   [provider]  ondansetron (ZOFRAN) 4 MG tablet Take 4 mg by mouth as needed for nausea or vomiting. 04/25/20   [provider]    Allergies as of 09/07/2023 - Review Complete 09/07/2023  Allergen Reaction Noted   Prednisone Other (See Comments) 04/04/2013   Tramadol Nausea Only 01/21/2015   Aspirin Nausea And Vomiting    Celecoxib Rash     Family History  Problem Relation Age of Onset   Cancer Mother    Cancer Father    Diabetes Sister    Colon cancer Neg Hx    Liver disease Neg Hx     Social History   Socioeconomic History   Marital status: Married    Spouse name: Not on file   Number of children: 2   Years of education: Not on file   Highest education level: Not on file  Occupational History   Occupation: BUS DRIVER    Employer: [employer]  Tobacco Use   Smoking status: Former    Current packs/day: 0.00    Average packs/day: 0.1 packs/day for 48.0 years (4.8 ttl pk-yrs)    Types: Cigarettes    Start date: 05/11/1957    Quit date: 05/11/2005    Years since quitting: 18.4    Passive exposure: Never   Smokeless tobacco: Never   Tobacco comments:    smoked less than 1 pack a week  Vaping Use   Vaping status: Never Used  Substance and Sexual Activity   Alcohol use: No    Alcohol/week: 0.0 standard drinks of alcohol   Drug use: No   Sexual activity: Not on file  Other Topics Concern   Not on file  Social History Narrative   Not on file   Social Determinants of Health   Financial Resource Strain: Not on file  Food Insecurity: Not on file  Transportation Needs: Not on file  Physical Activity: Not on file  Stress: Not on file  Social Connections: Not on file  Intimate Partner Violence: Not on file    Review of Systems: See HPI, otherwise negative ROS  Physical Exam: BP (!) 140/78   Pulse 78   Temp 97.9 F (36.6 C) (Oral)    Resp 15   Ht 4\' 11"  (1.499 m)   Wt 98.3 kg   SpO2 100%   BMI 43.77 kg/m  General:   Alert,  Well-developed, well-nourished, pleasant and cooperative in NAD Neck:  Supple; no masses or thyromegaly. No significant cervical adenopathy. Lungs:  Clear throughout to auscultation.   No wheezes, crackles, or rhonchi. No acute distress. Heart:  Regular rate and rhythm; no murmurs, clicks, rubs,  or gallops. Abdomen: Non-distended, normal bowel sounds.  Soft and nontender without appreciable mass or hepatosplenomegaly.   Impression/Plan:   77 year old lady with history of multiple colonic adenomas removed 2021 (7).  She is here for surveillance colonoscopy per plan.  The risks, benefits, limitations, alternatives and imponderables have been reviewed with the patient. Questions have been answered. All parties are agreeable.       Notice: This dictation was prepared with Dragon dictation along with smaller phrase technology. Any transcriptional errors that result from this process are unintentional and may not be corrected upon review.

## 2023-10-19 NOTE — Transfer of Care (Signed)
Immediate Anesthesia Transfer of Care Note  Patient: Cheryl [name]  Procedure(s) Performed: COLONOSCOPY WITH PROPOFOL POLYPECTOMY  Patient Location: Short Stay  Anesthesia Type:General  Level of Consciousness: awake, alert , oriented, and patient cooperative  Airway & Oxygen Therapy: Patient Spontanous Breathing  Post-op Assessment: Report given to RN, Post -op Vital signs reviewed and stable, and Patient moving all extremities X 4  Post vital signs: Reviewed and stable  Last Vitals:  Vitals Value Taken Time  BP 98/52 10/19/23 1439  Temp    Pulse 83 10/19/23 1439  Resp 16 10/19/23 1439  SpO2 98 % 10/19/23 1439    Last Pain:  Vitals:   10/19/23 1439  TempSrc:   PainSc: 0-No pain         Complications: No notable events documented.

## 2023-10-19 NOTE — Discharge Instructions (Addendum)
  Colonoscopy Discharge Instructions  Read the instructions outlined below and refer to this sheet in the next few weeks. These discharge instructions provide you with general information on caring for yourself after you leave the hospital. Your doctor may also give you specific instructions. While your treatment has been planned according to the most current medical practices available, unavoidable complications occasionally occur. If you have any problems or questions after discharge, call Dr. Jena Gauss at [phone]. ACTIVITY You may resume your regular activity, but move at a slower pace for the next 24 hours.  Take frequent rest periods for the next 24 hours.  Walking will help get rid of the air and reduce the bloated feeling in your belly (abdomen).  No driving for 24 hours (because of the medicine (anesthesia) used during the test).   Do not sign any important legal documents or operate any machinery for 24 hours (because of the anesthesia used during the test).  NUTRITION Drink plenty of fluids.  You may resume your normal diet as instructed by your doctor.  Begin with a light meal and progress to your normal diet. Heavy or fried foods are harder to digest and may make you feel sick to your stomach (nauseated).  Avoid alcoholic beverages for 24 hours or as instructed.  MEDICATIONS You may resume your normal medications unless your doctor tells you otherwise.  WHAT YOU CAN EXPECT TODAY Some feelings of bloating in the abdomen.  Passage of more gas than usual.  Spotting of blood in your stool or on the toilet paper.  IF YOU HAD POLYPS REMOVED DURING THE COLONOSCOPY: No aspirin products for 7 days or as instructed.  No alcohol for 7 days or as instructed.  Eat a soft diet for the next 24 hours.  FINDING OUT THE RESULTS OF YOUR TEST Not all test results are available during your visit. If your test results are not back during the visit, make an appointment with your caregiver to find out the  results. Do not assume everything is normal if you have not heard from your caregiver or the medical facility. It is important for you to follow up on all of your test results.  SEEK IMMEDIATE MEDICAL ATTENTION IF: You have more than a spotting of blood in your stool.  Your belly is swollen (abdominal distention).  You are nauseated or vomiting.  You have a temperature over 101.  You have abdominal pain or discomfort that is severe or gets worse throughout the day.       5 small polyps removed from your colon.  Colon polyp and diverticulosis information provided   further recommendations to follow pending review of pathology report   at patient request, called [place] [place] at [phone] -  call rolled to voicemail.  Left a message.

## 2023-10-19 NOTE — Anesthesia Procedure Notes (Signed)
Date/Time: 10/19/2023 1:59 PM  Performed by: Julian Reil, CRNAPre-anesthesia Checklist: Patient identified, Emergency Drugs available, Patient being monitored and Suction available Patient Re-evaluated:Patient Re-evaluated prior to induction Oxygen Delivery Method: Nasal cannula Induction Type: IV induction Placement Confirmation: positive ETCO2 Comments: Optiflow High Flow [place] O2 used.

## 2023-10-21 ENCOUNTER — Encounter: Payer: Self-pay | Admitting: Internal Medicine

## 2023-10-21 DIAGNOSIS — Z0001 Encounter for general adult medical examination with abnormal findings: Secondary | ICD-10-CM | POA: Diagnosis not present

## 2023-10-21 DIAGNOSIS — R7309 Other abnormal glucose: Secondary | ICD-10-CM | POA: Diagnosis not present

## 2023-10-21 DIAGNOSIS — E7849 Other hyperlipidemia: Secondary | ICD-10-CM | POA: Diagnosis not present

## 2023-10-21 DIAGNOSIS — N185 Chronic kidney disease, stage 5: Secondary | ICD-10-CM | POA: Diagnosis not present

## 2023-10-21 LAB — SURGICAL PATHOLOGY

## 2023-10-24 DIAGNOSIS — N057 Unspecified nephritic syndrome with diffuse crescentic glomerulonephritis: Secondary | ICD-10-CM | POA: Diagnosis not present

## 2023-10-24 DIAGNOSIS — N058 Unspecified nephritic syndrome with other morphologic changes: Secondary | ICD-10-CM | POA: Diagnosis not present

## 2023-10-24 DIAGNOSIS — N189 Chronic kidney disease, unspecified: Secondary | ICD-10-CM | POA: Diagnosis not present

## 2023-10-24 DIAGNOSIS — R768 Other specified abnormal immunological findings in serum: Secondary | ICD-10-CM | POA: Diagnosis not present

## 2023-10-24 DIAGNOSIS — M109 Gout, unspecified: Secondary | ICD-10-CM | POA: Diagnosis not present

## 2023-10-24 DIAGNOSIS — I120 Hypertensive chronic kidney disease with stage 5 chronic kidney disease or end stage renal disease: Secondary | ICD-10-CM | POA: Diagnosis not present

## 2023-10-24 DIAGNOSIS — R942 Abnormal results of pulmonary function studies: Secondary | ICD-10-CM | POA: Diagnosis not present

## 2023-10-24 DIAGNOSIS — N185 Chronic kidney disease, stage 5: Secondary | ICD-10-CM | POA: Diagnosis not present

## 2023-10-24 DIAGNOSIS — E872 Acidosis, unspecified: Secondary | ICD-10-CM | POA: Diagnosis not present

## 2023-10-24 DIAGNOSIS — N2581 Secondary hyperparathyroidism of renal origin: Secondary | ICD-10-CM | POA: Diagnosis not present

## 2023-10-24 DIAGNOSIS — E785 Hyperlipidemia, unspecified: Secondary | ICD-10-CM | POA: Diagnosis not present

## 2023-10-24 DIAGNOSIS — E875 Hyperkalemia: Secondary | ICD-10-CM | POA: Diagnosis not present

## 2023-10-24 DIAGNOSIS — D631 Anemia in chronic kidney disease: Secondary | ICD-10-CM | POA: Diagnosis not present

## 2023-10-24 NOTE — Anesthesia Postprocedure Evaluation (Signed)
Anesthesia Post Note  Patient: Cheryl [name]  Procedure(s) Performed: COLONOSCOPY WITH PROPOFOL POLYPECTOMY  Patient location during evaluation: Phase II Anesthesia Type: General Level of consciousness: awake Pain management: pain level controlled Vital Signs Assessment: post-procedure vital signs reviewed and stable Respiratory status: spontaneous breathing and respiratory function stable Cardiovascular status: blood pressure returned to baseline and stable Postop Assessment: no headache and no apparent nausea or vomiting Anesthetic complications: no Comments: Late entry   No notable events documented.   Last Vitals:  Vitals:   10/19/23 1439 10/19/23 1443  BP: (!) 98/52 113/64  Pulse: 83 72  Resp: 16 14  Temp:  36.6 C  SpO2: 98% 98%    Last Pain:  Vitals:   10/19/23 1443  TempSrc: Axillary  PainSc: 0-No pain                 Windell Norfolk

## 2023-10-25 ENCOUNTER — Encounter (HOSPITAL_COMMUNITY): Payer: Self-pay | Admitting: Internal Medicine

## 2023-11-14 DIAGNOSIS — N185 Chronic kidney disease, stage 5: Secondary | ICD-10-CM | POA: Diagnosis not present

## 2023-11-14 DIAGNOSIS — N189 Chronic kidney disease, unspecified: Secondary | ICD-10-CM | POA: Diagnosis not present

## 2023-11-16 DIAGNOSIS — Z1231 Encounter for screening mammogram for malignant neoplasm of breast: Secondary | ICD-10-CM | POA: Diagnosis not present

## 2023-11-16 DIAGNOSIS — Z803 Family history of malignant neoplasm of breast: Secondary | ICD-10-CM | POA: Diagnosis not present

## 2023-11-16 DIAGNOSIS — L900 Lichen sclerosus et atrophicus: Secondary | ICD-10-CM | POA: Diagnosis not present

## 2023-11-16 DIAGNOSIS — M810 Age-related osteoporosis without current pathological fracture: Secondary | ICD-10-CM | POA: Diagnosis not present

## 2023-11-21 ENCOUNTER — Ambulatory Visit: Payer: Medicare Other | Attending: Family Medicine | Admitting: Audiologist

## 2023-11-21 DIAGNOSIS — H903 Sensorineural hearing loss, bilateral: Secondary | ICD-10-CM | POA: Insufficient documentation

## 2023-11-21 NOTE — Procedures (Signed)
  Outpatient Audiology and [facility] [address] [place], [place]  [address] [phone]  AUDIOLOGICAL  EVALUATION  NAME: Cheryl [name]     DOB:   July 03, 1946      MRN: [id number]                                                                                     DATE: 11/21/2023     REFERENT: Avis Epley, PA-C STATUS: Outpatient DIAGNOSIS: Sensorineural Hearing Loss    History: Cheryl [name] was seen for an audiological evaluation due to difficulty hearing her sister. Cheryl [name] feels she hears well, but her sister is difficult to hear. This muffled hearing has slowly progressed. She has to ask her sister to repeat herself often. Cheryl [name] denies pain, pressure, or tinnitus. 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 Medical history shows kidney disease which can be a risk factor for hearing loss.    Evaluation:  Otoscopy showed a clear view of the tympanic membranes, bilaterally Tympanometry results were consistent with normal middle ear function, bilaterally   Audiometric testing was completed using Conventional Audiometry techniques with insert earphones and supraural headphones. Test results are consistent with mild sloping to moderate sensorineural hearing loss bilaterally. Speech Recognition Thresholds were obtained at 45dB HL in the right ear and at 45 dB HL in the left ear. Word Recognition Testing was completed at  40dB SL and Cheryl [name] scored 100% in each ear.    Results:  The test results were reviewed with Cheryl [name]. She has a moderate sensorineural  hearing loss in both ears. This is why speech sounds muffled. She needs to use hearing aids daily. She does not feel the loss impacts her enough to necessitate daily use of hearing aids. She is not ready to pursue aids.  Audiogram printed and provided to Cheryl [name].   Recommendations: Hearing aids recommended for both ears however Cheryl [name] not ready to pursue aids at this time. Annual  audiometric testing recommended to monitor hearing loss for progression.    36 minutes spent testing and counseling on results.   If you have any questions please feel free to contact me at [phone].  Ammie Ferrier Au.D.  Audiologist   11/21/2023  1:23 PM  Cc: Avis Epley, PA-C

## 2023-11-24 DIAGNOSIS — Z9889 Other specified postprocedural states: Secondary | ICD-10-CM | POA: Diagnosis not present

## 2023-11-24 DIAGNOSIS — Z961 Presence of intraocular lens: Secondary | ICD-10-CM | POA: Diagnosis not present

## 2023-11-24 DIAGNOSIS — H40013 Open angle with borderline findings, low risk, bilateral: Secondary | ICD-10-CM | POA: Diagnosis not present

## 2023-11-30 DIAGNOSIS — K219 Gastro-esophageal reflux disease without esophagitis: Secondary | ICD-10-CM | POA: Diagnosis not present

## 2023-11-30 DIAGNOSIS — R942 Abnormal results of pulmonary function studies: Secondary | ICD-10-CM | POA: Diagnosis not present

## 2023-11-30 DIAGNOSIS — D631 Anemia in chronic kidney disease: Secondary | ICD-10-CM | POA: Diagnosis not present

## 2023-11-30 DIAGNOSIS — N2581 Secondary hyperparathyroidism of renal origin: Secondary | ICD-10-CM | POA: Diagnosis not present

## 2023-11-30 DIAGNOSIS — N057 Unspecified nephritic syndrome with diffuse crescentic glomerulonephritis: Secondary | ICD-10-CM | POA: Diagnosis not present

## 2023-11-30 DIAGNOSIS — E875 Hyperkalemia: Secondary | ICD-10-CM | POA: Diagnosis not present

## 2023-11-30 DIAGNOSIS — I120 Hypertensive chronic kidney disease with stage 5 chronic kidney disease or end stage renal disease: Secondary | ICD-10-CM | POA: Diagnosis not present

## 2023-11-30 DIAGNOSIS — E785 Hyperlipidemia, unspecified: Secondary | ICD-10-CM | POA: Diagnosis not present

## 2023-11-30 DIAGNOSIS — N058 Unspecified nephritic syndrome with other morphologic changes: Secondary | ICD-10-CM | POA: Diagnosis not present

## 2023-11-30 DIAGNOSIS — N185 Chronic kidney disease, stage 5: Secondary | ICD-10-CM | POA: Diagnosis not present

## 2023-11-30 DIAGNOSIS — M109 Gout, unspecified: Secondary | ICD-10-CM | POA: Diagnosis not present

## 2023-11-30 DIAGNOSIS — E872 Acidosis, unspecified: Secondary | ICD-10-CM | POA: Diagnosis not present

## 2023-12-06 ENCOUNTER — Encounter (HOSPITAL_BASED_OUTPATIENT_CLINIC_OR_DEPARTMENT_OTHER): Payer: Self-pay | Admitting: Emergency Medicine

## 2023-12-06 ENCOUNTER — Other Ambulatory Visit: Payer: Self-pay

## 2023-12-06 ENCOUNTER — Emergency Department (HOSPITAL_BASED_OUTPATIENT_CLINIC_OR_DEPARTMENT_OTHER)
Admission: EM | Admit: 2023-12-06 | Discharge: 2023-12-06 | Disposition: A | Discharge status: Home / Self Care | Payer: Medicare Other | Attending: Emergency Medicine | Admitting: Emergency Medicine

## 2023-12-06 DIAGNOSIS — Z20822 Contact with and (suspected) exposure to covid-19: Secondary | ICD-10-CM | POA: Insufficient documentation

## 2023-12-06 DIAGNOSIS — J101 Influenza due to other identified influenza virus with other respiratory manifestations: Secondary | ICD-10-CM | POA: Diagnosis not present

## 2023-12-06 DIAGNOSIS — R059 Cough, unspecified: Secondary | ICD-10-CM | POA: Diagnosis present

## 2023-12-06 LAB — RESP PANEL BY RT-PCR (RSV, FLU A&B, COVID)  RVPGX2
Influenza A by PCR: POSITIVE — AB
Influenza B by PCR: NEGATIVE
Resp Syncytial Virus by PCR: NEGATIVE
SARS Coronavirus 2 by RT PCR: NEGATIVE

## 2023-12-06 MED ORDER — BENZONATATE 100 MG PO CAPS: 100.0000 mg | ORAL_CAPSULE | Freq: Three times a day (TID) | ORAL | 0 refills | Status: AC

## 2023-12-06 NOTE — ED Notes (Signed)
Pt provided with ginger ale for fluid challenge. Pt able to tolerate fluids at this time. Pt denies N &V.

## 2023-12-06 NOTE — ED Provider Notes (Signed)
[place] EMERGENCY DEPARTMENT AT [facility] Provider Note   CSN: [id number] Arrival date & time: 12/06/23  1452     History  Chief Complaint  Patient presents with   URI    Cheryl [name] is a 77 y.o. female history of interstitial lung disease, GERD, Hashimoto's presented for 5 days of nonproductive cough.  Patient states that she feels congested however once her primary care provider has been taking hydrocodone mixed cough medicine that has been helping.  Patient denies any fevers, chills, nauseous vomiting, chest pain or shortness of breath, wheezing, headaches, neck pain, weakness.  Patient states she is able to eat but has decreased appetite.  Patient is unsure of sick contacts.  Home Medications Prior to Admission medications   Medication Sig Start Date End Date Taking? Authorizing Provider  benzonatate (TESSALON) 100 MG capsule Take 1 capsule (100 mg total) by mouth every 8 (eight) hours. 12/06/23  Yes Netta Corrigan, PA-C  acetaminophen (TYLENOL) 500 MG tablet Take 500-1,000 mg by mouth as needed (for pain/headache).     [provider]  albuterol (VENTOLIN HFA) 108 (90 Base) MCG/ACT inhaler Inhale 2 puffs into the lungs every 6 (six) hours as needed for wheezing or shortness of breath. 01/06/22   Luciano Cutter, MD  allopurinol (ZYLOPRIM) 100 MG tablet Take 100 mg by mouth daily. 09/02/23   [provider]  amLODipine (NORVASC) 2.5 MG tablet Take 2.5 mg by mouth daily. 04/27/21   [provider]  buPROPion (WELLBUTRIN XL) 300 MG 24 hr tablet Take 300 mg by mouth daily. 05/15/19   [provider]  calcitRIOL (ROCALTROL) 0.25 MCG capsule Take 0.25 mcg by mouth daily. 09/04/23   [provider]  clobetasol ointment (TEMOVATE) 0.05 %  12/22/20   [provider]  clotrimazole-betamethasone (LOTRISONE) cream Apply topically.    [provider]  colchicine 0.6 MG tablet Take 0.6 mg by mouth daily as needed.     [provider]  Cyanocobalamin (VITAMIN B-12) 5000 MCG TBDP Take 1,000 mcg by mouth daily.     [provider]  Docusate Sodium (DSS) 100 MG CAPS Take by mouth.    [provider]  fluticasone (FLONASE) 50 MCG/ACT nasal spray Place 2 sprays into both nostrils daily as needed for allergies or rhinitis.    [provider]  furosemide (LASIX) 80 MG tablet as needed. 05/26/20   [provider]  guaiFENesin (MUCINEX) 600 MG 12 hr tablet Take 1 tablet (600 mg total) by mouth 2 (two) times daily. 03/29/20   Shon Hale, MD  LINZESS 290 MCG CAPS capsule TAKE 1 CAPSULE BY MOUTH DAILY BEFORE BREAKFAST. 10/29/20   Letta Median, PA-C  losartan (COZAAR) 25 MG tablet Take 12.5 mg by mouth at bedtime.    [provider]  metoprolol succinate (TOPROL-XL) 50 MG 24 hr tablet Take 50 mg by mouth daily. Take with or immediately following a meal.    [provider]  montelukast (SINGULAIR) 10 MG tablet Take 1 tablet (10 mg total) by mouth every morning. 03/29/20   Shon Hale, MD  nystatin (MYCOSTATIN) 100000 UNIT/ML suspension Take 5 mLs by mouth 2 (two) times daily as needed (for thrush).  02/22/20   [provider]  ondansetron (ZOFRAN) 4 MG tablet Take 4 mg by mouth as needed for nausea or vomiting. 04/25/20   [provider]  pantoprazole (PROTONIX) 40 MG tablet TAKE 1 TABLET BY MOUTH 1 OR 2 TIMES DAILY BEFORE A  MEAL FOR REFLUX 12/15/21   Gelene Mink, NP  polyethylene glycol [facility] / Ethelene Hal) packet Take 17 g by mouth daily as needed for mild constipation or moderate constipation.    [provider]  raloxifene (EVISTA) 60 MG tablet Take 60 mg by mouth daily.    [provider]  VELTASSA 8.4 g packet SMARTSIG:1 Packet(s) By Mouth 3 Times a Week 08/24/23   [provider]      Allergies    Prednisone, Tramadol, Aspirin, and Celecoxib    Review of Systems   Review of Systems  Physical  Exam Updated Vital Signs BP 136/89 (BP Location: Right Arm)   Pulse 86   Temp 98.1 F (36.7 C)   Resp 17   SpO2 91%  Physical Exam HENT:     Right Ear: Tympanic membrane, ear canal and external ear normal.     Left Ear: Tympanic membrane, ear canal and external ear normal.     Nose: Congestion and rhinorrhea present.     Mouth/Throat:     Mouth: Mucous membranes are moist.     Pharynx: Posterior oropharyngeal erythema present.  Eyes:     Extraocular Movements: Extraocular movements intact.     Conjunctiva/sclera: Conjunctivae normal.     Pupils: Pupils are equal, round, and reactive to light.  Cardiovascular:     Rate and Rhythm: Normal rate and regular rhythm.     Pulses: Normal pulses.     Heart sounds: Normal heart sounds.     Comments: Nonproductive cough in the room Pulmonary:     Effort: Pulmonary effort is normal. No respiratory distress.     Breath sounds: Normal breath sounds.  Abdominal:     General: Abdomen is flat.     Palpations: Abdomen is soft.     Tenderness: There is no abdominal tenderness. There is no guarding.  Musculoskeletal:        General: Normal range of motion.     Cervical back: Normal range of motion.  Skin:    General: Skin is warm.     Capillary Refill: Capillary refill takes less than 2 seconds.  Neurological:     General: No focal deficit present.     Mental Status: She is alert and oriented to person, place, and time.  Psychiatric:        Mood and Affect: Mood normal.     ED Results / Procedures / Treatments   Labs (all labs ordered are listed, but only abnormal results are displayed) Labs Reviewed  RESP PANEL BY RT-PCR (RSV, FLU A&B, COVID)  RVPGX2 - Abnormal; Notable for the following components:      Result Value   Influenza A by PCR POSITIVE (*)    All other components within normal limits    EKG None  Radiology No results found.  Procedures Procedures    Medications Ordered in ED Medications - No data to  display  ED Course/ Medical Decision Making/ A&P                                 Medical Decision Making  Jeralynn Wyszynski Johanning 77 y.o. presented today for URI like symptoms. Working DDx that I considered at this time includes, but not limited to, viral illness, pharyngitis, mono, sinusitis, electrolyte abnormality, AOM.  R/o DDx: pharyngitis, mono, sinusitis, electrolyte abnormality, AOM: these diagnoses are not consistent with patient's history, presentation, physical exam, labs/imaging findings.  Review of prior external notes: 10/19/2023 discharge  Unique Tests and My Interpretation:  Respiratory Panel: Influenza A positive  Social Determinants of Health: none  Discussion with Independent Historian: None  Discussion of Management of Tests: None  Risk: Medium: prescription drug management  Risk Stratification Score: None  Plan: On exam patient was in no acute distress stable vitals.  Patient nonproductive cough in the room but otherwise had reassuring physical exam.  From triage patient did test positive for influenza A however patient is outside the 48 hours for Tamiflu at this time.  I spoke to the patient and we had shared decision making and agreed to forego a chest x-ray at this time as patient would like to try outpatient therapy which is reasonable.  Will p.o. challenge and if successful discharge with primary care follow-up along with Tessalon and encouraged her to use Tylenol every 6 hours as needed for pain remain hydrated food as tolerated.  Patient was successfully p.o. challenge and is asking to be discharged at this time.  Will discharge on Tessalon and encouraged to use honey and Tylenol every 6 hours and remain hydrated and eat food as tolerated follow-up primary care provider.  Patient was given return precautions.patient stable for discharge at this time.  Patient verbalized understanding of plan.  This chart was dictated using voice recognition software.  Despite  best efforts to proofread,  errors can occur which can change the documentation meaning.         Final Clinical Impression(s) / ED Diagnoses Final diagnoses:  Influenza A    Rx / DC Orders ED Discharge Orders          Ordered    benzonatate (TESSALON) 100 MG capsule  Every 8 hours        12/06/23 1749              Remi Deter 12/06/23 1759    Benjiman Core, MD 12/06/23 2316

## 2023-12-06 NOTE — ED Triage Notes (Signed)
Cough, chest congestion started Friday feeling worse since. Denies fever. Nonproductive cough

## 2023-12-06 NOTE — ED Notes (Signed)
Pt alert and oriented X 4 at the time of discharge. RR even and unlabored. No acute distress noted. Pt verbalized understanding of discharge instructions as discussed. Pt ambulatory to lobby at time of discharge.

## 2023-12-06 NOTE — Discharge Instructions (Signed)
Please follow-up with your primary care provider regards recent ER visit.  Today your labs show that you have influenza A which would be causing your symptoms.  You may take Tylenol every 6 hours needed for pain and I prescribed Tessalon to help with her cough.  Please drink plenty fluids any food as tolerated.  If symptoms change or worsen please return to the ER.

## 2023-12-19 ENCOUNTER — Encounter (HOSPITAL_COMMUNITY): Payer: Self-pay

## 2023-12-28 DIAGNOSIS — N185 Chronic kidney disease, stage 5: Secondary | ICD-10-CM | POA: Diagnosis not present

## 2024-01-12 DIAGNOSIS — E875 Hyperkalemia: Secondary | ICD-10-CM | POA: Diagnosis not present

## 2024-01-12 DIAGNOSIS — E785 Hyperlipidemia, unspecified: Secondary | ICD-10-CM | POA: Diagnosis not present

## 2024-01-12 DIAGNOSIS — N185 Chronic kidney disease, stage 5: Secondary | ICD-10-CM | POA: Diagnosis not present

## 2024-01-12 DIAGNOSIS — N057 Unspecified nephritic syndrome with diffuse crescentic glomerulonephritis: Secondary | ICD-10-CM | POA: Diagnosis not present

## 2024-01-12 DIAGNOSIS — K219 Gastro-esophageal reflux disease without esophagitis: Secondary | ICD-10-CM | POA: Diagnosis not present

## 2024-01-12 DIAGNOSIS — R942 Abnormal results of pulmonary function studies: Secondary | ICD-10-CM | POA: Diagnosis not present

## 2024-01-12 DIAGNOSIS — N2581 Secondary hyperparathyroidism of renal origin: Secondary | ICD-10-CM | POA: Diagnosis not present

## 2024-01-12 DIAGNOSIS — I120 Hypertensive chronic kidney disease with stage 5 chronic kidney disease or end stage renal disease: Secondary | ICD-10-CM | POA: Diagnosis not present

## 2024-01-12 DIAGNOSIS — M109 Gout, unspecified: Secondary | ICD-10-CM | POA: Diagnosis not present

## 2024-01-12 DIAGNOSIS — N058 Unspecified nephritic syndrome with other morphologic changes: Secondary | ICD-10-CM | POA: Diagnosis not present

## 2024-01-12 DIAGNOSIS — D631 Anemia in chronic kidney disease: Secondary | ICD-10-CM | POA: Diagnosis not present

## 2024-01-12 DIAGNOSIS — E872 Acidosis, unspecified: Secondary | ICD-10-CM | POA: Diagnosis not present

## 2024-02-22 DIAGNOSIS — N185 Chronic kidney disease, stage 5: Secondary | ICD-10-CM | POA: Diagnosis not present

## 2024-02-22 DIAGNOSIS — N189 Chronic kidney disease, unspecified: Secondary | ICD-10-CM | POA: Diagnosis not present

## 2024-02-29 DIAGNOSIS — N185 Chronic kidney disease, stage 5: Secondary | ICD-10-CM | POA: Diagnosis not present

## 2024-02-29 DIAGNOSIS — E875 Hyperkalemia: Secondary | ICD-10-CM | POA: Diagnosis not present

## 2024-02-29 DIAGNOSIS — N057 Unspecified nephritic syndrome with diffuse crescentic glomerulonephritis: Secondary | ICD-10-CM | POA: Diagnosis not present

## 2024-02-29 DIAGNOSIS — N058 Unspecified nephritic syndrome with other morphologic changes: Secondary | ICD-10-CM | POA: Diagnosis not present

## 2024-02-29 DIAGNOSIS — D631 Anemia in chronic kidney disease: Secondary | ICD-10-CM | POA: Diagnosis not present

## 2024-02-29 DIAGNOSIS — K219 Gastro-esophageal reflux disease without esophagitis: Secondary | ICD-10-CM | POA: Diagnosis not present

## 2024-02-29 DIAGNOSIS — M109 Gout, unspecified: Secondary | ICD-10-CM | POA: Diagnosis not present

## 2024-02-29 DIAGNOSIS — I120 Hypertensive chronic kidney disease with stage 5 chronic kidney disease or end stage renal disease: Secondary | ICD-10-CM | POA: Diagnosis not present

## 2024-02-29 DIAGNOSIS — N2581 Secondary hyperparathyroidism of renal origin: Secondary | ICD-10-CM | POA: Diagnosis not present

## 2024-02-29 DIAGNOSIS — R942 Abnormal results of pulmonary function studies: Secondary | ICD-10-CM | POA: Diagnosis not present

## 2024-02-29 DIAGNOSIS — E872 Acidosis, unspecified: Secondary | ICD-10-CM | POA: Diagnosis not present

## 2024-02-29 DIAGNOSIS — E785 Hyperlipidemia, unspecified: Secondary | ICD-10-CM | POA: Diagnosis not present

## 2024-03-07 ENCOUNTER — Telehealth (HOSPITAL_BASED_OUTPATIENT_CLINIC_OR_DEPARTMENT_OTHER): Payer: Self-pay | Admitting: Pulmonary Disease

## 2024-03-07 NOTE — Telephone Encounter (Signed)
 Patient requests [facility]ST records be released to Dr. Myrtis Ser. Form fill out and signed by patient for HST results to be released

## 2024-04-12 DIAGNOSIS — N185 Chronic kidney disease, stage 5: Secondary | ICD-10-CM | POA: Diagnosis not present

## 2024-04-16 ENCOUNTER — Telehealth (HOSPITAL_BASED_OUTPATIENT_CLINIC_OR_DEPARTMENT_OTHER): Payer: Self-pay | Admitting: Pulmonary Disease

## 2024-04-18 DIAGNOSIS — N185 Chronic kidney disease, stage 5: Secondary | ICD-10-CM | POA: Diagnosis not present

## 2024-04-18 DIAGNOSIS — D631 Anemia in chronic kidney disease: Secondary | ICD-10-CM | POA: Diagnosis not present

## 2024-04-18 DIAGNOSIS — E872 Acidosis, unspecified: Secondary | ICD-10-CM | POA: Diagnosis not present

## 2024-04-18 DIAGNOSIS — N057 Unspecified nephritic syndrome with diffuse crescentic glomerulonephritis: Secondary | ICD-10-CM | POA: Diagnosis not present

## 2024-04-18 DIAGNOSIS — E875 Hyperkalemia: Secondary | ICD-10-CM | POA: Diagnosis not present

## 2024-04-18 DIAGNOSIS — N2581 Secondary hyperparathyroidism of renal origin: Secondary | ICD-10-CM | POA: Diagnosis not present

## 2024-04-18 DIAGNOSIS — N058 Unspecified nephritic syndrome with other morphologic changes: Secondary | ICD-10-CM | POA: Diagnosis not present

## 2024-04-18 DIAGNOSIS — I10 Essential (primary) hypertension: Secondary | ICD-10-CM | POA: Diagnosis not present

## 2024-04-19 ENCOUNTER — Ambulatory Visit (HOSPITAL_BASED_OUTPATIENT_CLINIC_OR_DEPARTMENT_OTHER): Admitting: Pulmonary Disease

## 2024-04-19 ENCOUNTER — Encounter (HOSPITAL_BASED_OUTPATIENT_CLINIC_OR_DEPARTMENT_OTHER)

## 2024-05-28 ENCOUNTER — Encounter (HOSPITAL_BASED_OUTPATIENT_CLINIC_OR_DEPARTMENT_OTHER)

## 2024-05-28 ENCOUNTER — Ambulatory Visit (HOSPITAL_BASED_OUTPATIENT_CLINIC_OR_DEPARTMENT_OTHER): Admitting: Nurse Practitioner

## 2024-06-12 DIAGNOSIS — N185 Chronic kidney disease, stage 5: Secondary | ICD-10-CM | POA: Diagnosis not present

## 2024-06-14 DIAGNOSIS — K429 Umbilical hernia without obstruction or gangrene: Secondary | ICD-10-CM | POA: Diagnosis not present

## 2024-06-14 DIAGNOSIS — N058 Unspecified nephritic syndrome with other morphologic changes: Secondary | ICD-10-CM | POA: Diagnosis not present

## 2024-06-21 DIAGNOSIS — I10 Essential (primary) hypertension: Secondary | ICD-10-CM | POA: Diagnosis not present

## 2024-06-21 DIAGNOSIS — E872 Acidosis, unspecified: Secondary | ICD-10-CM | POA: Diagnosis not present

## 2024-06-21 DIAGNOSIS — D631 Anemia in chronic kidney disease: Secondary | ICD-10-CM | POA: Diagnosis not present

## 2024-06-21 DIAGNOSIS — N2581 Secondary hyperparathyroidism of renal origin: Secondary | ICD-10-CM | POA: Diagnosis not present

## 2024-06-21 DIAGNOSIS — N185 Chronic kidney disease, stage 5: Secondary | ICD-10-CM | POA: Diagnosis not present

## 2024-06-21 DIAGNOSIS — E875 Hyperkalemia: Secondary | ICD-10-CM | POA: Diagnosis not present

## 2024-06-21 DIAGNOSIS — N058 Unspecified nephritic syndrome with other morphologic changes: Secondary | ICD-10-CM | POA: Diagnosis not present

## 2024-06-21 DIAGNOSIS — N057 Unspecified nephritic syndrome with diffuse crescentic glomerulonephritis: Secondary | ICD-10-CM | POA: Diagnosis not present

## 2024-06-26 DIAGNOSIS — M175 Other unilateral secondary osteoarthritis of knee: Secondary | ICD-10-CM | POA: Diagnosis not present

## 2024-06-26 DIAGNOSIS — M545 Low back pain, unspecified: Secondary | ICD-10-CM | POA: Diagnosis not present

## 2024-06-26 DIAGNOSIS — M24871 Other specific joint derangements of right ankle, not elsewhere classified: Secondary | ICD-10-CM | POA: Diagnosis not present

## 2024-06-26 DIAGNOSIS — M25532 Pain in left wrist: Secondary | ICD-10-CM | POA: Diagnosis not present

## 2024-06-26 DIAGNOSIS — M24872 Other specific joint derangements of left ankle, not elsewhere classified: Secondary | ICD-10-CM | POA: Diagnosis not present

## 2024-06-26 DIAGNOSIS — M25531 Pain in right wrist: Secondary | ICD-10-CM | POA: Diagnosis not present

## 2024-08-21 ENCOUNTER — Encounter (HOSPITAL_COMMUNITY): Payer: Self-pay

## 2024-08-21 ENCOUNTER — Other Ambulatory Visit (HOSPITAL_BASED_OUTPATIENT_CLINIC_OR_DEPARTMENT_OTHER): Payer: Self-pay

## 2024-08-21 ENCOUNTER — Emergency Department (HOSPITAL_BASED_OUTPATIENT_CLINIC_OR_DEPARTMENT_OTHER)
Admission: EM | Admit: 2024-08-21 | Discharge: 2024-08-21 | Disposition: A | Discharge status: Home / Self Care | Attending: Emergency Medicine | Admitting: Emergency Medicine

## 2024-08-21 ENCOUNTER — Encounter (HOSPITAL_BASED_OUTPATIENT_CLINIC_OR_DEPARTMENT_OTHER): Payer: Self-pay

## 2024-08-21 ENCOUNTER — Other Ambulatory Visit: Payer: Self-pay

## 2024-08-21 DIAGNOSIS — N185 Chronic kidney disease, stage 5: Secondary | ICD-10-CM | POA: Diagnosis not present

## 2024-08-21 DIAGNOSIS — M79644 Pain in right finger(s): Secondary | ICD-10-CM | POA: Diagnosis not present

## 2024-08-21 DIAGNOSIS — Z79899 Other long term (current) drug therapy: Secondary | ICD-10-CM | POA: Insufficient documentation

## 2024-08-21 MED ORDER — CEPHALEXIN 500 MG PO CAPS
500.0000 mg | ORAL_CAPSULE | Freq: Four times a day (QID) | ORAL | 0 refills | Status: AC
  Filled 2024-08-21: qty 28, 7d supply, fill #0

## 2024-08-21 NOTE — Discharge Instructions (Signed)
 I do not see a significant infection related to your nail.  It appears as if your old nail is being slowly pushed out and a new nail taking its place.  Keep this area protected and clean.  Cut the end of your nail shorter if it is bothering you.  If you notice worsening redness, swelling, pain or drainage, start the cephalexin  and return to the emergency department.  You may also do warm soaks at home to see if this helps with any discomfort.

## 2024-08-21 NOTE — ED Triage Notes (Signed)
 Arrives POV with complaints of suspected nail infection to her right hand (middle finger) x1 month. Patient reports site became discolored. No fever or  chills.

## 2024-08-21 NOTE — ED Provider Notes (Signed)
 [place] EMERGENCY DEPARTMENT AT [facility] Provider Note   CSN: [id number] Arrival date & time: 08/21/24  1319     Patient presents with: Finger Issue   Cheryl [name] is a 78 y.o. female.   Patient presents to the emergency department today for evaluation of right middle finger pain.  She states that she may have had an injury about a month ago.  She developed a little hole in the nail and was able to express some fluid.  This improved.  Patient had her nails done today.  She had a fake nail that was removed.  She noticed deformity to the current nail.  No active drainage.  Minimal discomfort.  Patient wanted to have it checked because of the way that it looked.       Prior to Admission medications   Medication Sig Start Date End Date Taking? Authorizing Provider  cephALEXin  (KEFLEX ) 500 MG capsule Take 1 capsule (500 mg total) by mouth 4 (four) times daily. 08/21/24  Yes Parke Jandreau, PA-C  acetaminophen  (TYLENOL ) 500 MG tablet Take 500-1,000 mg by mouth as needed (for pain/headache).     [provider]  albuterol  (VENTOLIN  HFA) 108 (90 Base) MCG/ACT inhaler Inhale 2 puffs into the lungs every 6 (six) hours as needed for wheezing or shortness of breath. 01/06/22   Kassie Acquanetta Bradley, MD  allopurinol  (ZYLOPRIM ) 100 MG tablet Take 100 mg by mouth daily. 09/02/23   [provider]  amLODipine (NORVASC) 2.5 MG tablet Take 2.5 mg by mouth daily. 04/27/21   [provider]  benzonatate  (TESSALON ) 100 MG capsule Take 1 capsule (100 mg total) by mouth every 8 (eight) hours. 12/06/23   Victor Lynwood DASEN, PA-C  buPROPion (WELLBUTRIN XL) 300 MG 24 hr tablet Take 300 mg by mouth daily. 05/15/19   [provider]  calcitRIOL (ROCALTROL) 0.25 MCG capsule Take 0.25 mcg by mouth daily. 09/04/23   [provider]  clobetasol ointment (TEMOVATE) 0.05 %  12/22/20   [provider]  clotrimazole-betamethasone (LOTRISONE) cream Apply topically.     [provider]  colchicine 0.6 MG tablet Take 0.6 mg by mouth daily as needed.    [provider]  Cyanocobalamin  (VITAMIN B-12) 5000 MCG TBDP Take 1,000 mcg by mouth daily.     [provider]  Docusate Sodium  (DSS) 100 MG CAPS Take by mouth.    [provider]  fluticasone  (FLONASE ) 50 MCG/ACT nasal spray Place 2 sprays into both nostrils daily as needed for allergies or rhinitis.    [provider]  furosemide (LASIX) 80 MG tablet as needed. 05/26/20   [provider]  guaiFENesin  (MUCINEX ) 600 MG 12 hr tablet Take 1 tablet (600 mg total) by mouth 2 (two) times daily. 03/29/20   Pearlean Manus, MD  LINZESS  290 MCG CAPS capsule TAKE 1 CAPSULE BY MOUTH DAILY BEFORE BREAKFAST. 10/29/20   Rudy Josette RAMAN, PA-C  losartan (COZAAR) 25 MG tablet Take 12.5 mg by mouth at bedtime.    [provider]  metoprolol succinate (TOPROL-XL) 50 MG 24 hr tablet Take 50 mg by mouth daily. Take with or immediately following a meal.    [provider]  montelukast  (SINGULAIR ) 10 MG tablet Take 1 tablet (10 mg total) by mouth every morning. 03/29/20   Pearlean Manus, MD  nystatin (MYCOSTATIN) 100000 UNIT/ML suspension Take 5 mLs by mouth 2 (two) times daily as needed (for thrush).  02/22/20   [provider]  ondansetron  (ZOFRAN )  4 MG tablet Take 4 mg by mouth as needed for nausea or vomiting. 04/25/20   [provider]  pantoprazole  (PROTONIX ) 40 MG tablet TAKE 1 TABLET BY MOUTH 1 OR 2 TIMES DAILY BEFORE A MEAL FOR REFLUX 12/15/21   Shirlean Therisa ORN, NP  polyethylene glycol (MIRALAX  / GLYCOLAX ) packet Take 17 g by mouth daily as needed for mild constipation or moderate constipation.    [provider]  raloxifene  (EVISTA ) 60 MG tablet Take 60 mg by mouth daily.    [provider]  VELTASSA 8.4 g packet SMARTSIG:1 Packet(s) By Mouth 3 Times a Week 08/24/23   [provider]    Allergies: Prednisone , Tramadol ,  Aspirin, and Celecoxib    Review of Systems  Updated Vital Signs BP (!) 144/77 (BP Location: Left Arm)   Pulse 79   Temp 97.8 F (36.6 C) (Temporal)   Resp 20   Ht 4' 11 (1.499 m)   Wt 97.5 kg   SpO2 100%   BMI 43.42 kg/m   Physical Exam Vitals and nursing note reviewed.  Constitutional:      Appearance: She is well-developed.  HENT:     Head: Normocephalic and atraumatic.  Eyes:     Pupils: Pupils are equal, round, and reactive to light.  Cardiovascular:     Pulses: Normal pulses. No decreased pulses.  Musculoskeletal:        General: Tenderness present.     Cervical back: Normal range of motion and neck supple.     Comments: Right long finger: There is deformity of the nail.  The base of the nail appears to be pushing out from new nail growth underneath.  There is some mild erythema at the eponychial fold but no fluctuance.  No signs of paronychia/eponychia.  Patient with full range of motion of the digit.  No ulcerations.  Skin:    General: Skin is warm and dry.  Neurological:     Mental Status: She is alert.     Sensory: No sensory deficit.     Comments: Motor, sensation, and vascular distal to the injury is fully intact.   Psychiatric:        Mood and Affect: Mood normal.     (all labs ordered are listed, but only abnormal results are displayed) Labs Reviewed - No data to display  EKG: None  Radiology: No results found.   Procedures   Medications Ordered in the ED - No data to display  ED Course  Patient seen and examined. History obtained directly from patient.   Labs/EKG: None ordered  Imaging: None ordered  Medications/Fluids: None ordered  Most recent vital signs reviewed and are as follows: BP (!) 144/77 (BP Location: Left Arm)   Pulse 79   Temp 97.8 F (36.6 C) (Temporal)   Resp 20   Ht 4' 11 (1.499 m)   Wt 97.5 kg   SpO2 100%   BMI 43.42 kg/m   Initial impression: Patient's fingernail appears thickened and deformed, and  appears to be pushing out at the base from new nail growth.  Patient likely had an injury about a month ago and is in the process of losing the nail.  Per history, she may have had an infection earlier on, however no obvious eponychia or paronychia which would require drainage today.  I have prescribed Keflex  to use in case of worsening swelling.  We discussed conservative measures and expectant management.  I would expect her to lose this nail over  the next couple of weeks to a month or so.  Home treatment plan: Warm soaks, protect the area.  Keflex  for worsening swelling or pain.  Return instructions discussed with patient: New or worsening symptoms  Follow-up instructions discussed with patient: ED as needed, PCP as needed                                   Medical Decision Making Risk Prescription drug management.   Patient with right long finger nail issue.  Per history it seems that she had an injury and is in the process of losing the nail.  I do not see an eponychial or paronychia.  I do not suspect osteomyelitis or a septic joint.  Considered x-ray, but feel that this would not be helpful at this time.  Encouraged outpatient follow-up as needed.  We discussed signs and symptoms to return.  Patient given a course of Keflex  to use in case of worsening pain, redness or swelling --but do not feel strongly that she needs to use this immediately.     Final diagnoses:  Pain of finger of right hand    ED Discharge Orders          Ordered    cephALEXin  (KEFLEX ) 500 MG capsule  4 times daily        08/21/24 1558               Desiderio Chew, PA-C 08/21/24 1604    Doretha Folks, MD 08/23/24 2054

## 2024-08-28 DIAGNOSIS — N057 Unspecified nephritic syndrome with diffuse crescentic glomerulonephritis: Secondary | ICD-10-CM | POA: Diagnosis not present

## 2024-08-28 DIAGNOSIS — N185 Chronic kidney disease, stage 5: Secondary | ICD-10-CM | POA: Diagnosis not present

## 2024-08-28 DIAGNOSIS — N2581 Secondary hyperparathyroidism of renal origin: Secondary | ICD-10-CM | POA: Diagnosis not present

## 2024-08-28 DIAGNOSIS — E875 Hyperkalemia: Secondary | ICD-10-CM | POA: Diagnosis not present

## 2024-08-28 DIAGNOSIS — N058 Unspecified nephritic syndrome with other morphologic changes: Secondary | ICD-10-CM | POA: Diagnosis not present

## 2024-08-28 DIAGNOSIS — E872 Acidosis, unspecified: Secondary | ICD-10-CM | POA: Diagnosis not present

## 2024-08-28 DIAGNOSIS — D631 Anemia in chronic kidney disease: Secondary | ICD-10-CM | POA: Diagnosis not present

## 2024-08-31 ENCOUNTER — Other Ambulatory Visit (HOSPITAL_BASED_OUTPATIENT_CLINIC_OR_DEPARTMENT_OTHER): Payer: Self-pay
